# Patient Record
Sex: Female | Born: 1965 | Race: White | Hispanic: No | Marital: Married | Smoking: Never smoker
Health system: Southern US, Community
[De-identification: ages and names within clinical notes are randomized; demographics above are authoritative.]

## PROBLEM LIST (undated history)

## (undated) DIAGNOSIS — IMO0002 Reserved for concepts with insufficient information to code with codable children: Secondary | ICD-10-CM

## (undated) DIAGNOSIS — F99 Mental disorder, not otherwise specified: Secondary | ICD-10-CM

## (undated) DIAGNOSIS — G4730 Sleep apnea, unspecified: Secondary | ICD-10-CM

## (undated) DIAGNOSIS — F419 Anxiety disorder, unspecified: Secondary | ICD-10-CM

## (undated) DIAGNOSIS — R112 Nausea with vomiting, unspecified: Secondary | ICD-10-CM

## (undated) DIAGNOSIS — J189 Pneumonia, unspecified organism: Secondary | ICD-10-CM

## (undated) DIAGNOSIS — N200 Calculus of kidney: Secondary | ICD-10-CM

## (undated) DIAGNOSIS — R51 Headache: Secondary | ICD-10-CM

## (undated) DIAGNOSIS — G43909 Migraine, unspecified, not intractable, without status migrainosus: Secondary | ICD-10-CM

## (undated) DIAGNOSIS — F32A Depression, unspecified: Secondary | ICD-10-CM

## (undated) DIAGNOSIS — Z8719 Personal history of other diseases of the digestive system: Secondary | ICD-10-CM

## (undated) DIAGNOSIS — N289 Disorder of kidney and ureter, unspecified: Secondary | ICD-10-CM

## (undated) DIAGNOSIS — F329 Major depressive disorder, single episode, unspecified: Secondary | ICD-10-CM

## (undated) DIAGNOSIS — I499 Cardiac arrhythmia, unspecified: Secondary | ICD-10-CM

## (undated) DIAGNOSIS — F1910 Other psychoactive substance abuse, uncomplicated: Secondary | ICD-10-CM

## (undated) DIAGNOSIS — Z9889 Other specified postprocedural states: Secondary | ICD-10-CM

## (undated) DIAGNOSIS — M545 Low back pain, unspecified: Secondary | ICD-10-CM

## (undated) HISTORY — DX: Personal history of other diseases of the digestive system: Z87.19

## (undated) HISTORY — PX: BUNIONECTOMY: SHX129

## (undated) HISTORY — PX: ABDOMINAL HYSTERECTOMY: SHX81

## (undated) HISTORY — PX: VAGINAL HYSTERECTOMY: SHX2639

## (undated) HISTORY — PX: COLONOSCOPY: SHX174

## (undated) HISTORY — DX: Other psychoactive substance abuse, uncomplicated: F19.10

## (undated) HISTORY — PX: TOTAL ABDOMINAL HYSTERECTOMY W/ BILATERAL SALPINGOOPHORECTOMY: SHX83

## (undated) HISTORY — PX: FOOT SURGERY: SHX648

## (undated) HISTORY — PX: CHOLECYSTECTOMY: SHX55

## (undated) HISTORY — DX: Low back pain, unspecified: M54.50

---

## 2002-10-23 ENCOUNTER — Emergency Department (HOSPITAL_COMMUNITY): Admission: EM | Admit: 2002-10-23 | Discharge: 2002-10-23 | Payer: Self-pay | Admitting: *Deleted

## 2002-10-23 ENCOUNTER — Encounter: Payer: Self-pay | Admitting: Emergency Medicine

## 2002-10-29 ENCOUNTER — Emergency Department (HOSPITAL_COMMUNITY): Admission: EM | Admit: 2002-10-29 | Discharge: 2002-10-29 | Payer: Self-pay | Admitting: Emergency Medicine

## 2002-11-02 ENCOUNTER — Emergency Department (HOSPITAL_COMMUNITY): Admission: EM | Admit: 2002-11-02 | Discharge: 2002-11-02 | Payer: Self-pay | Admitting: Emergency Medicine

## 2002-11-28 ENCOUNTER — Emergency Department (HOSPITAL_COMMUNITY): Admission: EM | Admit: 2002-11-28 | Discharge: 2002-11-29 | Payer: Self-pay | Admitting: Emergency Medicine

## 2005-07-01 LAB — HM COLONOSCOPY: HM Colonoscopy: NORMAL

## 2009-03-05 ENCOUNTER — Ambulatory Visit: Payer: Self-pay | Admitting: Diagnostic Radiology

## 2009-03-05 ENCOUNTER — Emergency Department (HOSPITAL_BASED_OUTPATIENT_CLINIC_OR_DEPARTMENT_OTHER): Admission: EM | Admit: 2009-03-05 | Discharge: 2009-03-05 | Payer: Self-pay | Admitting: Emergency Medicine

## 2009-05-29 LAB — CONVERTED CEMR LAB: Pap Smear: NORMAL

## 2009-07-16 ENCOUNTER — Inpatient Hospital Stay (HOSPITAL_COMMUNITY): Admission: RE | Admit: 2009-07-16 | Discharge: 2009-07-18 | Payer: Self-pay | Admitting: Internal Medicine

## 2009-07-16 ENCOUNTER — Ambulatory Visit: Payer: Self-pay | Admitting: Diagnostic Radiology

## 2009-07-16 ENCOUNTER — Encounter: Payer: Self-pay | Admitting: Emergency Medicine

## 2009-07-17 ENCOUNTER — Encounter (INDEPENDENT_AMBULATORY_CARE_PROVIDER_SITE_OTHER): Payer: Self-pay | Admitting: Internal Medicine

## 2009-08-02 ENCOUNTER — Emergency Department (HOSPITAL_COMMUNITY): Admission: EM | Admit: 2009-08-02 | Discharge: 2009-08-02 | Payer: Self-pay | Admitting: Emergency Medicine

## 2009-08-02 ENCOUNTER — Emergency Department (HOSPITAL_BASED_OUTPATIENT_CLINIC_OR_DEPARTMENT_OTHER): Admission: EM | Admit: 2009-08-02 | Discharge: 2009-08-02 | Payer: Self-pay | Admitting: Emergency Medicine

## 2009-09-03 ENCOUNTER — Telehealth: Payer: Self-pay | Admitting: Internal Medicine

## 2009-09-03 ENCOUNTER — Ambulatory Visit: Payer: Self-pay | Admitting: Internal Medicine

## 2009-09-03 DIAGNOSIS — G4700 Insomnia, unspecified: Secondary | ICD-10-CM | POA: Insufficient documentation

## 2009-09-03 DIAGNOSIS — R519 Headache, unspecified: Secondary | ICD-10-CM | POA: Insufficient documentation

## 2009-09-03 DIAGNOSIS — R51 Headache: Secondary | ICD-10-CM

## 2009-09-25 ENCOUNTER — Ambulatory Visit: Payer: Self-pay | Admitting: Internal Medicine

## 2009-09-25 DIAGNOSIS — R002 Palpitations: Secondary | ICD-10-CM | POA: Insufficient documentation

## 2009-09-27 ENCOUNTER — Telehealth: Payer: Self-pay | Admitting: Internal Medicine

## 2009-09-28 ENCOUNTER — Encounter: Payer: Self-pay | Admitting: Internal Medicine

## 2009-09-28 LAB — CONVERTED CEMR LAB
Ferritin: 19 ng/mL (ref 10–291)
Saturation Ratios: 11 % — ABNORMAL LOW (ref 20–55)
TIBC: 367 ug/dL (ref 250–470)
UIBC: 325 ug/dL

## 2009-10-05 DIAGNOSIS — E870 Hyperosmolality and hypernatremia: Secondary | ICD-10-CM

## 2009-10-05 DIAGNOSIS — R079 Chest pain, unspecified: Secondary | ICD-10-CM

## 2009-10-05 DIAGNOSIS — Z87898 Personal history of other specified conditions: Secondary | ICD-10-CM

## 2009-10-05 DIAGNOSIS — E876 Hypokalemia: Secondary | ICD-10-CM

## 2009-11-29 ENCOUNTER — Ambulatory Visit (HOSPITAL_BASED_OUTPATIENT_CLINIC_OR_DEPARTMENT_OTHER): Admission: RE | Admit: 2009-11-29 | Discharge: 2009-11-29 | Payer: Self-pay | Admitting: Internal Medicine

## 2009-11-29 ENCOUNTER — Telehealth: Payer: Self-pay | Admitting: Internal Medicine

## 2009-11-29 ENCOUNTER — Ambulatory Visit: Payer: Self-pay | Admitting: Diagnostic Radiology

## 2009-11-29 ENCOUNTER — Ambulatory Visit: Payer: Self-pay | Admitting: Internal Medicine

## 2009-12-12 ENCOUNTER — Emergency Department (HOSPITAL_BASED_OUTPATIENT_CLINIC_OR_DEPARTMENT_OTHER): Admission: EM | Admit: 2009-12-12 | Discharge: 2009-12-12 | Payer: Self-pay | Admitting: Emergency Medicine

## 2009-12-12 ENCOUNTER — Ambulatory Visit: Payer: Self-pay | Admitting: Diagnostic Radiology

## 2009-12-18 ENCOUNTER — Encounter: Payer: Self-pay | Admitting: Cardiology

## 2010-01-03 ENCOUNTER — Encounter (INDEPENDENT_AMBULATORY_CARE_PROVIDER_SITE_OTHER): Payer: Self-pay | Admitting: *Deleted

## 2010-01-29 ENCOUNTER — Emergency Department (HOSPITAL_COMMUNITY)
Admission: EM | Admit: 2010-01-29 | Discharge: 2010-01-29 | Payer: Self-pay | Source: Home / Self Care | Admitting: Emergency Medicine

## 2010-02-28 ENCOUNTER — Emergency Department (HOSPITAL_BASED_OUTPATIENT_CLINIC_OR_DEPARTMENT_OTHER)
Admission: EM | Admit: 2010-02-28 | Discharge: 2010-03-01 | Payer: Self-pay | Source: Home / Self Care | Admitting: Emergency Medicine

## 2010-03-28 ENCOUNTER — Encounter
Admission: RE | Admit: 2010-03-28 | Discharge: 2010-04-16 | Payer: Self-pay | Source: Home / Self Care | Attending: Sports Medicine | Admitting: Sports Medicine

## 2010-04-14 LAB — CONVERTED CEMR LAB
BUN: 12 mg/dL (ref 6–23)
CO2: 25 meq/L (ref 19–32)
CO2: 27 meq/L (ref 19–32)
Calcium: 9.6 mg/dL (ref 8.4–10.5)
Chloride: 104 meq/L (ref 96–112)
Chloride: 105 meq/L (ref 96–112)
Creatinine, Ser: 0.89 mg/dL (ref 0.40–1.20)
Creatinine, Ser: 0.91 mg/dL (ref 0.40–1.20)
Free T4: 1.17 ng/dL (ref 0.80–1.80)
HCT: 34.7 % — ABNORMAL LOW (ref 36.0–46.0)
HCT: 39.2 % (ref 36.0–46.0)
Hemoglobin: 11.6 g/dL — ABNORMAL LOW (ref 12.0–15.0)
MCV: 83.9 fL (ref 78.0–100.0)
Platelets: 170 10*3/uL (ref 150–400)
Potassium: 4 meq/L (ref 3.5–5.3)
RBC: 4 M/uL (ref 3.87–5.11)
RDW: 12.2 % (ref 11.5–15.5)
RDW: 12.9 % (ref 11.5–15.5)
Sodium: 140 meq/L (ref 135–145)
WBC: 3.9 10*3/uL — ABNORMAL LOW (ref 4.0–10.5)
WBC: 4.5 10*3/uL (ref 4.0–10.5)

## 2010-04-16 NOTE — Progress Notes (Signed)
Summary: FAXED REQUEST FOR MEDICAL RECORDS TO DR August Saucer   Phone Note Outgoing Call   Call placed by: [facility] Call placed to: DR DEAN  Summary of Call: FAXED REQUEST FOR MEDICAL RECORDS  Initial call taken by: Roselle Locus,  September 03, 2009 2:53 PM

## 2010-04-16 NOTE — Progress Notes (Signed)
Summary: Lab Results  Phone Note Outgoing Call   Summary of Call: call pt - she has mild anemia.  other labs (electrolytes, kidney function and thyroid function test is normal)  (call lab and see if they can add iron studies - serum iron, tibc, and serum ferritin, b12 )  use 285.9 Initial call taken by: D. Thomos Lemons DO,  September 27, 2009 6:03 PM  Follow-up for Phone Call        call placed to [facility] customer service , spoke with Reola Calkins test have been added. Call placed to patient at [phone], no answer, voice recording stating messages/call can be accepted at this time, call disconnected Follow-up by: Glendell Docker CMA,  September 28, 2009 8:50 AM

## 2010-04-16 NOTE — Assessment & Plan Note (Signed)
Summary: new to est/mhf   Vital Signs:  Patient profile:   45 year old female Height:      65 inches Weight:      199.75 pounds BMI:     33.36 O2 Sat:      97 % on Room air Temp:     98.3 degrees F oral Pulse rate:   75 / minute Pulse rhythm:   regular Resp:     18 per minute BP sitting:   100 / 68  (right arm) Cuff size:   large  Vitals Entered By: Glendell Docker CMA (September 03, 2009 1:33 PM)  O2 Flow:  Room air CC: Rm 2- New patient Is Patient Diabetic? No Pain Assessment Patient in pain? no      Comments discuss sleeping problems, trouble falling and staying asleep, stopped taking bipolar meds after 6 years   Primary Care Provider:  Dondra Spry DO  CC:  Rm 2- New patient.  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  sleep is worse.  lost 20 lbs after stopping seroquel she prev on lithium, lamictal and zyprexa  ob gyn - interested in biodentical  Preventive Screening-Counseling & Management  Alcohol-Tobacco     Alcohol drinks/day: 0     Smoking Status: never  Caffeine-Diet-Exercise     Caffeine use/day: 2 -3 beverages daily     Does Patient Exercise: no  Allergies (verified): No Known Drug Allergies  Past History:  Past Medical History: Depression - Bipolar (prev followed by psych in [facility], [place]  Dr. 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   Head:  normocephalic and atraumatic.   Ears:  R ear normal and L ear normal.   Mouth:  pharynx pink and moist.   Neck:  No deformities, masses, or tenderness noted.no carotid bruits.   Lungs:  normal respiratory effort and normal breath sounds.   Heart:  normal rate, regular rhythm, and no gallop.   Abdomen:  soft, non-tender, normal bowel sounds, and no masses.   Extremities:  No lower extremity edema Neurologic:  cranial nerves II-XII intact and gait normal.   Psych:  normally interactive, good eye contact, not anxious appearing, and not depressed appearing.     Impression & Recommendations:  Problem # 1:  OTHER AND UNSPECIFIED BIPOLAR DISORDERS (ICD-296.89) hx of bipolar.  refer to psych for f/u Orders: Psychiatric Referral (Psych)  Problem # 2:  INSOMNIA, CHRONIC (ICD-307.42) insomnia may be related to menopause. trial of low  dose estrogen patch and gabapentin  Problem # 3:  FAMILY HISTORY BREAST CANCER 1ST DEGREE RELATIVE <50 (ICD-V16.3)  Orders: Mammogram (Screening) (Mammo)  Complete Medication List: 1)  Gabapentin 100 Mg Caps (Gabapentin) .... One by mouth qhs 2)  Estradiol 0.025 Mg/24hr Ptwk (Estradiol) .... Apply new patch once weekly  Patient Instructions: 1)  Please schedule a follow-up appointment in 1 month. Prescriptions: ESTRADIOL 0.025 MG/24HR PTWK (ESTRADIOL) apply new patch once weekly  #4 x 0   Entered and Authorized by:   D. Thomos Lemons DO   Signed by:   D. Thomos Lemons DO on 09/03/2009   Method used:   Electronically to          [id number]* (retail)       [address]       [place], [place]  [address]       Ph: [phone]       Fax: [phone]   RxID:   [id number] GABAPENTIN 100 MG CAPS (GABAPENTIN) one by mouth qhs  #30 x 0   Entered and Authorized by:   D. Thomos Lemons DO   Signed by:   D. Thomos Lemons DO on 09/03/2009   Method used:   Print then Give to Patient   RxID:   [id number]   Current Allergies (reviewed today): No known allergies    Preventive Care Screening  Pap Smear:    Date:  05/29/2009    Results:  normal   Colonoscopy:    Date:  07/01/2005    Results:  normal   Last Tetanus Booster:    Date:  08/30/2002    Results:  Historical

## 2010-04-16 NOTE — Miscellaneous (Signed)
  Clinical Lists Changes  Observations: Added new observation of PAST MED HX: CHEST PAIN EF 50-55%   echo... May, 2011 Mitral regurgitation   mild... echo... May, 2011 PALPITATIONS, OCCASIONAL MIGRAINES, HX OF HYPOKALEMIA  HYPERNATREMIA   HOT FLASHES  INSOMNIA, CHRONIC  OTHER AND UNSPECIFIED BIPOLAR DISORDERS  FAMILY HISTORY DIABETES 1ST DEGREE RELATIVE  FAMILY HISTORY OF COLON CA 1ST DEGREE RELATIVE <60  FAMILY HISTORY BREAST CANCER 1ST DEGREE RELATIVE <50 Depression - Bipolar (prev followed by psych in [facility], [place]  Dr. Chriss Czar) Migraine Headaches   (12/18/2009 16:26) Added new observation of PRIMARY MD: Dondra Spry DO (12/18/2009 16:26)       Past History:  Past Medical History: CHEST PAIN EF 50-55%   echo... May, 2011 Mitral regurgitation   mild... echo... May, 2011 PALPITATIONS, OCCASIONAL MIGRAINES, HX OF HYPOKALEMIA  HYPERNATREMIA   HOT FLASHES  INSOMNIA, CHRONIC  OTHER AND UNSPECIFIED BIPOLAR DISORDERS  FAMILY HISTORY DIABETES 1ST DEGREE RELATIVE  FAMILY HISTORY OF COLON CA 1ST DEGREE RELATIVE <60  FAMILY HISTORY BREAST CANCER 1ST DEGREE RELATIVE <50 Depression - Bipolar (prev followed by psych in [facility], [place]  Dr. Chriss Czar) Migraine Headaches

## 2010-04-16 NOTE — Letter (Signed)
   [place] at [facility] [address] [place], [place]  [address]  [place] Phone: [phone]      September 28, 2009   [place] [address] [place], [place] [address]  RE:  LAB RESULTS  Dear  Ms. Marcelli,  The following is an interpretation of your most recent lab tests.  Please take note of any instructions provided or changes to medications that have resulted from your lab work.  ELECTROLYTES:  Good - no changes needed  KIDNEY FUNCTION TESTS:  Good - no changes needed  THYROID STUDIES:  Thyroid studies normal TSH: 2.214     CBC:  Fair - review at your next visit  Your have mild anemia.  I suggest you take multivitamin that contains iron once daily.     I will further discuss your lab results at your next follow up appointment.     Sincerely Yours,    Dr. Thomos Lemons

## 2010-04-16 NOTE — Letter (Signed)
Summary: Appointment - Missed  [facility], Main Office  [address]   [place], [place] [address]   Phone: [phone]  Fax: [phone]     January 03, 2010 MRN: [id number]   [facility] [address] [place], [place]  [address]   Dear Ms. Auguste,  Our records indicate you missed your appointment on 12/20/09 with Dr. Myrtis Ser .It is very important that we reach you to reschedule this appointment. We look forward to participating in your health care needs. Please contact us at the number listed above at your earliest convenience to reschedule this appointment.     Sincerely,    Glass blower/designer

## 2010-04-16 NOTE — Progress Notes (Signed)
Summary: lab results  Phone Note Outgoing Call   Summary of Call: call pt - blood tests (blood counts, D Dimer (blood clot test),  electrolytes and kidney function is normal Initial call taken by: D. Thomos Lemons DO,  November 29, 2009 3:16 PM  Follow-up for Phone Call        Pt informed. Nicki Guadalajara Fergerson CMA Duncan Dull)  November 29, 2009 5:04 PM

## 2010-04-16 NOTE — Assessment & Plan Note (Signed)
Summary: 1 month follow up/mhf   Pt's husband NP @ 1:30pm  having car ...   Vital Signs:  Patient profile:   45 year old female Weight:      209.50 pounds BMI:     34.99 O2 Sat:      97 % on Room air Temp:     97.7 degrees F oral Pulse rate:   75 / minute Pulse rhythm:   regular Resp:     16 per minute BP sitting:   100 / 60  (right arm) Cuff size:   large  Vitals Entered By: Glendell Docker CMA (September 25, 2009 1:17 PM)  O2 Flow:  Room air CC: Rm 3- 1 Month Follow up , Palpitations Is Patient Diabetic? No Comments exercising 2-3 times a day, she states she loose her breath form time to time   Primary Care Provider:  D. Thomos Lemons DO  CC:  Rm 3- 1 Month Follow up  and Palpitations.  History of Present Illness: 45 year old white female for followup hot flashes - used estrogen patch.  she felt "fluish" feeling and stopped. gabapentin helping with hot flashes but still having issues with insomnia  Palpitations      This is a 45 year old woman who presents with Palpitations.  The patient reports dizziness and shortness of breath, but denies chest pain.  The palpitations are described as a sensation of the heart skipping beats.  The palpitations are intermittent.  not very frequent but symptoms are exertional. gets winded easily.  drinks sweet tea and Dr. Reino Kent  cardiac w/u completed in 07/2009 for pre syncope in hospital. 2 D Echo completed - diastolic dysfunction no stress test   Preventive Screening-Counseling & Management  Alcohol-Tobacco     Smoking Status: never  EKG  Procedure date:  09/25/2009  Findings:      Normal sinus rhythm with rate of:  63 bpm  Allergies (verified): No Known Drug Allergies  Past History:  Past Medical History: Depression - Bipolar (prev followed by psych in [facility], [place]  Dr. 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   Head:  normocephalic and atraumatic.   Neck:  No deformities, masses, or tenderness noted. Lungs:  normal respiratory effort and normal breath sounds.   Heart:  normal rate, regular rhythm, no murmur, and no gallop.   Extremities:  No lower extremity edema Neurologic:  cranial nerves II-XII intact and gait normal.   Psych:  normally interactive, good eye contact, not anxious appearing, and not depressed appearing.     Impression & Recommendations:  Problem # 1:  PALPITATIONS, OCCASIONAL (ICD-785.1) 45 y/o with intermittent palpitations.  symptms are exertiona.  check thyroid studies.  refer to cardiology for holter vs event recorder  Orders: T-Basic Metabolic Panel [phone]) T-CBC No Diff ([id number]) T-BNP  (B Natriuretic Peptide) [phone]) T-TSH [phone]) T-T4, Free [phone]) EKG w/  Interpretation (93000) Cardiology Referral (Cardiology)  Problem # 2:  INSOMNIA, CHRONIC (ICD-307.42) trazadone may be contributing to palpitations.  taper dose.   use higher dose of gabapentin  Complete Medication List: 1)  Gabapentin 100 Mg Caps (Gabapentin) .... One to three caps by mouth at bedtime 2)  Trazodone Hcl 100 Mg Tabs (Trazodone hcl) .... 2 tablet by mouth at bedtime  Patient Instructions: 1)  Please schedule a follow-up appointment in 2 months. Prescriptions: TRAZODONE HCL 100 MG TABS (TRAZODONE HCL) 2 tablet by mouth at bedtime  #30 x 0   Entered and  Authorized by:   D. Thomos Lemons DO   Signed by:   D. Thomos Lemons DO on 09/25/2009   Method used:   Electronically to        [id number]* (retail)       [address]       [place], [place]  [address]       Ph: [phone]       Fax: [phone]   RxID:   [id number] GABAPENTIN 100 MG CAPS (GABAPENTIN) one to three caps by mouth at bedtime  #90 x 2   Entered and Authorized by:   D. Thomos Lemons DO   Signed by:   D. Thomos Lemons DO on 09/25/2009   Method used:   Electronically to        [facility] #[id number]* (retail)       [address]       [place], [place]  [address]       Ph: [phone]       Fax: [phone]   RxID:   [id number]   Current Allergies (reviewed today): No known allergies

## 2010-04-16 NOTE — Assessment & Plan Note (Signed)
Summary: doesn't feel well/dt--Rm 3   Vital Signs:  Patient profile:   45 year old female Height:      65 inches Weight:      193.50 pounds BMI:     32.32 O2 Sat:      100 % on Room air Temp:     97.9 degrees F oral Pulse rate:   84 / minute Pulse rhythm:   regular Resp:     16 per minute BP sitting:   110 / 70  (right arm) Cuff size:   large  Vitals Entered By: Mervin Kung CMA Duncan Dull) (November 29, 2009 8:43 AM)  O2 Flow:  Room air CC: Rm 3   Also having chest pressure and dizziness x 2 weeks. Is Patient Diabetic? No Comments Pt states we took her off Trazadone. Only takes Gabapentin. Nicki Guadalajara Fergerson CMA Duncan Dull)  November 29, 2009 8:47 AM    Primary Care Provider:  Dondra Spry DO  CC:  Rm 3   Also having chest pressure and dizziness x 2 weeks.Marland Kitchen  History of Present Illness: 45 y/o white female c/o chest pressure " I have this pressure sensation that comes and goes".  some shortness of breath increased over the last 2 weeks  her symptoms are exertional. she reports symptoms while moving pallets / lifting no cough or wheezing to suggest infection no recent URI  she was prev referred to Dr. Jens Som but cancelled 3 appts    Preventive Screening-Counseling & Management  Alcohol-Tobacco     Alcohol drinks/day: 0     Smoking Status: never  Allergies (verified): No Known Drug Allergies  Past History:  Past Medical History: CHEST PAIN PALPITATIONS, OCCASIONAL MIGRAINES, HX OF HYPOKALEMIA  HYPERNATREMIA   HOT FLASHES  INSOMNIA, CHRONIC  OTHER AND UNSPECIFIED BIPOLAR DISORDERS  FAMILY HISTORY DIABETES 1ST DEGREE RELATIVE  FAMILY HISTORY OF COLON CA 1ST DEGREE RELATIVE <60  FAMILY HISTORY BREAST CANCER 1ST DEGREE RELATIVE <50 Depression - Bipolar (prev followed by psych in [facility], [place]  Dr. 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   Head:  normocephalic and atraumatic.   Eyes:  pupils equal, pupils round, and pupils reactive to light.   Mouth:  pharynx pink and moist.   Neck:  No deformities, masses, or tenderness noted.no carotid bruits.   Lungs:  normal respiratory effort, normal breath sounds, no crackles, and no wheezes.   Heart:  normal rate, regular rhythm, no murmur, and no gallop.   Abdomen:  soft, non-tender, normal bowel sounds, and no masses.   Extremities:  trace left pedal edema and trace right pedal edema.  no calf tenderness Neurologic:  cranial nerves II-XII intact.   Skin:  warm, dry Psych:  normally interactive, good eye contact, not anxious appearing, and not depressed appearing.  Impression & Recommendations:  Problem # 1:  CHEST PAIN (ICD-786.50) Pt canceled / rescheduled 3 prev cardiology referrals. pt had similar symptoms in May, 2011.  CT angio of chest was negative. rule out cardiac ischemia.  question anxiety EKG - NSR at 70 bpm Pt strongly urged to complete cardiac workup we discussed risks of CAD - including death  Orders: T-2 View CXR, Same Day (71020.5TC) T-Basic Metabolic Panel ([id number]) T-CBC No Diff ([id number]) T- * Misc. Laboratory test [id number]) Cardiology Referral (Cardiology)  Complete Medication List: 1)  Gabapentin 100 Mg Caps (Gabapentin) .... One to three caps by mouth at bedtime  Patient  Instructions: 1)  Our office will contact you re: cardiology referral 2)  If your symptoms get worse, please go to the emergency room.  Current Allergies (reviewed today): No known allergies

## 2010-05-28 LAB — CBC
HCT: 39.5 % (ref 36.0–46.0)
Hemoglobin: 13.8 g/dL (ref 12.0–15.0)
MCV: 85.3 fL (ref 78.0–100.0)
WBC: 6.6 10*3/uL (ref 4.0–10.5)

## 2010-05-28 LAB — URINALYSIS, ROUTINE W REFLEX MICROSCOPIC
Bilirubin Urine: NEGATIVE
Glucose, UA: NEGATIVE mg/dL
Hgb urine dipstick: NEGATIVE
Nitrite: NEGATIVE
Specific Gravity, Urine: 1.017 (ref 1.005–1.030)
pH: 8 (ref 5.0–8.0)

## 2010-05-28 LAB — BASIC METABOLIC PANEL
Chloride: 106 mEq/L (ref 96–112)
GFR calc non Af Amer: >60 mL/min (ref >60)
Potassium: 3.9 mEq/L (ref 3.5–5.1)
Sodium: 141 mEq/L (ref 135–145)

## 2010-05-28 LAB — D-DIMER, QUANTITATIVE: D-Dimer, Quant: <0.22 ug/mL-FEU (ref 0.00–0.48)

## 2010-05-28 LAB — URINE MICROSCOPIC-ADD ON

## 2010-05-28 LAB — POCT CARDIAC MARKERS
CKMB, poc: <1 ng/mL — ABNORMAL LOW (ref 1.0–8.0)
Troponin i, poc: <0.05 ng/mL (ref 0.00–0.09)

## 2010-05-28 LAB — DIFFERENTIAL
Basophils Absolute: 0 10*3/uL (ref 0.0–0.1)
Eosinophils Relative: 0 % (ref 0–5)
Lymphocytes Relative: 20 % (ref 12–46)
Lymphs Abs: 1.3 10*3/uL (ref 0.7–4.0)
Monocytes Absolute: 0.3 10*3/uL (ref 0.1–1.0)
Monocytes Relative: 5 % (ref 3–12)
Neutro Abs: 5 10*3/uL (ref 1.7–7.7)

## 2010-05-28 LAB — URINE CULTURE

## 2010-05-30 LAB — BASIC METABOLIC PANEL
BUN: 15 mg/dL (ref 6–23)
Calcium: 9.2 mg/dL (ref 8.4–10.5)
GFR calc non Af Amer: >60 mL/min (ref >60)
Glucose, Bld: 109 mg/dL — ABNORMAL HIGH (ref 70–99)
Sodium: 142 mEq/L (ref 135–145)

## 2010-05-30 LAB — POCT CARDIAC MARKERS: Myoglobin, poc: 26.3 ng/mL (ref 12–200)

## 2010-05-30 LAB — DIFFERENTIAL
Basophils Absolute: 0 10*3/uL (ref 0.0–0.1)
Basophils Relative: 1 % (ref 0–1)
Neutro Abs: 3.3 10*3/uL (ref 1.7–7.7)
Neutrophils Relative %: 62 % (ref 43–77)

## 2010-05-30 LAB — CBC
Hemoglobin: 12.6 g/dL (ref 12.0–15.0)
MCHC: 33.6 g/dL (ref 30.0–36.0)
RDW: 11.9 % (ref 11.5–15.5)

## 2010-06-04 LAB — COMPREHENSIVE METABOLIC PANEL
ALT: 42 U/L — ABNORMAL HIGH (ref 0–35)
AST: 26 U/L (ref 0–37)
Albumin: 3.7 g/dL (ref 3.5–5.2)
Alkaline Phosphatase: 63 U/L (ref 39–117)
BUN: 15 mg/dL (ref 6–23)
Chloride: 108 mEq/L (ref 96–112)
GFR calc Af Amer: >60 mL/min (ref >60)
Potassium: 3.4 mEq/L — ABNORMAL LOW (ref 3.5–5.1)
Sodium: 142 mEq/L (ref 135–145)
Total Protein: 5.9 g/dL — ABNORMAL LOW (ref 6.0–8.3)

## 2010-06-04 LAB — BASIC METABOLIC PANEL
BUN: 16 mg/dL (ref 6–23)
CO2: 29 mEq/L (ref 19–32)
Calcium: 9.3 mg/dL (ref 8.4–10.5)
Creatinine, Ser: 0.8 mg/dL (ref 0.4–1.2)
Creatinine, Ser: 0.83 mg/dL (ref 0.4–1.2)
GFR calc non Af Amer: >60 mL/min (ref >60)
Glucose, Bld: 128 mg/dL — ABNORMAL HIGH (ref 70–99)
Glucose, Bld: 97 mg/dL (ref 70–99)
Potassium: 3.8 mEq/L (ref 3.5–5.1)

## 2010-06-04 LAB — HEPATIC FUNCTION PANEL
AST: 32 U/L (ref 0–37)
Albumin: 3.8 g/dL (ref 3.5–5.2)
Total Bilirubin: 0.2 mg/dL — ABNORMAL LOW (ref 0.3–1.2)
Total Protein: 6.1 g/dL (ref 6.0–8.3)

## 2010-06-04 LAB — CARDIAC PANEL(CRET KIN+CKTOT+MB+TROPI)
CK, MB: 1.6 ng/mL (ref 0.3–4.0)
CK, MB: 3.3 ng/mL (ref 0.3–4.0)
Total CK: 108 U/L (ref 7–177)
Total CK: 143 U/L (ref 7–177)
Total CK: 194 U/L — ABNORMAL HIGH (ref 7–177)
Troponin I: 0.01 ng/mL (ref 0.00–0.06)

## 2010-06-04 LAB — CBC
HCT: 32.4 % — ABNORMAL LOW (ref 36.0–46.0)
MCHC: 34.1 g/dL (ref 30.0–36.0)
MCV: 87.5 fL (ref 78.0–100.0)
Platelets: 139 10*3/uL — ABNORMAL LOW (ref 150–400)
Platelets: 158 10*3/uL (ref 150–400)
RDW: 12.5 % (ref 11.5–15.5)
RDW: 13.3 % (ref 11.5–15.5)
RDW: 13.3 % (ref 11.5–15.5)
WBC: 4.8 10*3/uL (ref 4.0–10.5)

## 2010-06-04 LAB — LIPID PANEL
HDL: 44 mg/dL (ref >39)
LDL Cholesterol: 107 mg/dL — ABNORMAL HIGH (ref 0–99)
Total CHOL/HDL Ratio: 3.8 RATIO
Triglycerides: 84 mg/dL (ref <150)
VLDL: 17 mg/dL (ref 0–40)

## 2010-06-04 LAB — GLUCOSE, CAPILLARY: Glucose-Capillary: 113 mg/dL — ABNORMAL HIGH (ref 70–99)

## 2010-06-04 LAB — POCT CARDIAC MARKERS: Troponin i, poc: <0.05 ng/mL (ref 0.00–0.09)

## 2010-06-04 LAB — LIPASE, BLOOD: Lipase: 31 U/L (ref 11–59)

## 2010-06-04 LAB — D-DIMER, QUANTITATIVE: D-Dimer, Quant: <0.22 ug/mL-FEU (ref 0.00–0.48)

## 2010-06-17 LAB — URINE MICROSCOPIC-ADD ON

## 2010-06-17 LAB — CBC
Hemoglobin: 12.7 g/dL (ref 12.0–15.0)
RBC: 4.24 MIL/uL (ref 3.87–5.11)
RDW: 12 % (ref 11.5–15.5)

## 2010-06-17 LAB — URINALYSIS, ROUTINE W REFLEX MICROSCOPIC
Bilirubin Urine: NEGATIVE
Glucose, UA: NEGATIVE mg/dL
Ketones, ur: NEGATIVE mg/dL
pH: 5.5 (ref 5.0–8.0)

## 2010-06-17 LAB — BASIC METABOLIC PANEL
Calcium: 9.2 mg/dL (ref 8.4–10.5)
GFR calc Af Amer: >60 mL/min (ref >60)
GFR calc non Af Amer: >60 mL/min (ref >60)
Sodium: 142 mEq/L (ref 135–145)

## 2010-06-17 LAB — DIFFERENTIAL
Basophils Absolute: 0 10*3/uL (ref 0.0–0.1)
Lymphocytes Relative: 18 % (ref 12–46)
Monocytes Absolute: 0.2 10*3/uL (ref 0.1–1.0)
Monocytes Relative: 4 % (ref 3–12)
Neutro Abs: 3.3 10*3/uL (ref 1.7–7.7)

## 2010-07-24 ENCOUNTER — Emergency Department (INDEPENDENT_AMBULATORY_CARE_PROVIDER_SITE_OTHER): Payer: Medicare Other

## 2010-07-24 ENCOUNTER — Emergency Department (HOSPITAL_BASED_OUTPATIENT_CLINIC_OR_DEPARTMENT_OTHER)
Admission: EM | Admit: 2010-07-24 | Discharge: 2010-07-25 | Disposition: A | Discharge status: Home / Self Care | Payer: Medicare Other | Source: Home / Self Care | Attending: Emergency Medicine | Admitting: Emergency Medicine

## 2010-07-24 DIAGNOSIS — Z79899 Other long term (current) drug therapy: Secondary | ICD-10-CM | POA: Insufficient documentation

## 2010-07-24 DIAGNOSIS — R0602 Shortness of breath: Secondary | ICD-10-CM | POA: Insufficient documentation

## 2010-07-24 DIAGNOSIS — F319 Bipolar disorder, unspecified: Secondary | ICD-10-CM | POA: Insufficient documentation

## 2010-07-24 DIAGNOSIS — M25579 Pain in unspecified ankle and joints of unspecified foot: Secondary | ICD-10-CM | POA: Insufficient documentation

## 2010-07-24 LAB — CBC
HCT: 35.9 % — ABNORMAL LOW (ref 36.0–46.0)
MCV: 83.3 fL (ref 78.0–100.0)
RDW: 13 % (ref 11.5–15.5)
WBC: 9.5 10*3/uL (ref 4.0–10.5)

## 2010-07-24 LAB — BASIC METABOLIC PANEL
BUN: 18 mg/dL (ref 6–23)
CO2: 25 mEq/L (ref 19–32)
Chloride: 101 mEq/L (ref 96–112)
Glucose, Bld: 116 mg/dL — ABNORMAL HIGH (ref 70–99)
Potassium: 4.1 mEq/L (ref 3.5–5.1)

## 2010-07-24 LAB — DIFFERENTIAL
Eosinophils Relative: 0 % (ref 0–5)
Lymphocytes Relative: 9 % — ABNORMAL LOW (ref 12–46)
Lymphs Abs: 0.9 10*3/uL (ref 0.7–4.0)

## 2010-07-24 LAB — TROPONIN I: Troponin I: <0.3 ng/mL (ref <0.30)

## 2010-07-24 MED ORDER — IOHEXOL 350 MG/ML SOLN
80.0000 mL | Freq: Once | INTRAVENOUS | Status: AC | PRN
  Administered 2010-07-24: 80 mL via INTRAVENOUS

## 2010-07-25 ENCOUNTER — Emergency Department (HOSPITAL_BASED_OUTPATIENT_CLINIC_OR_DEPARTMENT_OTHER)
Admission: EM | Admit: 2010-07-25 | Discharge: 2010-07-25 | Disposition: A | Discharge status: Home / Self Care | Payer: Medicare Other | Source: Home / Self Care | Attending: Emergency Medicine | Admitting: Emergency Medicine

## 2010-07-25 DIAGNOSIS — R0602 Shortness of breath: Secondary | ICD-10-CM | POA: Insufficient documentation

## 2010-07-25 DIAGNOSIS — F319 Bipolar disorder, unspecified: Secondary | ICD-10-CM | POA: Insufficient documentation

## 2010-07-25 DIAGNOSIS — M25579 Pain in unspecified ankle and joints of unspecified foot: Secondary | ICD-10-CM | POA: Insufficient documentation

## 2010-07-25 DIAGNOSIS — R110 Nausea: Secondary | ICD-10-CM | POA: Insufficient documentation

## 2010-07-25 DIAGNOSIS — Z79899 Other long term (current) drug therapy: Secondary | ICD-10-CM | POA: Insufficient documentation

## 2010-07-26 ENCOUNTER — Emergency Department (HOSPITAL_COMMUNITY)
Admission: EM | Admit: 2010-07-26 | Discharge: 2010-07-26 | Disposition: A | Discharge status: Home / Self Care | Payer: Medicare Other | Attending: Emergency Medicine | Admitting: Emergency Medicine

## 2010-07-26 ENCOUNTER — Inpatient Hospital Stay (HOSPITAL_COMMUNITY)
Admission: AD | Admit: 2010-07-26 | Discharge: 2010-08-01 | DRG: 392 | Disposition: A | Discharge status: Home / Self Care | Payer: Medicare Other | Source: Ambulatory Visit | Attending: Orthopedic Surgery | Admitting: Orthopedic Surgery

## 2010-07-26 DIAGNOSIS — R0609 Other forms of dyspnea: Secondary | ICD-10-CM | POA: Insufficient documentation

## 2010-07-26 DIAGNOSIS — T40605A Adverse effect of unspecified narcotics, initial encounter: Secondary | ICD-10-CM | POA: Diagnosis present

## 2010-07-26 DIAGNOSIS — F411 Generalized anxiety disorder: Secondary | ICD-10-CM | POA: Insufficient documentation

## 2010-07-26 DIAGNOSIS — E876 Hypokalemia: Secondary | ICD-10-CM | POA: Diagnosis present

## 2010-07-26 DIAGNOSIS — M79609 Pain in unspecified limb: Secondary | ICD-10-CM | POA: Insufficient documentation

## 2010-07-26 DIAGNOSIS — F319 Bipolar disorder, unspecified: Secondary | ICD-10-CM | POA: Diagnosis present

## 2010-07-26 DIAGNOSIS — G8918 Other acute postprocedural pain: Secondary | ICD-10-CM | POA: Insufficient documentation

## 2010-07-26 DIAGNOSIS — R0989 Other specified symptoms and signs involving the circulatory and respiratory systems: Secondary | ICD-10-CM | POA: Insufficient documentation

## 2010-07-26 DIAGNOSIS — R0602 Shortness of breath: Secondary | ICD-10-CM | POA: Diagnosis present

## 2010-07-26 DIAGNOSIS — Z9889 Other specified postprocedural states: Secondary | ICD-10-CM

## 2010-07-26 DIAGNOSIS — R112 Nausea with vomiting, unspecified: Principal | ICD-10-CM | POA: Diagnosis present

## 2010-07-26 DIAGNOSIS — R079 Chest pain, unspecified: Secondary | ICD-10-CM | POA: Diagnosis present

## 2010-07-27 LAB — COMPREHENSIVE METABOLIC PANEL
ALT: 49 U/L — ABNORMAL HIGH (ref 0–35)
AST: 23 U/L (ref 0–37)
Alkaline Phosphatase: 65 U/L (ref 39–117)
CO2: 30 mEq/L (ref 19–32)
GFR calc non Af Amer: >60 mL/min (ref >60)
Glucose, Bld: 105 mg/dL — ABNORMAL HIGH (ref 70–99)
Potassium: 3.3 mEq/L — ABNORMAL LOW (ref 3.5–5.1)
Sodium: 138 mEq/L (ref 135–145)

## 2010-07-27 LAB — CBC
HCT: 30.2 % — ABNORMAL LOW (ref 36.0–46.0)
Hemoglobin: 10.3 g/dL — ABNORMAL LOW (ref 12.0–15.0)
RBC: 3.64 MIL/uL — ABNORMAL LOW (ref 3.87–5.11)
WBC: 4.6 10*3/uL (ref 4.0–10.5)

## 2010-07-30 ENCOUNTER — Inpatient Hospital Stay (HOSPITAL_COMMUNITY): Payer: Medicare Other

## 2010-07-30 LAB — CARDIAC PANEL(CRET KIN+CKTOT+MB+TROPI): CK, MB: 0.8 ng/mL (ref 0.3–4.0)

## 2010-07-31 LAB — CBC
MCH: 28.8 pg (ref 26.0–34.0)
MCHC: 35.3 g/dL (ref 30.0–36.0)
Platelets: 125 10*3/uL — ABNORMAL LOW (ref 150–400)
RDW: 12.8 % (ref 11.5–15.5)

## 2010-07-31 LAB — DIFFERENTIAL
Basophils Absolute: 0 10*3/uL (ref 0.0–0.1)
Basophils Relative: 0 % (ref 0–1)
Eosinophils Absolute: 0 10*3/uL (ref 0.0–0.7)
Eosinophils Relative: 0 % (ref 0–5)
Monocytes Absolute: 0.2 10*3/uL (ref 0.1–1.0)
Monocytes Relative: 6 % (ref 3–12)
Neutrophils Relative %: 58 % (ref 43–77)

## 2010-07-31 LAB — CARDIAC PANEL(CRET KIN+CKTOT+MB+TROPI)
Relative Index: INVALID (ref 0.0–2.5)
Total CK: 45 U/L (ref 7–177)
Troponin I: <0.3 ng/mL (ref <0.30)

## 2010-07-31 LAB — COMPREHENSIVE METABOLIC PANEL
AST: 31 U/L (ref 0–37)
BUN: 3 mg/dL — ABNORMAL LOW (ref 6–23)
CO2: 31 mEq/L (ref 19–32)
Chloride: 106 mEq/L (ref 96–112)
Creatinine, Ser: 0.56 mg/dL (ref 0.4–1.2)
GFR calc Af Amer: >60 mL/min (ref >60)
GFR calc non Af Amer: >60 mL/min (ref >60)
Glucose, Bld: 105 mg/dL — ABNORMAL HIGH (ref 70–99)
Total Bilirubin: 0.8 mg/dL (ref 0.3–1.2)

## 2010-07-31 LAB — AMYLASE: Amylase: 25 U/L (ref 0–105)

## 2010-07-31 LAB — HEMOGLOBIN A1C: Hgb A1c MFr Bld: 5.1 % (ref <5.7)

## 2010-08-01 LAB — BASIC METABOLIC PANEL
Chloride: 106 mEq/L (ref 96–112)
GFR calc non Af Amer: >60 mL/min (ref >60)
Glucose, Bld: 102 mg/dL — ABNORMAL HIGH (ref 70–99)
Potassium: 3.7 mEq/L (ref 3.5–5.1)
Sodium: 139 mEq/L (ref 135–145)

## 2010-08-19 ENCOUNTER — Emergency Department (HOSPITAL_BASED_OUTPATIENT_CLINIC_OR_DEPARTMENT_OTHER)
Admission: EM | Admit: 2010-08-19 | Discharge: 2010-08-19 | Disposition: A | Discharge status: Home / Self Care | Payer: Medicare Other | Attending: Emergency Medicine | Admitting: Emergency Medicine

## 2010-08-19 DIAGNOSIS — G8918 Other acute postprocedural pain: Secondary | ICD-10-CM | POA: Insufficient documentation

## 2010-08-19 DIAGNOSIS — F319 Bipolar disorder, unspecified: Secondary | ICD-10-CM | POA: Insufficient documentation

## 2010-08-19 DIAGNOSIS — Z79899 Other long term (current) drug therapy: Secondary | ICD-10-CM | POA: Insufficient documentation

## 2010-08-20 ENCOUNTER — Emergency Department (HOSPITAL_COMMUNITY)
Admission: EM | Admit: 2010-08-20 | Discharge: 2010-08-20 | Disposition: A | Discharge status: Home / Self Care | Payer: Medicare Other | Attending: Emergency Medicine | Admitting: Emergency Medicine

## 2010-08-20 DIAGNOSIS — G8929 Other chronic pain: Secondary | ICD-10-CM | POA: Insufficient documentation

## 2010-08-20 DIAGNOSIS — R112 Nausea with vomiting, unspecified: Secondary | ICD-10-CM | POA: Insufficient documentation

## 2010-08-20 DIAGNOSIS — Z79899 Other long term (current) drug therapy: Secondary | ICD-10-CM | POA: Insufficient documentation

## 2010-09-26 ENCOUNTER — Emergency Department (HOSPITAL_BASED_OUTPATIENT_CLINIC_OR_DEPARTMENT_OTHER)
Admission: EM | Admit: 2010-09-26 | Discharge: 2010-09-27 | Disposition: A | Discharge status: Home / Self Care | Payer: PRIVATE HEALTH INSURANCE | Attending: Emergency Medicine | Admitting: Emergency Medicine

## 2010-09-26 ENCOUNTER — Encounter: Payer: Self-pay | Admitting: *Deleted

## 2010-09-26 DIAGNOSIS — R5381 Other malaise: Secondary | ICD-10-CM | POA: Insufficient documentation

## 2010-09-26 DIAGNOSIS — R509 Fever, unspecified: Secondary | ICD-10-CM | POA: Insufficient documentation

## 2010-09-26 DIAGNOSIS — R52 Pain, unspecified: Secondary | ICD-10-CM | POA: Insufficient documentation

## 2010-09-26 DIAGNOSIS — R112 Nausea with vomiting, unspecified: Secondary | ICD-10-CM | POA: Insufficient documentation

## 2010-09-26 DIAGNOSIS — R51 Headache: Secondary | ICD-10-CM | POA: Insufficient documentation

## 2010-09-26 DIAGNOSIS — M5430 Sciatica, unspecified side: Secondary | ICD-10-CM

## 2010-09-26 DIAGNOSIS — N390 Urinary tract infection, site not specified: Secondary | ICD-10-CM | POA: Insufficient documentation

## 2010-09-26 DIAGNOSIS — R110 Nausea: Secondary | ICD-10-CM

## 2010-09-26 HISTORY — DX: Headache: R51

## 2010-09-26 LAB — URINE MICROSCOPIC-ADD ON

## 2010-09-26 LAB — URINALYSIS, ROUTINE W REFLEX MICROSCOPIC
Bilirubin Urine: NEGATIVE
Ketones, ur: NEGATIVE mg/dL
Nitrite: NEGATIVE
Urobilinogen, UA: 0.2 mg/dL (ref 0.0–1.0)
pH: 5.5 (ref 5.0–8.0)

## 2010-09-26 LAB — DIFFERENTIAL
Lymphs Abs: 1.5 10*3/uL (ref 0.7–4.0)
Monocytes Relative: 5 % (ref 3–12)
Neutro Abs: 5 10*3/uL (ref 1.7–7.7)
Neutrophils Relative %: 73 % (ref 43–77)

## 2010-09-26 LAB — CBC
Hemoglobin: 11.3 g/dL — ABNORMAL LOW (ref 12.0–15.0)
MCH: 29.2 pg (ref 26.0–34.0)
RBC: 3.87 MIL/uL (ref 3.87–5.11)

## 2010-09-26 LAB — RAPID STREP SCREEN (MED CTR MEBANE ONLY): Streptococcus, Group A Screen (Direct): NEGATIVE

## 2010-09-26 MED ORDER — KETOROLAC TROMETHAMINE 30 MG/ML IJ SOLN
30.0000 mg | Freq: Once | INTRAMUSCULAR | Status: AC
  Administered 2010-09-27: 30 mg via INTRAVENOUS
  Filled 2010-09-26: qty 1

## 2010-09-26 MED ORDER — HYDROMORPHONE HCL 1 MG/ML IJ SOLN
1.0000 mg | Freq: Once | INTRAMUSCULAR | Status: DC
  Filled 2010-09-26: qty 1

## 2010-09-26 MED ORDER — DIPHENHYDRAMINE HCL 50 MG/ML IJ SOLN
25.0000 mg | Freq: Once | INTRAMUSCULAR | Status: AC
  Administered 2010-09-27: via INTRAVENOUS
  Filled 2010-09-26: qty 1

## 2010-09-26 MED ORDER — PROMETHAZINE HCL 25 MG/ML IJ SOLN
25.0000 mg | Freq: Once | INTRAMUSCULAR | Status: AC
  Administered 2010-09-27: 25 mg via INTRAVENOUS
  Filled 2010-09-26: qty 1

## 2010-09-26 MED ORDER — SODIUM CHLORIDE 0.9 % IV SOLN
Freq: Once | INTRAVENOUS | Status: AC
  Administered 2010-09-27: 1000 mL via INTRAVENOUS

## 2010-09-26 NOTE — ED Provider Notes (Addendum)
History     Chief Complaint  Patient presents with  . Fever  . Generalized Body Aches   Patient is a 45 y.o. female presenting with fever.  Fever Primary symptoms of the febrile illness include fever, fatigue, headaches, nausea, vomiting and myalgias. Primary symptoms do not include visual change, cough, wheezing, shortness of breath, abdominal pain, diarrhea, dysuria, altered mental status or rash. The current episode started today. The problem has not changed since onset. The fever began today. The fever has been resolved since its onset. The maximum temperature recorded prior to her arrival was 100 to 100.9 F. The temperature was taken by an oral thermometer.  The fatigue began today.  The headache began today. The headache developed gradually. Headache is a new problem. The headache is present continuously. The headache is associated with photophobia. The headache is not associated with aura, double vision, eye pain, decreased vision, visual change, stiff neck, neck stiffness or paresthesias.  Nausea began today.  The vomiting began today. Vomiting occurred once. The emesis contains stomach contents.  Myalgias began today. The myalgias have been unchanged since their onset. The myalgias are generalized. The myalgias are aching. The discomfort from the myalgias is moderate. The myalgias are not associated with tenderness or swelling. Primary symptoms comment: "sciatica" - the patient reports onset of lower left sided back pain earlier today while at the grocery store sitting down on a shopping cart. She denies any preceding injury either blunt or due to lifting. She reports that she has a history of sciatica,.   the patient reports that her sciatica is the most troubling of the symptoms she presents with today. She follows this with generalized headache and nausea is her second most troublesome symptom for which she presents. She reports one episode of emesis earlier today that was yellow and  watery without blood. Otherwise she reports a sore throat, mild, since this morning when she woke up. She reports that earlier today when she saw her doctor her temperature was 100.70F. She otherwise denies rash, joint pain, or neck pain/stiffness.  Past Medical History  Diagnosis Date  . Bipolar 1 disorder   . Headache     Past Surgical History  Procedure Date  . Vaginal hysterectomy   . Cholecystectomy     No family history on file.  History  Substance Use Topics  . Smoking status: Never Smoker   . Smokeless tobacco: Not on file  . Alcohol Use: No    OB History    Grav Para Term Preterm Abortions TAB SAB Ect Mult Living                  Review of Systems  Constitutional: Positive for fever and fatigue. Negative for chills.  HENT: Positive for sore throat. Negative for congestion, rhinorrhea, trouble swallowing, neck pain, neck stiffness and voice change.   Eyes: Positive for photophobia. Negative for double vision and pain.  Respiratory: Negative for cough, chest tightness, shortness of breath and wheezing.   Cardiovascular: Negative for chest pain, palpitations and leg swelling.  Gastrointestinal: Positive for nausea and vomiting. Negative for abdominal pain, diarrhea, constipation and abdominal distention.  Genitourinary: Negative for dysuria, frequency and flank pain.  Musculoskeletal: Positive for myalgias and back pain.  Skin: Negative for pallor and rash.  Neurological: Positive for headaches. Negative for numbness and paresthesias.  Hematological: Negative for adenopathy.  Psychiatric/Behavioral: Negative for confusion and altered mental status. The patient is nervous/anxious.     Physical Exam  BP  122/66  Pulse 116  Temp(Src) 99.8 F (37.7 C) (Oral)  Resp 19  SpO2 98%  Physical Exam  Nursing note and vitals reviewed. Constitutional: She is oriented to person, place, and time. She appears well-developed and well-nourished. She appears distressed.  HENT:   Head: Normocephalic and atraumatic.  Right Ear: External ear normal.  Left Ear: External ear normal.  Nose: Nose normal.  Mouth/Throat: Oropharynx is clear and moist. No oropharyngeal exudate.  Eyes: Conjunctivae and EOM are normal. Pupils are equal, round, and reactive to light.  Neck: Normal range of motion and full passive range of motion without pain. Neck supple. No rigidity. No Brudzinski's sign noted.  Cardiovascular: Regular rhythm, S1 normal, S2 normal, normal heart sounds and intact distal pulses.  Tachycardia present.  Exam reveals no gallop and no friction rub.   No murmur heard. Pulmonary/Chest: Effort normal and breath sounds normal. No respiratory distress. She has no wheezes. She has no rales.  Abdominal: Soft. Bowel sounds are normal. She exhibits no distension. There is no tenderness. There is no guarding.  Musculoskeletal: Normal range of motion. She exhibits no edema and no tenderness.       Back:  Lymphadenopathy:    She has no cervical adenopathy.  Neurological: She is alert and oriented to person, place, and time. She has normal reflexes. No cranial nerve deficit. She exhibits normal muscle tone. Coordination normal.  Skin: Skin is warm and dry. No rash noted. She is not diaphoretic. No erythema.    ED Course  Procedures The patient reports that she is feeling much better after medications given in the ED. It appears that she has a urinary tract infection but no streptococcal pharyngitis. I will discharge her home at this time.  MDM Differential diagnosis for the patient includes sciatica, tension headache, gastroenteritis, strep throat, viral syndrome, dehydration, urinary tract infection among other etiologies considered.      Felisa Bonier, MD 09/27/10 0000  Felisa Bonier, MD 09/27/10 [phone]

## 2010-09-26 NOTE — ED Notes (Signed)
Pt. C/o general aching and fever that started today. Also c/o of "sciatica"  That started today while at the grocery store. Denies any heavy lifting. States pain radiating to left lower extremities.  Pt. States she saw her pcp today for sore throat and nausea. States vomited times one pta. C/o nausea at present. Denies any cough. resp even and unlabored. No distress.  Has not taken any meds pta.

## 2010-09-27 LAB — COMPREHENSIVE METABOLIC PANEL
Alkaline Phosphatase: 91 U/L (ref 39–117)
BUN: 10 mg/dL (ref 6–23)
CO2: 28 mEq/L (ref 19–32)
Chloride: 103 mEq/L (ref 96–112)
GFR calc Af Amer: >60 mL/min (ref >60)
Glucose, Bld: 110 mg/dL — ABNORMAL HIGH (ref 70–99)
Potassium: 4.2 mEq/L (ref 3.5–5.1)
Total Bilirubin: 0.4 mg/dL (ref 0.3–1.2)

## 2010-09-27 LAB — LIPASE, BLOOD: Lipase: 18 U/L (ref 11–59)

## 2010-09-27 MED ORDER — OXYCODONE-ACETAMINOPHEN 5-325 MG PO TABS: 2.0000 | ORAL_TABLET | ORAL | Status: AC | PRN

## 2010-09-27 MED ORDER — PROMETHAZINE HCL 25 MG PO TABS: 25.0000 mg | ORAL_TABLET | Freq: Four times a day (QID) | ORAL | Status: DC | PRN

## 2010-09-27 MED ORDER — SULFAMETHOXAZOLE-TRIMETHOPRIM 800-160 MG PO TABS: 1.0000 | ORAL_TABLET | Freq: Two times a day (BID) | ORAL | Status: AC

## 2010-09-30 NOTE — Discharge Summary (Signed)
[name], [name]                ACCOUNT NO.:  [id number]  MEDICAL RECORD NO.:  [id number]  LOCATION:                                 FACILITY:  PHYSICIAN:  Leonides Grills, M.D.     DATE OF BIRTH:  [birth year]  DATE OF ADMISSION: DATE OF DISCHARGE:                        DISCHARGE SUMMARY - REFERRING   ADMITTING DIAGNOSES: 1. Bipolar. 2. Chronic nausea. 3. Asthma. 4. Chest pain, shortness of breath.  DISCHARGE DIAGNOSES: 1. Bipolar disorder. 2. Asthma. 3. Chronic nausea. 4. Negative workup for chest pain and shortness of breath.  HISTORY OF PRESENT ILLNESS:  This is a 45 year old female with a history of bipolar disorder, history of asthma, history of nausea in the past. The patient is status post right  Kidner procedure, right FDL 2, navicular tendon transfer, right gastroc, right ankle arthroscopy with extensive debridement on Jul 17, 2010.  Since her surgery, the patient has been admitted through the ER to rule out PE and coronary artery disease.  She had full investigation of both the PE and the cardiac disease and had a negative CT angiogram for PE and 2-D echo which showed mild LVH, systolic function within normal limits, EF of 50% to 55% and was discharged home.  The patient most recently was admitted to hospital on Jul 27, 2010, for difficulty with pain control, persistent nausea and vomiting.  The patient also stated that she had had shortness of breath and chest pain.  CONSULTS:  Following consults were obtained while the patient was hospitalized:  Hospitalist consult.  HOSPITAL COURSE:  The patient was admitted on Jul 27, 2010, due to uncontrolled right lower extremity pain, nausea and vomiting. Throughout her hospital stay, the patient remained afebrile, vital signs stable.  Chest x-ray was performed and it showed some mild atelectasis. KUB was also performed and it showed no evidence of obstruction.  The patient did develop hypokalemia during the hospital  stay and potassium was replaced.  Hospital day #4, the patient was afebrile, vital signs stable, potassium 3.7, nausea and pain under control.  The patient tolerating diet well.  The patient was discharged to home.  LABORATORY DATA:  Routine labs on admission; white count of 4600, hemoglobin 10.3, hematocrit 30.2, and platelets were 150,000.  On Jul 31, 2010, CBC, white count 3400, hemoglobin 9.8, hematocrit 27.8, platelets 125.  BMET on admission, sodium 138, potassium 3.3, chloride 101, bicarb 30, glucose 105, BUN 7, creatinine 0.6.  Aug 01, 2010, sodium 139, potassium 3.7, chloride 106, bicarb 30, glucose 102, BUN 16, creatinine 0.62.  Cardiac enzymes, Jul 30, 2010, CK was 53, CK-MB 0.8, troponin less than 0.3.  RADIOGRAPHS:  Chest x-ray dated Jul 30, 2010, showed left basilar atelectasis versus infiltrate, no pleural effusion, no pulmonary edema and heart size within normal limits.  Abdominal x-ray dated Jul 30, 2010, showed mild colonic distention. Note that this portion and dilation of small bowel suggests obstruction.  EKG dated Jul 30, 2010, showed normal sinus rhythm, rate was 70, PR interval was 168 milliseconds, PRT axis is 27 negative for 3.  DISCHARGE INSTRUCTIONS:  DISCHARGE MEDICATIONS:  The patient's new meds were: 1. Colace 100 mg b.i.d. 2. Oxycodone 5/325 one  to two p.o. q.4-6 h p.r.n. 3. Protonix 40 mg p.o. daily. 4. She is to remain on aspirin, gabapentin, Phenergan, and Seroquel.  DIET:  Regular diet.  No restrictions.  Weightbearing, she is non-weightbearing on the right leg.  WOUND CARE:  She is to keep the cast clean, dry and intact.  Cast was changed during the hospital stay.  Jones dressing was removed.  Sutures were removed, Steri-Strips applied, a  new short-leg cast was applied to the patient's leg.  FOLLOWUP:  The patient is to follow up Dr. Lestine Box in 1 month.  The patient to call [phone] for appointment.  CONDITION ON DISCHARGE:  The  patient was discharged to home in good and stable condition.     Richardean Canal, P.A.   ______________________________ Leonides Grills, M.D.    GC/MEDQ  D:  08/27/2010  T:  08/27/2010  Job:  [id number]  cc:   Leonides Grills, M.D. Fax: [phone]  Electronically Signed by Richardean Canal P.A. on 09/04/2010 02:20:02 AM Electronically Signed by Leonides Grills M.D. on 09/30/2010 05:53:04 PM

## 2010-10-06 ENCOUNTER — Inpatient Hospital Stay (INDEPENDENT_AMBULATORY_CARE_PROVIDER_SITE_OTHER)
Admission: RE | Admit: 2010-10-06 | Discharge: 2010-10-06 | Disposition: A | Discharge status: Home / Self Care | Payer: PRIVATE HEALTH INSURANCE | Source: Ambulatory Visit | Attending: Family Medicine | Admitting: Family Medicine

## 2010-10-06 DIAGNOSIS — M5430 Sciatica, unspecified side: Secondary | ICD-10-CM

## 2010-10-07 ENCOUNTER — Encounter (HOSPITAL_BASED_OUTPATIENT_CLINIC_OR_DEPARTMENT_OTHER): Payer: Self-pay | Admitting: Student

## 2010-10-07 ENCOUNTER — Encounter (HOSPITAL_BASED_OUTPATIENT_CLINIC_OR_DEPARTMENT_OTHER): Payer: Self-pay | Admitting: *Deleted

## 2010-10-07 ENCOUNTER — Emergency Department (HOSPITAL_BASED_OUTPATIENT_CLINIC_OR_DEPARTMENT_OTHER)
Admission: EM | Admit: 2010-10-07 | Discharge: 2010-10-07 | Disposition: A | Discharge status: Home / Self Care | Payer: PRIVATE HEALTH INSURANCE | Attending: Emergency Medicine | Admitting: Emergency Medicine

## 2010-10-07 ENCOUNTER — Emergency Department (HOSPITAL_BASED_OUTPATIENT_CLINIC_OR_DEPARTMENT_OTHER)
Admission: EM | Admit: 2010-10-07 | Discharge: 2010-10-07 | Disposition: A | Discharge status: Home / Self Care | Payer: No Typology Code available for payment source | Attending: Emergency Medicine | Admitting: Emergency Medicine

## 2010-10-07 DIAGNOSIS — G8929 Other chronic pain: Secondary | ICD-10-CM | POA: Insufficient documentation

## 2010-10-07 DIAGNOSIS — N390 Urinary tract infection, site not specified: Secondary | ICD-10-CM | POA: Insufficient documentation

## 2010-10-07 DIAGNOSIS — F319 Bipolar disorder, unspecified: Secondary | ICD-10-CM | POA: Insufficient documentation

## 2010-10-07 DIAGNOSIS — R112 Nausea with vomiting, unspecified: Secondary | ICD-10-CM | POA: Insufficient documentation

## 2010-10-07 DIAGNOSIS — R110 Nausea: Secondary | ICD-10-CM | POA: Insufficient documentation

## 2010-10-07 LAB — URINALYSIS, ROUTINE W REFLEX MICROSCOPIC
Glucose, UA: NEGATIVE mg/dL
Hgb urine dipstick: NEGATIVE
Ketones, ur: 15 mg/dL — AB
Protein, ur: NEGATIVE mg/dL
Urobilinogen, UA: 0.2 mg/dL (ref 0.0–1.0)

## 2010-10-07 LAB — URINE MICROSCOPIC-ADD ON

## 2010-10-07 MED ORDER — ONDANSETRON HCL 4 MG PO TABS: 8.0000 mg | ORAL_TABLET | Freq: Three times a day (TID) | ORAL | Status: AC | PRN

## 2010-10-07 MED ORDER — PROMETHAZINE HCL 25 MG/ML IJ SOLN
25.0000 mg | Freq: Once | INTRAMUSCULAR | Status: AC
  Administered 2010-10-07: 25 mg via INTRAMUSCULAR
  Filled 2010-10-07: qty 1

## 2010-10-07 MED ORDER — FENTANYL CITRATE 0.05 MG/ML IJ SOLN
100.0000 ug | Freq: Once | INTRAMUSCULAR | Status: AC
  Administered 2010-10-07: 100 ug via INTRAVENOUS
  Filled 2010-10-07: qty 2

## 2010-10-07 MED ORDER — PROMETHAZINE HCL 25 MG/ML IJ SOLN
25.0000 mg | Freq: Once | INTRAMUSCULAR | Status: AC
  Administered 2010-10-07: 25 mg via INTRAVENOUS
  Filled 2010-10-07: qty 1

## 2010-10-07 MED ORDER — ONDANSETRON HCL 4 MG/2ML IJ SOLN
INTRAMUSCULAR | Status: AC
  Filled 2010-10-07: qty 2

## 2010-10-07 MED ORDER — NITROFURANTOIN MONOHYD MACRO 100 MG PO CAPS
100.0000 mg | ORAL_CAPSULE | Freq: Once | ORAL | Status: AC
  Administered 2010-10-07: 100 mg via ORAL
  Filled 2010-10-07: qty 1

## 2010-10-07 MED ORDER — ONDANSETRON HCL 4 MG/2ML IJ SOLN
4.0000 mg | Freq: Once | INTRAMUSCULAR | Status: AC
  Administered 2010-10-07: 4 mg via INTRAVENOUS

## 2010-10-07 MED ORDER — SODIUM CHLORIDE 0.9 % IV SOLN
Freq: Once | INTRAVENOUS | Status: AC
  Administered 2010-10-07: 02:00:00 via INTRAVENOUS

## 2010-10-07 MED ORDER — OXYCODONE-ACETAMINOPHEN 5-325 MG PO TABS
2.0000 | ORAL_TABLET | Freq: Once | ORAL | Status: DC
  Filled 2010-10-07: qty 2

## 2010-10-07 MED ORDER — ONDANSETRON 4 MG PO TBDP
4.0000 mg | ORAL_TABLET | Freq: Once | ORAL | Status: AC
  Administered 2010-10-07: 4 mg via ORAL
  Filled 2010-10-07: qty 1

## 2010-10-07 MED ORDER — NITROFURANTOIN MONOHYD MACRO 100 MG PO CAPS: 100.0000 mg | ORAL_CAPSULE | Freq: Two times a day (BID) | ORAL | Status: AC

## 2010-10-07 NOTE — ED Notes (Signed)
Pt returned to ED with continued s/sx of bilateral leg pain and reports hx of sciatica in both legs, and reports being unable to tolerate po pain meds from discharge r/t extreme nausea. No active s/sx of vomiting at present time. Pt in ankle boot on right leg and able to ambulate well.

## 2010-10-07 NOTE — ED Provider Notes (Signed)
History     Chief Complaint  Patient presents with  . Leg Pain  . Nausea   HPI Comments: Pt was seen in the er this morning and was treated for a uti and she states that the antibiotic is making her nauseated. Pt states that because she is so nauseated she has been unable to take her pain medication, related to the chronic foot pain from a surgery in may on her right foot  Patient is a 45 y.o. female presenting with vomiting. The history is provided by the patient. No language interpreter was used.  Emesis  This is a new problem. The current episode started 6 to 12 hours ago. The problem occurs 5 to 10 times per day. The problem has been gradually worsening. The emesis has an appearance of stomach contents. Pertinent negatives include no abdominal pain, no chills and no fever.    Past Medical History  Diagnosis Date  . Bipolar 1 disorder   . Headache   . Bipolar 1 disorder     Past Surgical History  Procedure Date  . Vaginal hysterectomy   . Cholecystectomy   . Abdominal hysterectomy     No family history on file.  History  Substance Use Topics  . Smoking status: Never Smoker   . Smokeless tobacco: Not on file  . Alcohol Use: No    OB History    Grav Para Term Preterm Abortions TAB SAB Ect Mult Living                  Review of Systems  Constitutional: Negative for fever and chills.  Gastrointestinal: Positive for vomiting. Negative for abdominal pain.  All other systems reviewed and are negative.    Physical Exam  BP 125/94  Pulse 100  Temp(Src) 98 F (36.7 C) (Oral)  Resp 22  Wt 200 lb (90.719 kg)  Physical Exam  Nursing note and vitals reviewed. Constitutional: She is oriented to person, place, and time. She appears well-developed.  HENT:  Head: Normocephalic.  Eyes: Pupils are equal, round, and reactive to light.  Neck: Normal range of motion.  Cardiovascular: Normal rate.   Pulmonary/Chest: Effort normal.  Abdominal: Soft. There is no  tenderness.  Musculoskeletal: Normal range of motion.       No swelling or redness noted to the right foot  Neurological: She is alert and oriented to person, place, and time.  Skin: Skin is warm and dry.  Psychiatric: She has a normal mood and affect.    ED Course  Procedures  MDM Will treat pts nausea and give her percocet here:pt states that the zofran didn't work and that she want phenergan and that if I don't give her narcotic pain medication then she is leaving:discussed with pt that will treat the nausea and she can have her oral pain medication if she wants,but no iv narcotics were going to be given      Teressa Lower, NP 10/07/10 1429

## 2010-10-07 NOTE — ED Provider Notes (Signed)
History     Chief Complaint  Patient presents with  . Emesis   The history is provided by the patient.   the patient is a 45 year old white female with a history of complex right foot surgery in May of this year. She is on chronic pain management including double patch and Percocet by mouth. She became nauseated about 24 hours ago. This is accompanied by general malaise. She started having symptoms of left-sided sciatica as a result of shifting her weight to the left leg as a result of her surgery. She was seen in urgent care yesterday afternoon and prescribed naproxen sodium and Flexeril. After taking these she began vomiting. She has not been able to take any of her by mouth analgesics and as a result is experiencing an exacerbation of her chronic pain, which is now moderate to severe. She denies diarrhea. She denies fever but has felt chilled. She denies urinary symptoms, but was treated for a UTI with a 3 day course of Septra about a week ago.  Past Medical History  Diagnosis Date  . Bipolar 1 disorder   . Headache   . Bipolar 1 disorder     Past Surgical History  Procedure Date  . Vaginal hysterectomy   . Cholecystectomy   . Abdominal hysterectomy     History reviewed. No pertinent family history.  History  Substance Use Topics  . Smoking status: Never Smoker   . Smokeless tobacco: Not on file  . Alcohol Use: No    OB History    Grav Para Term Preterm Abortions TAB SAB Ect Mult Living                  Review of Systems  All other systems reviewed and are negative.    Physical Exam  BP 124/85  Pulse 90  Temp(Src) 97.7 F (36.5 C) (Oral)  Resp 20  Ht 5\' 6"  (1.676 m)  Wt 200 lb (90.719 kg)  BMI 32.28 kg/m2  SpO2 100%  Physical Exam General: Well-developed, well-nourished female in no acute distress; appearance consistent with age of record HENT: normocephalic, atraumatic Eyes: pupils equal round and reactive to light; extraocular muscles intact Neck:  supple Heart: regular rate and rhythm; no murmurs, rubs or gallops Lungs: clear to auscultation bilaterally Abdomen: soft; nontender; nondistended; no masses or hepatosplenomegaly; bowel sounds present Extremities: Postoperative changes right foot with decreased range of motion of the right ankle; decreased range of motion left hip due to pain. No other deformities or limitations noted Neurologic: Awake, alert and oriented;motor function intact in all extremities and symmetric;sensation grossly intact; no facial droop Skin: Warm and dry   ED Course  Procedures  MDM 5:53 AM Nausea improved with Zofran (Phenergan, given initially, did not adequately with the nausea).      Hanley Seamen, MD 10/07/10 [phone]

## 2010-10-07 NOTE — ED Notes (Signed)
Pt states she was seen at Urgent Care on the 22 for sciatica. Given Flexeril and Anaprox which she took when she got home. Shortly afterward, she began vomiting.

## 2010-10-07 NOTE — ED Notes (Signed)
IV removed after prior discharge, just not taken out in computer.

## 2010-10-08 NOTE — ED Provider Notes (Signed)
Medical screening examination/treatment/procedure(s) were performed by non-physician practitioner and as supervising physician I was immediately available for consultation/collaboration.   Riley Lam [facility] 10/08/10 [phone]

## 2010-10-09 LAB — URINE CULTURE
Colony Count: 50000
Culture  Setup Time: 201207232347

## 2010-10-14 NOTE — Consult Note (Signed)
NAME:  [name], [name]                ACCOUNT NO.:  [id number]  MEDICAL RECORD NO.:  [id number]           PATIENT TYPE:  I  LOCATION:  1608                         FACILITY:  [facility]  PHYSICIAN:  Shaine Newmark I Baneza Bartoszek, MD      DATE OF BIRTH:  22-Jul-1965  DATE OF CONSULTATION:  07/30/2010 DATE OF DISCHARGE:                                CONSULTATION   REASON FOR CONSULTATION:  Persistent nausea, shortness of breath and chest pain.  HISTORY OF PRESENT ILLNESS:  This is a 45 year old female who has a history of bipolar disorder, history of asthma, history of nausea in the past.  She is status post recent right lower extremity surgery on Jul 17, 2010.  The patient was discharged on May 3 and admitted the same day with chest pain.  The patient was admitted to rule out PE and coronary artery disease.  The patient is status post full investigation including CT angiogram which was negative.  She had a CT angiogram which was negative for pulmonary embolism and 2-D echo which did show cavity was normal, mild LVH, systolic function within normal and EF 50% to 55%, and accordingly the patient was discharged home.  The patient was again admitted to the hospital on May 12 under the Orthopedic service secondary to difficult pain control, persistent nausea and vomiting.  We were consulted regarding persistent nausea, shortness of breath and chest pain.  I did see the patient and evaluated her.  The patient denies any shortness of breath currently or chest pain, but she is mainly concerned about persistent nausea.  She is on clear liquid diet and unable to maintain her oral nutrition secondary to persistent nausea.  She had 3 episodes of vomiting 2 days ago.  She denies any abdominal pain.  She has regular bowel movements.  The patient admitted she has a history of chronic nausea in the past, and for the nausea she is on Phenergan 25 mg p.o. q.8.  Also, she is complaining of her right leg pain.  She denies  any headache.  Denies any blurring of vision. Denies any numbness or weakness of her extremities.  PAST MEDICAL HISTORY: 1. Asthma. 2. Bipolar disorder. 3. Chronic nausea. 4. Hysterectomy and salpingo-oophorectomy. 5. Gallbladder surgery.  CURRENT MEDICATIONS: 1. Aspirin 325 twice daily. 2. Dilaudid PCA pump. 3. Neurontin 300 mg p.o. daily. 4. Seroquel 800 mg p.o. at bedtime. 5. Benadryl 12.5, 25 IV q.6 h p.r.n. 6. Dilaudid 2 to 4 mg p.o. q.4 h p.r.n. 7. Reglan 10 mg IV q.6 h p.r.n. 8. Zofran 4 mg IV q.6 h p.r.n. 9. Percocet 2 tablets q.4 h p.r.n. 10.Compazine 5 to 10 mg IV q.6 h p.r.n. 11.Phenergan 25 mg IV q.4 h p.r.n. 12.Tramadol/acetaminophen.  ALLERGIES:  No known drug allergies.  SOCIAL HISTORY:  She is married.  She lives with her husband.  She has 6 grown children.  She denies smoking or drinking alcohol or using illicit drugs.  REVIEW OF SYSTEMS:  As per HPI.  PHYSICAL EXAMINATION:  VITAL SIGNS:  Temperature 98.5, blood pressure 107/73, respiratory rate 18, pulse rate 80, oxygen saturation 96%  to 100% on 2 L.  Not using accessory muscles of breathing. GENERAL:  The patient is lying comfortably in bed, not in respiratory distress. HEART:  S1, S2 with no added sounds. LUNGS:  Normal vesicular breathing with equal air entry. ABDOMEN:  Soft, nontender.  Bowel sounds positive.  No rebound, no guarding. EXTREMITIES:  Without lower limb edema.  Peripheral pulses intact.  LABORATORY DATA:  Sodium 138, potassium 3.3, chloride 101, CO2 30, glucose 105, BUN 7, creatinine 0.6.  LFTs within normal.  CBC, white blood cells 4.6, hemoglobin 10.3, hematocrit 30.2, platelets 150.  ASSESSMENT AND PLAN: 1. Nausea, which is persistent.  As we mentioned, the patient has a     history of nausea in the past.  She is on chronic Phenergan dose.     It could be related to her Dilaudid.  Possibility of gastroparesis     which could be aggravated by the current pain medication.  We  will     discontinue PCA pump.  We will start Reglan IV q.6 h to be     scheduled rather than p.r.n.  Will provide the patient Risperdal 30     mg IV x1 and add morphine sulfate 0.5 mg IV q.4 h p.r.n. for pain.     Our goal is to switch the patient to oral pain medications.  We     will get KUB, amylase and lipase.  If the patient's symptoms     persist, may need to have a gastric-emptying study to evaluate for     gastroparesis. 2. Chest pain and shortness of breath.  She had a CT angiogram done on     May 5 which did not show any evidence of pulmonary embolism.  Did     show bibasilar atelectasis and scattered patchy ground glass     opacities.  We will get a chest x-ray two-view, EKG, and cycle     cardiac enzymes.  She already has a 2-D echo done on May 4, we will     not proceed with any 2-D echo.  We will wean off oxygen and place     the patient on nebulizer treatment _.  If there is any     evidence of pneumonia, we will start the patient on Avelox IV. 3. If the above does not resolve, consider Psychiatry consult.  We will follow up with you.  Thank you for the consultation.     [name] Bosie Helper, MD     HIE/MEDQ  D:  07/30/2010  T:  07/30/2010  Job:  [id number]  Electronically Signed by Ebony Cargo MD on 10/14/2010 02:29:40 PM

## 2010-12-05 ENCOUNTER — Encounter (HOSPITAL_BASED_OUTPATIENT_CLINIC_OR_DEPARTMENT_OTHER): Payer: Self-pay | Admitting: *Deleted

## 2010-12-05 ENCOUNTER — Emergency Department (HOSPITAL_BASED_OUTPATIENT_CLINIC_OR_DEPARTMENT_OTHER)
Admission: EM | Admit: 2010-12-05 | Discharge: 2010-12-05 | Disposition: A | Discharge status: Home / Self Care | Payer: PRIVATE HEALTH INSURANCE | Attending: Emergency Medicine | Admitting: Emergency Medicine

## 2010-12-05 ENCOUNTER — Emergency Department (INDEPENDENT_AMBULATORY_CARE_PROVIDER_SITE_OTHER): Payer: PRIVATE HEALTH INSURANCE

## 2010-12-05 DIAGNOSIS — R109 Unspecified abdominal pain: Secondary | ICD-10-CM

## 2010-12-05 DIAGNOSIS — R51 Headache: Secondary | ICD-10-CM | POA: Insufficient documentation

## 2010-12-05 DIAGNOSIS — R1110 Vomiting, unspecified: Secondary | ICD-10-CM

## 2010-12-05 DIAGNOSIS — N390 Urinary tract infection, site not specified: Secondary | ICD-10-CM | POA: Insufficient documentation

## 2010-12-05 DIAGNOSIS — F319 Bipolar disorder, unspecified: Secondary | ICD-10-CM | POA: Insufficient documentation

## 2010-12-05 LAB — URINALYSIS, ROUTINE W REFLEX MICROSCOPIC
Bilirubin Urine: NEGATIVE
Glucose, UA: NEGATIVE mg/dL
Hgb urine dipstick: NEGATIVE
Ketones, ur: 15 mg/dL — AB
Protein, ur: NEGATIVE mg/dL
pH: 7 (ref 5.0–8.0)

## 2010-12-05 LAB — CBC
HCT: 41 % (ref 36.0–46.0)
Hemoglobin: 14.3 g/dL (ref 12.0–15.0)
MCH: 27.9 pg (ref 26.0–34.0)
MCV: 79.9 fL (ref 78.0–100.0)
RBC: 5.13 MIL/uL — ABNORMAL HIGH (ref 3.87–5.11)

## 2010-12-05 LAB — COMPREHENSIVE METABOLIC PANEL
Alkaline Phosphatase: 91 U/L (ref 39–117)
BUN: 11 mg/dL (ref 6–23)
Creatinine, Ser: 0.6 mg/dL (ref 0.50–1.10)
GFR calc Af Amer: >60 mL/min (ref >60)
Glucose, Bld: 126 mg/dL — ABNORMAL HIGH (ref 70–99)
Potassium: 3.9 mEq/L (ref 3.5–5.1)
Total Bilirubin: 0.6 mg/dL (ref 0.3–1.2)
Total Protein: 7.9 g/dL (ref 6.0–8.3)

## 2010-12-05 LAB — DIFFERENTIAL
Eosinophils Absolute: 0 10*3/uL (ref 0.0–0.7)
Lymphs Abs: 0.8 10*3/uL (ref 0.7–4.0)
Monocytes Absolute: 0.2 10*3/uL (ref 0.1–1.0)
Monocytes Relative: 3 % (ref 3–12)
Neutrophils Relative %: 85 % — ABNORMAL HIGH (ref 43–77)

## 2010-12-05 LAB — URINE MICROSCOPIC-ADD ON

## 2010-12-05 LAB — LIPASE, BLOOD: Lipase: 22 U/L (ref 11–59)

## 2010-12-05 MED ORDER — ONDANSETRON 8 MG PO TBDP
8.0000 mg | ORAL_TABLET | Freq: Once | ORAL | Status: AC
  Administered 2010-12-05: 8 mg via ORAL
  Filled 2010-12-05: qty 1

## 2010-12-05 MED ORDER — OXYCODONE-ACETAMINOPHEN 5-325 MG PO TABS
2.0000 | ORAL_TABLET | Freq: Once | ORAL | Status: AC
  Administered 2010-12-05: 2 via ORAL
  Filled 2010-12-05: qty 2

## 2010-12-05 MED ORDER — MORPHINE SULFATE 4 MG/ML IJ SOLN
4.0000 mg | Freq: Once | INTRAMUSCULAR | Status: DC
  Administered 2010-12-05: 4 mg via INTRAVENOUS
  Filled 2010-12-05: qty 1

## 2010-12-05 MED ORDER — ONDANSETRON 8 MG PO TBDP: 8.0000 mg | ORAL_TABLET | Freq: Three times a day (TID) | ORAL | Status: AC | PRN

## 2010-12-05 MED ORDER — CEPHALEXIN 250 MG PO CAPS
500.0000 mg | ORAL_CAPSULE | Freq: Once | ORAL | Status: AC
  Administered 2010-12-05: 500 mg via ORAL
  Filled 2010-12-05: qty 2

## 2010-12-05 MED ORDER — SODIUM CHLORIDE 0.9 % IV BOLUS (SEPSIS)
1000.0000 mL | Freq: Once | INTRAVENOUS | Status: AC
  Administered 2010-12-05: 1000 mL via INTRAVENOUS

## 2010-12-05 MED ORDER — ONDANSETRON HCL 4 MG/2ML IJ SOLN
4.0000 mg | Freq: Once | INTRAMUSCULAR | Status: AC
  Administered 2010-12-05: 4 mg via INTRAVENOUS
  Filled 2010-12-05: qty 2

## 2010-12-05 MED ORDER — CEPHALEXIN 500 MG PO CAPS: 500.0000 mg | ORAL_CAPSULE | Freq: Four times a day (QID) | ORAL | Status: AC

## 2010-12-05 NOTE — ED Notes (Signed)
Reassess by EDP Hunt 

## 2010-12-05 NOTE — ED Notes (Signed)
D/c home with family= rx x 2 given- pt ambulatory without need for assist at time of d/c

## 2010-12-05 NOTE — ED Notes (Signed)
Reports no relief from pain meds- states "i've been vomiting"- pt has emesis bag she is spitting in but no significant amount of emesis noted

## 2010-12-05 NOTE — ED Notes (Signed)
Pt's iv pulled out while pt in BR- EDP Hunt notified- states does not need restart at this time

## 2010-12-05 NOTE — ED Notes (Signed)
MD at bedside. EDP Hunt at bedside

## 2010-12-05 NOTE — ED Notes (Signed)
Pt c/o N/V that began last night. Pt sts she had N/V last week from Tues to Thurs that resolved but has now returned.

## 2010-12-06 ENCOUNTER — Emergency Department (INDEPENDENT_AMBULATORY_CARE_PROVIDER_SITE_OTHER): Payer: PRIVATE HEALTH INSURANCE

## 2010-12-06 ENCOUNTER — Emergency Department (HOSPITAL_BASED_OUTPATIENT_CLINIC_OR_DEPARTMENT_OTHER)
Admission: EM | Admit: 2010-12-06 | Discharge: 2010-12-06 | Disposition: A | Discharge status: Home / Self Care | Payer: PRIVATE HEALTH INSURANCE | Attending: Emergency Medicine | Admitting: Emergency Medicine

## 2010-12-06 ENCOUNTER — Encounter (HOSPITAL_BASED_OUTPATIENT_CLINIC_OR_DEPARTMENT_OTHER): Payer: Self-pay | Admitting: *Deleted

## 2010-12-06 DIAGNOSIS — R109 Unspecified abdominal pain: Secondary | ICD-10-CM

## 2010-12-06 DIAGNOSIS — Z79899 Other long term (current) drug therapy: Secondary | ICD-10-CM | POA: Insufficient documentation

## 2010-12-06 DIAGNOSIS — F319 Bipolar disorder, unspecified: Secondary | ICD-10-CM | POA: Insufficient documentation

## 2010-12-06 DIAGNOSIS — N390 Urinary tract infection, site not specified: Secondary | ICD-10-CM | POA: Insufficient documentation

## 2010-12-06 DIAGNOSIS — M549 Dorsalgia, unspecified: Secondary | ICD-10-CM | POA: Insufficient documentation

## 2010-12-06 DIAGNOSIS — G8929 Other chronic pain: Secondary | ICD-10-CM | POA: Insufficient documentation

## 2010-12-06 DIAGNOSIS — R112 Nausea with vomiting, unspecified: Secondary | ICD-10-CM

## 2010-12-06 LAB — URINE CULTURE: Colony Count: 70000

## 2010-12-06 LAB — CBC
Hemoglobin: 14.3 g/dL (ref 12.0–15.0)
MCH: 28.1 pg (ref 26.0–34.0)
MCHC: 35 g/dL (ref 30.0–36.0)
Platelets: 194 10*3/uL (ref 150–400)

## 2010-12-06 LAB — DIFFERENTIAL
Basophils Absolute: 0 10*3/uL (ref 0.0–0.1)
Basophils Relative: 0 % (ref 0–1)
Eosinophils Absolute: 0 10*3/uL (ref 0.0–0.7)
Monocytes Relative: 5 % (ref 3–12)
Neutro Abs: 5.9 10*3/uL (ref 1.7–7.7)
Neutrophils Relative %: 78 % — ABNORMAL HIGH (ref 43–77)

## 2010-12-06 LAB — COMPREHENSIVE METABOLIC PANEL
ALT: 15 U/L (ref 0–35)
AST: 22 U/L (ref 0–37)
Albumin: 4.7 g/dL (ref 3.5–5.2)
Alkaline Phosphatase: 86 U/L (ref 39–117)
Chloride: 100 mEq/L (ref 96–112)
Potassium: 4.2 mEq/L (ref 3.5–5.1)
Sodium: 139 mEq/L (ref 135–145)
Total Bilirubin: 0.7 mg/dL (ref 0.3–1.2)
Total Protein: 7.9 g/dL (ref 6.0–8.3)

## 2010-12-06 LAB — LIPASE, BLOOD: Lipase: 32 U/L (ref 11–59)

## 2010-12-06 MED ORDER — PROMETHAZINE HCL 25 MG/ML IJ SOLN
INTRAMUSCULAR | Status: AC
  Administered 2010-12-06: 12.5 mg via INTRAVENOUS
  Filled 2010-12-06: qty 1

## 2010-12-06 MED ORDER — HYDROMORPHONE HCL 1 MG/ML IJ SOLN
1.0000 mg | Freq: Once | INTRAMUSCULAR | Status: AC
  Administered 2010-12-06: 1 mg via INTRAVENOUS
  Filled 2010-12-06: qty 1

## 2010-12-06 MED ORDER — PROMETHAZINE HCL 25 MG/ML IJ SOLN
12.5000 mg | Freq: Once | INTRAMUSCULAR | Status: AC
  Administered 2010-12-06: 12.5 mg via INTRAVENOUS
  Filled 2010-12-06: qty 1

## 2010-12-06 MED ORDER — PROMETHAZINE HCL 25 MG/ML IJ SOLN
12.5000 mg | Freq: Once | INTRAMUSCULAR | Status: AC
  Administered 2010-12-06: 12.5 mg via INTRAVENOUS

## 2010-12-06 MED ORDER — PROMETHAZINE HCL 25 MG PO TABS: 25.0000 mg | ORAL_TABLET | Freq: Four times a day (QID) | ORAL | Status: DC | PRN

## 2010-12-06 MED ORDER — SODIUM CHLORIDE 0.9 % IV BOLUS (SEPSIS): 1000.0000 mL | Freq: Once | INTRAVENOUS | Status: DC

## 2010-12-06 MED ORDER — DEXTROSE 5 % IV SOLN
1.0000 g | INTRAVENOUS | Status: DC
  Administered 2010-12-06: 1 g via INTRAVENOUS
  Filled 2010-12-06: qty 1

## 2010-12-06 MED ORDER — HYDROMORPHONE HCL 1 MG/ML IJ SOLN
1.0000 mg | Freq: Once | INTRAMUSCULAR | Status: AC
  Administered 2010-12-06: 1 mg via INTRAVENOUS

## 2010-12-06 MED ORDER — HYDROMORPHONE HCL 1 MG/ML IJ SOLN
INTRAMUSCULAR | Status: AC
  Administered 2010-12-06: 1 mg via INTRAVENOUS
  Filled 2010-12-06: qty 1

## 2010-12-06 NOTE — ED Notes (Signed)
Pt seen here yesterday for same Dx UTI, pt c/o increased n/v and pain

## 2010-12-06 NOTE — ED Provider Notes (Signed)
History     CSN: [id number] Arrival date & time: 12/06/2010  1:36 PM  Chief Complaint  Patient presents with  . Abdominal Pain    HPI  (Consider location/radiation/quality/duration/timing/severity/associated sxs/prior treatment)  Patient is a 45 y.o. female presenting with abdominal pain. The history is provided by the patient. No language interpreter was used.  Abdominal Pain The primary symptoms of the illness include abdominal pain. The current episode started 2 days ago. The onset of the illness was gradual. The problem has been gradually worsening.  The illness is associated with a recent illness. The patient states that she believes she is currently not pregnant. The patient has not had a change in bowel habit. Additional symptoms associated with the illness include frequency and back pain. Significant associated medical issues do not include PUD.  Pt was seen here yesterday for a urinary tract infection.  Pt reports severe pain in right back today and vomitting.  Pt reports unable to take keflex without vomitting.    Past Medical History  Diagnosis Date  . Bipolar 1 disorder   . Headache   . Bipolar 1 disorder     Past Surgical History  Procedure Date  . Vaginal hysterectomy   . Cholecystectomy   . Abdominal hysterectomy   . Foot surgery     History reviewed. No pertinent family history.  History  Substance Use Topics  . Smoking status: Never Smoker   . Smokeless tobacco: Not on file  . Alcohol Use: No    OB History    Grav Para Term Preterm Abortions TAB SAB Ect Mult Living                  Review of Systems  Review of Systems  Gastrointestinal: Positive for abdominal pain.  Genitourinary: Positive for frequency.  Musculoskeletal: Positive for back pain.  All other systems reviewed and are negative.    Allergies  Review of patient's allergies indicates no known allergies.  Home Medications   Current Outpatient Rx  Name Route Sig Dispense Refill    . CEPHALEXIN 500 MG PO CAPS Oral Take 1 capsule (500 mg total) by mouth 4 (four) times daily. 40 capsule 0  . FENTANYL 50 MCG/HR TD PT72 Transdermal Place 1 patch onto the skin every 3 (three) days.      Marland Kitchen GABAPENTIN 300 MG PO CAPS Oral Take 300 mg by mouth daily.      Marland Kitchen HYDROCODONE-ACETAMINOPHEN 5-500 MG PO TABS Oral Take 1 tablet by mouth as needed. For foot pain     . ONDANSETRON 8 MG PO TBDP Oral Take 1 tablet (8 mg total) by mouth every 8 (eight) hours as needed for nausea. 20 tablet 0  . QUETIAPINE FUMARATE 400 MG PO TABS Oral Take 800 mg by mouth at bedtime.      Marland Kitchen ZOLPIDEM TARTRATE 10 MG PO TABS Oral Take 10 mg by mouth at bedtime as needed. For sleep       Physical Exam    BP 141/51  Pulse 81  Temp(Src) 97.4 F (36.3 C) (Oral)  Resp 26  Wt 190 lb (86.183 kg)  SpO2 100%  Physical Exam  Nursing note and vitals reviewed. Constitutional: She is oriented to person, place, and time. She appears well-developed and well-nourished.  HENT:  Head: Normocephalic and atraumatic.  Eyes: Conjunctivae are normal. Pupils are equal, round, and reactive to light.  Neck: Normal range of motion. Neck supple.  Cardiovascular: Normal rate and regular rhythm.   Pulmonary/Chest:  Effort normal and breath sounds normal.  Abdominal: Soft. There is tenderness.  Musculoskeletal: Normal range of motion.  Neurological: She is alert and oriented to person, place, and time. She has normal reflexes.  Skin: Skin is warm.  Psychiatric: She has a normal mood and affect.    ED Course  Procedures (including critical care time)   Labs Reviewed  CBC  DIFFERENTIAL  COMPREHENSIVE METABOLIC PANEL  LIPASE, BLOOD   Dg Abd Acute W/chest  12/05/2010  *RADIOLOGY REPORT*  Clinical Data: 45 year old female with abdominal pain and vomiting.  ACUTE ABDOMEN SERIES (ABDOMEN 2 VIEW & CHEST 1 VIEW)  Comparison: 07/30/2010  Findings: The cardiomediastinal silhouette is unremarkable. The lungs are clear. There is no  evidence of airspace disease, pleural effusion or pneumothorax.  The bowel gas pattern is normal. There is no evidence of bowel obstruction or pneumoperitoneum. Cholecystectomy clips are present. No suspicious calcifications are identified.  IMPRESSION: No acute abnormalities.  Original Report Authenticated By: Rosendo Gros, M.D.     No diagnosis found.   MDM I reviewed x-rays and labs from yesterday patient was diagnosed with UTI and placed on Keflex. 2 the severity of the pain in the right flank area I obtained a CT scan to make sure the patient did not have a kidney stone/obstruction. Patient is noted to have problems with chronic pain. Patient reports this is not her typical pain. Patient had relief of pain with Dilaudid and Zofran. She initially was sweaty and appeared very uncomfortable. Patient is no longer sweaty she appears much more at ease I advised the patient that we will give her Rocephin 1 g to treat the urinary tract infection her CT scan is normal. I have advised her to continue cephalexin at home I will write her a prescription for Phenergan she is to see her physician for recheck on Monday.        Langston Masker, [place] 12/06/10 1901

## 2010-12-16 NOTE — ED Provider Notes (Signed)
History     CSN: [id number] Arrival date & time: 12/06/2010  1:36 PM  Chief Complaint  Patient presents with  . Abdominal Pain    HPI  (Consider location/radiation/quality/duration/timing/severity/associated sxs/prior treatment)  Patient is a 45 y.o. female presenting with abdominal pain. The history is provided by the patient. No language interpreter was used.  Abdominal Pain The primary symptoms of the illness include abdominal pain. The current episode started 2 days ago. The onset of the illness was gradual. The problem has been gradually worsening.  The illness is associated with a recent illness. The patient states that she believes she is currently not pregnant. The patient has not had a change in bowel habit. Additional symptoms associated with the illness include frequency and back pain. Significant associated medical issues do not include PUD.  Pt was seen here yesterday for a urinary tract infection.  Pt reports severe pain in right back today and vomitting.  Pt reports unable to take keflex without vomitting.    Past Medical History  Diagnosis Date  . Bipolar 1 disorder   . Headache   . Bipolar 1 disorder     Past Surgical History  Procedure Date  . Vaginal hysterectomy   . Cholecystectomy   . Abdominal hysterectomy   . Foot surgery     History reviewed. No pertinent family history.  History  Substance Use Topics  . Smoking status: Never Smoker   . Smokeless tobacco: Not on file  . Alcohol Use: No    OB History    Grav Para Term Preterm Abortions TAB SAB Ect Mult Living                  Review of Systems  Review of Systems  Gastrointestinal: Positive for abdominal pain.  Genitourinary: Positive for frequency.  Musculoskeletal: Positive for back pain.  All other systems reviewed and are negative.    Allergies  Review of patient's allergies indicates no known allergies.  Home Medications   Current Outpatient Rx  Name Route Sig Dispense Refill    . CEPHALEXIN 500 MG PO CAPS Oral Take 1 capsule (500 mg total) by mouth 4 (four) times daily. 40 capsule 0  . FENTANYL 50 MCG/HR TD PT72 Transdermal Place 1 patch onto the skin every 3 (three) days.      Marland Kitchen GABAPENTIN 300 MG PO CAPS Oral Take 300 mg by mouth daily.      Marland Kitchen HYDROCODONE-ACETAMINOPHEN 5-500 MG PO TABS Oral Take 1 tablet by mouth as needed. For foot pain     . ONDANSETRON 8 MG PO TBDP Oral Take 1 tablet (8 mg total) by mouth every 8 (eight) hours as needed for nausea. 20 tablet 0  . QUETIAPINE FUMARATE 400 MG PO TABS Oral Take 800 mg by mouth at bedtime.      Marland Kitchen ZOLPIDEM TARTRATE 10 MG PO TABS Oral Take 10 mg by mouth at bedtime as needed. For sleep       Physical Exam    BP 141/51  Pulse 81  Temp(Src) 97.4 F (36.3 C) (Oral)  Resp 26  Wt 190 lb (86.183 kg)  SpO2 100%  Physical Exam  Nursing note and vitals reviewed. Constitutional: She is oriented to person, place, and time. She appears well-developed and well-nourished.  HENT:  Head: Normocephalic and atraumatic.  Eyes: Conjunctivae are normal. Pupils are equal, round, and reactive to light.  Neck: Normal range of motion. Neck supple.  Cardiovascular: Normal rate and regular rhythm.   Pulmonary/Chest:  Effort normal and breath sounds normal.  Abdominal: Soft. There is tenderness.  Musculoskeletal: Normal range of motion.  Neurological: She is alert and oriented to person, place, and time. She has normal reflexes.  Skin: Skin is warm.  Psychiatric: She has a normal mood and affect.    ED Course  Procedures (including critical care time)   Labs Reviewed  CBC  DIFFERENTIAL  COMPREHENSIVE METABOLIC PANEL  LIPASE, BLOOD   Dg Abd Acute W/chest  12/05/2010  *RADIOLOGY REPORT*  Clinical Data: 45 year old female with abdominal pain and vomiting.  ACUTE ABDOMEN SERIES (ABDOMEN 2 VIEW & CHEST 1 VIEW)  Comparison: 07/30/2010  Findings: The cardiomediastinal silhouette is unremarkable. The lungs are clear. There is no  evidence of airspace disease, pleural effusion or pneumothorax.  The bowel gas pattern is normal. There is no evidence of bowel obstruction or pneumoperitoneum. Cholecystectomy clips are present. No suspicious calcifications are identified.  IMPRESSION: No acute abnormalities.  Original Report Authenticated By: Rosendo Gros, M.D.     No diagnosis found.   MDM I reviewed x-rays and labs from yesterday patient was diagnosed with UTI and placed on Keflex. 2 the severity of the pain in the right flank area I obtained a CT scan to make sure the patient did not have a kidney stone/obstruction. Patient is noted to have problems with chronic pain. Patient reports this is not her typical pain. Patient had relief of pain with Dilaudid and Zofran. She initially was sweaty and appeared very uncomfortable. Patient is no longer sweaty she appears much more at ease I advised the patient that we will give her Rocephin 1 g to treat the urinary tract infection her CT scan is normal. I have advised her to continue cephalexin at home I will write her a prescription for Phenergan she is to see her physician for recheck on Monday.        Langston Masker, [place] 12/06/10 1901  Medical screening examination/treatment/procedure(s) were performed by non-physician practitioner and as supervising physician I was immediately available for consultation/collaboration.   Nelia Shi, MD 12/16/10 1136

## 2010-12-26 NOTE — ED Provider Notes (Signed)
History     CSN: [id number] Arrival date & time: 12/05/2010  5:46 PM  Chief Complaint  Patient presents with  . Emesis    (Consider location/radiation/quality/duration/timing/severity/associated sxs/prior treatment) Patient is a 45 y.o. female presenting with vomiting. The history is provided by the patient. No language interpreter was used.  Emesis  This is a new problem. The current episode started yesterday. The problem occurs 5 to 10 times per day. The problem has not changed since onset.The emesis has an appearance of stomach contents. There has been no fever. Associated symptoms include abdominal pain. Pertinent negatives include no chills.    Past Medical History  Diagnosis Date  . Bipolar 1 disorder   . Headache   . Bipolar 1 disorder     Past Surgical History  Procedure Date  . Vaginal hysterectomy   . Cholecystectomy   . Abdominal hysterectomy   . Foot surgery     No family history on file.  History  Substance Use Topics  . Smoking status: Never Smoker   . Smokeless tobacco: Not on file  . Alcohol Use: No    OB History    Grav Para Term Preterm Abortions TAB SAB Ect Mult Living                  Review of Systems  Constitutional: Negative.  Negative for chills.  HENT: Negative.   Eyes: Negative.   Respiratory: Negative.   Cardiovascular: Negative.   Gastrointestinal: Positive for vomiting and abdominal pain.  Genitourinary: Negative.   Musculoskeletal: Negative.   Skin: Negative.   Neurological: Negative.   Hematological: Negative.   Psychiatric/Behavioral: Negative.   All other systems reviewed and are negative.    Allergies  Review of patient's allergies indicates no known allergies.  Home Medications   Current Outpatient Rx  Name Route Sig Dispense Refill  . FENTANYL 50 MCG/HR TD PT72 Transdermal Place 1 patch onto the skin every 3 (three) days.      Marland Kitchen GABAPENTIN 300 MG PO CAPS Oral Take 300 mg by mouth daily.      . QUETIAPINE  FUMARATE 400 MG PO TABS Oral Take 800 mg by mouth at bedtime.      Marland Kitchen ZOLPIDEM TARTRATE 10 MG PO TABS Oral Take 10 mg by mouth at bedtime as needed. For sleep     . HYDROCODONE-ACETAMINOPHEN 5-500 MG PO TABS Oral Take 1 tablet by mouth as needed. For foot pain       BP 121/82  Pulse 73  Temp(Src) 98.7 F (37.1 C) (Oral)  Resp 22  SpO2 97%  Physical Exam  Nursing note and vitals reviewed. Constitutional: She is oriented to person, place, and time. She appears well-developed and well-nourished. No distress.  HENT:  Head: Normocephalic and atraumatic.  Eyes: Conjunctivae and EOM are normal. Pupils are equal, round, and reactive to light.  Neck: Normal range of motion.  Cardiovascular: Normal rate, regular rhythm, normal heart sounds and intact distal pulses.  Exam reveals no gallop and no friction rub.   No murmur heard. Pulmonary/Chest: Effort normal and breath sounds normal. No respiratory distress. She has no wheezes. She has no rales.  Abdominal: Soft. Bowel sounds are normal. She exhibits no distension. There is tenderness. There is no rebound and no guarding.       Diffuse  Musculoskeletal: Normal range of motion.  Neurological: She is alert and oriented to person, place, and time. She has normal reflexes. No cranial nerve deficit. She exhibits normal muscle  tone. Coordination normal.  Skin: Skin is warm and dry. No erythema.  Psychiatric: She has a normal mood and affect.    ED Course  Procedures (including critical care time)  Labs Reviewed  URINALYSIS, ROUTINE W REFLEX MICROSCOPIC - Abnormal; Notable for the following:    Appearance CLOUDY (*)    Ketones, ur 15 (*)    Leukocytes, UA MODERATE (*)    All other components within normal limits  CBC - Abnormal; Notable for the following:    RBC 5.13 (*)    All other components within normal limits  DIFFERENTIAL - Abnormal; Notable for the following:    Neutrophils Relative 85 (*)    All other components within normal  limits  COMPREHENSIVE METABOLIC PANEL - Abnormal; Notable for the following:    Glucose, Bld 126 (*)    All other components within normal limits  URINE MICROSCOPIC-ADD ON - Abnormal; Notable for the following:    Squamous Epithelial / LPF FEW (*)    Bacteria, UA FEW (*)    All other components within normal limits  PREGNANCY, URINE  LIPASE, BLOOD  URINE CULTURE   No results found.   1. UTI (urinary tract infection)   2. Abdominal pain       MDM  Patient was admitted and evaluated by myself. She had benign abdominal exam but did have tenderness to palpation. Patient was evaluated for her symptoms. She did have evidence of urinary tract infection. She was treated for this with Keflex. She was discharged home with this antibiotic. Patient was treated with oral pain medication here in the emergency department for her pain. She is a member pain clinic and therefore no narcotic medications were given her discharge. She was discharged home with prescription for Zofran as well. Patient was improved prior to discharge was able to tolerate oral intake. She was discharged home in good condition. Patient and family were comfortable with plan.        Cyndra Numbers, MD 12/26/10 [phone]

## 2011-02-23 ENCOUNTER — Emergency Department (INDEPENDENT_AMBULATORY_CARE_PROVIDER_SITE_OTHER): Payer: Medicare Other

## 2011-02-23 ENCOUNTER — Emergency Department (HOSPITAL_BASED_OUTPATIENT_CLINIC_OR_DEPARTMENT_OTHER)
Admission: EM | Admit: 2011-02-23 | Discharge: 2011-02-23 | Disposition: A | Discharge status: Home / Self Care | Payer: Medicare Other | Attending: Emergency Medicine | Admitting: Emergency Medicine

## 2011-02-23 ENCOUNTER — Encounter (HOSPITAL_BASED_OUTPATIENT_CLINIC_OR_DEPARTMENT_OTHER): Payer: Self-pay | Admitting: Emergency Medicine

## 2011-02-23 DIAGNOSIS — R109 Unspecified abdominal pain: Secondary | ICD-10-CM | POA: Insufficient documentation

## 2011-02-23 DIAGNOSIS — R1110 Vomiting, unspecified: Secondary | ICD-10-CM

## 2011-02-23 DIAGNOSIS — F319 Bipolar disorder, unspecified: Secondary | ICD-10-CM | POA: Insufficient documentation

## 2011-02-23 DIAGNOSIS — R112 Nausea with vomiting, unspecified: Secondary | ICD-10-CM | POA: Insufficient documentation

## 2011-02-23 HISTORY — DX: Disorder of kidney and ureter, unspecified: N28.9

## 2011-02-23 HISTORY — DX: Calculus of kidney: N20.0

## 2011-02-23 LAB — URINALYSIS, ROUTINE W REFLEX MICROSCOPIC
Glucose, UA: NEGATIVE mg/dL
Hgb urine dipstick: NEGATIVE
Ketones, ur: 40 mg/dL — AB
Protein, ur: 30 mg/dL — AB
pH: 8.5 — ABNORMAL HIGH (ref 5.0–8.0)

## 2011-02-23 LAB — BASIC METABOLIC PANEL
CO2: 26 mEq/L (ref 19–32)
Calcium: 9.6 mg/dL (ref 8.4–10.5)
Chloride: 101 mEq/L (ref 96–112)
Creatinine, Ser: 0.64 mg/dL (ref 0.50–1.10)
Glucose, Bld: 117 mg/dL — ABNORMAL HIGH (ref 70–99)

## 2011-02-23 LAB — URINE MICROSCOPIC-ADD ON

## 2011-02-23 MED ORDER — OXYCODONE-ACETAMINOPHEN 5-325 MG PO TABS
2.0000 | ORAL_TABLET | Freq: Once | ORAL | Status: DC
  Filled 2011-02-23: qty 2

## 2011-02-23 MED ORDER — HYDROMORPHONE HCL PF 1 MG/ML IJ SOLN
1.0000 mg | Freq: Once | INTRAMUSCULAR | Status: DC
  Filled 2011-02-23: qty 1

## 2011-02-23 MED ORDER — HYDROMORPHONE HCL PF 1 MG/ML IJ SOLN
1.0000 mg | Freq: Once | INTRAMUSCULAR | Status: AC
  Administered 2011-02-23: 1 mg via INTRAVENOUS

## 2011-02-23 MED ORDER — HYDROMORPHONE HCL PF 1 MG/ML IJ SOLN
1.0000 mg | Freq: Once | INTRAMUSCULAR | Status: AC
  Administered 2011-02-23: 1 mg via INTRAVENOUS
  Filled 2011-02-23: qty 1

## 2011-02-23 MED ORDER — LORAZEPAM 2 MG/ML IJ SOLN
1.0000 mg | Freq: Once | INTRAMUSCULAR | Status: AC
  Administered 2011-02-23: 1 mg via INTRAVENOUS
  Filled 2011-02-23: qty 1

## 2011-02-23 MED ORDER — ONDANSETRON HCL 4 MG/2ML IJ SOLN
4.0000 mg | Freq: Once | INTRAMUSCULAR | Status: AC
  Administered 2011-02-23: 4 mg via INTRAVENOUS
  Filled 2011-02-23: qty 2

## 2011-02-23 MED ORDER — HYDROMORPHONE HCL PF 1 MG/ML IJ SOLN
INTRAMUSCULAR | Status: AC
  Administered 2011-02-23: 1 mg
  Filled 2011-02-23: qty 1

## 2011-02-23 MED ORDER — HYDROMORPHONE HCL PF 1 MG/ML IJ SOLN: 1.0000 mg | Freq: Once | INTRAMUSCULAR | Status: DC

## 2011-02-23 MED ORDER — DIPHENHYDRAMINE HCL 50 MG/ML IJ SOLN: 25.0000 mg | Freq: Once | INTRAMUSCULAR | Status: DC

## 2011-02-23 MED ORDER — PROMETHAZINE HCL 25 MG RE SUPP: 25.0000 mg | Freq: Four times a day (QID) | RECTAL | Status: DC | PRN

## 2011-02-23 MED ORDER — PROMETHAZINE HCL 25 MG/ML IJ SOLN
25.0000 mg | Freq: Once | INTRAMUSCULAR | Status: AC
  Administered 2011-02-23: 25 mg via INTRAVENOUS
  Filled 2011-02-23: qty 1

## 2011-02-23 NOTE — ED Notes (Signed)
Care plan safe use of meds and follow up reviewed

## 2011-02-23 NOTE — ED Notes (Signed)
Severe flank pain with vomiting x 2 days

## 2011-02-23 NOTE — Discharge Instructions (Signed)

## 2011-02-23 NOTE — ED Provider Notes (Signed)
History     CSN: [id number] Arrival date & time: 02/23/2011  8:42 AM   First MD Initiated Contact with Patient 02/23/11 [phone]      Chief Complaint  Patient presents with  . Flank Pain    flank pain with with Hx of kidney stones    (Consider location/radiation/quality/duration/timing/severity/associated sxs/prior treatment) HPI Comments: Patient presents with flank pain that she states began yesterday.  She's had associated nausea and vomiting but no fevers.  At this point in time she cannot delineate well which side is worse.  She states that feels similar to her past kidney stones.  Bowel movements are normal.  No dysuria or hematuria.  No prior abdominal surgeries.  Patient is a 45 y.o. female presenting with flank pain. The history is provided by the patient. No language interpreter was used.  Flank Pain This is a recurrent problem. The current episode started yesterday. The problem has been gradually worsening. Associated symptoms include abdominal pain. Pertinent negatives include no chest pain, no headaches and no shortness of breath. The symptoms are aggravated by nothing. The symptoms are relieved by nothing.    Past Medical History  Diagnosis Date  . Bipolar 1 disorder   . Headache   . Bipolar 1 disorder   . Renal disorder   . Kidney stones     Past Surgical History  Procedure Date  . Vaginal hysterectomy   . Cholecystectomy   . Abdominal hysterectomy   . Foot surgery     No family history on file.  History  Substance Use Topics  . Smoking status: Never Smoker   . Smokeless tobacco: Not on file  . Alcohol Use: No    OB History    Grav Para Term Preterm Abortions TAB SAB Ect Mult Living                  Review of Systems  Constitutional: Negative.  Negative for fever and chills.  HENT: Negative.   Eyes: Negative.  Negative for discharge and redness.  Respiratory: Negative.  Negative for cough and shortness of breath.   Cardiovascular: Negative.   Negative for chest pain.  Gastrointestinal: Positive for nausea, vomiting and abdominal pain. Negative for diarrhea.  Genitourinary: Positive for flank pain. Negative for dysuria and vaginal discharge.  Musculoskeletal: Negative.  Negative for back pain.  Skin: Negative.  Negative for color change and rash.  Neurological: Negative.  Negative for syncope and headaches.  Hematological: Negative.  Negative for adenopathy.  Psychiatric/Behavioral: Negative.  Negative for confusion.  All other systems reviewed and are negative.    Allergies  Review of patient's allergies indicates no known allergies.  Home Medications   Current Outpatient Rx  Name Route Sig Dispense Refill  . FENTANYL 50 MCG/HR TD PT72 Transdermal Place 1 patch onto the skin every 3 (three) days.      Marland Kitchen GABAPENTIN 300 MG PO CAPS Oral Take 300 mg by mouth daily.      Marland Kitchen HYDROCODONE-ACETAMINOPHEN 5-500 MG PO TABS Oral Take 1 tablet by mouth as needed. For foot pain     . QUETIAPINE FUMARATE 400 MG PO TABS Oral Take 800 mg by mouth at bedtime.      Marland Kitchen ZOLPIDEM TARTRATE 10 MG PO TABS Oral Take 10 mg by mouth at bedtime as needed. For sleep       BP 127/101  Pulse 85  Temp(Src) 97.7 F (36.5 C) (Oral)  Resp 24  SpO2 100%  Physical Exam  Constitutional: She  is oriented to person, place, and time. She appears well-developed and well-nourished.  Non-toxic appearance. She does not have a sickly appearance.       Patient appears uncomfortable  HENT:  Head: Normocephalic and atraumatic.  Eyes: Conjunctivae, EOM and lids are normal. Pupils are equal, round, and reactive to light. No scleral icterus.  Neck: Trachea normal and normal range of motion. Neck supple.  Cardiovascular: Normal rate, regular rhythm and normal heart sounds.   Pulmonary/Chest: Effort normal and breath sounds normal. No respiratory distress. She has no wheezes. She has no rales.  Abdominal: Soft. Normal appearance. There is no tenderness. There is no  rebound, no guarding and no CVA tenderness.  Musculoskeletal: Normal range of motion.  Neurological: She is alert and oriented to person, place, and time. She has normal strength.  Skin: Skin is warm, dry and intact. No rash noted.  Psychiatric: She has a normal mood and affect. Her behavior is normal. Judgment and thought content normal.    ED Course  Procedures (including critical care time)  Results for orders placed during the hospital encounter of 02/23/11  BASIC METABOLIC PANEL      Component Value Range   Sodium 140  135 - 145 (mEq/L)   Potassium 4.0  3.5 - 5.1 (mEq/L)   Chloride 101  96 - 112 (mEq/L)   CO2 26  19 - 32 (mEq/L)   Glucose, Bld 117 (*) 70 - 99 (mg/dL)   BUN 12  6 - 23 (mg/dL)   Creatinine, Ser 4.09  0.50 - 1.10 (mg/dL)   Calcium 9.6  8.4 - 81.1 (mg/dL)   GFR calc non Af Amer >90  >90 (mL/min)   GFR calc Af Amer >90  >90 (mL/min)   Ct Abdomen Pelvis Wo Contrast  02/23/2011  *RADIOLOGY REPORT*  Clinical Data: Flank pain, vomiting, history of renal stones. Status post cholecystectomy and partial hysterectomy.  CT ABDOMEN AND PELVIS WITHOUT CONTRAST  Technique:  Multidetector CT imaging of the abdomen and pelvis was performed following the standard protocol without intravenous contrast.  Comparison: 12/06/2010  Findings: Lung bases are clear.  Liver is notable for focal fat along the falciform ligament.  Unenhanced spleen, pancreas, and adrenal glands within normal limits.  Status post cholecystectomy.  No intrahepatic or extrahepatic ductal dilatation.  Kidneys within normal limits.  No renal calculi or hydronephrosis.  No evidence of bowel obstruction.  Normal appendix.  No colonic wall thickening or inflammatory changes.  No evidence of abdominal aortic aneurysm.  No abdominopelvic ascites.  No suspicious abdominopelvic lymphadenopathy.  Status post hysterectomy.  No adnexal masses.  No ureteral or bladder calculi.  Mild degenerative changes of the visualized  thoracolumbar spine.  IMPRESSION: No renal, ureteral, or bladder calculi.  No hydronephrosis.  No evidence of bowel obstruction. Normal appendix.  No CT findings to account for the patient's abdominal pain.  Original Report Authenticated By: Charline Bills, M.D.      MDM  Patient with improvement in her pain here with multiple doses of pain medication.  Her nausea is also improved.  At this point in time I have no acute etiology for her pain as she does not appear to have a UTI or a kidney stone.  She has no other acute pathology on her CAT scan.  Patient has a soft benign abdomen on exam as well.  She also has normal vital signs and is afebrile here.  As the patient is feeling better I feel that she go home with  precautions at this point in time.  Per medical record reviewed patient has presented with similar symptoms like this before without defined etiology for them.        Nat Christen, MD 02/23/11 [phone]

## 2011-02-27 ENCOUNTER — Ambulatory Visit (INDEPENDENT_AMBULATORY_CARE_PROVIDER_SITE_OTHER): Payer: Medicare Other

## 2011-02-27 DIAGNOSIS — M79609 Pain in unspecified limb: Secondary | ICD-10-CM

## 2011-02-27 DIAGNOSIS — G894 Chronic pain syndrome: Secondary | ICD-10-CM

## 2011-03-04 ENCOUNTER — Emergency Department (HOSPITAL_BASED_OUTPATIENT_CLINIC_OR_DEPARTMENT_OTHER)
Admission: EM | Admit: 2011-03-04 | Discharge: 2011-03-04 | Disposition: A | Discharge status: Home / Self Care | Payer: Medicare Other | Source: Home / Self Care | Attending: Emergency Medicine | Admitting: Emergency Medicine

## 2011-03-04 ENCOUNTER — Encounter (HOSPITAL_BASED_OUTPATIENT_CLINIC_OR_DEPARTMENT_OTHER): Payer: Self-pay

## 2011-03-04 DIAGNOSIS — R1110 Vomiting, unspecified: Secondary | ICD-10-CM

## 2011-03-04 DIAGNOSIS — F319 Bipolar disorder, unspecified: Secondary | ICD-10-CM | POA: Insufficient documentation

## 2011-03-04 DIAGNOSIS — Z79899 Other long term (current) drug therapy: Secondary | ICD-10-CM | POA: Insufficient documentation

## 2011-03-04 DIAGNOSIS — F19939 Other psychoactive substance use, unspecified with withdrawal, unspecified: Secondary | ICD-10-CM | POA: Insufficient documentation

## 2011-03-04 DIAGNOSIS — F1120 Opioid dependence, uncomplicated: Secondary | ICD-10-CM | POA: Insufficient documentation

## 2011-03-04 DIAGNOSIS — F1123 Opioid dependence with withdrawal: Secondary | ICD-10-CM

## 2011-03-04 LAB — BASIC METABOLIC PANEL
Chloride: 99 mEq/L (ref 96–112)
GFR calc Af Amer: >90 mL/min (ref >90)
GFR calc non Af Amer: 88 mL/min — ABNORMAL LOW (ref >90)
Potassium: 3.9 mEq/L (ref 3.5–5.1)
Sodium: 137 mEq/L (ref 135–145)

## 2011-03-04 LAB — CBC
MCH: 28.2 pg (ref 26.0–34.0)
MCHC: 35.5 g/dL (ref 30.0–36.0)
Platelets: 202 10*3/uL (ref 150–400)
RBC: 5.39 MIL/uL — ABNORMAL HIGH (ref 3.87–5.11)

## 2011-03-04 LAB — DIFFERENTIAL
Basophils Relative: 0 % (ref 0–1)
Eosinophils Absolute: 0 10*3/uL (ref 0.0–0.7)
Neutro Abs: 4.4 10*3/uL (ref 1.7–7.7)
Neutrophils Relative %: 71 % (ref 43–77)

## 2011-03-04 MED ORDER — OXYCODONE-ACETAMINOPHEN 5-325 MG PO TABS: 2.0000 | ORAL_TABLET | ORAL | Status: DC | PRN

## 2011-03-04 MED ORDER — SODIUM CHLORIDE 0.9 % IV SOLN
Freq: Once | INTRAVENOUS | Status: AC
  Administered 2011-03-04: 21:00:00 via INTRAVENOUS

## 2011-03-04 MED ORDER — ONDANSETRON HCL 4 MG/2ML IJ SOLN
4.0000 mg | Freq: Once | INTRAMUSCULAR | Status: AC
  Administered 2011-03-04: 4 mg via INTRAVENOUS
  Filled 2011-03-04: qty 2

## 2011-03-04 MED ORDER — HYDROMORPHONE HCL PF 1 MG/ML IJ SOLN
1.0000 mg | Freq: Once | INTRAMUSCULAR | Status: AC
  Administered 2011-03-04: 1 mg via INTRAVENOUS
  Filled 2011-03-04: qty 1

## 2011-03-04 MED ORDER — PROMETHAZINE HCL 25 MG PO TABS: 25.0000 mg | ORAL_TABLET | Freq: Four times a day (QID) | ORAL | Status: DC | PRN

## 2011-03-04 NOTE — ED Notes (Signed)
Vomiting since earlier today

## 2011-03-04 NOTE — ED Provider Notes (Signed)
History     CSN: [id number] Arrival date & time: 03/04/2011  8:11 PM   First MD Initiated Contact with Patient 03/04/11 2021      Chief Complaint  Patient presents with  . Emesis    (Consider location/radiation/quality/duration/timing/severity/associated sxs/prior treatment) HPI Comments: Patient on fentanyl patch for chronic pain from a broken foot and ankle which she had several procedures on.  Ran out of fentanyl yesterday, now very nauseated and uncomfortable.  Took 4 ativan tablets today to try to make her feel better.  Patient is a 45 y.o. female presenting with vomiting. The history is provided by the patient.  Emesis  This is a new problem. The current episode started yesterday. The problem has not changed since onset.The emesis has an appearance of stomach contents. There has been no fever. Pertinent negatives include no chills, no diarrhea and no fever.    Past Medical History  Diagnosis Date  . Bipolar 1 disorder   . Headache   . Bipolar 1 disorder   . Renal disorder   . Kidney stones     Past Surgical History  Procedure Date  . Vaginal hysterectomy   . Cholecystectomy   . Abdominal hysterectomy   . Foot surgery     No family history on file.  History  Substance Use Topics  . Smoking status: Never Smoker   . Smokeless tobacco: Not on file  . Alcohol Use: No    OB History    Grav Para Term Preterm Abortions TAB SAB Ect Mult Living                  Review of Systems  Constitutional: Negative for fever and chills.  Gastrointestinal: Positive for vomiting. Negative for diarrhea.  All other systems reviewed and are negative.    Allergies  Review of patient's allergies indicates no known allergies.  Home Medications   Current Outpatient Rx  Name Route Sig Dispense Refill  . FENTANYL 50 MCG/HR TD PT72 Transdermal Place 1 patch onto the skin every 3 (three) days.      Marland Kitchen GABAPENTIN 300 MG PO CAPS Oral Take 300 mg by mouth daily.      Marland Kitchen LORAZEPAM  2 MG PO TABS Oral Take 8 mg by mouth once.      . QUETIAPINE FUMARATE 400 MG PO TABS Oral Take 800 mg by mouth at bedtime.      Marland Kitchen ZOLPIDEM TARTRATE 10 MG PO TABS Oral Take 10 mg by mouth at bedtime as needed. For sleep       BP 114/84  Pulse 118  Temp(Src) 97.7 F (36.5 C) (Oral)  Resp 18  Ht 5\' 6"  (1.676 m)  Wt 185 lb (83.915 kg)  BMI 29.86 kg/m2  SpO2 95%  Physical Exam  Nursing note and vitals reviewed. Constitutional: She is oriented to person, place, and time. She appears well-developed and well-nourished.       Anxious.  HENT:  Head: Normocephalic and atraumatic.  Mouth/Throat: Oropharynx is clear and moist.  Eyes: EOM are normal. Pupils are equal, round, and reactive to light.  Neck: Normal range of motion. Neck supple.  Cardiovascular: Normal rate and regular rhythm.   No murmur heard. Pulmonary/Chest: Effort normal and breath sounds normal. No respiratory distress.  Abdominal: Soft. Bowel sounds are normal. She exhibits no distension. There is no tenderness.  Musculoskeletal: Normal range of motion.  Neurological: She is alert and oriented to person, place, and time.  Skin: Skin is warm and dry.  She is not diaphoretic.    ED Course  Procedures (including critical care time)   Labs Reviewed  CBC  DIFFERENTIAL  BASIC METABOLIC PANEL   No results found.   No diagnosis found.    MDM   Sounds like withdrawal from fentanyl patch.  Will give pain and nausea meds, check labs, and hydrate.  Labs look okay.  Patient feeling better.  Will discharge with instructions to follow up with pain management for refill of prescriptions.         Geoffery Lyons, MD 03/04/11 2141

## 2011-03-04 NOTE — ED Notes (Signed)
Pt took 4 ativan around lunch time due to pain after running out of her fentanyl a few days ago

## 2011-03-04 NOTE — ED Notes (Signed)
Fentanyl patch removed by this nurse

## 2011-03-06 ENCOUNTER — Inpatient Hospital Stay (HOSPITAL_COMMUNITY): Payer: Medicare Other

## 2011-03-06 ENCOUNTER — Inpatient Hospital Stay (HOSPITAL_BASED_OUTPATIENT_CLINIC_OR_DEPARTMENT_OTHER)
Admission: EM | Admit: 2011-03-06 | Discharge: 2011-03-10 | DRG: 689 | Disposition: A | Discharge status: Home / Self Care | Payer: Medicare Other | Attending: Internal Medicine | Admitting: Internal Medicine

## 2011-03-06 ENCOUNTER — Emergency Department (INDEPENDENT_AMBULATORY_CARE_PROVIDER_SITE_OTHER): Payer: Medicare Other

## 2011-03-06 ENCOUNTER — Encounter (HOSPITAL_BASED_OUTPATIENT_CLINIC_OR_DEPARTMENT_OTHER): Payer: Self-pay

## 2011-03-06 DIAGNOSIS — F329 Major depressive disorder, single episode, unspecified: Secondary | ICD-10-CM

## 2011-03-06 DIAGNOSIS — E860 Dehydration: Secondary | ICD-10-CM

## 2011-03-06 DIAGNOSIS — N200 Calculus of kidney: Secondary | ICD-10-CM | POA: Diagnosis present

## 2011-03-06 DIAGNOSIS — N390 Urinary tract infection, site not specified: Principal | ICD-10-CM | POA: Diagnosis present

## 2011-03-06 DIAGNOSIS — R1110 Vomiting, unspecified: Secondary | ICD-10-CM

## 2011-03-06 DIAGNOSIS — R109 Unspecified abdominal pain: Secondary | ICD-10-CM

## 2011-03-06 DIAGNOSIS — G9349 Other encephalopathy: Secondary | ICD-10-CM | POA: Diagnosis not present

## 2011-03-06 DIAGNOSIS — E876 Hypokalemia: Secondary | ICD-10-CM | POA: Diagnosis not present

## 2011-03-06 DIAGNOSIS — Z79899 Other long term (current) drug therapy: Secondary | ICD-10-CM

## 2011-03-06 DIAGNOSIS — M79673 Pain in unspecified foot: Secondary | ICD-10-CM

## 2011-03-06 DIAGNOSIS — F319 Bipolar disorder, unspecified: Secondary | ICD-10-CM | POA: Diagnosis present

## 2011-03-06 HISTORY — DX: Reserved for concepts with insufficient information to code with codable children: IMO0002

## 2011-03-06 HISTORY — DX: Mental disorder, not otherwise specified: F99

## 2011-03-06 HISTORY — DX: Cardiac arrhythmia, unspecified: I49.9

## 2011-03-06 LAB — DIFFERENTIAL
Basophils Relative: 0 % (ref 0–1)
Eosinophils Absolute: 0 10*3/uL (ref 0.0–0.7)
Lymphs Abs: 1.4 10*3/uL (ref 0.7–4.0)
Monocytes Relative: 5 % (ref 3–12)
Neutro Abs: 6.6 10*3/uL (ref 1.7–7.7)
Neutrophils Relative %: 78 % — ABNORMAL HIGH (ref 43–77)

## 2011-03-06 LAB — URINE MICROSCOPIC-ADD ON

## 2011-03-06 LAB — COMPREHENSIVE METABOLIC PANEL
ALT: 20 U/L (ref 0–35)
AST: 19 U/L (ref 0–37)
Alkaline Phosphatase: 91 U/L (ref 39–117)
CO2: 25 mEq/L (ref 19–32)
Calcium: 10 mg/dL (ref 8.4–10.5)
GFR calc Af Amer: 78 mL/min — ABNORMAL LOW (ref >90)
Glucose, Bld: 126 mg/dL — ABNORMAL HIGH (ref 70–99)
Potassium: 3.8 mEq/L (ref 3.5–5.1)
Sodium: 141 mEq/L (ref 135–145)
Total Protein: 7.9 g/dL (ref 6.0–8.3)

## 2011-03-06 LAB — URINALYSIS, ROUTINE W REFLEX MICROSCOPIC
Hgb urine dipstick: NEGATIVE
Ketones, ur: 15 mg/dL — AB
Specific Gravity, Urine: 1.04 — ABNORMAL HIGH (ref 1.005–1.030)
pH: 5 (ref 5.0–8.0)

## 2011-03-06 LAB — CBC
Hemoglobin: 15.9 g/dL — ABNORMAL HIGH (ref 12.0–15.0)
Platelets: 197 10*3/uL (ref 150–400)
RBC: 5.62 MIL/uL — ABNORMAL HIGH (ref 3.87–5.11)

## 2011-03-06 LAB — PHOSPHORUS: Phosphorus: 3.5 mg/dL (ref 2.3–4.6)

## 2011-03-06 LAB — LIPASE, BLOOD: Lipase: 23 U/L (ref 11–59)

## 2011-03-06 MED ORDER — OXYCODONE-ACETAMINOPHEN 5-325 MG PO TABS
2.0000 | ORAL_TABLET | ORAL | Status: DC | PRN
  Administered 2011-03-07 – 2011-03-10 (×9): 2 via ORAL
  Filled 2011-03-06 (×9): qty 2

## 2011-03-06 MED ORDER — QUETIAPINE FUMARATE 400 MG PO TABS
800.0000 mg | ORAL_TABLET | Freq: Every day | ORAL | Status: DC
  Administered 2011-03-06: 800 mg via ORAL
  Filled 2011-03-06 (×2): qty 2

## 2011-03-06 MED ORDER — MORPHINE SULFATE 2 MG/ML IJ SOLN
1.0000 mg | INTRAMUSCULAR | Status: DC | PRN
  Administered 2011-03-08: 1 mg via INTRAVENOUS
  Administered 2011-03-08: 13:00:00 via INTRAVENOUS
  Filled 2011-03-06 (×2): qty 1

## 2011-03-06 MED ORDER — SODIUM CHLORIDE 0.9 % IV SOLN
INTRAVENOUS | Status: DC
  Administered 2011-03-06 (×2): 1000 mL via INTRAVENOUS

## 2011-03-06 MED ORDER — GABAPENTIN 300 MG PO CAPS
300.0000 mg | ORAL_CAPSULE | Freq: Every day | ORAL | Status: DC
  Filled 2011-03-06: qty 1

## 2011-03-06 MED ORDER — PROMETHAZINE HCL 25 MG PO TABS: 25.0000 mg | ORAL_TABLET | Freq: Four times a day (QID) | ORAL | Status: DC | PRN

## 2011-03-06 MED ORDER — ONDANSETRON HCL 4 MG/2ML IJ SOLN: 4.0000 mg | Freq: Three times a day (TID) | INTRAMUSCULAR | Status: DC | PRN

## 2011-03-06 MED ORDER — ONDANSETRON HCL 4 MG/2ML IJ SOLN: 4.0000 mg | Freq: Once | INTRAMUSCULAR | Status: DC

## 2011-03-06 MED ORDER — PROMETHAZINE HCL 25 MG PO TABS: 25.0000 mg | ORAL_TABLET | ORAL | Status: DC | PRN

## 2011-03-06 MED ORDER — LORAZEPAM 1 MG PO TABS
2.0000 mg | ORAL_TABLET | Freq: Two times a day (BID) | ORAL | Status: DC | PRN
Start: 2011-03-06 — End: 2011-03-10
  Administered 2011-03-08 – 2011-03-09 (×2): 1 mg via ORAL
  Administered 2011-03-09: 2 mg via ORAL
  Filled 2011-03-06: qty 2
  Filled 2011-03-06 (×2): qty 1

## 2011-03-06 MED ORDER — SODIUM CHLORIDE 0.9 % IV SOLN: INTRAVENOUS | Status: DC

## 2011-03-06 MED ORDER — PROMETHAZINE HCL 25 MG/ML IJ SOLN
12.5000 mg | Freq: Once | INTRAMUSCULAR | Status: AC
  Administered 2011-03-06: 12.5 mg via INTRAVENOUS
  Filled 2011-03-06: qty 1

## 2011-03-06 MED ORDER — DEXTROSE 5 % IV SOLN
1.0000 g | INTRAVENOUS | Status: DC
  Administered 2011-03-06 – 2011-03-09 (×4): 1 g via INTRAVENOUS
  Filled 2011-03-06 (×5): qty 10

## 2011-03-06 MED ORDER — PROMETHAZINE HCL 25 MG/ML IJ SOLN
25.0000 mg | INTRAMUSCULAR | Status: DC | PRN
  Administered 2011-03-06: 25 mg via INTRAVENOUS
  Filled 2011-03-06: qty 1

## 2011-03-06 MED ORDER — FENTANYL 50 MCG/HR TD PT72
75.0000 ug | MEDICATED_PATCH | TRANSDERMAL | Status: DC
  Administered 2011-03-06: 75 ug via TRANSDERMAL
  Filled 2011-03-06: qty 3
  Filled 2011-03-06: qty 1

## 2011-03-06 MED ORDER — DEXTROSE 5 % IV SOLN
1.0000 g | Freq: Once | INTRAVENOUS | Status: AC
  Administered 2011-03-06: 1 g via INTRAVENOUS
  Filled 2011-03-06: qty 10

## 2011-03-06 MED ORDER — SODIUM CHLORIDE 0.9 % IV BOLUS (SEPSIS)
500.0000 mL | Freq: Once | INTRAVENOUS | Status: AC
  Administered 2011-03-06: 500 mL via INTRAVENOUS

## 2011-03-06 MED ORDER — ONDANSETRON HCL 4 MG/2ML IJ SOLN
4.0000 mg | Freq: Once | INTRAMUSCULAR | Status: AC
  Administered 2011-03-06: 4 mg via INTRAVENOUS
  Filled 2011-03-06: qty 2

## 2011-03-06 MED ORDER — ENOXAPARIN SODIUM 40 MG/0.4ML ~~LOC~~ SOLN
40.0000 mg | SUBCUTANEOUS | Status: DC
  Administered 2011-03-06 – 2011-03-09 (×4): 40 mg via SUBCUTANEOUS
  Filled 2011-03-06 (×6): qty 0.4

## 2011-03-06 MED ORDER — ZOLPIDEM TARTRATE 10 MG PO TABS: 10.0000 mg | ORAL_TABLET | Freq: Every evening | ORAL | Status: DC | PRN

## 2011-03-06 NOTE — ED Notes (Signed)
N/v ,decreased po since Sunday 12/16-was seen here 2 days ago for same

## 2011-03-06 NOTE — H&P (Signed)
PCP:  Thomos Lemons, DO, DO   DOA:  03/06/2011  2:29 PM  Chief Complaint:  Nausea, vomiting, abdominal pain  HPI:  Patient is a 45 y.o. female who was transferred from [facility] center to [facility] for further evaluation of her nausea, vomiting, poor oral intake and generalized abdominal pain that radiates to her flanks. This has started approximately 3-4 days prior to admission and has been getting progressively worse with no specific aggravating or alleviating factors. Pt did not try any OTC medications. She reports similar episodes of pain in the past and the most recent one 2-3 weeks ago for which she has been seen in ED as well. Pt denies fevers, chills, diarrhea, blood in stool, no dysuria, no hematuria but she does mentions urinary frequency and urgency. She denies other systemic symptom. No recent changes in medications.  Allergies: No Known Allergies  Prior to Admission medications   Medication Sig Start Date End Date Taking? Authorizing Provider  fentaNYL (DURAGESIC - DOSED MCG/HR) 75 MCG/HR Place 1 patch onto the skin every 3 (three) days.     Yes Historical Provider, MD  gabapentin (NEURONTIN) 300 MG capsule Take 300 mg by mouth daily.     Yes Historical Provider, MD  LORazepam (ATIVAN) 2 MG tablet Take 4 mg by mouth daily.    Yes Historical Provider, MD  oxyCODONE-acetaminophen (PERCOCET) 5-325 MG per tablet Take 2 tablets by mouth daily as needed. For pain  03/04/11 03/14/11 Yes Geoffery Lyons, MD  promethazine (PHENERGAN) 25 MG tablet Take 1 tablet (25 mg total) by mouth every 6 (six) hours as needed for nausea. 03/04/11 03/11/11 Yes Geoffery Lyons, MD  QUEtiapine (SEROQUEL) 400 MG tablet Take 800 mg by mouth at bedtime.     Yes Historical Provider, MD  zolpidem (AMBIEN) 10 MG tablet Take 10 mg by mouth at bedtime as needed. For sleep    Yes Historical Provider, MD    Past Medical History  Diagnosis Date  . Bipolar 1 disorder   . Headache   . Bipolar 1 disorder   . Renal disorder   .  Kidney stones     Past Surgical History  Procedure Date  . Vaginal hysterectomy   . Cholecystectomy   . Abdominal hysterectomy   . Foot surgery     Social History:  reports that she has never smoked. She does not have any smokeless tobacco history on file. She reports that she does not drink alcohol or use illicit drugs.  No family history on file.  Review of Systems:  Per HPI  Physical Exam:  Filed Vitals:   03/06/11 1416 03/06/11 1929 03/06/11 2040  BP: 111/91 104/67 103/66  Pulse: 111 86 82  Temp: 98.4 F (36.9 C) 98.4 F (36.9 C) 98.3 F (36.8 C)  TempSrc:  Oral   Resp: 20  20  Height: 5\' 7"  (1.702 m)    Weight: 83.915 kg (185 lb)    SpO2: 95% 97% 94%    Constitutional: Vital signs reviewed.  Patient is a well-developed and well-nourished in no acute distress and cooperative with exam. Alert and oriented x3.  Head: Normocephalic and atraumatic Ear: TM normal bilaterally Mouth: no erythema or exudates, MMM Eyes: PERRL, EOMI, conjunctivae normal, No scleral icterus.  Neck: Supple, Trachea midline normal ROM, No JVD, mass, thyromegaly, or carotid bruit present.  Cardiovascular: RRR, S1 normal, S2 normal, no MRG, pulses symmetric and intact bilaterally Pulmonary/Chest: CTAB, no wheezes, rales, or rhonchi Abdominal: Soft. Epigastric tenderness, non-distended, bowel sounds are  normal, no masses, organomegaly, or guarding present.  GU: flank area tenderness Musculoskeletal: No joint deformities, erythema, or stiffness, ROM full and no nontender Ext: no edema and no cyanosis, pulses palpable bilaterally (DP and PT) Hematology: no cervical, inginal, or axillary adenopathy.  Neurological: A&O x3, Strenght is normal and symmetric bilaterally, cranial nerve II-XII are grossly intact, no focal motor deficit, sensory intact to light touch bilaterally.  Skin: Warm, dry and intact. No rash, cyanosis, or clubbing.  Psychiatric: Normal mood and affect. speech and behavior is  normal. Judgment and thought content normal. Cognition and memory are normal.   Labs on Admission:  Results for orders placed during the hospital encounter of 03/06/11 (from the past 48 hour(s))  CBC     Status: Abnormal   Collection Time   03/06/11  3:45 PM      Component Value Range Comment   WBC 8.4  4.0 - 10.5 (K/uL)    RBC 5.62 (*) 3.87 - 5.11 (MIL/uL)    Hemoglobin 15.9 (*) 12.0 - 15.0 (g/dL)    HCT 78.2  95.6 - 21.3 (%)    MCV 80.4  78.0 - 100.0 (fL)    MCH 28.3  26.0 - 34.0 (pg)    MCHC 35.2  30.0 - 36.0 (g/dL)    RDW 08.6  57.8 - 46.9 (%)    Platelets 197  150 - 400 (K/uL)   DIFFERENTIAL     Status: Abnormal   Collection Time   03/06/11  3:45 PM      Component Value Range Comment   Neutrophils Relative 78 (*) 43 - 77 (%)    Neutro Abs 6.6  1.7 - 7.7 (K/uL)    Lymphocytes Relative 16  12 - 46 (%)    Lymphs Abs 1.4  0.7 - 4.0 (K/uL)    Monocytes Relative 5  3 - 12 (%)    Monocytes Absolute 0.5  0.1 - 1.0 (K/uL)    Eosinophils Relative 0  0 - 5 (%)    Eosinophils Absolute 0.0  0.0 - 0.7 (K/uL)    Basophils Relative 0  0 - 1 (%)    Basophils Absolute 0.0  0.0 - 0.1 (K/uL)   LIPASE, BLOOD     Status: Normal   Collection Time   03/06/11  3:45 PM      Component Value Range Comment   Lipase 23  11 - 59 (U/L)   COMPREHENSIVE METABOLIC PANEL     Status: Abnormal   Collection Time   03/06/11  3:45 PM      Component Value Range Comment   Sodium 141  135 - 145 (mEq/L)    Potassium 3.8  3.5 - 5.1 (mEq/L)    Chloride 102  96 - 112 (mEq/L)    CO2 25  19 - 32 (mEq/L)    Glucose, Bld 126 (*) 70 - 99 (mg/dL)    BUN 25 (*) 6 - 23 (mg/dL)    Creatinine, Ser 6.29  0.50 - 1.10 (mg/dL)    Calcium 52.8  8.4 - 10.5 (mg/dL)    Total Protein 7.9  6.0 - 8.3 (g/dL)    Albumin 5.0  3.5 - 5.2 (g/dL)    AST 19  0 - 37 (U/L)    ALT 20  0 - 35 (U/L)    Alkaline Phosphatase 91  39 - 117 (U/L)    Total Bilirubin 0.6  0.3 - 1.2 (mg/dL)    GFR calc non Af Amer 67 (*) >90 (mL/min)  GFR calc  Af Amer 78 (*) >90 (mL/min)   URINALYSIS, ROUTINE W REFLEX MICROSCOPIC     Status: Abnormal   Collection Time   03/06/11  4:38 PM      Component Value Range Comment   Color, Urine ORANGE (*) YELLOW  BIOCHEMICALS MAY BE AFFECTED BY COLOR   APPearance CLOUDY (*) CLEAR     Specific Gravity, Urine 1.040 (*) 1.005 - 1.030     pH 5.0  5.0 - 8.0     Glucose, UA NEGATIVE  NEGATIVE (mg/dL)    Hgb urine dipstick NEGATIVE  NEGATIVE     Bilirubin Urine LARGE (*) NEGATIVE     Ketones, ur 15 (*) NEGATIVE (mg/dL)    Protein, ur 30 (*) NEGATIVE (mg/dL)    Urobilinogen, UA 1.0  0.0 - 1.0 (mg/dL)    Nitrite NEGATIVE  NEGATIVE     Leukocytes, UA MODERATE (*) NEGATIVE    URINE MICROSCOPIC-ADD ON     Status: Abnormal   Collection Time   03/06/11  4:38 PM      Component Value Range Comment   Squamous Epithelial / LPF RARE  RARE     WBC, UA 11-20  <3 (WBC/hpf)    RBC / HPF 0-2  <3 (RBC/hpf)    Bacteria, UA MANY (*) RARE     Urine-Other MUCOUS PRESENT   HYALINE CASTS   Radiological Exams on Admission:  Dg Abd Acute W/chest 03/06/2011    IMPRESSION:  Negative acute abdomen series.    CT abdomen and pelvis 02/23/2011    IMPRESSION:  No renal, ureteral, or bladder calculi. No hydronephrosis.  No evidence of bowel obstruction. Normal appendix.  No CT findings to account for the patient's abdominal pain.  Assessment/Plan  Principal Problem:  *UTI (lower urinary tract infection) - worrisome for pyelonephritis given her pain in the flank area but she has had similar episode recently and CT abdomen was negative for acute evens, hydronephrosis, stones - will start pt on Ceftriaxone IV and provide IVF - continue supportive care with anti - emetics, analgesia for adequate pain control - obtain renal US  Active Problems:  Kidney stones - pt has history of stones but recent CT abdomen and pelvis did not indicate so (02/23/2011) - will check renal US   Bipolar 1 disorder - appears stable at this  time   Disposition - plan of care and diagnosis, diagnostic studies and test results were discussed with pt and family at bedside - pt and  family verbalized understanding  Time Spent on Admission: Over 30 minutes  MAGICK-Bodey Frizell 03/06/2011, 9:01 PM  [facility] Pager [phone]

## 2011-03-06 NOTE — ED Notes (Signed)
No vomiting since arrival here.  Remains oriented.  Husband at bedside.    Somewhat lethargic/sleepy but arouses easily and answers questions appropriately.    Report called to [facility] ,RN on 3000

## 2011-03-06 NOTE — ED Provider Notes (Signed)
History     CSN: [id number]  Arrival date & time 03/06/11  1403   First MD Initiated Contact with Patient 03/06/11 1505      Chief Complaint  Patient presents with  . Nausea  . Emesis    (Consider location/radiation/quality/duration/timing/severity/associated sxs/prior treatment) Patient is a 45 y.o. female presenting with vomiting. The history is provided by the patient.  Emesis  The problem has not changed since onset.The emesis has an appearance of stomach contents. There has been no fever. Associated symptoms include abdominal pain. Pertinent negatives include no diarrhea, no fever and no headaches.   patient has had nausea and vomiting since Sunday. She was seen here 2 days ago for the same. At that time she been out of her fentanyl patch. It was presumed that it may have been related to that. Lab work is reassuring at that time. She done some in the ER the nursing home. She states she's continued to have nausea and vomiting. No diarrhea. No fevers. Some mild abdominal cramping. No abdominal distention. She now has a femoral patch back on his not improved. She also recently started Ativan. No relief with Phenergan.  Past Medical History  Diagnosis Date  . Bipolar 1 disorder   . Headache   . Bipolar 1 disorder   . Renal disorder   . Kidney stones     Past Surgical History  Procedure Date  . Vaginal hysterectomy   . Cholecystectomy   . Abdominal hysterectomy   . Foot surgery     No family history on file.  History  Substance Use Topics  . Smoking status: Never Smoker   . Smokeless tobacco: Not on file  . Alcohol Use: No    OB History    Grav Para Term Preterm Abortions TAB SAB Ect Mult Living                  Review of Systems  Constitutional: Positive for appetite change and fatigue. Negative for fever and activity change.  HENT: Negative for neck stiffness.   Eyes: Negative for pain.  Respiratory: Negative for chest tightness and shortness of breath.     Cardiovascular: Negative for chest pain and leg swelling.  Gastrointestinal: Positive for nausea, vomiting and abdominal pain. Negative for diarrhea.  Genitourinary: Negative for flank pain.  Musculoskeletal: Negative for back pain.  Skin: Negative for rash.  Neurological: Negative for weakness, numbness and headaches.  Psychiatric/Behavioral: Negative for behavioral problems.    Allergies  Review of patient's allergies indicates no known allergies.  Home Medications   Current Outpatient Rx  Name Route Sig Dispense Refill  . FENTANYL 50 MCG/HR TD PT72 Transdermal Place 1 patch onto the skin every 3 (three) days.      Marland Kitchen GABAPENTIN 300 MG PO CAPS Oral Take 300 mg by mouth daily.      Marland Kitchen LORAZEPAM 2 MG PO TABS Oral Take 8 mg by mouth once.      . OXYCODONE-ACETAMINOPHEN 5-325 MG PO TABS Oral Take 2 tablets by mouth every 4 (four) hours as needed for pain. 10 tablet 0  . PROMETHAZINE HCL 25 MG PO TABS Oral Take 1 tablet (25 mg total) by mouth every 6 (six) hours as needed for nausea. 10 tablet 0  . QUETIAPINE FUMARATE 400 MG PO TABS Oral Take 800 mg by mouth at bedtime.      Marland Kitchen ZOLPIDEM TARTRATE 10 MG PO TABS Oral Take 10 mg by mouth at bedtime as needed. For sleep  BP 111/91  Pulse 111  Temp 98.4 F (36.9 C)  Resp 20  Ht 5\' 7"  (1.702 m)  Wt 185 lb (83.915 kg)  BMI 28.97 kg/m2  SpO2 95%  Physical Exam  Nursing note and vitals reviewed. Constitutional: She is oriented to person, place, and time. She appears well-developed and well-nourished.       And uncomfortable appearing  HENT:  Head: Normocephalic and atraumatic.  Eyes: EOM are normal. Pupils are equal, round, and reactive to light.  Neck: Normal range of motion. Neck supple.  Cardiovascular: Normal rate and normal heart sounds.   No murmur heard.      tachycardia  Pulmonary/Chest: Effort normal and breath sounds normal. No respiratory distress. She has no wheezes. She has no rales.  Abdominal: Soft. Bowel sounds  are normal. She exhibits no distension. There is no tenderness. There is no rebound and no guarding.  Musculoskeletal: Normal range of motion.  Neurological: She is alert and oriented to person, place, and time. No cranial nerve deficit.  Skin: Skin is warm and dry.  Psychiatric: She has a normal mood and affect. Her speech is normal.    ED Course  Procedures (including critical care time)  Labs Reviewed  CBC - Abnormal; Notable for the following:    RBC 5.62 (*)    Hemoglobin 15.9 (*)    All other components within normal limits  DIFFERENTIAL - Abnormal; Notable for the following:    Neutrophils Relative 78 (*)    All other components within normal limits  COMPREHENSIVE METABOLIC PANEL - Abnormal; Notable for the following:    Glucose, Bld 126 (*)    BUN 25 (*)    GFR calc non Af Amer 67 (*)    GFR calc Af Amer 78 (*)    All other components within normal limits  URINALYSIS, ROUTINE W REFLEX MICROSCOPIC - Abnormal; Notable for the following:    Color, Urine ORANGE (*) BIOCHEMICALS MAY BE AFFECTED BY COLOR   APPearance CLOUDY (*)    Specific Gravity, Urine 1.040 (*)    Bilirubin Urine LARGE (*)    Ketones, ur 15 (*)    Protein, ur 30 (*)    Leukocytes, UA MODERATE (*)    All other components within normal limits  URINE MICROSCOPIC-ADD ON - Abnormal; Notable for the following:    Bacteria, UA MANY (*)    All other components within normal limits  LIPASE, BLOOD  URINE CULTURE   Dg Abd Acute W/chest  03/06/2011  *RADIOLOGY REPORT*  Clinical Data: Vomiting.  ACUTE ABDOMEN SERIES (ABDOMEN 2 VIEW & CHEST 1 VIEW)  Comparison: CT abdomen pelvis without contrast 02/23/2011.  Findings: Normal heart size with clear lung fields.  No bony abnormality.  No free air.  Moderate stool burden. No bowel obstruction.  Cholecystectomy.  No abnormal calcifications.  Bones unremarkable.  IMPRESSION: Negative acute abdomen series.  Original Report Authenticated By: Elsie Stain, M.D.     1.  UTI (urinary tract infection)   2. Dehydration   3. Vomiting       MDM  Patient has nausea vomiting. She seen a couple days ago for the same. She was off her opiates that time which considered a cause. She is now back on them is continued to vomit. Her lab work shows some dehydration, with an increased hemoglobin and increased BUN. She also appears to have a urinary tract infection. She continues to vomit. She'll be admitted to the hospital.  Juliet Rude. Rubin Payor, MD 03/06/11 1818

## 2011-03-07 ENCOUNTER — Encounter (HOSPITAL_COMMUNITY): Payer: Self-pay

## 2011-03-07 LAB — CBC
HCT: 39.2 % (ref 36.0–46.0)
MCHC: 33.9 g/dL (ref 30.0–36.0)
MCV: 84.1 fL (ref 78.0–100.0)
RDW: 13.9 % (ref 11.5–15.5)

## 2011-03-07 LAB — BASIC METABOLIC PANEL
BUN: 17 mg/dL (ref 6–23)
CO2: 25 mEq/L (ref 19–32)
Chloride: 107 mEq/L (ref 96–112)
Creatinine, Ser: 0.82 mg/dL (ref 0.50–1.10)
GFR calc Af Amer: >90 mL/min (ref >90)

## 2011-03-07 LAB — RAPID URINE DRUG SCREEN, HOSP PERFORMED
Amphetamines: NOT DETECTED
Cocaine: NOT DETECTED
Opiates: POSITIVE — AB

## 2011-03-07 MED ORDER — QUETIAPINE FUMARATE 400 MG PO TABS
400.0000 mg | ORAL_TABLET | Freq: Every day | ORAL | Status: DC
  Administered 2011-03-07 – 2011-03-08 (×2): 400 mg via ORAL
  Filled 2011-03-07 (×3): qty 1

## 2011-03-07 MED ORDER — POTASSIUM CHLORIDE 10 MEQ/100ML IV SOLN
10.0000 meq | INTRAVENOUS | Status: AC
  Administered 2011-03-07 (×2): 10 meq via INTRAVENOUS
  Filled 2011-03-07 (×3): qty 100

## 2011-03-07 MED ORDER — KCL IN DEXTROSE-NACL 20-5-0.45 MEQ/L-%-% IV SOLN
INTRAVENOUS | Status: DC
  Administered 2011-03-07 – 2011-03-08 (×2): 100 mL/h via INTRAVENOUS
  Administered 2011-03-09 (×2): via INTRAVENOUS
  Filled 2011-03-07 (×10): qty 1000

## 2011-03-07 NOTE — Progress Notes (Signed)
Subjective: Patient still complaining of abdominal pain. Relates urinary frequency. No  More vomiting. She is sleepy but arousable, able to carry a conversation.   Objective: Filed Vitals:   03/06/11 2040 03/06/11 2200 03/07/11 0500 03/07/11 0600  BP: 103/66 107/77  99/66  Pulse: 82 85  96  Temp: 98.3 F (36.8 C) 98.5 F (36.9 C)  97.3 F (36.3 C)  TempSrc:      Resp: 20 16  16   Height:   5\' 7"  (1.702 m)   Weight:   83.915 kg (185 lb)   SpO2: 94% 94%  95%   Weight change:   Intake/Output Summary (Last 24 hours) at 03/07/11 1040 Last data filed at 03/07/11 0630  Gross per 24 hour  Intake 1718.75 ml  Output      0 ml  Net 1718.75 ml    General: Alert, awake, oriented x3, in no acute distress.  HEENT: No bruits, no goiter.  Heart: Regular rate and rhythm, without murmurs, rubs, gallops.  Lungs: Crackles left side, bilateral air movement.  Abdomen: Soft, nontender, nondistended, positive bowel sounds.  Neuro: Grossly intact, nonfocal. Extremity: no edema.   Lab Results:  [facility] 03/07/11 0700 03/06/11 2220 03/06/11 1545  NA 143 -- 141  K 3.3* -- 3.8  CL 107 -- 102  CO2 25 -- 25  GLUCOSE 95 -- 126*  BUN 17 -- 25*  CREATININE 0.82 -- 1.00  CALCIUM 9.0 -- 10.0  MG -- 2.0 --  PHOS -- 3.5 --    [facility] 03/06/11 1545  AST 19  ALT 20  ALKPHOS 91  BILITOT 0.6  PROT 7.9  ALBUMIN 5.0    Basename 03/06/11 1545  LIPASE 23  AMYLASE --    Basename 03/07/11 0700 03/06/11 1545 03/04/11 2039  WBC 4.1 8.4 --  NEUTROABS -- 6.6 4.4  HGB 13.3 15.9* --  HCT 39.2 45.2 --  MCV 84.1 80.4 --  PLT 149* 197 --   Micro Results: No results found for this or any previous visit (from the past 240 hour(s)).  Studies/Results: US Renal  03/06/2011  *RADIOLOGY REPORT*  Clinical Data: Abdominal pain, evaluate for pyelonephritis  RENAL/URINARY TRACT ULTRASOUND COMPLETE  Comparison:  CT abdomen pelvis dated 02/23/2011  Findings:  Right Kidney:  Measures 10.4 cm.  No mass or  hydronephrosis.  Left Kidney:  Measures 11.0 cm.  No mass or hydronephrosis.  Bladder:  Within normal limits.  IMPRESSION: Normal sonographic appearance of the kidneys and bladder.  Original Report Authenticated By: Charline Bills, M.D.   Dg Abd Acute W/chest  03/06/2011  *RADIOLOGY REPORT*  Clinical Data: Vomiting.  ACUTE ABDOMEN SERIES (ABDOMEN 2 VIEW & CHEST 1 VIEW)  Comparison: CT abdomen pelvis without contrast 02/23/2011.  Findings: Normal heart size with clear lung fields.  No bony abnormality.  No free air.  Moderate stool burden. No bowel obstruction.  Cholecystectomy.  No abnormal calcifications.  Bones unremarkable.  IMPRESSION: Negative acute abdomen series.  Original Report Authenticated By: Elsie Stain, M.D.    Medications: I have reviewed the patient's current medications.   Patient Active Hospital Problem List:  UTI (lower urinary tract infection) (03/06/2011) Continue with ceftriaxone day 2. Will follow urine culture. I will check blood culture.  Check GC/Chlamidia.   Bipolar 1 disorder ()  Continue with Seroquel, but will hold dose tonight if patient remain sleepy during the day. Discussed with nurse.  I will decrease Seroquel dose.  Kidney stones () [place] negative. Had CT scan 1 week ago  negative.  AMS:  Patient sleepy, but arausable. I will dc fentanyl patch, gabapentin. Hold Seroquel tonight if patient remain sleepy. Blood sugar this am at 90. I will add D5 to IV fluids.  Hypokalemia: I will add Kcl to IV fluids. I will replete with 10 Meq IV times 2 runs. Check Mg in am.    LOS: 1 day   Jodel Mayhall M.D.  [facility] 03/07/2011, 10:40 AM

## 2011-03-07 NOTE — Progress Notes (Signed)
   CARE MANAGEMENT NOTE 03/07/2011  Patient:  [name], [name]   Account Number:  [id number]  Date Initiated:  03/07/2011  Documentation initiated by:  Letha Cape  Subjective/Objective Assessment:   dx uti  admit- lives with spouse, pta independent.     Action/Plan:   Anticipated DC Date:  03/10/2011   Anticipated DC Plan:  HOME/SELF CARE      DC Planning Services  CM consult      Choice offered to / List presented to:             Status of service:  In process, will continue to follow Medicare Important Message given?   (If response is "NO", the following Medicare IM given date fields will be blank) Date Medicare IM given:   Date Additional Medicare IM given:    Discharge Disposition:    Per UR Regulation:    Comments:  PCP Thomos Lemons  03/07/11 14:26 Letha Cape RN, BSN [phone] Patient lives with spouse, pta independent.  NCM will continue to follow for dc needs.

## 2011-03-08 LAB — URINE CULTURE

## 2011-03-08 MED ORDER — FENTANYL 50 MCG/HR TD PT72
75.0000 ug | MEDICATED_PATCH | TRANSDERMAL | Status: DC
  Administered 2011-03-08: 75 ug via TRANSDERMAL
  Filled 2011-03-08: qty 1

## 2011-03-08 MED ORDER — ONDANSETRON HCL 4 MG/2ML IJ SOLN
4.0000 mg | Freq: Four times a day (QID) | INTRAMUSCULAR | Status: DC | PRN
  Administered 2011-03-08 – 2011-03-09 (×3): 4 mg via INTRAVENOUS
  Filled 2011-03-08 (×3): qty 2

## 2011-03-08 MED ORDER — MAGNESIUM OXIDE 400 MG PO TABS
200.0000 mg | ORAL_TABLET | Freq: Two times a day (BID) | ORAL | Status: DC
  Administered 2011-03-08: 18:00:00 via ORAL
  Administered 2011-03-08 – 2011-03-09 (×2): 200 mg via ORAL
  Filled 2011-03-08 (×4): qty 0.5

## 2011-03-08 MED ORDER — POTASSIUM CHLORIDE CRYS ER 20 MEQ PO TBCR
40.0000 meq | EXTENDED_RELEASE_TABLET | Freq: Once | ORAL | Status: AC
  Administered 2011-03-08: 40 meq via ORAL
  Filled 2011-03-08: qty 2

## 2011-03-08 MED ORDER — HYDROMORPHONE HCL PF 1 MG/ML IJ SOLN
1.0000 mg | INTRAMUSCULAR | Status: DC | PRN
  Administered 2011-03-08 – 2011-03-09 (×5): 1 mg via INTRAVENOUS
  Filled 2011-03-08 (×5): qty 1

## 2011-03-08 NOTE — Progress Notes (Signed)
Subjective: Feeling better today, more awake. Abdominal pain improved no only 4/10 before 7/10. Nausea improved. Tolerating clears diet.  Objective: Filed Vitals:   03/07/11 0600 03/07/11 1400 03/07/11 2200 03/08/11 0638  BP: 99/66 101/66 107/74 97/67  Pulse: 96 99 93 94  Temp: 97.3 F (36.3 C) 97.4 F (36.3 C) 98.6 F (37 C) 98.8 F (37.1 C)  TempSrc:  Oral Oral Oral  Resp: 16 16 20 20   Height:      Weight:      SpO2: 95% 96% 98% 97%   Weight change:  No intake or output data in the 24 hours ending 03/08/11 1320  General: Alert, awake, oriented x3, in no acute distress.  HEENT: No bruits, no goiter.  Heart: Regular rate and rhythm, without murmurs, rubs, gallops.  Lungs: CTA, bilateral air movement.  Abdomen: Soft, nontender, nondistended, positive bowel sounds.  Neuro: Grossly intact, nonfocal. Extremities; no edema.    Lab Results:  Basename 03/08/11 0500 03/07/11 0700 03/06/11 2220 03/06/11 1545  NA -- 143 -- 141  K -- 3.3* -- 3.8  CL -- 107 -- 102  CO2 -- 25 -- 25  GLUCOSE -- 95 -- 126*  BUN -- 17 -- 25*  CREATININE -- 0.82 -- 1.00  CALCIUM -- 9.0 -- 10.0  MG 1.8 -- 2.0 --  PHOS -- -- 3.5 --    [facility] 03/06/11 1545  AST 19  ALT 20  ALKPHOS 91  BILITOT 0.6  PROT 7.9  ALBUMIN 5.0    Basename 03/06/11 1545  LIPASE 23  AMYLASE --    Basename 03/07/11 0700 03/06/11 1545  WBC 4.1 8.4  NEUTROABS -- 6.6  HGB 13.3 15.9*  HCT 39.2 45.2  MCV 84.1 80.4  PLT 149* 197    Basename 03/06/11 2220  HGBA1C 5.5    Micro Results: Recent Results (from the past 240 hour(s))  URINE CULTURE     Status: Normal   Collection Time   03/06/11  9:05 PM      Component Value Range Status Comment   Specimen Description URINE, CLEAN CATCH   Final    Special Requests NONE   Final    Setup Time [id number]   Final    Colony Count >=100,000 COLONIES/ML   Final    Culture     Final    Value: Multiple bacterial morphotypes present, none predominant. Suggest  appropriate recollection if clinically indicated.   Report Status 03/08/2011 FINAL   Final   CULTURE, BLOOD (ROUTINE X 2)     Status: Normal (Preliminary result)   Collection Time   03/07/11 10:58 AM      Component Value Range Status Comment   Specimen Description BLOOD RIGHT ANTECUBITAL   Final    Special Requests BOTTLES DRAWN AEROBIC AND ANAEROBIC 10CC   Final    Setup Time [id number]   Final    Culture     Final    Value:        BLOOD CULTURE RECEIVED NO GROWTH TO DATE CULTURE WILL BE HELD FOR 5 DAYS BEFORE ISSUING A FINAL NEGATIVE REPORT   Report Status PENDING   Incomplete   CULTURE, BLOOD (ROUTINE X 2)     Status: Normal (Preliminary result)   Collection Time   03/07/11 11:04 AM      Component Value Range Status Comment   Specimen Description BLOOD RIGHT HAND   Final    Special Requests BOTTLES DRAWN AEROBIC AND ANAEROBIC 10CC   Final  Setup Time [id number]   Final    Culture     Final    Value:        BLOOD CULTURE RECEIVED NO GROWTH TO DATE CULTURE WILL BE HELD FOR 5 DAYS BEFORE ISSUING A FINAL NEGATIVE REPORT   Report Status PENDING   Incomplete     Studies/Results: US Renal  03/06/2011  *RADIOLOGY REPORT*  Clinical Data: Abdominal pain, evaluate for pyelonephritis  RENAL/URINARY TRACT ULTRASOUND COMPLETE  Comparison:  CT abdomen pelvis dated 02/23/2011  Findings:  Right Kidney:  Measures 10.4 cm.  No mass or hydronephrosis.  Left Kidney:  Measures 11.0 cm.  No mass or hydronephrosis.  Bladder:  Within normal limits.  IMPRESSION: Normal sonographic appearance of the kidneys and bladder.  Original Report Authenticated By: Charline Bills, M.D.   Dg Abd Acute W/chest  03/06/2011  *RADIOLOGY REPORT*  Clinical Data: Vomiting.  ACUTE ABDOMEN SERIES (ABDOMEN 2 VIEW & CHEST 1 VIEW)  Comparison: CT abdomen pelvis without contrast 02/23/2011.  Findings: Normal heart size with clear lung fields.  No bony abnormality.  No free air.  Moderate stool burden. No bowel obstruction.   Cholecystectomy.  No abnormal calcifications.  Bones unremarkable.  IMPRESSION: Negative acute abdomen series.  Original Report Authenticated By: Elsie Stain, M.D.    Medications: I have reviewed the patient's current medications.  Abdominal pain likely secondary to pyelonephritis, UTI.   UTI (lower urinary tract infection) (03/06/2011) Continue with ceftriaxone day 3. urine culture 100,00 colonies multiple bacterial  morphotype. .  Blood culture no growth Check GC/Chlamidia pending.   Bipolar 1 disorder () Continue with Seroquel, but will hold dose tonight if patient remain sleepy during the day.   Seroquel dose decreased.  Kidney stones  [place] negative. Had CT scan 1 week ago negative.  AMS: More awake. Restart fentanyl. Continue with decreased dose Seroquel.  Hypokalemia: will give 40 meq times 1. Will give magnesium oxide.    LOS: 2 days   Anjel Pardo M.D.  [facility] 03/08/2011, 1:20 PM

## 2011-03-09 LAB — CBC
HCT: 34.8 % — ABNORMAL LOW (ref 36.0–46.0)
Hemoglobin: 11.8 g/dL — ABNORMAL LOW (ref 12.0–15.0)
MCHC: 33.9 g/dL (ref 30.0–36.0)
RBC: 4.18 MIL/uL (ref 3.87–5.11)

## 2011-03-09 LAB — BASIC METABOLIC PANEL
BUN: 6 mg/dL (ref 6–23)
CO2: 27 mEq/L (ref 19–32)
Chloride: 108 mEq/L (ref 96–112)
GFR calc non Af Amer: 82 mL/min — ABNORMAL LOW (ref >90)
Glucose, Bld: 104 mg/dL — ABNORMAL HIGH (ref 70–99)
Potassium: 3.6 mEq/L (ref 3.5–5.1)

## 2011-03-09 LAB — URINE CULTURE: Culture  Setup Time: 201212211758

## 2011-03-09 MED ORDER — QUETIAPINE FUMARATE 400 MG PO TABS
800.0000 mg | ORAL_TABLET | Freq: Every day | ORAL | Status: DC
  Administered 2011-03-09: 800 mg via ORAL
  Filled 2011-03-09 (×2): qty 2

## 2011-03-09 NOTE — Progress Notes (Signed)
Subjective: Relates abdominal pain has improved, tolerating soft diet. sHe relates abdominal pain for month on and of.  Objective: Filed Vitals:   03/08/11 0638 03/08/11 1400 03/08/11 2201 03/09/11 0525  BP: 97/67 110/72 111/76 98/61  Pulse: 94 75 79 82  Temp: 98.8 F (37.1 C) 97.9 F (36.6 C) 97.6 F (36.4 C) 97.6 F (36.4 C)  TempSrc: Oral Oral Oral Oral  Resp: 20 24 20 18   Height:      Weight:      SpO2: 97% 95% 94% 95%   Weight change:   Intake/Output Summary (Last 24 hours) at 03/09/11 1149 Last data filed at 03/09/11 0942  Gross per 24 hour  Intake    120 ml  Output      0 ml  Net    120 ml    General: Alert, awake, oriented x3, in no acute distress.  HEENT: No bruits, no goiter.  Heart: Regular rate and rhythm, without murmurs, rubs, gallops.  Lungs: CTA, bilateral air movement.  Abdomen: Soft, nontender, nondistended, positive bowel sounds.  Neuro: Grossly intact, nonfocal.  Extremities; no edema.     Lab Results:  Basename 03/09/11 0500 03/08/11 0500 03/07/11 0700 03/06/11 2220  NA 143 -- 143 --  K 3.6 -- 3.3* --  CL 108 -- 107 --  CO2 27 -- 25 --  GLUCOSE 104* -- 95 --  BUN 6 -- 17 --  CREATININE 0.85 -- 0.82 --  CALCIUM 9.1 -- 9.0 --  MG -- 1.8 -- 2.0  PHOS -- -- -- 3.5    Basename 03/06/11 1545  AST 19  ALT 20  ALKPHOS 91  BILITOT 0.6  PROT 7.9  ALBUMIN 5.0    Basename 03/06/11 1545  LIPASE 23  AMYLASE --    Basename 03/09/11 0500 03/07/11 0700 03/06/11 1545  WBC 3.8* 4.1 --  NEUTROABS -- -- 6.6  HGB 11.8* 13.3 --  HCT 34.8* 39.2 --  MCV 83.3 84.1 --  PLT 129* 149* --    Basename 03/06/11 2220  HGBA1C 5.5    Micro Results: Recent Results (from the past 240 hour(s))  URINE CULTURE     Status: Normal   Collection Time   03/06/11  9:05 PM      Component Value Range Status Comment   Specimen Description URINE, CLEAN CATCH   Final    Special Requests NONE   Final    Setup Time [id number]   Final    Colony Count  >=100,000 COLONIES/ML   Final    Culture     Final    Value: Multiple bacterial morphotypes present, none predominant. Suggest appropriate recollection if clinically indicated.   Report Status 03/08/2011 FINAL   Final   CULTURE, BLOOD (ROUTINE X 2)     Status: Normal (Preliminary result)   Collection Time   03/07/11 10:58 AM      Component Value Range Status Comment   Specimen Description BLOOD RIGHT ANTECUBITAL   Final    Special Requests BOTTLES DRAWN AEROBIC AND ANAEROBIC 10CC   Final    Setup Time [id number]   Final    Culture     Final    Value:        BLOOD CULTURE RECEIVED NO GROWTH TO DATE CULTURE WILL BE HELD FOR 5 DAYS BEFORE ISSUING A FINAL NEGATIVE REPORT   Report Status PENDING   Incomplete   CULTURE, BLOOD (ROUTINE X 2)     Status: Normal (Preliminary result)   Collection Time  03/07/11 11:04 AM      Component Value Range Status Comment   Specimen Description BLOOD RIGHT HAND   Final    Special Requests BOTTLES DRAWN AEROBIC AND ANAEROBIC 10CC   Final    Setup Time [id number]   Final    Culture     Final    Value:        BLOOD CULTURE RECEIVED NO GROWTH TO DATE CULTURE WILL BE HELD FOR 5 DAYS BEFORE ISSUING A FINAL NEGATIVE REPORT   Report Status PENDING   Incomplete   URINE CULTURE     Status: Normal   Collection Time   03/07/11  5:29 PM      Component Value Range Status Comment   Specimen Description URINE, CLEAN CATCH   Final    Special Requests NONE   Final    Setup Time [id number]   Final    Colony Count 75,000 COLONIES/ML   Final    Culture     Final    Value: Multiple bacterial morphotypes present, none predominant. Suggest appropriate recollection if clinically indicated.   Report Status 03/09/2011 FINAL   Final     Studies/Results: No results found.  Medications: I have reviewed the patient's current medications.  Abdominal pain likely secondary to pyelonephritis, UTI. ?? Might need colonoscopy outpatient.   UTI (lower urinary tract infection)  (03/06/2011) Continue with ceftriaxone day 4. urine culture 75,000 colonies multiple bacterial morphotype. .  Blood culture no growth  Check GC/Chlamidia pending.   Bipolar 1 disorder () Continue with Seroquel,. Seroquel dose was decreased.  Kidney stones  [place] negative. Had CT scan 1 week ago negative.  AMS: More awake. Fentanyl restarted.  Seroquel change to home dose.Marland Kitchen  Hypokalemia: resolved.        LOS: 3 days   [name] M.D.  [facility] 03/09/2011, 11:49 AM

## 2011-03-10 LAB — BASIC METABOLIC PANEL
BUN: 9 mg/dL (ref 6–23)
CO2: 26 mEq/L (ref 19–32)
Chloride: 106 mEq/L (ref 96–112)
Creatinine, Ser: 0.92 mg/dL (ref 0.50–1.10)
Glucose, Bld: 103 mg/dL — ABNORMAL HIGH (ref 70–99)
Potassium: 3.9 mEq/L (ref 3.5–5.1)

## 2011-03-10 MED ORDER — OXYCODONE-ACETAMINOPHEN 5-325 MG PO TABS: 2.0000 | ORAL_TABLET | Freq: Three times a day (TID) | ORAL | Status: DC | PRN

## 2011-03-10 MED ORDER — LORAZEPAM 2 MG PO TABS: 2.0000 mg | ORAL_TABLET | Freq: Two times a day (BID) | ORAL | Status: DC | PRN

## 2011-03-10 MED ORDER — CIPROFLOXACIN HCL 500 MG PO TABS: 750.0000 mg | ORAL_TABLET | Freq: Two times a day (BID) | ORAL | Status: AC

## 2011-03-10 NOTE — Progress Notes (Signed)
Physical Therapy Evaluation Patient Details Name: [name] MRN: [id number] DOB: 1965/09/28 Today's Date: 03/10/2011  Problem List:  Patient Active Problem List  Diagnoses  . INSOMNIA, CHRONIC  . DEPRESSION  . HEADACHE  . CHEST PAIN  . MIGRAINES, HX OF  . Bipolar 1 disorder  . Kidney stones  . UTI (lower urinary tract infection)    Past Medical History:  Past Medical History  Diagnosis Date  . Bipolar 1 disorder   . Bipolar 1 disorder   . Dysrhythmia     Left sided heart diease  . Mental disorder   . Bipolar affective disorder, mixed, in partial or remission   . Headache   . Renal disorder   . Kidney stones    Past Surgical History:  Past Surgical History  Procedure Date  . Vaginal hysterectomy   . Cholecystectomy   . Abdominal hysterectomy   . Foot surgery     PT Assessment/Plan/Recommendation PT Assessment Clinical Impression Statement: Pt is 45 yo female who presented with abdominal pain which has improved siginificantly.  She has pre-existing limitations of balance and strength from an ankle/ foot injury, which are largely unchanged.  No new deficits noted with no PT f/u needed acutely or after d/c.  Encouraged pt to continue with exercises she has been given previously and educated her on some new balance activities.  PT Recommendation/Assessment: Patent does not need any further PT services No Skilled PT: Patient will have necessary level of assist by caregiver at discharge;Patient is modified independent with all activity/mobility PT Recommendation Follow Up Recommendations: None Equipment Recommended: None recommended by PT PT Goals  Acute Rehab PT Goals PT Goal Formulation:  (N/A) Time For Goal Achievement: Other (comment) (N/A)  PT Evaluation Precautions/Restrictions  Precautions Precautions: Fall Restrictions Weight Bearing Restrictions: No Prior Functioning  Home Living Lives With: Spouse;Son;Daughter (multiple children age 50 to  71) Receives Help From: Family (66 yo daughter available to help when husband at work) Type of Home: House Home Layout: Two level Alternate Level Stairs-Rails: Right Alternate Level Stairs-Number of Steps: 10 Home Access: Stairs to enter Entrance Stairs-Rails: None Entrance Stairs-Number of Steps: 2 Home Adaptive Equipment: None Prior Function Level of Independence: Independent with basic ADLs;Independent with homemaking with ambulation;Independent with transfers;Independent with gait Able to Take Stairs?: Yes Driving: Yes Vocation: On disability Leisure: Hobbies-yes (Comment) Comments: shopping Cognition Cognition Arousal/Alertness: Awake/alert Overall Cognitive Status: History of cognitive impairments History of Cognitive Impairment: Appears at baseline functioning Orientation Level: Oriented X4 Cognition - Other Comments: flat affect Sensation/Coordination Sensation Light Touch: Appears Intact Stereognosis: Appears Intact Hot/Cold: Not tested Proprioception: Impaired Detail Proprioception Impaired Details: Impaired RLE Additional Comments: diminshed right foot/ ankle from injury 12/11 and subsequent surgery 5/12 Coordination Gross Motor Movements are Fluid and Coordinated: No Fine Motor Movements are Fluid and Coordinated: Yes Coordination and Movement Description: mvmt patterns affected by restrictions at right foot and ankle Extremity Assessment RUE Assessment RUE Assessment: Within Functional Limits LUE Assessment LUE Assessment: Within Functional Limits RLE Assessment RLE Assessment: Exceptions to [facility] RLE AROM (degrees) RLE Overall AROM Comments: decreased df and inversion of right ankle RLE Strength RLE Overall Strength Comments: decreased strength right foot/ ankle LLE Assessment LLE Assessment: Within Functional Limits Mobility (including Balance) Bed Mobility Bed Mobility: Yes Supine to Sit: 7: Independent;HOB flat Sit to Supine - Left: 7:  Independent;HOB flat Transfers Transfers: Yes Sit to Stand: 6: Modified independent (Device/Increase time);From bed;Without upper extremity assist Stand to Sit: 6: Modified independent (Device/Increase time);Without upper  extremity assist;To bed Ambulation/Gait Ambulation/Gait: Yes Ambulation/Gait Assistance: 6: Modified independent (Device/Increase time) Ambulation Distance (Feet): 250 Feet Assistive device: None Gait Pattern: Step-through pattern;Decreased step length - left;Decreased stance time - right;Decreased dorsiflexion - right Gait velocity: decreased due to right ankle/ foot limitations Stairs: Yes Stairs Assistance: 6: Modified independent (Device/Increase time) Stair Management Technique: One rail Right;Alternating pattern;Step to pattern;Forwards (discussed descending with right foot ea time for pain mgmt) Number of Stairs: 10  Height of Stairs: 4  Wheelchair Mobility Wheelchair Mobility: No  Posture/Postural Control Posture/Postural Control: No significant limitations Balance Balance Assessed: Yes Dynamic Standing Balance Dynamic Standing - Level of Assistance: 6: Modified independent (Device/Increase time) Exercise  Other Exercises Other Exercises: gave pt right single limb stance exercises for strengthening and balance and taught her the progression of UE support to no UE support to alternate surface to adding dynamic activity such as bouncing ball.  Also encouraged pt to perform the other ankle exercises that she received with PT  End of Session PT - End of Session Equipment Utilized During Treatment: Gait belt Activity Tolerance: Patient tolerated treatment well Patient left: in bed;with call bell in reach;with family/visitor present Nurse Communication: Mobility status for ambulation General Behavior During Session: [facility] for tasks performed Cognition: [facility] for tasks performed  Obdulio Mash, [place]  [phone] 03/10/2011, 10:44 AM

## 2011-03-10 NOTE — Progress Notes (Signed)
   CARE MANAGEMENT NOTE 03/10/2011  Patient:  [name] [name]   Account Number:  [id number]  Date Initiated:  03/07/2011  Documentation initiated by:  Letha Cape  Subjective/Objective Assessment:   dx uti  admit- lives with spouse, pta independent.     Action/Plan:   PT eval   Anticipated DC Date:  03/10/2011   Anticipated DC Plan:  HOME/SELF CARE      DC Planning Services  CM consult      Choice offered to / List presented to:             Status of service:  Completed, signed off Medicare Important Message given?   (If response is "NO", the following Medicare IM given date fields will be blank) Date Medicare IM given:   Date Additional Medicare IM given:    Discharge Disposition:  HOME/SELF CARE  Per UR Regulation:  Reviewed for med. necessity/level of care/duration of stay  Comments:  PCP Thomos Lemons  03/10/11- 1300- Donn Pierini RN, BSN [phone] Pt for discharge today, no discharge needs, no PT recommendations  03/07/11 14:26 Letha Cape RN, BSN [phone] Patient lives with spouse, pta independent.  NCM will continue to follow for dc needs.

## 2011-03-10 NOTE — Discharge Summary (Signed)
Admit date: 03/06/2011 Discharge date: 03/10/2011  Primary Care Physician:  Thomos Lemons, DO, DO   Discharge Diagnoses:     Active Hospital Problems  Diagnoses Date Noted   . UTI (lower urinary tract infection), pyelonephritis.  03/06/2011   . Bipolar 1 disorder    . Kidney stones      DISCHARGE MEDICATION: Current Discharge Medication List    START taking these medications   Details  ciprofloxacin (CIPRO) 500 MG tablet Take 1.5 tablets (750 mg total) by mouth 2 (two) times daily. Qty: 10 tablet, Refills: 0      CONTINUE these medications which have CHANGED   Details  LORazepam (ATIVAN) 2 MG tablet Take 1 tablet (2 mg total) by mouth 2 (two) times daily as needed for anxiety. Qty: 30 tablet, Refills: 0      CONTINUE these medications which have NOT CHANGED   Details  fentaNYL (DURAGESIC - DOSED MCG/HR) 75 MCG/HR Place 1 patch onto the skin every 3 (three) days.      gabapentin (NEURONTIN) 300 MG capsule Take 300 mg by mouth daily.      oxyCODONE-acetaminophen (PERCOCET) 5-325 MG per tablet Take 2 tablets by mouth daily as needed. For pain     promethazine (PHENERGAN) 25 MG tablet Take 1 tablet (25 mg total) by mouth every 6 (six) hours as needed for nausea. Qty: 10 tablet, Refills: 0    QUEtiapine (SEROQUEL) 400 MG tablet Take 800 mg by mouth at bedtime.      zolpidem (AMBIEN) 10 MG tablet Take 10 mg by mouth at bedtime as needed. For sleep            Consults:     SIGNIFICANT DIAGNOSTIC STUDIES:  Ct Abdomen Pelvis Wo Contrast  02/23/2011  *RADIOLOGY REPORT*  Clinical Data: Flank pain, vomiting, history of renal stones. Status post cholecystectomy and partial hysterectomy.  CT ABDOMEN AND PELVIS WITHOUT CONTRAST  Technique:  Multidetector CT imaging of the abdomen and pelvis was performed following the standard protocol without intravenous contrast.  Comparison: 12/06/2010  Findings: Lung bases are clear.  Liver is notable for focal fat along the falciform  ligament.  Unenhanced spleen, pancreas, and adrenal glands within normal limits.  Status post cholecystectomy.  No intrahepatic or extrahepatic ductal dilatation.  Kidneys within normal limits.  No renal calculi or hydronephrosis.  No evidence of bowel obstruction.  Normal appendix.  No colonic wall thickening or inflammatory changes.  No evidence of abdominal aortic aneurysm.  No abdominopelvic ascites.  No suspicious abdominopelvic lymphadenopathy.  Status post hysterectomy.  No adnexal masses.  No ureteral or bladder calculi.  Mild degenerative changes of the visualized thoracolumbar spine.  IMPRESSION: No renal, ureteral, or bladder calculi.  No hydronephrosis.  No evidence of bowel obstruction. Normal appendix.  No CT findings to account for the patient's abdominal pain.  Original Report Authenticated By: Charline Bills, M.D.   US Renal  03/06/2011  *RADIOLOGY REPORT*  Clinical Data: Abdominal pain, evaluate for pyelonephritis  RENAL/URINARY TRACT ULTRASOUND COMPLETE  Comparison:  CT abdomen pelvis dated 02/23/2011  Findings:  Right Kidney:  Measures 10.4 cm.  No mass or hydronephrosis.  Left Kidney:  Measures 11.0 cm.  No mass or hydronephrosis.  Bladder:  Within normal limits.  IMPRESSION: Normal sonographic appearance of the kidneys and bladder.  Original Report Authenticated By: Charline Bills, M.D.   Dg Abd Acute W/chest  03/06/2011  *RADIOLOGY REPORT*  Clinical Data: Vomiting.  ACUTE ABDOMEN SERIES (ABDOMEN 2 VIEW & CHEST 1 VIEW)  Comparison: CT abdomen pelvis without contrast 02/23/2011.  Findings: Normal heart size with clear lung fields.  No bony abnormality.  No free air.  Moderate stool burden. No bowel obstruction.  Cholecystectomy.  No abnormal calcifications.  Bones unremarkable.  IMPRESSION: Negative acute abdomen series.  Original Report Authenticated By: Elsie Stain, M.D.      Recent Results (from the past 240 hour(s))  URINE CULTURE     Status: Normal   Collection Time    03/06/11  9:05 PM      Component Value Range Status Comment   Specimen Description URINE, CLEAN CATCH   Final    Special Requests NONE   Final    Setup Time [id number]   Final    Colony Count >=100,000 COLONIES/ML   Final    Culture     Final    Value: Multiple bacterial morphotypes present, none predominant. Suggest appropriate recollection if clinically indicated.   Report Status 03/08/2011 FINAL   Final   CULTURE, BLOOD (ROUTINE X 2)     Status: Normal (Preliminary result)   Collection Time   03/07/11 10:58 AM      Component Value Range Status Comment   Specimen Description BLOOD RIGHT ANTECUBITAL   Final    Special Requests BOTTLES DRAWN AEROBIC AND ANAEROBIC 10CC   Final    Setup Time [id number]   Final    Culture     Final    Value:        BLOOD CULTURE RECEIVED NO GROWTH TO DATE CULTURE WILL BE HELD FOR 5 DAYS BEFORE ISSUING A FINAL NEGATIVE REPORT   Report Status PENDING   Incomplete   CULTURE, BLOOD (ROUTINE X 2)     Status: Normal (Preliminary result)   Collection Time   03/07/11 11:04 AM      Component Value Range Status Comment   Specimen Description BLOOD RIGHT HAND   Final    Special Requests BOTTLES DRAWN AEROBIC AND ANAEROBIC 10CC   Final    Setup Time [id number]   Final    Culture     Final    Value:        BLOOD CULTURE RECEIVED NO GROWTH TO DATE CULTURE WILL BE HELD FOR 5 DAYS BEFORE ISSUING A FINAL NEGATIVE REPORT   Report Status PENDING   Incomplete   URINE CULTURE     Status: Normal   Collection Time   03/07/11  5:29 PM      Component Value Range Status Comment   Specimen Description URINE, CLEAN CATCH   Final    Special Requests NONE   Final    Setup Time [id number]   Final    Colony Count 75,000 COLONIES/ML   Final    Culture     Final    Value: Multiple bacterial morphotypes present, none predominant. Suggest appropriate recollection if clinically indicated.   Report Status 03/09/2011 FINAL   Final     BRIEF ADMITTING H & P: Patient is a 45  y.o. female who was transferred from [facility] center to [facility] for further evaluation of her nausea, vomiting, poor oral intake and generalized abdominal pain that radiates to her flanks. This has started approximately 3-4 days prior to admission and has been getting progressively worse with no specific aggravating or alleviating factors. Pt did not try any OTC medications. She reports similar episodes of pain in the past and the most recent one 2-3 weeks ago for which she has been seen in ED as well.  Pt denies fevers, chills, diarrhea, blood in stool, no dysuria, no hematuria but she does mentions urinary frequency and urgency. She denies other systemic symptom. No recent changes in medications.  Hospital Course;  Abdominal pain likely secondary to pyelonephritis, UTI.   UTI (lower urinary tract infection) (03/06/2011) Patient was treated with ceftriaxone for 5 days. She will be discharge on ciprofloxacin for 5 more days.  urine culture 75,000 colonies multiple bacterial morphotype. . Blood culture no growth. Her abdominal pain resolved.     Bipolar 1 disorder () Continue with Seroquel,. Initially Seroquel dose was decrease because of AMS. She was then discharge on her usual dose.  Kidney stones  [place] negative. Had CT scan 1 week ago negative.  Encephalopathy: Patient was more sleepy than usual, component of medications and infection. Her psychiatric medications were initially adjusted. Fentanyl was stopped. . Seroquel was subsequently change to home dose. Fentanyl was restarted.  Hypokalemia: resolved.    Disposition and Follow-up: Discharge Orders    Future Orders Please Complete By Expires   Diet general      Increase activity slowly        Follow-up Information    Follow up with Thomos Lemons, DO .          DISCHARGE EXAM:  General: Alert, awake, oriented x3, in no acute distress.  HEENT: No bruits, no goiter.  Heart: Regular rate and rhythm, without murmurs, rubs, gallops.  Lungs: CTA,  bilateral air movement.  Abdomen: Soft, nontender, nondistended, positive bowel sounds.  Neuro: Grossly intact, nonfocal.  Extremities; no edema.    Blood pressure 104/67, pulse 77, temperature 97.6 F (36.4 C), temperature source Oral, resp. rate 18, height 5\' 7"  (1.702 m), weight 83.915 kg (185 lb), SpO2 95.00%.   Basename 03/10/11 0500 03/09/11 0500 03/08/11 0500  NA 138 143 --  K 3.9 3.6 --  CL 106 108 --  CO2 26 27 --  GLUCOSE 103* 104* --  BUN 9 6 --  CREATININE 0.92 0.85 --  CALCIUM 8.9 9.1 --  MG -- -- 1.8  PHOS -- -- --    Basename 03/09/11 0500  WBC 3.8*  NEUTROABS --  HGB 11.8*  HCT 34.8*  MCV 83.3  PLT 129*    Signed: Spero Gunnels M.D. 03/10/2011, 1:08 PM

## 2011-03-13 LAB — CULTURE, BLOOD (ROUTINE X 2)
Culture  Setup Time: 201212211402
Culture: NO GROWTH

## 2011-03-16 ENCOUNTER — Encounter (HOSPITAL_COMMUNITY): Payer: Self-pay | Admitting: *Deleted

## 2011-03-16 ENCOUNTER — Emergency Department (HOSPITAL_COMMUNITY)
Admission: EM | Admit: 2011-03-16 | Discharge: 2011-03-16 | Disposition: A | Discharge status: Home / Self Care | Payer: Medicare Other | Source: Home / Self Care | Attending: Emergency Medicine | Admitting: Emergency Medicine

## 2011-03-16 DIAGNOSIS — M79673 Pain in unspecified foot: Secondary | ICD-10-CM

## 2011-03-16 DIAGNOSIS — R110 Nausea: Secondary | ICD-10-CM

## 2011-03-16 DIAGNOSIS — R51 Headache: Secondary | ICD-10-CM

## 2011-03-16 MED ORDER — HYDROMORPHONE HCL PF 1 MG/ML IJ SOLN
1.0000 mg | Freq: Once | INTRAMUSCULAR | Status: DC
  Filled 2011-03-16: qty 1

## 2011-03-16 MED ORDER — PROMETHAZINE HCL 25 MG/ML IJ SOLN: 25.0000 mg | Freq: Once | INTRAMUSCULAR | Status: DC

## 2011-03-16 MED ORDER — OXYCODONE-ACETAMINOPHEN 5-325 MG PO TABS: 1.0000 | ORAL_TABLET | Freq: Four times a day (QID) | ORAL | Status: AC | PRN

## 2011-03-16 MED ORDER — HYDROMORPHONE HCL PF 1 MG/ML IJ SOLN
1.0000 mg | Freq: Once | INTRAMUSCULAR | Status: AC
  Administered 2011-03-16: 1 mg via INTRAMUSCULAR
  Filled 2011-03-16: qty 1

## 2011-03-16 MED ORDER — HYDROMORPHONE HCL PF 1 MG/ML IJ SOLN
1.0000 mg | Freq: Once | INTRAMUSCULAR | Status: AC
  Administered 2011-03-16: 1 mg via INTRAMUSCULAR

## 2011-03-16 MED ORDER — PROMETHAZINE HCL 25 MG/ML IJ SOLN
12.5000 mg | Freq: Once | INTRAMUSCULAR | Status: AC
  Administered 2011-03-16: 12.5 mg via INTRAMUSCULAR
  Filled 2011-03-16: qty 1

## 2011-03-16 MED ORDER — PROMETHAZINE HCL 25 MG/ML IJ SOLN
12.5000 mg | Freq: Once | INTRAMUSCULAR | Status: DC
  Filled 2011-03-16: qty 1

## 2011-03-16 MED ORDER — PROMETHAZINE HCL 25 MG/ML IJ SOLN
12.5000 mg | Freq: Once | INTRAMUSCULAR | Status: AC
  Administered 2011-03-16: 12.5 mg via INTRAMUSCULAR

## 2011-03-16 NOTE — ED Provider Notes (Signed)
Medical screening examination/treatment/procedure(s) were performed by non-physician practitioner and as supervising physician I was immediately available for consultation/collaboration.  Doug Sou, MD 03/16/11 2342

## 2011-03-16 NOTE — ED Provider Notes (Signed)
History     CSN: [id number]  Arrival date & time 03/16/11  1442   First MD Initiated Contact with Patient 03/16/11 1628      Chief Complaint  Patient presents with  . Foot Pain  . Migraine  . Nausea    (Consider location/radiation/quality/duration/timing/severity/associated sxs/prior treatment) HPI Comments: Patient has a history of migraine headaches.  She comes in today with a chief complaint of headache.  Headache has been present than last evening and is unchanged.  She reports that this headache is no different than headaches that she has had in the past.  She reports that she is normally given Dilaudid and Phenergan in the ED for the pain, which helps.  She tried taking Percocet for the pain, which did not help.  She has not taken any pain medication today. She is also having pain in her left foot.  She reports that she has had foot surgery in the past and when she was recently hospitalized last week for a UTI she did some extensive PT and has had the pain since then.  The pain is no different today than it was then.  Patient is a 45 y.o. female presenting with migraine. The history is provided by the patient.  Migraine This is a new problem. The current episode started yesterday. Associated symptoms include headaches, nausea and vomiting. Pertinent negatives include no abdominal pain, change in bowel habit, chest pain, chills, diaphoresis, fever, neck pain, numbness, rash, sore throat, urinary symptoms or visual change. The symptoms are aggravated by nothing. Treatments tried: Tried taking Percocet last evening with no relief.    Past Medical History  Diagnosis Date  . Bipolar 1 disorder   . Bipolar 1 disorder   . Dysrhythmia     Left sided heart diease  . Mental disorder   . Bipolar affective disorder, mixed, in partial or remission   . Headache   . Renal disorder   . Kidney stones     Past Surgical History  Procedure Date  . Vaginal hysterectomy   . Cholecystectomy    . Abdominal hysterectomy   . Foot surgery     No family history on file.  History  Substance Use Topics  . Smoking status: Never Smoker   . Smokeless tobacco: Not on file  . Alcohol Use: No    OB History    Grav Para Term Preterm Abortions TAB SAB Ect Mult Living                  Review of Systems  Constitutional: Negative for fever, chills and diaphoresis.  HENT: Negative for sore throat, neck pain and neck stiffness.   Eyes: Positive for photophobia. Negative for pain, discharge, redness and visual disturbance.  Respiratory: Negative for shortness of breath.   Cardiovascular: Negative for chest pain.  Gastrointestinal: Positive for nausea and vomiting. Negative for abdominal pain and change in bowel habit.  Genitourinary: Negative for dysuria.  Skin: Negative for rash.  Neurological: Positive for headaches. Negative for dizziness, syncope, light-headedness and numbness.  Psychiatric/Behavioral: Negative for confusion.    Allergies  Review of patient's allergies indicates no known allergies.  Home Medications   Current Outpatient Rx  Name Route Sig Dispense Refill  . CIPROFLOXACIN HCL 500 MG PO TABS Oral Take 1.5 tablets (750 mg total) by mouth 2 (two) times daily. 10 tablet 0  . FENTANYL 75 MCG/HR TD PT72 Transdermal Place 1 patch onto the skin every 3 (three) days.      Marland Kitchen  GABAPENTIN 300 MG PO CAPS Oral Take 300 mg by mouth daily.      . OXYCODONE-ACETAMINOPHEN 5-325 MG PO TABS Oral Take 2 tablets by mouth every 8 (eight) hours as needed. For pain 30 tablet 0  . QUETIAPINE FUMARATE 400 MG PO TABS Oral Take 800 mg by mouth at bedtime.      Marland Kitchen ZOLPIDEM TARTRATE 10 MG PO TABS Oral Take 10 mg by mouth at bedtime as needed. For sleep       BP 115/84  Pulse 97  Temp(Src) 98.3 F (36.8 C) (Oral)  Resp 16  SpO2 97%  Physical Exam  Nursing note and vitals reviewed. Constitutional: She is oriented to person, place, and time. She appears well-developed and  well-nourished.  HENT:  Head: Normocephalic and atraumatic.  Eyes: EOM are normal. Pupils are equal, round, and reactive to light.  Neck: Normal range of motion. Neck supple.  Cardiovascular: Normal rate, regular rhythm and normal heart sounds.   Pulmonary/Chest: Effort normal and breath sounds normal.  Abdominal: Soft. Bowel sounds are normal.  Musculoskeletal: Normal range of motion.       Patient has a well healed scar on the medial portion of her right foot.  No erythema, edema, tenderness to palpation, or warmth to palpation of right foot.  Patient able to walk and bear weight.  Neurological: She is alert and oriented to person, place, and time. She has normal strength and normal reflexes. No cranial nerve deficit or sensory deficit. Coordination and gait normal.  Skin: Skin is warm and dry. No rash noted.  Psychiatric: She has a normal mood and affect.    ED Course  Procedures (including critical care time)  Labs Reviewed - No data to display No results found.   No diagnosis found.   7:50 PM  Reassessed patient.  She reports that her pain has improved somewhat, but is still present.  She is requesting more pain medication.    8:20 PM Patient signed out to Ivonne Andrew, PA-C who assumes care of patient.  Plan is for patient to be discharged once the pain has improved. MDM  Patient has a history of migraine headaches.  She comes in today with a migraine headache.  She reports that the pain today is no different than the pain that she has had in the past.  No fever, neck pain, neck stiffness, changes in vision, or focal neurological deficits.  Normal neurological exam.  Therefore, do not feel that any imaging is indicated at this time.  Will treat symptoms and discharge home.        Pascal Lux Wingen 03/16/11 2023  Pascal Lux Wingen 03/21/11 1141

## 2011-03-16 NOTE — ED Notes (Signed)
Pt presents with c/o foot pain, chronic. Out of fentanyl patches. Cannot take percocets, reports vomiting these. C/o migraine with n/v, light and sound sensitivity. Vomited x3.

## 2011-03-16 NOTE — ED Provider Notes (Signed)
Avaneesh Pepitone S 8:00PM Pt discussed in signout.  Pt with typical migraine headache with no concerning red flags.  Pt also with foot and ankle complaints with scheduled follow up with orthopedic specialist.    She has received a second dose of pain medications. She appears in no acute distress. She reports slight nausea but this is baseline for her. She has Phenergan suppositories at home. Patient states that she is ready to leave this time.  Angus Seller, PA 03/16/11 2030

## 2011-03-18 ENCOUNTER — Encounter (HOSPITAL_COMMUNITY): Payer: Self-pay | Admitting: *Deleted

## 2011-03-18 ENCOUNTER — Emergency Department (HOSPITAL_COMMUNITY)
Admission: EM | Admit: 2011-03-18 | Discharge: 2011-03-18 | Disposition: A | Discharge status: Other Acute Inpatient Hospital | Payer: Medicare Other | Attending: Emergency Medicine | Admitting: Emergency Medicine

## 2011-03-18 ENCOUNTER — Inpatient Hospital Stay (HOSPITAL_COMMUNITY)
Admission: AD | Admit: 2011-03-18 | Discharge: 2011-03-31 | DRG: 885 | Disposition: A | Discharge status: Home / Self Care | Payer: Medicare Other | Source: Ambulatory Visit | Attending: Psychiatry | Admitting: Psychiatry

## 2011-03-18 DIAGNOSIS — R109 Unspecified abdominal pain: Secondary | ICD-10-CM | POA: Insufficient documentation

## 2011-03-18 DIAGNOSIS — G4700 Insomnia, unspecified: Secondary | ICD-10-CM

## 2011-03-18 DIAGNOSIS — F319 Bipolar disorder, unspecified: Secondary | ICD-10-CM

## 2011-03-18 DIAGNOSIS — F1120 Opioid dependence, uncomplicated: Secondary | ICD-10-CM | POA: Insufficient documentation

## 2011-03-18 DIAGNOSIS — F259 Schizoaffective disorder, unspecified: Principal | ICD-10-CM

## 2011-03-18 DIAGNOSIS — F19939 Other psychoactive substance use, unspecified with withdrawal, unspecified: Secondary | ICD-10-CM

## 2011-03-18 DIAGNOSIS — Z79899 Other long term (current) drug therapy: Secondary | ICD-10-CM | POA: Insufficient documentation

## 2011-03-18 DIAGNOSIS — M79609 Pain in unspecified limb: Secondary | ICD-10-CM | POA: Insufficient documentation

## 2011-03-18 DIAGNOSIS — R110 Nausea: Secondary | ICD-10-CM | POA: Insufficient documentation

## 2011-03-18 DIAGNOSIS — F250 Schizoaffective disorder, bipolar type: Secondary | ICD-10-CM | POA: Diagnosis present

## 2011-03-18 DIAGNOSIS — R45851 Suicidal ideations: Secondary | ICD-10-CM

## 2011-03-18 DIAGNOSIS — F1910 Other psychoactive substance abuse, uncomplicated: Secondary | ICD-10-CM

## 2011-03-18 DIAGNOSIS — F1110 Opioid abuse, uncomplicated: Secondary | ICD-10-CM

## 2011-03-18 DIAGNOSIS — Z87442 Personal history of urinary calculi: Secondary | ICD-10-CM

## 2011-03-18 DIAGNOSIS — R1110 Vomiting, unspecified: Secondary | ICD-10-CM | POA: Insufficient documentation

## 2011-03-18 HISTORY — DX: Depression, unspecified: F32.A

## 2011-03-18 HISTORY — DX: Major depressive disorder, single episode, unspecified: F32.9

## 2011-03-18 HISTORY — DX: Anxiety disorder, unspecified: F41.9

## 2011-03-18 LAB — CBC
HCT: 36.8 % (ref 36.0–46.0)
Hemoglobin: 12.9 g/dL (ref 12.0–15.0)
MCH: 29.1 pg (ref 26.0–34.0)
MCHC: 35.1 g/dL (ref 30.0–36.0)
MCV: 83.1 fL (ref 78.0–100.0)
RBC: 4.43 MIL/uL (ref 3.87–5.11)

## 2011-03-18 LAB — COMPREHENSIVE METABOLIC PANEL
Albumin: 4 g/dL (ref 3.5–5.2)
Alkaline Phosphatase: 106 U/L (ref 39–117)
BUN: 10 mg/dL (ref 6–23)
Chloride: 103 mEq/L (ref 96–112)
Creatinine, Ser: 0.89 mg/dL (ref 0.50–1.10)
GFR calc Af Amer: 89 mL/min — ABNORMAL LOW (ref >90)
Glucose, Bld: 137 mg/dL — ABNORMAL HIGH (ref 70–99)
Potassium: 3.8 mEq/L (ref 3.5–5.1)
Total Bilirubin: 0.2 mg/dL — ABNORMAL LOW (ref 0.3–1.2)
Total Protein: 7.1 g/dL (ref 6.0–8.3)

## 2011-03-18 LAB — DIFFERENTIAL
Basophils Relative: 0 % (ref 0–1)
Eosinophils Absolute: 0 10*3/uL (ref 0.0–0.7)
Lymphs Abs: 0.9 10*3/uL (ref 0.7–4.0)
Monocytes Absolute: 0.2 10*3/uL (ref 0.1–1.0)
Monocytes Relative: 4 % (ref 3–12)

## 2011-03-18 LAB — SALICYLATE LEVEL: Salicylate Lvl: <2 mg/dL — ABNORMAL LOW (ref 2.8–20.0)

## 2011-03-18 LAB — URINALYSIS, ROUTINE W REFLEX MICROSCOPIC
Glucose, UA: NEGATIVE mg/dL
Leukocytes, UA: NEGATIVE
Nitrite: NEGATIVE
Specific Gravity, Urine: 1.046 — ABNORMAL HIGH (ref 1.005–1.030)
pH: 5 (ref 5.0–8.0)

## 2011-03-18 LAB — URINE MICROSCOPIC-ADD ON

## 2011-03-18 LAB — RAPID URINE DRUG SCREEN, HOSP PERFORMED
Amphetamines: NOT DETECTED
Opiates: NOT DETECTED

## 2011-03-18 LAB — ETHANOL: Alcohol, Ethyl (B): <11 mg/dL (ref 0–11)

## 2011-03-18 MED ORDER — NAPROXEN 500 MG PO TABS
500.0000 mg | ORAL_TABLET | Freq: Two times a day (BID) | ORAL | Status: AC | PRN
  Administered 2011-03-19 – 2011-03-23 (×6): 500 mg via ORAL
  Filled 2011-03-18 (×7): qty 1

## 2011-03-18 MED ORDER — DICYCLOMINE HCL 20 MG PO TABS: 20.0000 mg | ORAL_TABLET | ORAL | Status: AC | PRN

## 2011-03-18 MED ORDER — GABAPENTIN 300 MG PO CAPS
300.0000 mg | ORAL_CAPSULE | Freq: Every day | ORAL | Status: DC
  Administered 2011-03-19: 300 mg via ORAL
  Filled 2011-03-18: qty 1

## 2011-03-18 MED ORDER — CIPROFLOXACIN HCL 750 MG PO TABS
750.0000 mg | ORAL_TABLET | Freq: Two times a day (BID) | ORAL | Status: AC
  Administered 2011-03-18 – 2011-03-21 (×6): 750 mg via ORAL
  Filled 2011-03-18: qty 3
  Filled 2011-03-18 (×2): qty 1
  Filled 2011-03-18: qty 3
  Filled 2011-03-18 (×4): qty 1

## 2011-03-18 MED ORDER — CLONIDINE HCL 0.1 MG PO TABS: 0.1000 mg | ORAL_TABLET | Freq: Every day | ORAL | Status: DC

## 2011-03-18 MED ORDER — ACETAMINOPHEN 325 MG PO TABS: 650.0000 mg | ORAL_TABLET | ORAL | Status: DC | PRN

## 2011-03-18 MED ORDER — TRAZODONE HCL 150 MG PO TABS
150.0000 mg | ORAL_TABLET | Freq: Every day | ORAL | Status: DC
  Administered 2011-03-18 – 2011-03-19 (×2): 150 mg via ORAL
  Filled 2011-03-18: qty 3
  Filled 2011-03-18 (×3): qty 1

## 2011-03-18 MED ORDER — METHOCARBAMOL 500 MG PO TABS
500.0000 mg | ORAL_TABLET | Freq: Three times a day (TID) | ORAL | Status: AC | PRN
  Administered 2011-03-18 – 2011-03-23 (×4): 500 mg via ORAL
  Filled 2011-03-18 (×4): qty 1

## 2011-03-18 MED ORDER — ALUM & MAG HYDROXIDE-SIMETH 200-200-20 MG/5ML PO SUSP: 30.0000 mL | ORAL | Status: DC | PRN

## 2011-03-18 MED ORDER — IBUPROFEN 200 MG PO TABS: 600.0000 mg | ORAL_TABLET | Freq: Three times a day (TID) | ORAL | Status: DC | PRN

## 2011-03-18 MED ORDER — HYDROXYZINE HCL 25 MG PO TABS
25.0000 mg | ORAL_TABLET | Freq: Four times a day (QID) | ORAL | Status: AC | PRN
  Administered 2011-03-18 – 2011-03-23 (×11): 25 mg via ORAL
  Filled 2011-03-18 (×3): qty 1

## 2011-03-18 MED ORDER — LOPERAMIDE HCL 2 MG PO CAPS: 2.0000 mg | ORAL_CAPSULE | ORAL | Status: AC | PRN

## 2011-03-18 MED ORDER — ACETAMINOPHEN 325 MG PO TABS
650.0000 mg | ORAL_TABLET | Freq: Four times a day (QID) | ORAL | Status: DC | PRN
  Administered 2011-03-19 – 2011-03-30 (×12): 650 mg via ORAL

## 2011-03-18 MED ORDER — ZOLPIDEM TARTRATE 5 MG PO TABS: 5.0000 mg | ORAL_TABLET | Freq: Every evening | ORAL | Status: DC | PRN

## 2011-03-18 MED ORDER — CLONIDINE HCL 0.1 MG PO TABS: 0.1000 mg | ORAL_TABLET | ORAL | Status: DC

## 2011-03-18 MED ORDER — MAGNESIUM HYDROXIDE 400 MG/5ML PO SUSP: 30.0000 mL | Freq: Every day | ORAL | Status: DC | PRN

## 2011-03-18 MED ORDER — CLONIDINE HCL 0.1 MG PO TABS
0.1000 mg | ORAL_TABLET | Freq: Four times a day (QID) | ORAL | Status: DC
  Administered 2011-03-18 – 2011-03-19 (×5): 0.1 mg via ORAL
  Filled 2011-03-18 (×8): qty 1

## 2011-03-18 MED ORDER — NICOTINE 21 MG/24HR TD PT24: 21.0000 mg | MEDICATED_PATCH | Freq: Every day | TRANSDERMAL | Status: DC

## 2011-03-18 MED ORDER — ONDANSETRON 4 MG PO TBDP
4.0000 mg | ORAL_TABLET | Freq: Four times a day (QID) | ORAL | Status: AC | PRN
  Administered 2011-03-19 – 2011-03-22 (×4): 4 mg via ORAL
  Filled 2011-03-18 (×2): qty 1

## 2011-03-18 MED ORDER — ONDANSETRON HCL 4 MG PO TABS
4.0000 mg | ORAL_TABLET | Freq: Three times a day (TID) | ORAL | Status: DC | PRN
  Filled 2011-03-18: qty 1

## 2011-03-18 MED ORDER — LORAZEPAM 1 MG PO TABS
1.0000 mg | ORAL_TABLET | Freq: Three times a day (TID) | ORAL | Status: DC | PRN
  Administered 2011-03-18: 1 mg via ORAL
  Filled 2011-03-18: qty 1

## 2011-03-18 NOTE — ED Notes (Signed)
Report called to Santina Evans, RN at [facility]; [address]

## 2011-03-18 NOTE — ED Provider Notes (Signed)
History     CSN: [id number]  Arrival date & time 03/18/11  1140   First MD Initiated Contact with Patient 03/18/11 1220     12:50 PM HPI Patient reports a history of foot pain that began a year ago. Reports she was prescribed but no patches and Percocet. 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 States our prior to coming to the ED to 6 Percocets. States last night took approximately 8 Percocets. Also reports noncompliance with fentanyl patches. States she is prescribed 10 patches and also by [place] them from [place]. Reports she will cut up the fentanyl patches and insert the rectally.states she feels she abuses her medication out of the desire to harm herself. Denies homicidal ideation, hallucinations or delusions.  Patient is a 46 y.o. female presenting with drug problem. The history is provided by the patient.  Drug Problem This is a chronic problem. The current episode started more than 1 year ago. The problem has been gradually worsening. Associated symptoms include nausea. Pertinent negatives include no abdominal pain, change in bowel habit, chest pain, chills, congestion, coughing, fatigue, fever, headaches, joint swelling, numbness, rash, vomiting or weakness. Associated symptoms comments: Suicidal ideation, harmful thoughts, foot pain..    Past Medical History  Diagnosis Date  . Bipolar 1 disorder   . Bipolar 1 disorder   . Dysrhythmia     Left sided heart diease  . Mental disorder   . Bipolar affective disorder, mixed, in partial or remission   . Headache   . Renal disorder   . Kidney stones     Past Surgical History  Procedure Date  . Vaginal hysterectomy   . Cholecystectomy   . Abdominal hysterectomy   . Foot surgery     No family history on file.  History  Substance Use Topics  . Smoking status: Never Smoker   . Smokeless tobacco: Not on file  . Alcohol Use: No    OB History    Grav Para Term Preterm Abortions TAB SAB Ect Mult Living                  Review of  Systems  Constitutional: Negative for fever, chills and fatigue.  HENT: Negative for congestion.   Respiratory: Negative for cough.   Cardiovascular: Negative for chest pain.  Gastrointestinal: Positive for nausea. Negative for vomiting, abdominal pain and change in bowel habit.  Musculoskeletal: Negative for joint swelling.       Foot pain  Skin: Negative for rash.  Neurological: Negative for weakness, numbness and headaches.  Psychiatric/Behavioral: Positive for suicidal ideas and behavioral problems. Negative for hallucinations and self-injury.  All other systems reviewed and are negative.    Allergies  Review of patient's allergies indicates no known allergies.  Home Medications   Current Outpatient Rx  Name Route Sig Dispense Refill  . CIPROFLOXACIN HCL 500 MG PO TABS Oral Take 1.5 tablets (750 mg total) by mouth 2 (two) times daily. 10 tablet 0  . FENTANYL 75 MCG/HR TD PT72 Transdermal Place 1 patch onto the skin every 3 (three) days.      Marland Kitchen GABAPENTIN 300 MG PO CAPS Oral Take 300 mg by mouth daily.      . OXYCODONE-ACETAMINOPHEN 5-325 MG PO TABS Oral Take 1-2 tablets by mouth every 6 (six) hours as needed for pain. 20 tablet 0  . QUETIAPINE FUMARATE 400 MG PO TABS Oral Take 800 mg by mouth at bedtime.      Marland Kitchen ZOLPIDEM TARTRATE 10 MG PO  TABS Oral Take 10 mg by mouth at bedtime. For sleep      BP 122/88  Pulse 104  Temp(Src) 98.2 F (36.8 C) (Oral)  Resp 18  SpO2 98%  Physical Exam  Constitutional: She is oriented to person, place, and time. Vital signs are normal. She appears well-developed and well-nourished.  HENT:  Head: Normocephalic and atraumatic.  Eyes: Conjunctivae are normal. Pupils are equal, round, and reactive to light.  Neck: Normal range of motion. Neck supple.  Cardiovascular: Normal rate, regular rhythm and normal heart sounds.  Exam reveals no friction rub.   No murmur heard. Pulmonary/Chest: Effort normal and breath sounds normal. She has no  wheezes. She has no rhonchi. She has no rales. She exhibits no tenderness.  Abdominal: Soft. Bowel sounds are normal. She exhibits no distension and no mass. There is no tenderness. There is no rebound and no guarding.  Musculoskeletal: Normal range of motion.  Neurological: She is alert and oriented to person, place, and time. Coordination normal.  Skin: Skin is warm and dry. No rash noted. No erythema. No pallor.  Psychiatric:       Flat affect    ED Course  Procedures   Results for orders placed during the hospital encounter of 03/18/11  ACETAMINOPHEN LEVEL      Component Value Range   Acetaminophen (Tylenol), Serum 22.9  10 - 30 (ug/mL)  CBC      Component Value Range   WBC 4.9  4.0 - 10.5 (K/uL)   RBC 4.43  3.87 - 5.11 (MIL/uL)   Hemoglobin 12.9  12.0 - 15.0 (g/dL)   HCT 96.2  95.2 - 84.1 (%)   MCV 83.1  78.0 - 100.0 (fL)   MCH 29.1  26.0 - 34.0 (pg)   MCHC 35.1  30.0 - 36.0 (g/dL)   RDW 32.4  40.1 - 02.7 (%)   Platelets 164  150 - 400 (K/uL)  DIFFERENTIAL      Component Value Range   Neutrophils Relative 78 (*) 43 - 77 (%)   Neutro Abs 3.8  1.7 - 7.7 (K/uL)   Lymphocytes Relative 18  12 - 46 (%)   Lymphs Abs 0.9  0.7 - 4.0 (K/uL)   Monocytes Relative 4  3 - 12 (%)   Monocytes Absolute 0.2  0.1 - 1.0 (K/uL)   Eosinophils Relative 0  0 - 5 (%)   Eosinophils Absolute 0.0  0.0 - 0.7 (K/uL)   Basophils Relative 0  0 - 1 (%)   Basophils Absolute 0.0  0.0 - 0.1 (K/uL)  COMPREHENSIVE METABOLIC PANEL      Component Value Range   Sodium 140  135 - 145 (mEq/L)   Potassium 3.8  3.5 - 5.1 (mEq/L)   Chloride 103  96 - 112 (mEq/L)   CO2 27  19 - 32 (mEq/L)   Glucose, Bld 137 (*) 70 - 99 (mg/dL)   BUN 10  6 - 23 (mg/dL)   Creatinine, Ser 2.53  0.50 - 1.10 (mg/dL)   Calcium 9.7  8.4 - 66.4 (mg/dL)   Total Protein 7.1  6.0 - 8.3 (g/dL)   Albumin 4.0  3.5 - 5.2 (g/dL)   AST 48 (*) 0 - 37 (U/L)   ALT 70 (*) 0 - 35 (U/L)   Alkaline Phosphatase 106  39 - 117 (U/L)   Total  Bilirubin 0.2 (*) 0.3 - 1.2 (mg/dL)   GFR calc non Af Amer 77 (*) >90 (mL/min)   GFR calc Af  Amer 89 (*) >90 (mL/min)  ETHANOL      Component Value Range   Alcohol, Ethyl (B) <11  0 - 11 (mg/dL)  URINE RAPID DRUG SCREEN (HOSP PERFORMED)      Component Value Range   Opiates NONE DETECTED  NONE DETECTED    Cocaine NONE DETECTED  NONE DETECTED    Benzodiazepines NONE DETECTED  NONE DETECTED    Amphetamines NONE DETECTED  NONE DETECTED    Tetrahydrocannabinol NONE DETECTED  NONE DETECTED    Barbiturates NONE DETECTED  NONE DETECTED   SALICYLATE LEVEL      Component Value Range   Salicylate Lvl <2.0 (*) 2.8 - 20.0 (mg/dL)  URINALYSIS, ROUTINE W REFLEX MICROSCOPIC      Component Value Range   Color, Urine AMBER (*) YELLOW    APPearance TURBID (*) CLEAR    Specific Gravity, Urine 1.046 (*) 1.005 - 1.030    pH 5.0  5.0 - 8.0    Glucose, UA NEGATIVE  NEGATIVE (mg/dL)   Hgb urine dipstick NEGATIVE  NEGATIVE    Bilirubin Urine SMALL (*) NEGATIVE    Ketones, ur TRACE (*) NEGATIVE (mg/dL)   Protein, ur NEGATIVE  NEGATIVE (mg/dL)   Urobilinogen, UA 0.2  0.0 - 1.0 (mg/dL)   Nitrite NEGATIVE  NEGATIVE    Leukocytes, UA NEGATIVE  NEGATIVE   URINE MICROSCOPIC-ADD ON      Component Value Range   Bacteria, UA RARE  RARE    Crystals CA OXALATE CRYSTALS (*) NEGATIVE    Urine-Other AMORPHOUS URATES/PHOSPHATES     Ct Abdomen Pelvis Wo Contrast  02/23/2011  *RADIOLOGY REPORT*  Clinical Data: Flank pain, vomiting, history of renal stones. Status post cholecystectomy and partial hysterectomy.  CT ABDOMEN AND PELVIS WITHOUT CONTRAST  Technique:  Multidetector CT imaging of the abdomen and pelvis was performed following the standard protocol without intravenous contrast.  Comparison: 12/06/2010  Findings: Lung bases are clear.  Liver is notable for focal fat along the falciform ligament.  Unenhanced spleen, pancreas, and adrenal glands within normal limits.  Status post cholecystectomy.  No intrahepatic or  extrahepatic ductal dilatation.  Kidneys within normal limits.  No renal calculi or hydronephrosis.  No evidence of bowel obstruction.  Normal appendix.  No colonic wall thickening or inflammatory changes.  No evidence of abdominal aortic aneurysm.  No abdominopelvic ascites.  No suspicious abdominopelvic lymphadenopathy.  Status post hysterectomy.  No adnexal masses.  No ureteral or bladder calculi.  Mild degenerative changes of the visualized thoracolumbar spine.  IMPRESSION: No renal, ureteral, or bladder calculi.  No hydronephrosis.  No evidence of bowel obstruction. Normal appendix.  No CT findings to account for the patient's abdominal pain.  Original Report Authenticated By: Charline Bills, M.D.   US Renal  03/06/2011  *RADIOLOGY REPORT*  Clinical Data: Abdominal pain, evaluate for pyelonephritis  RENAL/URINARY TRACT ULTRASOUND COMPLETE  Comparison:  CT abdomen pelvis dated 02/23/2011  Findings:  Right Kidney:  Measures 10.4 cm.  No mass or hydronephrosis.  Left Kidney:  Measures 11.0 cm.  No mass or hydronephrosis.  Bladder:  Within normal limits.  IMPRESSION: Normal sonographic appearance of the kidneys and bladder.  Original Report Authenticated By: Charline Bills, M.D.   Dg Abd Acute W/chest  03/06/2011  *RADIOLOGY REPORT*  Clinical Data: Vomiting.  ACUTE ABDOMEN SERIES (ABDOMEN 2 VIEW & CHEST 1 VIEW)  Comparison: CT abdomen pelvis without contrast 02/23/2011.  Findings: Normal heart size with clear lung fields.  No bony abnormality.  No free air.  Moderate stool burden. No bowel obstruction.  Cholecystectomy.  No abnormal calcifications.  Bones unremarkable.  IMPRESSION: Negative acute abdomen series.  Original Report Authenticated By: Elsie Stain, M.D.       MDM   spoke with [facility]. They will come and evaluate the patient for suicidal ideation and substance abuse.       Thomasene Lot, [place] 03/18/11 1437

## 2011-03-18 NOTE — ED Provider Notes (Signed)
Medical screening examination/treatment/procedure(s) were performed by non-physician practitioner and as supervising physician I was immediately available for consultation/collaboration.  Ethelda Chick, MD 03/18/11 [phone]

## 2011-03-18 NOTE — Progress Notes (Signed)
Patient ID: [name], female   DOB: Dec 10, 1965, 46 y.o.   MRN: [id number] 46 yo voluntary pt. Presenting with depression with thoughts of suicide. Pt has hx. Of bipolar d/o,Headaches,Kidney-stones,vaginal & abdominal hysterectomy, cholecystectomy,& foot surgery.Pt. Has been abusing percocet( approx. (20 percocet in past 24 hours) & cutting up Fentanyl patches172mcg & inserting them in the rectum daily. Pt. Was prescribed opiates for foot pain 1 year ago. Pt. Has been getting her medications from [place].Pt. Denies SI,HI, & AVH & this time.Pt. Has hx of a suicide attempt once before.COWS =4.NKA ;VSS. Pt. On 15 minute checks;oriented to room & unit. Supported & encouraged.

## 2011-03-18 NOTE — ED Notes (Signed)
Patient is awaiting transfer by tech and security to [facility].

## 2011-03-18 NOTE — BH Assessment (Signed)
Assessment Note   [name] is an 46 y.o. female. PT PRESENTS TO [facility] WITH HUSBAND AT BEDSIDE. PT PRESENTS WITH C/O SUBSTANCE ABUSE AND SI. PT REPORTS INCREASED DEPRESSION OVER THE PAST MONTH AND REPORTS A HX OF BEING DIAGNOSED AS BIPOLAR IN 2005. PT IS UNABLE TO CONTRACT FOR SAFETY AND IS REQUESTING INPATIENT PSYCHIATRIC AND SA DETOX ADMISSION. PT PRESENTS TEARFUL AND HOPELESS AND REPORTS THAT SHE IS "BACK DOWN THIS ROAD AGAIN".   PT REPORTS THAT SHE INJURED HER FOOT LAST December AND WAS PRESCRIBED PAIN MEDS TO MANAGE THE PAIN. PT REPORTS THAT SHE HAS BEEN ABUSING HER PERCOSET,VICODIN, AND FENTNAL PATCH DAILY. PT REPORTS THAT SHE LAST USED OVER 20 PERCOSET PILLS IN THE PAST 24 HOURS WITH LAST USE BEING TODAY AT 6AM. PT REPORTS TAKING 2(100 MCG)FENTNAL PATCH RECTALLY 2X DAILY. PT REPORTS THAT SHE IS PRESCRIBED THE PATCH AND STS THAT WHEN SHE RUNS OUT SHE ORDER  THEM FROM [place]. PT REPORTS LAST FENTNAL PATCH USE ON 12- 30-12. PT IS UNSURE OF LAST USE OF VICODIN WHEN ASKED PT STS "LONG TIME AGO". CONSULTED WITH PA BRIGITTE NGUYEN PA ABOUT PTS DISPOSITION. BRIGITTE  IS IN AGREEMENT WITH PT BEING ADMITTED TO [facility] FOR PSYCHIATRIC/DETOX ADMISSION. PT REFERRED TO [facility] FOR PSYCHIATRIC INPATIENT ADMISSION. FOLLOW UP REQUIRED BY ACT TEAM.  Axis I: Bipolar, Depressed,Opiate Abuse Axis II: Deferred Axis III:  Past Medical History  Diagnosis Date  . Bipolar 1 disorder   . Bipolar 1 disorder   . Dysrhythmia     Left sided heart diease  . Mental disorder   . Bipolar affective disorder, mixed, in partial or remission   . Headache   . Renal disorder   . Kidney stones   . Anxiety    Axis IV: other psychosocial or environmental problems, problems related to social environment and problems with primary support group Axis V: 31-40 impairment in reality testing  Past Medical History:  Past Medical History  Diagnosis Date  . Bipolar 1 disorder   . Bipolar 1 disorder   . Dysrhythmia     Left sided heart  diease  . Mental disorder   . Bipolar affective disorder, mixed, in partial or remission   . Headache   . Renal disorder   . Kidney stones   . Anxiety     Past Surgical History  Procedure Date  . Vaginal hysterectomy   . Cholecystectomy   . Abdominal hysterectomy   . Foot surgery     Family History: No family history on file.  Social History:  reports that she has never smoked. She does not have any smokeless tobacco history on file. She reports that she uses illicit drugs (Other-see comments). She reports that she does not drink alcohol.  Additional Social History:    Allergies: No Known Allergies  Home Medications:  Medications Prior to Admission  Medication Dose Route Frequency Provider Last Rate Last Dose  . acetaminophen (TYLENOL) tablet 650 mg  650 mg Oral Q4H PRN Thomasene Lot, PA      . alum & mag hydroxide-simeth (MAALOX/MYLANTA) 200-200-20 MG/5ML suspension 30 mL  30 mL Oral PRN Thomasene Lot, PA      . ibuprofen (ADVIL,MOTRIN) tablet 600 mg  600 mg Oral Q8H PRN Thomasene Lot, PA      . LORazepam (ATIVAN) tablet 1 mg  1 mg Oral Q8H PRN Thomasene Lot, PA      . nicotine (NICODERM CQ - dosed in mg/24 hours) patch 21 mg  21 mg Transdermal  Daily Thomasene Lot, [place]      . ondansetron [facility]) tablet 4 mg  4 mg Oral Q8H PRN Thomasene Lot, PA      . zolpidem (AMBIEN) tablet 5 mg  5 mg Oral QHS PRN Thomasene Lot, PA       Medications Prior to Admission  Medication Sig Dispense Refill  . ciprofloxacin (CIPRO) 500 MG tablet Take 1.5 tablets (750 mg total) by mouth 2 (two) times daily.  10 tablet  0  . fentaNYL (DURAGESIC - DOSED MCG/HR) 75 MCG/HR Place 1 patch onto the skin every 3 (three) days.        Marland Kitchen gabapentin (NEURONTIN) 300 MG capsule Take 300 mg by mouth daily.        Marland Kitchen oxyCODONE-acetaminophen (PERCOCET) 5-325 MG per tablet Take 1-2 tablets by mouth every 6 (six) hours as needed for pain.  20 tablet  0  . QUEtiapine (SEROQUEL) 400 MG tablet Take 800 mg by  mouth at bedtime.        Marland Kitchen zolpidem (AMBIEN) 10 MG tablet Take 10 mg by mouth at bedtime. For sleep        OB/GYN Status:  No LMP recorded. Patient has had a hysterectomy.  General Assessment Data Location of Assessment: [facility] Living Arrangements: Spouse/significant other;Children (5 children live in the home) Can pt return to current living arrangement?: Yes Admission Status: Voluntary Is patient capable of signing voluntary admission?: Yes Transfer from: Acute Hospital Referral Source: Other ([facility] )     Risk to self Suicidal Ideation: Yes-Currently Present Suicidal Intent: Yes-Currently Present Is patient at risk for suicide?: Yes Suicidal Plan?: No Access to Means: Yes (rx meds and meds bought off the street) Specify Access to Suicidal Means: access to meds What has been your use of drugs/alcohol within the last 12 months?: abusing pain meds Previous Attempts/Gestures: Yes How many times?: 1  Other Self Harm Risks: na Triggers for Past Attempts: Family contact (kids were taken away,pt signed custody over to father) Intentional Self Injurious Behavior: None Family Suicide History: No Recent stressful life event(s): Conflict (Comment) (conflict w/21 y/o daughter who ran away in the past) Persecutory voices/beliefs?: No Depression: Yes Depression Symptoms: Insomnia;Tearfulness;Guilt;Loss of interest in usual pleasures;Feeling worthless/self pity Substance abuse history and/or treatment for substance abuse?: Yes Suicide prevention information given to non-admitted patients: Not applicable  Risk to Others Homicidal Ideation: No Thoughts of Harm to Others: No Current Homicidal Intent: No Current Homicidal Plan: No Access to Homicidal Means: No Identified Victim: na History of harm to others?: No Assessment of Violence: None Noted Violent Behavior Description: none reported Does patient have access to weapons?: No Criminal Charges Pending?: No Does patient have a court  date: No  Psychosis Hallucinations: None noted Delusions: None noted  Mental Status Report Appear/Hygiene: Improved Eye Contact: Fair Motor Activity: Unremarkable Speech: Logical/coherent Level of Consciousness: Alert Mood: Depressed;Anxious Affect: Anxious;Appropriate to circumstance;Depressed Anxiety Level: Severe (pt shaking excessively, pt sts "it's time for meds" ) Thought Processes: Coherent Judgement: Impaired Orientation: Person;Place;Time;Situation Obsessive Compulsive Thoughts/Behaviors: None  Cognitive Functioning Concentration: Normal Memory: Recent Intact IQ: Average Insight: Fair Impulse Control: Poor Appetite: Poor Weight Loss: 30  Sleep: Decreased Total Hours of Sleep: 4  Vegetative Symptoms: Staying in bed;Not bathing;Decreased grooming  Prior Inpatient Therapy Prior Inpatient Therapy: Yes Prior Therapy Dates: 2001,2004,2005 Prior Therapy Facilty/Provider(s): [facility]-Detox,[facility]-SA/OD,[facility]-Psychiatric Reason for Treatment: SA,Detox,OD  Prior Outpatient Therapy Prior Outpatient Therapy: Yes Prior Therapy Dates: Current-Med Management Prior Therapy Facilty/Provider(s[place] Reason for Treatment: Bipolar D/O  ADL Screening (condition at time of admission) Patient's cognitive ability adequate to safely complete daily activities?: Yes Patient able to express need for assistance with ADLs?: Yes Independently performs ADLs?: Yes Communication: Independent Weakness of Legs: None Weakness of Arms/Hands: None  Home Assistive Devices/Equipment Home Assistive Devices/Equipment: None    Abuse/Neglect Assessment (Assessment to be complete while patient is alone) Physical Abuse: Denies Verbal Abuse: Denies Sexual Abuse: Denies Exploitation of patient/patient's resources: Denies Self-Neglect: Yes, present (Comment) (decreased bathing and grooming) Values / Beliefs Cultural  Requests During Hospitalization: None Spiritual Requests During Hospitalization: None   Advance Directives (For Healthcare) Advance Directive: Patient would not like information Nutrition Screen Unintentional weight loss greater than 10lbs within the last month: Yes (Comment) (Pt reports losing -30lbs within past 2 months)  Additional Information 1:1 In Past 12 Months?: No CIRT Risk: No Elopement Risk: No Does patient have medical clearance?: Yes     Disposition:  Disposition Disposition of Patient: Inpatient treatment program Type of inpatient treatment program: Adult (Pt referred to [facility] for inpatient Psych and Detox from pain m)  On Site Evaluation by:   Reviewed with Physician:     Bjorn Pippin 03/18/2011 4:35 PM

## 2011-03-18 NOTE — Tx Team (Signed)
Initial Interdisciplinary Treatment Plan  PATIENT STRENGTHS: (choose at least two) Ability for insight Average or above average intelligence Capable of independent living Communication skills Financial means Motivation for treatment/growth Religious Affiliation Supportive family/friends Work skills  PATIENT STRESSORS: Medication change or noncompliance Substance abuse   PROBLEM LIST: Problem List/Patient Goals Date to be addressed Date deferred Reason deferred Estimated date of resolution    Detox from opiates         depression                                                 DISCHARGE CRITERIA:  Ability to meet basic life and health needs Adequate post-discharge living arrangements Improved stabilization in mood, thinking, and/or behavior Reduction of life-threatening or endangering symptoms to within safe limits Verbal commitment to aftercare and medication compliance  PRELIMINARY DISCHARGE PLAN: Attend 12-step recovery group Participate in family therapy Return to previous living arrangement  PATIENT/FAMIILY INVOLVEMENT: This treatment plan has been presented to and reviewed with the patient, [name], and/or family member,   The patient and family have been given the opportunity to ask questions and make suggestions.  [name] 03/18/2011, 9:28 PM

## 2011-03-18 NOTE — ED Notes (Addendum)
Requesting inpatient admission at [facility], substance abuse and depression, an hour ago 6 Percocet and 2 Robaxin, 20 Percocet in last 24 hours, no ETOH, Fentynal  2 patches a day rectal, Pain management prescribes 10 and she buys rest from [place]. Pt felt like she might hurt self earlier,  No plan

## 2011-03-19 MED ORDER — QUETIAPINE FUMARATE 400 MG PO TABS
800.0000 mg | ORAL_TABLET | Freq: Every day | ORAL | Status: DC
  Administered 2011-03-19 – 2011-03-22 (×4): 800 mg via ORAL
  Filled 2011-03-19 (×6): qty 2

## 2011-03-19 MED ORDER — GABAPENTIN 300 MG PO CAPS
300.0000 mg | ORAL_CAPSULE | Freq: Three times a day (TID) | ORAL | Status: DC
  Administered 2011-03-19 – 2011-03-23 (×12): 300 mg via ORAL
  Filled 2011-03-19 (×19): qty 1

## 2011-03-19 NOTE — H&P (Signed)
Psychiatric Admission Assessment Adult  Patient Identification:  [name] Date of Evaluation:  03/19/2011 Chief Complaint:  bipolar disorder oplate abuse  History of Present Illness:: [name] is a 46 year old Caucasian female. Admitted from the [facility]. Patient reports, I came here last night. I am here for my depression and substance abuse issues. I became depressed one year ago after my 82 year old daughter ran away and we could not find her. When we finally found her, she still refused to come home. She went to live with her boyfriend. As I was trying to get her stuff shipped to her, I fell on my porch and broke my Rt. Foot. My doctor put me on pain medications for my foot. As I go to him for follow-up visits, this doctor has no time to listen to me on how I was doing. Rather, he will give me more prescription for more pain pills and said take this for your pain. I was already battling substance abuse problems while this is going on.  At this time, I was abusing more and more pills. I have been through numerous substance abuse treatments at different centers. I lost my 6 children as a result of my substance abuse issues. I became more depressed for losing my kids. I came here because I was having suicidal thoughts. I did not try to hurt myself, but I was afraid I might do it"   Mood Symptoms:  Depression Guilt Helplessness Hopelessness Sadness SI Depression Symptoms:  depressed mood (Hypo) Manic Symptoms:   Elevated Mood:  No Irritable Mood:  Yes Grandiosity:  No Distractibility:  No Labiality of Mood:  No Delusions:  No Hallucinations:  No Impulsivity:  No Sexually Inappropriate Behavior:  No Financial Extravagance:  No Flight of Ideas:  No  Anxiety Symptoms: Excessive Worry:  Yes Panic Symptoms:  No Agoraphobia:  No Obsessive Compulsive: No  Symptoms: None Specific Phobias:  No Social Anxiety:  No  Psychotic Symptoms:  Hallucinations:  None Delusions:   No Paranoia:  No   Ideas of Reference:  No  PTSD Symptoms: Ever had a traumatic exposure:  No Had a traumatic exposure in the last month:  No Re-experiencing:  None Hypervigilance:  No Hyperarousal:  None Avoidance:  None  Traumatic Brain Injury:  None reported  Past Psychiatric History: Diagnosis: Bipolar affective disorder  Hospitalizations: [facility]  Outpatient Care:   Substance Abuse Care: [facility], [place], [facility] [place]  Self-Mutilation: Denies report  Suicidal Attempts: Denies attempts, admit thoughts  Violent Behaviors: Denies report.   Past Medical History:   Past Medical History  Diagnosis Date  . Bipolar 1 disorder   . Bipolar 1 disorder   . Dysrhythmia     Left sided heart diease  . Mental disorder   . Bipolar affective disorder, mixed, in partial or remission   . Headache   . Anxiety   . Renal disorder   . Kidney stones   . Depression    History of Loss of Consciousness:  Yes Seizure History:  No Cardiac History:  No Allergies:  No Known Allergies Current Medications:  Current Facility-Administered Medications  Medication Dose Route Frequency Provider Last Rate Last Dose  . acetaminophen (TYLENOL) tablet 650 mg  650 mg Oral Q6H PRN Franchot Gallo, MD   650 mg at 03/19/11 1610  . alum & mag hydroxide-simeth (MAALOX/MYLANTA) 200-200-20 MG/5ML suspension 30 mL  30 mL Oral Q4H PRN Randy Readling,  MD      . ciprofloxacin (CIPRO) tablet 750 mg  750 mg Oral BID Franchot Gallo, MD   750 mg at 03/19/11 0817  . cloNIDine (CATAPRES) tablet 0.1 mg  0.1 mg Oral QID Franchot Gallo, MD   0.1 mg at 03/19/11 1610   Followed by  . cloNIDine (CATAPRES) tablet 0.1 mg  0.1 mg Oral BH-qamhs Randy Readling, MD       Followed by  . cloNIDine (CATAPRES) tablet 0.1 mg  0.1 mg Oral QAC breakfast Franchot Gallo, MD      . dicyclomine (BENTYL) tablet 20 mg  20 mg Oral Q4H PRN Franchot Gallo, MD      . gabapentin (NEURONTIN) capsule 300  mg  300 mg Oral TID Orson Aloe      . hydrOXYzine (ATARAX/VISTARIL) tablet 25 mg  25 mg Oral Q6H PRN Franchot Gallo, MD   25 mg at 03/18/11 2212  . loperamide (IMODIUM) capsule 2-4 mg  2-4 mg Oral PRN Franchot Gallo, MD      . magnesium hydroxide (MILK OF MAGNESIA) suspension 30 mL  30 mL Oral Daily PRN Franchot Gallo, MD      . methocarbamol (ROBAXIN) tablet 500 mg  500 mg Oral Q8H PRN Franchot Gallo, MD   500 mg at 03/18/11 2212  . naproxen (NAPROSYN) tablet 500 mg  500 mg Oral BID PRN Franchot Gallo, MD   500 mg at 03/19/11 0313  . ondansetron (ZOFRAN-ODT) disintegrating tablet 4 mg  4 mg Oral Q6H PRN Franchot Gallo, MD   4 mg at 03/19/11 9604  . traZODone (DESYREL) tablet 150 mg  150 mg Oral QHS Franchot Gallo, MD   150 mg at 03/18/11 2212  . DISCONTD: gabapentin (NEURONTIN) capsule 300 mg  300 mg Oral QAC breakfast Franchot Gallo, MD   300 mg at 03/19/11 0631   Facility-Administered Medications Ordered in Other Encounters  Medication Dose Route Frequency Provider Last Rate Last Dose  . DISCONTD: acetaminophen (TYLENOL) tablet 650 mg  650 mg Oral Q4H PRN Thomasene Lot, PA      . DISCONTD: alum & mag hydroxide-simeth (MAALOX/MYLANTA) 200-200-20 MG/5ML suspension 30 mL  30 mL Oral PRN Thomasene Lot, PA      . DISCONTD: ibuprofen (ADVIL,MOTRIN) tablet 600 mg  600 mg Oral Q8H PRN Thomasene Lot, PA      . DISCONTD: LORazepam (ATIVAN) tablet 1 mg  1 mg Oral Q8H PRN Thomasene Lot, PA   1 mg at 03/18/11 1720  . DISCONTD: nicotine (NICODERM CQ - dosed in mg/24 hours) patch 21 mg  21 mg Transdermal Daily Thomasene Lot, PA      . DISCONTD: ondansetron (ZOFRAN) tablet 4 mg  4 mg Oral Q8H PRN Thomasene Lot, PA      . DISCONTD: zolpidem (AMBIEN) tablet 5 mg  5 mg Oral QHS PRN Thomasene Lot, PA        Previous Psychotropic Medications:  Medication Dose                        Substance Abuse History in the last 12 months: Substance Age of 1st Use Last Use Amount Specific Type   Nicotine "I don't smoke"     Alcohol      Cannabis "I don't smoke weed"     Opiates "I have been abusing prescription pills since 2000"     Cocaine Denies use     Methamphetamines Denies use     LSD Denies use  Ecstasy Denies use     Benzodiazepines Denies use     Caffeine      Inhalants      Others:                         Medical Consequences of Substance Abuse: Liver damage, Risks for overdose  Legal Consequences of Substance Abuse: Arrests/jail time  Family Consequences of Substance Abuse: Family discord.  Blackouts:  No DT's:  No Withdrawal Symptoms:  None  Social History: Current Place of Residence:   Place of Birth:   Family Members: Marital Status:  Married Children: 6    Relationships: Married  History of Abuse (Emotional/Phsycial/Sexual): None reported Occupational Experiences: Disabled Hotel manager History:  None reported Legal History: "I do not have custody of my 6 children" Hobbies/Interests:  Family History:  History reviewed. No pertinent family history.  Mental Status Examination/Evaluation: Objective:  Appearance: Fairly Groomed  Eye Contact::  Good  Speech:  Clear and Coherent  Volume:  Normal  Mood:  "My mood is really not good, I am very sad"  Affect:  Flat  Thought Process:  Logical  Orientation:  Full  Thought Content:  Hallucinations: None  Suicidal Thoughts:  Yes.  without intent/plan  Homicidal Thoughts:  No  Judgement:  Impaired  Insight:  Fair  Psychomotor Activity:  Increased  Akathisia:  No  Handed:  Right    Assets:  Desire for Improvement           Assessment:    AXIS I Substance Abuse, Bipolar affective disorder  AXIS II Deferred  AXIS III Past Medical History  Diagnosis Date  . Bipolar 1 disorder   . Bipolar 1 disorder   . Dysrhythmia     Left sided heart diease  . Mental disorder   . Bipolar affective disorder, mixed, in partial or remission   . Headache   . Anxiety   . Renal disorder   . Kidney  stones   . Depression      AXIS IV problems related to legal system/crime and substance abuse problems  AXIS V 41-50 serious symptoms   Treatment Plan/Recommendations:  Treatment Plan Summary: Daily contact with patient to assess and evaluate symptoms and progress in treatment Medication management  Observation Level/Precautions:  Q 15 minutes checks for safety  Laboratory:  Obtain vitamin D levels  Psychotherapy:  Group  Medications:  See lists  Routine PRN Medications:  Yes  Consultations:  None indicated  Discharge Concerns:  Safety  Other:      Armandina Stammer I 1/2/201310:45 AM

## 2011-03-19 NOTE — Progress Notes (Signed)
[facility] Group Notes: (Counselor/Nursing/MHT/Case Management/Adjunct)   Type of Therapy:  Group Therapy  Participation Level:  Active  Participation Quality:  Attentive, Appropriate, Sharing  Affect:  Blunted  Cognitive:  Appropriate  Insight:  Limited  Engagement in Group: Good  Engagement in Therapy:  Good  Modes of Intervention:  Support and Exploration  Summary of Progress/Problems: [name] was very engaged in group. She described her concept of depression as sad, overwhelming, and crawling into her little hole away from the rest of the world. Her thought processes around the depression were in regards to not having a reason to be depressed because her life is going well, and thinking badly about herself for not being able to be "okay." [name] talked about her substance abuse, saying that it not only contributes to her depression, but so do her parents who tell her that her children will resent her the way they resent their mothers who are alcoholics.   [name] 03/19/2011  3:13 PM
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
## 2011-03-19 NOTE — Progress Notes (Addendum)
Suicide Risk Assessment  Admission Assessment     Demographic factors:  Assessment Details Time of Assessment: Admission Information Obtained From: Patient Current Mental Status:  Current Mental Status: Suicidal ideation indicated by patient;Self-harm thoughts Loss Factors:    Historical Factors:  Historical Factors: Prior suicide attempts;Family history of mental illness or substance abuse Risk Reduction Factors:  Risk Reduction Factors: Responsible for children under 96 years of age;Sense of responsibility to family;Religious beliefs about death;Living with another person, especially a relative;Positive social support;Positive therapeutic relationship  CLINICAL FACTORS:   Severe Anxiety and/or Agitation Dysthymia Alcohol/Substance Abuse/Dependencies Chronic Pain Previous Psychiatric Diagnoses and Treatments Medical Diagnoses and Treatments/Surgeries  COGNITIVE FEATURES THAT CONTRIBUTE TO RISK:  Thought constriction (tunnel vision)    SUICIDE RISK:   Moderate:  Frequent suicidal ideation with limited intensity, and duration, some specificity in terms of plans, no associated intent, good self-control, limited dysphoria/symptomatology, some risk factors present, and identifiable protective factors, including available and accessible social support.  Reason for admission: Substance abuse and depression  Mental Status Examination and Plan: Patient denies suicidal or homicidal ideation, hallucinations, illusions, or delusions. Patient engages with good eye contact, is able to focus adequately in a one to one setting, and has clear goal directed thoughts. Patient speaks with a natural conversational volume, rate, and tone. Anxiety was reported at 8 on a scale of 1 the least and 10 the most. Depression was reported at 9 on the same scale. Patient is oriented times 4, recent and remote memory intact. Judgement: impaired by the opiates that she has been consuming for several  months Insight: limited Plan:  We will admit the patient for crisis stabilization and treatment. I talked to pt about starting higher doses of Neurontin for her anxiety and pain management.  Also restarted her Seroquel 800mg  q HS for sleep. I explained the risks and benefits of medication in detail.  We will continue on q. 15 checks the unit protocol. At this time there is no clinical indication for one-to-one observation as patient contract for safety and presents little risk to harm themself and others.  We will increase collateral information. I encourage patient to participate in group milieu therapy. Pt will be seen in treatment team meeting tomorrow morning for further treatment and appropriate discharge planning. Please see history and physical note for more detailed information ELOS: 3 to 5 days.   [name] 03/19/2011, 9:39 AM

## 2011-03-19 NOTE — Progress Notes (Signed)
Pt blunted, depressed and anxious.  Pt cooperative with staff and peers and observed interacting with peers in the dayroom.  Pt shared that she has chronic right foot pain.  Pt did share that she is having shakiness, tingling in her hands and feet and shared that she was having nausea when asked.  Pt denied SI/HI and remains safe on the unit.

## 2011-03-19 NOTE — Progress Notes (Signed)
Pt attended discharge planning group and actively participated.  Pt presents with flat affect and depressed mood.  Pt was open with sharing reason for entering the hospital.  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  Pt states that she had foot surgery last December and started abusing her pain patches.  Pt states that recently she was having to buy them off the Internet from [place], spending $1,300 a month on them.  Pt states that she also has a stressful home environment.  Pt states she lives with her husband and 5 kids.  Pt states that her 70 year old daughter ran away last year and this was upsetting because she had no idea if she was alive or well.  Pt states that she is now safe and home with her.  Pt states they recently moved from [place] 2 years ago.  Pt states she lost custody of her kids due to her substance use but now has them back.  Pt states she has a doctor in [facility] but would like a referral to a doctor locally.  Pt states she lives in [place].  Pt has transportation.  Pt states she gets disability for bipolar disorder.  Pt ranks depression at a 4 and anxiety at a 9 today.  Pt denies SI.  SW will assess for appropriate referrals.    Reyes Ivan, LCSWA 03/19/2011  10:46 AM

## 2011-03-19 NOTE — Progress Notes (Signed)
Patient ID: [name], female   DOB: 10/08/1965, 46 y.o.   MRN: [id number] Pt is awake and active on the unit this AM. Pt is attending groups and is cooperative with staff. Pt mood and affect are anxious and pt is experiencing some withdrawal symptoms such as nausea, HA pain and tingling in her arms. Writer administered appropriate medications. Writer determined that pt is also an increased fall risk. Pt refuses the use of any assistive devices. Pt c/o lack of sleep as well and states that she was unable to sleep last night. Writer will continue to monitor.

## 2011-03-20 DIAGNOSIS — F319 Bipolar disorder, unspecified: Secondary | ICD-10-CM

## 2011-03-20 MED ORDER — CLONIDINE HCL 0.1 MG PO TABS
0.0500 mg | ORAL_TABLET | Freq: Three times a day (TID) | ORAL | Status: AC
  Administered 2011-03-20 – 2011-03-21 (×2): 0.05 mg via ORAL
  Administered 2011-03-21 (×2): 0.1 mg via ORAL
  Administered 2011-03-22 (×2): 0.05 mg via ORAL
  Filled 2011-03-20: qty 0.5
  Filled 2011-03-20: qty 1
  Filled 2011-03-20 (×3): qty 0.5

## 2011-03-20 MED ORDER — TRAZODONE HCL 100 MG PO TABS
200.0000 mg | ORAL_TABLET | Freq: Every day | ORAL | Status: DC
  Administered 2011-03-20 – 2011-03-21 (×2): 200 mg via ORAL
  Filled 2011-03-20 (×4): qty 2

## 2011-03-20 MED ORDER — CLONIDINE HCL 0.1 MG/24HR TD PTWK
0.0500 mg | MEDICATED_PATCH | TRANSDERMAL | Status: DC
  Filled 2011-03-20: qty 1

## 2011-03-20 MED ORDER — CELECOXIB 200 MG PO CAPS
200.0000 mg | ORAL_CAPSULE | Freq: Two times a day (BID) | ORAL | Status: DC
  Administered 2011-03-20 – 2011-03-31 (×21): 200 mg via ORAL
  Filled 2011-03-20 (×4): qty 1
  Filled 2011-03-20: qty 2
  Filled 2011-03-20 (×22): qty 1

## 2011-03-20 MED ORDER — CLONIDINE HCL 0.1 MG/24HR TD PTWK: 0.0500 mg | MEDICATED_PATCH | TRANSDERMAL | Status: DC

## 2011-03-20 MED ORDER — CLONIDINE HCL 0.1 MG PO TABS
0.0500 mg | ORAL_TABLET | Freq: Two times a day (BID) | ORAL | Status: AC
  Administered 2011-03-22 – 2011-03-24 (×4): 0.05 mg via ORAL
  Filled 2011-03-20 (×4): qty 0.5

## 2011-03-20 MED ORDER — CLONIDINE HCL 0.1 MG PO TABS
0.0500 mg | ORAL_TABLET | Freq: Every day | ORAL | Status: AC
  Administered 2011-03-25 – 2011-03-26 (×2): 0.05 mg via ORAL
  Filled 2011-03-20 (×2): qty 0.5

## 2011-03-20 NOTE — Progress Notes (Addendum)
Patient ID: [name], female   DOB: [birth year], 46 y.o.   MRN: [id number] [facility] MD Progress Note  03/20/2011 9:57 AM   Suicidal Ideation:   Plan:  No  Intent:  No  Means:  No  Homicidal Ideation:   Plan:  No  Intent:  No  Means:  No  Mental Status: General Appearance /Behavior:  Casual Eye Contact:  Good Motor Behavior:  Normal Speech:  Normal Level of Consciousness:  Alert Mood:  2 on a scale of 1 is the least and 10 is the most Affect:  Constricted Anxiety Level:  8 on a scale of 1 is the least and 10 is the most Thought Process:  Coherent Thought Content:  WNL Perception:  Normal Judgment:  Good Insight:  Present Cognition:  Concentration Yes Sleep:  Number of Hours: 6.25  Pt states she was just laying there and awake for all the checks that they did throughout the night.  This may be the difference i her perception and that of the staff.  Vital Signs:Blood pressure 90/68, pulse 112, temperature 97.2 F (36.2 C), temperature source Oral, resp. rate 18, height 5' 4.96" (1.65 m), weight 82.101 kg (181 lb). Lab Results: Results for orders placed during the hospital encounter of 03/18/11 (from the past 48 hour(s))  URINE RAPID DRUG SCREEN (HOSP PERFORMED)     Status: Normal   Collection Time   03/18/11 12:11 PM      Component Value Range Comment   Opiates NONE DETECTED  NONE DETECTED     Cocaine NONE DETECTED  NONE DETECTED     Benzodiazepines NONE DETECTED  NONE DETECTED     Amphetamines NONE DETECTED  NONE DETECTED     Tetrahydrocannabinol NONE DETECTED  NONE DETECTED     Barbiturates NONE DETECTED  NONE DETECTED    ACETAMINOPHEN LEVEL     Status: Normal   Collection Time   03/18/11 12:20 PM      Component Value Range Comment   Acetaminophen (Tylenol), Serum 22.9  10 - 30 (ug/mL)   CBC     Status: Normal   Collection Time   03/18/11 12:20 PM      Component Value Range Comment   WBC 4.9  4.0 - 10.5 (K/uL)    RBC 4.43  3.87 - 5.11 (MIL/uL)    Hemoglobin 12.9   12.0 - 15.0 (g/dL)    HCT 40.9  81.1 - 91.4 (%)    MCV 83.1  78.0 - 100.0 (fL)    MCH 29.1  26.0 - 34.0 (pg)    MCHC 35.1  30.0 - 36.0 (g/dL)    RDW 78.2  95.6 - 21.3 (%)    Platelets 164  150 - 400 (K/uL)   DIFFERENTIAL     Status: Abnormal   Collection Time   03/18/11 12:20 PM      Component Value Range Comment   Neutrophils Relative 78 (*) 43 - 77 (%)    Neutro Abs 3.8  1.7 - 7.7 (K/uL)    Lymphocytes Relative 18  12 - 46 (%)    Lymphs Abs 0.9  0.7 - 4.0 (K/uL)    Monocytes Relative 4  3 - 12 (%)    Monocytes Absolute 0.2  0.1 - 1.0 (K/uL)    Eosinophils Relative 0  0 - 5 (%)    Eosinophils Absolute 0.0  0.0 - 0.7 (K/uL)    Basophils Relative 0  0 - 1 (%)    Basophils Absolute 0.0  0.0 - 0.1 (K/uL)   COMPREHENSIVE METABOLIC PANEL     Status: Abnormal   Collection Time   03/18/11 12:20 PM      Component Value Range Comment   Sodium 140  135 - 145 (mEq/L)    Potassium 3.8  3.5 - 5.1 (mEq/L)    Chloride 103  96 - 112 (mEq/L)    CO2 27  19 - 32 (mEq/L)    Glucose, Bld 137 (*) 70 - 99 (mg/dL)    BUN 10  6 - 23 (mg/dL)    Creatinine, Ser 0.45  0.50 - 1.10 (mg/dL)    Calcium 9.7  8.4 - 10.5 (mg/dL)    Total Protein 7.1  6.0 - 8.3 (g/dL)    Albumin 4.0  3.5 - 5.2 (g/dL)    AST 48 (*) 0 - 37 (U/L)    ALT 70 (*) 0 - 35 (U/L)    Alkaline Phosphatase 106  39 - 117 (U/L)    Total Bilirubin 0.2 (*) 0.3 - 1.2 (mg/dL)    GFR calc non Af Amer 77 (*) >90 (mL/min)    GFR calc Af Amer 89 (*) >90 (mL/min)   ETHANOL     Status: Normal   Collection Time   03/18/11 12:20 PM      Component Value Range Comment   Alcohol, Ethyl (B) <11  0 - 11 (mg/dL)   SALICYLATE LEVEL     Status: Abnormal   Collection Time   03/18/11 12:20 PM      Component Value Range Comment   Salicylate Lvl <2.0 (*) 2.8 - 20.0 (mg/dL)   URINALYSIS, ROUTINE W REFLEX MICROSCOPIC     Status: Abnormal   Collection Time   03/18/11 12:53 PM      Component Value Range Comment   Color, Urine AMBER (*) YELLOW  BIOCHEMICALS MAY BE  AFFECTED BY COLOR   APPearance TURBID (*) CLEAR     Specific Gravity, Urine 1.046 (*) 1.005 - 1.030     pH 5.0  5.0 - 8.0     Glucose, UA NEGATIVE  NEGATIVE (mg/dL)    Hgb urine dipstick NEGATIVE  NEGATIVE     Bilirubin Urine SMALL (*) NEGATIVE     Ketones, ur TRACE (*) NEGATIVE (mg/dL)    Protein, ur NEGATIVE  NEGATIVE (mg/dL)    Urobilinogen, UA 0.2  0.0 - 1.0 (mg/dL)    Nitrite NEGATIVE  NEGATIVE     Leukocytes, UA NEGATIVE  NEGATIVE    URINE MICROSCOPIC-ADD ON     Status: Abnormal   Collection Time   03/18/11 12:53 PM      Component Value Range Comment   Bacteria, UA RARE  RARE     Crystals CA OXALATE CRYSTALS (*) NEGATIVE     Urine-Other AMORPHOUS URATES/PHOSPHATES       Pt is still having withdrawal symptoms, but her BP is too low for a full dose of Clonidine.  Will shift to the patch so she can get some relief, but [place] a half a patch so her BP won't drop much further.  Will encourage pt to eat salty stuff so her BP will go up some.  She desires to increase the Trazodone.  Will do so and see if her perception of her sleep changes in face of the staff observations.   Pharmacy boss of our local pharmacist disallows the patch to be used without proof of doing so is safe.  Apparently the TTS is a reservoir type patch and cutting  can lead to higher doses than expected.  This has not been the case for the 2 decades that I have prescribed this.  So I will order the lower dose pills that the pharmacist says are available.  Dan Humphreys, Dick Hark

## 2011-03-20 NOTE — Progress Notes (Signed)
Patient ID: [name], female   DOB: 1965/05/31, 46 y.o.   MRN: [id number] Pt. Cooperative, preparing for Neomia Dear, denies SHI. Pt. Reports depression as "4" out of 10.  Staff will continue to monitor q44min for safety.

## 2011-03-20 NOTE — Progress Notes (Signed)
[facility] Group Notes: (Counselor/Nursing/MHT/Case Management/Adjunct) 03/20/2011   @1 :15pm  Type of Therapy:  Group Therapy  Participation Level:  Active  Participation Quality:  Attentive, Appropriate, Sharing  Affect:  Blunted  Cognitive:  Appropriate  Insight:  Limited  Engagement in Group: Good  Engagement in Therapy:  Good  Modes of Intervention:  Support and Exploration  Summary of Progress/Problems: [name] processed her experience as a part of her family, stating that she has chosen a different belief system than her parents and this bothers them. She emphasized that it undermines her choices when they relate everything back to them, asking "What did we do wrong as parents?" when she does not believe that her choices are negative and that they reflect on her parents. [name] went on to say that she believes she has broken free from a negative and dysfunctional family system, but that she still has her parents trying to pull her back in. She seemed to have trouble focusing on her choices that have become troublesome to her, and instead focused on what her parents do not like about her.  [name] 03/20/2011  3:56 PM

## 2011-03-20 NOTE — Progress Notes (Signed)
Pt attended discharge planning group and actively participated.  Pt presents with anxious mood and affect.  Pt ranks depression at a 9 and anxiety at an 8 today.  Pt denies SI.  Pt states she feels worse than she did yesterday.  Pt reports being put on a clonidine detox protocol.  Pt states her family has been supportive while she is here.  Pt still open to referral to services in [place].  SW will make appropriate referral.  No further needs at this time.   [name], LCSWA 03/20/2011  9:10 AM    Per State Regulation 482.30 This Chart was reviewed for medical necessity with respect to the patient's Admission/Duration of stay.   Carmina Miller  03/20/2011  Next Review Date: 03/24/11

## 2011-03-20 NOTE — Progress Notes (Addendum)
[facility] Group Notes: (Counselor/Nursing/MHT/Case Management/Adjunct) 03/20/11 @ 11:00am  Type of Therapy:  Group Therapy  Participation Level:  Active  Participation Quality:  Attentive, Appropriate, and Sharing  Affect:  Appropriate  Cognitive:  Appropriate  Insight:  Limited  Engagement in Group: Good  Engagement in Therapy:  Good  Modes of Intervention:  Support and Exploration  Summary of Progress/Problems: [name] appeared not to feel well and was a bit tremulous in group. She appeared to be attentive and seemed to relate well to the statements others made about belief systems. [name] did state that her experience is a bit different that the others in group: while they see themselves as "less than" others without mental health problems, she does not seem to believe that she is any different than others, and spoke briefly about how her parents view her that way, but she tries not to adopt it for her own belief. [name] reported that rather than seeing herself as the reason bad things happen in her life, she sees herself and not having a reason for being depressed because everything around her is going well. Her belief system seems to be that she should be able to snap out of her depression and addictions because she is a good person. [name] also stated that her parents think if she had more faith in God she would not be experiencing these things, but she rejects that belief as well and thinks she does need medications to be well.   [name] 03/20/2011  3:11 PM

## 2011-03-20 NOTE — Progress Notes (Signed)
Patient resting quietly with eyes closed. Respirations even and unlabored. No distress noted.

## 2011-03-20 NOTE — Tx Team (Signed)
Interdisciplinary Treatment Plan Update (Adult)  Date:  03/20/2011  Time Reviewed:  10:24 AM   Progress in Treatment: Attending groups: Yes Participating in groups:  Yes Taking medication as prescribed: Yes Tolerating medication:  Yes Family/Significant othe contact made:  Contact being attempted with husband Patient understands diagnosis:  Yes Discussing patient identified problems/goals with staff:  Yes Medical problems stabilized or resolved:  Yes Denies suicidal/homicidal ideation: Yes Issues/concerns per patient self-inventory:  None identified Other: N/A  New problem(s) identified: None Identified  Reason for Continuation of Hospitalization: Anxiety Depression Medication stabilization Withdrawal symptoms  Interventions implemented related to continuation of hospitalization: mood stabilization, medication monitoring and adjustment, group therapy and psycho education, safety checks q 15 mins  Additional comments: N/A  Estimated length of stay: 3-5 days  Discharge Plan: SW will assess for appropriate referrals in [facility].    New goal(s): N/A  Review of initial/current patient goals per problem list:    1.  Goal(s): Reduce depressive and anxiety symptoms  Met:  No  Target date: by discharge  As evidenced by: Reducing depression from a 10 to a 3 as reported by pt.   2.  Goal (s): Reduce/Eliminate suicidal ideation  Met:  No  Target date: by discharge  As evidenced by: pt reporting no SI.    3.  Goal(s): Address substance use  Met:  No  Target date: by discharge  As evidenced by: Complete detox protocol and refer to treatment.    Attendees: Patient:     Family:     Physician:  Orson Aloe, MD  03/20/2011  10:24 AM   Nursing:   Izola Price, RN 03/20/2011 10:26 AM   Case Manager:  Reyes Ivan, LCSWA 03/20/2011  10:24 AM   Counselor:  Angus Palms, LCSW 03/20/2011  10:24 AM   Other:  Juline Patch, LCSW 03/20/2011  10:24 AM   Other:     Other:      Other:      Scribe for Treatment Team:   Carmina Miller, 03/20/2011 , 10:24 AM

## 2011-03-20 NOTE — Progress Notes (Signed)
Patient ID: [name], female   DOB: [birth year], 46 y.o.   MRN: [id number] Pt is asleep in bed this AM. Pt is cooperative with staff and is attending groups. Pt denies SI/HI and AVH. Pt is hypotensive this AM and Clonidine held. Pt c/o withdrawal symptoms and writer administered appropriate medications. Pt mood and affect are depressed and pt forwards little but she does participate in the milieu. Writer will continue to monitor.

## 2011-03-21 DIAGNOSIS — F319 Bipolar disorder, unspecified: Secondary | ICD-10-CM

## 2011-03-21 MED ORDER — MIRTAZAPINE 15 MG PO TABS
7.5000 mg | ORAL_TABLET | Freq: Every day | ORAL | Status: DC
  Administered 2011-03-21: 7.5 mg via ORAL
  Filled 2011-03-21: qty 0.5
  Filled 2011-03-21: qty 1
  Filled 2011-03-21: qty 0.5

## 2011-03-21 NOTE — Progress Notes (Signed)
[facility] Group Notes:  (Counselor/Nursing/MHT/Case Management/Adjunct)  03/21/2011 3:51 PM  Type of Therapy:  Psychoeducational Skills  Participation Level:  Active  Participation Quality:  Attentive  Affect:  Appropriate  Cognitive:  Appropriate  Insight:  Good  Engagement in Group:  Good  Engagement in Therapy:  Good  Modes of Intervention:  Education, Problem-solving and Support  Summary of Progress/Problems:Pt participated in psychoeducation on DBT wisemind and also received info. [facility]. Pt shared she is more emotional when it comes to her children and her SI are based on emotions but pt says she knows how to be rational and knows she needs to use that more, ex include pro and cons list. [name], LPCA     Purcell Nails 03/21/2011, 3:51 PM

## 2011-03-21 NOTE — Progress Notes (Signed)
Pt attended discharge planning group and actively participated.  Pt presents with flat affect and depressed mood.  Pt ranks depression at a 6 and anxiety at an 8 today.  Pt denies SI today.  Pt states she feels the clonidine detox protocol is helping her.  Pt states her mom spoke with her about going to a halfway house called [facility] in [place].  Pt states she went there before and did well there and was clean and sober.  Pt states her husband also wants her to go to long term treatment and suggested [facility].  Pt states she is considering these options and would be open to exploring them more.  SW will assess for appropriate referrals.  Reyes Ivan, LCSWA 03/21/2011  10:41 AM

## 2011-03-21 NOTE — Progress Notes (Signed)
Patient ID: [name], female   DOB: Dec 29, 1965, 46 y.o.   MRN: [id number] Nursing: Pt. able to ambulate about the hallway and in and out of the DR tonight, without c/o RLE pain: took HS meds and went to bed. Denies lethality and A/V/H's.   23:00 Pt. Now c/o unremitting RLE pain and was given Robaxin to alleviate and allow her to get to sleep with HS med already given her earlier. 00:00  Pt. Asleep.

## 2011-03-21 NOTE — Progress Notes (Signed)
Patient ID: [name], female   DOB: 1965/06/25, 46 y.o.   MRN: [id number] Pt reports poor sleep and poor apetite.  Pt reports depression at 4 and hopelessness at 5.  Pt's energy level is low and ability to pay attention is poor. Pt denies suicidal ideation.

## 2011-03-21 NOTE — Progress Notes (Signed)
[facility] Adult Inpatient Family/Significant Other Suicide Prevention Education  Suicide Prevention Education:  Education Completed; husband Renae Fickle at [phone] has been identified by the patient as the family member/significant other with whom the patient will be residing, and identified as the person(s) who will aid the patient in the event of a mental health crisis (suicidal ideations/suicide attempt).  With written consent from the patient, the family member/significant other has been provided the following suicide prevention education, prior to the and/or following the discharge of the patient.  The suicide prevention education provided includes the following:  Suicide risk factors  Suicide prevention and interventions  National Suicide Hotline telephone number  [facility] assessment telephone number  [facility] Emergency Assistance 911  [facility] and/or Residential Mobile Crisis Unit telephone number  Request made of family/significant other to:  Remove weapons (e.g., guns, rifles, knives), all items previously/currently identified as safety concern.    Remove drugs/medications (over-the-counter, prescriptions, illicit drugs), all items previously/currently identified as a safety concern.  The family member/significant other verbalizes understanding of the suicide prevention education information provided.  The family member/significant other agrees to remove the items of safety concern listed above. Chene's husband states that they have no firearms in the home. [name], LPCA   Britanee Vanblarcom [name] 03/21/2011, 4:30 PM

## 2011-03-21 NOTE — Progress Notes (Signed)
[facility] MD Progress Note  03/21/2011 4:21 PM  Patient reports during round, "I miss my children.  And I am not in a good mood as a result. I am not sleeping well at night. That contributes to the moodiness. But my husband will be bringing in some of my children here to visit. I am looking forward to seeing them. Dr. Dan Humphreys has informed me that he may add Remeron to help me sleep at night, but I don't think that he has done it"  Diagnosis:  Bipolar 1 disorder                      Major depressive disorder, ADL's:  Intact  Sleep:  Poor  Appetite:  Fair  Suicidal Ideation:   Plan:  No  Intent:  No  Means:  No  Homicidal Ideation:   Plan:  No  Intent:  No  Means:  No  AEB (as evidenced by):  Mental Status: General Appearance [name]:  Neat Eye Contact:  Good Motor Behavior:  Normal Speech:  Normal Level of Consciousness:  Alert Mood:  Depressed Affect:  Blunt Anxiety Level:  Minimal Thought Process:  Intact Thought Content:  WNL Perception:  Normal Judgment:  Good Insight:  Present Cognition:  Orientation time, place and person Sleep:  Number of Hours: 5.75   Vital Signs:Blood pressure 112/78, pulse 86, temperature 97.6 F (36.4 C), temperature source Oral, resp. rate 16, height 5' 4.96" (1.65 m), weight 181 lb (82.101 kg).  Lab Results: No results found for this or any previous visit (from the past 48 hour(s)).    Treatment Plan Summary: Daily contact with patient to assess and evaluate symptoms and progress in treatment Medication management  Plan: Remeron 7.5 mg Q hs.           Continue current treatment plan.  Armandina Stammer I 03/21/2011, 4:21 PM

## 2011-03-21 NOTE — Progress Notes (Signed)
Recreation Therapy Notes  03/21/2011         Time: 1415      Group Topic/Focus: The focus of this group is on discussing various styles of communication and communicating assertively using 'I' (feeling) statements.  Participation Level: Active  Participation Quality: Appropriate  Affect: depressed  Cognitive: Oriented   Additional Comments: patient reports she spoke with her parents today, who informed her that mother's uterine cancer is back and she will be restarting chemotherapy.    [name] 03/21/2011 3:41 PM

## 2011-03-21 NOTE — Progress Notes (Signed)
[facility] Group Notes:  (Counselor/Nursing/MHT/Case Management/Adjunct)  03/21/2011 3:04 PM  Type of Therapy:  group therapy  Participation Level:  Active  Participation Quality:  Appropriate, Attentive and Sharing  Affect:  Appropriate  Cognitive:  Appropriate  Insight:  Good  Engagement in Group:  Good  Engagement in Therapy:  Good  Modes of Intervention:  Problem-solving, Support and exploration  Summary of Progress/Problems: Pt participated in group therapy and was able to explore feelings around relapse and recovery. Pt was able to share that relpase for her does not start with a feeling but a negative thought that nags her. Pt was able to share that her road map to recovery includes feeling wanted by others and that not isolating is key. Pt shared she has some good friends. Pt also identified with others stating that the holidays are a trigger for her depression- feelings were explored.    Purcell Nails 03/21/2011, 3:04 PM

## 2011-03-22 MED ORDER — ZOLPIDEM TARTRATE 10 MG PO TABS
10.0000 mg | ORAL_TABLET | Freq: Every evening | ORAL | Status: DC | PRN
  Administered 2011-03-22 – 2011-03-24 (×3): 10 mg via ORAL
  Filled 2011-03-22 (×4): qty 1

## 2011-03-22 NOTE — Progress Notes (Signed)
[facility] MD Progress Note  03/22/2011 2:38 PM  "I have not slept in 4 days. It is getting to me. The Remeron 7.5 mg did not help. I can't sleep without Ambien. I take Ambien 10 mg at home. I am getting anxious from just not sleeping"  Diagnosis:   Axis I: Bipolar 1 disorder Axis II: Deferred Axis III:  Past Medical History  Diagnosis Date  . Bipolar 1 disorder   . Bipolar 1 disorder   . Dysrhythmia     Left sided heart diease  . Mental disorder   . Bipolar affective disorder, mixed, in partial or remission   . Headache   . Anxiety   . Renal disorder   . Kidney stones   . Depression    Axis IV: No changes Axis V: 51-60 moderate symptoms  ADL's:  Intact  Sleep:  No "I have not slept in 4 days"  Appetite:  Yes,  AEB: "I eat fairly well"  Suicidal Ideation:   Plan:  No  Intent:  No  Means:  No  Homicidal Ideation:   Plan:  No  Intent:  No  Means:  No    Mental Status: General Appearance /Behavior:  Casual Eye Contact:  Good Motor Behavior:  Normal Speech:  Normal Level of Consciousness:  Alert Mood:  Anxious Affect:  Tearful when talking about her insomnia Anxiety Level:  Moderate Thought Process:  Coherent Thought Content:  WNL Perception:  Normal Judgment:  Good Insight:  Present Cognition:  Orientation time, place and person Sleep:  Number of Hours: 5.75   Vital Signs:Blood pressure 116/81, pulse 86, temperature 97.6 F (36.4 C), temperature source Oral, resp. rate 16, height 5' 4.96" (1.65 m), weight 181 lb (82.101 kg).  Lab Results: No results found for this or any previous visit (from the past 48 hour(s)).  Treatment Plan Summary: Daily contact with patient to assess and evaluate symptoms and progress in treatment Medication management  Plan: Discontinue Remeron 7.5 mg.           Start Ambien 10 mg Q hs.           Continue current treatment plan.  Armandina Stammer I 03/22/2011, 2:38 PM

## 2011-03-22 NOTE — Progress Notes (Signed)
[facility] Group Notes:  (Counselor/Nursing/MHT/Case Management/Adjunct)  03/22/2011 3:20 PM  Type of Therapy:  Counseling Group  Participation Level:  Active  Participation Quality:  Appropriate  Affect:  Appropriate  Cognitive:  Alert and Appropriate  Insight:  Good  Engagement in Group:  Good  Engagement in Therapy:  Good  Modes of Intervention:  Clarification and Support  Summary of Progress/Problems:Pt. participated in group discussion on self sabotaging behaviors and enabling. Pt. Identified their self sabotaging behaviors or beliefs and discussed how they would positively enable themselves in order to make the changes they need in their life. Pt. 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 Pt. spoke about having 6 children    Neila Gear 03/22/2011, 3:20 PM

## 2011-03-22 NOTE — ED Provider Notes (Signed)
Medical screening examination/treatment/procedure(s) were performed by non-physician practitioner and as supervising physician I was immediately available for consultation/collaboration.  Doug Sou, MD 03/22/11 [phone]

## 2011-03-22 NOTE — Progress Notes (Signed)
[facility] Group Notes:  (Counselor/Nursing/MHT/Case Management/Adjunct)  03/22/2011 10:48 AM  Type of Therapy:  After care Planning group Pt. attended after care planning group and was given [place] Suicide Prevention Information with crisis and hotline numbers to use. The patients agreed to use them if needed. Pt. Spoke about coming to hospital for depression.  Lamar Blinks [place] 03/22/2011, 10:48 AM

## 2011-03-22 NOTE — Progress Notes (Signed)
Patient ID: [name], female   DOB: Jul 25, 1965, 46 y.o.   MRN: [id number] 03/22/2011  Nursing  D Pt is seen OOB UAL on the unit this AM... Interacting appropriately with peers and the staff. She takes her medications compliantly.She completed her self invnetory sheet and returned and on it

## 2011-03-22 NOTE — Progress Notes (Signed)
Patient ID: [name], female   DOB: May 18, 1965, 46 y.o.   MRN: [id number] Nursing: Pt. denies lethality and A/V/H's: admits to pain in Rt. Foot and ankle and was given Naproxen. Also c/o anxiety and was given Vistaril for the anxiety.  Also was given zolpidem to help her sleep. Pt. Took other HS meds without a problem and went to bed.Marland Kitchen

## 2011-03-23 MED ORDER — LITHIUM CARBONATE ER 300 MG PO TBCR
300.0000 mg | EXTENDED_RELEASE_TABLET | Freq: Two times a day (BID) | ORAL | Status: DC
  Administered 2011-03-23 – 2011-03-31 (×16): 300 mg via ORAL
  Filled 2011-03-23 (×20): qty 1

## 2011-03-23 MED ORDER — QUETIAPINE FUMARATE 400 MG PO TABS
800.0000 mg | ORAL_TABLET | Freq: Every day | ORAL | Status: DC
  Administered 2011-03-23 – 2011-03-30 (×8): 800 mg via ORAL
  Filled 2011-03-23 (×8): qty 2
  Filled 2011-03-23: qty 1
  Filled 2011-03-23 (×2): qty 2

## 2011-03-23 NOTE — Progress Notes (Signed)
[facility] MD Progress Note  03/23/2011 2:41 PM  Patient reports during rounds, "I did not sleep even after taking Ambien last night. This is the 5th night that I have not had any sleep. My mood is very bad. I am afraid because I am not right. I am not suicidal here, but I don't know what I might do if I am home feeling like this"  Diagnosis:   Axis I: Bipolar 1 disorder. Axis II: Deferred Axis III:  Past Medical History  Diagnosis Date  . Bipolar 1 disorder   . Bipolar 1 disorder   . Dysrhythmia     Left sided heart diease  . Mental disorder   . Bipolar affective disorder, mixed, in partial or remission   . Headache   . Anxiety   . Renal disorder   . Kidney stones   . Depression    Axis IV: No changes Axis V: 41-50 serious symptoms  ADL's:  Intact  Sleep:  No "I did not sleep any last night"  Appetite:  Yes,  AEB: "I eat"  Suicidal Ideation: No  Plan:  No  Intent:  No  Means:  No  Homicidal Ideation: No  Plan:  No  Intent:  No  Means:  No    Mental Status: General Appearance /Behavior:  Casual Eye Contact:  Good Motor Behavior:  Restlestness Speech:  Pressured Level of Consciousness:  Alert Mood:  Anxious and Irritable, "I am not right" Affect:  Blunt Anxiety Level:  Moderate Thought Process:  Coherent Thought Content:  Manic like Perception:  Normal Judgment:  Fair Insight:  Present Cognition:  Orientation time, place and person Sleep:  Number of Hours: 6.25   Vital Signs:Blood pressure 126/83, pulse 92, temperature 97.1 F (36.2 C), temperature source Oral, resp. rate 16, height 5' 4.96" (1.65 m), weight 181 lb (82.101 kg).  Lab Results: No results found for this or any previous visit (from the past 48 hour(s)).  Physical Findings: AIMS:  , ,  ,  ,    CIWA:  CIWA-Ar Total: 0  COWS:  COWS Total Score: 4   Treatment Plan Summary: Daily contact with patient to assess and evaluate symptoms and progress in treatment Medication management  Plan:  Discontinue Neurontin,           Initiated Lithium Carbonate 300 mg bid.           Continue current treatment plan.  Armandina Stammer I 03/23/2011, 2:41 PM

## 2011-03-23 NOTE — Progress Notes (Signed)
[facility] Group Notes:  (Counselor/Nursing/MHT/Case Management/Adjunct)  03/23/2011 4:19 PM  Type of Therapy:  Counseling Group Therapy  Participation Level:  Active  Participation Quality:  Appropriate  Affect:  Appropriate  Cognitive:  Appropriate  Insight:  Good  Engagement in Group:  Good  Engagement in Therapy:  Good  Modes of Intervention:  Clarification and Support  Summary of Progress/Problems: Pt.  participated in group session on supports and how to find supports if they have none. The pt shared who their supports wee in their lives and identified unhealthy and healthy supports. Pt.'s were encouraged to have more than one support if their support persons were not able to be there for them.Pt. Spoke about her friend being a support for her and being there when she needs her.  Lamar Blinks [place] 03/23/2011, 4:19 PM

## 2011-03-23 NOTE — Progress Notes (Signed)
Patient ID: [name], female   DOB: 10/31/1965, 46 y.o.   MRN: [id number] 03/23/2011 [address] [name] remains quite sad, depressed and low. She makes poor eye contact. SHe speaks and alks slowly....like she is in slow motion. SHe is compliant with her meds, though and attends her Life SKills group, participating tin the discussion, sharing her personal feelings and life experiences with the group. A She reported she had a toothache this afternoon, requested something for pain and wanted tylenol, naproxen and vistaril ( for her nerves). After she was medicated, she stated she felt " better" and was able to attend her group. R Safety is maintained and POC includes fostering therapeutic relationship already established. PD RN [facility]

## 2011-03-24 MED ORDER — ONDANSETRON 4 MG PO TBDP
4.0000 mg | ORAL_TABLET | Freq: Three times a day (TID) | ORAL | Status: DC | PRN
  Administered 2011-03-24 – 2011-03-25 (×2): 4 mg via ORAL

## 2011-03-24 MED ORDER — HYDROXYZINE HCL 25 MG PO TABS
25.0000 mg | ORAL_TABLET | ORAL | Status: DC | PRN
  Administered 2011-03-24 – 2011-03-31 (×7): 25 mg via ORAL

## 2011-03-24 MED ORDER — ONDANSETRON 4 MG PO TBDP: 4.0000 mg | ORAL_TABLET | Freq: Once | ORAL | Status: DC

## 2011-03-24 MED ORDER — QUETIAPINE FUMARATE ER 50 MG PO TB24
50.0000 mg | ORAL_TABLET | Freq: Two times a day (BID) | ORAL | Status: DC
  Administered 2011-03-24 – 2011-03-26 (×4): 50 mg via ORAL
  Filled 2011-03-24 (×7): qty 1

## 2011-03-24 NOTE — Progress Notes (Signed)
[facility] Group Notes:  (Counselor/Nursing/MHT/Case Management/Adjunct)  03/24/2011 3:19 PM  Type of Therapy:  Psychoeducational Skills  Participation Level:  Minimal  Participation Quality:  Attentive  Affect:  Flat  Cognitive:  Oriented  Insight:  Limited  Engagement in Group:  Limited  Engagement in Therapy:  Limited  Modes of Intervention:  Education and Problem-solving  Summary of Progress/Problems: [name] was present in afternoon educational group on healthy communicating, pt was quiet but attentive. Pt shared her experience of drawing a sand in the line to show boundaries and that was a way for her to communicate the need for respect from others. Pt shared in the future when she leaves here she will communicate to others self-love and worth by taking time for herself. Pt also spoke when spoken to or asked a question- pt often looking off into distant. [name], LPCA    [name] [name] 03/24/2011, 3:19 PM

## 2011-03-24 NOTE — Progress Notes (Signed)
[facility] MD Progress Note  03/24/2011 3:12 PM  "My mood is bad. I did not sleep last night. This is the 6th day that I had not slept. I took the lithium and the Ambien, I still could not sleep. I am anxious. I feel like I am going into the manic mood"  Diagnosis:   Axis I: Bipolar 1 Axis II: Deferred Axis III:  Past Medical History  Diagnosis Date  . Bipolar 1 disorder   . Bipolar 1 disorder   . Dysrhythmia     Left sided heart diease  . Mental disorder   . Bipolar affective disorder, mixed, in partial or remission   . Headache   . Anxiety   . Renal disorder   . Kidney stones   . Depression    Axis IV: No changes Axis V: 41-50 serious symptoms  ADL's:  Intact  Sleep:  6.25 per documentation.  Appetite:  Fair  Suicidal Ideation:   Plan:  No  Intent:  No  Means:  No  Homicidal Ideation:   Plan:  No  Intent:  No  Means:  No    Mental Status: General Appearance /Behavior:  Casual Eye Contact:  Good Motor Behavior:  Restlestness Speech:  Pressured Level of Consciousness:  Alert Mood:  Anxious Affect:  Blunt Anxiety Level:  Moderate Thought Process: rational Thought Content:  WNL Perception:  Normal Judgment:  Fair Insight:  Present Cognition:  Orientation time, place and person Sleep:  Number of Hours: 6.25   Vital Signs:Blood pressure 126/83, pulse 92, temperature 97.1 F (36.2 C), temperature source Oral, resp. rate 16, height 5' 4.96" (1.65 m), weight 82.101 kg (181 lb).  Lab Results: No results found for this or any previous visit (from the past 48 hour(s)).  Physical Findings: AIMS:  , ,  ,  ,    CIWA:  CIWA-Ar Total: 7  COWS:  COWS Total Score: 4   Treatment Plan Summary: Daily contact with patient to assess and evaluate symptoms and progress in treatment Medication management. Initiated Lithium carbonate 300 mg bid 03-23-11.  Plan: Add Seroquel XR 50 mg bid.           Continue Lithium Carbonate/ current treatment plan.           Monitor  for any changes in mood/behavior.    Armandina Stammer I 03/24/2011, 3:12 PM

## 2011-03-24 NOTE — Progress Notes (Signed)
Patient ID: [name], female   DOB: 05/08/1965, 46 y.o.   MRN: [id number] Nursing: Pt. denies lethality and A/V/H's, but remains pained, frustrated with her health and the health care system she has received services from, including some past providers and speaks bitterly of her treatment and helplessness to change her situation.  Pt. also blames herself for ignoring her nearly-fractured ankle and returning to work so soon, but states her physician at that time encouraged her to go back to work.  Pt. has limited insight into her present situation, but has gained more insight into the problems leading to her present condition and is preoccupied and saddened by it. Pt. encouraged to seek further advice from her orthopedic physician once she is discharged from [facility] and agrees with this idea. Pt took her HS meds and went to bed.

## 2011-03-24 NOTE — Tx Team (Signed)
Interdisciplinary Treatment Plan Update (Adult)  Date:  03/24/2011  Time Reviewed:  10:29 AM   Progress in Treatment: Attending groups: Yes Participating in groups:  Yes Taking medication as prescribed: Yes Tolerating medication:  Yes Family/Significant othe contact made: Yes  Patient understands diagnosis:  Yes Discussing patient identified problems/goals with staff:  Yes Medical problems stabilized or resolved:  Yes Denies suicidal/homicidal ideation: Yes Issues/concerns per patient self-inventory:  None identified Other: N/A  New problem(s) identified: None Identified  Reason for Continuation of Hospitalization: Anxiety Depression Medication stabilization  Interventions implemented related to continuation of hospitalization: mood stabilization, medication monitoring and adjustment, group therapy and psycho education, safety checks q 15 mins  Additional comments: N/A  Estimated length of stay: 3-5 days  Discharge Plan: Pt will be referred to long term treatment.  New goal(s): N/A  Review of initial/current patient goals per problem list:    1.  Goal(s): Reduce depressive symptoms  Met:  No  Target date: by discharge  As evidenced by: Reducing depression from a 10 to a 3 as reported by pt. Pt ranks at a 8 today.   2.  Goal (s): Reduce/Eliminate suicidal ideation  Met:  No  Target date: by discharge  As evidenced by: pt reporting no SI.    3.  Goal(s): Reduce anxiety  Met:  No  Target date: by discharge  As evidenced by: Reduce anxiety from a 10 to a 3 as reported by pt. Pt ranks at a 9 today.    Attendees: Patient:     Family:     Physician:  Orson Aloe, MD  03/24/2011  10:29 AM   Nursing:   Neill Loft, RN 03/24/2011 10:31 AM   Case Manager:  Reyes Ivan, LCSWA 03/24/2011  10:29 AM   Counselor:  Angus Palms, LCSW 03/24/2011  10:29 AM   Other:  Juline Patch, LCSW 03/24/2011  10:29 AM   Other:  Armandina Stammer, NP 03/24/2011 10:31 AM   Other:       Other:      Scribe for Treatment Team:   Carmina Miller, 03/24/2011 , 10:29 AM

## 2011-03-24 NOTE — Progress Notes (Signed)
Pt attended discharge planning group and actively participated.  Pt presents with calm mood and affect.  Pt states she is not feeling well and actually feels she is in a "full blown mania". Pt states she hasn't slept in 6 days now.  Pt ranks depression at an 8 and anxiety at a 9 today.  Pt denies SI.  Pt states she is now open to going to long term treatment.  Pt also asked SW for a referral to a saboxone treatment doctor in the area.  Pt went on to name doctors in the area she would not go back to and said she wants an "American female psychiatrist".  SW will refer pt to long term treatment and explore potential treatment options.    [name], LCSWA 03/24/2011  9:41 AM    Per State Regulation 482.30 This Chart was reviewed for medical necessity with respect to the patient's Admission/Duration of stay.   [name]  03/24/2011  Next Review Date:  03/26/11

## 2011-03-24 NOTE — Progress Notes (Signed)
[facility] Group Notes:  (Counselor/Nursing/MHT/Case Management/Adjunct)  03/24/2011 2:22 PM  Type of Therapy:  Group Therapy  Participation Level:  Active  Participation Quality:  Attentive  Affect:  Anxious  Cognitive:  Oriented  Insight:  Good  Engagement in Group:  Good  Engagement in Therapy:  Good  Modes of Intervention:  Problem-solving, Support and exploration  Summary of Progress/Problems: [name] was active in group therapy discussion on overcoming obstacles, pt identified her major obstacle as not getting enough sleep- pt shared she has been proactive by speaking with the doctor to get meds. Pt was able to provide positve feedback when the topic of boundaries with others came up- sharing she had to create boundaries with her mom and now their relationship is much better. [name], LPCA    Shanina Kepple [name] 03/24/2011, 2:22 PM

## 2011-03-25 MED ORDER — PROMETHAZINE HCL 25 MG/ML IJ SOLN
12.5000 mg | INTRAMUSCULAR | Status: AC
  Administered 2011-03-25: 12.5 mg via INTRAMUSCULAR

## 2011-03-25 MED ORDER — TRAZODONE HCL 100 MG PO TABS
100.0000 mg | ORAL_TABLET | Freq: Every evening | ORAL | Status: DC | PRN
  Administered 2011-03-25: 100 mg via ORAL
  Filled 2011-03-25: qty 1

## 2011-03-25 MED ORDER — TRAZODONE HCL 100 MG PO TABS: 100.0000 mg | ORAL_TABLET | Freq: Every evening | ORAL | Status: DC | PRN

## 2011-03-25 MED ORDER — PROMETHAZINE HCL 25 MG/ML IJ SOLN: 12.5000 mg | INTRAMUSCULAR | Status: DC

## 2011-03-25 MED ORDER — ONDANSETRON HCL 4 MG PO TABS
4.0000 mg | ORAL_TABLET | ORAL | Status: DC | PRN
  Administered 2011-03-26: 4 mg via ORAL
  Filled 2011-03-25: qty 1

## 2011-03-25 MED ORDER — LOPERAMIDE HCL 2 MG PO CAPS: 4.0000 mg | ORAL_CAPSULE | ORAL | Status: DC | PRN

## 2011-03-25 MED ORDER — TRAZODONE HCL 100 MG PO TABS
200.0000 mg | ORAL_TABLET | Freq: Every day | ORAL | Status: DC
  Administered 2011-03-25: 200 mg via ORAL
  Filled 2011-03-25 (×2): qty 2

## 2011-03-25 MED ORDER — PROMETHAZINE HCL 25 MG/ML IJ SOLN
INTRAMUSCULAR | Status: AC
  Filled 2011-03-25: qty 1

## 2011-03-25 NOTE — Progress Notes (Signed)
[facility] MD Progress Note  03/25/2011 8:43 AM  ADL's:  Intact  Sleep:  No  Appetite:  No  Suicidal Ideation:   Plan:  No  Intent:  No  Means:  No  Homicidal Ideation:   Plan:  No  Intent:  No  Means:  No  AEB (as evidenced by):  Mental Status: General Appearance [name]:  Casual Eye Contact:  Good Motor Behavior:  Normal Speech:  Normal Level of Consciousness:  Alert Mood:  8/10 (1 best 10 the worst) Affect:  Constricted Anxiety Level:  8/10 (1 best 10 the worst) Thought Process:  Coherent Thought Content:  WNL Perception:  Normal Judgment:  Fair Insight:  Imapired Cognition:  Orientation time, place and person Concentration Yes Sleep:  Number of Hours: 4.5   Vital Signs:Blood pressure 127/85, pulse 101, temperature 98.6 F (37 C), temperature source Oral, resp. rate 18, height 5' 4.96" (1.65 m), weight 82.101 kg (181 lb).  Lab Results: No results found for this or any previous visit (from the past 48 hour(s)).  Physical Findings: AIMS:  , ,  ,  ,    CIWA:  CIWA-Ar Total: 8  COWS:  COWS Total Score: 4   Comments: Pt describes that for the past 3 months she has been working her way up to using 100mg  Fentenal patches cut opened and emptied into her rectum twice a day.  Treatment Plan Summary: Daily contact with patient to assess and evaluate symptoms and progress in treatment Medication management Consult pharmacist for ideas about optimal management of nausea and sedation  Plan: Phenergan shot now and observe any benefit.  Mandrell Vangilder 03/25/2011, 8:43 AM

## 2011-03-25 NOTE — Progress Notes (Signed)
Patient ID: [name], female   DOB: 12-Dec-1965, 46 y.o.   MRN: [id number] Pt c/o nausea this morning and postponed taking am meds until she received a shot of phenergan.  She got up for lunch but rested through some groups.  She is concerned about not sleeping and is awaiting pharmacy recommendation about her persistant nausea and insomnia.

## 2011-03-25 NOTE — Progress Notes (Signed)
Pt did not attend d/c planning group on this date.  Pt reports not feeling well today, being nausea and not sleeping for 7 days now.  Pt states that she is taking medication for nausea so she can hopefully keep her pills down this morning.  Pt still open to long term treatment.  SW discussed [facility] with pt as an option.  Pt states this is close to her home and would be open to this referral.  SW will make referral to [facility] for substance abuse treatment for pt.     Reyes Ivan, LCSWA 03/25/2011  9:28 AM

## 2011-03-25 NOTE — Progress Notes (Signed)
MEDICATION RELATED CONSULT NOTE - FOLLOW UP   Pharmacy Consult for sleep/nausea  No Known Allergies  Patient Measurements: Height: 5' 4.96" (165 cm) Weight: 181 lb (82.101 kg) IBW/kg (Calculated) : 56.91    Vital Signs: Temp: 98.6 F (37 C) (01/08 0549) Temp src: Oral (01/08 0549) BP: 127/85 mmHg (01/08 0551) Pulse Rate: 101  (01/08 0551)  Labs: No results found for this basename: WBC:3,HGB:3,HCT:3,PLT:3,APTT:3;INR:3,CREATININE:3,LABCREA:3,CREATININE:3,CREAT24HRUR:3,MG:3,PHOS:3,ALBUMIN:3,PROT:3,ALBUMIN:3,AST:3,ALT:3,ALKPHOS:3,BILITOT:3,BILIDIR:3,IBILI:3 in the last 72 hours Estimated Creatinine Clearance: 84.4 ml/min (by C-G formula based on Cr of 0.89).    Medications:  Scheduled:    . celecoxib  200 mg Oral BID  . cloNIDine  0.05 mg Oral Daily  . lithium carbonate  300 mg Oral Q12H  . ondansetron  4 mg Oral Once  . promethazine  12.5 mg Intramuscular NOW  . QUEtiapine  50 mg Oral BID  . QUEtiapine  800 mg Oral QHS  . DISCONTD: promethazine  12.5 mg Intravenous NOW    Assessment: Pt complaining of sleep disturbance and nausea and vomiting.  Has been given phenergan and zofran.  Is currently on Ambien 10 mg for sleep with minimal to no benefit as patient has stated she has not slept in 6 days.   Goal of Therapy:  To provide relief of nausea  To provide appropriate sleep.  Recommendation:  Possible intervention for nausea and stomach discomfort.   1.  Stop lithium as it can cause GI distress. Obtain Litium level  2.  Increase Zofran to 4 mg every 4 hours as need for n/v.  Consider addition of Carafate 1 gm achs x 72 hours for continued stomach discomfort.  Another option would be Pepcid 20 mg bid for stomach upset.  If stomach upset is cramping as a result of detox then reglan 10 mg achs may be an appropriate alternative.     Possible Interventions for sleep   1.  Add Trazodone 50 mg hs may repeat x 1 if needed or Rozerem 8 mg hs.  Please note that rozerem may have  a slightly delayed benefit.      [name] 03/25/2011,10:53 AM

## 2011-03-25 NOTE — Progress Notes (Signed)
Patient's mood and affect seemed flat and depressed during this assessment. Although , she reported a good day and very cooperative. She denied SI/HI and denied hallucinations. Q 15 minute check continues to maintain safety.

## 2011-03-25 NOTE — Progress Notes (Signed)
[facility] Group Notes: (Counselor/Nursing/MHT/Case Management/Adjunct) 03/25/2011   @  11:00am   Type of Therapy:  Group Therapy  Participation Level:  Minimal  Participation Quality:  Attentive  Affect:  Appropriate  Cognitive:  Appropriate  Insight:  None  Engagement in Group: None  Engagement in Therapy:  None  Modes of Intervention:  Support and Exploration  Summary of Progress/Problems: [place]  was attentive but not engaged in group process    [name] 03/20/2011  3:56 PM

## 2011-03-25 NOTE — Progress Notes (Signed)
[facility] Group Notes:  (Counselor/Nursing/MHT/Case Management/Adjunct) 1:15pm   Type of Therapy:  Group Therapy  Participation Level:  Did Not Attend  [name] 03/25/2011  4:25 PM

## 2011-03-25 NOTE — Progress Notes (Signed)
Pharmacist note appreciated and some recommendations followed.  She has gotten the metabolic syndrome from Tegretol and Depakote.

## 2011-03-26 ENCOUNTER — Encounter (HOSPITAL_COMMUNITY): Payer: Self-pay | Admitting: Cardiology

## 2011-03-26 ENCOUNTER — Emergency Department (HOSPITAL_COMMUNITY)
Admission: EM | Admit: 2011-03-26 | Discharge: 2011-03-26 | Disposition: A | Discharge status: Home / Self Care | Payer: Medicare Other | Source: Home / Self Care | Attending: Emergency Medicine | Admitting: Emergency Medicine

## 2011-03-26 ENCOUNTER — Other Ambulatory Visit: Payer: Self-pay

## 2011-03-26 ENCOUNTER — Encounter (HOSPITAL_COMMUNITY): Payer: Self-pay | Admitting: Emergency Medicine

## 2011-03-26 ENCOUNTER — Inpatient Hospital Stay (HOSPITAL_COMMUNITY): Payer: Medicare Other

## 2011-03-26 LAB — BASIC METABOLIC PANEL
CO2: 26 mEq/L (ref 19–32)
Chloride: 103 mEq/L (ref 96–112)
Creatinine, Ser: 0.91 mg/dL (ref 0.50–1.10)
GFR calc Af Amer: 87 mL/min — ABNORMAL LOW (ref >90)
Potassium: 3.6 mEq/L (ref 3.5–5.1)
Sodium: 138 mEq/L (ref 135–145)

## 2011-03-26 LAB — CBC
Hemoglobin: 13.4 g/dL (ref 12.0–15.0)
MCH: 29.1 pg (ref 26.0–34.0)
MCV: 83 fL (ref 78.0–100.0)
RBC: 4.6 MIL/uL (ref 3.87–5.11)

## 2011-03-26 LAB — DIFFERENTIAL
Basophils Absolute: 0 10*3/uL (ref 0.0–0.1)
Basophils Relative: 0 % (ref 0–1)
Lymphocytes Relative: 33 % (ref 12–46)

## 2011-03-26 LAB — POCT I-STAT TROPONIN I: Troponin i, poc: 0 ng/mL (ref 0.00–0.08)

## 2011-03-26 LAB — D-DIMER, QUANTITATIVE: D-Dimer, Quant: 0.3 ug/mL-FEU (ref 0.00–0.48)

## 2011-03-26 MED ORDER — ALBUTEROL SULFATE HFA 108 (90 BASE) MCG/ACT IN AERS
2.0000 | INHALATION_SPRAY | Freq: Once | RESPIRATORY_TRACT | Status: AC
  Administered 2011-03-27: 2 via RESPIRATORY_TRACT
  Filled 2011-03-26: qty 6.7

## 2011-03-26 MED ORDER — TRAZODONE HCL 100 MG PO TABS
400.0000 mg | ORAL_TABLET | Freq: Every day | ORAL | Status: DC
  Administered 2011-03-27 – 2011-03-30 (×5): 400 mg via ORAL
  Filled 2011-03-26 (×8): qty 4

## 2011-03-26 MED ORDER — PANTOPRAZOLE SODIUM 20 MG PO TBEC: 20.0000 mg | DELAYED_RELEASE_TABLET | Freq: Every day | ORAL | Status: DC

## 2011-03-26 MED ORDER — SUMATRIPTAN SUCCINATE 25 MG PO TABS
25.0000 mg | ORAL_TABLET | ORAL | Status: DC | PRN
  Administered 2011-03-26 – 2011-03-29 (×2): 25 mg via ORAL
  Filled 2011-03-26 (×3): qty 1

## 2011-03-26 MED ORDER — PANTOPRAZOLE SODIUM 40 MG PO TBEC
40.0000 mg | DELAYED_RELEASE_TABLET | Freq: Every day | ORAL | Status: DC
  Administered 2011-03-27 – 2011-03-31 (×4): 40 mg via ORAL
  Filled 2011-03-26 (×7): qty 1

## 2011-03-26 MED ORDER — QUETIAPINE FUMARATE ER 50 MG PO TB24
50.0000 mg | ORAL_TABLET | Freq: Four times a day (QID) | ORAL | Status: DC
  Administered 2011-03-27 – 2011-03-31 (×18): 50 mg via ORAL
  Filled 2011-03-26 (×22): qty 1

## 2011-03-26 MED ORDER — ACETAMINOPHEN 325 MG PO TABS
650.0000 mg | ORAL_TABLET | Freq: Once | ORAL | Status: DC
  Filled 2011-03-26: qty 2

## 2011-03-26 NOTE — ED Notes (Signed)
Patient at behavioral health last night developed chest pain and today continued from 3/10 to 6/10 pressure.  Ax4.  Patient received one nitro and 324mg  aspirin. Per EMS

## 2011-03-26 NOTE — ED Provider Notes (Signed)
History     CSN: [id number]  Arrival date & time 03/26/11  1850   None     No chief complaint on file.   (Consider location/radiation/quality/duration/timing/severity/associated sxs/prior treatment) Patient is a 46 y.o. female presenting with chest pain. The history is provided by the patient.  Chest Pain The chest pain began yesterday. Chest pain occurs constantly. The chest pain is unchanged. The pain is associated with breathing. At its most intense, the pain is at 7/10. The pain is currently at 5/10. The quality of the pain is described as pressure-like. The pain does not radiate. Chest pain is worsened by deep breathing and exertion. Primary symptoms include shortness of breath, nausea, vomiting and dizziness. Pertinent negatives for primary symptoms include no fever, no fatigue, no cough, no wheezing and no abdominal pain.  Dizziness also occurs with nausea and vomiting.    Pt states she is transferred from [facility] where she is being treated for substance abuse and SI. Pt states yesterday she developed sudden onset of sternal chest pain. States pain has been there since. Pt reports weakness, SOB, generalized malaise, nausea. States pain worsened with exertion and deep breaths. Denies hx of blood clots, no recent travel, surgeries.   Past Medical History  Diagnosis Date  . Bipolar 1 disorder   . Bipolar 1 disorder   . Dysrhythmia     Left sided heart diease  . Mental disorder   . Bipolar affective disorder, mixed, in partial or remission   . Headache   . Anxiety   . Renal disorder   . Kidney stones   . Depression     Past Surgical History  Procedure Date  . Vaginal hysterectomy   . Cholecystectomy   . Abdominal hysterectomy   . Foot surgery     History reviewed. No pertinent family history.  History  Substance Use Topics  . Smoking status: Never Smoker   . Smokeless tobacco: Not on file  . Alcohol Use: No    OB History    Grav Para Term Preterm  Abortions TAB SAB Ect Mult Living                  Review of Systems  Constitutional: Negative for fever and fatigue.  HENT: Negative.   Eyes: Negative.   Respiratory: Positive for chest tightness and shortness of breath. Negative for cough and wheezing.   Cardiovascular: Positive for chest pain. Negative for leg swelling.  Gastrointestinal: Positive for nausea and vomiting. Negative for abdominal pain.  Genitourinary: Negative.   Musculoskeletal: Negative.   Skin: Negative.   Neurological: Positive for dizziness.  Psychiatric/Behavioral: The patient is nervous/anxious.     Allergies  Review of patient's allergies indicates no known allergies.  Home Medications   Current Outpatient Rx  Name Route Sig Dispense Refill  . FENTANYL 75 MCG/HR TD PT72 Transdermal Place 1 patch onto the skin every 3 (three) days.      Marland Kitchen GABAPENTIN 300 MG PO CAPS Oral Take 300 mg by mouth daily.      . OXYCODONE-ACETAMINOPHEN 5-325 MG PO TABS Oral Take 1-2 tablets by mouth every 6 (six) hours as needed for pain. 20 tablet 0  . QUETIAPINE FUMARATE 400 MG PO TABS Oral Take 800 mg by mouth at bedtime.      Marland Kitchen ZOLPIDEM TARTRATE 10 MG PO TABS Oral Take 10 mg by mouth at bedtime. For sleep      BP 112/75  Pulse 106  Temp(Src) 98.1 F (36.7 C) (  Oral)  Resp 16  Ht 5' 4.96" (1.65 m)  Wt 181 lb (82.101 kg)  BMI 30.16 kg/m2  SpO2 97%  Physical Exam  Nursing note and vitals reviewed. Constitutional: She is oriented to person, place, and time. She appears well-developed and well-nourished. No distress.  HENT:  Head: Normocephalic and atraumatic.  Eyes: Conjunctivae are normal.  Neck: Neck supple.  Cardiovascular: Normal rate, regular rhythm and normal heart sounds.   Pulmonary/Chest: Breath sounds normal. No respiratory distress. She has no wheezes.  Abdominal: Soft. Bowel sounds are normal. She exhibits distension.  Musculoskeletal: Normal range of motion. She exhibits no edema and no tenderness.        No calf swelling or tenderness  Neurological: She is alert and oriented to person, place, and time.  Skin: Skin is warm and dry. No erythema.  Psychiatric: She has a normal mood and affect.    ED Course  Procedures (including critical care time)  Pt with constant atypical CP since yesterday. Pleuritic. Pt appears anxious. Will get labs, d dimer, cxr. Will monitor.   Results for orders placed during the hospital encounter of 03/18/11  LITHIUM LEVEL      Component Value Range   Lithium Lvl 0.28 (*) 0.80 - 1.40 (mEq/L)  CBC      Component Value Range   WBC 6.5  4.0 - 10.5 (K/uL)   RBC 4.60  3.87 - 5.11 (MIL/uL)   Hemoglobin 13.4  12.0 - 15.0 (g/dL)   HCT 45.4  09.8 - 11.9 (%)   MCV 83.0  78.0 - 100.0 (fL)   MCH 29.1  26.0 - 34.0 (pg)   MCHC 35.1  30.0 - 36.0 (g/dL)   RDW 14.7  82.9 - 56.2 (%)   Platelets 208  150 - 400 (K/uL)  DIFFERENTIAL      Component Value Range   Neutrophils Relative 60  43 - 77 (%)   Neutro Abs 3.9  1.7 - 7.7 (K/uL)   Lymphocytes Relative 33  12 - 46 (%)   Lymphs Abs 2.2  0.7 - 4.0 (K/uL)   Monocytes Relative 7  3 - 12 (%)   Monocytes Absolute 0.5  0.1 - 1.0 (K/uL)   Eosinophils Relative 0  0 - 5 (%)   Eosinophils Absolute 0.0  0.0 - 0.7 (K/uL)   Basophils Relative 0  0 - 1 (%)   Basophils Absolute 0.0  0.0 - 0.1 (K/uL)  BASIC METABOLIC PANEL      Component Value Range   Sodium 138  135 - 145 (mEq/L)   Potassium 3.6  3.5 - 5.1 (mEq/L)   Chloride 103  96 - 112 (mEq/L)   CO2 26  19 - 32 (mEq/L)   Glucose, Bld 104 (*) 70 - 99 (mg/dL)   BUN 13  6 - 23 (mg/dL)   Creatinine, Ser 1.30  0.50 - 1.10 (mg/dL)   Calcium 9.4  8.4 - 86.5 (mg/dL)   GFR calc non Af Amer 75 (*) >90 (mL/min)   GFR calc Af Amer 87 (*) >90 (mL/min)  D-DIMER, QUANTITATIVE      Component Value Range   D-Dimer, Quant 0.30  0.00 - 0.48 (ug/mL-FEU)  POCT I-STAT TROPONIN I      Component Value Range   Troponin i, poc 0.00  0.00 - 0.08 (ng/mL)   Comment 3            Dg Chest 2  View  03/26/2011  *RADIOLOGY REPORT*  Clinical Data: Shortness of breath  and chest pain.  CHEST - 2 VIEW  Comparison: 07/30/2010  Findings: The lungs are clear without focal consolidation, edema, effusion or pneumothorax.  Cardiopericardial silhouette is within normal limits for size.  Imaged bony structures of the thorax are intact.  IMPRESSION: Stable.  No acute findings.  Original Report Authenticated By: ERIC A. MANSELL, M.D.    Date: 03/26/2011  Rate: 85  Rhythm: normal sinus rhythm  QRS Axis: normal  Intervals: normal  ST/T Wave abnormalities: nonspecific T wave changes  Conduction Disutrbances:none  Narrative Interpretation:   Old EKG Reviewed: unchanged   Negative troponn x1, pain since yesterday, would be elevated if MI. Negative CXR. Negative d dimer. History of same pain in the past, was hospitalized and ruled out in 2011, thought to be due to GERD. Was started on PPI at that time. Pt was also referred to cardiology, but missed 3 appointments according to the chart. At this time, VS are all within normal. Will treat pain with tylenol, albuterol, will start on PPI, will follow up with cardiology after discharge from [facility].      MDM          Lottie Mussel, PA 03/26/11 2227

## 2011-03-26 NOTE — ED Notes (Signed)
Pt watching tv after returning from radiology. Pt in NAD and sitter in room with pt.Awaiting Lithium from pharmacy. Pt stated CP some better but still has midsternal pain.

## 2011-03-26 NOTE — ED Notes (Signed)
Completed EKG at 1723 Problem with computer EPIC system

## 2011-03-26 NOTE — ED Notes (Signed)
EKG per EMS unremarkable. EMS gave 324mg  ASA and 1 nitro.

## 2011-03-26 NOTE — ED Notes (Signed)
Pt to department from [facility] with midsternal chest pain that that radiates to the left breast that started yesterday. Pt denies any n/v or diaphoresis with the pain. Bp- 132/86 Hr-108.

## 2011-03-26 NOTE — Progress Notes (Signed)
[facility] Group Notes:  (Counselor/Nursing/MHT/Case Management/Adjunct) 1:15pm   Type of Therapy:  Group Therapy  Participation Level:  Did Not Attend    [name] [name] 03/26/2011  2:58 PM      

## 2011-03-26 NOTE — ED Notes (Signed)
Lebron Conners from [employer] given report. Administration transferred chart to [facility] Per request.

## 2011-03-26 NOTE — Progress Notes (Signed)
Patient ID: [name], female   DOB: 1966-02-18, 46 y.o.   MRN: [id number] At 2330, patient came up to medication window requesting second dose of PRN trazodone for sleep as patient had unsuccessfully fallen asleep. Patient stated at window that she felt dizzy and was given chair to sit in. Given cup of water. After medications were given, asked patient to get up slowly and offered to walk with patient back to room. Patient then stated that she did not feel dizzy immediately after medications were given.  At 2355, patient was asleep in bed, resting with eyes closed. Appears in no distress. Respirations even, normal and unlabored. Laying on left side in bed.

## 2011-03-26 NOTE — ED Notes (Signed)
Security notified for pt transport to [facility]

## 2011-03-26 NOTE — Progress Notes (Signed)
[facility] Group Notes: (Counselor/Nursing/MHT/Case Management/Adjunct) 03/26/2011   @  11:00am   Type of Therapy:  Group Therapy  Participation Level:  Minimal  Participation Quality:  Attentive  Affect:  Blunted  Cognitive:  Appropriate  Insight:  None  Engagement in Group: Limited  Engagement in Therapy:  None  Modes of Intervention:  Support and Exploration  Summary of Progress/Problems: [name] came to group late and listened to the statements of other group members. Although she seemed attentive, she did not engage personally or verbally in the group.  [name] 03/20/2011  3:56 PM

## 2011-03-26 NOTE — Progress Notes (Addendum)
Pt complains of not being able to sleep. Pt has attended some groups but is very restless. Pt was offered support and encouragement. Pt will be going to [facility] and doing there residential program next Wednesday.Pt is receptive to treatment and safety maintained on unit.

## 2011-03-26 NOTE — BH Assessment (Signed)
Pt c/o pressure in her chest with dyspnea and pain of 6/10 at 1530 hrs. V/S are WNL and pt is not in any distress. Pt states that she began having this feeling last night but did not say anything until now. Pt states that the pain does not radiate, but pt says it is difficult for her to take a deep breath. EKG performed and results transmitted. Writer instructed to call paramedics for evaluation. Pt was transported at 1645 by ambulance to [place] ED.

## 2011-03-26 NOTE — ED Notes (Signed)
Patient states at behavioral health last night and developed chest pain continued today currently 6/10 pressure with no relief with nitro by EMS.  Airway intact bilateral equal chest rise and fall.  Last night when patient had chest pain 2330 patient heard sirens. Denies hearing any sound currently. Patient admitted in behavioral health for Bipolar depression substance abuse to opiates. Denies SI and HI.  Calm cooperative

## 2011-03-26 NOTE — Progress Notes (Signed)
Patient ID: [name], female   DOB: 1966/03/05, 46 y.o.   MRN: [id number]  Patient is transferred to [facility] ED for evaluation of complaint of pressure to chest area and inability to take a deep breath since last night. Patient admits history of pressure to chest areas in the past and up to a week hospitalization as result. She currently denies Chest pains, denies radiation of pain. Complains of numbness of her upper extremities of which she states was norm for her due to previous history of nerve damage. EKG result done here at the [facility] indicates normal sinus rhythm. To rule out and or confirm any acute and or underlying cardiac issues, patient is sent to [facility] ED for further and more advanced tests and evaluations.

## 2011-03-26 NOTE — Progress Notes (Signed)
Patient ID: [name], female   DOB: 1965/09/12, 46 y.o.   MRN: [id number] Pt comes to office reporting that she was sent by the nurses because she can't catch her breath.  She says that she has gone to the ED and hospital for a week at a time twice with this same thing.  It seems that when she is exhausted she gets this feeling that she can't catch her breath.  She feels pressure on her chest, no pain, no radiating, just pressure.  She declines to take any nitroglycerine here now.  She reported that the Seroquel XR 50 mg does help her anxiety a little, so will increase the frequency of that.  Will get vitals and EKG now.  Aggie the nurse practitioner was present during this examination.

## 2011-03-26 NOTE — Progress Notes (Addendum)
[facility] MD Progress Note  03/26/2011 9:36 AM  ADL's:  Intact  Sleep:  No on 200 Thrazodone at 2200 and 100 more at 2330.  She has been used to 400 mg of Trazodone along with Seroquel 800 and Ambien at HS for years.  Will go with 400 mg of Trazodone tonight.  Appetite:  Not real good, she eats mostly dinner.  Her nausea is a little bit better.  Suicidal Ideation:   Plan:  No  Intent:  No  Means:  No  Homicidal Ideation:   Plan:  No  Intent:  No  Means:  No  Mental Status: General Appearance /Behavior:  Casual Eye Contact:  Good Motor Behavior:  Normal Speech:  Normal Level of Consciousness:  Alert Mood:  7/10 (1 best 10 the worst) Affect:  Constricted Anxiety Level:  7/10 (1 best 10 the worst) Thought Process:  Coherent Thought Content:  WNL Perception:  Normal Judgment:  Fair Insight:  Impaired Cognition:  Orientation time, place and person Concentration Yes Sleep:  Number of Hours: 5.5   Vital Signs:Blood pressure 112/81, pulse 130, temperature 97.2 F (36.2 C), temperature source Oral, resp. rate 16, height 5' 4.96" (1.65 m), weight 82.101 kg (181 lb).  Lab Results:  Results for orders placed during the hospital encounter of 03/18/11 (from the past 48 hour(s))  LITHIUM LEVEL     Status: Abnormal   Collection Time   03/25/11  7:49 PM      Component Value Range Comment   Lithium Lvl 0.28 (*) 0.80 - 1.40 (mEq/L)     Physical Findings: AIMS:  , ,  ,  ,    CIWA:  CIWA-Ar Total: 3  COWS:  COWS Total Score: 4   Treatment Plan Summary: Daily contact with patient to assess and evaluate symptoms and progress in treatment Medication management  Plan: Will push Trazodone to 400 mg tonight.  Her dizziness may be related to the Trazodone, but if she could get to sleep and not be standing asking for more meds, then she probably would not have any dizziness.  Pt asks for something for migraine.  Will give Imitrex.  Eh Sesay 03/26/2011, 9:36 AM

## 2011-03-26 NOTE — Progress Notes (Signed)
Pt attended discharge planning group and actively participated.  Pt presents with flat affect and depressed mood.  Pt announced this was day 8 of no sleep.  SW discussed pt's reason for feeling why she isn't sleeping and what she is doing instead.  Pt states she is resting, laying awake and staring out the window instead.  Pt states if she was home she would be on the computer or cell phone to distract herself.  Pt states she was up vomitting all last night and doesn't feel well today.  Pt states she spoke with her husband about [facility] and he now doesn't want her to get inpt treatment.  Pt states that she thinks he's getting tired of caring for the kids and is ready for her to be home.  SW discussed inpt treatment being his idea to begin with.  SW will encourage pt to go to [facility] on Wednesday and continue to hold this bed, in the event pt changes her mind as it would be beneficial for pt to get treatment.  No further needs at this time.  Safety planning and suicide prevention discussed.     [name], LCSWA 03/26/2011  11:04 AM   Per State Regulation 482.30 This Chart was reviewed for medical necessity with respect to the patient's Admission/Duration of stay.   [name]  03/26/2011  Next Review Date:  03/30/12

## 2011-03-27 NOTE — Progress Notes (Signed)
[facility] Group Notes: (Counselor/Nursing/MHT/Case Management/Adjunct) 03/27/2011   @  11:00am   Type of Therapy:  Group Therapy  Participation Level:  Limited  Participation Quality:  Attentive, sharing  Affect:  Blunted  Cognitive:  Appropriate  Insight:  Limited  Engagement in Group: Limited  Engagement in Therapy:  Limited  Modes of Intervention:  Support and Exploration  Summary of Progress/Problems: Thania was quiet throughout much of the group, stating that she does not know what balance means to her. However, at the end of the group she asked for suggestions about finding balance with her 45 year old disabled daughter, who she is payee for and thus wants to maintain some control over, but who is trying to exercise he independence. Saja was open to feedback from other group members.   [name] 03/27/2011 3:47 PM

## 2011-03-27 NOTE — Treatment Plan (Signed)
Interdisciplinary Treatment Plan Update (Adult)  Date:  03/27/2011  Time Reviewed:  10:42 AM   Progress in Treatment: Attending groups: Yes Participating in groups:  Yes Taking medication as prescribed:  Yes Tolerating medication: Yes Family/Significant othe contact made:   Patient understands diagnosis:  Yes Discussing patient identified problems/goals with staff:  Yes Medical problems stabilized or resolved:  Yes Denies suicidal/homicidal ideation: Yes Issues/concerns per patient self-inventory:  Yes Other:  New problem(s) identified: None identified  Reason for Continuation of Hospitalization: Anxiety Depression  Interventions implemented related to continuation of hospitalization: mood stabilization, safety checks q 15 mins  Additional comments: SW will assess for appropriate referrals for pt  Estimated length of stay: 3 -5 days  Discharge Plan: F/U with [facility] for mental health services  New goal(s): N/A  Review of initial/current patient goals per problem list:   1.  Goal(s): Depression  Met:  No  Target date: by discharge  As evidenced by: Reduce depression from a 10 to a 3  2.  Goal (s): Anxiety  Met:  No   Target date: by discharge  As evidenced by: Reduce Anxiety from a 10 to a 3  3.  Goal(s): Suicidal Ideation  Met:  No  Target date: by discharge  As evidenced by: Eliminate SI  4.  Goal(s):  Met:    Target date:  As evidenced by:   Attendees: Patient:  [name]   Family:     Physician:  Orson Aloe, MD 03/27/2011 10:42 AM   Nursing:   Andree Elk, RN 03/27/2011 10:42 AM   Case Manager:  Estevan Ryder, LCSWA 03/27/2011 10:42 AM   Counselor:  Angus Palms, LCSW 03/27/2011 10:42 AM   Other:  Juline Patch, LCSW  03/27/2011 10:42 AM   Other:  Serena Colonel, FNP-BC 03/27/2011 10:42 AM   Other:  Neill Loft, RN   Other:      Scribe for Treatment Team:   Calvert Cantor, 03/27/2011 10:42 AM

## 2011-03-27 NOTE — ED Provider Notes (Signed)
Medical screening examination/treatment/procedure(s) were performed by non-physician practitioner and as supervising physician I was immediately available for consultation/collaboration.  Flint Melter, MD 03/27/11 [phone]

## 2011-03-27 NOTE — Progress Notes (Addendum)
Reportedly patient was returned to Unit from [facility] ED last evening aound 23:00.  Was medicated by nurse Dois Davenport, RN.  Sleeping at long intervals when assessed and observed during night shift.  No complaint of chest discomfort or other physical or behavioral problems. Safety maintained via Q 15 min safety checks.

## 2011-03-27 NOTE — Progress Notes (Signed)
[facility] Group Notes:  (Counselor/Nursing/MHT/Case Management/Adjunct) 1:15pm   Type of Therapy:  Group Therapy  Participation Level:  Did Not Attend       [name] 03/27/2011  4:10 PM   

## 2011-03-27 NOTE — Progress Notes (Addendum)
Pt attended discharge planning group this morning and actively participated.  Pt stated that her chest has been feeling heavy when she stands up and make her feel like she is going to faint.  Pt reported feeling this way for the last couple of weeks.  Pt reported her depression level at a six and her anxiety level at a seven.  In addition, she stated that she has not been sleeping well, yet the doctor is currently adjusting her medication.  Pt reported her depression level at a six and anxiety level is at a seven.  Pt denies SI/HI.  Pt's husband will pick her up upon discharge.  She no longer wants to attend residential treatment at [facility] residential.  CM called pt's previous provider and scheduled pt an appointment with Dr. Johnney Ou at [facility] in [place], [place] on 04/11/11 at 4:00pm.  CM will make pt an appointment with a therapist in the local community.  Upon discharge the pt will return home with her husband and children.  Pt does have access to medication.  Garmon Dehn Claudette Laws, [place] 03/27/2011  2:17 PM   Per State Regulation 482.30 This chart was reviewed for medical necessity with respect to the patients Admission/Duration of stay.  Next review date on 03/30/11  Estevan Ryder, Theresia Majors 03/27/2011  2:57 PM

## 2011-03-27 NOTE — Progress Notes (Signed)
[facility] MD Progress Note  03/27/2011 4:34 PM  "I am O. K. I am getting to go to dinner. My husband and my 2 children are coming to see me. I am excited. I slept some last night, about 3 hours".  Diagnosis:   Axis I: Bipolar 1 disorder. Axis II: Deferred Axis III:  Past Medical History  Diagnosis Date  . Bipolar 1 disorder   . Bipolar 1 disorder   . Dysrhythmia     Left sided heart diease  . Mental disorder   . Bipolar affective disorder, mixed, in partial or remission   . Headache   . Anxiety   . Renal disorder   . Kidney stones   . Depression    Axis IV: No changes Axis V: 51-60 moderate symptoms  ADL's:  Intact  Sleep:  No "I slept about 3 hours"  Appetite:  Yes,  AEB: "I eat fairly"  Suicidal Ideation:   Plan:  No  Intent:  No  Means:  No  Homicidal Ideation:   Plan:  No  Intent:  No  Means:  No  AEB (as evidenced by):  Mental Status: General Appearance [name]:  Casual Eye Contact:  Good Motor Behavior:  Normal Speech:  Normal Level of Consciousness:  Alert Mood:  "Excited, my husband and 2 children are coming to see me during dinner" Affect:  Appropriate Anxiety Level:  Minimal Thought Process:  Coherent Thought Content:  WNL Perception:  Normal Judgment:  Fair Insight:  Present Cognition:  Orientation time, place and person Sleep:  Number of Hours: 3.25   Vital Signs:Blood pressure 100/71, pulse 129, temperature 98.6 F (37 C), temperature source Oral, resp. rate 16, height 5' 4.96" (1.65 m), weight 82.101 kg (181 lb), SpO2 100.00%.  Lab Results:  Results for orders placed during the hospital encounter of 03/18/11 (from the past 48 hour(s))  LITHIUM LEVEL     Status: Abnormal   Collection Time   03/25/11  7:49 PM      Component Value Range Comment   Lithium Lvl 0.28 (*) 0.80 - 1.40 (mEq/L)   CBC     Status: Normal   Collection Time   03/26/11  7:40 PM      Component Value Range Comment   WBC 6.5  4.0 - 10.5 (K/uL)    RBC 4.60  3.87 -  5.11 (MIL/uL)    Hemoglobin 13.4  12.0 - 15.0 (g/dL)    HCT 40.9  81.1 - 91.4 (%)    MCV 83.0  78.0 - 100.0 (fL)    MCH 29.1  26.0 - 34.0 (pg)    MCHC 35.1  30.0 - 36.0 (g/dL)    RDW 78.2  95.6 - 21.3 (%)    Platelets 208  150 - 400 (K/uL)   DIFFERENTIAL     Status: Normal   Collection Time   03/26/11  7:40 PM      Component Value Range Comment   Neutrophils Relative 60  43 - 77 (%)    Neutro Abs 3.9  1.7 - 7.7 (K/uL)    Lymphocytes Relative 33  12 - 46 (%)    Lymphs Abs 2.2  0.7 - 4.0 (K/uL)    Monocytes Relative 7  3 - 12 (%)    Monocytes Absolute 0.5  0.1 - 1.0 (K/uL)    Eosinophils Relative 0  0 - 5 (%)    Eosinophils Absolute 0.0  0.0 - 0.7 (K/uL)    Basophils Relative 0  0 -  1 (%)    Basophils Absolute 0.0  0.0 - 0.1 (K/uL)   BASIC METABOLIC PANEL     Status: Abnormal   Collection Time   03/26/11  7:40 PM      Component Value Range Comment   Sodium 138  135 - 145 (mEq/L)    Potassium 3.6  3.5 - 5.1 (mEq/L)    Chloride 103  96 - 112 (mEq/L)    CO2 26  19 - 32 (mEq/L)    Glucose, Bld 104 (*) 70 - 99 (mg/dL)    BUN 13  6 - 23 (mg/dL)    Creatinine, Ser 1.61  0.50 - 1.10 (mg/dL)    Calcium 9.4  8.4 - 10.5 (mg/dL)    GFR calc non Af Amer 75 (*) >90 (mL/min)    GFR calc Af Amer 87 (*) >90 (mL/min)   D-DIMER, QUANTITATIVE     Status: Normal   Collection Time   03/26/11  7:40 PM      Component Value Range Comment   D-Dimer, Quant 0.30  0.00 - 0.48 (ug/mL-FEU)   POCT I-STAT TROPONIN I     Status: Normal   Collection Time   03/26/11  8:16 PM      Component Value Range Comment   Troponin i, poc 0.00  0.00 - 0.08 (ng/mL)    Comment 3              Physical Findings: AIMS:  , ,  ,  ,    CIWA:  CIWA-Ar Total: 0  COWS:  COWS Total Score: 4   Treatment Plan Summary: Daily contact with patient to assess and evaluate symptoms and progress in treatment Medication management. Reviewed ED lab findings of 03/26/11 cardiac workup. no abnormality indicated or noted.  Plan: Continue  current treatment plan.  Armandina Stammer I 03/27/2011, 4:34 PM

## 2011-03-27 NOTE — Progress Notes (Signed)
Patient ID: [name], female   DOB: 01/23/1966, 46 y.o.   MRN: [id number] Pt continues to c/o nausea.  She reports poor sleep as well.  She reports eating little and low energy.  She reports her ability to pay attention is good and she rates her depression and her hopelessness a 6 on her self inventory. She denies any thoughts of self harm and says she needs to focus on taking time for herself.

## 2011-03-28 NOTE — Progress Notes (Signed)
Pt. Denies SI/HI and denies A/V hallucinations.  Pt. Has a flat affect, depressed mood.  Encouragement and support given.  Pr. Receptive.

## 2011-03-28 NOTE — Progress Notes (Signed)
[facility] Group Notes: (Counselor/Nursing/MHT/Case Management/Adjunct) 03/28/2011   @1 :15pm  Type of Therapy:  Group Therapy  Participation Level:  Active  Participation Quality:  Attentive, Sharing  Affect:  Appropriate  Cognitive:  Appropriate  Insight:  Limited  Engagement in Group: Good  Engagement in Therapy:  Good  Modes of Intervention:  Support and Exploration  Summary of Progress/Problems: [name] participated in progressive muscle relaxation exercise and processed his experience. She stated that the exercise was helpful to her in relaxing.   [name] 03/28/2011 3:32 PM

## 2011-03-28 NOTE — Progress Notes (Addendum)
Patient seen during d/c planning group.  She continues to endorse depression at five and anxiety at seven.  Patient shared that she will need a local therapist upon discharge.  Referral to be made as requested.  Per State Regulation 482.30 This chart was reviewed for medical necessity with respect to the patient's  Admission/Duration of Stay  [facility], LCSW @1 /01/2012    Next Review Date 03/31/11

## 2011-03-28 NOTE — Progress Notes (Signed)
Recreation Therapy Notes  03/28/2011         Time: 96045      Group Topic/Focus: The focus of this group is on enhancing the patient's understanding of leisure, barriers to leisure, and the importance of engaging in positive leisure activities upon discharge for improved total health.  Participation Level: Active  Participation Quality: Appropriate and Attentive  Affect: Appropriate  Cognitive: Oriented   Additional Comments: None.   [name] 03/28/2011 3:55 PM

## 2011-03-28 NOTE — Progress Notes (Signed)
Pt resting quietly with eyes closed. Respirations even and unlabored. No distress noted. 

## 2011-03-28 NOTE — Progress Notes (Signed)
[facility] MD Progress Note  03/28/2011 3:58 PM  "I am a lot better today. My mood is improving. My sleep is getting better as well. I think I am getting at least 4 hours of sleep or so. That is better than the previous nights"  Diagnosis:   Axis I: Bipolar 1 disorder Axis II: Deferred Axis III:  Past Medical History  Diagnosis Date  . Bipolar 1 disorder   . Bipolar 1 disorder   . Dysrhythmia     Left sided heart diease  . Mental disorder   . Bipolar affective disorder, mixed, in partial or remission   . Headache   . Anxiety   . Renal disorder   . Kidney stones   . Depression    Axis IV: No changes Axis V: 51-60 moderate symptoms  ADL's:  Intact  Sleep:  Yes,  AEB: 6.5 hours per documentation.  Appetite:  Yes,  AEB: "I am eating well"  Suicidal Ideation:   Plan:  No  Intent:  No  Means:  No  Homicidal Ideation:   Plan:  No  Intent:  No  Means:  No   Mental Status: General Appearance /Behavior:  Casual Eye Contact:  Good Motor Behavior:  Normal Speech:  Normal Level of Consciousness:  Alert Mood:  Euthymic Affect:  Appropriate Anxiety Level:  Minimal Thought Process:  Coherent and Intact Thought Content:  WNL Perception:  Normal Judgment:  Fair Insight:  Present Cognition:  Orientation time, place and person Sleep:  Number of Hours: 6.5   Vital Signs:Blood pressure 101/66, pulse 88, temperature 99.5 F (37.5 C), temperature source Oral, resp. rate 16, height 5' 4.96" (1.65 m), weight 82.101 kg (181 lb), SpO2 100.00%.  Lab Results:  Results for orders placed during the hospital encounter of 03/18/11 (from the past 48 hour(s))  CBC     Status: Normal   Collection Time   03/26/11  7:40 PM      Component Value Range Comment   WBC 6.5  4.0 - 10.5 (K/uL)    RBC 4.60  3.87 - 5.11 (MIL/uL)    Hemoglobin 13.4  12.0 - 15.0 (g/dL)    HCT 11.9  14.7 - 82.9 (%)    MCV 83.0  78.0 - 100.0 (fL)    MCH 29.1  26.0 - 34.0 (pg)    MCHC 35.1  30.0 - 36.0 (g/dL)    RDW 56.2  13.0 - 86.5 (%)    Platelets 208  150 - 400 (K/uL)   DIFFERENTIAL     Status: Normal   Collection Time   03/26/11  7:40 PM      Component Value Range Comment   Neutrophils Relative 60  43 - 77 (%)    Neutro Abs 3.9  1.7 - 7.7 (K/uL)    Lymphocytes Relative 33  12 - 46 (%)    Lymphs Abs 2.2  0.7 - 4.0 (K/uL)    Monocytes Relative 7  3 - 12 (%)    Monocytes Absolute 0.5  0.1 - 1.0 (K/uL)    Eosinophils Relative 0  0 - 5 (%)    Eosinophils Absolute 0.0  0.0 - 0.7 (K/uL)    Basophils Relative 0  0 - 1 (%)    Basophils Absolute 0.0  0.0 - 0.1 (K/uL)   BASIC METABOLIC PANEL     Status: Abnormal   Collection Time   03/26/11  7:40 PM      Component Value Range Comment   Sodium 138  135 - 145 (mEq/L)    Potassium 3.6  3.5 - 5.1 (mEq/L)    Chloride 103  96 - 112 (mEq/L)    CO2 26  19 - 32 (mEq/L)    Glucose, Bld 104 (*) 70 - 99 (mg/dL)    BUN 13  6 - 23 (mg/dL)    Creatinine, Ser 9.60  0.50 - 1.10 (mg/dL)    Calcium 9.4  8.4 - 10.5 (mg/dL)    GFR calc non Af Amer 75 (*) >90 (mL/min)    GFR calc Af Amer 87 (*) >90 (mL/min)   D-DIMER, QUANTITATIVE     Status: Normal   Collection Time   03/26/11  7:40 PM      Component Value Range Comment   D-Dimer, Quant 0.30  0.00 - 0.48 (ug/mL-FEU)   POCT I-STAT TROPONIN I     Status: Normal   Collection Time   03/26/11  8:16 PM      Component Value Range Comment   Troponin i, poc 0.00  0.00 - 0.08 (ng/mL)    Comment 3              Physical Findings: AIMS:  , ,  ,  ,    CIWA:  CIWA-Ar Total: 0  COWS:  COWS Total Score: 4   Treatment Plan Summary: Daily contact with patient to assess and evaluate symptoms and progress in treatment Medication management  Plan: Continue current treatment plan.  Armandina Stammer I 03/28/2011, 3:58 PM

## 2011-03-28 NOTE — Progress Notes (Signed)
Pt denies SI/HI/AVH and has attended groups except one.  Cooperative with treatment.  Interacts minimally.  Slow bodily movements.  Flat affect.

## 2011-03-28 NOTE — Progress Notes (Signed)
[facility] Group Notes:  (Counselor/Nursing/MHT/Case Management/Adjunct) 11:00am   Type of Therapy:  Group Therapy  Participation Level:  Did Not Attend       [name] 03/28/2011  3:11 PM

## 2011-03-29 NOTE — Progress Notes (Signed)
Patient ID: KHRISTINE VERNO, female   DOB: [birth year], 46 y.o.   MRN: [id number] 03/29/2011 Nursing D 1500  Pt is up...interacting in the milieu appropriately. She makes minimal eye contact. She takes her meds as orderd. She attends her groups and is engaged in her POC . She completed her  Self inventory sheet and on it she wrote she denied SI, she wrote her depression and hopelessness were " 5 / 2 " and her discharge plan is to take time for herself. A Therapeutic realtionship is supported as POC. R Safety is maintaiend and POC cont PD RN [facility]

## 2011-03-29 NOTE — Progress Notes (Signed)
  [name] is a 46 y.o. female [id number] [birth year]  03/18/2011 Active Problems:  DEPRESSION   Mental Status:alert and oriented. Mood is better than when admitted.Not SI/HI/AVH.  Subjective/Objective: Feels she is starting to sleep better. Says she has decreased sleep for a number of days every few months. It's sort of a cycle.     Filed Vitals:   03/29/11 0706  BP: 107/69  Pulse: 132  Temp:   Resp:     Lab Results:   BMET    Component Value Date/Time   NA 138 03/26/2011 1940   K 3.6 03/26/2011 1940   CL 103 03/26/2011 1940   CO2 26 03/26/2011 1940   GLUCOSE 104* 03/26/2011 1940   BUN 13 03/26/2011 1940   CREATININE 0.91 03/26/2011 1940   CALCIUM 9.4 03/26/2011 1940   GFRNONAA 75* 03/26/2011 1940   GFRAA 87* 03/26/2011 1940    Medications:  Scheduled:     . acetaminophen  650 mg Oral Once  . celecoxib  200 mg Oral BID  . lithium carbonate  300 mg Oral Q12H  . pantoprazole  40 mg Oral Q1200  . QUEtiapine  50 mg Oral QID  . QUEtiapine  800 mg Oral QHS  . traZODone  400 mg Oral QHS     PRN Meds acetaminophen, alum & mag hydroxide-simeth, hydrOXYzine, loperamide, magnesium hydroxide, ondansetron, SUMAtriptan  Plan: continue current plan of care but check Lithium level.   Ferry Matthis,MICKIE D. 03/29/2011

## 2011-03-29 NOTE — Progress Notes (Signed)
Patient ID: [name], female   DOB: 1965/04/19, 46 y.o.   MRN: [id number]   Patient was in the dayroom watching tv  with minimal interaction with peers. Writer introduced self to patient and informed her of her 2000 medication which was due. Pt reported that she has had a good day and voiced no complaints, writer informed patient of her hs scheduled medications and pt agreed to taking them and requested them at 2130. Pt currently denies having pain, -si/hi/a/v hall. Safety maintained on unit will continue to monitor.

## 2011-03-29 NOTE — Progress Notes (Signed)
Patient resting quietly with eyes closed. Respirations even an unlabored. No distress noted

## 2011-03-29 NOTE — Progress Notes (Signed)
[facility] Group Notes:  (Counselor/Nursing/MHT/Case Management/Adjunct)  03/29/2011 12:32 PM Type of Therapy:  After Care Planning Group  Pt. Attended after care planning group and received  [facility] information along with  Crisis and hotline numbers. Pt. Agreed  to use them if needed. Pt. Spoke about sleeping better and states she is doing okay but has some issues with anxiety still.  [name] 03/29/2011, 12:32 PM

## 2011-03-29 NOTE — ED Provider Notes (Signed)
Medical screening examination/treatment/procedure(s) were performed by non-physician practitioner and as supervising physician I was immediately available for consultation/collaboration.  Flint Melter, MD 03/29/11 [phone]

## 2011-03-30 DIAGNOSIS — F259 Schizoaffective disorder, unspecified: Principal | ICD-10-CM

## 2011-03-30 DIAGNOSIS — R45851 Suicidal ideations: Secondary | ICD-10-CM

## 2011-03-30 DIAGNOSIS — F1910 Other psychoactive substance abuse, uncomplicated: Secondary | ICD-10-CM

## 2011-03-30 LAB — LITHIUM LEVEL: Lithium Lvl: 0.88 mEq/L (ref 0.80–1.40)

## 2011-03-30 NOTE — Progress Notes (Signed)
[facility] Group Notes:  (Counselor/Nursing/MHT/Case Management/Adjunct)  03/30/2011 6:17 PM  Type of Therapy:  Group Therapy  Participation Level:  Active  Participation Quality:  Appropriate  Affect:  Appropriate  Cognitive:  Appropriate  Insight:  Good  Engagement in Group:  Good  Engagement in Therapy:  Good  Modes of Intervention:  Clarification and Support  Summary of Progress/Problems: Pt. Participated in group discussion on supports that are healthy and that are unhealthy and each pt. Shared what support they had in their lives and how that person supports them. Each pt. Was encouraged by the therapist to find many healthy supports that they could in order to have a successful recovery. Pt. Spoke about her husband and friend being a emotional support to her.   Lamar Blinks [place] 03/30/2011, 6:17 PM

## 2011-03-30 NOTE — Progress Notes (Signed)
Patient ID: [name], female   DOB: 27-Jul-1965, 46 y.o.   MRN: [id number] 03/30/2011 Nursing D 1730 [name] is status quo. She goes through the motions..taking her meds, attending her groups, processing with the counselors in her groups. She remains quite flat, sad and depressed. A She  Completed her self inventory and on it she wrote that she is not suicidal. That her feelings of depression and hopelessness were " 4/2  ", rspectively and that her DC plans include that she is going to " take time out for herslef". R Safety is  Maintained and POC includes fostering therapeutic realtionship alreaady in place with pt. PD RN [facility]

## 2011-03-30 NOTE — Progress Notes (Signed)
Patient reports having had a good day, writer informed patient that there were no changes to her medications. Pt has been in the dayroom interacting appropriately with peers and attended wrap-up group. Patient has voiced no complaints, currently denies having pain, -si/hi/a/v hall. Safety maintained on unit will continue to monitor.

## 2011-03-30 NOTE — Progress Notes (Signed)
Patient ID: [name], female   DOB: Aug 03, 1965, 46 y.o.   MRN: [id number]  Lithium Level .49  [name] is fully alert today and has been resting in her room.  She reports her depression is a level 4/10 on scale of 1-10 if 10 is the worst symptoms.  She denies suicidal thoughts for the past 48 hours and is asking if she can go home in the am. She denies hopelessness and anxiety. 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   Her sleep has improved and she is sleeping about 4 hours per night, and feeling more rested.    O:  Pleasant 82 y o female, looks younger than stated age. Affect appropriate. In full contact with reality and appears in no distress.  Eye contact is solid, speech fluent and non-pressured.  No delusional statements made. Denying any dangerous thoughts. She plans to follow-up with her psychiatrist in [facility].   P; Appears appropriate for discharge Monday.

## 2011-03-31 DIAGNOSIS — F250 Schizoaffective disorder, bipolar type: Secondary | ICD-10-CM | POA: Diagnosis present

## 2011-03-31 DIAGNOSIS — R079 Chest pain, unspecified: Secondary | ICD-10-CM

## 2011-03-31 MED ORDER — LITHIUM CARBONATE ER 300 MG PO TBCR: 300.0000 mg | EXTENDED_RELEASE_TABLET | Freq: Two times a day (BID) | ORAL | Status: DC

## 2011-03-31 MED ORDER — PANTOPRAZOLE SODIUM 20 MG PO TBEC: 20.0000 mg | DELAYED_RELEASE_TABLET | Freq: Every day | ORAL | Status: DC

## 2011-03-31 MED ORDER — QUETIAPINE FUMARATE 400 MG PO TABS: 800.0000 mg | ORAL_TABLET | Freq: Every day | ORAL | Status: DC

## 2011-03-31 MED ORDER — CELECOXIB 200 MG PO CAPS: 200.0000 mg | ORAL_CAPSULE | Freq: Two times a day (BID) | ORAL | Status: AC

## 2011-03-31 MED ORDER — HYDROXYZINE HCL 25 MG PO TABS: 25.0000 mg | ORAL_TABLET | ORAL | Status: AC | PRN

## 2011-03-31 NOTE — Progress Notes (Signed)
D:  Pt attended wrap-up group tonight.  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  Peers commented to deter talk on drug use.  Pt said," The doctor will not take away my opiates from me.  That's my addiction."  A:  Reported pt interactions with RN.  Redirect pt on talk is inappropriate in the hospital setting.  R:  Pt is safe. Aundria Rud, Kamoni Gentles L, MHT/NS

## 2011-03-31 NOTE — Discharge Summary (Signed)
Patient ID: [name] MRN: [id number] DOB/AGE: 11-18-1965 46 y.o.  Admit date: 03/18/2011 Discharge date: 03/31/2011  Admission Diagnoses: Schizoaffective disorder, bipolar type  Hospital Course: [name] is a 46 year old Caucasian female. Admitted from the [facility]. Patient reports, I came here last night. I am here for my depression and substance abuse issues. I became depressed one year ago after my 49 year old daughter ran away and we could not find her. When we finally found her, she still refused to come home. She went to live with her boyfriend. As I was trying to get her stuff shipped to her, I fell on my porch and broke my Rt. Foot. My doctor put me on pain medications for my foot. As I go to him for follow-up visits, this doctor has no time to listen to me on how I was doing. Rather, he will give me more prescription for more pain pills and said take this for your pain. I was already battling substance abuse problems while this is going on. At this time, I was abusing more and more pills. While a patient in this hospital, patient received medication management as well as group counseling and activities. She frequently complained of insomnia and asked for Ambien 10 mg as she states it was the only thing that helps her sleep. She also reports increased anxiety levels that she describes as manic like.  She was then started on lithium 300 mg bid and Neurontin was discontinued. She reports on daily basis some gradual improvement of symptoms. She also complained of some cardiac issues and was sent to the ER for evaluation.  She is being discharged to her home with family. She will follow-up care on an outpatient basis with her cardologist Dr. Mercie Eon, her psychiatric provider Johnney Ou and her [facility] out patient counselor Wende Bushy. Patient left unit with all persoanl belongings.   Discharge Diagnoses:  Principal Problem:  *Schizoaffective disorder, bipolar type   Discharged  Condition: General Appearance /Behavior: The patient is friendly and cooperative today.  Eye Contact: Good.  Speech: Appropriate in rate and volume.  Motor Behavior: Appropriate.  Level of Consciousness: Alert and oriented x 3.  Mental Status: Alert and oriented x 3.  Mood: Mildly Depressed.  Affect: Mildly Constricted.  Anxiety Level: Mildly Anxious.  Thought Process: Appropriate.  Thought Content: The patient denies any auditory or visual hallucinations or delusional thinking today.  Perception: WNL.  Judgment: Fair to Good.  Insight: Fair to Good.  Cognition: Alert and oriented today.      Discharge Medication List as of 03/31/2011  3:56 PM    START taking these medications   Details  celecoxib (CELEBREX) 200 MG capsule Take 1 capsule (200 mg total) by mouth 2 (two) times daily., Starting 03/31/2011, Until Wed 04/30/11, Print    hydrOXYzine (ATARAX/VISTARIL) 25 MG tablet Take 1 tablet (25 mg total) by mouth every 4 (four) hours as needed for anxiety., Starting 03/31/2011, Until Thu 04/10/11, Print    lithium carbonate (LITHOBID) 300 MG CR tablet Take 1 tablet (300 mg total) by mouth every 12 (twelve) hours., Starting 03/31/2011, Until Wed 04/30/11, Print      CONTINUE these medications which have CHANGED   Details  pantoprazole (PROTONIX) 20 MG tablet Take 1 tablet (20 mg total) by mouth daily., Starting 03/31/2011, Until Tue 03/30/12, Print    QUEtiapine (SEROQUEL) 400 MG tablet Take 2 tablets (800 mg total) by mouth at bedtime. 2 weeks supply samples, Starting  03/31/2011, Until Wed 04/30/11, Print      CONTINUE these medications which have NOT CHANGED   Details  zolpidem (AMBIEN) 10 MG tablet Take 10 mg by mouth at bedtime. For sleep, Until Discontinued, Historical Med      STOP taking these medications     ciprofloxacin (CIPRO) 500 MG tablet      oxyCODONE-acetaminophen (PERCOCET) 5-325 MG per tablet      fentaNYL (DURAGESIC - DOSED MCG/HR) 75 MCG/HR      gabapentin  (NEURONTIN) 300 MG capsule        Follow-up Information    Follow up with Thomos Lemons, DO. Call in 2 days. (for appointment/referral with cardiology)       Follow up with [facility]  (Dr. Johnney Ou) on 04/04/2011. (Appointment scheduled at 3:30 pm )    Contact information:   [address] [place], [place] [address] [phone]      Follow up with [facility] on 04/17/2011. (Appointment scheduled at 2:30 pm)    Contact information:   [address] [place], [place] [address] [phone]         Signed: Sanjuana Kava 03/31/2011, 4:06 PM

## 2011-03-31 NOTE — Progress Notes (Signed)
Suicide Risk Assessment  Discharge Assessment     Demographic factors:  Assessment Details Time of Assessment: Admission Information Obtained From: Patient Current Mental Status:  Current Mental Status: Suicidal ideation indicated by patient;Self-harm thoughts Risk Reduction Factors:  Risk Reduction Factors: Responsible for children under 45 years of age;Sense of responsibility to family;Religious beliefs about death;Living with another person, especially a relative;Positive social support;Positive therapeutic relationship  CLINICAL FACTORS:   Depression:   Anhedonia Insomnia Previous Psychiatric Diagnoses and Treatments Schizoaffective Disorder - Bipolar Type.  COGNITIVE FEATURES THAT CONTRIBUTE TO RISK:  None Noted.  Diagnosis:  Axis I:  Schizoaffective Disorder - Bipolar Type.  The patient was seen today and reports the following:   ADL's: Appropriate.  Sleep: The patient reports that she is sleeping well without difficulty.  Appetite: The patient reports a good appetite.   Mild>(1-10) >Severe  Hopelessness (1-10): 0.  Depression (1-10): 2-3  Anxiety (1-10): 2-3  Suicidal Ideation: The patient adamantly denies any suicidal ideations today.  Plan: No  Intent: No  Means: No   Homicidal Ideation: The patient denies any homicidal ideations today.  Plan: No  Intent: No.  Means: No   General Appearance /Behavior: The patient is friendly and cooperative today.  Eye Contact: Good.  Speech: Appropriate in rate and volume.  Motor Behavior: Appropriate.  Level of Consciousness: Alert and oriented x 3.  Mental Status: Alert and oriented x 3.  Mood: Mildly Depressed.  Affect: Mildly Constricted.  Anxiety Level: Mildly Anxious.  Thought Process: Appropriate.  Thought Content: The patient denies any auditory or visual hallucinations or delusional thinking today.  Perception: WNL.  Judgment: Fair to Good.  Insight: Fair to Good.  Cognition: Alert and oriented today.    Treatment Plan Summary:  1. Daily contact with patient to assess and evaluate symptoms and progress in treatment  2. Medication management  3. The patient will deny suicidal ideations or homicidal ideations for 48 hours prior to discharge and have a depression and anxiety rating of 3 or less. The patient will also deny any auditory or visual hallucinations or delusional thinking.   Plan:  1. Will continue current medications.  2. Will continue to monitor. 3. Discharge today to outpatient follow up.  SUICIDE RISK:   Minimal: No identifiable suicidal ideation.  Patients presenting with no risk factors but with morbid ruminations; may be classified as minimal risk based on the severity of the depressive symptoms   [name] 03/31/2011, 11:55 AM

## 2011-03-31 NOTE — Progress Notes (Addendum)
[facility] Case Management Discharge Plan:  Will you be returning to the same living situation after discharge: Yes,  return to own home Would you like a referral for services when you are discharged:Yes,  referral made for therapist Do you have access to transportation at discharge:Yes,  husband to transport pt home Do you have the ability to pay for your medications:Yes,  pt has access to meds  Interagency Information:   Release of information consent forms completed and in the chart; pt's signature needed at discharge.   Patient to Follow up at:  Follow-up Information    Follow up with Thomos Lemons, DO. Call in 2 days. (for appointment/referral with cardiology)       Follow up with [facility]  (Dr. Johnney Ou) on 04/04/2011. (Appointment scheduled at 3:30 pm )    Contact information:   [address] [place], [place] [address] [phone]      Follow up with [facility] on 04/17/2011. (Appointment scheduled at 2:30 pm)    Contact information:   [address] [place], [place] [address] [phone]         Patient denies SI/HI:   Yes,  pt denies SI/HI today.     Safety Planning and Suicide Prevention discussed:  Yes,  discussed with pt  Barrier to discharge identified:No.  Summary and Recommendations:No recommendations from SW.  No further needs voiced by pt.  Pt stable to discharge.    Per State Regulation 482.30 This Chart was reviewed for medical necessity with respect to the patient's Admission/Duration of stay.   [name]  03/31/2011  Next Review Date: 1//17/13  [name] 03/31/2011, 10:52 AM

## 2011-03-31 NOTE — Progress Notes (Signed)
Patient ID: [name], female   DOB: [birth year], 46 y.o.   MRN: [id number] Pt was discharged at 1430 to ride home with her husband.  She denies SI/HI.  She verbalizes understanding of d/c meds and followup.  She was given suicide prevention pamphlet and scripts by md.

## 2011-03-31 NOTE — Progress Notes (Signed)
[facility] Group Notes:  (Counselor/Nursing/MHT/Case Management/Adjunct) 11:00   Type of Therapy:  Group Therapy  Participation Level:  Did Not Attend       [name] 03/31/2011  12:58 PM

## 2011-03-31 NOTE — Tx Team (Signed)
Interdisciplinary Treatment Plan Update (Adult)  Date:  03/31/2011  Time Reviewed:  10:20 AM   Progress in Treatment: Attending groups: Yes Participating in groups:  Yes Taking medication as prescribed: Yes Tolerating medication:  Yes Family/Significant othe contact made:  Yes Patient understands diagnosis:  Yes Discussing patient identified problems/goals with staff:  Yes Medical problems stabilized or resolved:  Yes Denies suicidal/homicidal ideation: Yes Issues/concerns per patient self-inventory:  None identified Other: N/A  New problem(s) identified: None Identified  Reason for Continuation of Hospitalization: Stable to d/c  Interventions implemented related to continuation of hospitalization: Stable to d/c   Additional comments: N/A  Estimated length of stay: D/C today  Discharge Plan: Pt denied referral to [facility] for SA tx, pt will follow up with psychiatrist in [facility] and therapist at [facility]  New goal(s): N/A  Review of initial/current patient goals per problem list:    1.  Goal(s): Reduce depressive and anxiety symptoms  Met:  Yes  Target date: by discharge  As evidenced by: Reducing depression from a 10 to a 3 as reported by pt. Pt ranks depression at a 2 and anxiety at a 4 today  2.  Goal (s): Reduce/Eliminate suicidal ideation  Met:  Yes  Target date: by discharge  As evidenced by: pt reporting no SI.  Pt denies SI.   3.  Goal(s): Address substance use  Met:  Yes  Target date: by discharge  As evidenced by: Pt completed detox protocol and referred to NA meetings.     Attendees: Patient:  [name] 03/31/2011 10:50 AM   Family:     Physician:    03/31/2011  10:20 AM   Nursing:   Neill Loft, RN 03/31/2011 10:23 AM   Case Manager:  Reyes Ivan, LCSWA 03/31/2011  10:20 AM   Counselor:  Angus Palms, LCSW 03/31/2011  10:20 AM   Other:  Juline Patch, LCSW 03/31/2011  10:20 AM   Other:  Armandina Stammer, NP 03/31/2011 10:23 AM   Other:     Other:      Scribe for Treatment Team:   Carmina Miller, 03/31/2011 , 10:20 AM

## 2011-04-02 NOTE — Progress Notes (Signed)
Patient Discharge Instructions:  Admission Note Faxed,  04/01/2011 After Visit Summary Faxed,  04/01/2011 Faxed to the [facility] Care provider:  04/01/2011 D/C Summary faxed 04/01/2011 Facesheet faxed 04/01/2011   Faxed to [facility] - Shonna Chock @ [phone] And to [facility] - Dr. Wilma Flavin @ [phone]  Wandra Scot, 04/02/2011, 12:42 PM

## 2011-04-17 ENCOUNTER — Ambulatory Visit (HOSPITAL_COMMUNITY): Payer: Self-pay | Admitting: Psychology

## 2011-05-15 ENCOUNTER — Encounter: Payer: Self-pay | Admitting: Internal Medicine

## 2011-05-16 ENCOUNTER — Encounter: Payer: Self-pay | Admitting: Internal Medicine

## 2011-05-16 ENCOUNTER — Ambulatory Visit (INDEPENDENT_AMBULATORY_CARE_PROVIDER_SITE_OTHER): Payer: Medicare Other | Admitting: Internal Medicine

## 2011-05-16 DIAGNOSIS — M79673 Pain in unspecified foot: Secondary | ICD-10-CM

## 2011-05-16 DIAGNOSIS — Z1239 Encounter for other screening for malignant neoplasm of breast: Secondary | ICD-10-CM

## 2011-05-16 DIAGNOSIS — Z800 Family history of malignant neoplasm of digestive organs: Secondary | ICD-10-CM

## 2011-05-16 DIAGNOSIS — M79609 Pain in unspecified limb: Secondary | ICD-10-CM

## 2011-05-16 NOTE — Patient Instructions (Signed)
Please schedule your mammogram- the order has been placed.

## 2011-05-18 DIAGNOSIS — Z1239 Encounter for other screening for malignant neoplasm of breast: Secondary | ICD-10-CM | POA: Insufficient documentation

## 2011-05-18 DIAGNOSIS — Z800 Family history of malignant neoplasm of digestive organs: Secondary | ICD-10-CM | POA: Insufficient documentation

## 2011-05-18 DIAGNOSIS — M7741 Metatarsalgia, right foot: Secondary | ICD-10-CM | POA: Insufficient documentation

## 2011-05-18 NOTE — Assessment & Plan Note (Signed)
Schedule screening mammogram

## 2011-05-18 NOTE — Assessment & Plan Note (Signed)
Refer for colonoscopy 

## 2011-05-18 NOTE — Assessment & Plan Note (Signed)
Avoid narcotics. States taking motrin. Recommend minimize nsaids with lithium and pt to discuss with psychiatry

## 2011-05-18 NOTE — Progress Notes (Signed)
  Subjective:    Patient ID: [name], female    DOB: 09-Jan-1966, 46 y.o.   MRN: [id number]  HPI Pt presents to clinic for followup of multiple medical problems. Requests colonoscopy with father's hx of colon cancer. States she was recommended for q year f/u and last colonoscopy was several years ago. Needs screening mammogram. Sees gyn. H/o bipolar maintained on lithium followed by psychiatry who she states is monitoring levels. Has h/o chronic right foot pain s/p fracture previously treated with narcotics. Off narcotics successfully and attempting to avoid. Psychiatry prescribes ambien for insomnia. States chol and other screening labs utd and nl. Requests prn f/u.  Past Medical History  Diagnosis Date  . Bipolar 1 disorder   . Bipolar 1 disorder   . Dysrhythmia     Left sided heart diease  . Mental disorder   . Bipolar affective disorder, mixed, in partial or remission   . Headache   . Anxiety   . Renal disorder   . Kidney stones   . Depression    Past Surgical History  Procedure Date  . Vaginal hysterectomy   . Cholecystectomy   . Abdominal hysterectomy   . Foot surgery     reports that she has never smoked. She has never used smokeless tobacco. She reports that she uses illicit drugs (Other-see comments, Fentanyl, and Hydrocodone). She reports that she does not drink alcohol. family history is not on file. No Known Allergies    Review of Systems see hpi     Objective:   Physical Exam  Physical Exam  Nursing note and vitals reviewed. Constitutional: Appears well-developed and well-nourished. No distress.  HENT:  Head: Normocephalic and atraumatic.  Right Ear: External ear normal.  Left Ear: External ear normal.  Eyes: Conjunctivae are normal. No scleral icterus.  Neck: Neck supple. Carotid bruit is not present.  Cardiovascular: Normal rate, regular rhythm and normal heart sounds.  Exam reveals no gallop and no friction rub.   No murmur heard. Pulmonary/Chest:  Effort normal and breath sounds normal. No respiratory distress. He has no wheezes. no rales.  Lymphadenopathy:    He has no cervical adenopathy.  Neurological:Alert.  Skin: Skin is warm and dry. Not diaphoretic.  Psychiatric: Has a normal mood and affect.        Assessment & Plan:

## 2011-05-19 ENCOUNTER — Encounter: Payer: Self-pay | Admitting: Gastroenterology

## 2011-06-05 ENCOUNTER — Encounter: Payer: Self-pay | Admitting: Internal Medicine

## 2011-06-05 ENCOUNTER — Ambulatory Visit (INDEPENDENT_AMBULATORY_CARE_PROVIDER_SITE_OTHER): Payer: Medicare Other | Admitting: Internal Medicine

## 2011-06-05 VITALS — BP 116/60 | HR 100 | Temp 97.9°F | Resp 18 | Wt 182.0 lb

## 2011-06-05 DIAGNOSIS — R945 Abnormal results of liver function studies: Secondary | ICD-10-CM

## 2011-06-05 DIAGNOSIS — Z79899 Other long term (current) drug therapy: Secondary | ICD-10-CM

## 2011-06-05 DIAGNOSIS — D696 Thrombocytopenia, unspecified: Secondary | ICD-10-CM

## 2011-06-05 DIAGNOSIS — K769 Liver disease, unspecified: Secondary | ICD-10-CM

## 2011-06-05 NOTE — Progress Notes (Signed)
  Subjective:    Patient ID: [name], female    DOB: 1965/09/26, 46 y.o.   MRN: [id number]  HPI Pt presents to clinic for hospital follow up of splenomegaly. Recently admitted to [place] hospital with N/V. Records unavailable. During evaluation was told abd CT showed enlarged lymph nodes and splenomegaly. CBC significant for leukopenia and thrombocytopenia. No recent unusual infections and no gross active bleeding. Seen by H/O during hospitalization and was recommended for outpt f/u with H/O after discharge. Denies f/c, sweats, change in wt or adenopathy. N/V resolved. No other complaint.  Past Medical History  Diagnosis Date  . Bipolar 1 disorder   . Bipolar 1 disorder   . Dysrhythmia     Left sided heart diease  . Mental disorder   . Bipolar affective disorder, mixed, in partial or remission   . Headache   . Anxiety   . Renal disorder   . Kidney stones   . Depression    Past Surgical History  Procedure Date  . Vaginal hysterectomy   . Cholecystectomy   . Abdominal hysterectomy   . Foot surgery     reports that she has never smoked. She has never used smokeless tobacco. She reports that she uses illicit drugs (Other-see comments, Fentanyl, and Hydrocodone). She reports that she does not drink alcohol. family history is not on file. No Known Allergies   Review of Systems see hpi     Objective:   Physical Exam  Nursing note and vitals reviewed. Constitutional: She appears well-developed and well-nourished. No distress.  HENT:  Head: Normocephalic and atraumatic.  Eyes: Conjunctivae are normal. No scleral icterus.  Neck: Neck supple.  Cardiovascular: Normal rate, regular rhythm and normal heart sounds.  Exam reveals no gallop and no friction rub.   No murmur heard. Pulmonary/Chest: Effort normal and breath sounds normal. No respiratory distress. She has no wheezes. She has no rales.  Abdominal: Soft. Bowel sounds are normal. She exhibits no distension and no mass.  There is no hepatosplenomegaly. There is no tenderness. There is no rebound.  Lymphadenopathy:    She has no cervical adenopathy.  Skin: She is not diaphoretic.          Assessment & Plan:

## 2011-06-06 LAB — CBC WITH DIFFERENTIAL/PLATELET
Eosinophils Absolute: 0 10*3/uL (ref 0.0–0.7)
Lymphocytes Relative: 36 % (ref 12–46)
Lymphs Abs: 1.2 10*3/uL (ref 0.7–4.0)
MCH: 29.1 pg (ref 26.0–34.0)
Neutro Abs: 2 10*3/uL (ref 1.7–7.7)
Neutrophils Relative %: 58 % (ref 43–77)
Platelets: 143 10*3/uL — ABNORMAL LOW (ref 150–400)
RBC: 4.13 MIL/uL (ref 3.87–5.11)
WBC: 3.4 10*3/uL — ABNORMAL LOW (ref 4.0–10.5)

## 2011-06-06 LAB — HEPATIC FUNCTION PANEL
AST: 14 U/L (ref 0–37)
Alkaline Phosphatase: 71 U/L (ref 39–117)
Indirect Bilirubin: 0.4 mg/dL (ref 0.0–0.9)
Total Bilirubin: 0.5 mg/dL (ref 0.3–1.2)
Total Protein: 6.2 g/dL (ref 6.0–8.3)

## 2011-06-06 LAB — BASIC METABOLIC PANEL
CO2: 31 mEq/L (ref 19–32)
Calcium: 9.3 mg/dL (ref 8.4–10.5)
Potassium: 3.9 mEq/L (ref 3.5–5.3)
Sodium: 143 mEq/L (ref 135–145)

## 2011-06-08 DIAGNOSIS — D696 Thrombocytopenia, unspecified: Secondary | ICD-10-CM | POA: Insufficient documentation

## 2011-06-08 NOTE — Assessment & Plan Note (Signed)
Incidental finding with associated splenomegaly and leukopenia. Asx. Proceed wit H/O consult

## 2011-06-09 ENCOUNTER — Telehealth: Payer: Self-pay | Admitting: Hematology & Oncology

## 2011-06-09 NOTE — Telephone Encounter (Signed)
Pt aware of 3-27 appointment and that she will have to wait awhile between lab and md appointments

## 2011-06-11 ENCOUNTER — Other Ambulatory Visit (HOSPITAL_BASED_OUTPATIENT_CLINIC_OR_DEPARTMENT_OTHER): Payer: Medicare Other | Admitting: Lab

## 2011-06-11 ENCOUNTER — Ambulatory Visit: Payer: Medicare Other

## 2011-06-11 ENCOUNTER — Ambulatory Visit (HOSPITAL_BASED_OUTPATIENT_CLINIC_OR_DEPARTMENT_OTHER): Payer: Medicare Other | Admitting: Hematology & Oncology

## 2011-06-11 VITALS — BP 114/74 | HR 95 | Temp 97.8°F | Ht 66.0 in | Wt 184.0 lb

## 2011-06-11 DIAGNOSIS — F319 Bipolar disorder, unspecified: Secondary | ICD-10-CM

## 2011-06-11 DIAGNOSIS — D696 Thrombocytopenia, unspecified: Secondary | ICD-10-CM

## 2011-06-11 DIAGNOSIS — R161 Splenomegaly, not elsewhere classified: Secondary | ICD-10-CM

## 2011-06-11 DIAGNOSIS — D72819 Decreased white blood cell count, unspecified: Secondary | ICD-10-CM

## 2011-06-11 LAB — CBC WITH DIFFERENTIAL (CANCER CENTER ONLY)
BASO#: 0 10*3/uL (ref 0.0–0.2)
EOS%: 0 % (ref 0.0–7.0)
HCT: 35.1 % (ref 34.8–46.6)
HGB: 12.3 g/dL (ref 11.6–15.9)
MCH: 30.1 pg (ref 26.0–34.0)
MCHC: 35 g/dL (ref 32.0–36.0)
MONO%: 4.5 % (ref 0.0–13.0)
NEUT%: 70.4 % (ref 39.6–80.0)

## 2011-06-11 NOTE — Progress Notes (Signed)
CC:   [name] Arbour, MD  DIAGNOSES: 1. Transient leukopenia pancytopenia-likely secondary to kidney     infection. 2. Splenomegaly.  HISTORY OF PRESENT ILLNESS:  [name] is a very nice 46 year old white female.  She is followed by Dr. Rodena Medin at [facility] and our office.  She does have issues with bipolar disorder.  She was up in [place], [place], a few weeks ago.  She apparently was admitted to a local hospital up there because of what appeared to be a kidney infection.  She has had problems with kidney and bladder infections in the past.  From what she was told, she had low white cells, low platelets, and her spleen was big.  Unfortunately, I do not have any records from that hospitalization.  She said that they did a CT scan which showed her spleen to be big.  She was seen by Hematology up there.  It did not sound like anything was done.  They recommended that she be seen as an outpatient by Hematology locally.  As such, Dr. Rodena Medin kindly referred her to the [facility].  She feels okay right now.  She is not having any abdominal pain.  She does have some issues with constipation.  She is going for an upper and lower endoscopy in a few weeks.  There is a history of colon cancer with her father.  Also, her father had female breast cancer.  Her mother had female breast cancer.  Lab work that was done back in July basically showed normal blood counts.  However, blood work done on 03/21 show white count of 3.4, hemoglobin 12, and platelet count was 143.  She has had no rashes.  She has had no bleeding or bruising.  She has had some nausea but no vomiting.  She said that when she does get these infections, she does have some nausea and vomiting.  She says she has lost weight.  She has lost 40 pounds over the past 6 months.  She said that she is not trying to lose weight.  Again, we were asked to see her because of this hematologic  abnormality that was encountered while she was hospitalized up in [place].  PAST MEDICAL HISTORY:  Remarkable for: 1. Bipolar disorder. 2. Hysterectomy secondary to prolapse. 3. Cholecystectomy. 4. Nephrolithiasis. 5. Anxiety. 6. Headaches.  ALLERGIES:  None.  MEDICATIONS: 1. Celebrex 200 mg p.o. b.i.d. 2. Vistaril 25 mg p.o. q.h.s. 3. Lithium 300 mg p.o. b.i.d. 4. Seroquel 400 mg p.o. q.h.s. 5. Trazodone 50 mg p.o. q.h.s. 6. Ambien 10 mg p.o. q.h.s.  SOCIAL HISTORY:  Negative for tobacco use.  There is no alcohol use.  FAMILY HISTORY:  Remarkable for her mother who had breast cancer and uterine cancer.  She currently has metastatic uterine cancer.  Her father had colon cancer and female breast cancer.  [name] has had 6 children. Her children are all healthy.  REVIEW OF SYSTEMS:  Is as stated in history present illness.  No additional findings are noted on a 12-system review.  PHYSICAL EXAM:  General: This is a well-developed, well-nourished, white female in no obvious distress.  Vital signs: Show a temperature of 97.8, pulse 95, respiratory 18, blood pressure 114/74, and weight is 184. Head and neck exam shows a normocephalic, atraumatic skull.  She has no ocular or oral lesions.  There is no scleral icterus.  There is no mucositis.  There is no adenopathy in the neck.  Thyroid is nonpalpable.  She has no supraclavicular lymph nodes.  Lungs are clear bilaterally. Cardiac examination:  Regular rate and rhythm with a normal S1 and S2. She has a 1/6 systolic ejection murmur.  Axillary exam: Shows no bilateral axillary adenopathy.  Abdominal exam: Soft with good bowel sounds.  There is no fluid wave.  There is no palpable abdominal mass. There is no guarding or rebound tenderness.  There is no palpable hepatomegaly.  I cannot feel her spleen, even with deep inspiration. Back exam:  No tenderness over the spine, ribs, or hips.  Extremities: Shows no clubbing, cyanosis, or  edema.  She has good range motion of her joints.  There is no joint swelling, erythema, or warmth.  Neurological exam: Shows no focal neurological deficits.  Skin exam: No rashes, ecchymosis, or petechia.  LABORATORY STUDIES:  White cell count is 6.2, hemoglobin 12.3, hematocrit 35.1, and platelet count 185.  MCV is 86.  White cell differential shows 70 segs, 25 lymphs, and 4 monos.  Peripheral smear shows a normochromic, normocytic population of red blood cells.  There are no nucleated red blood cells.  I see no teardrop cells.  There is no Rouleaux formation.  She has no target cells.  There are no schistocytes.  White cells appear normal in morphology and maturation. I do not see any immature myeloid cells.  There are no hypersegmented polys.  There is no blasts.  I see no atypical lymphocytes.  There are no large lymphocytes.  Platelets are adequate in number and size.  IMPRESSION:  [name] is a very nice 46 year old white female with transient leukopenia/thrombocytopenia.  Again, one would have to suspect that this may have been reflective of this kidney infection that she had.  It may have been also secondary to antibiotics that she could have been on.  I do not find any evidence of splenomegaly.  If she does have splenomegaly, it is minimal at best.  Even when I had her on her right side, I could not feel her spleen with inspiration.  Her blood smear is totally benign.  I do not see any abnormalities with her red cells or white cells that would suggest an underlying marrow disorder.  I think the most important thing for [name] is her not having a mammogram ever.  Given her father and mother's history of breast cancer, she really needs to have a mammogram done.  I expressed this to her and she said that she will get this taken care.  Again, I do not see any indication for any hematologic disorder.  Again, I have to suspect that these transient changes in her blood  counts were a reflection of her having this urinary tract infection and possibly even sepsis.  I do, however, want her to follow up in about 4 months with an ultrasound of the spleen.  I want to also get a repeat CBC on her.  I just want to make sure that we do not overlook any potential problem. However, I feel fairly confident that we are not going to have any hematologic abnormality to treat.  [name] came in with her husband. They are both very, very nice. She certainly looks great for 46 years old and having 6 kids.  I spent a good hour with her today.    ______________________________ Josph Macho, M.D. PRE/MEDQ  D:  06/11/2011  T:  06/11/2011  Job:  [id number]

## 2011-06-11 NOTE — Progress Notes (Signed)
This office note has been dictated.

## 2011-06-12 ENCOUNTER — Telehealth: Payer: Self-pay | Admitting: Hematology & Oncology

## 2011-06-12 NOTE — Telephone Encounter (Signed)
Mailed 10-16-11 schedule for [place] and MD appointments. Sent instruction sheet for [place] to be NPO 6 hrs.

## 2011-06-13 ENCOUNTER — Telehealth: Payer: Self-pay | Admitting: *Deleted

## 2011-06-13 LAB — COMPREHENSIVE METABOLIC PANEL
AST: 14 U/L (ref 0–37)
Albumin: 4.5 g/dL (ref 3.5–5.2)
BUN: 12 mg/dL (ref 6–23)
Calcium: 9.1 mg/dL (ref 8.4–10.5)
Chloride: 104 mEq/L (ref 96–112)
Glucose, Bld: 108 mg/dL — ABNORMAL HIGH (ref 70–99)
Potassium: 3.6 mEq/L (ref 3.5–5.3)

## 2011-06-13 LAB — RHEUMATOID FACTOR: Rhuematoid fact SerPl-aCnc: <10 IU/mL (ref <=14)

## 2011-06-13 LAB — PROTEIN ELECTROPHORESIS, SERUM, WITH REFLEX
Alpha-2-Globulin: 9.8 % (ref 7.1–11.8)
Beta 2: 4.5 % (ref 3.2–6.5)
Beta Globulin: 6.2 % (ref 4.7–7.2)
Gamma Globulin: 10.9 % — ABNORMAL LOW (ref 11.1–18.8)

## 2011-06-13 NOTE — Telephone Encounter (Addendum)
Message copied by Mirian Capuchin on Fri Jun 13, 2011 10:08 AM ------      Message from: Arlan Organ R      Created: Thu Jun 12, 2011  9:03 PM       Call:  Labs are ok!! I do not see any obvious blood problem!!!  Pete  Left this message on pt's home answering machine

## 2011-06-23 ENCOUNTER — Ambulatory Visit (AMBULATORY_SURGERY_CENTER): Payer: Medicare Other | Admitting: *Deleted

## 2011-06-23 VITALS — Ht 66.0 in | Wt 188.2 lb

## 2011-06-23 DIAGNOSIS — Z1211 Encounter for screening for malignant neoplasm of colon: Secondary | ICD-10-CM

## 2011-06-23 NOTE — Progress Notes (Signed)
Pt here today for PV for colonoscopy with Dr. Christella Hartigan scheduled 07/01/2011. Pt is new to [facility].  Pt says that she has had 2 colonoscopy procedures; 1st in 2001; 2nd colonoscopy in 2008 in [facility].  She does not remember who the doctors were that performed procedures or where they were done.  She says her father has hx of colon cancer diagnosed in his early 40's.  During PV interview pt says that she is having very painful bowel movements.  No rectal bleeding. She says that she has to use fleet enemas weekly to have BM.  Pt admits to having substance abuse problem; using Fentanyl and Percocet.  Last use was in January of this year.  Colonoscopy scheduled for 4/16 cancelled and office visit scheduled with Dr. Christella Hartigan on 4/30. Ezra Sites

## 2011-07-01 ENCOUNTER — Other Ambulatory Visit: Payer: Self-pay | Admitting: Gastroenterology

## 2011-07-03 ENCOUNTER — Encounter: Payer: Self-pay | Admitting: Internal Medicine

## 2011-07-03 ENCOUNTER — Ambulatory Visit (INDEPENDENT_AMBULATORY_CARE_PROVIDER_SITE_OTHER): Payer: Medicare Other | Admitting: Internal Medicine

## 2011-07-03 ENCOUNTER — Ambulatory Visit (HOSPITAL_BASED_OUTPATIENT_CLINIC_OR_DEPARTMENT_OTHER)
Admission: RE | Admit: 2011-07-03 | Discharge: 2011-07-03 | Disposition: A | Discharge status: Home / Self Care | Payer: Medicare Other | Source: Ambulatory Visit | Attending: Internal Medicine | Admitting: Internal Medicine

## 2011-07-03 VITALS — BP 102/78 | HR 100 | Temp 98.2°F | Resp 16 | Wt 185.1 lb

## 2011-07-03 DIAGNOSIS — M79646 Pain in unspecified finger(s): Secondary | ICD-10-CM

## 2011-07-03 DIAGNOSIS — M549 Dorsalgia, unspecified: Secondary | ICD-10-CM

## 2011-07-03 DIAGNOSIS — M79609 Pain in unspecified limb: Secondary | ICD-10-CM

## 2011-07-03 DIAGNOSIS — W19XXXA Unspecified fall, initial encounter: Secondary | ICD-10-CM

## 2011-07-03 MED ORDER — HYDROCODONE-ACETAMINOPHEN 5-500 MG PO TABS: 1.0000 | ORAL_TABLET | Freq: Three times a day (TID) | ORAL | Status: AC | PRN

## 2011-07-03 MED ORDER — CYCLOBENZAPRINE HCL 10 MG PO TABS: 10.0000 mg | ORAL_TABLET | Freq: Three times a day (TID) | ORAL | Status: AC | PRN

## 2011-07-10 DIAGNOSIS — M549 Dorsalgia, unspecified: Secondary | ICD-10-CM | POA: Insufficient documentation

## 2011-07-10 DIAGNOSIS — M79646 Pain in unspecified finger(s): Secondary | ICD-10-CM | POA: Insufficient documentation

## 2011-07-10 NOTE — Assessment & Plan Note (Signed)
Obtain plain xray of finger. Discussed buddy taping.

## 2011-07-10 NOTE — Assessment & Plan Note (Signed)
Obtain plain xrays of thoracic and lumbar back. Attempt flexeril prn. Provided limited amount of lortab to be used sparingly. Discussed past narcotic use and that this prescription would not be refilled.

## 2011-07-10 NOTE — Progress Notes (Signed)
  Subjective:    Patient ID: [name], female    DOB: [birth year], 45 y.o.   MRN: [id number]  HPI Pt presents to clinic for evaluation of back pain. States accidentally fell off treadmill while exercising. No LOC or head injury. Notes lumbar and thoracic back pain without radiation, leg weakness or paresthesia. +left 4th finger pain and st swelling with FROM intact. Reviewed past difficulty with narcotic use. Pt states occurred only during times when her bipolar was not well controlled. Notes good control currently. No other alleviating or exacerbating factors.  Past Medical History  Diagnosis Date  . Bipolar 1 disorder   . Bipolar 1 disorder   . Dysrhythmia     Left sided heart diease  . Mental disorder   . Bipolar affective disorder, mixed, in partial or remission   . Headache   . Anxiety   . Renal disorder   . Kidney stones   . Depression   . Substance abuse     Fentanyl, Percocet. last use 04/2011   Past Surgical History  Procedure Date  . Vaginal hysterectomy   . Cholecystectomy   . Abdominal hysterectomy   . Foot surgery   . Colonoscopy     2001, 2008.  Father has colon cancer    reports that she has never smoked. She has never used smokeless tobacco. She reports that she uses illicit drugs (Other-see comments, Fentanyl, and Hydrocodone). She reports that she does not drink alcohol. family history includes Colon cancer (age of onset:52) in her father.  There is no history of Stomach cancer, and Rectal cancer, and Esophageal cancer, . No Known Allergies   Review of Systems see hpi     Objective:   Physical Exam  Nursing note and vitals reviewed. Constitutional: She appears well-developed and well-nourished. No distress.  HENT:  Head: Normocephalic and atraumatic.  Eyes: Conjunctivae are normal. No scleral icterus.  Musculoskeletal:       Left 4th finger with ST swellling. FROM. Sensation intact. +tenderness. No bony abn.  Thoracic and ls spine demonstrate no  midline tenderness or bony abn. +right lumbar paraspinal muscle tenderness and spasm. FROM bilateral legs. Gait nl. Le strength 5/5.  Neurological: She is alert.  Skin: Skin is warm and dry. She is not diaphoretic.  Psychiatric: She has a normal mood and affect. Her behavior is normal.          Assessment & Plan:

## 2011-07-15 ENCOUNTER — Ambulatory Visit: Payer: Self-pay | Admitting: Gastroenterology

## 2011-07-18 ENCOUNTER — Telehealth: Payer: Self-pay | Admitting: Gastroenterology

## 2011-07-18 NOTE — Telephone Encounter (Signed)
Message copied by Arna Snipe on Fri Jul 18, 2011  9:46 AM ------      Message from: Donata Duff      Created: Tue Jul 15, 2011  2:29 PM       Do not bill

## 2011-08-08 ENCOUNTER — Encounter (HOSPITAL_BASED_OUTPATIENT_CLINIC_OR_DEPARTMENT_OTHER): Payer: Self-pay | Admitting: *Deleted

## 2011-08-08 ENCOUNTER — Emergency Department (HOSPITAL_BASED_OUTPATIENT_CLINIC_OR_DEPARTMENT_OTHER)
Admission: EM | Admit: 2011-08-08 | Discharge: 2011-08-09 | Disposition: A | Discharge status: Home / Self Care | Payer: Medicare Other | Attending: Emergency Medicine | Admitting: Emergency Medicine

## 2011-08-08 DIAGNOSIS — N390 Urinary tract infection, site not specified: Secondary | ICD-10-CM

## 2011-08-08 DIAGNOSIS — Z9889 Other specified postprocedural states: Secondary | ICD-10-CM | POA: Insufficient documentation

## 2011-08-08 DIAGNOSIS — R109 Unspecified abdominal pain: Secondary | ICD-10-CM | POA: Insufficient documentation

## 2011-08-08 DIAGNOSIS — R112 Nausea with vomiting, unspecified: Secondary | ICD-10-CM | POA: Insufficient documentation

## 2011-08-08 DIAGNOSIS — F319 Bipolar disorder, unspecified: Secondary | ICD-10-CM | POA: Insufficient documentation

## 2011-08-08 DIAGNOSIS — Z79899 Other long term (current) drug therapy: Secondary | ICD-10-CM | POA: Insufficient documentation

## 2011-08-08 DIAGNOSIS — M25579 Pain in unspecified ankle and joints of unspecified foot: Secondary | ICD-10-CM | POA: Insufficient documentation

## 2011-08-08 LAB — URINALYSIS, ROUTINE W REFLEX MICROSCOPIC
Bilirubin Urine: NEGATIVE
Hgb urine dipstick: NEGATIVE
Ketones, ur: NEGATIVE mg/dL
Nitrite: NEGATIVE
Protein, ur: NEGATIVE mg/dL
Urobilinogen, UA: 0.2 mg/dL (ref 0.0–1.0)

## 2011-08-08 LAB — DIFFERENTIAL
Eosinophils Absolute: 0 10*3/uL (ref 0.0–0.7)
Lymphs Abs: 1.7 10*3/uL (ref 0.7–4.0)
Monocytes Absolute: 0.3 10*3/uL (ref 0.1–1.0)
Monocytes Relative: 6 % (ref 3–12)
Neutrophils Relative %: 53 % (ref 43–77)

## 2011-08-08 LAB — CBC
HCT: 36.1 % (ref 36.0–46.0)
Hemoglobin: 12.7 g/dL (ref 12.0–15.0)
MCH: 30.3 pg (ref 26.0–34.0)
MCHC: 35.2 g/dL (ref 30.0–36.0)
MCV: 86.2 fL (ref 78.0–100.0)
RBC: 4.19 MIL/uL (ref 3.87–5.11)

## 2011-08-08 LAB — URINE MICROSCOPIC-ADD ON

## 2011-08-08 MED ORDER — ONDANSETRON HCL 4 MG/2ML IJ SOLN
4.0000 mg | Freq: Once | INTRAMUSCULAR | Status: AC
  Administered 2011-08-08: 4 mg via INTRAVENOUS
  Filled 2011-08-08: qty 2

## 2011-08-08 MED ORDER — FENTANYL CITRATE 0.05 MG/ML IJ SOLN
100.0000 ug | Freq: Once | INTRAMUSCULAR | Status: AC
  Administered 2011-08-08: 100 ug via INTRAVENOUS
  Filled 2011-08-08: qty 2

## 2011-08-08 MED ORDER — SODIUM CHLORIDE 0.9 % IV BOLUS (SEPSIS)
1000.0000 mL | Freq: Once | INTRAVENOUS | Status: AC
  Administered 2011-08-08: 1000 mL via INTRAVENOUS

## 2011-08-08 NOTE — ED Provider Notes (Signed)
History     CSN: [id number]  Arrival date & time 08/08/11  2025  CC: Right flank pain and increased urinary frequency.  HPI  Patient is a 46 year old lady with a past medical history of migraine headache, bipolar disorder and recurrent UTI, who presents with right flank pain and increased urinary frequency.  Patient reports that she has been having recurrent urinary tract infection in the past. She had been admitted to the hospital for the treatment for UTI in the past. Last admission was on March. She was treated with IV antibiotics for 3 days in [facility]. In that admission, the CT-abdomen was performed, which did not show any kidney stone per patient. After that, she has been doing fine until 2 weeks ago, when she started having right flank pain and increased urinary frequency. But she denies any burning sensation or dysuria when she urinates. Her symptoms is associated with nausea. The patient vomited 2 or 3 times of food materials without blood in it.   She denies fever or chills, abdominal pain and diarrhea. Denies cough, chest pain and SOB   Past Medical History  Diagnosis Date  . Bipolar 1 disorder   . Bipolar 1 disorder   . Dysrhythmia     Left sided heart diease  . Mental disorder   . Bipolar affective disorder, mixed, in partial or remission   . Headache   . Anxiety   . Renal disorder   . Kidney stones   . Depression   . Substance abuse     Fentanyl, Percocet. last use 04/2011    Past Surgical History  Procedure Date  . Vaginal hysterectomy   . Cholecystectomy   . Abdominal hysterectomy   . Foot surgery   . Colonoscopy     2001, 2008.  Father has colon cancer    Family History  Problem Relation Age of Onset  . Colon cancer Father 52  . Stomach cancer Neg Hx   . Rectal cancer Neg Hx   . Esophageal cancer Neg Hx     History  Substance Use Topics  . Smoking status: Never Smoker   . Smokeless tobacco: Never Used  . Alcohol Use: No    OB  History    Grav Para Term Preterm Abortions TAB SAB Ect Mult Living                  Review of Systems  Constitutional: Negative for fever, chills, diaphoresis, activity change, appetite change and fatigue.  HENT: Negative for hearing loss, ear pain, nosebleeds, congestion, sore throat, facial swelling, rhinorrhea, sneezing, mouth sores, trouble swallowing, neck pain, neck stiffness, dental problem, voice change, postnasal drip and ear discharge.   Eyes: Negative for photophobia, pain, discharge, redness, itching and visual disturbance.  Respiratory: Negative for apnea, cough, choking, chest tightness, shortness of breath, wheezing and stridor.   Cardiovascular: Negative for chest pain, palpitations and leg swelling.  Gastrointestinal: Positive for nausea and vomiting. Negative for abdominal pain, diarrhea, constipation, blood in stool, abdominal distention and anal bleeding.  Genitourinary: Positive for urgency, frequency and flank pain. Negative for hematuria, decreased urine volume, enuresis, difficulty urinating, genital sores, vaginal pain, pelvic pain and dyspareunia.       Patient has right flank pain.  Musculoskeletal: Negative for back pain, joint swelling and gait problem.       Patient has pain in right ankle. She is wearing cast due to ankle surgery.  Skin: Negative for color change, pallor and rash.  Neurological: Negative for dizziness, tremors, seizures, syncope, facial asymmetry, speech difficulty, weakness, light-headedness, numbness and headaches.  Psychiatric/Behavioral: Positive for decreased concentration. Negative for suicidal ideas, hallucinations, behavioral problems, confusion, self-injury and agitation. The patient is not hyperactive.    Allergies  Review of patient's allergies indicates no known allergies.  Home Medications   Current Outpatient Rx  Name Route Sig Dispense Refill  . HYDROCODONE-ACETAMINOPHEN 5-325 MG PO TABS Oral Take 1 tablet by mouth every 6  (six) hours as needed. Patient is using this medication for her foot pain.    Marland Kitchen HYDROXYZINE PAMOATE 25 MG PO CAPS Oral Take 25 mg by mouth at bedtime.    Marland Kitchen LITHIUM CARBONATE 300 MG PO TABS Oral Take 300 mg by mouth 2 (two) times daily.    Marland Kitchen METHOCARBAMOL 500 MG PO TABS Oral Take 500 mg by mouth 4 (four) times daily. Patient is using this medication for her foot pain.    Marland Kitchen QUETIAPINE FUMARATE 400 MG PO TABS Oral Take 400 mg by mouth at bedtime. 2 at bedtime    . TRAZODONE HCL 300 MG PO TABS Oral Take 300 mg by mouth at bedtime.    Marland Kitchen ZOLPIDEM TARTRATE 10 MG PO TABS Oral Take 10 mg by mouth at bedtime. For sleep    . LITHIUM CARBONATE ER 300 MG PO TBCR Oral Take 1 tablet (300 mg total) by mouth every 12 (twelve) hours. 60 tablet 0    Mood stabilizer  . QUETIAPINE FUMARATE 400 MG PO TABS Oral Take 2 tablets (800 mg total) by mouth at bedtime. 2 weeks supply samples 60 tablet 0    For mood.    BP 102/76  Pulse 93  Temp(Src) 98 F (36.7 C) (Oral)  Resp 20  SpO2 99%  Physical Exam  Constitutional: Vital signs reviewed.  Patient is a well-developed and well-nourished. Not in acute distress and cooperative with exam.  Head: Normocephalic and atraumatic Ear: TM normal bilaterally Mouth: no erythema or exudates, MMM Eyes: PERRL, EOMI, conjunctivae normal, No scleral icterus.  Neck: Supple, Trachea midline normal ROM, No JVD, mass, thyromegaly, or carotid bruit present.  Cardiovascular: RRR, S1 normal, S2 normal, no MRG, pulses symmetric and intact bilaterally Pulmonary/Chest: CTAB, no wheezes, rales, or rhonchi Abdominal: Soft. Non-tender, non-distended, bowel sounds are normal, no masses, organomegaly, or guarding present.  GU:  CVA tenderness on right side Musculoskeletal: No joint deformities, erythema, or stiffness. Patient is wearing a cast on her right ankle due to surgery secondary to ankle injury. Hematology: no cervical, inginal, or axillary adenopathy.  Neurological: A&O x3, Strenght  is normal and symmetric bilaterally, cranial nerve II-XII are grossly intact, no focal motor deficit, sensory intact to light touch bilaterally.  Skin: Warm, dry and intact. No rash, cyanosis, or clubbing.  Psychiatric: Normal mood and affect. speech and behavior is normal. Judgment and thought content normal. Cognition and memory are normal.   ED Course  Procedures (including critical care time)  Patient vital signs are stable in ED. She doesn't have a fever and leukocytosis. Her urinalysis shows moderate leukocytes and 7-10 WBC/HPF, hemoglobin negative, no ketone. Patient is treated with Zofran for nausea and fentanyl for pain in ED.  Patient was also given IV normal saline 1000 cc in ED. After treatment, her nausea and flank pain are minimal.  Other lab tests: BMP, lactic acid, urine culture, CBC with differential.   Labs Reviewed  URINALYSIS, ROUTINE W REFLEX MICROSCOPIC - Abnormal; Notable for the following:    APPearance CLOUDY (*)  Specific Gravity, Urine 1.031 (*)    Leukocytes, UA MODERATE (*)    All other components within normal limits  URINE MICROSCOPIC-ADD ON - Abnormal; Notable for the following:    Squamous Epithelial / LPF FEW (*)    All other components within normal limits  CBC  DIFFERENTIAL  BASIC METABOLIC PANEL  LACTIC ACID, PLASMA  URINE CULTURE   No results found.   No diagnosis found.    MDM  Patient's right flank pain and increased urinary frequency are most likely caused by UTI. Her urinalysis results is consistent with UTI. Currently patient doesn't have fever. CBC did not show leukocytosis. Her lactic acid is normal. Her electrolyte is also normal. Patient will be discharged to home on 7 days of ciprofloxacin for UTI treatment. She is instructed to followup with her PCP in 2 weeks.        Lorretta Harp, MD 08/09/11 [phone]

## 2011-08-08 NOTE — ED Notes (Signed)
Back pain and nausea. Thinks she has a UTI.

## 2011-08-09 LAB — BASIC METABOLIC PANEL WITH GFR
BUN: 19 mg/dL (ref 6–23)
CO2: 26 meq/L (ref 19–32)
Calcium: 9.3 mg/dL (ref 8.4–10.5)
Chloride: 104 meq/L (ref 96–112)
Creatinine, Ser: 1 mg/dL (ref 0.50–1.10)
GFR calc Af Amer: 78 mL/min — ABNORMAL LOW
GFR calc non Af Amer: 67 mL/min — ABNORMAL LOW
Glucose, Bld: 115 mg/dL — ABNORMAL HIGH (ref 70–99)
Potassium: 4.2 meq/L (ref 3.5–5.1)
Sodium: 138 meq/L (ref 135–145)

## 2011-08-09 LAB — LACTIC ACID, PLASMA: Lactic Acid, Venous: 1.2 mmol/L (ref 0.5–2.2)

## 2011-08-09 MED ORDER — PROMETHAZINE HCL 25 MG/ML IJ SOLN
25.0000 mg | Freq: Once | INTRAMUSCULAR | Status: AC
  Administered 2011-08-09: 25 mg via INTRAVENOUS
  Filled 2011-08-09: qty 1

## 2011-08-09 MED ORDER — CIPROFLOXACIN HCL 500 MG PO TABS: 500.0000 mg | ORAL_TABLET | Freq: Two times a day (BID) | ORAL | Status: AC

## 2011-08-09 MED ORDER — HYDROMORPHONE HCL PF 1 MG/ML IJ SOLN
1.0000 mg | Freq: Once | INTRAMUSCULAR | Status: AC
  Administered 2011-08-09: 1 mg via INTRAVENOUS
  Filled 2011-08-09: qty 1

## 2011-08-09 NOTE — Discharge Instructions (Signed)
1. Your symptoms are most likely caused by urinary tract infection. Please take the ciprofloxacin for 7 days. And then follow up with your PCP in 2 weeks. 2. If you have worsening of your symptoms or new symptoms arise, please go to the ER immediately if symptoms are severe.  Urinary Tract Infection Infections of the urinary tract can start in several places. A bladder infection (cystitis), a kidney infection (pyelonephritis), and a prostate infection (prostatitis) are different types of urinary tract infections (UTIs). They usually get better if treated with medicines (antibiotics) that kill germs. Take all the medicine until it is gone. You or your child may feel better in a few days, but TAKE ALL MEDICINE or the infection may not respond and may become more difficult to treat. HOME CARE INSTRUCTIONS   Drink enough water and fluids to keep the urine clear or pale yellow. Cranberry juice is especially recommended, in addition to large amounts of water.   Avoid caffeine, tea, and carbonated beverages. They tend to irritate the bladder.   Alcohol may irritate the prostate.   Only take over-the-counter or prescription medicines for pain, discomfort, or fever as directed by your caregiver.  To prevent further infections:  Empty the bladder often. Avoid holding urine for long periods of time.   After a bowel movement, women should cleanse from front to back. Use each tissue only once.   Empty the bladder before and after sexual intercourse.  FINDING OUT THE RESULTS OF YOUR TEST Not all test results are available during your visit. If your or your child's test results are not back during the visit, make an appointment with your caregiver to find out the results. Do not assume everything is normal if you have not heard from your caregiver or the medical facility. It is important for you to follow up on all test results. SEEK MEDICAL CARE IF:   There is back pain.   Your baby is older than 3  months with a rectal temperature of 100.5 F (38.1 C) or higher for more than 1 day.   Your or your child's problems (symptoms) are no better in 3 days. Return sooner if you or your child is getting worse.  SEEK IMMEDIATE MEDICAL CARE IF:   There is severe back pain or lower abdominal pain.   You or your child develops chills.   You have a fever.   Your baby is older than 3 months with a rectal temperature of 102 F (38.9 C) or higher.   Your baby is 53 months old or younger with a rectal temperature of 100.4 F (38 C) or higher.   There is nausea or vomiting.   There is continued burning or discomfort with urination.  MAKE SURE YOU:   Understand these instructions.   Will watch your condition.   Will get help right away if you are not doing well or get worse.  Document Released: 12/11/2004 Document Revised: 02/20/2011 Document Reviewed: 07/16/2006 [facility] Patient Information 2012 [place], [place].

## 2011-08-10 LAB — URINE CULTURE: Culture  Setup Time: 201305250628

## 2011-08-10 NOTE — ED Provider Notes (Signed)
I saw and evaluated the patient, reviewed the resident's note and I agree with the findings and plan.   .Face to face Exam:  General:  Awake HEENT:  Atraumatic Resp:  Normal effort Abd:  Nondistended Neuro:No focal weakness Lymph: No adenopathy   Nelia Shi, MD 08/10/11 1025

## 2011-08-17 ENCOUNTER — Emergency Department (HOSPITAL_BASED_OUTPATIENT_CLINIC_OR_DEPARTMENT_OTHER): Payer: Medicare Other

## 2011-08-17 ENCOUNTER — Emergency Department (HOSPITAL_BASED_OUTPATIENT_CLINIC_OR_DEPARTMENT_OTHER)
Admission: EM | Admit: 2011-08-17 | Discharge: 2011-08-17 | Disposition: A | Discharge status: Home / Self Care | Payer: Medicare Other | Attending: Emergency Medicine | Admitting: Emergency Medicine

## 2011-08-17 ENCOUNTER — Encounter (HOSPITAL_BASED_OUTPATIENT_CLINIC_OR_DEPARTMENT_OTHER): Payer: Self-pay | Admitting: Emergency Medicine

## 2011-08-17 DIAGNOSIS — Z79899 Other long term (current) drug therapy: Secondary | ICD-10-CM | POA: Insufficient documentation

## 2011-08-17 DIAGNOSIS — K5900 Constipation, unspecified: Secondary | ICD-10-CM

## 2011-08-17 DIAGNOSIS — M549 Dorsalgia, unspecified: Secondary | ICD-10-CM | POA: Insufficient documentation

## 2011-08-17 DIAGNOSIS — F319 Bipolar disorder, unspecified: Secondary | ICD-10-CM | POA: Insufficient documentation

## 2011-08-17 LAB — CBC
HCT: 35 % — ABNORMAL LOW (ref 36.0–46.0)
Hemoglobin: 12.2 g/dL (ref 12.0–15.0)
MCH: 29.8 pg (ref 26.0–34.0)
MCHC: 34.9 g/dL (ref 30.0–36.0)
MCV: 85.4 fL (ref 78.0–100.0)

## 2011-08-17 LAB — COMPREHENSIVE METABOLIC PANEL
Albumin: 4.2 g/dL (ref 3.5–5.2)
BUN: 17 mg/dL (ref 6–23)
Chloride: 102 mEq/L (ref 96–112)
Creatinine, Ser: 1 mg/dL (ref 0.50–1.10)
Total Bilirubin: 0.2 mg/dL — ABNORMAL LOW (ref 0.3–1.2)
Total Protein: 7 g/dL (ref 6.0–8.3)

## 2011-08-17 LAB — DIFFERENTIAL
Basophils Relative: 0 % (ref 0–1)
Eosinophils Absolute: 0 10*3/uL (ref 0.0–0.7)
Eosinophils Relative: 0 % (ref 0–5)
Monocytes Absolute: 0.3 10*3/uL (ref 0.1–1.0)
Monocytes Relative: 6 % (ref 3–12)

## 2011-08-17 LAB — URINALYSIS, ROUTINE W REFLEX MICROSCOPIC
Bilirubin Urine: NEGATIVE
Ketones, ur: NEGATIVE mg/dL
Nitrite: NEGATIVE
Urobilinogen, UA: 0.2 mg/dL (ref 0.0–1.0)

## 2011-08-17 MED ORDER — IOHEXOL 300 MG/ML  SOLN
100.0000 mL | Freq: Once | INTRAMUSCULAR | Status: AC | PRN
  Administered 2011-08-17: 100 mL via INTRAVENOUS

## 2011-08-17 MED ORDER — HYDROMORPHONE HCL PF 1 MG/ML IJ SOLN
1.0000 mg | Freq: Once | INTRAMUSCULAR | Status: AC
  Administered 2011-08-17: 1 mg via INTRAVENOUS
  Filled 2011-08-17: qty 1

## 2011-08-17 MED ORDER — SODIUM CHLORIDE 0.9 % IV SOLN
Freq: Once | INTRAVENOUS | Status: AC
  Administered 2011-08-17: 18:00:00 via INTRAVENOUS

## 2011-08-17 MED ORDER — ONDANSETRON HCL 4 MG/2ML IJ SOLN
4.0000 mg | Freq: Once | INTRAMUSCULAR | Status: AC
  Administered 2011-08-17: 4 mg via INTRAVENOUS
  Filled 2011-08-17: qty 2

## 2011-08-17 MED ORDER — POLYETHYLENE GLYCOL 3350 17 G PO PACK: 17.0000 g | PACK | Freq: Every day | ORAL | Status: AC

## 2011-08-17 MED ORDER — IOHEXOL 300 MG/ML  SOLN
20.0000 mL | INTRAMUSCULAR | Status: AC
  Administered 2011-08-17 (×2): 20 mL via ORAL

## 2011-08-17 NOTE — Discharge Instructions (Signed)

## 2011-08-17 NOTE — ED Notes (Signed)
Pt to ED w/ c/o pelvic & back pain - sts she thinks she has a bladder infection; abx complete from recent visit for same

## 2011-08-17 NOTE — ED Provider Notes (Signed)
History     CSN: [id number]  Arrival date & time 08/17/11  1342   First MD Initiated Contact with Patient 08/17/11 1534      Chief Complaint  Patient presents with  . Back Pain  . Abdominal Pain    (Consider location/radiation/quality/duration/timing/severity/associated sxs/prior treatment) Patient is a 46 y.o. female presenting with abdominal pain. The history is provided by the patient. No language interpreter was used.  Abdominal Pain The primary symptoms of the illness include abdominal pain and nausea. The primary symptoms of the illness do not include fever, vomiting or diarrhea. The current episode started 2 days ago. The onset of the illness was gradual. The problem has been gradually worsening.  The patient states that she believes she is currently not pregnant. Risk factors for an acute abdominal problem include a history of abdominal surgery. Additional symptoms associated with the illness include back pain. Significant associated medical issues do not include PUD or diabetes.  Pt reports she thinks she has a uti.  Pt was seen here 5/25 for uti.  Pt complains of abdominal pain and nausea.  No fever,  No chills.  Pt reports pain radiates to her back  Past Medical History  Diagnosis Date  . Bipolar 1 disorder   . Bipolar 1 disorder   . Dysrhythmia     Left sided heart diease  . Mental disorder   . Bipolar affective disorder, mixed, in partial or remission   . Headache   . Anxiety   . Renal disorder   . Kidney stones   . Depression   . Substance abuse     Fentanyl, Percocet. last use 04/2011    Past Surgical History  Procedure Date  . Vaginal hysterectomy   . Cholecystectomy   . Abdominal hysterectomy   . Foot surgery   . Colonoscopy     2001, 2008.  Father has colon cancer    Family History  Problem Relation Age of Onset  . Colon cancer Father 61  . Stomach cancer Neg Hx   . Rectal cancer Neg Hx   . Esophageal cancer Neg Hx     History  Substance Use  Topics  . Smoking status: Never Smoker   . Smokeless tobacco: Never Used  . Alcohol Use: No    OB History    Grav Para Term Preterm Abortions TAB SAB Ect Mult Living                  Review of Systems  Constitutional: Negative for fever.  Gastrointestinal: Positive for nausea and abdominal pain. Negative for vomiting and diarrhea.  Musculoskeletal: Positive for back pain.  All other systems reviewed and are negative.    Allergies  Review of patient's allergies indicates no known allergies.  Home Medications   Current Outpatient Rx  Name Route Sig Dispense Refill  . HYDROXYZINE PAMOATE 25 MG PO CAPS Oral Take 25 mg by mouth at bedtime.    Marland Kitchen LITHIUM CARBONATE ER 300 MG PO TBCR Oral Take 300 mg by mouth daily.    Marland Kitchen METHOCARBAMOL 500 MG PO TABS Oral Take 500 mg by mouth 4 (four) times daily. for foot pain.    . OXYCODONE-ACETAMINOPHEN 5-325 MG PO TABS Oral Take 1 tablet by mouth every 4 (four) hours as needed. For pain    . QUETIAPINE FUMARATE 400 MG PO TABS Oral Take 800 mg by mouth at bedtime.     . TRAZODONE HCL 100 MG PO TABS Oral Take 400 mg  by mouth at bedtime.    Marland Kitchen ZOLPIDEM TARTRATE 10 MG PO TABS Oral Take 10 mg by mouth at bedtime. For sleep    . CIPROFLOXACIN HCL 500 MG PO TABS Oral Take 1 tablet (500 mg total) by mouth 2 (two) times daily. 14 tablet 0  . HYDROCODONE-ACETAMINOPHEN 5-325 MG PO TABS Oral Take 1 tablet by mouth every 6 (six) hours as needed. Patient is using this medication for her foot pain.      BP 114/79  Pulse 91  Temp(Src) 98.4 F (36.9 C) (Oral)  Resp 18  Ht 5\' 6"  (1.676 m)  Wt 185 lb (83.915 kg)  BMI 29.86 kg/m2  SpO2 100%  Physical Exam  Nursing note and vitals reviewed. Constitutional: She is oriented to person, place, and time. She appears well-developed and well-nourished.  HENT:  Head: Normocephalic and atraumatic.  Eyes: Conjunctivae are normal. Pupils are equal, round, and reactive to light.  Neck: Normal range of motion. Neck  supple.  Cardiovascular: Normal rate, regular rhythm and normal heart sounds.   Pulmonary/Chest: Effort normal and breath sounds normal.  Abdominal: Soft. Bowel sounds are normal.  Musculoskeletal: Normal range of motion.  Neurological: She is alert and oriented to person, place, and time. She has normal reflexes.  Skin: Skin is warm.  Psychiatric: She has a normal mood and affect.    ED Course  Procedures (including critical care time)  Labs Reviewed  URINALYSIS, ROUTINE W REFLEX MICROSCOPIC - Abnormal; Notable for the following:    Leukocytes, UA SMALL (*)    All other components within normal limits  URINE MICROSCOPIC-ADD ON - Abnormal; Notable for the following:    Bacteria, UA FEW (*)    All other components within normal limits   No results found. Results for orders placed during the hospital encounter of 08/17/11  URINALYSIS, ROUTINE W REFLEX MICROSCOPIC      Component Value Range   Color, Urine YELLOW  YELLOW    APPearance CLEAR  CLEAR    Specific Gravity, Urine 1.020  1.005 - 1.030    pH 6.5  5.0 - 8.0    Glucose, UA NEGATIVE  NEGATIVE (mg/dL)   Hgb urine dipstick NEGATIVE  NEGATIVE    Bilirubin Urine NEGATIVE  NEGATIVE    Ketones, ur NEGATIVE  NEGATIVE (mg/dL)   Protein, ur NEGATIVE  NEGATIVE (mg/dL)   Urobilinogen, UA 0.2  0.0 - 1.0 (mg/dL)   Nitrite NEGATIVE  NEGATIVE    Leukocytes, UA SMALL (*) NEGATIVE   URINE MICROSCOPIC-ADD ON      Component Value Range   Squamous Epithelial / LPF RARE  RARE    WBC, UA 3-6  <3 (WBC/hpf)   Bacteria, UA FEW (*) RARE   CBC      Component Value Range   WBC 4.7  4.0 - 10.5 (K/uL)   RBC 4.10  3.87 - 5.11 (MIL/uL)   Hemoglobin 12.2  12.0 - 15.0 (g/dL)   HCT 11.9 (*) 14.7 - 46.0 (%)   MCV 85.4  78.0 - 100.0 (fL)   MCH 29.8  26.0 - 34.0 (pg)   MCHC 34.9  30.0 - 36.0 (g/dL)   RDW 82.9  56.2 - 13.0 (%)   Platelets 143 (*) 150 - 400 (K/uL)  DIFFERENTIAL      Component Value Range   Neutrophils Relative 63  43 - 77 (%)    Neutro Abs 3.0  1.7 - 7.7 (K/uL)   Lymphocytes Relative 31  12 - 46 (%)  Lymphs Abs 1.5  0.7 - 4.0 (K/uL)   Monocytes Relative 6  3 - 12 (%)   Monocytes Absolute 0.3  0.1 - 1.0 (K/uL)   Eosinophils Relative 0  0 - 5 (%)   Eosinophils Absolute 0.0  0.0 - 0.7 (K/uL)   Basophils Relative 0  0 - 1 (%)   Basophils Absolute 0.0  0.0 - 0.1 (K/uL)  COMPREHENSIVE METABOLIC PANEL      Component Value Range   Sodium 139  135 - 145 (mEq/L)   Potassium 4.5  3.5 - 5.1 (mEq/L)   Chloride 102  96 - 112 (mEq/L)   CO2 28  19 - 32 (mEq/L)   Glucose, Bld 102 (*) 70 - 99 (mg/dL)   BUN 17  6 - 23 (mg/dL)   Creatinine, Ser 4.09  0.50 - 1.10 (mg/dL)   Calcium 81.1  8.4 - 10.5 (mg/dL)   Total Protein 7.0  6.0 - 8.3 (g/dL)   Albumin 4.2  3.5 - 5.2 (g/dL)   AST 20  0 - 37 (U/L)   ALT 25  0 - 35 (U/L)   Alkaline Phosphatase 62  39 - 117 (U/L)   Total Bilirubin 0.2 (*) 0.3 - 1.2 (mg/dL)   GFR calc non Af Amer 67 (*) >90 (mL/min)   GFR calc Af Amer 78 (*) >90 (mL/min)   Ct Abdomen Pelvis W Contrast  08/17/2011  *RADIOLOGY REPORT*  Clinical Data: Abdominal and back pain for several days.  History of renal stones.  CT ABDOMEN AND PELVIS WITH CONTRAST  Technique:  Multidetector CT imaging of the abdomen and pelvis was performed following the standard protocol during bolus administration of intravenous contrast.  Contrast: OMNIPAQUE IOHEXOL 300 MG/ML  SOLN  Comparison: CT abdomen and pelvis 02/23/2011.  Findings: There is some dependent atelectatic change in the lung bases.  No pleural or pericardial effusion.  The patient is status post cholecystectomy.  The liver, biliary tree, adrenal glands, spleen, pancreas and kidneys appear normal. No renal stones are seen.  There is no hydronephrosis.  Postoperative change of hysterectomy is identified.  The patient has a moderately large stool burden throughout the colon.  The colon is otherwise unremarkable.  The stomach, small bowel and appendix appear normal.  There is  no focal bony abnormality.  IMPRESSION:  1.  No acute finding. 2.  Moderately large stool burden throughout the colon. 3.  Status post cholecystectomy and hysterectomy.  Original Report Authenticated By: Bernadene Bell. D'ALESSIO, M.D.     1. Constipation       MDM   Urine not impressive for uti,  Due to cont pain and recent visit,  I obtained Ct scan.   Ct shows constipation.  I counseled pt on results.   Pt is taking percocet since foot surgery.   I advised her narcotics can be constipating.   Pt given miralax.   Pt advised to follow up with her Md.        Lonia Skinner [place], [place] 08/17/11 2256

## 2011-08-18 ENCOUNTER — Encounter: Payer: Self-pay | Admitting: Gastroenterology

## 2011-08-20 ENCOUNTER — Encounter: Payer: Self-pay | Admitting: Internal Medicine

## 2011-08-20 ENCOUNTER — Ambulatory Visit (INDEPENDENT_AMBULATORY_CARE_PROVIDER_SITE_OTHER): Payer: Medicare Other | Admitting: Internal Medicine

## 2011-08-20 VITALS — BP 106/80 | HR 103 | Temp 97.9°F | Resp 18 | Ht 66.0 in

## 2011-08-20 DIAGNOSIS — K5900 Constipation, unspecified: Secondary | ICD-10-CM

## 2011-08-20 DIAGNOSIS — N390 Urinary tract infection, site not specified: Secondary | ICD-10-CM

## 2011-08-20 NOTE — Assessment & Plan Note (Signed)
Begin colace bid with benefiber.

## 2011-08-20 NOTE — Assessment & Plan Note (Signed)
Urology consult

## 2011-08-20 NOTE — Progress Notes (Signed)
  Subjective:    Patient ID: [name], female    DOB: 05-07-1965, 46 y.o.   MRN: [id number]  HPI Pt presents to clinic for followup of multiple medical problems. S/p two recent ED visits. First for possible UTI tx'ed with cipro. Second with abdominal pain and ct scan suggested constipation. Used dulcolax and enema's and pain resolved after BM. Is taking percocet after foot fracture. Has recurrent uti's with no formal evaluation. Past urine cultures failed to demonstrate single bacterial type.  Past Medical History  Diagnosis Date  . Bipolar 1 disorder   . Bipolar 1 disorder   . Dysrhythmia     Left sided heart diease  . Mental disorder   . Bipolar affective disorder, mixed, in partial or remission   . Headache   . Anxiety   . Renal disorder   . Kidney stones   . Depression   . Substance abuse     Fentanyl, Percocet. last use 04/2011   Past Surgical History  Procedure Date  . Vaginal hysterectomy   . Cholecystectomy   . Abdominal hysterectomy   . Foot surgery   . Colonoscopy     2001, 2008.  Father has colon cancer    reports that she has never smoked. She has never used smokeless tobacco. She reports that she uses illicit drugs (Other-see comments, Fentanyl, and Hydrocodone). She reports that she does not drink alcohol. family history includes Colon cancer (age of onset:52) in her father.  There is no history of Stomach cancer, and Rectal cancer, and Esophageal cancer, . No Known Allergies    Review of Systems see hpi     Objective:   Physical Exam  Nursing note and vitals reviewed. Constitutional: She appears well-developed and well-nourished.  Neurological: She is alert.  Psychiatric: She has a normal mood and affect.          Assessment & Plan:

## 2011-08-20 NOTE — ED Provider Notes (Signed)
Medical screening examination/treatment/procedure(s) were performed by non-physician practitioner and as supervising physician I was immediately available for consultation/collaboration.   Gwyneth Sprout, MD 08/20/11 1759

## 2011-08-20 NOTE — Patient Instructions (Signed)
Please take stool softener daily as well as a fiber supplement such as benefiber as long as you are taking narcotic pain medication

## 2011-08-27 ENCOUNTER — Telehealth: Payer: Self-pay | Admitting: Internal Medicine

## 2011-08-27 NOTE — Telephone Encounter (Signed)
Patient would like Phenergan called in to [facility] on [address] and [address] for nausea. She states that she was seen last week for this.

## 2011-08-27 NOTE — Telephone Encounter (Signed)
Call returned to patient at [phone], she states she has had nausea on a daily basis since her last office visit. . She denies vomiting, no abdominal pain, she denies dizziness with sitting or standing, denies excessive thirst and no dry mouth. She stated she has been constipated and was advised by Dr Rodena Medin that she may feel like this for a few days. She would like to know if she could get a Rx for nausea

## 2011-08-28 NOTE — Telephone Encounter (Signed)
Phenergan 25mg  po q6 hours prn n/v #15

## 2011-08-29 MED ORDER — PROMETHAZINE HCL 25 MG PO TABS: 25.0000 mg | ORAL_TABLET | Freq: Four times a day (QID) | ORAL | Status: DC | PRN

## 2011-08-29 NOTE — Telephone Encounter (Signed)
Call placed to patient at [phone], she was informed of Rx to pharmacy.

## 2011-09-12 ENCOUNTER — Encounter: Payer: Self-pay | Admitting: Gastroenterology

## 2011-09-12 ENCOUNTER — Ambulatory Visit (INDEPENDENT_AMBULATORY_CARE_PROVIDER_SITE_OTHER): Payer: Medicare Other | Admitting: Gastroenterology

## 2011-09-12 VITALS — BP 110/60 | HR 96 | Ht 66.0 in | Wt 196.4 lb

## 2011-09-12 DIAGNOSIS — K5900 Constipation, unspecified: Secondary | ICD-10-CM

## 2011-09-12 DIAGNOSIS — Z800 Family history of malignant neoplasm of digestive organs: Secondary | ICD-10-CM

## 2011-09-12 MED ORDER — MOVIPREP 100 G PO SOLR: 1.0000 | ORAL | Status: DC

## 2011-09-12 NOTE — Patient Instructions (Addendum)
You will be set up for a colonoscopy in about a month from now for FH of colon cancer, change in bowels. (LEC propofol). Start miralax (OTC) powder, take 4 doses today, 4 doses tomorrow. Then one dose every single day.

## 2011-09-12 NOTE — Progress Notes (Signed)
HPI: This is a   very pleasant 46 year old woman who is here with one of her daughters today.  Father had  Colon cancer, diagnosed in his early 66s.  She has had 2 colonoscopies.  No polyps no cancers (both done elsewhere),  The most recent was in [facility] in 2008.    She has been bothered by constipation.  She requires enema to move her bowels.  Has been for 4-5 months.  Bowels were not different prior to then, used to alternate more with constipation/diarrhea.  She has not had regular BM (every 1-3 days) in many years.  She uses enemas, about 3 per week.  Without enema she would not have bm for  Along time.  Tried dulcolax, not much. Has not tried fiber, has not tried miralax.  NO blood in her stool.  She lost 40 pounds last year, but the weight has come back on somewhat.     Review of systems: Pertinent positive and negative review of systems were noted in the above HPI section. Complete review of systems was performed and was otherwise normal.    Past Medical History  Diagnosis Date  . Dysrhythmia     Left sided heart diease  . Mental disorder   . Bipolar affective disorder, mixed, in partial or remission   . Headache   . Anxiety   . Renal disorder   . Kidney stones   . Depression   . 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 History of gallstones     Past Surgical History  Procedure Date  . Vaginal hysterectomy   . Cholecystectomy   . Total abdominal hysterectomy w/ bilateral salpingoophorectomy   . Foot surgery     right x 2  . Colonoscopy     2001, 2008.  Father has colon cancer    Current Outpatient Prescriptions  Medication Sig Dispense Refill  . HYDROcodone-acetaminophen (NORCO) 5-325 MG per tablet Take 1 tablet by mouth every 6 (six) hours as needed. Patient is using this medication for her foot pain.      . hydrOXYzine (VISTARIL) 25 MG capsule Take 25 mg by mouth at bedtime.      Marland Kitchen lithium carbonate (LITHOBID) 300 MG CR tablet Take 300 mg by mouth  daily.      . methocarbamol (ROBAXIN) 500 MG tablet Take 500 mg by mouth 4 (four) times daily. for foot pain.      . promethazine (PHENERGAN) 25 MG tablet Take 1 tablet (25 mg total) by mouth every 6 (six) hours as needed for nausea.  20 tablet  0  . QUEtiapine (SEROQUEL) 400 MG tablet Take 800 mg by mouth at bedtime.       . traZODone (DESYREL) 100 MG tablet Take 400 mg by mouth at bedtime.      Marland Kitchen zolpidem (AMBIEN) 10 MG tablet Take 10 mg by mouth at bedtime. For sleep      . DISCONTD: promethazine (PHENERGAN) 25 MG suppository Place 1 suppository (25 mg total) rectally every 6 (six) hours as needed for nausea.  12 each  0  . DISCONTD: promethazine (PHENERGAN) 25 MG tablet Take 1 tablet (25 mg total) by mouth every 6 (six) hours as needed for nausea.  15 tablet  0  . DISCONTD: promethazine (PHENERGAN) 25 MG tablet Take 1 tablet (25 mg total) by mouth every 6 (six) hours as needed for nausea.  30 tablet  0  . DISCONTD: promethazine (PHENERGAN) 25 MG tablet  Take 1 tablet (25 mg total) by mouth every 6 (six) hours as needed for nausea.  10 tablet  0    Allergies as of 09/12/2011  . (No Known Allergies)    Family History  Problem Relation Age of Onset  . Colon cancer Father 78  . Stomach cancer Neg Hx   . Rectal cancer Neg Hx   . Esophageal cancer Neg Hx   . Breast cancer Mother   . Breast cancer Father   . Uterine cancer Mother     History   Social History  . Marital Status: Married    Spouse Name: N/A    Number of Children: 6  . Years of Education: N/A   Occupational History  . nursing student    Social History Main Topics  . Smoking status: Never Smoker   . Smokeless tobacco: Never Used  . Alcohol Use: No  . Drug Use: Yes    Special: Other-see comments, Fentanyl, Hydrocodone     see triage note  . Sexually Active: No   Other Topics Concern  . 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  2 sons ( 17, 84 )4 daughters ( 90, 20,  82, 11)Smoking Status:  neverDoes Patient Exercise:  noCaffeine use/day:  2 -3 beverages daily       Physical Exam: BP 110/60  Pulse 96  Ht 5\' 6"  (1.676 m)  Wt 196 lb 6 oz (89.075 kg)  BMI 31.70 kg/m2 Constitutional: generally well-appearing Psychiatric: alert and oriented x3 Eyes: extraocular movements intact Mouth: oral pharynx moist, no lesions Neck: supple no lymphadenopathy Cardiovascular: heart regular rate and rhythm Lungs: clear to auscultation bilaterally Abdomen: soft, nontender, nondistended, no obvious ascites, no peritoneal signs, normal bowel sounds Extremities: no lower extremity edema bilaterally Skin: no lesions on visible extremities    Assessment and plan: 46 y.o. female with  chronic constipation, family history of colon cancer, recent change in bowels  Given her father's history, her bowels have changed somewhat recently we should proceed with colonoscopy at her soonest convenience. Her last one was probably 2008 or 2009. I think her biggest problem is her chronic constipation. Hydrocodone twice daily likely contributes to it. Interestingly none of her other psychotropic, antianxiety medicines really can contribute. They will however make sedation with usual moderate sedation medicines potentially much more difficult and solid like to perform a colonoscopy with anesthesia support, propofol. In the meantime she is going to load on MiraLax and then take 1 dose every day.

## 2011-09-25 ENCOUNTER — Other Ambulatory Visit: Payer: Self-pay | Admitting: Internal Medicine

## 2011-09-25 ENCOUNTER — Ambulatory Visit (INDEPENDENT_AMBULATORY_CARE_PROVIDER_SITE_OTHER): Payer: Medicare Other | Admitting: Internal Medicine

## 2011-09-25 ENCOUNTER — Encounter: Payer: Self-pay | Admitting: Internal Medicine

## 2011-09-25 VITALS — BP 116/60 | HR 90 | Temp 98.0°F | Resp 18 | Ht 66.5 in | Wt 198.0 lb

## 2011-09-25 DIAGNOSIS — Z0289 Encounter for other administrative examinations: Secondary | ICD-10-CM

## 2011-09-25 DIAGNOSIS — R7309 Other abnormal glucose: Secondary | ICD-10-CM

## 2011-09-25 DIAGNOSIS — R739 Hyperglycemia, unspecified: Secondary | ICD-10-CM | POA: Insufficient documentation

## 2011-09-25 DIAGNOSIS — Z020 Encounter for examination for admission to educational institution: Secondary | ICD-10-CM | POA: Insufficient documentation

## 2011-09-25 DIAGNOSIS — Z79899 Other long term (current) drug therapy: Secondary | ICD-10-CM

## 2011-09-25 DIAGNOSIS — Z283 Underimmunization status: Secondary | ICD-10-CM

## 2011-09-25 NOTE — Progress Notes (Signed)
  Subjective:    Patient ID: [name], female    DOB: February 16, 1966, 46 y.o.   MRN: [id number]  HPI Pt presents to clinic for followup of multiple medical problems. Needs nursing school physical form completed as well as vaccination titers and PPD. Has not completed ordered mammogram. Reviewed mild hyperglycemia.   Past Medical History  Diagnosis Date  . Dysrhythmia     Left sided heart diease  . Mental disorder   . Bipolar affective disorder, mixed, in partial or remission   . Headache   . Anxiety   . Renal disorder   . Kidney stones   . Depression   . 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 History of gallstones    Past Surgical History  Procedure Date  . Vaginal hysterectomy   . Cholecystectomy   . Total abdominal hysterectomy w/ bilateral salpingoophorectomy   . Foot surgery     right x 2  . Colonoscopy     2001, 2008.  Father has colon cancer    reports that she has never smoked. She has never used smokeless tobacco. She reports that she uses illicit drugs (Other-see comments, Fentanyl, and Hydrocodone). She reports that she does not drink alcohol. family history includes Breast cancer in her father and mother; Colon cancer (age of onset:52) in her father; and Uterine cancer in her mother.  There is no history of Stomach cancer, and Rectal cancer, and Esophageal cancer, . No Known Allergies   Review of Systems see hpi     Objective:   Physical Exam  Nursing note and vitals reviewed. Constitutional: She appears well-developed and well-nourished. No distress.  HENT:  Head: Normocephalic and atraumatic.  Right Ear: Tympanic membrane, external ear and ear canal normal.  Left Ear: Tympanic membrane, external ear and ear canal normal.  Nose: Nose normal.  Mouth/Throat: Oropharynx is clear and moist. No oropharyngeal exudate.  Eyes: Conjunctivae and EOM are normal. Pupils are equal, round, and reactive to light. Right eye exhibits no discharge. Left  eye exhibits no discharge. No scleral icterus.  Neck: Neck supple. No thyromegaly present.  Cardiovascular: Normal rate, regular rhythm and normal heart sounds.  Exam reveals no gallop and no friction rub.   No murmur heard. Pulmonary/Chest: Effort normal and breath sounds normal. No respiratory distress. She has no wheezes. She has no rales.  Abdominal: Soft. Bowel sounds are normal. She exhibits no distension and no mass. There is no hepatomegaly. There is no tenderness. There is no rebound and no guarding.  Musculoskeletal:       Left foot boot  Lymphadenopathy:    She has no cervical adenopathy.  Neurological: She is alert.  Skin: Skin is warm and dry. She is not diaphoretic.  Psychiatric: She has a normal mood and affect.          Assessment & Plan:

## 2011-09-25 NOTE — Assessment & Plan Note (Signed)
Form completed. Schedule ppd placement and read. Obtain MMR, hep b and varicella titers.

## 2011-09-25 NOTE — Patient Instructions (Signed)
Please schedule fasting lipid- chol screening prior to next visit

## 2011-09-25 NOTE — Assessment & Plan Note (Signed)
Obtain a1c.  

## 2011-09-26 LAB — HEMOGLOBIN A1C: Hgb A1c MFr Bld: 5.1 % (ref <5.7)

## 2011-09-26 LAB — HEPATIC FUNCTION PANEL
ALT: 15 U/L (ref 0–35)
AST: 14 U/L (ref 0–37)
Indirect Bilirubin: 0.2 mg/dL (ref 0.0–0.9)
Total Protein: 7 g/dL (ref 6.0–8.3)

## 2011-09-26 LAB — RUBELLA SCREEN: Rubella: 60.5 IU/mL — ABNORMAL HIGH

## 2011-10-01 ENCOUNTER — Ambulatory Visit: Payer: Self-pay

## 2011-10-01 ENCOUNTER — Telehealth: Payer: Self-pay | Admitting: Internal Medicine

## 2011-10-01 LAB — HEPATITIS B SURFACE ANTIBODY, QUANTITATIVE: Hepatitis B-Post: 0.3 m[IU]/mL

## 2011-10-01 NOTE — Telephone Encounter (Signed)
Patient is calling for last lab results

## 2011-10-01 NOTE — Telephone Encounter (Signed)
LMOM with contact name & number to inform patient that [facility] did not receive the original Hep B titer ordered others on 07.11.13; had to call & placed add-on order today, we will be in touch with her as soon as we get the final lab results & MD signs off/SLS

## 2011-10-02 NOTE — Telephone Encounter (Signed)
Notes Recorded by Regis Bill, CMA on 10/02/2011 at 12:15 PM Patient called needing letter for Td & MMR titer for school, printed & ready for p/u/SLS She does not need the others [including Hep B] until next semester at school. ------

## 2011-10-06 NOTE — Telephone Encounter (Signed)
Patient informed labs show not immune to Hepatitis B, needs series [3] of vaccinations; pt understood & agreed. Pt has nurse visit on Thursday for PPD placement, will give first Hep B in series at that time/SLS

## 2011-10-07 ENCOUNTER — Ambulatory Visit: Payer: Self-pay

## 2011-10-08 ENCOUNTER — Ambulatory Visit (INDEPENDENT_AMBULATORY_CARE_PROVIDER_SITE_OTHER): Payer: Medicare Other | Admitting: Internal Medicine

## 2011-10-08 DIAGNOSIS — Z23 Encounter for immunization: Secondary | ICD-10-CM

## 2011-10-10 ENCOUNTER — Encounter: Payer: Self-pay | Admitting: *Deleted

## 2011-10-10 ENCOUNTER — Encounter: Payer: Self-pay | Admitting: Internal Medicine

## 2011-10-10 ENCOUNTER — Ambulatory Visit (INDEPENDENT_AMBULATORY_CARE_PROVIDER_SITE_OTHER): Payer: Medicare Other | Admitting: Internal Medicine

## 2011-10-10 VITALS — BP 118/72 | HR 94 | Temp 98.2°F | Resp 16 | Wt 199.0 lb

## 2011-10-10 DIAGNOSIS — M79673 Pain in unspecified foot: Secondary | ICD-10-CM

## 2011-10-10 DIAGNOSIS — M79609 Pain in unspecified limb: Secondary | ICD-10-CM

## 2011-10-10 LAB — TB SKIN TEST
Induration: 0 mm
TB Skin Test: NEGATIVE

## 2011-10-10 MED ORDER — TRAMADOL HCL 50 MG PO TABS: 50.0000 mg | ORAL_TABLET | Freq: Three times a day (TID) | ORAL | Status: DC | PRN

## 2011-10-10 NOTE — Assessment & Plan Note (Signed)
Avoid nsaids due to lithium use. Provide with temporary ultram prn prescription and agrees to contact and follow up with orthopedics closely.

## 2011-10-10 NOTE — Progress Notes (Signed)
  Subjective:    Patient ID: [name], female    DOB: 04-27-1965, 46 y.o.   MRN: [id number]  HPI Pt presents to clinic for evaluation of right foot pain. H/o foot fx followed by orthopedics. Currently pursing therapy under direction of ortho. Notes recent flare of pain without injury/trauma. States is taking vicodin bid prescribed by orthopedics. With pain flare has only #6 quantity left. Contacted orthopedics office but no response yet.   Past Medical History  Diagnosis Date  . Dysrhythmia     Left sided heart diease  . Mental disorder   . Bipolar affective disorder, mixed, in partial or remission   . Headache   . Anxiety   . Renal disorder   . Kidney stones   . Depression   . 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 History of gallstones    Past Surgical History  Procedure Date  . Vaginal hysterectomy   . Cholecystectomy   . Total abdominal hysterectomy w/ bilateral salpingoophorectomy   . Foot surgery     right x 2  . Colonoscopy     2001, 2008.  Father has colon cancer    reports that she has never smoked. She has never used smokeless tobacco. She reports that she uses illicit drugs (Other-see comments, Fentanyl, and Hydrocodone). She reports that she does not drink alcohol. family history includes Breast cancer in her father and mother; Colon cancer (age of onset:52) in her father; and Uterine cancer in her mother.  There is no history of Stomach cancer, and Rectal cancer, and Esophageal cancer, . No Known Allergies   Review of Systems see hpi     Objective:   Physical Exam  Nursing note and vitals reviewed. Constitutional: She appears well-developed and well-nourished. No distress.  HENT:  Head: Normocephalic and atraumatic.  Musculoskeletal:       Able to wt bear and ambulate without assistance. Tenderness located at previous surgical site (well healed).   Neurological: She is alert.  Skin: Skin is warm and dry. She is not diaphoretic.    Psychiatric: She has a normal mood and affect.          Assessment & Plan:

## 2011-10-15 ENCOUNTER — Emergency Department (HOSPITAL_BASED_OUTPATIENT_CLINIC_OR_DEPARTMENT_OTHER)
Admission: EM | Admit: 2011-10-15 | Discharge: 2011-10-15 | Disposition: A | Discharge status: Home / Self Care | Payer: Medicare Other | Attending: Emergency Medicine | Admitting: Emergency Medicine

## 2011-10-15 ENCOUNTER — Encounter (HOSPITAL_BASED_OUTPATIENT_CLINIC_OR_DEPARTMENT_OTHER): Payer: Self-pay | Admitting: *Deleted

## 2011-10-15 DIAGNOSIS — F3189 Other bipolar disorder: Secondary | ICD-10-CM | POA: Insufficient documentation

## 2011-10-15 DIAGNOSIS — F411 Generalized anxiety disorder: Secondary | ICD-10-CM | POA: Insufficient documentation

## 2011-10-15 DIAGNOSIS — R51 Headache: Secondary | ICD-10-CM | POA: Insufficient documentation

## 2011-10-15 MED ORDER — PROMETHAZINE HCL 25 MG/ML IJ SOLN
12.5000 mg | Freq: Once | INTRAMUSCULAR | Status: AC
  Administered 2011-10-15: 12.5 mg via INTRAVENOUS
  Filled 2011-10-15: qty 1

## 2011-10-15 MED ORDER — PROMETHAZINE HCL 25 MG/ML IJ SOLN
25.0000 mg | Freq: Once | INTRAMUSCULAR | Status: AC
  Administered 2011-10-15: 25 mg via INTRAVENOUS
  Filled 2011-10-15: qty 1

## 2011-10-15 MED ORDER — HYDROMORPHONE HCL PF 1 MG/ML IJ SOLN
1.0000 mg | Freq: Once | INTRAMUSCULAR | Status: AC
  Administered 2011-10-15: 1 mg via INTRAVENOUS
  Filled 2011-10-15: qty 1

## 2011-10-15 MED ORDER — SODIUM CHLORIDE 0.9 % IV SOLN
Freq: Once | INTRAVENOUS | Status: AC
  Administered 2011-10-15: 13:00:00 via INTRAVENOUS

## 2011-10-15 NOTE — ED Notes (Signed)
Patient states she was woke up this morning at 0630 with a migraine headache behind her right eye. Associated with nausea and vomiting.  States this migraine is like her normal migraines.

## 2011-10-15 NOTE — ED Provider Notes (Signed)
History     CSN: [id number]  Arrival date & time 10/15/11  1127   First MD Initiated Contact with Patient 10/15/11 1159      Chief Complaint  Patient presents with  . Migraine    (Consider location/radiation/quality/duration/timing/severity/associated sxs/prior treatment) Patient is a 46 y.o. female presenting with migraine. The history is provided by the patient. No language interpreter was used.  Migraine This is a new problem. The current episode started today. The problem occurs constantly. The problem has been gradually worsening. Associated symptoms include headaches. Nothing aggravates the symptoms. She has tried nothing for the symptoms.  Pt reports she has a hitory of one or two migranes a year.   Past Medical History  Diagnosis Date  . Dysrhythmia     Left sided heart diease  . Mental disorder   . Bipolar affective disorder, mixed, in partial or remission   . Headache   . Anxiety   . Renal disorder   . Kidney stones   . Depression   . 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 History of gallstones     Past Surgical History  Procedure Date  . Vaginal hysterectomy   . Cholecystectomy   . Total abdominal hysterectomy w/ bilateral salpingoophorectomy   . Foot surgery     right x 2  . Colonoscopy     2001, 2008.  Father has colon cancer    Family History  Problem Relation Age of Onset  . Colon cancer Father 18  . Stomach cancer Neg Hx   . Rectal cancer Neg Hx   . Esophageal cancer Neg Hx   . Breast cancer Mother   . Breast cancer Father   . Uterine cancer Mother     History  Substance Use Topics  . Smoking status: Never Smoker   . Smokeless tobacco: Never Used  . Alcohol Use: No    OB History    Grav Para Term Preterm Abortions TAB SAB Ect Mult Living                  Review of Systems  Neurological: Positive for headaches.  All other systems reviewed and are negative.    Allergies  Review of patient's allergies  indicates no known allergies.  Home Medications   Current Outpatient Rx  Name Route Sig Dispense Refill  . HYDROCODONE-ACETAMINOPHEN 5-325 MG PO TABS Oral Take 1 tablet by mouth every 6 (six) hours as needed. Patient is using this medication for her foot pain.    Marland Kitchen HYDROXYZINE PAMOATE 25 MG PO CAPS Oral Take 25 mg by mouth at bedtime.    Marland Kitchen LITHIUM CARBONATE ER 300 MG PO TBCR Oral Take 300 mg by mouth daily.    Marland Kitchen METHOCARBAMOL 500 MG PO TABS Oral Take 500 mg by mouth 4 (four) times daily. for foot pain.    Marland Kitchen QUETIAPINE FUMARATE 400 MG PO TABS Oral Take 800 mg by mouth at bedtime.     . TRAMADOL HCL 50 MG PO TABS Oral Take 50 mg by mouth every 8 (eight) hours as needed. For anxiety    . TRAZODONE HCL 100 MG PO TABS Oral Take 400 mg by mouth at bedtime.    Marland Kitchen ZOLPIDEM TARTRATE 10 MG PO TABS Oral Take 10 mg by mouth at bedtime. For sleep    . PROMETHAZINE HCL 25 MG PO TABS Oral Take 1 tablet (25 mg total) by mouth every 6 (six) hours as needed for  nausea. 20 tablet 0    BP 104/71  Pulse 85  Temp 98.2 F (36.8 C) (Oral)  Resp 18  SpO2 93%  Physical Exam  Nursing note and vitals reviewed. Constitutional: She appears well-developed.  HENT:  Head: Normocephalic and atraumatic.  Right Ear: External ear normal.  Left Ear: External ear normal.  Nose: Nose normal.  Mouth/Throat: Oropharynx is clear and moist.  Eyes: Conjunctivae and EOM are normal. Pupils are equal, round, and reactive to light.  Neck: Normal range of motion. Neck supple.  Cardiovascular: Normal rate, regular rhythm and normal heart sounds.   Pulmonary/Chest: Effort normal.  Abdominal: Soft.  Musculoskeletal: Normal range of motion.  Neurological: She is alert.  Psychiatric: She has a normal mood and affect.    ED Course  Procedures (including critical care time)  Labs Reviewed - No data to display No results found.   1. Headache       MDM  Pt given dilaudid and phenergan.  Pt reports some relief with 1st  dosage and request a second dosage.  Pt was offered [facility] cocktail but she prefers dilaudid and phenergan        Lonia Skinner St[place], [place] 10/15/11 1625

## 2011-10-15 NOTE — ED Provider Notes (Signed)
Medical screening examination/treatment/procedure(s) were performed by non-physician practitioner and as supervising physician I was immediately available for consultation/collaboration.   Glynn Octave, MD 10/15/11 [phone]

## 2011-10-15 NOTE — ED Notes (Signed)
Attempted IV access x1 in right hand unsuccessful 

## 2011-10-16 ENCOUNTER — Ambulatory Visit (HOSPITAL_BASED_OUTPATIENT_CLINIC_OR_DEPARTMENT_OTHER): Payer: Medicare Other | Admitting: Medical

## 2011-10-16 ENCOUNTER — Other Ambulatory Visit (HOSPITAL_BASED_OUTPATIENT_CLINIC_OR_DEPARTMENT_OTHER): Payer: Medicare Other | Admitting: Lab

## 2011-10-16 ENCOUNTER — Ambulatory Visit (HOSPITAL_BASED_OUTPATIENT_CLINIC_OR_DEPARTMENT_OTHER)
Admission: RE | Admit: 2011-10-16 | Discharge: 2011-10-16 | Disposition: A | Discharge status: Home / Self Care | Payer: Medicare Other | Source: Ambulatory Visit | Attending: Hematology & Oncology | Admitting: Hematology & Oncology

## 2011-10-16 DIAGNOSIS — Z803 Family history of malignant neoplasm of breast: Secondary | ICD-10-CM

## 2011-10-16 DIAGNOSIS — D72819 Decreased white blood cell count, unspecified: Secondary | ICD-10-CM

## 2011-10-16 DIAGNOSIS — D61818 Other pancytopenia: Secondary | ICD-10-CM | POA: Insufficient documentation

## 2011-10-16 DIAGNOSIS — R161 Splenomegaly, not elsewhere classified: Secondary | ICD-10-CM | POA: Insufficient documentation

## 2011-10-16 DIAGNOSIS — D696 Thrombocytopenia, unspecified: Secondary | ICD-10-CM

## 2011-10-16 LAB — CBC WITH DIFFERENTIAL (CANCER CENTER ONLY)
BASO%: 0 % (ref 0.0–2.0)
Eosinophils Absolute: 0 10*3/uL (ref 0.0–0.5)
MCH: 29.9 pg (ref 26.0–34.0)
MONO#: 0.2 10*3/uL (ref 0.1–0.9)
MONO%: 4.9 % (ref 0.0–13.0)
NEUT#: 3.3 10*3/uL (ref 1.5–6.5)
Platelets: 140 10*3/uL — ABNORMAL LOW (ref 145–400)
RBC: 4.11 10*6/uL (ref 3.70–5.32)
RDW: 12.3 % (ref 11.1–15.7)
WBC: 4.9 10*3/uL (ref 3.9–10.0)

## 2011-10-16 LAB — CHCC SATELLITE - SMEAR

## 2011-10-16 NOTE — Progress Notes (Signed)
Patient Name :[name], [name] MR #[id number] DOB: 1965-11-13 Encounter Date: 10/16/2011 Dictated by Eunice Blase, PA-C  Diagnoses:  #1 transient leukopenia, pancytopenia, secondary to kidney infection back in March 2013 #2 possible splenomegaly.  Interim history: [name] presents today for an office followup visit.  We first saw [name] back in March of 2013 secondary to transient leukopenia, pancytopenia, secondary to a kidney infection.  Apparently at that time.  [name] had been hospitalized for [name] kidney infection and was told that all [name] blood counts were low, and [name] spleen was big.  Unfortunately, at that time, we do not have any records from that hospitalization.  We saw [name] in March.  [name] white count was 6.2, hemoglobin 12.3, hematocrit 35.1, and platelet count was 185,000.  MCV was 86.  On physical exam.  There is no obvious splenomegaly.  [name] also shared with [place] that there is a history of breast cancer in [name] family.  [name], as well as [name] had breast cancer.  [name] also had breast cancer.  [name] was set up to have a bilateral mammogram.  However, [name] has not gone yet.  I did stress the importance of getting a mammogram soon, especially, since [name]'s 46, as well as a family history of breast cancer.  We did discuss the BRCA, gene, and possibly setting [name] up with Harriett Sine, our geneticist.  [name] otherwise does not have any complaints.  [name] denies any bleeding or bruising.  [name] does not have any rashes.  [name] denies any nausea, vomiting, any unintentional weight loss.  [name] denies any diarrhea, constipation, cough, chest pain, shortness of breath.  [name] denies any fevers, chills, or night sweats.  [name] denies any lower extremity swelling.  [name] blood counts look great .  Today without white count of 4.9, hemoglobin 12.3, hematocrit 35.5.  Platelets 140,000, MCV 86.  Review of Systems: Pt. Denies any changes in their vision, hearing, adenopathy, fevers, chills, nausea,  vomiting, diarrhea, constipation, chest pain, shortness of breath, passing blood, passing out, blacking out,  any changes in skin, joints, neurologic or psychiatric except as noted.  Physical Exam: This is a pleasant, well-developed, well-nourished, 46 year old, white female, in no obvious distress Vitals: Temperature 97.0 degrees, pulse 92, respirations 18, blood pressure 119/74 weight 199 pounds HEENT reveals a normocephalic, atraumatic skull, no scleral icterus, no oral lesions  Neck is supple without any cervical or supraclavicular adenopathy.  Lungs are clear to auscultation bilaterally. There are no wheezes, rales or rhonci Cardiac is regular rate and rhythm with a normal S1 and S2. There are no murmurs, rubs, or bruits.  Abdomen is soft with good bowel sounds there's a palpable mass. There is no palpable hepatosplenomegaly. There is no palpable fluid wave.  Musculoskeletal no tenderness of the spine, ribs, or hips.  Extremities there are no clubbing, cyanosis, or edema.  Skin no petechia, purpura or ecchymosis Neurologic is nonfocal.  Laboratory Data: White count 4.9, hemoglobin 12.3, hematocrit 35.5.  Platelets 140,000, MCV 86  Imaging US Abdomen Complete IMPRESSION:  1. Essentially normal abdominal ultrasound in a patient status  post cholecystectomy.  2. Specifically, the spleen is normal in size, contour and  echogenicity. The spleen measures up to 12 cm in craniocaudal  dimension (13 cm is the upper limit of normal) and this measurement  is consistent with the prior CT scan of the abdomen and pelvis  dated 08/17/2011.   Current Outpatient Prescriptions on File Prior to Visit  Medication Sig  Dispense Refill  . hydrOXYzine (VISTARIL) 25 MG capsule Take 25 mg by mouth at bedtime.      Marland Kitchen lithium carbonate (LITHOBID) 300 MG CR tablet Take 300 mg by mouth daily.      . QUEtiapine (SEROQUEL) 400 MG tablet Take 800 mg by mouth at bedtime.       . traMADol (ULTRAM) 50 MG tablet  Take 50 mg by mouth every 8 (eight) hours as needed. For anxiety      . traZODone (DESYREL) 100 MG tablet Take 400 mg by mouth at bedtime.      Marland Kitchen zolpidem (AMBIEN) 10 MG tablet Take 10 mg by mouth at bedtime. For sleep      . HYDROcodone-acetaminophen (NORCO) 5-325 MG per tablet Take 1 tablet by mouth every 6 (six) hours as needed. Patient is using this medication for [name] foot pain.      . methocarbamol (ROBAXIN) 500 MG tablet Take 500 mg by mouth 4 (four) times daily. for foot pain.      . promethazine (PHENERGAN) 25 MG tablet Take 1 tablet (25 mg total) by mouth every 6 (six) hours as needed for nausea.  20 tablet  0    Assessment/Plan: This is a pleasant, 46 year old, female, with the following issues: #1 history of transient leukopenia/thrombocytopenia, secondary to a kidney infection.  [name] obviously does not have a hematologic disorder.  [name] counts look excellent today.  [name] complete abdominal ultrasound revealed a normal spleen.  #2.  Family history of breast cancer-I strongly encouraged [name] to get a bilateral mammogram as soon as possible.  We did discuss the BRCA gene, and how to get in touch with Harriett Sine, our geneticist.  [name] voiced [name] understanding with this issue.  #3 followup-we do not need to see [name] back as [name] does not have a real hematologic disorder.  Should [name] have any further issues.  We will be more than happy to follow back up with [name].

## 2011-10-20 ENCOUNTER — Telehealth: Payer: Self-pay | Admitting: *Deleted

## 2011-10-20 NOTE — Telephone Encounter (Signed)
Called patient to let her know that her ultrasound was normal and spleen was not enlarged per dr. Myna Hidalgo.

## 2011-10-20 NOTE — Telephone Encounter (Signed)
Message copied by Anselm Jungling on Mon Oct 20, 2011 10:08 AM ------      Message from: Josph Macho      Created: Fri Oct 17, 2011  6:12 PM       Please call and tell her that her spleen is not enlarged. Everything looks normal with her ultrasound. This is all excellent news. Thank you. Cindee Lame

## 2011-10-21 ENCOUNTER — Ambulatory Visit: Payer: Self-pay | Admitting: Internal Medicine

## 2011-10-22 ENCOUNTER — Encounter (HOSPITAL_COMMUNITY): Payer: Self-pay

## 2011-10-22 ENCOUNTER — Emergency Department (INDEPENDENT_AMBULATORY_CARE_PROVIDER_SITE_OTHER)
Admission: EM | Admit: 2011-10-22 | Discharge: 2011-10-22 | Disposition: A | Discharge status: Home / Self Care | Payer: No Typology Code available for payment source | Source: Home / Self Care | Attending: Emergency Medicine | Admitting: Emergency Medicine

## 2011-10-22 DIAGNOSIS — K047 Periapical abscess without sinus: Secondary | ICD-10-CM

## 2011-10-22 MED ORDER — OXYCODONE-ACETAMINOPHEN 5-325 MG PO TABS: ORAL_TABLET | ORAL | Status: AC

## 2011-10-22 MED ORDER — CLINDAMYCIN HCL 300 MG PO CAPS: 300.0000 mg | ORAL_CAPSULE | Freq: Four times a day (QID) | ORAL | Status: AC

## 2011-10-22 MED ORDER — HYDROCODONE-ACETAMINOPHEN 5-325 MG PO TABS: ORAL_TABLET | ORAL | Status: DC

## 2011-10-22 NOTE — ED Provider Notes (Signed)
Chief Complaint  Patient presents with  . Dental Problem    History of Present Illness:   Charlottie is a 46 year old female who has had a one-week history of pain in several teeth in her left lower jaw. There's been some swelling of the gingiva in the cheek. The pain is localized over the lower left second molar and lower left first premolar. There is no purulent drainage. It hurts to  [facility] on that side or to open her mouth but there is no pain with swallowing no difficulty breathing. She denies any headache, eye pain, ear pain, neck pain or swelling, coughing, wheezing, or shortness of breath.  Review of Systems:  Other than noted above, the patient denies any of the following symptoms: Systemic:  No fever, chills, sweats or weight loss. ENT:  No headache, ear ache, sore throat, nasal congestion, facial pain, or swelling. Lymphatic:  No adenopathy. Lungs:  No coughing, wheezing or shortness of breath.  PMFSH:  Past medical history, family history, social history, meds, and allergies were reviewed.  Physical Exam:   Vital signs:  BP 111/79  Pulse 80  Temp 98.9 F (37.2 C) (Oral)  Resp 12  SpO2 97% General:  Alert, oriented, in no distress. ENT:  TMs and canals normal.  Nasal mucosa normal. Mouth exam:  She has some dental decay at the gumline of the first premolar. The second molar has a temporary filling in place, but no obvious decay. Both teeth were tender to palpation. There was no gingival swelling and no purulent drainage. No swelling of the floor the mouth and the pharynx was clear. Neck:  No swelling or adenopathy. Lungs:  Breath sounds clear and equal bilaterally.  No wheezes, rales or rhonchi. Heart:  Regular rhythm.  No gallops or murmers. Skin:  Clear, warm and dry.  Assessment:  The encounter diagnosis was Dental abscess.  Plan:   1.  The following meds were prescribed:   New Prescriptions   CLINDAMYCIN (CLEOCIN) 300 MG CAPSULE    Take 1 capsule (300 mg total) by mouth 4  (four) times daily.   OXYCODONE-ACETAMINOPHEN (PERCOCET) 5-325 MG PER TABLET    1 to 2 tablets every 6 hours as needed for pain.   2.  The patient was instructed in symptomatic care and handouts were given. 3.  The patient was told to return if becoming worse in any way, if no better in 3 or 4 days, and given some red flag symptoms that would indicate earlier return, especially difficulty breathing. 4.  The patient was told to follow up with a dentist as soon as possible.    Reuben Likes, MD 10/22/11 [phone]

## 2011-10-22 NOTE — ED Notes (Signed)
Discussed importance of antibiotic compliance w upcoming foot surgery, no tylenol w Rx pain medication , cautioned about driving, constipation w pain meds

## 2011-10-22 NOTE — ED Notes (Signed)
C/o bad tooth, reportedly has an appointment next week to see dental health provider

## 2011-10-24 ENCOUNTER — Encounter: Payer: Self-pay | Admitting: Gastroenterology

## 2011-10-24 ENCOUNTER — Ambulatory Visit (AMBULATORY_SURGERY_CENTER): Payer: Medicare Other | Admitting: Gastroenterology

## 2011-10-24 VITALS — BP 127/74 | HR 87 | Temp 98.5°F | Resp 11 | Ht 66.0 in | Wt 196.0 lb

## 2011-10-24 DIAGNOSIS — K5900 Constipation, unspecified: Secondary | ICD-10-CM

## 2011-10-24 DIAGNOSIS — Z800 Family history of malignant neoplasm of digestive organs: Secondary | ICD-10-CM

## 2011-10-24 MED ORDER — SODIUM CHLORIDE 0.9 % IV SOLN: 500.0000 mL | INTRAVENOUS | Status: DC

## 2011-10-24 NOTE — Progress Notes (Signed)
Exam completed prep poor.

## 2011-10-24 NOTE — Op Note (Signed)
[facility] [address] [place], [place]  [address]  COLONOSCOPY PROCEDURE REPORT  PATIENT:  [name], [name]  MR#:  [id number] BIRTHDATE:  Aug 29, 1965, 46 yrs. old  GENDER:  female ENDOSCOPIST:  Rachael Fee, MD REF. BY:  Charlynn Court, M.D. PROCEDURE DATE:  10/24/2011 PROCEDURE:  Colonoscopy 21308 ASA CLASS:  Class III INDICATIONS:  father with colon cancer, chronic constipation MEDICATIONS:   MAC sedation, administered by CRNA, propofol (Diprivan) 250 mcg IV  DESCRIPTION OF PROCEDURE:   After the risks benefits and alternatives of the procedure were thoroughly explained, informed consent was obtained.  Digital rectal exam was performed and revealed no rectal masses.   The LB [id number] endoscope was introduced through the anus and advanced to the cecum, which was identified by both the appendix and ileocecal valve, without limitations.  The quality of the prep was poor..  The instrument was then slowly withdrawn as the colon was fully examined. <<PROCEDUREIMAGES>> FINDINGS:  Poor prep (solid and liquid stool) limited this examination (see image2, image3, image5, and image6).  This was otherwise a normal examination of the colon.   Retroflexed views in the rectum revealed no abnormalities. COMPLICATIONS:  None  ENDOSCOPIC IMPRESSION: 1) Poor prep (exam limited) 2) Otherwise normal examination (no obstructing lesions but small, significant lesions could have been missed)  RECOMMENDATIONS: Dr. Christella Hartigan' office will get in touch to schedule repeat colonoscopy in next few weeks (with MAC at [facility]) with "double prep."  ______________________________ Rachael Fee, MD  n. eSIGNED:   Rachael Fee at 10/24/2011 08:45 AM  Ritta Slot, [phone]

## 2011-10-24 NOTE — Progress Notes (Signed)
Patient did not have preoperative order for IV antibiotic SSI prophylaxis. (G8918)  Patient did not experience any of the following events: a burn prior to discharge; a fall within the facility; wrong site/side/patient/procedure/implant event; or a hospital transfer or hospital admission upon discharge from the facility. (G8907)  

## 2011-10-24 NOTE — Patient Instructions (Signed)
Dr.Jacob's office will call you to set up repeat colonoscopy. Thank you!!   YOU HAD AN ENDOSCOPIC PROCEDURE TODAY AT THE [facility]: Refer to the procedure report that was given to you for any specific questions about what was found during the examination.  If the procedure report does not answer your questions, please call your gastroenterologist to clarify.  If you requested that your care partner not be given the details of your procedure findings, then the procedure report has been included in a sealed envelope for you to review at your convenience later.  YOU SHOULD EXPECT: Some feelings of bloating in the abdomen. Passage of more gas than usual.  Walking can help get rid of the air that was put into your GI tract during the procedure and reduce the bloating. If you had a lower endoscopy (such as a colonoscopy or flexible sigmoidoscopy) you may notice spotting of blood in your stool or on the toilet paper. If you underwent a bowel prep for your procedure, then you may not have a normal bowel movement for a few days.  DIET: Your first meal following the procedure should be a light meal and then it is ok to progress to your normal diet.  A half-sandwich or bowl of soup is an example of a good first meal.  Heavy or fried foods are harder to digest and may make you feel nauseous or bloated.  Likewise meals heavy in dairy and vegetables can cause extra gas to form and this can also increase the bloating.  Drink plenty of fluids but you should avoid alcoholic beverages for 24 hours.  ACTIVITY: Your care partner should take you home directly after the procedure.  You should plan to take it easy, moving slowly for the rest of the day.  You can resume normal activity the day after the procedure however you should NOT DRIVE or use heavy machinery for 24 hours (because of the sedation medicines used during the test).    SYMPTOMS TO REPORT IMMEDIATELY: A gastroenterologist can be reached at any  hour.  During normal business hours, 8:30 AM to 5:00 PM Monday through Friday, call [phone].  After hours and on weekends, please call the GI answering service at [phone] who will take a message and have the physician on call contact you.   Following lower endoscopy (colonoscopy or flexible sigmoidoscopy):  Excessive amounts of blood in the stool  Significant tenderness or worsening of abdominal pains  Swelling of the abdomen that is new, acute  Fever of 100F or higher  FOLLOW UP: If any biopsies were taken you will be contacted by phone or by letter within the next 1-3 weeks.  Call your gastroenterologist if you have not heard about the biopsies in 3 weeks.  Our staff will call the home number listed on your records the next business day following your procedure to check on you and address any questions or concerns that you may have at that time regarding the information given to you following your procedure. This is a courtesy call and so if there is no answer at the home number and we have not heard from you through the emergency physician on call, we will assume that you have returned to your regular daily activities without incident.  SIGNATURES/CONFIDENTIALITY: You and/or your care partner have signed paperwork which will be entered into your electronic medical record.  These signatures attest to the fact that that the information above on your After Visit Summary  has been reviewed and is understood.  Full responsibility of the confidentiality of this discharge information lies with you and/or your care-partner.  

## 2011-10-26 ENCOUNTER — Encounter (HOSPITAL_BASED_OUTPATIENT_CLINIC_OR_DEPARTMENT_OTHER): Payer: Self-pay | Admitting: Emergency Medicine

## 2011-10-26 ENCOUNTER — Emergency Department (HOSPITAL_BASED_OUTPATIENT_CLINIC_OR_DEPARTMENT_OTHER)
Admission: EM | Admit: 2011-10-26 | Discharge: 2011-10-26 | Disposition: A | Discharge status: Home / Self Care | Payer: Medicare Other | Attending: Emergency Medicine | Admitting: Emergency Medicine

## 2011-10-26 DIAGNOSIS — F3189 Other bipolar disorder: Secondary | ICD-10-CM | POA: Insufficient documentation

## 2011-10-26 DIAGNOSIS — F411 Generalized anxiety disorder: Secondary | ICD-10-CM | POA: Insufficient documentation

## 2011-10-26 DIAGNOSIS — R110 Nausea: Secondary | ICD-10-CM | POA: Insufficient documentation

## 2011-10-26 DIAGNOSIS — M5430 Sciatica, unspecified side: Secondary | ICD-10-CM | POA: Insufficient documentation

## 2011-10-26 DIAGNOSIS — F1910 Other psychoactive substance abuse, uncomplicated: Secondary | ICD-10-CM | POA: Insufficient documentation

## 2011-10-26 MED ORDER — METHOCARBAMOL 500 MG PO TABS: 500.0000 mg | ORAL_TABLET | Freq: Two times a day (BID) | ORAL | Status: AC

## 2011-10-26 MED ORDER — ONDANSETRON HCL 4 MG/2ML IJ SOLN
4.0000 mg | Freq: Once | INTRAMUSCULAR | Status: AC
  Administered 2011-10-26: 4 mg via INTRAMUSCULAR
  Filled 2011-10-26: qty 2

## 2011-10-26 MED ORDER — HYDROMORPHONE HCL PF 1 MG/ML IJ SOLN
2.0000 mg | Freq: Once | INTRAMUSCULAR | Status: AC
  Administered 2011-10-26: 2 mg via INTRAMUSCULAR
  Filled 2011-10-26: qty 2

## 2011-10-26 MED ORDER — OXYCODONE-ACETAMINOPHEN 5-325 MG PO TABS: 2.0000 | ORAL_TABLET | ORAL | Status: AC | PRN

## 2011-10-26 NOTE — ED Provider Notes (Signed)
Medical screening examination/treatment/procedure(s) were performed by non-physician practitioner and as supervising physician I was immediately available for consultation/collaboration.   Florita Nitsch B. Bernette Mayers, MD 10/26/11 1438

## 2011-10-26 NOTE — ED Notes (Signed)
Pt c/o "siatic" pain to bilateral legs, stating that left leg is the worse x 1 day. Pt had colonoscopy on Friday, and feels like these pains are related. Pt c/o nausea x 1 day. Last normal BM Friday.

## 2011-10-26 NOTE — ED Notes (Signed)
Pt c/o lower back pain since Friday.  Pt had colonoscopy and was on the table for a while.  Pt having nausea also.

## 2011-10-26 NOTE — ED Provider Notes (Addendum)
History     CSN: [id number]  Arrival date & time 10/26/11  1335   First MD Initiated Contact with Patient 10/26/11 1418      Chief Complaint  Patient presents with  . Back Pain  . Nausea    (Consider location/radiation/quality/duration/timing/severity/associated sxs/prior treatment) Patient is a 46 y.o. female presenting with back pain. The history is provided by the patient. No language interpreter was used.  Back Pain  This is a new problem. The current episode started 2 days ago. The problem occurs constantly. The problem has been gradually worsening. The pain is associated with no known injury (colonscopy). The pain is present in the lumbar spine. The quality of the pain is described as shooting. The pain is severe. Pertinent negatives include no abdominal pain. She has tried nothing for the symptoms. The treatment provided no relief.   Pt complains of lower back pain and sciatica   Pt has had sciatica in the past. Pt reports this is the same.  Pt thinks it may be from the position she was in for colonoscopy.  No abdominal pain. Past Medical History  Diagnosis Date  . Dysrhythmia     Left sided heart diease  . Mental disorder   . Bipolar affective disorder, mixed, in partial or remission   . Headache   . Anxiety   . Renal disorder   . Kidney stones   . Depression   . 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 History of gallstones     Past Surgical History  Procedure Date  . Vaginal hysterectomy   . Cholecystectomy   . Total abdominal hysterectomy w/ bilateral salpingoophorectomy   . Foot surgery     right x 2  . Colonoscopy     2001, 2008.  Father has colon cancer    Family History  Problem Relation Age of Onset  . Colon cancer Father 73  . Stomach cancer Neg Hx   . Rectal cancer Neg Hx   . Esophageal cancer Neg Hx   . Breast cancer Mother   . Breast cancer Father   . Uterine cancer Mother     History  Substance Use Topics  . Smoking  status: Never Smoker   . Smokeless tobacco: Never Used  . Alcohol Use: No    OB History    Grav Para Term Preterm Abortions TAB SAB Ect Mult Living                  Review of Systems  Gastrointestinal: Negative for abdominal pain.  Musculoskeletal: Positive for back pain.  All other systems reviewed and are negative.    Allergies  Review of patient's allergies indicates no known allergies.  Home Medications   Current Outpatient Rx  Name Route Sig Dispense Refill  . CLINDAMYCIN HCL 300 MG PO CAPS Oral Take 1 capsule (300 mg total) by mouth 4 (four) times daily. 40 capsule 0  . HYDROXYZINE PAMOATE 25 MG PO CAPS Oral Take 25 mg by mouth at bedtime.    Marland Kitchen LITHIUM CARBONATE ER 300 MG PO TBCR Oral Take 300 mg by mouth daily.    Marland Kitchen METHOCARBAMOL 500 MG PO TABS Oral Take 500 mg by mouth 4 (four) times daily. for foot pain.    . OXYCODONE-ACETAMINOPHEN 5-325 MG PO TABS  1 to 2 tablets every 6 hours as needed for pain. 20 tablet 0  . PROMETHAZINE HCL 25 MG PO TABS Oral Take 1 tablet (25  mg total) by mouth every 6 (six) hours as needed for nausea. 20 tablet 0  . QUETIAPINE FUMARATE 400 MG PO TABS Oral Take 800 mg by mouth at bedtime.     . TRAZODONE HCL 100 MG PO TABS Oral Take 400 mg by mouth at bedtime.    Marland Kitchen ZOLPIDEM TARTRATE 10 MG PO TABS Oral Take 10 mg by mouth at bedtime. For sleep      BP 115/78  Pulse 99  Temp 98.3 F (36.8 C) (Oral)  Resp 16  Ht 5\' 6"  (1.676 m)  Wt 195 lb (88.451 kg)  BMI 31.47 kg/m2  SpO2 98%  Physical Exam  Nursing note and vitals reviewed. Constitutional: She is oriented to person, place, and time. She appears well-developed and well-nourished.  HENT:  Head: Normocephalic and atraumatic.  Eyes: Conjunctivae are normal. Pupils are equal, round, and reactive to light.  Neck: Normal range of motion.  Cardiovascular: Regular rhythm.   Pulmonary/Chest: Effort normal.  Abdominal: Soft.  Musculoskeletal: She exhibits tenderness.       Calf non tender,  negative homans.  Spine non tender,  Decreased range of motion  Neurological: She is alert and oriented to person, place, and time. She has normal reflexes.  Skin: Skin is warm.  Psychiatric: She has a normal mood and affect.    ED Course  Procedures (including critical care time)  Labs Reviewed - No data to display No results found.   1. Sciatica       MDM  Pt given injection here.   Pt given rx for robaxin and Percocet.   Pt advised she needs to see her Md for recheck in 2-3 days        Elson Areas, [place] 10/26/11 1438  Lonia Skinner Pierce, [place] 10/26/11 1441

## 2011-10-27 ENCOUNTER — Telehealth: Payer: Self-pay | Admitting: *Deleted

## 2011-10-27 ENCOUNTER — Telehealth: Payer: Self-pay

## 2011-10-27 NOTE — Telephone Encounter (Signed)
Dr. Christella Hartigan' office will get in touch to schedule repeat  colonoscopy in next few weeks (with MAC at [facility]) with "double  prep."

## 2011-10-27 NOTE — Telephone Encounter (Signed)
  Follow up Call-  Call back number 10/24/2011  Post procedure Call Back phone  # [phone]  Permission to leave phone message Yes     Patient questions:  Message left to call us if necessary,

## 2011-10-28 NOTE — Telephone Encounter (Signed)
No answer and no voice mail.

## 2011-10-29 NOTE — Telephone Encounter (Signed)
No answer and no voice mail.

## 2011-10-31 NOTE — Telephone Encounter (Signed)
No answer and no voicemail. Letter mailed.

## 2011-11-05 ENCOUNTER — Telehealth: Payer: Self-pay | Admitting: Gastroenterology

## 2011-11-10 NOTE — Telephone Encounter (Signed)
Left message on machine to call back  

## 2011-11-12 NOTE — Telephone Encounter (Signed)
Left message on machine to call back Pt has been notified by letter to contact our office.

## 2011-11-13 ENCOUNTER — Telehealth: Payer: Self-pay | Admitting: Gastroenterology

## 2011-11-18 NOTE — Telephone Encounter (Signed)
Left message on machine to call back  

## 2011-11-19 NOTE — Telephone Encounter (Signed)
Left message on machine to call back will await further communication from the pt  

## 2011-11-24 ENCOUNTER — Emergency Department (INDEPENDENT_AMBULATORY_CARE_PROVIDER_SITE_OTHER)
Admission: EM | Admit: 2011-11-24 | Discharge: 2011-11-24 | Disposition: A | Discharge status: Home / Self Care | Payer: Medicare Other | Source: Home / Self Care | Attending: Emergency Medicine | Admitting: Emergency Medicine

## 2011-11-24 ENCOUNTER — Encounter (HOSPITAL_COMMUNITY): Payer: Self-pay

## 2011-11-24 DIAGNOSIS — G5620 Lesion of ulnar nerve, unspecified upper limb: Secondary | ICD-10-CM

## 2011-11-24 MED ORDER — PREDNISONE (PAK) 10 MG PO TABS: ORAL_TABLET | ORAL | Status: AC

## 2011-11-24 MED ORDER — DICLOFENAC SODIUM 1 % TD GEL: 1.0000 "application " | Freq: Four times a day (QID) | TRANSDERMAL | Status: DC

## 2011-11-24 NOTE — ED Provider Notes (Signed)
History     CSN: [id number]  Arrival date & time 11/24/11  1611   First MD Initiated Contact with Patient 11/24/11 1646      Chief Complaint  Patient presents with  . Sciatica    (Consider location/radiation/quality/duration/timing/severity/associated sxs/prior treatment) HPI Comments: Patient presents with paresthesias in her left forearm, left lateral hand over the past week. States her symptoms started after having bunion surgery. She is currently in a bulky cast, and finds herself waking up lying on her left arm. 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 She does note that she spends a moderate time with her head propped up on her left elbow. No grip weakness. No arm or hand swelling, color changes. No recent history of trauma to her neck, shoulder, arm, elbow. She is not a diabetic or smoker. She is currently taking Robaxin, Percocet 5/325 for her foot, which she states helps also with hand. States that she is unable to take NSAIDs because she is on lithium.  ROS as noted in HPI. All other ROS negative.   Patient is a 46 y.o. female presenting with hand pain. The history is provided by the patient. No language interpreter was used.  Hand Pain This is a new problem. The current episode started more than 1 week ago. The problem occurs daily. The problem has not changed since onset.   Past Medical History  Diagnosis Date  . Dysrhythmia     Left sided heart diease  . Mental disorder   . Bipolar affective disorder, mixed, in partial or remission   . Headache   . Anxiety   . Renal disorder   . Kidney stones   . Depression   . 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 History of gallstones     Past Surgical History  Procedure Date  . Vaginal hysterectomy   . Cholecystectomy   . Total abdominal hysterectomy w/ bilateral salpingoophorectomy   . Foot surgery     right x 2  . Colonoscopy     2001, 2008.   Father has colon cancer  . Bunionectomy     11/14/2011    Family History  Problem Relation Age of Onset  . Colon cancer Father 40  . Stomach cancer Neg Hx   . Rectal cancer Neg Hx   . Esophageal cancer Neg Hx   . Breast cancer Mother   . Breast cancer Father   . Uterine cancer Mother     History  Substance Use Topics  . Smoking status: Never Smoker   . Smokeless tobacco: Never Used  . Alcohol Use: No    OB History    Grav Para Term Preterm Abortions TAB SAB Ect Mult Living                  Review of Systems  Allergies  Review of patient's allergies indicates no known allergies.  Home Medications   Current Outpatient Rx  Name Route Sig Dispense Refill  . LITHIUM CARBONATE ER 300 MG PO TBCR Oral Take 300 mg by mouth daily.    . OXYCODONE-ACETAMINOPHEN 5-325 MG PO TABS Oral Take 1 tablet by mouth every 4 (four) hours as needed.    . TRAZODONE HCL 100 MG PO TABS Oral Take 400 mg by mouth at bedtime.    Marland Kitchen ZOLPIDEM TARTRATE 10 MG PO TABS Oral Take 10 mg by mouth at bedtime. For  sleep    . DICLOFENAC SODIUM 1 % TD GEL Topical Apply 1 application topically 4 (four) times daily. 100 g 0  . HYDROXYZINE PAMOATE 25 MG PO CAPS Oral Take 25 mg by mouth at bedtime.    . METHOCARBAMOL 500 MG PO TABS Oral Take 500 mg by mouth 4 (four) times daily. for foot pain.    Marland Kitchen PREDNISONE (PAK) 10 MG PO TABS  Dispense one 6 day pack. Take as directed with food. 21 tablet 0  . PROMETHAZINE HCL 25 MG PO TABS Oral Take 1 tablet (25 mg total) by mouth every 6 (six) hours as needed for nausea. 20 tablet 0  . QUETIAPINE FUMARATE 400 MG PO TABS Oral Take 800 mg by mouth at bedtime.       BP 99/70  Pulse 98  Temp 98.2 F (36.8 C) (Oral)  Resp 12  SpO2 100%  Physical Exam  Nursing note and vitals reviewed. Constitutional: She is oriented to person, place, and time. She appears well-developed and well-nourished. No distress.  HENT:  Head: Normocephalic and atraumatic.  Eyes: Conjunctivae and  EOM are normal.  Neck: Normal range of motion.  Cardiovascular: Normal rate.   Pulmonary/Chest: Effort normal.  Abdominal: She exhibits no distension.  Musculoskeletal: Normal range of motion.       Left hand: Normal. She exhibits normal range of motion, no tenderness, no bony tenderness and normal two-point discrimination. normal sensation noted. Normal strength noted.       Tinel, phalen neg. Left hand.  +  tenderness at ulnar groove. Grip strength equal bilaterally. No  signs of trauma. RP 2+   Neurological: She is alert and oriented to person, place, and time. She has normal strength. No sensory deficit. Coordination normal.  Reflex Scores:      Brachioradialis reflexes are 2+ on the right side and 2+ on the left side. Skin: Skin is warm and dry.  Psychiatric: She has a normal mood and affect. Her behavior is normal. Judgment and thought content normal.    ED Course  Procedures (including critical care time)  Labs Reviewed - No data to display No results found.   1. Ulnar neuropathy     MDM  Previous records reviewed. [place] narcotic database reviewed. Patient Rx'd Percocet 5/325 #40 on 8/30.  Triage no reviewed. Patient's primary complaint today was the left arm pain. We did not discuss her left hip pain.  Presentation is consistent with an ulnar neuropathy, ulnar compression syndrome. Start her on some steroids, activity modification. Unable to give systemic nsaids as patient is on lithium will try topical diclofenac. Will have her followup with Dr. Mina Marble, hand on call in 10 days, if no improvement with conservative therapy.  Luiz Blare, MD 11/25/11 [phone]

## 2011-11-24 NOTE — ED Notes (Signed)
C/o pain in her left hip, left arm; recent bunion surgery , thinks her syx are related to her changes in activity since surgery

## 2011-11-25 ENCOUNTER — Encounter: Payer: Self-pay | Admitting: Internal Medicine

## 2011-11-25 ENCOUNTER — Ambulatory Visit (INDEPENDENT_AMBULATORY_CARE_PROVIDER_SITE_OTHER): Payer: No Typology Code available for payment source | Admitting: Internal Medicine

## 2011-11-25 VITALS — BP 102/68 | HR 102 | Temp 98.1°F | Resp 16 | Wt 199.2 lb

## 2011-11-25 DIAGNOSIS — G43909 Migraine, unspecified, not intractable, without status migrainosus: Secondary | ICD-10-CM

## 2011-11-25 MED ORDER — PROMETHAZINE HCL 25 MG PO TABS: 25.0000 mg | ORAL_TABLET | Freq: Four times a day (QID) | ORAL | Status: DC | PRN

## 2011-11-25 MED ORDER — SUMATRIPTAN SUCCINATE 50 MG PO TABS: 50.0000 mg | ORAL_TABLET | ORAL | Status: DC | PRN

## 2011-11-29 DIAGNOSIS — G43909 Migraine, unspecified, not intractable, without status migrainosus: Secondary | ICD-10-CM | POA: Insufficient documentation

## 2011-11-29 NOTE — Progress Notes (Signed)
  Subjective:    Patient ID: [name], female    DOB: [birth year], 46 y.o.   MRN: [id number]  HPI patient presents to clinic for  evaluation of migraine headache. Seen in the emergency department yesterday with left arm pain felt to be possible ulnar nerve neuropathy. Given prednisone and Voltaren gel. States development of nausea with migraine headache since yesterday with no neurologic features. Previously tolerated Imitrex but not currently taking. Requested narcotic medication. States is scheduled to see the pain clinic next week. No alleviating or exacerbating factors  Past Medical History  Diagnosis Date  . Dysrhythmia     Left sided heart diease  . Mental disorder   . Bipolar affective disorder, mixed, in partial or remission   . Headache   . Anxiety   . Renal disorder   . Kidney stones   . Depression   . 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 History of gallstones    Past Surgical History  Procedure Date  . Vaginal hysterectomy   . Cholecystectomy   . Total abdominal hysterectomy w/ bilateral salpingoophorectomy   . Foot surgery     right x 2  . Colonoscopy     2001, 2008.  Father has colon cancer  . Bunionectomy     11/14/2011    reports that she has never smoked. She has never used smokeless tobacco. She reports that she does not drink alcohol or use illicit drugs. family history includes Breast cancer in her father and mother; Colon cancer (age of onset:52) in her father; and Uterine cancer in her mother.  There is no history of Stomach cancer, and Rectal cancer, and Esophageal cancer, . No Known Allergies   Review of Systems see history of present illness     Objective:   Physical Exam  Constitutional: She appears well-developed and well-nourished. No distress.  Neurological: She is alert.  Skin: She is not diaphoretic.  Psychiatric: She has a normal mood and affect.          Assessment & Plan:

## 2011-11-29 NOTE — Assessment & Plan Note (Signed)
Informed patient clinic does not keep injectable narcotics. Do not recommend oral narcotics. Avoid anti-inflammatories do to lithium use. Attempt Imitrex when necessary.

## 2011-12-06 ENCOUNTER — Ambulatory Visit (INDEPENDENT_AMBULATORY_CARE_PROVIDER_SITE_OTHER): Payer: Medicare Other | Admitting: Internal Medicine

## 2011-12-06 VITALS — BP 101/79 | HR 84 | Temp 98.1°F | Resp 16

## 2011-12-06 DIAGNOSIS — M545 Low back pain, unspecified: Secondary | ICD-10-CM

## 2011-12-06 DIAGNOSIS — M461 Sacroiliitis, not elsewhere classified: Secondary | ICD-10-CM

## 2011-12-06 MED ORDER — PROMETHAZINE HCL 25 MG PO TABS: 25.0000 mg | ORAL_TABLET | Freq: Four times a day (QID) | ORAL | Status: DC | PRN

## 2011-12-06 MED ORDER — PREDNISONE 20 MG PO TABS: ORAL_TABLET | ORAL | Status: DC

## 2011-12-06 MED ORDER — OXYCODONE-ACETAMINOPHEN 5-325 MG PO TABS: 1.0000 | ORAL_TABLET | Freq: Four times a day (QID) | ORAL | Status: DC | PRN

## 2011-12-06 NOTE — Progress Notes (Signed)
  Subjective:    Patient ID: [name], female    DOB: 1965-09-27, 46 y.o.   MRN: [id number]  HPIFor much of the past 2-3 months she has been bed ridden for surgery with Dr. 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 with her left foot in a cast She has had progressive problems with pain in her left sacroiliac area radiating down the left leg to the foot No sensory or motor losses Past history of sacroiliac disease No GU symptoms/no fever/no weakness   Multiple medical problems and psychiatric problems-psychiatrist in [place]/she feels all these problems are stable at this point Current outpatient prescriptions:diclofenac sodium (VOLTAREN) 1 % GEL, Apply 1 application topically 4 (four) times daily., Disp: 100 g, Rfl: 0;  hydrOXYzine (VISTARIL) 25 MG capsule, Take 25 mg by mouth at bedtime., Disp: , Rfl: ;  lithium carbonate (LITHOBID) 300 MG CR tablet, Take 300 mg by mouth daily., Disp: , Rfl: ;  methocarbamol (ROBAXIN) 500 MG tablet, Take 500 mg by mouth 4 (four) times daily. for foot pain., Disp: , Rfl:  QUEtiapine (SEROQUEL) 400 MG tablet, Take 800 mg by mouth at bedtime. , Disp: , Rfl: ;  SUMAtriptan (IMITREX) 50 MG tablet, Take 1 tablet (50 mg total) by mouth every 2 (two) hours as needed for migraine., Disp: 10 tablet, Rfl: 1;  traZODone (DESYREL) 100 MG tablet, Take 400 mg by mouth at bedtime., Disp: , Rfl: ;  zolpidem (AMBIEN) 10 MG tablet, Take 10 mg by mouth at bedtime. For sleep, Disp: , Rfl:  oxyCODONE-acetaminophen (PERCOCET/ROXICET) 5-325 MG per tablet, Take 1 tablet by mouth every 6 (six) hours as needed., Disp: 30 tablet, Rfl: 0;     Review of Systems Negative GI and GU symptoms Except for the presence of nausea when she takes medications for her back problems    Objective:   Physical Exam Wheelchair-bound Left leg in a plaster cast Lumbosacral area is tender on the left over the SI joint and iliac  crest Somewhat tender in the midline and on the right as well Straight leg raise is negative on the left than the right at 90 with pain in the left lumbar area Left radial leg raise increases radicular pain in the left posterior thigh Deep tendon reflexes are 1+ and symmetrical No sensory losses       Assessment & Plan:  Problem #1 left sacroilitis #2 radicular low back pain  Meds ordered this encounter  Medications  . oxyCODONE-acetaminophen (PERCOCET/ROXICET) 5-325 MG per tablet    Sig: Take 1 tablet by mouth every 6 (six) hours as needed.    Dispense:  30 tablet    Refill:  0  . predniSONE (DELTASONE) 20 MG tablet    Sig: 4/3/3/2/2/1/1 single daily dose for 7 days    Dispense:  16 tablet    Refill:  0  . promethazine (PHENERGAN) 25 MG tablet    Sig: Take 1 tablet (25 mg total) by mouth every 6 (six) hours as needed.    Dispense:  20 tablet    Refill:  1

## 2012-01-01 ENCOUNTER — Ambulatory Visit: Payer: Self-pay | Admitting: Family Medicine

## 2012-01-02 ENCOUNTER — Emergency Department (HOSPITAL_BASED_OUTPATIENT_CLINIC_OR_DEPARTMENT_OTHER)
Admission: EM | Admit: 2012-01-02 | Discharge: 2012-01-02 | Disposition: A | Discharge status: Home / Self Care | Payer: Medicare Other | Attending: Emergency Medicine | Admitting: Emergency Medicine

## 2012-01-02 ENCOUNTER — Encounter (HOSPITAL_BASED_OUTPATIENT_CLINIC_OR_DEPARTMENT_OTHER): Payer: Self-pay | Admitting: *Deleted

## 2012-01-02 DIAGNOSIS — R070 Pain in throat: Secondary | ICD-10-CM | POA: Insufficient documentation

## 2012-01-02 DIAGNOSIS — F319 Bipolar disorder, unspecified: Secondary | ICD-10-CM | POA: Insufficient documentation

## 2012-01-02 DIAGNOSIS — G43909 Migraine, unspecified, not intractable, without status migrainosus: Secondary | ICD-10-CM | POA: Insufficient documentation

## 2012-01-02 DIAGNOSIS — F411 Generalized anxiety disorder: Secondary | ICD-10-CM | POA: Insufficient documentation

## 2012-01-02 DIAGNOSIS — J029 Acute pharyngitis, unspecified: Secondary | ICD-10-CM

## 2012-01-02 LAB — RAPID STREP SCREEN (MED CTR MEBANE ONLY): Streptococcus, Group A Screen (Direct): NEGATIVE

## 2012-01-02 MED ORDER — ONDANSETRON 4 MG PO TBDP
4.0000 mg | ORAL_TABLET | Freq: Once | ORAL | Status: AC
Start: 2012-01-02 — End: 2012-01-02
  Administered 2012-01-02: 4 mg via ORAL
  Filled 2012-01-02: qty 1

## 2012-01-02 MED ORDER — KETOROLAC TROMETHAMINE 60 MG/2ML IM SOLN
60.0000 mg | Freq: Once | INTRAMUSCULAR | Status: DC
  Filled 2012-01-02: qty 2

## 2012-01-02 MED ORDER — KETOROLAC TROMETHAMINE 60 MG/2ML IM SOLN
30.0000 mg | Freq: Once | INTRAMUSCULAR | Status: AC
  Administered 2012-01-02: 30 mg via INTRAMUSCULAR

## 2012-01-02 MED ORDER — PROMETHAZINE HCL 25 MG PO TABS
12.5000 mg | ORAL_TABLET | Freq: Once | ORAL | Status: AC
  Administered 2012-01-02: 12.5 mg via ORAL
  Filled 2012-01-02: qty 1

## 2012-01-02 MED ORDER — SUMATRIPTAN SUCCINATE 6 MG/0.5ML ~~LOC~~ SOLN
6.0000 mg | Freq: Once | SUBCUTANEOUS | Status: AC
  Administered 2012-01-02: 6 mg via SUBCUTANEOUS
  Filled 2012-01-02: qty 0.5

## 2012-01-02 NOTE — ED Provider Notes (Signed)
History     CSN: [id number]  Arrival date & time 01/02/12  1300   First MD Initiated Contact with Patient 01/02/12 1315      Chief Complaint  Patient presents with  . Migraine  . Sore Throat    (Consider location/radiation/quality/duration/timing/severity/associated sxs/prior treatment) HPI Comments: 46 year old female presents the emergency department complaining of a sore throat since yesterday morning. States she took Percocet which she is on for her foot since she had foot surgery, and that relieved her throat pain. Admits to associated sudden onset migraine that woke her up from sleep this morning. States she normally gets 2 migraines per year. She has Imitrex for migraines, however she did not take it today. The Percocet she took for her sore throat did not relieve her pain from her migraine. Migraine is located on the right side of her head rated 7/10. States she has "blotches" in her vision. Admits to associated nausea without vomiting. Denies lightheadedness, dizziness, blurred or double vision, fever or chills, cough, chest pain, shortness of breath, nasal congestion or ear pain. When asked what she normally takes for her migraines, her response was "Phenergan and Dilaudid". I then asked her if she takes these at home, and then she says "oh no, only when I go to the ER".  Patient is a 46 y.o. female presenting with migraines and pharyngitis. The history is provided by the patient.  Migraine Associated symptoms include headaches, nausea and a sore throat. Pertinent negatives include no arthralgias, chest pain, chills, congestion, fever, neck pain, rash, vomiting or weakness.  Sore Throat Associated symptoms include headaches, nausea and a sore throat. Pertinent negatives include no arthralgias, chest pain, chills, congestion, fever, neck pain, rash, vomiting or weakness.    Past Medical History  Diagnosis Date  . Dysrhythmia     Left sided heart diease  . Mental disorder   .  Bipolar affective disorder, mixed, in partial or remission   . Headache   . Anxiety   . Renal disorder   . Kidney stones   . Depression   . 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 History of gallstones     Past Surgical History  Procedure Date  . Vaginal hysterectomy   . Cholecystectomy   . Total abdominal hysterectomy w/ bilateral salpingoophorectomy   . Foot surgery     right x 2  . Colonoscopy     2001, 2008.  Father has colon cancer  . Bunionectomy     11/14/2011    Family History  Problem Relation Age of Onset  . Colon cancer Father 24  . Stomach cancer Neg Hx   . Rectal cancer Neg Hx   . Esophageal cancer Neg Hx   . Breast cancer Mother   . Breast cancer Father   . Uterine cancer Mother     History  Substance Use Topics  . Smoking status: Never Smoker   . Smokeless tobacco: Never Used  . Alcohol Use: No    OB History    Grav Para Term Preterm Abortions TAB SAB Ect Mult Living                  Review of Systems  Constitutional: Negative for fever and chills.  HENT: Positive for sore throat. Negative for ear pain, congestion, rhinorrhea, neck pain, neck stiffness and sinus pressure.   Eyes: Positive for photophobia (mild) and visual disturbance.  Respiratory: Negative for shortness of breath.   Cardiovascular:  Negative for chest pain.  Gastrointestinal: Positive for nausea. Negative for vomiting.  Musculoskeletal: Negative for arthralgias.  Skin: Negative for rash.  Neurological: Positive for headaches. Negative for dizziness, weakness and light-headedness.  Psychiatric/Behavioral: Negative for confusion.    Allergies  Review of patient's allergies indicates no known allergies.  Home Medications   Current Outpatient Rx  Name Route Sig Dispense Refill  . DICLOFENAC SODIUM 1 % TD GEL Topical Apply 1 application topically 4 (four) times daily. 100 g 0  . HYDROXYZINE PAMOATE 25 MG PO CAPS Oral Take 25 mg by mouth at bedtime.    Marland Kitchen  LITHIUM CARBONATE ER 300 MG PO TBCR Oral Take 300 mg by mouth daily.    Marland Kitchen METHOCARBAMOL 500 MG PO TABS Oral Take 500 mg by mouth 4 (four) times daily. for foot pain.    . OXYCODONE-ACETAMINOPHEN 5-325 MG PO TABS Oral Take 1 tablet by mouth every 6 (six) hours as needed. 30 tablet 0  . PREDNISONE 20 MG PO TABS  4/3/3/2/2/1/1 single daily dose for 7 days 16 tablet 0  . PROMETHAZINE HCL 25 MG PO TABS Oral Take 1 tablet (25 mg total) by mouth every 6 (six) hours as needed. 20 tablet 1  . QUETIAPINE FUMARATE 400 MG PO TABS Oral Take 800 mg by mouth at bedtime.     . SUMATRIPTAN SUCCINATE 50 MG PO TABS Oral Take 1 tablet (50 mg total) by mouth every 2 (two) hours as needed for migraine. 10 tablet 1  . TRAZODONE HCL 100 MG PO TABS Oral Take 400 mg by mouth at bedtime.    Marland Kitchen ZOLPIDEM TARTRATE 10 MG PO TABS Oral Take 10 mg by mouth at bedtime. For sleep      BP 103/82  Pulse 88  Temp 97.9 F (36.6 C) (Oral)  Resp 16  Ht 5\' 6"  (1.676 m)  Wt 200 lb (90.719 kg)  BMI 32.28 kg/m2  SpO2 99%  Physical Exam  Constitutional: She is oriented to person, place, and time. She appears well-developed and well-nourished. No distress.  HENT:  Head: Normocephalic and atraumatic.  Right Ear: Tympanic membrane, external ear and ear canal normal.  Left Ear: Tympanic membrane, external ear and ear canal normal.  Nose: Nose normal.  Mouth/Throat: Oropharynx is clear and moist and mucous membranes are normal. No oropharyngeal exudate, posterior oropharyngeal edema or posterior oropharyngeal erythema.  Eyes: Conjunctivae normal and EOM are normal. Pupils are equal, round, and reactive to light.  Neck: Normal range of motion. Neck supple.  Cardiovascular: Normal rate, regular rhythm, normal heart sounds and intact distal pulses.   Pulmonary/Chest: Effort normal and breath sounds normal. She has no wheezes.  Abdominal: Soft. Bowel sounds are normal. There is no tenderness.  Musculoskeletal: Normal range of motion. She  exhibits no edema.  Neurological: She is alert and oriented to person, place, and time. She has normal strength. No cranial nerve deficit or sensory deficit.       Unable to ambulate due to cast on left foot/leg. Point-to-point intact  Skin: Skin is warm and dry. No rash noted.  Psychiatric: Her speech is normal and behavior is normal. Her affect is blunt.    ED Course  Procedures (including critical care time)   Labs Reviewed  RAPID STREP SCREEN   No results found.   1. Migraine   2. Sore throat       MDM  46 year old female presenting to emergency department complaining of a sore throat and migraine. Rapid strep negative.  IM Toradol and Zofran were given, and patient states she did not receive any relief with these. After she requested Dilaudid, I looked through her chart and saw the note from the clinic stating that she does not need IV narcotics for her migraines, and was advised to try Imitrex when possible. Imitrex given, along with one PO phenergan. She will be discharged. No red flags concerning patient's headache. No focal neuro deficits. Case discussed with Dr. Rulon Abide who agrees with plan of care. Saltwater gargles suggested for sore throat.        Trevor Mace, PA-C 01/02/12 1447

## 2012-01-02 NOTE — ED Notes (Signed)
Pt states that she feel like she may have strept throat sore throat started yesterday today she woke up with a migraine and nausea no vomiting

## 2012-01-02 NOTE — ED Provider Notes (Signed)
Medical screening examination/treatment/procedure(s) were performed by non-physician practitioner and as supervising physician I was immediately available for consultation/collaboration.  John-Adam Navy Belay, M.D.     John-Adam Josee Speece, MD 01/02/12 1652 

## 2012-01-04 ENCOUNTER — Ambulatory Visit (INDEPENDENT_AMBULATORY_CARE_PROVIDER_SITE_OTHER): Payer: No Typology Code available for payment source | Admitting: Family Medicine

## 2012-01-04 ENCOUNTER — Encounter: Payer: Self-pay | Admitting: Family Medicine

## 2012-01-04 VITALS — BP 96/65 | HR 104 | Temp 98.6°F | Resp 16

## 2012-01-04 DIAGNOSIS — R110 Nausea: Secondary | ICD-10-CM

## 2012-01-04 DIAGNOSIS — J029 Acute pharyngitis, unspecified: Secondary | ICD-10-CM

## 2012-01-04 MED ORDER — AMOXICILLIN 875 MG PO TABS: 875.0000 mg | ORAL_TABLET | Freq: Two times a day (BID) | ORAL | Status: DC

## 2012-01-04 MED ORDER — PROMETHAZINE HCL 25 MG PO TABS: 25.0000 mg | ORAL_TABLET | Freq: Three times a day (TID) | ORAL | Status: DC | PRN

## 2012-01-04 NOTE — Patient Instructions (Signed)

## 2012-01-04 NOTE — Progress Notes (Signed)
@[facility] @   Patient ID: [name] BRISSETTE MRN: [id number], DOB: 04/19/1965, 46 y.o. Date of Encounter: 01/04/2012, 2:42 PM  Primary Physician: Letitia Libra, Ala Dach, MD  Chief Complaint:  Chief Complaint  Patient presents with  . Sore Throat  . Nausea  . cant take deep breath    HPI: 46 y.o. year old female presents with day history of sore throat. Subjective fever and chills. No cough, congestion, rhinorrhea, sinus pressure, otalgia, or headache. Normal hearing. No GI complaints. Able to swallow saliva, but hurts to do so. Decreased appetite secondary to sore throat.   Past Medical History  Diagnosis Date  . Dysrhythmia     Left sided heart diease  . Mental disorder   . Bipolar affective disorder, mixed, in partial or remission   . Headache   . Anxiety   . Renal disorder   . Kidney stones   . Depression   . 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 History of gallstones      Home Meds: Prior to Admission medications   Medication Sig Start Date End Date Taking? Authorizing Provider  diclofenac sodium (VOLTAREN) 1 % GEL Apply 1 application topically 4 (four) times daily. 11/24/11  Yes Luiz Blare, MD  hydrOXYzine (VISTARIL) 25 MG capsule Take 25 mg by mouth at bedtime.   Yes Historical Provider, MD  lithium carbonate (LITHOBID) 300 MG CR tablet Take 300 mg by mouth daily.   Yes Historical Provider, MD  oxyCODONE-acetaminophen (PERCOCET/ROXICET) 5-325 MG per tablet Take 1 tablet by mouth every 6 (six) hours as needed. 12/06/11  Yes Tonye Pearson, MD  QUEtiapine (SEROQUEL) 400 MG tablet Take 800 mg by mouth at bedtime.    Yes Historical Provider, MD  traZODone (DESYREL) 100 MG tablet Take 400 mg by mouth at bedtime.   Yes Historical Provider, MD  zolpidem (AMBIEN) 10 MG tablet Take 10 mg by mouth at bedtime. For sleep   Yes Historical Provider, MD  methocarbamol (ROBAXIN) 500 MG tablet Take 500 mg by mouth 4 (four) times daily. for foot pain.     Historical Provider, MD  predniSONE (DELTASONE) 20 MG tablet 4/3/3/2/2/1/1 single daily dose for 7 days 12/06/11   Tonye Pearson, MD  promethazine (PHENERGAN) 25 MG tablet Take 1 tablet (25 mg total) by mouth every 6 (six) hours as needed. 12/06/11   Tonye Pearson, MD  SUMAtriptan (IMITREX) 50 MG tablet Take 1 tablet (50 mg total) by mouth every 2 (two) hours as needed for migraine. 11/25/11 11/24/12  Edwyna Perfect, MD    Allergies: No Known Allergies  History   Social History  . Marital Status: Married    Spouse Name: N/A    Number of Children: 6  . Years of Education: N/A   Occupational History  . nursing student    Social History Main Topics  . Smoking status: Never Smoker   . Smokeless tobacco: Never Used  . Alcohol Use: No  . Drug Use: No     see triage note  . Sexually Active: No   Other Topics Concern  . 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  2 sons ( 17, 21 )4 daughters ( 44, 60,  43, 11)Smoking Status:  neverDoes Patient Exercise:  noCaffeine use/day:  4-5 beverages daily     Review of Systems: Constitutional: negative for  chills, fever, night sweats or weight changes HEENT: see above Cardiovascular: negative for chest pain or palpitations Respiratory: negative for hemoptysis, wheezing, or shortness of breath Abdominal: negative for abdominal pain, nausea, vomiting or diarrhea Dermatological: negative for rash Neurologic: negative for headache   Physical Exam: Blood pressure 96/65, pulse 104, temperature 98.6 F (37 C), temperature source Oral, resp. rate 16, SpO2 94.00%., There is no height or weight on file to calculate BMI. General: Well developed, well nourished, in no acute distress. Head: Normocephalic, atraumatic, eyes without discharge, sclera non-icteric, nares are patent. Bilateral auditory canals clear, TM's are without perforation,  pearly grey with reflective cone of light bilaterally. No sinus TTP. Oral cavity moist, dentition normal. Posterior pharynx with post nasal drip and mild erythema. No peritonsillar abscess or tonsillar exudate. Neck: Supple. No thyromegaly. Full ROM. No lymphadenopathy. Lungs: Clear bilaterally to auscultation without wheezes, rales, or rhonchi. Breathing is unlabored. Heart: RRR with S1 S2. No murmurs, rubs, or gallops appreciated. Abdomen: Soft, non-tender, non-distended with normoactive bowel sounds. No hepatomegaly. No rebound/guarding. No obvious abdominal masses. Msk:  Strength and tone normal for age. Extremities: No clubbing or cyanosis. No edema. Neuro: Alert and oriented X 3. Moves all extremities spontaneously. CNII-XII grossly in tact. Psych:  Responds to questions appropriately with a normal affect.   Labs:   ASSESSMENT AND PLAN:  46 y.o. year old female with pharyngitis 1. Pharyngitis  Culture, Group A Strep, amoxicillin (AMOXIL) 875 MG tablet  2. Nausea  promethazine (PHENERGAN) 25 MG tablet    - -Tylenol/Motrin prn -Rest/fluids -RTC precautions -RTC 3-5 days if no improvement  Signed, Elvina Sidle, MD 01/04/2012 2:42 PM

## 2012-01-06 ENCOUNTER — Emergency Department (HOSPITAL_BASED_OUTPATIENT_CLINIC_OR_DEPARTMENT_OTHER): Payer: Medicare Other

## 2012-01-06 ENCOUNTER — Encounter (HOSPITAL_BASED_OUTPATIENT_CLINIC_OR_DEPARTMENT_OTHER): Payer: Self-pay | Admitting: Emergency Medicine

## 2012-01-06 ENCOUNTER — Emergency Department (HOSPITAL_BASED_OUTPATIENT_CLINIC_OR_DEPARTMENT_OTHER)
Admission: EM | Admit: 2012-01-06 | Discharge: 2012-01-06 | Disposition: A | Discharge status: Home / Self Care | Payer: Medicare Other | Attending: Emergency Medicine | Admitting: Emergency Medicine

## 2012-01-06 DIAGNOSIS — R51 Headache: Secondary | ICD-10-CM | POA: Insufficient documentation

## 2012-01-06 DIAGNOSIS — Z9089 Acquired absence of other organs: Secondary | ICD-10-CM | POA: Insufficient documentation

## 2012-01-06 DIAGNOSIS — R110 Nausea: Secondary | ICD-10-CM | POA: Insufficient documentation

## 2012-01-06 DIAGNOSIS — Z8719 Personal history of other diseases of the digestive system: Secondary | ICD-10-CM | POA: Insufficient documentation

## 2012-01-06 DIAGNOSIS — R109 Unspecified abdominal pain: Secondary | ICD-10-CM | POA: Insufficient documentation

## 2012-01-06 DIAGNOSIS — Z792 Long term (current) use of antibiotics: Secondary | ICD-10-CM | POA: Insufficient documentation

## 2012-01-06 DIAGNOSIS — F411 Generalized anxiety disorder: Secondary | ICD-10-CM | POA: Insufficient documentation

## 2012-01-06 DIAGNOSIS — K5900 Constipation, unspecified: Secondary | ICD-10-CM | POA: Insufficient documentation

## 2012-01-06 DIAGNOSIS — M549 Dorsalgia, unspecified: Secondary | ICD-10-CM | POA: Insufficient documentation

## 2012-01-06 DIAGNOSIS — F3189 Other bipolar disorder: Secondary | ICD-10-CM | POA: Insufficient documentation

## 2012-01-06 DIAGNOSIS — F1910 Other psychoactive substance abuse, uncomplicated: Secondary | ICD-10-CM | POA: Insufficient documentation

## 2012-01-06 DIAGNOSIS — Z79899 Other long term (current) drug therapy: Secondary | ICD-10-CM | POA: Insufficient documentation

## 2012-01-06 DIAGNOSIS — Z87442 Personal history of urinary calculi: Secondary | ICD-10-CM | POA: Insufficient documentation

## 2012-01-06 LAB — URINALYSIS, ROUTINE W REFLEX MICROSCOPIC
Bilirubin Urine: NEGATIVE
Glucose, UA: NEGATIVE mg/dL
Ketones, ur: NEGATIVE mg/dL
Protein, ur: NEGATIVE mg/dL

## 2012-01-06 LAB — CBC WITH DIFFERENTIAL/PLATELET
Basophils Absolute: 0 10*3/uL (ref 0.0–0.1)
Eosinophils Absolute: 0 10*3/uL (ref 0.0–0.7)
Eosinophils Relative: 0 % (ref 0–5)
MCH: 29.4 pg (ref 26.0–34.0)
MCV: 83.1 fL (ref 78.0–100.0)
Platelets: 161 10*3/uL (ref 150–400)
RDW: 12.2 % (ref 11.5–15.5)
WBC: 5 10*3/uL (ref 4.0–10.5)

## 2012-01-06 LAB — CULTURE, GROUP A STREP: Organism ID, Bacteria: NORMAL

## 2012-01-06 LAB — BASIC METABOLIC PANEL
Calcium: 9.5 mg/dL (ref 8.4–10.5)
GFR calc Af Amer: 62 mL/min — ABNORMAL LOW (ref >90)
GFR calc non Af Amer: 54 mL/min — ABNORMAL LOW (ref >90)
Sodium: 137 mEq/L (ref 135–145)

## 2012-01-06 LAB — URINE MICROSCOPIC-ADD ON

## 2012-01-06 MED ORDER — SODIUM CHLORIDE 0.9 % IV SOLN: 1000.0000 mL | INTRAVENOUS | Status: DC

## 2012-01-06 MED ORDER — DICYCLOMINE HCL 20 MG PO TABS: 20.0000 mg | ORAL_TABLET | Freq: Two times a day (BID) | ORAL | Status: DC

## 2012-01-06 MED ORDER — SODIUM CHLORIDE 0.9 % IV SOLN
1000.0000 mL | Freq: Once | INTRAVENOUS | Status: AC
  Administered 2012-01-06: 1000 mL via INTRAVENOUS

## 2012-01-06 MED ORDER — ETODOLAC 500 MG PO TABS: 500.0000 mg | ORAL_TABLET | Freq: Two times a day (BID) | ORAL | Status: DC

## 2012-01-06 MED ORDER — HYDROCODONE-ACETAMINOPHEN 5-325 MG PO TABS
1.0000 | ORAL_TABLET | Freq: Once | ORAL | Status: AC
  Administered 2012-01-06: 1 via ORAL
  Filled 2012-01-06: qty 1

## 2012-01-06 MED ORDER — ONDANSETRON HCL 4 MG/2ML IJ SOLN
4.0000 mg | Freq: Once | INTRAMUSCULAR | Status: AC
  Administered 2012-01-06: 4 mg via INTRAVENOUS
  Filled 2012-01-06: qty 2

## 2012-01-06 NOTE — ED Notes (Signed)
Pt was seen at urgent care on 10/20 she reports at that time she was having a sore throat and nause, denies abdominal pain at that time. Pt was prescribed amoxxicilan and phenegran. Pt reports that phenegran is not helping nor his her muscle relaxers or percocet

## 2012-01-06 NOTE — ED Notes (Signed)
Pt states this ( vicodin) is not going to do anything, i take percocet 20 mg a day for pain .

## 2012-01-06 NOTE — ED Notes (Signed)
Patient transported to X-ray 

## 2012-01-06 NOTE — ED Notes (Signed)
Pt reports severe nausea for > 1week, feels dehydrated, denies diarhhea

## 2012-01-06 NOTE — ED Provider Notes (Signed)
History     CSN: [id number]  Arrival date & time 01/06/12  1941   First MD Initiated Contact with Patient 01/06/12 2118      Chief Complaint  Patient presents with  . Abdominal Pain    (Consider location/radiation/quality/duration/timing/severity/associated sxs/prior treatment) Patient is a 46 y.o. female presenting with abdominal pain. The history is provided by the patient.  Abdominal Pain The primary symptoms of the illness include abdominal pain and nausea. The primary symptoms of the illness do not include shortness of breath or dysuria. The current episode started more than 2 days ago. The onset of the illness was gradual. The problem has been gradually worsening.  The illness is associated with recent antibiotic use and awakening from sleep. The patient states that she believes she is currently not pregnant. The patient has had a change in bowel habit (she is unsure of the last bm.). Additional symptoms associated with the illness include constipation, frequency and back pain. Symptoms associated with the illness do not include chills, heartburn or hematuria. Significant associated medical issues do not include PUD, GERD, inflammatory bowel disease or diabetes.    Past Medical History  Diagnosis Date  . Dysrhythmia     Left sided heart diease  . Mental disorder   . Bipolar affective disorder, mixed, in partial or remission   . Headache   . Anxiety   . Renal disorder   . Kidney stones   . Depression   . 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 History of gallstones     Past Surgical History  Procedure Date  . Vaginal hysterectomy   . Cholecystectomy   . Total abdominal hysterectomy w/ bilateral salpingoophorectomy   . Foot surgery     right x 2  . Colonoscopy     2001, 2008.  Father has colon cancer  . Bunionectomy     11/14/2011    Family History  Problem Relation Age of Onset  . Colon cancer Father 57  . Stomach cancer Neg Hx   . Rectal  cancer Neg Hx   . Esophageal cancer Neg Hx   . Breast cancer Mother   . Breast cancer Father   . Uterine cancer Mother     History  Substance Use Topics  . Smoking status: Never Smoker   . Smokeless tobacco: Never Used  . Alcohol Use: No    OB History    Grav Para Term Preterm Abortions TAB SAB Ect Mult Living                  Review of Systems  Constitutional: Negative for chills and activity change.       All ROS Neg except as noted in HPI  HENT: Negative for nosebleeds and neck pain.   Eyes: Negative for photophobia and discharge.  Respiratory: Negative for cough, shortness of breath and wheezing.   Cardiovascular: Negative for chest pain and palpitations.  Gastrointestinal: Positive for nausea, abdominal pain and constipation. Negative for heartburn and blood in stool.  Genitourinary: Positive for frequency. Negative for dysuria and hematuria.  Musculoskeletal: Positive for back pain. Negative for arthralgias.  Skin: Negative.   Neurological: Negative for dizziness, seizures and speech difficulty.  Psychiatric/Behavioral: Negative for hallucinations and confusion.    Allergies  Review of patient's allergies indicates no known allergies.  Home Medications   Current Outpatient Rx  Name Route Sig Dispense Refill  . AMOXICILLIN 875 MG PO TABS Oral Take 1 tablet (  875 mg total) by mouth 2 (two) times daily. 20 tablet 0  . HYDROXYZINE PAMOATE 25 MG PO CAPS Oral Take 25 mg by mouth at bedtime.    Marland Kitchen LITHIUM CARBONATE ER 300 MG PO TBCR Oral Take 300 mg by mouth daily.    Marland Kitchen METHOCARBAMOL 500 MG PO TABS Oral Take 500 mg by mouth 4 (four) times daily. for foot pain.    . OXYCODONE-ACETAMINOPHEN 5-325 MG PO TABS Oral Take 1 tablet by mouth every 6 (six) hours as needed. 30 tablet 0  . PROMETHAZINE HCL 25 MG PO TABS Oral Take 1 tablet (25 mg total) by mouth every 8 (eight) hours as needed for nausea. 20 tablet 0  . QUETIAPINE FUMARATE 400 MG PO TABS Oral Take 800 mg by mouth at  bedtime.     . SUMATRIPTAN SUCCINATE 50 MG PO TABS Oral Take 1 tablet (50 mg total) by mouth every 2 (two) hours as needed for migraine. 10 tablet 1  . TRAZODONE HCL 100 MG PO TABS Oral Take 400 mg by mouth at bedtime.    Marland Kitchen ZOLPIDEM TARTRATE 10 MG PO TABS Oral Take 10 mg by mouth at bedtime. For sleep    . DICLOFENAC SODIUM 1 % TD GEL Topical Apply 1 application topically 4 (four) times daily. 100 g 0  . PREDNISONE 20 MG PO TABS  4/3/3/2/2/1/1 single daily dose for 7 days 16 tablet 0    BP 103/79  Pulse 98  Temp 98.6 F (37 C) (Oral)  Resp 18  Ht 5\' 6"  (1.676 m)  Wt 200 lb (90.719 kg)  BMI 32.28 kg/m2  SpO2 98%  Physical Exam  Nursing note and vitals reviewed. Constitutional: She is oriented to person, place, and time. She appears well-developed and well-nourished.  Non-toxic appearance.  HENT:  Head: Normocephalic.  Right Ear: Tympanic membrane and external ear normal.  Left Ear: Tympanic membrane and external ear normal.  Eyes: EOM and lids are normal. Pupils are equal, round, and reactive to light.  Neck: Normal range of motion. Neck supple. Carotid bruit is not present.  Cardiovascular: Normal rate, regular rhythm, normal heart sounds, intact distal pulses and normal pulses.   Pulmonary/Chest: Breath sounds normal. No respiratory distress.  Abdominal: Soft. Bowel sounds are normal. There is no guarding.       Mild to mod periumbilical and right lower quad pain to palpation. No CVAT.  Musculoskeletal: Normal range of motion.       Cast on the left lower leg  Lymphadenopathy:       Head (right side): No submandibular adenopathy present.       Head (left side): No submandibular adenopathy present.    She has no cervical adenopathy.  Neurological: She is alert and oriented to person, place, and time. She has normal strength. No cranial nerve deficit or sensory deficit.  Skin: Skin is warm and dry.  Psychiatric: Her speech is normal.       Flat affect.    ED Course    Procedures (including critical care time)  Labs Reviewed  URINALYSIS, ROUTINE W REFLEX MICROSCOPIC - Abnormal; Notable for the following:    APPearance CLOUDY (*)     Leukocytes, UA TRACE (*)     All other components within normal limits  URINE MICROSCOPIC-ADD ON - Abnormal; Notable for the following:    Squamous Epithelial / LPF FEW (*)     All other components within normal limits  BASIC METABOLIC PANEL - Abnormal; Notable for the  following:    Glucose, Bld 101 (*)     Creatinine, Ser 1.20 (*)     GFR calc non Af Amer 54 (*)     GFR calc Af Amer 62 (*)     All other components within normal limits  CBC WITH DIFFERENTIAL   Dg Abd Acute W/chest  01/06/2012  *RADIOLOGY REPORT*  Clinical Data: Abdominal pain  ACUTE ABDOMEN SERIES (ABDOMEN 2 VIEW & CHEST 1 VIEW)  Comparison: 08/17/2011 CT  Findings: Mild lower lobe opacities.  Cardiomediastinal contours within normal limits.  Surgical clips right upper quadrant.  Bowel gas pattern nonobstructive.  No free intraperitoneal air.  Mild leftward curvature of the lumbar spine, may be positional.  No acute osseous finding.  Organ outlines normal where seen.  IMPRESSION: Mild lung base opacities; atelectasis versus infiltrate.  Nonobstructive bowel gas pattern.   Original Report Authenticated By: Waneta Martins, M.D.      1. Abdominal pain       MDM   Vital signs stable. Discussed with the patient the plan to administer IV fluids. Administer IV Zofran for nausea. Will check labs and an acute abdomen x-ray. Patient's care will be continued by Dr. Lynelle Doctor.(10:16pm)   ----------- Above was documented by Ivery Quale.    No sign of acute abnormalities on labs and xrays.Doubt obstruction.  No significant dehydration noted.  Pt was given IV fluids.   At this time there does not appear to be any evidence of an acute emergency medical condition and the patient appears stable for discharge with appropriate outpatient follow up.    Pt  with    Celene Kras, MD 01/06/12 [phone]

## 2012-01-13 ENCOUNTER — Emergency Department (HOSPITAL_COMMUNITY): Payer: Medicare Other

## 2012-01-13 ENCOUNTER — Emergency Department (HOSPITAL_COMMUNITY)
Admission: EM | Admit: 2012-01-13 | Discharge: 2012-01-13 | Disposition: A | Discharge status: Home / Self Care | Payer: Medicare Other | Attending: Emergency Medicine | Admitting: Emergency Medicine

## 2012-01-13 ENCOUNTER — Encounter (HOSPITAL_COMMUNITY): Payer: Self-pay | Admitting: *Deleted

## 2012-01-13 DIAGNOSIS — R112 Nausea with vomiting, unspecified: Secondary | ICD-10-CM | POA: Insufficient documentation

## 2012-01-13 DIAGNOSIS — Z87442 Personal history of urinary calculi: Secondary | ICD-10-CM | POA: Insufficient documentation

## 2012-01-13 DIAGNOSIS — Z79899 Other long term (current) drug therapy: Secondary | ICD-10-CM | POA: Insufficient documentation

## 2012-01-13 DIAGNOSIS — I499 Cardiac arrhythmia, unspecified: Secondary | ICD-10-CM | POA: Insufficient documentation

## 2012-01-13 DIAGNOSIS — F319 Bipolar disorder, unspecified: Secondary | ICD-10-CM | POA: Insufficient documentation

## 2012-01-13 DIAGNOSIS — F411 Generalized anxiety disorder: Secondary | ICD-10-CM | POA: Insufficient documentation

## 2012-01-13 DIAGNOSIS — M25579 Pain in unspecified ankle and joints of unspecified foot: Secondary | ICD-10-CM | POA: Insufficient documentation

## 2012-01-13 DIAGNOSIS — M79673 Pain in unspecified foot: Secondary | ICD-10-CM

## 2012-01-13 MED ORDER — SODIUM CHLORIDE 0.9 % IV BOLUS (SEPSIS)
1000.0000 mL | Freq: Once | INTRAVENOUS | Status: AC
  Administered 2012-01-13: 1000 mL via INTRAVENOUS

## 2012-01-13 MED ORDER — HYDROMORPHONE HCL PF 1 MG/ML IJ SOLN
0.5000 mg | Freq: Once | INTRAMUSCULAR | Status: DC
  Filled 2012-01-13: qty 1

## 2012-01-13 MED ORDER — ONDANSETRON HCL 4 MG/2ML IJ SOLN
4.0000 mg | Freq: Once | INTRAMUSCULAR | Status: AC
  Administered 2012-01-13: 4 mg via INTRAVENOUS
  Filled 2012-01-13: qty 2

## 2012-01-13 MED ORDER — HYDROMORPHONE HCL PF 1 MG/ML IJ SOLN
1.0000 mg | Freq: Once | INTRAMUSCULAR | Status: AC
  Administered 2012-01-13: 1 mg via INTRAVENOUS
  Filled 2012-01-13: qty 1

## 2012-01-13 NOTE — ED Notes (Signed)
Patient transported to X-ray 

## 2012-01-13 NOTE — ED Notes (Signed)
Pt states she got her cast off her L foot yesterday, has a hard boot on L foot, states having severe pain in that foot, woke up this morning feeling shaky, then after school she started having nausea and vomiting, pt states she thinks the pain is so bad it's making her nauseous. Pt states she takes oxy for her pain but has not helped.

## 2012-01-13 NOTE — ED Notes (Signed)
Pt states she last had Oxycodone at 1000. Pt states she has a ride home.

## 2012-01-13 NOTE — ED Notes (Signed)
Pt states she felt dizzy and shaky when she woke up this morning and then had sudden n/v this afternoon.

## 2012-01-13 NOTE — ED Notes (Signed)
Unable to obtain IV access x 2 attempts. IV team paged. Call returned by IV team.

## 2012-01-13 NOTE — ED Provider Notes (Signed)
Medical screening examination/treatment/procedure(s) were conducted as a shared visit with non-physician practitioner(s) and myself.  I personally evaluated the patient during the encounter She has a history of left bunionectomy.  She just had her cast taken off.  Her foot.  She complains of severe foot pain.  She's had nausea and vomiting as well.  No fevers.  No trauma.  On, examination.  She is in no distress.  She has mild edema over the dorsum of her foot without any discoloration.  There is mild tenderness to palpation.  We will give her IV analgesics, and perform an x-ray, for evaluation  Cheri Guppy, MD 01/13/12 1940

## 2012-01-13 NOTE — ED Provider Notes (Signed)
History     CSN: [id number]  Arrival date & time 01/13/12  1604   First MD Initiated Contact with Patient 01/13/12 1745      Chief Complaint  Patient presents with  . Nausea  . Emesis  . Foot Pain    left    (Consider location/radiation/quality/duration/timing/severity/associated sxs/prior treatment) HPI Comments: This is a 46 year old female, who presents to the emergency department with the chief complaint of left foot pain. The patient is status post bunionectomy on August 28. The patient states that she got her cast removed 2 days ago, and that now her foot is in severe pain.  She is currently using a post-op boot. She also complains of nausea and vomiting, which she believes is associated with pain.  The nausea and vomiting began today, after she walked on  She has tried taking oxycodone, with no relief.  The history is provided by the patient. No language interpreter was used.    Past Medical History  Diagnosis Date  . Dysrhythmia     Left sided heart diease  . Mental disorder   . Bipolar affective disorder, mixed, in partial or remission   . Headache   . Anxiety   . Renal disorder   . Kidney stones   . Depression   . 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 History of gallstones     Past Surgical History  Procedure Date  . Vaginal hysterectomy   . Cholecystectomy   . Total abdominal hysterectomy w/ bilateral salpingoophorectomy   . Foot surgery     right x 2  . Colonoscopy     2001, 2008.  Father has colon cancer  . Bunionectomy     11/14/2011    Family History  Problem Relation Age of Onset  . Colon cancer Father 95  . Stomach cancer Neg Hx   . Rectal cancer Neg Hx   . Esophageal cancer Neg Hx   . Breast cancer Mother   . Breast cancer Father   . Uterine cancer Mother     History  Substance Use Topics  . Smoking status: Never Smoker   . Smokeless tobacco: Never Used  . Alcohol Use: No    OB History    Grav Para Term  Preterm Abortions TAB SAB Ect Mult Living                  Review of Systems  Gastrointestinal: Positive for nausea and vomiting.  Musculoskeletal:       Right foot pain  All other systems reviewed and are negative.    Allergies  Review of patient's allergies indicates no known allergies.  Home Medications   Current Outpatient Rx  Name Route Sig Dispense Refill  . AMOXICILLIN 875 MG PO TABS Oral Take 875 mg by mouth 2 (two) times daily.    . ETODOLAC 500 MG PO TABS Oral Take 500 mg by mouth 2 (two) times daily.    Marland Kitchen HYDROXYZINE PAMOATE 25 MG PO CAPS Oral Take 25 mg by mouth at bedtime.    Marland Kitchen LITHIUM CARBONATE ER 300 MG PO TBCR Oral Take 300 mg by mouth daily.    Marland Kitchen PROMETHAZINE HCL 25 MG PO TABS Oral Take 25 mg by mouth every 8 (eight) hours as needed. Nausea    . QUETIAPINE FUMARATE 400 MG PO TABS Oral Take 800 mg by mouth at bedtime.     . TRAZODONE HCL 100 MG PO TABS Oral Take  400 mg by mouth at bedtime.    Marland Kitchen ZOLPIDEM TARTRATE 10 MG PO TABS Oral Take 10 mg by mouth at bedtime. For sleep      BP 99/48  Pulse 108  Temp 98.3 F (36.8 C) (Oral)  Resp 18  SpO2 98%  Physical Exam  Nursing note and vitals reviewed. Constitutional: She appears well-developed and well-nourished.  HENT:  Head: Normocephalic and atraumatic.  Eyes: Conjunctivae normal and EOM are normal. Pupils are equal, round, and reactive to light.  Neck: Normal range of motion. Neck supple.  Cardiovascular: Normal rate, regular rhythm and normal heart sounds.   Pulmonary/Chest: Effort normal and breath sounds normal.  Abdominal: Soft. Bowel sounds are normal.  Musculoskeletal: She exhibits tenderness.       Left foot bunionectomy scar, painful to palpation, ROM is 4/5, strength is limited 2/2 pain.  Mild edema on the dorsal aspect of foot, no redness or signs of infection around the surgical site.  Strong distal pulses.  Flaky skin.  Neurological: She is alert.  Skin: Skin is warm and dry.       Flaky on  left foot  Psychiatric: She has a normal mood and affect. Her behavior is normal. Judgment and thought content normal.    ED Course  Procedures (including critical care time)  Results for orders placed during the hospital encounter of 01/06/12  URINALYSIS, ROUTINE W REFLEX MICROSCOPIC      Component Value Range   Color, Urine YELLOW  YELLOW   APPearance CLOUDY (*) CLEAR   Specific Gravity, Urine 1.019  1.005 - 1.030   pH 7.0  5.0 - 8.0   Glucose, UA NEGATIVE  NEGATIVE mg/dL   Hgb urine dipstick NEGATIVE  NEGATIVE   Bilirubin Urine NEGATIVE  NEGATIVE   Ketones, ur NEGATIVE  NEGATIVE mg/dL   Protein, ur NEGATIVE  NEGATIVE mg/dL   Urobilinogen, UA 0.2  0.0 - 1.0 mg/dL   Nitrite NEGATIVE  NEGATIVE   Leukocytes, UA TRACE (*) NEGATIVE  URINE MICROSCOPIC-ADD ON      Component Value Range   Squamous Epithelial / LPF FEW (*) RARE   WBC, UA 0-2  <3 WBC/hpf   Bacteria, UA RARE  RARE  CBC WITH DIFFERENTIAL      Component Value Range   WBC 5.0  4.0 - 10.5 K/uL   RBC 4.62  3.87 - 5.11 MIL/uL   Hemoglobin 13.6  12.0 - 15.0 g/dL   HCT 11.9  14.7 - 82.9 %   MCV 83.1  78.0 - 100.0 fL   MCH 29.4  26.0 - 34.0 pg   MCHC 35.4  30.0 - 36.0 g/dL   RDW 56.2  13.0 - 86.5 %   Platelets 161  150 - 400 K/uL   Neutrophils Relative 62  43 - 77 %   Neutro Abs 3.1  1.7 - 7.7 K/uL   Lymphocytes Relative 31  12 - 46 %   Lymphs Abs 1.5  0.7 - 4.0 K/uL   Monocytes Relative 7  3 - 12 %   Monocytes Absolute 0.4  0.1 - 1.0 K/uL   Eosinophils Relative 0  0 - 5 %   Eosinophils Absolute 0.0  0.0 - 0.7 K/uL   Basophils Relative 0  0 - 1 %   Basophils Absolute 0.0  0.0 - 0.1 K/uL  BASIC METABOLIC PANEL      Component Value Range   Sodium 137  135 - 145 mEq/L   Potassium 4.1  3.5 -  5.1 mEq/L   Chloride 99  96 - 112 mEq/L   CO2 25  19 - 32 mEq/L   Glucose, Bld 101 (*) 70 - 99 mg/dL   BUN 14  6 - 23 mg/dL   Creatinine, Ser 4.09 (*) 0.50 - 1.10 mg/dL   Calcium 9.5  8.4 - 81.1 mg/dL   GFR calc non Af Amer 54  (*) >90 mL/min   GFR calc Af Amer 62 (*) >90 mL/min   Dg Abd Acute W/chest  01/06/2012  *RADIOLOGY REPORT*  Clinical Data: Abdominal pain  ACUTE ABDOMEN SERIES (ABDOMEN 2 VIEW & CHEST 1 VIEW)  Comparison: 08/17/2011 CT  Findings: Mild lower lobe opacities.  Cardiomediastinal contours within normal limits.  Surgical clips right upper quadrant.  Bowel gas pattern nonobstructive.  No free intraperitoneal air.  Mild leftward curvature of the lumbar spine, may be positional.  No acute osseous finding.  Organ outlines normal where seen.  IMPRESSION: Mild lung base opacities; atelectasis versus infiltrate.  Nonobstructive bowel gas pattern.   Original Report Authenticated By: Waneta Martins, M.D.    Dg Foot Complete Left  01/13/2012  *RADIOLOGY REPORT*  Clinical Data: Pain  LEFT FOOT - COMPLETE 3+ VIEW  Comparison: None.  Findings: Changes of instrumented first tarsometatarsal arthrodesis, hardware intact without surrounding lucency.  Normal alignment.  No subcutaneous gas.  Normal mineralization and alignment.  IMPRESSION:  1.  No acute abnormality post first tarsometatarsal arthrodesis.   Original Report Authenticated By: Osa Craver, M.D.         1. Foot pain       MDM  46 year old female with left foot pain and nausea.  The patient is s/p bunionectomy ~1 month ago.   I have given the patient dilaudid and zofran for pain and nausea.  Plain films are detailed above.  I am going to discharge the patient to home and have her follow-up with Dr. Lestine Box.  I have instructed the patient to let her pain level control her activity level.  Instructed the patient to use RICE therapy.  Patient is stable and ready for discharge.  She has oxycodone at home for pain.        Roxy Horseman, PA-C 01/13/12 2001

## 2012-01-14 NOTE — ED Provider Notes (Signed)
Medical screening examination/treatment/procedure(s) were conducted as a shared visit with non-physician practitioner(s) and myself.  I personally evaluated the patient during the encounter  Cheri Guppy, MD 01/14/12 0002

## 2012-01-21 ENCOUNTER — Encounter: Payer: Self-pay | Admitting: Internal Medicine

## 2012-01-21 ENCOUNTER — Ambulatory Visit (INDEPENDENT_AMBULATORY_CARE_PROVIDER_SITE_OTHER): Payer: Medicare Other | Admitting: Internal Medicine

## 2012-01-21 VITALS — BP 102/68 | HR 77 | Temp 97.9°F | Resp 16 | Wt 196.8 lb

## 2012-01-21 DIAGNOSIS — G5620 Lesion of ulnar nerve, unspecified upper limb: Secondary | ICD-10-CM

## 2012-01-21 MED ORDER — METHYLPREDNISOLONE 4 MG PO KIT: PACK | ORAL | Status: DC

## 2012-01-21 NOTE — Progress Notes (Signed)
  Subjective:    Patient ID: MYLI PAE, female    DOB: 12-21-1965, 46 y.o.   MRN: [id number]  HPI patient presents to clinic for evaluation of hand and finger pain. Notes 2-1/2 month history of left fourth and fifth finger numbness and burning. Pain also felt the lateral aspect of the wrist and sometimes the elbow. Denies neck pain or radicular arm pain. No injury or trauma. States has appointment with specialist scheduled in December. Currently taking narcotics under the direction of the pain clinic. Declines influenza vaccine.  Past Medical History  Diagnosis Date  . Dysrhythmia     Left sided heart diease  . Mental disorder   . Bipolar affective disorder, mixed, in partial or remission   . Headache   . Anxiety   . Renal disorder   . Kidney stones   . Depression   . 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 History of gallstones    Past Surgical History  Procedure Date  . Vaginal hysterectomy   . Cholecystectomy   . Total abdominal hysterectomy w/ bilateral salpingoophorectomy   . Foot surgery     right x 2  . Colonoscopy     2001, 2008.  Father has colon cancer  . Bunionectomy     11/14/2011    reports that she has never smoked. She has never used smokeless tobacco. She reports that she does not drink alcohol or use illicit drugs. family history includes Breast cancer in her father and mother; Colon cancer (age of onset:52) in her father; and Uterine cancer in her mother.  There is no history of Stomach cancer, and Rectal cancer, and Esophageal cancer, . No Known Allergies   Review of Systems see history of present illness     Objective:   Physical Exam  Nursing note and vitals reviewed. Constitutional: She appears well-developed and well-nourished.  HENT:  Head: Normocephalic and atraumatic.  Musculoskeletal:       Full range of motion left arm and hand. No hand muscle atrophy. +2 radial artery pulse. Wrist flexor extensors as well as MCP muscle  strength 5/5.  Neurological: She is alert.  Skin: Skin is warm and dry.  Psychiatric: She has a normal mood and affect.          Assessment & Plan:

## 2012-01-21 NOTE — Assessment & Plan Note (Signed)
Given Depo-Medrol 20 mg IM today. Begin Medrol Dosepak. Keep appointment with orthopedic physician. Followup if no improvement or worsening.

## 2012-01-30 MED ORDER — METHYLPREDNISOLONE ACETATE 40 MG/ML IJ SUSP
20.0000 mg | Freq: Once | INTRAMUSCULAR | Status: AC
  Administered 2012-01-27: 20 mg via INTRAMUSCULAR

## 2012-01-30 NOTE — Addendum Note (Signed)
Addended by: Regis Bill on: 01/30/2012 11:19 AM   Modules accepted: Orders

## 2012-02-23 ENCOUNTER — Ambulatory Visit: Payer: Self-pay | Admitting: Internal Medicine

## 2012-03-16 ENCOUNTER — Ambulatory Visit: Payer: Self-pay | Admitting: Internal Medicine

## 2012-05-01 ENCOUNTER — Other Ambulatory Visit: Payer: Self-pay

## 2012-05-03 ENCOUNTER — Encounter (HOSPITAL_COMMUNITY): Payer: Self-pay | Admitting: *Deleted

## 2012-05-03 ENCOUNTER — Inpatient Hospital Stay (HOSPITAL_COMMUNITY): Payer: Medicare Other

## 2012-05-03 ENCOUNTER — Inpatient Hospital Stay (HOSPITAL_COMMUNITY)
Admission: EM | Admit: 2012-05-03 | Discharge: 2012-05-08 | DRG: 690 | Disposition: A | Discharge status: Home / Self Care | Payer: Medicare Other | Attending: Internal Medicine | Admitting: Internal Medicine

## 2012-05-03 DIAGNOSIS — R51 Headache: Secondary | ICD-10-CM

## 2012-05-03 DIAGNOSIS — F319 Bipolar disorder, unspecified: Secondary | ICD-10-CM

## 2012-05-03 DIAGNOSIS — Z800 Family history of malignant neoplasm of digestive organs: Secondary | ICD-10-CM

## 2012-05-03 DIAGNOSIS — R6884 Jaw pain: Secondary | ICD-10-CM | POA: Diagnosis present

## 2012-05-03 DIAGNOSIS — G5620 Lesion of ulnar nerve, unspecified upper limb: Secondary | ICD-10-CM

## 2012-05-03 DIAGNOSIS — R7989 Other specified abnormal findings of blood chemistry: Secondary | ICD-10-CM | POA: Diagnosis present

## 2012-05-03 DIAGNOSIS — R112 Nausea with vomiting, unspecified: Secondary | ICD-10-CM

## 2012-05-03 DIAGNOSIS — G4700 Insomnia, unspecified: Secondary | ICD-10-CM

## 2012-05-03 DIAGNOSIS — G894 Chronic pain syndrome: Secondary | ICD-10-CM

## 2012-05-03 DIAGNOSIS — Z79899 Other long term (current) drug therapy: Secondary | ICD-10-CM

## 2012-05-03 DIAGNOSIS — T56891A Toxic effect of other metals, accidental (unintentional), initial encounter: Secondary | ICD-10-CM

## 2012-05-03 DIAGNOSIS — K5900 Constipation, unspecified: Secondary | ICD-10-CM

## 2012-05-03 DIAGNOSIS — D696 Thrombocytopenia, unspecified: Secondary | ICD-10-CM

## 2012-05-03 DIAGNOSIS — Z87898 Personal history of other specified conditions: Secondary | ICD-10-CM

## 2012-05-03 DIAGNOSIS — M79646 Pain in unspecified finger(s): Secondary | ICD-10-CM

## 2012-05-03 DIAGNOSIS — M79673 Pain in unspecified foot: Secondary | ICD-10-CM

## 2012-05-03 DIAGNOSIS — E876 Hypokalemia: Secondary | ICD-10-CM | POA: Diagnosis present

## 2012-05-03 DIAGNOSIS — M549 Dorsalgia, unspecified: Secondary | ICD-10-CM

## 2012-05-03 DIAGNOSIS — R944 Abnormal results of kidney function studies: Secondary | ICD-10-CM | POA: Diagnosis present

## 2012-05-03 DIAGNOSIS — E860 Dehydration: Secondary | ICD-10-CM

## 2012-05-03 DIAGNOSIS — Z1239 Encounter for other screening for malignant neoplasm of breast: Secondary | ICD-10-CM

## 2012-05-03 DIAGNOSIS — G43909 Migraine, unspecified, not intractable, without status migrainosus: Secondary | ICD-10-CM

## 2012-05-03 DIAGNOSIS — R7889 Finding of other specified substances, not normally found in blood: Secondary | ICD-10-CM

## 2012-05-03 DIAGNOSIS — R079 Chest pain, unspecified: Secondary | ICD-10-CM

## 2012-05-03 DIAGNOSIS — N3000 Acute cystitis without hematuria: Principal | ICD-10-CM | POA: Diagnosis present

## 2012-05-03 DIAGNOSIS — F250 Schizoaffective disorder, bipolar type: Secondary | ICD-10-CM

## 2012-05-03 DIAGNOSIS — N200 Calculus of kidney: Secondary | ICD-10-CM

## 2012-05-03 DIAGNOSIS — N390 Urinary tract infection, site not specified: Secondary | ICD-10-CM

## 2012-05-03 DIAGNOSIS — Z020 Encounter for examination for admission to educational institution: Secondary | ICD-10-CM

## 2012-05-03 DIAGNOSIS — R739 Hyperglycemia, unspecified: Secondary | ICD-10-CM

## 2012-05-03 DIAGNOSIS — N289 Disorder of kidney and ureter, unspecified: Secondary | ICD-10-CM

## 2012-05-03 LAB — COMPREHENSIVE METABOLIC PANEL
AST: 20 U/L (ref 0–37)
Albumin: 4.1 g/dL (ref 3.5–5.2)
BUN: 15 mg/dL (ref 6–23)
Calcium: 9.3 mg/dL (ref 8.4–10.5)
Chloride: 102 mEq/L (ref 96–112)
Creatinine, Ser: 1.38 mg/dL — ABNORMAL HIGH (ref 0.50–1.10)
GFR calc non Af Amer: 45 mL/min — ABNORMAL LOW (ref >90)
Total Bilirubin: 0.4 mg/dL (ref 0.3–1.2)

## 2012-05-03 LAB — CBC WITH DIFFERENTIAL/PLATELET
Basophils Absolute: 0 10*3/uL (ref 0.0–0.1)
Basophils Relative: 0 % (ref 0–1)
Eosinophils Relative: 0 % (ref 0–5)
HCT: 39.2 % (ref 36.0–46.0)
Hemoglobin: 13.5 g/dL (ref 12.0–15.0)
MCH: 29.5 pg (ref 26.0–34.0)
MCHC: 34.4 g/dL (ref 30.0–36.0)
MCV: 85.8 fL (ref 78.0–100.0)
Monocytes Absolute: 0.4 10*3/uL (ref 0.1–1.0)
Monocytes Relative: 5 % (ref 3–12)
Neutro Abs: 5.9 10*3/uL (ref 1.7–7.7)
RBC: 4.57 MIL/uL (ref 3.87–5.11)
RDW: 13 % (ref 11.5–15.5)
WBC: 7.2 10*3/uL (ref 4.0–10.5)

## 2012-05-03 LAB — URINALYSIS, MICROSCOPIC ONLY
Protein, ur: 30 mg/dL — AB
Specific Gravity, Urine: 1.024 (ref 1.005–1.030)
Urobilinogen, UA: 0.2 mg/dL (ref 0.0–1.0)

## 2012-05-03 LAB — LITHIUM LEVEL: Lithium Lvl: 2.03 mEq/L (ref 0.80–1.40)

## 2012-05-03 MED ORDER — ONDANSETRON HCL 4 MG PO TABS: 4.0000 mg | ORAL_TABLET | Freq: Four times a day (QID) | ORAL | Status: DC | PRN

## 2012-05-03 MED ORDER — ONDANSETRON HCL 4 MG/2ML IJ SOLN: 4.0000 mg | Freq: Once | INTRAMUSCULAR | Status: DC

## 2012-05-03 MED ORDER — SODIUM CHLORIDE 0.9 % IV BOLUS (SEPSIS)
1000.0000 mL | Freq: Once | INTRAVENOUS | Status: AC
  Administered 2012-05-03: 1000 mL via INTRAVENOUS

## 2012-05-03 MED ORDER — ALBUTEROL SULFATE (5 MG/ML) 0.5% IN NEBU: 2.5000 mg | INHALATION_SOLUTION | RESPIRATORY_TRACT | Status: DC | PRN

## 2012-05-03 MED ORDER — HYDROMORPHONE HCL PF 1 MG/ML IJ SOLN
1.0000 mg | Freq: Once | INTRAMUSCULAR | Status: AC
  Administered 2012-05-03: 1 mg via INTRAVENOUS
  Filled 2012-05-03: qty 1

## 2012-05-03 MED ORDER — SODIUM CHLORIDE 0.9 % IJ SOLN
3.0000 mL | Freq: Two times a day (BID) | INTRAMUSCULAR | Status: DC
  Administered 2012-05-05 – 2012-05-06 (×3): 3 mL via INTRAVENOUS

## 2012-05-03 MED ORDER — ONDANSETRON HCL 4 MG/2ML IJ SOLN
4.0000 mg | Freq: Four times a day (QID) | INTRAMUSCULAR | Status: DC | PRN
  Administered 2012-05-04 – 2012-05-08 (×18): 4 mg via INTRAVENOUS
  Filled 2012-05-03 (×19): qty 2

## 2012-05-03 MED ORDER — ACETAMINOPHEN 650 MG RE SUPP: 650.0000 mg | Freq: Four times a day (QID) | RECTAL | Status: DC | PRN

## 2012-05-03 MED ORDER — HYDROXYZINE PAMOATE 25 MG PO CAPS
25.0000 mg | ORAL_CAPSULE | Freq: Every day | ORAL | Status: DC
  Filled 2012-05-03 (×2): qty 1

## 2012-05-03 MED ORDER — DEXTROSE 5 % IV SOLN
1.0000 g | INTRAVENOUS | Status: AC
  Administered 2012-05-03 – 2012-05-05 (×3): 1 g via INTRAVENOUS
  Filled 2012-05-03 (×3): qty 10

## 2012-05-03 MED ORDER — QUETIAPINE FUMARATE 400 MG PO TABS
800.0000 mg | ORAL_TABLET | Freq: Every day | ORAL | Status: DC
  Administered 2012-05-07: 800 mg via ORAL
  Filled 2012-05-03 (×6): qty 2

## 2012-05-03 MED ORDER — SODIUM CHLORIDE 0.9 % IV SOLN: INTRAVENOUS | Status: DC

## 2012-05-03 MED ORDER — HEPARIN SODIUM (PORCINE) 5000 UNIT/ML IJ SOLN
5000.0000 [IU] | Freq: Three times a day (TID) | INTRAMUSCULAR | Status: DC
  Administered 2012-05-03 – 2012-05-08 (×15): 5000 [IU] via SUBCUTANEOUS
  Filled 2012-05-03 (×17): qty 1

## 2012-05-03 MED ORDER — PANTOPRAZOLE SODIUM 40 MG IV SOLR
40.0000 mg | Freq: Once | INTRAVENOUS | Status: AC
  Administered 2012-05-03: 40 mg via INTRAVENOUS
  Filled 2012-05-03: qty 40

## 2012-05-03 MED ORDER — PROMETHAZINE HCL 25 MG/ML IJ SOLN
12.5000 mg | Freq: Once | INTRAMUSCULAR | Status: AC
  Administered 2012-05-03: 12.5 mg via INTRAVENOUS
  Filled 2012-05-03: qty 1

## 2012-05-03 MED ORDER — SODIUM CHLORIDE 0.9 % IV BOLUS (SEPSIS)
500.0000 mL | Freq: Once | INTRAVENOUS | Status: DC
Start: 2012-05-03 — End: 2012-05-03

## 2012-05-03 MED ORDER — ONDANSETRON HCL 4 MG/2ML IJ SOLN
4.0000 mg | Freq: Once | INTRAMUSCULAR | Status: AC
  Administered 2012-05-03: 4 mg via INTRAVENOUS
  Filled 2012-05-03: qty 2

## 2012-05-03 MED ORDER — MORPHINE SULFATE 2 MG/ML IJ SOLN
1.0000 mg | INTRAMUSCULAR | Status: DC | PRN
  Administered 2012-05-03 – 2012-05-04 (×3): 1 mg via INTRAVENOUS
  Filled 2012-05-03 (×4): qty 1

## 2012-05-03 MED ORDER — OXYCODONE HCL 5 MG PO TABS
5.0000 mg | ORAL_TABLET | ORAL | Status: DC | PRN
  Administered 2012-05-04 – 2012-05-08 (×3): 5 mg via ORAL
  Filled 2012-05-03 (×3): qty 1

## 2012-05-03 MED ORDER — PROMETHAZINE HCL 25 MG/ML IJ SOLN
12.5000 mg | Freq: Four times a day (QID) | INTRAMUSCULAR | Status: DC | PRN
  Administered 2012-05-03 – 2012-05-08 (×17): 12.5 mg via INTRAVENOUS
  Filled 2012-05-03 (×18): qty 1

## 2012-05-03 MED ORDER — HYDROXYZINE HCL 25 MG PO TABS
25.0000 mg | ORAL_TABLET | Freq: Every day | ORAL | Status: DC
  Administered 2012-05-03 – 2012-05-07 (×2): 25 mg via ORAL
  Filled 2012-05-03 (×6): qty 1

## 2012-05-03 MED ORDER — SODIUM CHLORIDE 0.9 % IV SOLN
INTRAVENOUS | Status: DC
  Administered 2012-05-03 – 2012-05-06 (×8): via INTRAVENOUS
  Administered 2012-05-07: 1000 mL via INTRAVENOUS
  Administered 2012-05-07 – 2012-05-08 (×3): via INTRAVENOUS

## 2012-05-03 MED ORDER — LORAZEPAM 2 MG/ML IJ SOLN
1.0000 mg | Freq: Once | INTRAMUSCULAR | Status: AC
  Administered 2012-05-03: 09:00:00 via INTRAVENOUS
  Filled 2012-05-03: qty 1

## 2012-05-03 MED ORDER — ACETAMINOPHEN 325 MG PO TABS: 650.0000 mg | ORAL_TABLET | Freq: Four times a day (QID) | ORAL | Status: DC | PRN

## 2012-05-03 MED ORDER — PANTOPRAZOLE SODIUM 40 MG IV SOLR
40.0000 mg | Freq: Two times a day (BID) | INTRAVENOUS | Status: DC
  Administered 2012-05-03 – 2012-05-08 (×10): 40 mg via INTRAVENOUS
  Filled 2012-05-03 (×11): qty 40

## 2012-05-03 NOTE — ED Notes (Signed)
Moved iv hard stick will call iv team

## 2012-05-03 NOTE — ED Notes (Signed)
Main lab phlebotomist was unable to obtain blood for labs.RN made aware

## 2012-05-03 NOTE — ED Notes (Signed)
Attempted to call report Rn is unavailable at this time. Rn to phone Vernita when ready to give report.

## 2012-05-03 NOTE — ED Notes (Signed)
Pt c/o n/v x 3 days ago. Pt states 6-7 episodes of vomiting in past 24 hrs. Pt denies diarrhea and fever.

## 2012-05-03 NOTE — H&P (Addendum)
[facility] History and Physical  [name] ZOX:[id number] DOB: 11-24-1965 DOA: 05/03/2012   PCP: Letitia Libra, Ala Dach, MD  Specialists: She is followed by a pain specialist, Dr. Vear Clock. Her psychiatrist is in [place].  Chief Complaint: Nausea, vomiting, for the last 5 days  HPI: [name] is a 47 y.o. female with a past medical history of bipolar disorder, chronic pain, who was in her usual state of health about 5 days ago, when she started having nausea and vomiting. In the last 3 days she's developed pain in the left jaw. Denies any falls or injuries. She also has some neck pain at times. Denies any chest pains. She tells me that when she stands up she feels her heart racing at times. Has had minimal shortness of breath. Emesis consisted of everything that she has taken by mouth. She's had numerous episodes. Denies any blood in the emesis. Denies any fever, but has had chills. Denies any sick contacts. She's had some abdominal pain in the lower abdomen, but denies any diarrhea. She has had nausea and vomiting on and off for the last one year. She had attempted colonoscopy last year, but had to be aborted because of poor prep. She tells me she's had multiple hospitalization for her current symptoms. Her lithium level was found to be elevated, but she denies taking more lithium than she is supposed to. Denies any suicidal ideation. The left jaw pain is a constant pain, and it radiates down the neck. Hasn't noticed any swelling in that area.  Home Medications: Prior to Admission medications   Medication Sig Start Date End Date Taking? Authorizing Provider  lithium carbonate (LITHOBID) 300 MG CR tablet Take 300 mg by mouth daily.   Yes Historical Provider, MD  oxyCODONE (OXY IR/ROXICODONE) 5 MG immediate release tablet Take 5 mg by mouth every 4 (four) hours as needed.   Yes Historical Provider, MD  QUEtiapine (SEROQUEL) 400 MG tablet Take 800 mg by mouth at bedtime.    Yes  Historical Provider, MD  traZODone (DESYREL) 100 MG tablet Take 400 mg by mouth at bedtime.   Yes Historical Provider, MD  zolpidem (AMBIEN) 10 MG tablet Take 10 mg by mouth at bedtime. For sleep   Yes Historical Provider, MD  hydrOXYzine (VISTARIL) 25 MG capsule Take 25 mg by mouth at bedtime.    Historical Provider, MD    Allergies: No Known Allergies  Past Medical History: Past Medical History  Diagnosis Date  . Dysrhythmia     Left sided heart diease  . Mental disorder   . Bipolar affective disorder, mixed, in partial or remission   . Headache   . Anxiety   . Renal disorder   . Kidney stones   . Depression   . 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 History of gallstones     Past Surgical History  Procedure Laterality Date  . Vaginal hysterectomy    . Cholecystectomy    . Total abdominal hysterectomy w/ bilateral salpingoophorectomy    . Foot surgery      right x 2  . Colonoscopy      2001, 2008.  Father has colon cancer  . Bunionectomy      11/14/2011    Social History:  reports that she has never smoked. She has never used smokeless tobacco. She reports that she does not drink alcohol or use illicit drugs.  Living Situation: Lives in [place] with her  husband Activity Level: Usually independent with daily activities   Family History:  Family History  Problem Relation Age of Onset  . Colon cancer Father 44  . Stomach cancer Neg Hx   . Rectal cancer Neg Hx   . Esophageal cancer Neg Hx   . Breast cancer Mother   . Breast cancer Father   . Uterine cancer Mother      Review of Systems - History obtained from the patient General ROS: positive for  - fatigue Psychological ROS: positive for - anxiety Ophthalmic ROS: negative ENT ROS: positive for - jaw pain Allergy and Immunology ROS: negative Hematological and Lymphatic ROS: negative Endocrine ROS: negative Respiratory ROS: no cough, shortness of breath, or wheezing Cardiovascular  ROS: no chest pain or dyspnea on exertion Gastrointestinal ROS: as in hpi Genito-Urinary ROS: no dysuria, trouble voiding, or hematuria Musculoskeletal ROS: negative Neurological ROS: no TIA or stroke symptoms Dermatological ROS: negative  Physical Examination  Filed Vitals:   05/03/12 0700 05/03/12 0908 05/03/12 1020 05/03/12 1731  BP: 115/75 114/68 133/88 118/79  Pulse: 119 100 115 98  Temp: 99 F (37.2 C) 99.3 F (37.4 C) 99 F (37.2 C) 99.5 F (37.5 C)  TempSrc: Oral Oral Oral Oral  Resp: 18 18  18   SpO2: 96% 98% 99% 96%    General appearance: alert, cooperative, appears stated age and no distress Head: Normocephalic, without obvious abnormality, atraumatic Eyes: conjunctivae/corneas clear. PERRL, EOM's intact.  Throat: lips, mucosa, and tongue normal; teeth and gums normal Jaw was examined. No obvious swelling. She's able to open her mouth without any difficulty. No lesions inside the mouth. Angle of the jaw was tender. Neck: no adenopathy, no carotid bruit, no JVD, supple, symmetrical, trachea midline and thyroid not enlarged, symmetric, no tenderness/mass/nodules Resp: clear to auscultation bilaterally Cardio: regular rate and rhythm, S1, S2 normal, no murmur, click, rub or gallop GI: soft, non-tender; bowel sounds normal; no masses,  no organomegaly Extremities: Her left arm is in a splint due to ulner nerve compression Pulses: 2+ and symmetric Skin: Skin color, texture, turgor normal. No rashes or lesions Lymph nodes: Cervical, supraclavicular, and axillary nodes normal. Neurologic: She is alert and oriented x3. No focal neurological deficits are present  Laboratory Data: Results for orders placed during the hospital encounter of 05/03/12 (from the past 48 hour(s))  URINALYSIS, MICROSCOPIC ONLY     Status: Abnormal   Collection Time    05/03/12  7:19 AM      Result Value Range   Color, Urine YELLOW  YELLOW   APPearance CLOUDY (*) CLEAR   Specific Gravity, Urine  1.024  1.005 - 1.030   pH 5.0  5.0 - 8.0   Glucose, UA NEGATIVE  NEGATIVE mg/dL   Hgb urine dipstick NEGATIVE  NEGATIVE   Bilirubin Urine NEGATIVE  NEGATIVE   Ketones, ur NEGATIVE  NEGATIVE mg/dL   Protein, ur 30 (*) NEGATIVE mg/dL   Urobilinogen, UA 0.2  0.0 - 1.0 mg/dL   Nitrite NEGATIVE  NEGATIVE   Leukocytes, UA TRACE (*) NEGATIVE   WBC, UA 7-10  <3 WBC/hpf   Bacteria, UA FEW (*) RARE   Squamous Epithelial / LPF FEW (*) RARE   Casts GRANULAR CAST (*) NEGATIVE  CBC WITH DIFFERENTIAL     Status: Abnormal   Collection Time    05/03/12  7:30 AM      Result Value Range   WBC 7.2  4.0 - 10.5 K/uL   RBC 4.57  3.87 -  5.11 MIL/uL   Hemoglobin 13.5  12.0 - 15.0 g/dL   HCT 28.4  13.2 - 44.0 %   MCV 85.8  78.0 - 100.0 fL   MCH 29.5  26.0 - 34.0 pg   MCHC 34.4  30.0 - 36.0 g/dL   RDW 10.2  72.5 - 36.6 %   Platelets 144 (*) 150 - 400 K/uL   Neutrophils Relative 81 (*) 43 - 77 %   Neutro Abs 5.9  1.7 - 7.7 K/uL   Lymphocytes Relative 13  12 - 46 %   Lymphs Abs 1.0  0.7 - 4.0 K/uL   Monocytes Relative 5  3 - 12 %   Monocytes Absolute 0.4  0.1 - 1.0 K/uL   Eosinophils Relative 0  0 - 5 %   Eosinophils Absolute 0.0  0.0 - 0.7 K/uL   Basophils Relative 0  0 - 1 %   Basophils Absolute 0.0  0.0 - 0.1 K/uL  COMPREHENSIVE METABOLIC PANEL     Status: Abnormal   Collection Time    05/03/12  7:30 AM      Result Value Range   Sodium 138  135 - 145 mEq/L   Potassium 3.5  3.5 - 5.1 mEq/L   Chloride 102  96 - 112 mEq/L   CO2 25  19 - 32 mEq/L   Glucose, Bld 144 (*) 70 - 99 mg/dL   BUN 15  6 - 23 mg/dL   Creatinine, Ser 4.40 (*) 0.50 - 1.10 mg/dL   Calcium 9.3  8.4 - 34.7 mg/dL   Total Protein 7.5  6.0 - 8.3 g/dL   Albumin 4.1  3.5 - 5.2 g/dL   AST 20  0 - 37 U/L   ALT 21  0 - 35 U/L   Alkaline Phosphatase 72  39 - 117 U/L   Total Bilirubin 0.4  0.3 - 1.2 mg/dL   GFR calc non Af Amer 45 (*) >90 mL/min   GFR calc Af Amer 52 (*) >90 mL/min   Comment:            The eGFR has been  calculated     using the CKD EPI equation.     This calculation has not been     validated in all clinical     situations.     eGFR's persistently     <90 mL/min signify     possible Chronic Kidney Disease.  LITHIUM LEVEL     Status: Abnormal   Collection Time    05/03/12  8:45 AM      Result Value Range   Lithium Lvl 2.03 (*) 0.80 - 1.40 mEq/L   Comment: CRITICAL RESULT CALLED TO, READ BACK BY AND VERIFIED WITH:     [id number]    Radiology Reports: Dg Abd 2 Views  05/03/2012  *RADIOLOGY REPORT*  Clinical Data: Abdominal pain,, nausea vomiting  ABDOMEN - 2 VIEW  Comparison: 01/06/2012  Findings: Lung bases clear.  Prior cholecystectomy clips evident. Moderate retained stool throughout the colon.  Negative for obstruction or significant dilatation.  No osseous abnormality. Pelvic calcifications consistent with venous phleboliths.  Postop changes in the pelvis.  IMPRESSION: Negative for obstruction or ileus.  Retained stool throughout the colon.   Original Report Authenticated By: Judie Petit. Miles Costain, M.D.     Electrocardiogram: Sinus rhythm and 96 beats per minute. Normal axis. Intervals are normal. No ST segment changes. There is T inversion noted noted in leads V1 through 4. Compared to previous EKG  the inversion in V3, and V4, appears to be new.  Problem List  Principal Problem:   Nausea and vomiting in adult Active Problems:   Bipolar 1 disorder   Ulnar neuropathy   Dehydration   Abnormal lithium level in blood   UTI (lower urinary tract infection)   Chronic pain syndrome   Assessment: This is a 47 year old, Caucasian female, who presents with nausea, vomiting for 5 days and jaw pain for the last 3 days. Her UA was abnormal. This could be secondary to UTI. Her abdominal film shows a retained stool suggestive of constipation. Etiology for the jaw pain is not clear. She also has elevated lithium level.  Plan: #1 nausea and vomiting: The etiology is unclear. Could be  related to the UTI or constipation. We will give her symptomatic treatment. Keep her on ice chips for tonight. There is no evidence of obstruction on the abdominal film. Give her IV fluids. She had 2 large emesis while I was assessing her.  #2 dehydration with mildly elevated creatinine: Should improve with IV fluids.  #3 left-sided jaw pain: We'll get a troponin level. She however, denies any chest pain. It's unclear what is causing her current symptoms. There is no obvious deformity on examination. No gingivitis is noted. Continue to monitor for now.  #4 elevated lithium level: There is no obvious signs and symptoms of toxicity. We will hydrate her. We will monitor her on telemetry. Repeat her level every 4 hours till there is a trend downwards. The levels are not high enough nor does she have any significant toxicity to warrant aggressive management  #5 abnormal UA/UTI: Cultures will be followed up on. She'll be kept on ceftriaxone for now.  #6 history of bipolar disorder: Continue with her Seroquel. Hold her lithium for above reasons  #7 constipation: This will be addressed once her current symptoms have improved. TSH will be checked  DVT Prophylaxis: Heparin Code Status: Full code Family Communication: Discussed with the patient  Disposition Plan: Will likely return home when improved   Further management decisions will depend on results of further testing and patient's response to treatment.  [facility]  [facility] Pager [phone]  If 7PM-7AM, please contact night-coverage [email or web address] Password [facility]  05/03/2012, 5:46 PM

## 2012-05-03 NOTE — ED Notes (Signed)
Unsuccessfully attempted to obtain blood for labs x2.RN made aware 

## 2012-05-03 NOTE — ED Notes (Addendum)
Lab and staff unable to draw  labs Dr Denton Lank aware

## 2012-05-03 NOTE — ED Provider Notes (Addendum)
History     CSN: [id number]  Arrival date & time 05/03/12  1478   First MD Initiated Contact with Patient 05/03/12 [phone]      Chief Complaint  Patient presents with  . Emesis    (Consider location/radiation/quality/duration/timing/severity/associated sxs/prior treatment) Patient is a 47 y.o. female presenting with vomiting. The history is provided by the patient.  Emesis Associated symptoms: no chills, no diarrhea and no headaches   pt c/o nv x several episodes for the past 2-3 days. Emesis clear or color recently ingested fluids, no bloody or bilious emesis. No abd distension. Intermittent, diffuse, crampy abd pain, no constant or focal abd pain. Having normal bms, denies diarrhea or constipation. States due to nv hasnt kept down her normal meds, including pain meds, requests pain rx. Denies recent known ill contacts or bad food ingestion. No recent travel. Denies recent change in meds. No dysuria or gu c/o. No cough or uri c/o. No headaches. No chest pain or discomfort.    Past Medical History  Diagnosis Date  . Dysrhythmia     Left sided heart diease  . Mental disorder   . Bipolar affective disorder, mixed, in partial or remission   . Headache   . Anxiety   . Renal disorder   . Kidney stones   . Depression   . 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 History of gallstones     Past Surgical History  Procedure Laterality Date  . Vaginal hysterectomy    . Cholecystectomy    . Total abdominal hysterectomy w/ bilateral salpingoophorectomy    . Foot surgery      right x 2  . Colonoscopy      2001, 2008.  Father has colon cancer  . Bunionectomy      11/14/2011    Family History  Problem Relation Age of Onset  . Colon cancer Father 65  . Stomach cancer Neg Hx   . Rectal cancer Neg Hx   . Esophageal cancer Neg Hx   . Breast cancer Mother   . Breast cancer Father   . Uterine cancer Mother     History  Substance Use Topics  . Smoking status:  Never Smoker   . Smokeless tobacco: Never Used  . Alcohol Use: No    OB History   Grav Para Term Preterm Abortions TAB SAB Ect Mult Living                  Review of Systems  Constitutional: Negative for fever and chills.  HENT: Negative for neck pain.        Pt c/o left lower tooth pain in past 1-2 days, denies injury. No local dentist. No floor of mouth, neck or throat pain or swelling.   Eyes: Negative for redness.  Respiratory: Negative for cough and shortness of breath.   Cardiovascular: Negative for chest pain and leg swelling.  Gastrointestinal: Positive for vomiting. Negative for diarrhea and constipation.  Genitourinary: Negative for flank pain.  Musculoskeletal: Negative for back pain.  Skin: Negative for rash.  Neurological: Negative for headaches.  Hematological: Does not bruise/bleed easily.  Psychiatric/Behavioral: Negative for confusion.    Allergies  Review of patient's allergies indicates no known allergies.  Home Medications   Current Outpatient Rx  Name  Route  Sig  Dispense  Refill  . etodolac (LODINE) 500 MG tablet   Oral   Take 500 mg by mouth 2 (two) times daily.         Marland Kitchen  methylPREDNISolone (MEDROL, PAK,) 4 MG tablet      follow package directions   21 tablet   0   . promethazine (PHENERGAN) 25 MG tablet   Oral   Take 25 mg by mouth every 8 (eight) hours as needed. Nausea         . zolpidem (AMBIEN) 10 MG tablet   Oral   Take 10 mg by mouth at bedtime. For sleep         . hydrOXYzine (VISTARIL) 25 MG capsule   Oral   Take 25 mg by mouth at bedtime.         Marland Kitchen lithium carbonate (LITHOBID) 300 MG CR tablet   Oral   Take 300 mg by mouth daily.         Marland Kitchen oxyCODONE (OXY IR/ROXICODONE) 5 MG immediate release tablet   Oral   Take 5 mg by mouth every 4 (four) hours as needed.         Marland Kitchen QUEtiapine (SEROQUEL) 400 MG tablet   Oral   Take 800 mg by mouth at bedtime.          . traZODone (DESYREL) 100 MG tablet   Oral    Take 400 mg by mouth at bedtime.           BP 115/75  Pulse 119  Temp(Src) 99 F (37.2 C) (Oral)  Resp 18  SpO2 96%  Physical Exam  Nursing note and vitals reviewed. Constitutional: She appears well-developed and well-nourished. No distress.  HENT:  Mouth/Throat: Oropharynx is clear and moist.  Left lower dental tenderness, mild gum swelling/tenderness. No trismus. No facial or neck swelling.   Eyes: Conjunctivae are normal. No scleral icterus.  Neck: Neck supple. No tracheal deviation present.  Cardiovascular: Normal rate, regular rhythm, normal heart sounds and intact distal pulses.   Pulmonary/Chest: Effort normal and breath sounds normal. No respiratory distress.  Abdominal: Soft. Normal appearance and bowel sounds are normal. She exhibits no distension and no mass. There is no tenderness. There is no rebound and no guarding.  Genitourinary:  No cva tenderness  Musculoskeletal: She exhibits no edema and no tenderness.  Neurological: She is alert.  Skin: Skin is warm and dry. No rash noted.  Psychiatric: She has a normal mood and affect.    ED Course  Procedures (including critical care time)  Results for orders placed during the hospital encounter of 05/03/12  CBC WITH DIFFERENTIAL      Result Value Range   WBC 7.2  4.0 - 10.5 K/uL   RBC 4.57  3.87 - 5.11 MIL/uL   Hemoglobin 13.5  12.0 - 15.0 g/dL   HCT 08.6  57.8 - 46.9 %   MCV 85.8  78.0 - 100.0 fL   MCH 29.5  26.0 - 34.0 pg   MCHC 34.4  30.0 - 36.0 g/dL   RDW 62.9  52.8 - 41.3 %   Platelets 144 (*) 150 - 400 K/uL   Neutrophils Relative 81 (*) 43 - 77 %   Neutro Abs 5.9  1.7 - 7.7 K/uL   Lymphocytes Relative 13  12 - 46 %   Lymphs Abs 1.0  0.7 - 4.0 K/uL   Monocytes Relative 5  3 - 12 %   Monocytes Absolute 0.4  0.1 - 1.0 K/uL   Eosinophils Relative 0  0 - 5 %   Eosinophils Absolute 0.0  0.0 - 0.7 K/uL   Basophils Relative 0  0 - 1 %   Basophils  Absolute 0.0  0.0 - 0.1 K/uL  COMPREHENSIVE METABOLIC PANEL       Result Value Range   Sodium 138  135 - 145 mEq/L   Potassium 3.5  3.5 - 5.1 mEq/L   Chloride 102  96 - 112 mEq/L   CO2 25  19 - 32 mEq/L   Glucose, Bld 144 (*) 70 - 99 mg/dL   BUN 15  6 - 23 mg/dL   Creatinine, Ser 4.09 (*) 0.50 - 1.10 mg/dL   Calcium 9.3  8.4 - 81.1 mg/dL   Total Protein 7.5  6.0 - 8.3 g/dL   Albumin 4.1  3.5 - 5.2 g/dL   AST 20  0 - 37 U/L   ALT 21  0 - 35 U/L   Alkaline Phosphatase 72  39 - 117 U/L   Total Bilirubin 0.4  0.3 - 1.2 mg/dL   GFR calc non Af Amer 45 (*) >90 mL/min   GFR calc Af Amer 52 (*) >90 mL/min  URINALYSIS, MICROSCOPIC ONLY      Result Value Range   Color, Urine YELLOW  YELLOW   APPearance CLOUDY (*) CLEAR   Specific Gravity, Urine 1.024  1.005 - 1.030   pH 5.0  5.0 - 8.0   Glucose, UA NEGATIVE  NEGATIVE mg/dL   Hgb urine dipstick NEGATIVE  NEGATIVE   Bilirubin Urine NEGATIVE  NEGATIVE   Ketones, ur NEGATIVE  NEGATIVE mg/dL   Protein, ur 30 (*) NEGATIVE mg/dL   Urobilinogen, UA 0.2  0.0 - 1.0 mg/dL   Nitrite NEGATIVE  NEGATIVE   Leukocytes, UA TRACE (*) NEGATIVE   WBC, UA 7-10  <3 WBC/hpf   Bacteria, UA FEW (*) RARE   Squamous Epithelial / LPF FEW (*) RARE   Casts GRANULAR CAST (*) NEGATIVE  LITHIUM LEVEL      Result Value Range   Lithium Lvl 2.03 (*) 0.80 - 1.40 mEq/L      MDM  Iv ns bolus. zofran iv. Dilaudid 1 mg iv, protonix iv.   Reviewed nursing notes and prior charts for additional history.   2nd liter ns bolus.  Pt anxious, pain improved. Ativan iv.   Recheck abd soft nt. No recurrent nv.   Labs pending.   ?possible uti, cx sent.  Will rx keflex pending culture.   Pt notes chronic pain in LUE, states prior surgery for same.  Pt requests dilaudid and phenergan.  Recheck abd soft nt.  Po fluids.   Additional ivf. High lithium level - discussed w pt, denies any overdose or taking any medication in over the prescribed dose.  Given elevated lithium level, dehydration, mild renal insuff -  Will admit, [facility]  called.   Recheck pt improved from prior. Hr 98 rr 16.    [facility] states team 4 tele   Suzi Roots, MD 05/03/12 9147  Suzi Roots, MD 05/03/12 [id number]

## 2012-05-04 LAB — URINE CULTURE: Colony Count: 30000

## 2012-05-04 LAB — CBC
Platelets: 139 10*3/uL — ABNORMAL LOW (ref 150–400)
RDW: 12.8 % (ref 11.5–15.5)
WBC: 6.4 10*3/uL (ref 4.0–10.5)

## 2012-05-04 LAB — COMPREHENSIVE METABOLIC PANEL
Alkaline Phosphatase: 64 U/L (ref 39–117)
BUN: 10 mg/dL (ref 6–23)
CO2: 25 mEq/L (ref 19–32)
Chloride: 105 mEq/L (ref 96–112)
GFR calc Af Amer: >90 mL/min (ref >90)
GFR calc non Af Amer: >90 mL/min (ref >90)
Glucose, Bld: 112 mg/dL — ABNORMAL HIGH (ref 70–99)
Potassium: 4.5 mEq/L (ref 3.5–5.1)
Total Bilirubin: 0.6 mg/dL (ref 0.3–1.2)

## 2012-05-04 LAB — LITHIUM LEVEL: Lithium Lvl: <0.25 mEq/L — ABNORMAL LOW (ref 0.80–1.40)

## 2012-05-04 MED ORDER — PROMETHAZINE HCL 25 MG/ML IJ SOLN
12.5000 mg | Freq: Once | INTRAMUSCULAR | Status: AC
  Administered 2012-05-04: 12.5 mg via INTRAVENOUS

## 2012-05-04 MED ORDER — HYDROMORPHONE HCL PF 1 MG/ML IJ SOLN
1.0000 mg | Freq: Once | INTRAMUSCULAR | Status: DC
  Filled 2012-05-04: qty 1

## 2012-05-04 MED ORDER — LITHIUM CARBONATE ER 300 MG PO TBCR
300.0000 mg | EXTENDED_RELEASE_TABLET | Freq: Every day | ORAL | Status: DC
  Administered 2012-05-05 – 2012-05-07 (×3): 300 mg via ORAL
  Filled 2012-05-04 (×5): qty 1

## 2012-05-04 MED ORDER — HYDROMORPHONE HCL PF 1 MG/ML IJ SOLN
1.0000 mg | INTRAMUSCULAR | Status: DC | PRN
Start: 2012-05-04 — End: 2012-05-08
  Administered 2012-05-04 – 2012-05-08 (×33): 1 mg via INTRAVENOUS
  Filled 2012-05-04 (×33): qty 1

## 2012-05-04 NOTE — Progress Notes (Signed)
Nutrition Brief Note  Patient identified on the Malnutrition Screening Tool (MST) Report with an MST score of 3.  Body mass index is 29.07 kg/(m^2). Patient meets criteria for Overweight based on current BMI.   Current diet order is clear liquids, patient is consuming approximately 0% of meals at this time. Pt admitted due to complaints of nausea and vomiting. Pt states she continues to feel nauseous but, no vomiting. Pt is not drinking anything due to fear of vomiting again. Pt states her usual body weight is between 175 and 185 lb. Pt weighed 180 lb 05/03/12. Pt denies wt loss and reports that she had a good appetite and was eating well before symptoms started. Pt refused supplements but, states she is interested in having popsicles. Brought pt some italian ice. Encouraged some po intake of clear liquids.  Labs and medications reviewed.   No nutrition interventions warranted at this time. If nutrition issues arise, please consult RD.   Ian Malkin RD, LDN Inpatient Clinical Dietitian Pager: [phone] After Hours Pager: [phone]

## 2012-05-04 NOTE — Plan of Care (Signed)
Problem: Phase I Progression Outcomes Goal: Pain controlled with appropriate interventions Outcome: Not Progressing Pt states only dilaudid works for her pain

## 2012-05-04 NOTE — Progress Notes (Signed)
Lab was unable to collect 0100 lithium level. 2 phlebotomists attempted to draw blood. RN got an order for lab to draw blood from foot. RN wants patient to be disturbed as little as possible since she has been falling asleep with her pain/nausea medicine. Lab told RN they would try to come to unit by 0400 when other labs are due.

## 2012-05-04 NOTE — Progress Notes (Signed)
[facility] PROGRESS NOTE  [name] ZOX:[id number] DOB: [birth year] DOA: 05/03/2012  PCP: [name], Ala Dach, MD  Brief HPI: [name] is a 47 y.o. female with a past medical history of bipolar disorder, chronic pain, who was in her usual state of health about 5 days ago, when she started having nausea and vomiting. In the last 3 days she's developed pain in the left jaw. Denied any falls or injuries. She also has some neck pain at times. Denied any chest pains. She tells me that when she stands up she feels her heart racing at times. Has had minimal shortness of breath. Emesis consisted of everything that she has taken by mouth. She's had numerous episodes. Denied any blood in the emesis. Denied any fever, but has had chills. Denied any sick contacts. She's had some abdominal pain in the lower abdomen, but denied any diarrhea. She has had nausea and vomiting on and off for the last one year. She had attempted colonoscopy last year, but had to be aborted because of poor prep. She tells me she's had multiple hospitalization for her current symptoms. Her lithium level was found to be elevated, but she denied taking more lithium than she is supposed to. Denied any suicidal ideation. The left jaw pain is a constant pain, and it radiates down the neck. Hasn't noticed any swelling in that area.  Past medical history:  Past Medical History  Diagnosis Date  . Dysrhythmia     Left sided heart diease  . Mental disorder   . Bipolar affective disorder, mixed, in partial or remission   . Headache   . Anxiety   . Renal disorder   . Kidney stones   . Depression   . 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 History of gallstones     Consultants: None  Procedures: None  Antibiotics: IV Ceftriaxone 2/17-->  Subjective: Patient complains of a headache in the right side behind the eye. Denies any vision disturbances. Jaw pain is better. Had one episode of vomiting  overnight. Denies abdominal pain.  Objective: Vital Signs  Filed Vitals:   05/03/12 1731 05/03/12 1840 05/03/12 2110 05/04/12 0521  BP: 118/79 133/73 124/88 124/79  Pulse: 98 86 93 77  Temp: 99.5 F (37.5 C) 98.4 F (36.9 C) 98.3 F (36.8 C) 98.2 F (36.8 C)  TempSrc: Oral Oral Oral Oral  Resp: 18 18 18 18   Height:  5\' 6"  (1.676 m)    Weight:  81.647 kg (180 lb)    SpO2: 96% 98% 96% 98%    Intake/Output Summary (Last 24 hours) at 05/04/12 0854 Last data filed at 05/04/12 0600  Gross per 24 hour  Intake   1030 ml  Output      0 ml  Net   1030 ml   Filed Weights   05/03/12 1840  Weight: 81.647 kg (180 lb)    General appearance: alert, cooperative, appears stated age, no distress and moderately obese Head: Normocephalic, without obvious abnormality, atraumatic Jaw: As yesterday, no abnormality noted. No swelling. No erythema of skin. Eyes: conjunctivae/corneas clear. PERRL, EOM's intact.  Resp: clear to auscultation bilaterally Cardio: regular rate and rhythm, S1, S2 normal, no murmur, click, rub or gallop GI: soft, non-tender; bowel sounds normal; no masses,  no organomegaly Extremities: extremities normal, atraumatic, no cyanosis or edema Neurologic: Alert and oriented x 3. No focal deficits.  Lab Results:  Basic Metabolic Panel:  Recent Labs Lab  05-10-2012 0730 05/04/12 0440  NA 138 141  K 3.5 4.5  CL 102 105  CO2 25 25  GLUCOSE 144* 112*  BUN 15 10  CREATININE 1.38* 0.71  CALCIUM 9.3 8.9   Liver Function Tests:  Recent Labs Lab 2012/05/10 0730 05/04/12 0440  AST 20 25  ALT 21 18  ALKPHOS 72 64  BILITOT 0.4 0.6  PROT 7.5 7.1  ALBUMIN 4.1 3.7   CBC:  Recent Labs Lab 05/10/12 0730 05/04/12 0440  WBC 7.2 6.4  NEUTROABS 5.9  --   HGB 13.5 12.3  HCT 39.2 36.5  MCV 85.8 86.1  PLT 144* 139*   Cardiac Enzymes:  Recent Labs Lab 2012/05/10 2058  TROPONINI <0.30    Studies/Results: Dg Abd 2 Views  05/10/2012  *RADIOLOGY REPORT*  Clinical  Data: Abdominal pain,, nausea vomiting  ABDOMEN - 2 VIEW  Comparison: 01/06/2012  Findings: Lung bases clear.  Prior cholecystectomy clips evident. Moderate retained stool throughout the colon.  Negative for obstruction or significant dilatation.  No osseous abnormality. Pelvic calcifications consistent with venous phleboliths.  Postop changes in the pelvis.  IMPRESSION: Negative for obstruction or ileus.  Retained stool throughout the colon.   Original Report Authenticated By: [name], M.D.     Medications:  Scheduled: . cefTRIAXone (ROCEPHIN)  IV  1 g Intravenous Q24H  . heparin  5,000 Units Subcutaneous Q8H  .  HYDROmorphone (DILAUDID) injection  1 mg Intravenous Once  . hydrOXYzine  25 mg Oral QHS  . pantoprazole (PROTONIX) IV  40 mg Intravenous Q12H  . QUEtiapine  800 mg Oral QHS  . sodium chloride  3 mL Intravenous Q12H   Continuous: . sodium chloride 100 mL/hr at 05/04/12 0444   NWG:[id number], acetaminophen, albuterol, HYDROmorphone, ondansetron (ZOFRAN) IV, ondansetron, oxyCODONE, promethazine  Assessment/Plan:  Principal Problem:   Nausea and vomiting in adult Active Problems:   Bipolar 1 disorder   Ulnar neuropathy   Dehydration   Abnormal lithium level in blood   UTI (lower urinary tract infection)   Chronic pain syndrome    Headache Likely migrainous. She mentioned it feels like her migraine. No neuro deficits. Try dilaudid.   Nausea and vomiting The etiology is unclear. Could be related to the UTI or constipation. No further episodes since last night. Try clear liquids. There is no evidence of obstruction on the abdominal film.   Dehydration with mildly elevated creatinine Improved with IVF.   Left-sided jaw pain Improved per patient. Troponin is normal. No obvious abnormality on examination. Since it is better we will continue to monitor.   Elevated lithium level Levels have improved. Could have been high due to acute illness and dehydration. Will  resume and recheck level tomorrow. No need to alter dose.   Abnormal UA/UTI Cultures will be followed up on. She'll be kept on ceftriaxone for now.   History of bipolar disorder Continue with her home medications. Resume Lithium.   Constipation This will need to be addressed once her current symptoms have improved. TSH is normal.   DVT Prophylaxis: Heparin  Code Status: Full code  Family Communication: Discussed with the patient  Disposition Plan: Will likely return home when improved    LOS: 1 day   [facility]  [facility] Pager [phone] 05/04/2012, 8:54 AM  If 8PM-8AM, please contact night-coverage at [email or web address], password [facility]

## 2012-05-04 NOTE — Progress Notes (Signed)
   CARE MANAGEMENT NOTE 05/04/2012  Patient:  [name], [name]   Account Number:  [id number]  Date Initiated:  05/04/2012  Documentation initiated by:  Jiles Crocker  Subjective/Objective Assessment:   ADMITTED WITH VOMITING, ABDOMINAL PAIN     Action/Plan:   PCP: Estill Cotta, MD  Specialists: She is followed by a pain specialist, Dr. Vear Clock. Her psychiatrist is in [place].  LIVES WITH HER SPOUSE AND CHILDREN   Anticipated DC Date:  05/07/2012   Anticipated DC Plan:  HOME/SELF CARE      DC Planning Services  CM consult          Status of service:  In process, will continue to follow Medicare Important Message given?  NA - LOS <3 / Initial given by admissions (If response is "NO", the following Medicare IM given date fields will be blank) Per UR Regulation:  Reviewed for med. necessity/level of care/duration of stay Comments:  05/04/2012- B Tamyka Bezio RN,BSN,MHA

## 2012-05-05 LAB — BASIC METABOLIC PANEL
CO2: 23 mEq/L (ref 19–32)
Chloride: 107 mEq/L (ref 96–112)
Creatinine, Ser: 0.71 mg/dL (ref 0.50–1.10)
GFR calc Af Amer: >90 mL/min (ref >90)
Potassium: 3.6 mEq/L (ref 3.5–5.1)
Sodium: 140 mEq/L (ref 135–145)

## 2012-05-05 LAB — LITHIUM LEVEL: Lithium Lvl: 0.17 mEq/L — ABNORMAL LOW (ref 0.80–1.40)

## 2012-05-05 LAB — CBC
Platelets: 106 10*3/uL — ABNORMAL LOW (ref 150–400)
RBC: 3.59 MIL/uL — ABNORMAL LOW (ref 3.87–5.11)
RDW: 12.5 % (ref 11.5–15.5)
WBC: 3.9 10*3/uL — ABNORMAL LOW (ref 4.0–10.5)

## 2012-05-05 MED ORDER — METOCLOPRAMIDE HCL 5 MG/ML IJ SOLN
5.0000 mg | Freq: Three times a day (TID) | INTRAMUSCULAR | Status: DC
  Administered 2012-05-05 – 2012-05-08 (×10): 5 mg via INTRAVENOUS
  Filled 2012-05-05 (×2): qty 1
  Filled 2012-05-05: qty 2
  Filled 2012-05-05 (×6): qty 1
  Filled 2012-05-05: qty 2
  Filled 2012-05-05: qty 1

## 2012-05-05 NOTE — Progress Notes (Addendum)
[facility] PROGRESS NOTE  [name] ZOX:[id number] DOB: October 01, 1965 DOA: 05/03/2012  PCP: [name], Ala Dach, MD  Brief HPI: [name] is a 47 y.o. female with a past medical history of bipolar disorder, chronic pain, who was in her usual state of health about 5 days ago, when she started having nausea and vomiting. In the last 3 days she's developed pain in the left jaw. Denied any falls or injuries. She also has some neck pain at times. Denied any chest pains. She tells me that when she stands up she feels her heart racing at times. Has had minimal shortness of breath. Emesis consisted of everything that she has taken by mouth. She's had numerous episodes. Denied any blood in the emesis. Denied any fever, but has had chills. Denied any sick contacts. She's had some abdominal pain in the lower abdomen, but denied any diarrhea. She has had nausea and vomiting on and off for the last one year. She had attempted colonoscopy last year, but had to be aborted because of poor prep. She tells me she's had multiple hospitalization for her current symptoms. Her lithium level was found to be elevated, but she denied taking more lithium than she is supposed to. Denied any suicidal ideation. The left jaw pain is a constant pain, and it radiates down the neck. Hasn't noticed any swelling in that area.  Assessment/Plan:   Nausea and vomiting The etiology is unclear. Could be related to the UTI or constipation.  -continue clears for today, add reglan -add laxative/enema for constipation -continue rocehin for a total of 3days Dehydration with mildly elevated creatinine improved with IVF.   Left-sided jaw pain Improved per patient. Troponin is normal. No obvious abnormality on examination. Since it is better we will continue to monitor.   Elevated lithium level Levels were high initially but now low. Could have been high due to acute illness and dehydration  Abnormal UA/UTI Cultures only  with multiple bacterial morphotypes - will complete 3days of rocephin and DC  History of bipolar disorder Continue  her home medications - continue Lithium.   Constipation -add laxative, enema as above and follow. TSH is normal.   Headache ? migrainous.  No neuro deficits.  -continue pain management, follow  DVT Prophylaxis: Heparin  Code Status: Full code  Family Communication: Discussed with the patient  Disposition Plan: Will likely return home when improved   Consultants: None  Procedures: None  Antibiotics: IV Ceftriaxone 2/17--2/19  Subjective: States still with nausea, no furthervomiting, Denies abdominal pain.  Objective: Vital Signs  Filed Vitals:   05/04/12 1408 05/04/12 2215 05/05/12 0555 05/05/12 1410  BP: 128/71 120/75 127/83 128/75  Pulse: 84 69 68 68  Temp: 98 F (36.7 C) 98.5 F (36.9 C) 98.6 F (37 C) 98.2 F (36.8 C)  TempSrc: Oral Oral Oral Oral  Resp: 18 20 18 18   Height:      Weight:      SpO2: 100% 96% 96% 96%    Intake/Output Summary (Last 24 hours) at 05/05/12 1750 Last data filed at 05/05/12 1500  Gross per 24 hour  Intake 2668.33 ml  Output      0 ml  Net 2668.33 ml   Filed Weights   05/03/12 1840  Weight: 81.647 kg (180 lb)    General appearance: alert, cooperative, appears stated age, no distress and moderately obese Head: Normocephalic, without obvious abnormality, atraumatic Jaw: no abnormality noted. No swelling. No erythema of skin. Eyes: conjunctivae/corneas clear.  PERRL, EOM's intact.  Resp: clear to auscultation bilaterally Cardio: regular rate and rhythm, S1, S2 normal, no murmur, click, rub or gallop GI: soft, non-tender; bowel sounds normal; no masses,  no organomegaly Extremities: extremities normal, atraumatic, no cyanosis or edema Neurologic: Alert and oriented x 3. No focal deficits.  Lab Results:  Basic Metabolic Panel:  Recent Labs Lab 05/03/12 0730 05/04/12 0440 05/05/12 0440  NA 138 141 140  K  3.5 4.5 3.6  CL 102 105 107  CO2 25 25 23   GLUCOSE 144* 112* 94  BUN 15 10 11   CREATININE 1.38* 0.71 0.71  CALCIUM 9.3 8.9 8.0*   Liver Function Tests:  Recent Labs Lab 05/03/12 0730 05/04/12 0440  AST 20 25  ALT 21 18  ALKPHOS 72 64  BILITOT 0.4 0.6  PROT 7.5 7.1  ALBUMIN 4.1 3.7   CBC:  Recent Labs Lab 05/03/12 0730 05/04/12 0440 05/05/12 0440  WBC 7.2 6.4 3.9*  NEUTROABS 5.9  --   --   HGB 13.5 12.3 10.3*  HCT 39.2 36.5 30.9*  MCV 85.8 86.1 86.1  PLT 144* 139* 106*   Cardiac Enzymes:  Recent Labs Lab 05/03/12 2058  TROPONINI <0.30    Studies/Results: No results found.  Medications:  Scheduled: . cefTRIAXone (ROCEPHIN)  IV  1 g Intravenous Q24H  . heparin  5,000 Units Subcutaneous Q8H  .  HYDROmorphone (DILAUDID) injection  1 mg Intravenous Once  . hydrOXYzine  25 mg Oral QHS  . lithium carbonate  300 mg Oral QHS  . pantoprazole (PROTONIX) IV  40 mg Intravenous Q12H  . QUEtiapine  800 mg Oral QHS  . sodium chloride  3 mL Intravenous Q12H   Continuous: . sodium chloride 100 mL/hr at 05/05/12 1608   ZOX:[id number], acetaminophen, albuterol, HYDROmorphone, ondansetron (ZOFRAN) IV, ondansetron, oxyCODONE, promethazine  Assessment/Plan:  Principal Problem:   Nausea and vomiting in adult Active Problems:   Bipolar 1 disorder   Ulnar neuropathy   Dehydration   Abnormal lithium level in blood   UTI (lower urinary tract infection)   Chronic pain syndrome      LOS: 2 days   [name]  [facility] Pager [phone]  05/05/2012, 5:50 PM  If 8PM-8AM, please contact night-coverage at [email or web address], password [facility]

## 2012-05-06 DIAGNOSIS — K5900 Constipation, unspecified: Secondary | ICD-10-CM

## 2012-05-06 LAB — BASIC METABOLIC PANEL
BUN: 9 mg/dL (ref 6–23)
Chloride: 111 mEq/L (ref 96–112)
GFR calc Af Amer: >90 mL/min (ref >90)
GFR calc non Af Amer: >90 mL/min (ref >90)
Glucose, Bld: 92 mg/dL (ref 70–99)
Potassium: 3.5 mEq/L (ref 3.5–5.1)
Sodium: 141 mEq/L (ref 135–145)

## 2012-05-06 LAB — CBC
HCT: 29.8 % — ABNORMAL LOW (ref 36.0–46.0)
Hemoglobin: 10.4 g/dL — ABNORMAL LOW (ref 12.0–15.0)
MCHC: 34.9 g/dL (ref 30.0–36.0)
WBC: 4.8 10*3/uL (ref 4.0–10.5)

## 2012-05-06 MED ORDER — BISACODYL 10 MG RE SUPP
10.0000 mg | Freq: Every day | RECTAL | Status: DC | PRN
  Filled 2012-05-06: qty 1

## 2012-05-06 NOTE — Progress Notes (Signed)
[facility] PROGRESS NOTE  [name] JYN:[id number] DOB: 1965/08/24 DOA: 05/03/2012  PCP: Letitia Libra, Ala Dach, MD  Brief HPI: [name] is a 47 y.o. female with a past medical history of bipolar disorder, chronic pain, who was in her usual state of health about 5 days ago, when she started having nausea and vomiting. In the last 3 days she's developed pain in the left jaw. Denied any falls or injuries. She also has some neck pain at times. Denied any chest pains. She tells me that when she stands up she feels her heart racing at times. Has had minimal shortness of breath. Emesis consisted of everything that she has taken by mouth. She's had numerous episodes. Denied any blood in the emesis. Denied any fever, but has had chills. Denied any sick contacts. She's had some abdominal pain in the lower abdomen, but denied any diarrhea. She has had nausea and vomiting on and off for the last one year. She had attempted colonoscopy last year, but had to be aborted because of poor prep. She tells me she's had multiple hospitalization for her current symptoms. Her lithium level was found to be elevated, but she denied taking more lithium than she is supposed to. Denied any suicidal ideation. The left jaw pain is a constant pain, and it radiates down the neck. Hasn't noticed any swelling in that area.  Assessment/Plan:   Nausea and vomiting The etiology is unclear. Could be related to the UTI or constipation.  -continue reglan, nausea improving>>advance diet as tol -on laxative/enema for constipation -improving -s/p rocephin for a total of 3days Dehydration with mildly elevated creatinine improved with IVF.   Left-sided jaw pain Improved per patient. Troponin is normal. No obvious abnormality on examination. Since it is better we will continue to monitor.   Elevated lithium level Levels were high initially but now low. Could have been high due to acute illness and  dehydration  Abnormal UA/UTI Cultures only with multiple bacterial morphotypes - s/p 3days of rocephin   History of bipolar disorder Continue  her home medications - continue Lithium.   Constipation -resolving even without laxatives, follow. TSH is normal.   Headache ? migrainous.  No neuro deficits.  -continue pain management, follow  DVT Prophylaxis: Heparin  Code Status: Full code  Family Communication: Discussed with the patient  Disposition Plan: Will likely return home when improved   Consultants: None  Procedures: None  Antibiotics: IV Ceftriaxone 2/17--2/19  Subjective: States nausea a little less today, +BM x2  Objective: Vital Signs  Filed Vitals:   05/05/12 1410 05/05/12 2119 05/06/12 0456 05/06/12 1400  BP: 128/75 126/85 136/83 135/73  Pulse: 68 58 64 65  Temp: 98.2 F (36.8 C) 98.6 F (37 C) 98.8 F (37.1 C) 98.7 F (37.1 C)  TempSrc: Oral Oral Oral Oral  Resp: 18 18 18 18   Height:      Weight:      SpO2: 96% 98% 99% 99%    Intake/Output Summary (Last 24 hours) at 05/06/12 1606 Last data filed at 05/06/12 1500  Gross per 24 hour  Intake    940 ml  Output      0 ml  Net    940 ml   Filed Weights   05/03/12 1840  Weight: 81.647 kg (180 lb)    General appearance: alert, cooperative, appears stated age, no distress and moderately obese Head: Normocephalic, without obvious abnormality, atraumatic Jaw: no abnormality noted. No swelling. No erythema of skin.  Eyes: conjunctivae/corneas clear. PERRL, EOM's intact.  Resp: clear to auscultation bilaterally Cardio: regular rate and rhythm, S1, S2 normal, no murmur, click, rub or gallop GI: soft, non-tender; bowel sounds normal; no masses,  no organomegaly Extremities: extremities normal, atraumatic, no cyanosis or edema Neurologic: Alert and oriented x 3. No focal deficits.  Lab Results:  Basic Metabolic Panel:  Recent Labs Lab 05/03/12 0730 05/04/12 0440 05/05/12 0440 05/06/12 0250   NA 138 141 140 141  K 3.5 4.5 3.6 3.5  CL 102 105 107 111  CO2 25 25 23 22   GLUCOSE 144* 112* 94 92  BUN 15 10 11 9   CREATININE 1.38* 0.71 0.71 0.76  CALCIUM 9.3 8.9 8.0* 8.1*   Liver Function Tests:  Recent Labs Lab 05/03/12 0730 05/04/12 0440  AST 20 25  ALT 21 18  ALKPHOS 72 64  BILITOT 0.4 0.6  PROT 7.5 7.1  ALBUMIN 4.1 3.7   CBC:  Recent Labs Lab 05/03/12 0730 05/04/12 0440 05/05/12 0440 05/06/12 0250  WBC 7.2 6.4 3.9* 4.8  NEUTROABS 5.9  --   --   --   HGB 13.5 12.3 10.3* 10.4*  HCT 39.2 36.5 30.9* 29.8*  MCV 85.8 86.1 86.1 83.2  PLT 144* 139* 106* 108*   Cardiac Enzymes:  Recent Labs Lab 05/03/12 2058  TROPONINI <0.30    Studies/Results: No results found.  Medications:  Scheduled: . heparin  5,000 Units Subcutaneous Q8H  .  HYDROmorphone (DILAUDID) injection  1 mg Intravenous Once  . hydrOXYzine  25 mg Oral QHS  . lithium carbonate  300 mg Oral QHS  . metoCLOPramide (REGLAN) injection  5 mg Intravenous TID AC  . pantoprazole (PROTONIX) IV  40 mg Intravenous Q12H  . QUEtiapine  800 mg Oral QHS  . sodium chloride  3 mL Intravenous Q12H   Continuous: . sodium chloride 100 mL/hr at 05/06/12 1243   RUE:[id number], acetaminophen, albuterol, bisacodyl, HYDROmorphone, ondansetron (ZOFRAN) IV, ondansetron, oxyCODONE, promethazine  Assessment/Plan:  Principal Problem:   Nausea and vomiting in adult Active Problems:   Bipolar 1 disorder   Ulnar neuropathy   Dehydration   Abnormal lithium level in blood   UTI (lower urinary tract infection)   Chronic pain syndrome      LOS: 3 days   [name]  [facility] Pager [phone]  05/06/2012, 4:06 PM  If 8PM-8AM, please contact night-coverage at [email or web address], password [facility]

## 2012-05-06 NOTE — Progress Notes (Signed)
Patient's IV is out of date by midnight tonight. She was put on the list for a routine restart by IV team and they were unable to start a new site due to poor access.  Will report off to oncoming shift.

## 2012-05-07 NOTE — Progress Notes (Signed)
[facility] PROGRESS NOTE  KIJANA ESTOCK ZOX:[id number] DOB: 01-Feb-1966 DOA: 05/03/2012  PCP: Letitia Libra, Ala Dach, MD  Brief HPI: [name] is a 47 y.o. female with a past medical history of bipolar disorder, chronic pain, who was in her usual state of health about 5 days ago, when she started having nausea and vomiting. In the last 3 days she's developed pain in the left jaw. Denied any falls or injuries. She also has some neck pain at times. Denied any chest pains. She tells me that when she stands up she feels her heart racing at times. Has had minimal shortness of breath. Emesis consisted of everything that she has taken by mouth. She's had numerous episodes. Denied any blood in the emesis. Denied any fever, but has had chills. Denied any sick contacts. She's had some abdominal pain in the lower abdomen, but denied any diarrhea. She has had nausea and vomiting on and off for the last one year. She had attempted colonoscopy last year, but had to be aborted because of poor prep. She tells me she's had multiple hospitalization for her current symptoms. Her lithium level was found to be elevated, but she denied taking more lithium than she is supposed to. Denied any suicidal ideation. The left jaw pain is a constant pain, and it radiates down the neck. Hasn't noticed any swelling in that area.  Assessment/Plan:   Nausea and vomiting The etiology is unclear. Could be related to the UTI or constipation.  -continue reglan, nausea improving>>advance diet to full liquids -on laxative/enema for constipation -improving -s/p rocephin for a total of 3days Dehydration with mildly elevated creatinine improved with IVF.   Left-sided jaw pain resolved. Troponin is normal. No obvious abnormality on examination. Since it is better we will continue to monitor.   Elevated lithium level Levels were high initially but now low. Could have been high due to acute illness and dehydration  Abnormal  UA/UTI Cultures only with multiple bacterial morphotypes - s/p 3days of rocephin   History of bipolar disorder Continue  her home medications - continue Lithium.   Constipation -resolving even without laxatives, follow. TSH is normal.   Headache ? migrainous.  No neuro deficits.  -continue pain management, follow  DVT Prophylaxis: Heparin  Code Status: Full code  Family Communication: Discussed with the patient  Disposition Plan: Will likely return home when improved   Consultants: None  Procedures: None  Antibiotics: IV Ceftriaxone 2/17--2/19  Subjective: States feels better today, per nsg did not want diet advanced yesterday, but agrees to try full liquids today.  Objective: Vital Signs  Filed Vitals:   05/06/12 0456 05/06/12 1400 05/06/12 2143 05/07/12 0513  BP: 136/83 135/73 141/90 141/87  Pulse: 64 65 62 58  Temp: 98.8 F (37.1 C) 98.7 F (37.1 C) 98 F (36.7 C) 98.4 F (36.9 C)  TempSrc: Oral Oral Oral Oral  Resp: 18 18 18 18   Height:      Weight:      SpO2: 99% 99% 100% 100%    Intake/Output Summary (Last 24 hours) at 05/07/12 1107 Last data filed at 05/07/12 0500  Gross per 24 hour  Intake   2300 ml  Output      0 ml  Net   2300 ml   Filed Weights   05/03/12 1840  Weight: 81.647 kg (180 lb)    General appearance: alert, cooperative, appears stated age, no distress and moderately obese Head: Normocephalic, without obvious abnormality, atraumatic Jaw: no  abnormality noted. No swelling. No erythema of skin. Eyes: conjunctivae/corneas clear. PERRL, EOM's intact.  Resp: clear to auscultation bilaterally Cardio: regular rate and rhythm, S1, S2 normal, no murmur, click, rub or gallop GI: soft, non-tender; bowel sounds normal; no masses,  no organomegaly Extremities: extremities normal, atraumatic, no cyanosis or edema Neurologic: Alert and oriented x 3. No focal deficits.  Lab Results:  Basic Metabolic Panel:  Recent Labs Lab 05/03/12 0730  05/04/12 0440 05/05/12 0440 05/06/12 0250  NA 138 141 140 141  K 3.5 4.5 3.6 3.5  CL 102 105 107 111  CO2 25 25 23 22   GLUCOSE 144* 112* 94 92  BUN 15 10 11 9   CREATININE 1.38* 0.71 0.71 0.76  CALCIUM 9.3 8.9 8.0* 8.1*   Liver Function Tests:  Recent Labs Lab 05/03/12 0730 05/04/12 0440  AST 20 25  ALT 21 18  ALKPHOS 72 64  BILITOT 0.4 0.6  PROT 7.5 7.1  ALBUMIN 4.1 3.7   CBC:  Recent Labs Lab 05/03/12 0730 05/04/12 0440 05/05/12 0440 05/06/12 0250  WBC 7.2 6.4 3.9* 4.8  NEUTROABS 5.9  --   --   --   HGB 13.5 12.3 10.3* 10.4*  HCT 39.2 36.5 30.9* 29.8*  MCV 85.8 86.1 86.1 83.2  PLT 144* 139* 106* 108*   Cardiac Enzymes:  Recent Labs Lab 05/03/12 2058  TROPONINI <0.30    Studies/Results: No results found.  Medications:  Scheduled: . heparin  5,000 Units Subcutaneous Q8H  .  HYDROmorphone (DILAUDID) injection  1 mg Intravenous Once  . hydrOXYzine  25 mg Oral QHS  . lithium carbonate  300 mg Oral QHS  . metoCLOPramide (REGLAN) injection  5 mg Intravenous TID AC  . pantoprazole (PROTONIX) IV  40 mg Intravenous Q12H  . QUEtiapine  800 mg Oral QHS  . sodium chloride  3 mL Intravenous Q12H   Continuous: . sodium chloride 1,000 mL (05/07/12 0702)   XBJ:[id number], acetaminophen, albuterol, bisacodyl, HYDROmorphone, ondansetron (ZOFRAN) IV, ondansetron, oxyCODONE, promethazine  Assessment/Plan:  Principal Problem:   Nausea and vomiting in adult Active Problems:   Bipolar 1 disorder   Ulnar neuropathy   Dehydration   Abnormal lithium level in blood   UTI (lower urinary tract infection)   Chronic pain syndrome      LOS: 4 days   [name]  [facility] Pager [phone]  05/07/2012, 11:07 AM  If 8PM-8AM, please contact night-coverage at [email or web address], password [facility]

## 2012-05-08 LAB — BASIC METABOLIC PANEL
BUN: 4 mg/dL — ABNORMAL LOW (ref 6–23)
CO2: 23 mEq/L (ref 19–32)
Chloride: 109 mEq/L (ref 96–112)
GFR calc Af Amer: >90 mL/min (ref >90)
Potassium: 2.9 mEq/L — ABNORMAL LOW (ref 3.5–5.1)

## 2012-05-08 MED ORDER — POTASSIUM CHLORIDE CRYS ER 20 MEQ PO TBCR
60.0000 meq | EXTENDED_RELEASE_TABLET | Freq: Once | ORAL | Status: AC
  Administered 2012-05-08: 60 meq via ORAL
  Filled 2012-05-08: qty 3

## 2012-05-08 MED ORDER — PROMETHAZINE HCL 12.5 MG PO TABS
12.5000 mg | ORAL_TABLET | Freq: Four times a day (QID) | ORAL | Status: DC | PRN
Start: 2012-05-08 — End: 2012-07-28

## 2012-05-08 MED ORDER — METOCLOPRAMIDE HCL 5 MG PO TABS: 5.0000 mg | ORAL_TABLET | Freq: Four times a day (QID) | ORAL | Status: DC | PRN

## 2012-05-08 MED ORDER — PANTOPRAZOLE SODIUM 40 MG IV SOLR: 40.0000 mg | Freq: Two times a day (BID) | INTRAVENOUS | Status: DC

## 2012-05-08 MED ORDER — POTASSIUM CHLORIDE ER 10 MEQ PO TBCR: 20.0000 meq | EXTENDED_RELEASE_TABLET | Freq: Two times a day (BID) | ORAL | Status: DC

## 2012-05-08 MED ORDER — BISACODYL 10 MG RE SUPP: 10.0000 mg | Freq: Every day | RECTAL | Status: DC | PRN

## 2012-05-08 MED ORDER — OMEPRAZOLE 40 MG PO CPDR: 40.0000 mg | DELAYED_RELEASE_CAPSULE | Freq: Two times a day (BID) | ORAL | Status: DC

## 2012-05-08 NOTE — Discharge Summary (Addendum)
Physician Discharge Summary  MAISON AGRUSA WRU:[id number] DOB: [birth year] DOA: 05/03/2012  PCP: Estill Cotta, MD  Admit date: 05/03/2012 Discharge date: 05/08/2012  Time spent: >30 minutes  Recommendations for Outpatient Follow-up:   Discharge Diagnoses:  Principal Problem:   Nausea and vomiting in adult Active Problems:   Bipolar 1 disorder   Ulnar neuropathy   Dehydration   Abnormal lithium level in blood   UTI (lower urinary tract infection)   Chronic pain syndrome   Discharge Condition: Improved/stable  Diet recommendation: Regular  Filed Weights   05/03/12 1840  Weight: 81.647 kg (180 lb)    History of present illness:  [name] is a 47 y.o. female with a past medical history of bipolar disorder, chronic pain, who was in her usual state of health about 5 days ago, when she started having nausea and vomiting. In the last 3 days she's developed pain in the left jaw. Denies any falls or injuries. She also has some neck pain at times. Denies any chest pains. She tells me that when she stands up she feels her heart racing at times. Has had minimal shortness of breath. Emesis consisted of everything that she has taken by mouth. She's had numerous episodes. Denies any blood in the emesis. Denies any fever, but has had chills. Denies any sick contacts. She's had some abdominal pain in the lower abdomen, but denies any diarrhea. She has had nausea and vomiting on and off for the last one year. She had attempted colonoscopy last year, but had to be aborted because of poor prep. She tells me she's had multiple hospitalization for her current symptoms. Her lithium level was found to be elevated, but she denies taking more lithium than she is supposed to. Denies any suicidal ideation. The left jaw pain is a constant pain, and it radiates down the neck. Hasn't noticed any swelling in that area.      Hospital Course:  Nausea and vomiting  The etiology is unclear but  likely secondary to constipation and probable acute cystitis  -Upon admission patient was kept n.p.o. and hydrated with IV fluids also managed supportively with antiemetics and Reglan. Laxative/enema is ordered for constipation, and she had bowel movements in the hospital. She was also treated with antibiotics for possible cystitis/UTI. Her nausea vomiting improved and she was started on clears and advanced to solids today which she tolerated well. She be discharged at this time for outpatient followup. Dehydration with mildly elevated creatinine  improved with IVF.  Left-sided jaw pain  resolved. Troponin is normal. No obvious abnormality on examination.  Elevated lithium level  Levels were high initially but now low. Could have been high due to acute illness and dehydration  Abnormal UA/UTI/acute cystitis  Urinalysis on admission was consistent with a UTI.she was treated with empiric Rocephin x3 days. Cultures only with multiple bacterial morphotypes -  History of bipolar disorder  Continue her home medications - continue Lithium.  Constipation  -See #1 above, resolved. Headache  ? migrainous. No neuro deficits.  -continue pain management, follow Hypokalemia -Potassium was replaced in the hospital. she is also been discharged on oral potassium for 2 more days and she is to followup with her PCP.  Procedures:  none  Consultations:  none  Discharge Exam: Filed Vitals:   05/07/12 1400 05/07/12 2157 05/08/12 0447 05/08/12 0849  BP: 142/84 117/72 139/82 139/83  Pulse: 67 81 89 82  Temp: 98.2 F (36.8 C) 98.1 F (36.7 C) 98.1  F (36.7 C) 100.1 F (37.8 C)  TempSrc: Oral Oral Oral Oral  Resp: 18 18 18 16   Height:      Weight:      SpO2: 97% 98% 95% 96%   General appearance: alert, cooperative, appears stated age, no distress and moderately obese  Head: Normocephalic, without obvious abnormality, atraumatic  Jaw: no abnormality noted. No swelling. No erythema of skin.  Eyes:  conjunctivae/corneas clear. PERRL, EOM's intact.  Resp: clear to auscultation bilaterally  Cardio: regular rate and rhythm, S1, S2 normal, no murmur, click, rub or gallop  GI: soft, non-tender; bowel sounds normal; no masses, no organomegaly  Extremities: extremities normal, atraumatic, no cyanosis or edema  Neurologic: Alert and oriented x 3. No focal deficits.    Discharge Instructions  Discharge Orders   Future Orders Complete By Expires     Diet general  As directed     Increase activity slowly  As directed         Medication List    TAKE these medications       AMBIEN 10 MG tablet  Generic drug:  zolpidem  Take 10 mg by mouth at bedtime. For sleep     bisacodyl 10 MG suppository  Commonly known as:  DULCOLAX  Place 1 suppository (10 mg total) rectally daily as needed.     hydrOXYzine 25 MG capsule  Commonly known as:  VISTARIL  Take 25 mg by mouth at bedtime.     lithium carbonate 300 MG CR tablet  Commonly known as:  LITHOBID  Take 300 mg by mouth daily.     metoCLOPramide 5 MG tablet  Commonly known as:  REGLAN  Take 1 tablet (5 mg total) by mouth 4 (four) times daily as needed.     oxyCODONE 5 MG immediate release tablet  Commonly known as:  Oxy IR/ROXICODONE  Take 5 mg by mouth every 4 (four) hours as needed.      omeprazole 40 mg by mouth twice a day   Commonly known as: Prilosec   I take 40 mg by mouth twice a day      potassium chloride 10 MEQ tablet  Commonly known as:  K-DUR  Take 2 tablets (20 mEq total) by mouth 2 (two) times daily.     promethazine 12.5 MG tablet  Commonly known as:  PHENERGAN  Take 1 tablet (12.5 mg total) by mouth every 6 (six) hours as needed for nausea.     QUEtiapine 400 MG tablet  Commonly known as:  SEROQUEL  Take 800 mg by mouth at bedtime.     traZODone 100 MG tablet  Commonly known as:  DESYREL  Take 400 mg by mouth at bedtime.           Follow-up Information   Follow up with Letitia Libra, Ala Dach,  MD. (in 1-2weeks,call for appt upon discharge)    Contact information:   [address] [place] [address] [phone]        The results of significant diagnostics from this hospitalization (including imaging, microbiology, ancillary and laboratory) are listed below for reference.    Significant Diagnostic Studies: Dg Abd 2 Views  05/03/2012  *RADIOLOGY REPORT*  Clinical Data: Abdominal pain,, nausea vomiting  ABDOMEN - 2 VIEW  Comparison: 01/06/2012  Findings: Lung bases clear.  Prior cholecystectomy clips evident. Moderate retained stool throughout the colon.  Negative for obstruction or significant dilatation.  No osseous abnormality. Pelvic calcifications consistent with venous phleboliths.  Postop changes in the pelvis.  IMPRESSION: Negative for obstruction or ileus.  Retained stool throughout the colon.   Original Report Authenticated By: Judie Petit. Miles Costain, M.D.     Microbiology: Recent Results (from the past 240 hour(s))  URINE CULTURE     Status: None   Collection Time    05/03/12  7:19 AM      Result Value Range Status   Specimen Description URINE, CLEAN CATCH   Final   Special Requests NONE   Final   Culture  Setup Time 05/03/2012 12:47   Final   Colony Count 30,000 COLONIES/ML   Final   Culture     Final   Value: Multiple bacterial morphotypes present, none predominant. Suggest appropriate recollection if clinically indicated.   Report Status 05/04/2012 FINAL   Final     Labs: Basic Metabolic Panel:  Recent Labs Lab 05/03/12 0730 05/04/12 0440 05/05/12 0440 05/06/12 0250 05/08/12 0457  NA 138 141 140 141 142  K 3.5 4.5 3.6 3.5 2.9*  CL 102 105 107 111 109  CO2 25 25 23 22 23   GLUCOSE 144* 112* 94 92 90  BUN 15 10 11 9  4*  CREATININE 1.38* 0.71 0.71 0.76 0.71  CALCIUM 9.3 8.9 8.0* 8.1* 8.0*   Liver Function Tests:  Recent Labs Lab 05/03/12 0730 05/04/12 0440  AST 20 25  ALT 21 18  ALKPHOS 72 64  BILITOT 0.4 0.6  PROT 7.5 7.1  ALBUMIN 4.1 3.7    No results found for this basename: LIPASE, AMYLASE,  in the last 168 hours No results found for this basename: AMMONIA,  in the last 168 hours CBC:  Recent Labs Lab 05/03/12 0730 05/04/12 0440 05/05/12 0440 05/06/12 0250  WBC 7.2 6.4 3.9* 4.8  NEUTROABS 5.9  --   --   --   HGB 13.5 12.3 10.3* 10.4*  HCT 39.2 36.5 30.9* 29.8*  MCV 85.8 86.1 86.1 83.2  PLT 144* 139* 106* 108*   Cardiac Enzymes:  Recent Labs Lab 05/03/12 2058  TROPONINI <0.30   BNP: BNP (last 3 results) No results found for this basename: PROBNP,  in the last 8760 hours CBG: No results found for this basename: GLUCAP,  in the last 168 hours     Signed:  [name]  [facility] 05/08/2012, 3:58 PM

## 2012-07-28 ENCOUNTER — Emergency Department (HOSPITAL_COMMUNITY)
Admission: EM | Admit: 2012-07-28 | Discharge: 2012-07-29 | Disposition: A | Discharge status: Home / Self Care | Payer: Medicare Other | Attending: Emergency Medicine | Admitting: Emergency Medicine

## 2012-07-28 ENCOUNTER — Encounter (HOSPITAL_COMMUNITY): Payer: Self-pay | Admitting: *Deleted

## 2012-07-28 ENCOUNTER — Emergency Department (HOSPITAL_COMMUNITY): Payer: Medicare Other

## 2012-07-28 DIAGNOSIS — M549 Dorsalgia, unspecified: Secondary | ICD-10-CM | POA: Insufficient documentation

## 2012-07-28 DIAGNOSIS — Z87442 Personal history of urinary calculi: Secondary | ICD-10-CM | POA: Insufficient documentation

## 2012-07-28 DIAGNOSIS — Z8679 Personal history of other diseases of the circulatory system: Secondary | ICD-10-CM | POA: Insufficient documentation

## 2012-07-28 DIAGNOSIS — F3289 Other specified depressive episodes: Secondary | ICD-10-CM | POA: Insufficient documentation

## 2012-07-28 DIAGNOSIS — F319 Bipolar disorder, unspecified: Secondary | ICD-10-CM | POA: Insufficient documentation

## 2012-07-28 DIAGNOSIS — F411 Generalized anxiety disorder: Secondary | ICD-10-CM | POA: Insufficient documentation

## 2012-07-28 DIAGNOSIS — Z79899 Other long term (current) drug therapy: Secondary | ICD-10-CM | POA: Insufficient documentation

## 2012-07-28 DIAGNOSIS — F329 Major depressive disorder, single episode, unspecified: Secondary | ICD-10-CM | POA: Insufficient documentation

## 2012-07-28 DIAGNOSIS — Z87448 Personal history of other diseases of urinary system: Secondary | ICD-10-CM | POA: Insufficient documentation

## 2012-07-28 DIAGNOSIS — R112 Nausea with vomiting, unspecified: Secondary | ICD-10-CM

## 2012-07-28 LAB — COMPREHENSIVE METABOLIC PANEL
ALT: 30 U/L (ref 0–35)
AST: 28 U/L (ref 0–37)
CO2: 26 mEq/L (ref 19–32)
Chloride: 102 mEq/L (ref 96–112)
Creatinine, Ser: 0.84 mg/dL (ref 0.50–1.10)
GFR calc non Af Amer: 82 mL/min — ABNORMAL LOW (ref >90)
Total Bilirubin: 0.4 mg/dL (ref 0.3–1.2)

## 2012-07-28 LAB — CBC WITH DIFFERENTIAL/PLATELET
Basophils Absolute: 0 10*3/uL (ref 0.0–0.1)
HCT: 35.4 % — ABNORMAL LOW (ref 36.0–46.0)
Hemoglobin: 12.2 g/dL (ref 12.0–15.0)
Lymphocytes Relative: 27 % (ref 12–46)
Monocytes Absolute: 0.3 10*3/uL (ref 0.1–1.0)
Neutro Abs: 3.5 10*3/uL (ref 1.7–7.7)
RBC: 4.26 MIL/uL (ref 3.87–5.11)
RDW: 13.7 % (ref 11.5–15.5)
WBC: 5.2 10*3/uL (ref 4.0–10.5)

## 2012-07-28 LAB — URINALYSIS, ROUTINE W REFLEX MICROSCOPIC
Glucose, UA: NEGATIVE mg/dL
Hgb urine dipstick: NEGATIVE
Ketones, ur: NEGATIVE mg/dL
Protein, ur: NEGATIVE mg/dL

## 2012-07-28 MED ORDER — PROMETHAZINE HCL 25 MG/ML IJ SOLN
25.0000 mg | Freq: Once | INTRAMUSCULAR | Status: AC
  Administered 2012-07-28: 25 mg via INTRAVENOUS
  Filled 2012-07-28: qty 1

## 2012-07-28 MED ORDER — PROMETHAZINE HCL 12.5 MG PO TABS: 12.5000 mg | ORAL_TABLET | Freq: Four times a day (QID) | ORAL | Status: DC | PRN

## 2012-07-28 MED ORDER — HYDROMORPHONE HCL PF 1 MG/ML IJ SOLN
1.0000 mg | Freq: Once | INTRAMUSCULAR | Status: AC
  Administered 2012-07-28: 1 mg via INTRAVENOUS
  Filled 2012-07-28: qty 1

## 2012-07-28 MED ORDER — SODIUM CHLORIDE 0.9 % IV SOLN
Freq: Once | INTRAVENOUS | Status: AC
  Administered 2012-07-28: 22:00:00 via INTRAVENOUS

## 2012-07-28 MED ORDER — ONDANSETRON HCL 4 MG/2ML IJ SOLN
4.0000 mg | Freq: Once | INTRAMUSCULAR | Status: AC
  Administered 2012-07-28: 4 mg via INTRAVENOUS
  Filled 2012-07-28 (×2): qty 2

## 2012-07-28 NOTE — ED Provider Notes (Signed)
History     CSN: [id number]  Arrival date & time 07/28/12  2054   First MD Initiated Contact with Patient 07/28/12 2127      Chief Complaint  Patient presents with  . Nausea  . Emesis  . Back Pain    (Consider location/radiation/quality/duration/timing/severity/associated sxs/prior treatment) The history is provided by the patient and medical records. No language interpreter was used.    [name] is a 47 y.o. female  with a hx of kidney stones, bipolar disorder, anxiety, substance abuse (fentanyl and percocet) presents to the Emergency Department complaining of gradual, persistent, progressively worsening 24hrs of N/V without abdominal pain.  Pt with associated back pain, located on the right low back, rated at a 7/10 and nonradiating.  Pt with chronic foot pain for which she takes oxycodone round the clock rx by Dr Philips at [facility].  Pt's last kidney stone was several years ago.  Pt states she usually has a kidney stone or bladder infection when these symptoms start.  Pt denies fever, chills, chest pain, SOB, abdominal pain, dysuria, hematuria, weakness, dizziness, syncope.  Heating pad makes the pain better and standing makes the pain worse.    Past Medical History  Diagnosis Date  . Dysrhythmia     Left sided heart diease  . Mental disorder   . Bipolar affective disorder, mixed, in partial or remission   . Headache   . Anxiety   . Renal disorder   . Kidney stones   . Depression   . 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 History of gallstones     Past Surgical History  Procedure Laterality Date  . Vaginal hysterectomy    . Cholecystectomy    . Total abdominal hysterectomy w/ bilateral salpingoophorectomy    . Foot surgery      right x 2  . Colonoscopy      2001, 2008.  Father has colon cancer  . Bunionectomy      11/14/2011    Family History  Problem Relation Age of Onset  . Colon cancer Father 30  . Stomach cancer  Neg Hx   . Rectal cancer Neg Hx   . Esophageal cancer Neg Hx   . Breast cancer Mother   . Breast cancer Father   . Uterine cancer Mother     History  Substance Use Topics  . Smoking status: Never Smoker   . Smokeless tobacco: Never Used  . Alcohol Use: No    OB History   Grav Para Term Preterm Abortions TAB SAB Ect Mult Living                  Review of Systems  Constitutional: Negative for fever, diaphoresis, appetite change and fatigue.  Respiratory: Negative for shortness of breath.   Cardiovascular: Negative for chest pain.  Gastrointestinal: Positive for nausea and vomiting. Negative for abdominal pain, diarrhea, constipation and blood in stool.  Genitourinary: Negative for dysuria, urgency, frequency, hematuria, flank pain and difficulty urinating.  Musculoskeletal: Positive for back pain.  Skin: Negative for rash.  Neurological: Negative for headaches.  All other systems reviewed and are negative.    Allergies  Review of patient's allergies indicates no known allergies.  Home Medications   Current Outpatient Rx  Name  Route  Sig  Dispense  Refill  . hydrOXYzine (VISTARIL) 25 MG capsule   Oral   Take 25 mg by mouth at bedtime.         Marland Kitchen  lithium carbonate (LITHOBID) 300 MG CR tablet   Oral   Take 300 mg by mouth daily.         Marland Kitchen oxyCODONE (OXY IR/ROXICODONE) 5 MG immediate release tablet   Oral   Take 5 mg by mouth every 4 (four) hours as needed for pain.          . pregabalin (LYRICA) 75 MG capsule   Oral   Take 75 mg by mouth daily.         . QUEtiapine (SEROQUEL) 400 MG tablet   Oral   Take 800 mg by mouth at bedtime.          . traZODone (DESYREL) 100 MG tablet   Oral   Take 400 mg by mouth at bedtime.         Marland Kitchen zolpidem (AMBIEN) 10 MG tablet   Oral   Take 10 mg by mouth at bedtime. For sleep         . promethazine (PHENERGAN) 12.5 MG tablet   Oral   Take 1 tablet (12.5 mg total) by mouth every 6 (six) hours as needed for  nausea.   10 tablet   0     BP 123/84  Pulse 84  Temp(Src) 97.6 F (36.4 C) (Oral)  Resp 20  Wt 180 lb (81.647 kg)  BMI 29.07 kg/m2  SpO2 100%  Physical Exam  Nursing note and vitals reviewed. Constitutional: She appears well-developed and well-nourished. No distress.  HENT:  Head: Normocephalic and atraumatic.  Mouth/Throat: Oropharynx is clear and moist. No oropharyngeal exudate.  Eyes: Conjunctivae are normal.  Neck: Normal range of motion. Neck supple.  Full ROM without pain  Cardiovascular: Normal rate, regular rhythm, normal heart sounds and intact distal pulses.   Pulmonary/Chest: Effort normal and breath sounds normal. No respiratory distress. She has no wheezes.  Abdominal: Soft. Bowel sounds are normal. She exhibits no distension and no mass. There is no tenderness ( no abdominal tenderness). There is no rebound, no guarding and no CVA tenderness (no CVA tenderness).  Musculoskeletal: Normal range of motion. She exhibits no edema.  Full range of motion of the T-spine and L-spine No tenderness to palpation of the spinous processes of the T-spine or L-spine Mild tenderness to palpation of the paraspinous muscles of the L-spine  Lymphadenopathy:    She has no cervical adenopathy.  Neurological: She is alert. She has normal reflexes.  Speech is clear and goal oriented, follows commands Normal strength in upper and lower extremities bilaterally including dorsiflexion and plantar flexion, strong and equal grip strength Sensation normal to light and sharp touch Moves extremities without ataxia, coordination intact Normal gait Normal balance   Skin: Skin is warm and dry. No rash noted. She is not diaphoretic. No erythema.  Psychiatric: She has a normal mood and affect.    ED Course  Procedures (including critical care time)  Labs Reviewed  CBC WITH DIFFERENTIAL - Abnormal; Notable for the following:    HCT 35.4 (*)    All other components within normal limits   COMPREHENSIVE METABOLIC PANEL - Abnormal; Notable for the following:    Glucose, Bld 115 (*)    GFR calc non Af Amer 82 (*)    All other components within normal limits  URINALYSIS, ROUTINE W REFLEX MICROSCOPIC - Abnormal; Notable for the following:    Specific Gravity, Urine 1.031 (*)    All other components within normal limits  LIPASE, BLOOD   Ct Abdomen Pelvis Wo Contrast  07/28/2012   *RADIOLOGY REPORT*  Clinical Data: Back pain, nausea, history of kidney stones.  CT ABDOMEN AND PELVIS WITHOUT CONTRAST  Technique:  Multidetector CT imaging of the abdomen and pelvis was performed following the standard protocol without intravenous contrast.  Comparison: 05/03/2012 radiograph, 08/17/2011 CT  Findings: Lung bases clear.  Heart size within normal limits.  Organ abnormality/lesion detection is limited in the absence of intravenous contrast. Within this limitation, unremarkable liver, spleen, pancreas, adrenal glands.  Status post cholecystectomy.  Mild biliary ductal prominence is similar to prior.  Symmetric renal size no hydronephrosis or hydroureter.  No urinary tract calculi identified.  No CT evidence for colitis.  Moderate stool burden.  Appendix within normal limits.  Small bowel loops are normal course and caliber.  No free intraperitoneal air or fluid.  No lymphadenopathy.  Normal caliber aorta and branch vessels.  Decompressed bladder.  Absent uterus.  No adnexal mass.  No acute osseous finding. L5 S1 disc bulge with moderate to severe central canal narrowing.  L4-5 broad-based disc bulge and ligamentum flavum hypertrophy results in moderate to severe central canal narrowing.  IMPRESSION: No hydronephrosis or urinary tract calculi identified.  See above.   Original Report Authenticated By: Jearld Lesch, M.D.     1. Nausea and vomiting in adult       MDM  Merdis Delay presents complaining of nausea vomiting and states this is the same as when she has urinary tract infections and  kidney stones. CBC unremarkable, CMP unremarkable, lipase within normal limits, urinalysis without evidence of urinary tract infection or kidney stone as there is no bacteria or hemoglobin.  CT scan without evidence of hydronephrosis or nephrolithiasis. I personally reviewed the imaging tests through PACS system.  I reviewed available ER/hospitalization records through the EMR.    Patient symptoms treated in the emergency department, she is without focal abdominal pain, fluid bolus given. Patient is nontoxic, nonseptic appearing, in no apparent distress.  Patient's pain and other symptoms adequately managed in emergency department.  Labs, imaging and vitals reviewed.  Patient does not meet the SIRS or Sepsis criteria.  On repeat exam patient does not have a surgical abdomin and there are nor peritoneal signs.  No indication of appendicitis, bowel obstruction, bowel perforation, cholecystitis, diverticulitis, PID or ectopic pregnancy.  Patient discharged home with symptomatic treatment without narcotics and given strict instructions for follow-up with their primary care physician.  I have also discussed reasons to return immediately to the ER.  Patient expresses understanding and agrees with plan.         Dahlia Client Kallista Pae, PA-C 07/28/12 2332

## 2012-07-28 NOTE — ED Provider Notes (Signed)
  Medical screening examination/treatment/procedure(s) were performed by non-physician practitioner and as supervising physician I was immediately available for consultation/collaboration.    Gerhard Munch, MD 07/28/12 2352

## 2012-07-28 NOTE — ED Notes (Signed)
Pt states past 2 days has been having nausea and vomiting, states usually has a kidney infection when has these symptoms, complaining of lower back pain and L hand pain which is chronic but unable to keep down pain medications.

## 2012-08-21 ENCOUNTER — Emergency Department (HOSPITAL_COMMUNITY): Payer: Medicare Other

## 2012-08-21 ENCOUNTER — Encounter (HOSPITAL_COMMUNITY): Payer: Self-pay | Admitting: *Deleted

## 2012-08-21 ENCOUNTER — Emergency Department (HOSPITAL_COMMUNITY)
Admission: EM | Admit: 2012-08-21 | Discharge: 2012-08-21 | Disposition: A | Discharge status: Home / Self Care | Payer: Medicare Other | Attending: Emergency Medicine | Admitting: Emergency Medicine

## 2012-08-21 DIAGNOSIS — F489 Nonpsychotic mental disorder, unspecified: Secondary | ICD-10-CM | POA: Insufficient documentation

## 2012-08-21 DIAGNOSIS — T8140XA Infection following a procedure, unspecified, initial encounter: Secondary | ICD-10-CM | POA: Insufficient documentation

## 2012-08-21 DIAGNOSIS — N289 Disorder of kidney and ureter, unspecified: Secondary | ICD-10-CM | POA: Insufficient documentation

## 2012-08-21 DIAGNOSIS — L02619 Cutaneous abscess of unspecified foot: Secondary | ICD-10-CM | POA: Insufficient documentation

## 2012-08-21 DIAGNOSIS — Z79899 Other long term (current) drug therapy: Secondary | ICD-10-CM | POA: Insufficient documentation

## 2012-08-21 DIAGNOSIS — R269 Unspecified abnormalities of gait and mobility: Secondary | ICD-10-CM | POA: Insufficient documentation

## 2012-08-21 DIAGNOSIS — Y838 Other surgical procedures as the cause of abnormal reaction of the patient, or of later complication, without mention of misadventure at the time of the procedure: Secondary | ICD-10-CM | POA: Insufficient documentation

## 2012-08-21 DIAGNOSIS — Z87442 Personal history of urinary calculi: Secondary | ICD-10-CM | POA: Insufficient documentation

## 2012-08-21 DIAGNOSIS — L03039 Cellulitis of unspecified toe: Secondary | ICD-10-CM | POA: Insufficient documentation

## 2012-08-21 DIAGNOSIS — F3177 Bipolar disorder, in partial remission, most recent episode mixed: Secondary | ICD-10-CM | POA: Insufficient documentation

## 2012-08-21 DIAGNOSIS — L0390 Cellulitis, unspecified: Secondary | ICD-10-CM

## 2012-08-21 DIAGNOSIS — Z8679 Personal history of other diseases of the circulatory system: Secondary | ICD-10-CM | POA: Insufficient documentation

## 2012-08-21 DIAGNOSIS — Z8719 Personal history of other diseases of the digestive system: Secondary | ICD-10-CM | POA: Insufficient documentation

## 2012-08-21 DIAGNOSIS — F411 Generalized anxiety disorder: Secondary | ICD-10-CM | POA: Insufficient documentation

## 2012-08-21 MED ORDER — ONDANSETRON 4 MG PO TBDP
4.0000 mg | ORAL_TABLET | Freq: Once | ORAL | Status: AC
  Administered 2012-08-21: 4 mg via ORAL
  Filled 2012-08-21: qty 1

## 2012-08-21 MED ORDER — HYDROMORPHONE HCL PF 2 MG/ML IJ SOLN
2.0000 mg | Freq: Once | INTRAMUSCULAR | Status: AC
  Administered 2012-08-21: 2 mg via INTRAMUSCULAR
  Filled 2012-08-21: qty 1

## 2012-08-21 MED ORDER — AMOXICILLIN-POT CLAVULANATE 875-125 MG PO TABS: 1.0000 | ORAL_TABLET | Freq: Two times a day (BID) | ORAL | Status: DC

## 2012-08-21 NOTE — ED Notes (Signed)
Pt in WC to room T-9. Pt states she had part of her toe nail removed from her L great toe on Thurs. Pt states has worsening pain. States she is allotted 5 oxycodone per day per her pain contract and she took the last one at 1600 today and is still having pain. Pt has redness and swelling to L great toe. Pt states her husband drove her here.

## 2012-08-21 NOTE — ED Notes (Signed)
Pt had in-grown toenail surgically removed on this past Thursday. Pt states worsening pain and drainage starting yesterday after surgery. Pt has been taking oxycodone w/o relief.

## 2012-08-21 NOTE — ED Notes (Signed)
Patient transported to X-ray 

## 2012-08-21 NOTE — ED Provider Notes (Signed)
History    This chart was scribed for Arthor Captain PA-C, a non-physician practitioner working with Gilda Crease, * by Lewanda Rife, ED Scribe. This patient was seen in room WTR9/WTR9 and the patient's care was started at 2156.     CSN: [id number]  Arrival date & time 08/21/12  1857   First MD Initiated Contact with Patient 08/21/12 2116      Chief Complaint  Patient presents with  . Post-op Problem    (Consider location/radiation/quality/duration/timing/severity/associated sxs/prior treatment) The history is provided by the patient.   HPI Comments: [name] is a 47 y.o. female who presents to the Emergency Department complaining of constant moderate, non-radiating right toe pain onset today. Reports having surgery on right great toe 2 days ago with Dr. Charlsie Merles at [facility]. Reports associated increased swelling, and purulent drainage. Denies associated fever, and recent injury. Reports pain is aggravated with touch and alleviated by nothing. Denies taking pain medications PTA to relieve pain.        Past Medical History  Diagnosis Date  . Dysrhythmia     Left sided heart diease  . Mental disorder   . Bipolar affective disorder, mixed, in partial or remission   . Headache(784.0)   . Anxiety   . Renal disorder   . Kidney stones   . Depression   . 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 History of gallstones     Past Surgical History  Procedure Laterality Date  . Vaginal hysterectomy    . Cholecystectomy    . Total abdominal hysterectomy w/ bilateral salpingoophorectomy    . Foot surgery      right x 2  . Colonoscopy      2001, 2008.  Father has colon cancer  . Bunionectomy      11/14/2011    Family History  Problem Relation Age of Onset  . Colon cancer Father 85  . Stomach cancer Neg Hx   . Rectal cancer Neg Hx   . Esophageal cancer Neg Hx   . Breast cancer Mother   . Breast cancer Father   . Uterine cancer  Mother     History  Substance Use Topics  . Smoking status: Never Smoker   . Smokeless tobacco: Never Used  . Alcohol Use: No    OB History   Grav Para Term Preterm Abortions TAB SAB Ect Mult Living                  Review of Systems  Constitutional: Negative for fever.  Musculoskeletal: Positive for gait problem. Negative for joint swelling.  Skin: Positive for wound.  Neurological: Negative for numbness.  All other systems reviewed and are negative.   A complete 10 system review of systems was obtained and all systems are negative except as noted in the HPI and PMH.   Allergies  Review of patient's allergies indicates no known allergies.  Home Medications   Current Outpatient Rx  Name  Route  Sig  Dispense  Refill  . hydrOXYzine (VISTARIL) 25 MG capsule   Oral   Take 25 mg by mouth at bedtime.         Marland Kitchen lithium carbonate (LITHOBID) 300 MG CR tablet   Oral   Take 300 mg by mouth every evening.          Marland Kitchen oxyCODONE (OXY IR/ROXICODONE) 5 MG immediate release tablet   Oral   Take 5-10 mg by mouth  every 6 (six) hours as needed for pain.          . pregabalin (LYRICA) 75 MG capsule   Oral   Take 75 mg by mouth every evening.          Marland Kitchen QUEtiapine (SEROQUEL) 400 MG tablet   Oral   Take 800 mg by mouth at bedtime.          . traZODone (DESYREL) 100 MG tablet   Oral   Take 400 mg by mouth at bedtime.         Marland Kitchen zolpidem (AMBIEN) 10 MG tablet   Oral   Take 10 mg by mouth at bedtime as needed for sleep. For sleep           BP 120/68  Pulse 108  Temp(Src) 98.4 F (36.9 C) (Oral)  Resp 17  Ht 5\' 6"  (1.676 m)  Wt 175 lb (79.379 kg)  BMI 28.26 kg/m2  SpO2 100%  Physical Exam  Nursing note and vitals reviewed. Constitutional: She is oriented to person, place, and time. She appears well-developed and well-nourished. No distress.  HENT:  Head: Normocephalic and atraumatic.  Eyes: EOM are normal.  Neck: Neck supple. No tracheal deviation  present.  Cardiovascular: Normal rate and intact distal pulses.   Pulses:      Dorsalis pedis pulses are 2+ on the right side.  Cap refill < 3 seconds   Pulmonary/Chest: Effort normal. No respiratory distress.  Musculoskeletal: Normal range of motion. She exhibits tenderness. She exhibits no edema.  Purulent discharge surrounding the nail bed, stiff joint greater right toe joint, unable to bend right toe, surrounding erythema and warmth with extension on dorsum right foot, swelling of right great toe   Neurological: She is alert and oriented to person, place, and time.  Skin: Skin is warm and dry.  Tinea pedis bilaterally   Psychiatric: She has a normal mood and affect. Her behavior is normal.    ED Course  Procedures (including critical care time) Medications  HYDROmorphone (DILAUDID) injection 2 mg (2 mg Intramuscular Given 08/21/12 2215)  ondansetron (ZOFRAN-ODT) disintegrating tablet 4 mg (4 mg Oral Given 08/21/12 2214)    Labs Reviewed - No data to display Dg Foot Complete Right  08/21/2012   *RADIOLOGY REPORT*  Clinical Data: Status post recent surgery for ingrown toenail. Right great toe pain.  RIGHT FOOT COMPLETE - 3+ VIEW  Comparison: Right foot radiographs performed 02/28/2010  Findings: There is no evidence of fracture or dislocation.  No definite osseous erosion is seen to suggest osteomyelitis.  The right great toe demonstrates mild soft tissue swelling but is otherwise grossly unremarkable.  The joint spaces are preserved.  There is no evidence of talar subluxation; the subtalar joint is unremarkable in appearance. Postoperative change is noted along the first metatarsal, with screws fusing the medial cuneiform and first metatarsal.  An os peroneum is seen.  IMPRESSION:  1.  No evidence of fracture or dislocation. 2.  No definite osseous erosion seen to suggest osteomyelitis. 3.  Soft tissue swelling noted about the right great toe. 4.  Os peroneum seen.   Original Report  Authenticated By: Tonia Ghent, M.D.     1. Cellulitis       MDM  10:24 AM pATIENT WITH CELLULITIS OF THE TOE. NO EVIDENCE OF OSTEOMYELITIS OF THE TOE. =WILL D/C WITH BACTRIM / PATIEN THAS PAIN MEDS AT HOME.ENCOURAGE WARM SOAKS.  The patient appears reasonably screened and/or stabilized for discharge and I doubt  any other medical condition or other [facility] requiring further screening, evaluation, or treatment in the ED at this time prior to discharge.    I personally performed the services described in this documentation, which was scribed in my presence. The recorded information has been reviewed and is accurate.    Arthor Captain, PA-C 08/25/12 1027

## 2012-08-21 NOTE — ED Notes (Signed)
Wound on L great toe dressed with Telfa gauze, gauze and coban wrap. Pt instructed on further wound care.

## 2012-08-22 ENCOUNTER — Encounter (HOSPITAL_BASED_OUTPATIENT_CLINIC_OR_DEPARTMENT_OTHER): Payer: Self-pay | Admitting: *Deleted

## 2012-08-22 ENCOUNTER — Emergency Department (HOSPITAL_BASED_OUTPATIENT_CLINIC_OR_DEPARTMENT_OTHER)
Admission: EM | Admit: 2012-08-22 | Discharge: 2012-08-22 | Disposition: A | Discharge status: Home / Self Care | Payer: Medicare Other | Source: Home / Self Care | Attending: Emergency Medicine | Admitting: Emergency Medicine

## 2012-08-22 DIAGNOSIS — F3289 Other specified depressive episodes: Secondary | ICD-10-CM | POA: Insufficient documentation

## 2012-08-22 DIAGNOSIS — F3177 Bipolar disorder, in partial remission, most recent episode mixed: Secondary | ICD-10-CM | POA: Insufficient documentation

## 2012-08-22 DIAGNOSIS — Z8719 Personal history of other diseases of the digestive system: Secondary | ICD-10-CM | POA: Insufficient documentation

## 2012-08-22 DIAGNOSIS — F489 Nonpsychotic mental disorder, unspecified: Secondary | ICD-10-CM | POA: Insufficient documentation

## 2012-08-22 DIAGNOSIS — F329 Major depressive disorder, single episode, unspecified: Secondary | ICD-10-CM | POA: Insufficient documentation

## 2012-08-22 DIAGNOSIS — Z87442 Personal history of urinary calculi: Secondary | ICD-10-CM | POA: Insufficient documentation

## 2012-08-22 DIAGNOSIS — Z79899 Other long term (current) drug therapy: Secondary | ICD-10-CM | POA: Insufficient documentation

## 2012-08-22 DIAGNOSIS — G43909 Migraine, unspecified, not intractable, without status migrainosus: Secondary | ICD-10-CM | POA: Insufficient documentation

## 2012-08-22 DIAGNOSIS — F411 Generalized anxiety disorder: Secondary | ICD-10-CM | POA: Insufficient documentation

## 2012-08-22 DIAGNOSIS — Z8679 Personal history of other diseases of the circulatory system: Secondary | ICD-10-CM | POA: Insufficient documentation

## 2012-08-22 DIAGNOSIS — Z87448 Personal history of other diseases of urinary system: Secondary | ICD-10-CM | POA: Insufficient documentation

## 2012-08-22 LAB — URINALYSIS, ROUTINE W REFLEX MICROSCOPIC
Bilirubin Urine: NEGATIVE
Glucose, UA: NEGATIVE mg/dL
Hgb urine dipstick: NEGATIVE
Ketones, ur: NEGATIVE mg/dL
pH: 6.5 (ref 5.0–8.0)

## 2012-08-22 LAB — URINE MICROSCOPIC-ADD ON

## 2012-08-22 LAB — CBC WITH DIFFERENTIAL/PLATELET
Basophils Relative: 0 % (ref 0–1)
Eosinophils Absolute: 0 10*3/uL (ref 0.0–0.7)
HCT: 35.7 % — ABNORMAL LOW (ref 36.0–46.0)
Hemoglobin: 12.3 g/dL (ref 12.0–15.0)
MCH: 29.9 pg (ref 26.0–34.0)
MCHC: 34.5 g/dL (ref 30.0–36.0)
Monocytes Absolute: 0.5 10*3/uL (ref 0.1–1.0)
Monocytes Relative: 7 % (ref 3–12)
Neutro Abs: 4.7 10*3/uL (ref 1.7–7.7)

## 2012-08-22 LAB — BASIC METABOLIC PANEL
BUN: 8 mg/dL (ref 6–23)
Chloride: 103 mEq/L (ref 96–112)
Creatinine, Ser: 1.1 mg/dL (ref 0.50–1.10)
GFR calc Af Amer: 69 mL/min — ABNORMAL LOW (ref >90)
Glucose, Bld: 106 mg/dL — ABNORMAL HIGH (ref 70–99)

## 2012-08-22 MED ORDER — HYDROMORPHONE HCL PF 1 MG/ML IJ SOLN: 1.0000 mg | Freq: Once | INTRAMUSCULAR | Status: AC

## 2012-08-22 MED ORDER — HYDROMORPHONE HCL PF 1 MG/ML IJ SOLN
INTRAMUSCULAR | Status: AC
  Administered 2012-08-22: 1 mg via INTRAVENOUS
  Filled 2012-08-22: qty 1

## 2012-08-22 MED ORDER — SULFAMETHOXAZOLE-TRIMETHOPRIM 800-160 MG PO TABS: 1.0000 | ORAL_TABLET | Freq: Two times a day (BID) | ORAL | Status: DC

## 2012-08-22 MED ORDER — HYDROMORPHONE HCL PF 1 MG/ML IJ SOLN
1.0000 mg | Freq: Once | INTRAMUSCULAR | Status: AC
  Administered 2012-08-22: 1 mg via INTRAVENOUS
  Filled 2012-08-22: qty 1

## 2012-08-22 MED ORDER — CEFTRIAXONE SODIUM 1 G IJ SOLR
1.0000 g | INTRAMUSCULAR | Status: DC
  Administered 2012-08-22: 1 g via INTRAVENOUS
  Filled 2012-08-22: qty 10

## 2012-08-22 MED ORDER — PROMETHAZINE HCL 25 MG/ML IJ SOLN
25.0000 mg | Freq: Once | INTRAMUSCULAR | Status: AC
  Administered 2012-08-22: 25 mg via INTRAVENOUS
  Filled 2012-08-22: qty 1

## 2012-08-22 MED ORDER — SODIUM CHLORIDE 0.9 % IV SOLN
Freq: Once | INTRAVENOUS | Status: AC
  Administered 2012-08-22: 14:00:00 via INTRAVENOUS

## 2012-08-22 MED ORDER — HYDROMORPHONE HCL PF 1 MG/ML IJ SOLN
1.0000 mg | Freq: Once | INTRAMUSCULAR | Status: AC
  Administered 2012-08-22: 1 mg via INTRAVENOUS

## 2012-08-22 MED ORDER — HYDROMORPHONE HCL PF 1 MG/ML IJ SOLN
1.0000 mg | Freq: Once | INTRAMUSCULAR | Status: AC
  Filled 2012-08-22: qty 1

## 2012-08-22 NOTE — ED Provider Notes (Signed)
Medical screening examination/treatment/procedure(s) were performed by non-physician practitioner and as supervising physician I was immediately available for consultation/collaboration.  Christopher J. Pollina, MD 08/22/12 2251 

## 2012-08-22 NOTE — ED Provider Notes (Signed)
History     CSN: [id number]  Arrival date & time 08/22/12  1259   First MD Initiated Contact with Patient 08/22/12 1316      Chief Complaint  Patient presents with  . Migraine    (Consider location/radiation/quality/duration/timing/severity/associated sxs/prior treatment) Patient is a 47 y.o. female presenting with toe pain. The history is provided by the patient. No language interpreter was used.  Toe Pain This is a new problem. The current episode started today. The problem occurs constantly. The problem has been gradually worsening. Associated symptoms include joint swelling.  Pt had surgery done by Dr. Charlsie Merles for an ingrown toenail.   Pt seen at [facility] yesterday for infection.  Pt reports increased redness and swelling up to mid foot today  Past Medical History  Diagnosis Date  . Dysrhythmia     Left sided heart diease  . Mental disorder   . Bipolar affective disorder, mixed, in partial or remission   . Headache(784.0)   . Anxiety   . Renal disorder   . Kidney stones   . Depression   . 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 History of gallstones     Past Surgical History  Procedure Laterality Date  . Vaginal hysterectomy    . Cholecystectomy    . Total abdominal hysterectomy w/ bilateral salpingoophorectomy    . Foot surgery      right x 2  . Colonoscopy      2001, 2008.  Father has colon cancer  . Bunionectomy      11/14/2011    Family History  Problem Relation Age of Onset  . Colon cancer Father 75  . Stomach cancer Neg Hx   . Rectal cancer Neg Hx   . Esophageal cancer Neg Hx   . Breast cancer Mother   . Breast cancer Father   . Uterine cancer Mother     History  Substance Use Topics  . Smoking status: Never Smoker   . Smokeless tobacco: Never Used  . Alcohol Use: No    OB History   Grav Para Term Preterm Abortions TAB SAB Ect Mult Living                  Review of Systems  Musculoskeletal: Positive for joint swelling.   Skin: Positive for wound.  All other systems reviewed and are negative.    Allergies  Review of patient's allergies indicates no known allergies.  Home Medications   Current Outpatient Rx  Name  Route  Sig  Dispense  Refill  . amoxicillin-clavulanate (AUGMENTIN) 875-125 MG per tablet   Oral   Take 1 tablet by mouth every 12 (twelve) hours.   14 tablet   0   . hydrOXYzine (VISTARIL) 25 MG capsule   Oral   Take 25 mg by mouth at bedtime.         Marland Kitchen lithium carbonate (LITHOBID) 300 MG CR tablet   Oral   Take 300 mg by mouth every evening.          Marland Kitchen oxyCODONE (OXY IR/ROXICODONE) 5 MG immediate release tablet   Oral   Take 5-10 mg by mouth every 6 (six) hours as needed for pain.          . pregabalin (LYRICA) 75 MG capsule   Oral   Take 75 mg by mouth every evening.          Marland Kitchen QUEtiapine (SEROQUEL) 400 MG tablet   Oral  Take 800 mg by mouth at bedtime.          . traZODone (DESYREL) 100 MG tablet   Oral   Take 400 mg by mouth at bedtime.         Marland Kitchen zolpidem (AMBIEN) 10 MG tablet   Oral   Take 10 mg by mouth at bedtime as needed for sleep. For sleep           BP 113/72  Pulse 116  Temp(Src) 98.9 F (37.2 C) (Oral)  Resp 20  Ht 5\' 6"  (1.676 m)  Wt 180 lb (81.647 kg)  BMI 29.07 kg/m2  SpO2 95%  Physical Exam  Nursing note and vitals reviewed. Constitutional: She is oriented to person, place, and time. She appears well-developed and well-nourished.  Musculoskeletal: She exhibits tenderness.  Swollen red 1st toe  nv and ns intact  Neurological: She is alert and oriented to person, place, and time. She has normal reflexes.  Skin: There is erythema.  Psychiatric: She has a normal mood and affect.    ED Course  Procedures (including critical care time)  Labs Reviewed  URINALYSIS, ROUTINE W REFLEX MICROSCOPIC - Abnormal; Notable for the following:    APPearance CLOUDY (*)    Leukocytes, UA SMALL (*)    All other components within normal  limits  URINE MICROSCOPIC-ADD ON - Abnormal; Notable for the following:    Squamous Epithelial / LPF FEW (*)    Bacteria, UA MANY (*)    Casts HYALINE CASTS (*)    All other components within normal limits  URINE CULTURE  CBC WITH DIFFERENTIAL  BASIC METABOLIC PANEL   Dg Foot Complete Right  08/21/2012   *RADIOLOGY REPORT*  Clinical Data: Status post recent surgery for ingrown toenail. Right great toe pain.  RIGHT FOOT COMPLETE - 3+ VIEW  Comparison: Right foot radiographs performed 02/28/2010  Findings: There is no evidence of fracture or dislocation.  No definite osseous erosion is seen to suggest osteomyelitis.  The right great toe demonstrates mild soft tissue swelling but is otherwise grossly unremarkable.  The joint spaces are preserved.  There is no evidence of talar subluxation; the subtalar joint is unremarkable in appearance. Postoperative change is noted along the first metatarsal, with screws fusing the medial cuneiform and first metatarsal.  An os peroneum is seen.  IMPRESSION:  1.  No evidence of fracture or dislocation. 2.  No definite osseous erosion seen to suggest osteomyelitis. 3.  Soft tissue swelling noted about the right great toe. 4.  Os peroneum seen.   Original Report Authenticated By: Tonia Ghent, M.D.     1. Headache       MDM  Pt given Rocehin and bactrim.   Pt  Advised to call Dr. Charlsie Merles tomorrow to be seen for recheck.   The redness in pt's foot decreased while here        Elson Areas, PA-C 08/22/12 1907

## 2012-08-22 NOTE — ED Notes (Signed)
Pt sattes she has a hx of migraines and has had one since yest +N/V

## 2012-08-24 ENCOUNTER — Inpatient Hospital Stay (HOSPITAL_COMMUNITY)
Admission: EM | Admit: 2012-08-24 | Discharge: 2012-08-26 | DRG: 603 | Disposition: A | Discharge status: Home / Self Care | Payer: Medicare Other | Attending: Internal Medicine | Admitting: Internal Medicine

## 2012-08-24 ENCOUNTER — Emergency Department (HOSPITAL_COMMUNITY): Payer: Medicare Other

## 2012-08-24 ENCOUNTER — Encounter (HOSPITAL_COMMUNITY): Payer: Self-pay | Admitting: *Deleted

## 2012-08-24 DIAGNOSIS — L03039 Cellulitis of unspecified toe: Principal | ICD-10-CM | POA: Diagnosis present

## 2012-08-24 DIAGNOSIS — L600 Ingrowing nail: Secondary | ICD-10-CM | POA: Diagnosis present

## 2012-08-24 DIAGNOSIS — N179 Acute kidney failure, unspecified: Secondary | ICD-10-CM | POA: Diagnosis present

## 2012-08-24 DIAGNOSIS — F250 Schizoaffective disorder, bipolar type: Secondary | ICD-10-CM

## 2012-08-24 DIAGNOSIS — F319 Bipolar disorder, unspecified: Secondary | ICD-10-CM | POA: Diagnosis present

## 2012-08-24 DIAGNOSIS — F259 Schizoaffective disorder, unspecified: Secondary | ICD-10-CM | POA: Diagnosis present

## 2012-08-24 DIAGNOSIS — Z79899 Other long term (current) drug therapy: Secondary | ICD-10-CM

## 2012-08-24 DIAGNOSIS — L089 Local infection of the skin and subcutaneous tissue, unspecified: Secondary | ICD-10-CM | POA: Diagnosis present

## 2012-08-24 DIAGNOSIS — E860 Dehydration: Secondary | ICD-10-CM | POA: Diagnosis present

## 2012-08-24 DIAGNOSIS — G894 Chronic pain syndrome: Secondary | ICD-10-CM

## 2012-08-24 LAB — CBC WITH DIFFERENTIAL/PLATELET
Basophils Absolute: 0 10*3/uL (ref 0.0–0.1)
Basophils Relative: 0 % (ref 0–1)
Eosinophils Absolute: 0 10*3/uL (ref 0.0–0.7)
Eosinophils Relative: 0 % (ref 0–5)
HCT: 35.4 % — ABNORMAL LOW (ref 36.0–46.0)
MCH: 29.4 pg (ref 26.0–34.0)
MCHC: 34.7 g/dL (ref 30.0–36.0)
MCV: 84.5 fL (ref 78.0–100.0)
Monocytes Absolute: 0.4 10*3/uL (ref 0.1–1.0)
Monocytes Relative: 6 % (ref 3–12)
Neutro Abs: 4.2 10*3/uL (ref 1.7–7.7)
RDW: 12.7 % (ref 11.5–15.5)

## 2012-08-24 LAB — POCT I-STAT, CHEM 8
Calcium, Ion: 1.19 mmol/L (ref 1.12–1.23)
Chloride: 104 mEq/L (ref 96–112)
Glucose, Bld: 89 mg/dL (ref 70–99)
HCT: 36 % (ref 36.0–46.0)
Hemoglobin: 12.2 g/dL (ref 12.0–15.0)

## 2012-08-24 LAB — URINE CULTURE

## 2012-08-24 MED ORDER — ONDANSETRON HCL 4 MG/2ML IJ SOLN
4.0000 mg | Freq: Once | INTRAMUSCULAR | Status: DC
  Filled 2012-08-24: qty 2

## 2012-08-24 MED ORDER — VANCOMYCIN HCL IN DEXTROSE 1-5 GM/200ML-% IV SOLN
1000.0000 mg | Freq: Once | INTRAVENOUS | Status: AC
  Administered 2012-08-25: 1000 mg via INTRAVENOUS
  Filled 2012-08-24: qty 200

## 2012-08-24 MED ORDER — SODIUM CHLORIDE 0.9 % IV SOLN
Freq: Once | INTRAVENOUS | Status: AC
  Administered 2012-08-25: 04:00:00 via INTRAVENOUS

## 2012-08-24 MED ORDER — HYDROMORPHONE HCL PF 1 MG/ML IJ SOLN
0.5000 mg | Freq: Once | INTRAMUSCULAR | Status: DC
  Filled 2012-08-24: qty 1

## 2012-08-24 NOTE — ED Notes (Signed)
Pt had ingrown toenail clipped 6 days ago- toe now red and swollen- open wound noted to end on toe- pt reports toe is numb

## 2012-08-24 NOTE — ED Notes (Signed)
IV team paged for IV start, attempted already by two RNs

## 2012-08-24 NOTE — ED Provider Notes (Addendum)
History     CSN: [id number]  Arrival date & time 08/24/12  2111   First MD Initiated Contact with Patient 08/24/12 2231      Chief Complaint  Patient presents with  . Wound Check    (Consider location/radiation/quality/duration/timing/severity/associated sxs/prior treatment) HPI Comments: This is the third.ED visit in 4, days for [name] complaining of left great, toe pain.  Approximately 6 days ago, she saw her podiatrist, who trimmed her nail planning on a nail removal for an ingrown nail in several weeks.  Since that time,she has had increased pain, erythema, now: has discharge bilaterally, increased redness, and pain into the dorsal aspect of the joint of the left great, she's been on 2 different antibiotics, including Septra, and Augmentin.  She was given an injection of Rocephin, on 6/8.  She has been taking 5 mg oxycodone 5 times a day without relief of her pain.   Patient is a 47 y.o. female presenting with wound check. The history is provided by the patient.  Wound Check This is a recurrent problem. The current episode started in the past 7 days. The problem occurs constantly. The problem has been gradually worsening. Associated symptoms include joint swelling. Pertinent negatives include no chills, fever, headaches, nausea, vomiting or weakness. The symptoms are aggravated by walking. She has tried oral narcotics for the symptoms. The treatment provided no relief.    Past Medical History  Diagnosis Date  . Dysrhythmia     Left sided heart diease  . Mental disorder   . Bipolar affective disorder, mixed, in partial or remission   . Headache(784.0)   . Anxiety   . Renal disorder   . Kidney stones   . Depression   . 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 History of gallstones     Past Surgical History  Procedure Laterality Date  . Vaginal hysterectomy    . Cholecystectomy    . Total abdominal hysterectomy w/ bilateral salpingoophorectomy    .  Foot surgery      right x 2  . Colonoscopy      2001, 2008.  Father has colon cancer  . Bunionectomy      11/14/2011    Family History  Problem Relation Age of Onset  . Colon cancer Father 82  . Stomach cancer Neg Hx   . Rectal cancer Neg Hx   . Esophageal cancer Neg Hx   . Breast cancer Mother   . Breast cancer Father   . Uterine cancer Mother     History  Substance Use Topics  . Smoking status: Never Smoker   . Smokeless tobacco: Never Used  . Alcohol Use: No    OB History   Grav Para Term Preterm Abortions TAB SAB Ect Mult Living                  Review of Systems  Constitutional: Negative for fever and chills.  Cardiovascular: Negative for leg swelling.  Gastrointestinal: Negative for nausea and vomiting.  Musculoskeletal: Positive for joint swelling and gait problem.  Skin: Positive for wound.  Neurological: Negative for dizziness, weakness and headaches.  All other systems reviewed and are negative.    Allergies  Review of patient's allergies indicates no known allergies.  Home Medications   Current Outpatient Rx  Name  Route  Sig  Dispense  Refill  . hydrOXYzine (VISTARIL) 25 MG capsule   Oral   Take 25 mg by mouth at  bedtime.         Marland Kitchen lithium carbonate (LITHOBID) 300 MG CR tablet   Oral   Take 300 mg by mouth every evening.          Marland Kitchen oxyCODONE (OXY IR/ROXICODONE) 5 MG immediate release tablet   Oral   Take 5-10 mg by mouth every 6 (six) hours as needed for pain.          . pregabalin (LYRICA) 75 MG capsule   Oral   Take 75 mg by mouth every evening.          Marland Kitchen QUEtiapine (SEROQUEL) 400 MG tablet   Oral   Take 800 mg by mouth at bedtime.          . traZODone (DESYREL) 100 MG tablet   Oral   Take 400 mg by mouth at bedtime.         Marland Kitchen zolpidem (AMBIEN) 10 MG tablet   Oral   Take 10 mg by mouth at bedtime as needed for sleep. For sleep         . doxycycline (VIBRAMYCIN) 50 MG capsule   Oral   Take 2 capsules (100 mg  total) by mouth 2 (two) times daily.   28 capsule   0   . mupirocin cream (BACTROBAN) 2 %   Topical   Apply topically daily. Please apply to left great toe as prescribed  daily   15 g   0   . promethazine (PHENERGAN) 25 MG tablet   Oral   Take 1 tablet (25 mg total) by mouth every 6 (six) hours as needed for nausea.   60 tablet   0     BP 101/65  Pulse 72  Temp(Src) 97.4 F (36.3 C) (Oral)  Resp 16  Ht 5\' 7"  (1.702 m)  Wt 177 lb 11.1 oz (80.6 kg)  BMI 27.82 kg/m2  SpO2 99%  Physical Exam  Nursing note and vitals reviewed. Constitutional: She is oriented to person, place, and time. She appears well-developed and well-nourished.  HENT:  Head: Normocephalic.  Eyes: Pupils are equal, round, and reactive to light.  Neck: Normal range of motion.  Cardiovascular: Normal rate and regular rhythm.   Pulmonary/Chest: Effort normal and breath sounds normal.  Musculoskeletal: She exhibits tenderness. She exhibits no edema.       Feet:  Erythema to the entire left great, toe nail is intact.  Not loose, but she does have bilateral drainage tube with toes dark in color with decreased sensation  Neurological: She is alert and oriented to person, place, and time.  Skin: Skin is warm. There is erythema.    ED Course  Procedures (including critical care time)  Labs Reviewed  CBC WITH DIFFERENTIAL - Abnormal; Notable for the following:    HCT 35.4 (*)    All other components within normal limits  CBC - Abnormal; Notable for the following:    Hemoglobin 11.2 (*)    HCT 33.2 (*)    Platelets 149 (*)    All other components within normal limits  BASIC METABOLIC PANEL - Abnormal; Notable for the following:    Potassium 3.0 (*)    Glucose, Bld 108 (*)    All other components within normal limits  POCT I-STAT, CHEM 8 - Abnormal; Notable for the following:    Creatinine, Ser 1.30 (*)    All other components within normal limits  WOUND CULTURE   No results found.   1. Ingrown  left greater toenail  2. Infection of left foot   3. Bipolar 1 disorder   4. Chronic pain syndrome   5. Schizoaffective disorder, bipolar type   6. Acute renal failure       MDM   Will repeat labs and xray start IV antibiotics  Most likely will admit for outpatient treatment failure        Arman Filter, NP 08/31/12 2021  Arman Filter, NP 09/01/12 2054

## 2012-08-25 ENCOUNTER — Inpatient Hospital Stay (HOSPITAL_COMMUNITY): Payer: Medicare Other

## 2012-08-25 DIAGNOSIS — M79609 Pain in unspecified limb: Secondary | ICD-10-CM

## 2012-08-25 DIAGNOSIS — L089 Local infection of the skin and subcutaneous tissue, unspecified: Secondary | ICD-10-CM | POA: Diagnosis present

## 2012-08-25 DIAGNOSIS — N179 Acute kidney failure, unspecified: Secondary | ICD-10-CM | POA: Diagnosis present

## 2012-08-25 DIAGNOSIS — L600 Ingrowing nail: Secondary | ICD-10-CM

## 2012-08-25 MED ORDER — QUETIAPINE FUMARATE 400 MG PO TABS
800.0000 mg | ORAL_TABLET | Freq: Every day | ORAL | Status: DC
  Administered 2012-08-25: 800 mg via ORAL
  Filled 2012-08-25 (×2): qty 2

## 2012-08-25 MED ORDER — ZOLPIDEM TARTRATE 10 MG PO TABS
10.0000 mg | ORAL_TABLET | Freq: Every evening | ORAL | Status: DC | PRN
  Administered 2012-08-25: 10 mg via ORAL
  Filled 2012-08-25: qty 1

## 2012-08-25 MED ORDER — LITHIUM CARBONATE ER 300 MG PO TBCR
300.0000 mg | EXTENDED_RELEASE_TABLET | Freq: Every evening | ORAL | Status: DC
  Administered 2012-08-25 – 2012-08-26 (×2): 300 mg via ORAL
  Filled 2012-08-25 (×2): qty 1

## 2012-08-25 MED ORDER — TRAZODONE HCL 150 MG PO TABS
400.0000 mg | ORAL_TABLET | Freq: Every day | ORAL | Status: DC
  Administered 2012-08-25: 400 mg via ORAL
  Filled 2012-08-25 (×2): qty 1

## 2012-08-25 MED ORDER — HYDROXYZINE HCL 25 MG PO TABS
25.0000 mg | ORAL_TABLET | Freq: Every day | ORAL | Status: DC
  Administered 2012-08-25: 25 mg via ORAL
  Filled 2012-08-25 (×2): qty 1

## 2012-08-25 MED ORDER — PREGABALIN 75 MG PO CAPS
75.0000 mg | ORAL_CAPSULE | Freq: Every evening | ORAL | Status: DC
  Administered 2012-08-25 – 2012-08-26 (×2): 75 mg via ORAL
  Filled 2012-08-25 (×2): qty 1

## 2012-08-25 MED ORDER — LIDOCAINE HCL 1 % IJ SOLN
INTRAMUSCULAR | Status: AC
  Filled 2012-08-25: qty 20

## 2012-08-25 MED ORDER — VANCOMYCIN HCL IN DEXTROSE 1-5 GM/200ML-% IV SOLN
1000.0000 mg | Freq: Two times a day (BID) | INTRAVENOUS | Status: DC
  Administered 2012-08-25 – 2012-08-26 (×3): 1000 mg via INTRAVENOUS
  Filled 2012-08-25 (×3): qty 200

## 2012-08-25 MED ORDER — HYDROMORPHONE HCL PF 1 MG/ML IJ SOLN
0.5000 mg | Freq: Once | INTRAMUSCULAR | Status: AC
  Administered 2012-08-25: 0.5 mg via INTRAMUSCULAR

## 2012-08-25 MED ORDER — MUPIROCIN CALCIUM 2 % EX CREA
TOPICAL_CREAM | Freq: Two times a day (BID) | CUTANEOUS | Status: DC
  Administered 2012-08-25 – 2012-08-26 (×2): via TOPICAL
  Filled 2012-08-25 (×2): qty 15

## 2012-08-25 MED ORDER — HYDROXYZINE PAMOATE 25 MG PO CAPS
25.0000 mg | ORAL_CAPSULE | Freq: Every day | ORAL | Status: DC
  Filled 2012-08-25: qty 1

## 2012-08-25 MED ORDER — PROMETHAZINE HCL 25 MG PO TABS
25.0000 mg | ORAL_TABLET | Freq: Four times a day (QID) | ORAL | Status: DC | PRN
  Administered 2012-08-25 – 2012-08-26 (×3): 25 mg via ORAL
  Filled 2012-08-25 (×3): qty 1

## 2012-08-25 MED ORDER — HYDROMORPHONE HCL 2 MG PO TABS
2.0000 mg | ORAL_TABLET | ORAL | Status: DC | PRN
  Administered 2012-08-25 – 2012-08-26 (×4): 2 mg via ORAL
  Filled 2012-08-25 (×4): qty 1

## 2012-08-25 MED ORDER — HYDROMORPHONE HCL 4 MG PO TABS
4.0000 mg | ORAL_TABLET | Freq: Once | ORAL | Status: AC
  Administered 2012-08-25: 4 mg via ORAL
  Filled 2012-08-25: qty 1

## 2012-08-25 MED ORDER — HEPARIN SODIUM (PORCINE) 5000 UNIT/ML IJ SOLN
5000.0000 [IU] | Freq: Three times a day (TID) | INTRAMUSCULAR | Status: DC
  Administered 2012-08-25 – 2012-08-26 (×5): 5000 [IU] via SUBCUTANEOUS
  Filled 2012-08-25 (×7): qty 1

## 2012-08-25 NOTE — ED Provider Notes (Addendum)
47 year old female had an ingrown toenail of the left first toe treated 5 days ago and has been noticing progressive pain, redness, swelling in her toe. She has been seen in the ED and treated with antibiotics but continues to get worse. It is painful on the plantar surface and numb on the dorsal surface. On exam, the toe is intensely erythematous and moderately swollen. There is significant swelling in this subungual area. She has clearly failed outpatient management and will need to come in for IV antibiotics and consideration for surgical drainage. Also, she likely will need MRI to evaluate for possible osteomyelitis.  Medical screening examination/treatment/procedure(s) were conducted as a shared visit with non-physician practitioner(s) and myself.  I personally evaluated the patient during the encounter   Dione Booze, MD 08/25/12 0013  Peripheral IVs were unable to be obtained. Therefore, a inserted a peripheral IV with ultrasound guidance.  Angiocath insertion Performed by: WUJWJ,XBJYN  Consent: Verbal consent obtained. Risks and benefits: risks, benefits and alternatives were discussed Time out: Immediately prior to procedure a "time out" was called to verify the correct patient, procedure, equipment, support staff and site/side marked as required.  Preparation: Patient was prepped and draped in the usual sterile fashion.  Vein Location: Right basilic  Ultrasound Guided, image saved  Gauge: 20  Normal blood return and flush without difficulty Patient tolerance: Patient tolerated the procedure well with no immediate complications.     Dione Booze, MD 08/25/12 8295  Dione Booze, MD 08/25/12 [phone]

## 2012-08-25 NOTE — H&P (Signed)
[facility] History and Physical  JACQUALYN SEDGWICK YNW:[id number] DOB: August 23, 1965 DOA: 08/24/2012  Referring physician: ED PCP: Letitia Libra, Ala Dach, MD   Chief Complaint: Link Snuffer  HPI: KENIA TEAGARDEN is a 47 y.o. female with ingrown tonail of L great toe who has had progressive pain, redness and swelling over the past 5 days.  Been treated in the ED with 2 outpatient courses of antibiotics and has failed these as evidenced by spread of the infection to now involving the skin of the entire toe.  Nothing seems to make her symptoms better.  In the ED, simple evaluation of the patient reveals obvious infection of the great toe, vancomycin is started, and hospitalist asked to admit.  Review of Systems: 12 systems reviewed and otherwise negative.  Past Medical History  Diagnosis Date  . Dysrhythmia     Left sided heart diease  . Mental disorder   . Bipolar affective disorder, mixed, in partial or remission   . Headache(784.0)   . Anxiety   . Renal disorder   . Kidney stones   . Depression   . 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 History of gallstones    Past Surgical History  Procedure Laterality Date  . Vaginal hysterectomy    . Cholecystectomy    . Total abdominal hysterectomy w/ bilateral salpingoophorectomy    . Foot surgery      right x 2  . Colonoscopy      2001, 2008.  Father has colon cancer  . Bunionectomy      11/14/2011   Social History:  reports that she has never smoked. She has never used smokeless tobacco. She reports that she does not drink alcohol or use illicit drugs.   No Known Allergies  Family History  Problem Relation Age of Onset  . Colon cancer Father 45  . Stomach cancer Neg Hx   . Rectal cancer Neg Hx   . Esophageal cancer Neg Hx   . Breast cancer Mother   . Breast cancer Father   . Uterine cancer Mother    Prior to Admission medications   Medication Sig Start Date End Date Taking? Authorizing Provider   hydrOXYzine (VISTARIL) 25 MG capsule Take 25 mg by mouth at bedtime.   Yes Historical Provider, MD  lithium carbonate (LITHOBID) 300 MG CR tablet Take 300 mg by mouth every evening.    Yes Historical Provider, MD  oxyCODONE (OXY IR/ROXICODONE) 5 MG immediate release tablet Take 5-10 mg by mouth every 6 (six) hours as needed for pain.    Yes Historical Provider, MD  pregabalin (LYRICA) 75 MG capsule Take 75 mg by mouth every evening.    Yes Historical Provider, MD  QUEtiapine (SEROQUEL) 400 MG tablet Take 800 mg by mouth at bedtime.    Yes Historical Provider, MD  sulfamethoxazole-trimethoprim (BACTRIM DS) 800-160 MG per tablet Take 1 tablet by mouth 2 (two) times daily.   Yes Historical Provider, MD  traZODone (DESYREL) 100 MG tablet Take 400 mg by mouth at bedtime.   Yes Historical Provider, MD  zolpidem (AMBIEN) 10 MG tablet Take 10 mg by mouth at bedtime as needed for sleep. For sleep   Yes Historical Provider, MD   Physical Exam: Filed Vitals:   08/24/12 2117 08/24/12 2330 08/25/12 0500  BP: 124/82 116/71 110/63  Pulse: 106 89 74  Temp: 97.9 F (36.6 C) 97.9 F (36.6 C) 98.3 F (36.8 C)  TempSrc: Oral Oral  Oral  Resp: 18 18 16   Height: 5\' 7"  (1.702 m)  5\' 7"  (1.702 m)  Weight: 81.647 kg (180 lb)  80.6 kg (177 lb 11.1 oz)  SpO2: 97% 96% 95%    General:  NAD, resting comfortably in bed Eyes: PEERLA EOMI ENT: mucous membranes moist Neck: supple w/o JVD Cardiovascular: RRR w/o MRG Respiratory: CTA B Abdomen: soft, nt, nd, bs+ Skin: there is obvious and clear infection of the great toe of her left foot, there is erythema, edema, and a purulent exudate from the nail bed, the erythema extends up to the base of the toe Musculoskeletal: MAE, full ROM all 4 extremities Psychiatric: normal tone and affect Neurologic: AAOx3, grossly non-focal  Labs on Admission:  Basic Metabolic Panel:  Recent Labs Lab 08/22/12 1348 08/24/12 2327  NA 140 142  K 4.0 3.6  CL 103 104  CO2 29  --    GLUCOSE 106* 89  BUN 8 14  CREATININE 1.10 1.30*  CALCIUM 9.3  --    Liver Function Tests: No results found for this basename: AST, ALT, ALKPHOS, BILITOT, PROT, ALBUMIN,  in the last 168 hours No results found for this basename: LIPASE, AMYLASE,  in the last 168 hours No results found for this basename: AMMONIA,  in the last 168 hours CBC:  Recent Labs Lab 08/22/12 1348 08/24/12 2310 08/24/12 2327  WBC 6.4 6.0  --   NEUTROABS 4.7 4.2  --   HGB 12.3 12.3 12.2  HCT 35.7* 35.4* 36.0  MCV 86.9 84.5  --   PLT 153 176  --    Cardiac Enzymes: No results found for this basename: CKTOTAL, CKMB, CKMBINDEX, TROPONINI,  in the last 168 hours  BNP (last 3 results) No results found for this basename: PROBNP,  in the last 8760 hours CBG: No results found for this basename: GLUCAP,  in the last 168 hours  Radiological Exams on Admission: Dg Toe Great Right  08/24/2012   *RADIOLOGY REPORT*  Clinical Data: Pain, swelling, and wall great toe after podiatrist clips toe nail on 08/19/2012.  RIGHT GREAT TOE  Comparison: Right foot 08/21/2012  Findings: Apparent soft tissue swelling of the right first toe. Underlying bones appear intact.  No evidence of acute fracture or subluxation.  No focal bone destruction or cortical erosion.  No radiopaque soft tissue foreign bodies or gas collections. Postoperative changes in the right first tarsometatarsal region.  IMPRESSION: Soft tissue swelling about the right first toe.  No acute bony abnormalities identified.   Original Report Authenticated By: Burman Nieves, M.D.    EKG: Independently reviewed.  Assessment/Plan Principal Problem:   Infection of left foot Active Problems:   Ingrown left greater toenail   1. Ingrown toenail, left, great toe, infected - Very obvious infection of the toe, failed outpatient antibiotics, admitting and starting patient on vancomycin, needs ortho consult for possible removal of the toenail later today.  Clinically  patient not septic, later may need MRI for eval for possible osteomyelitis as well.  Have communicated with patient that in worst case she may even loose the toe but hopefully not.    Code Status: Full Code (must indicate code status--if unknown or must be presumed, indicate so) Family Communication: No family in room (indicate person spoken with, if applicable, with phone number if by telephone) Disposition Plan: Admit to inpatient (indicate anticipated LOS)  Time spent: 50 min  GARDNER, JARED M. [facility] Pager [phone]  If 7PM-7AM, please contact night-coverage [email or web address] Password [email or web address]  08/25/2012, 5:24 AM

## 2012-08-25 NOTE — ED Notes (Signed)
IV team at bedside 

## 2012-08-25 NOTE — Progress Notes (Signed)
[facility] PROGRESS NOTE  CHARNISE LOVAN WGN:[id number] DOB: [birth year] DOA: 08/24/2012 PCP: Estill Cotta, MD  Brief narrative: 47 y.o. female with past medical history of bipolar disorder who presented to [facility] ED 08/24/2012 with progressively worsening pain, redness and swelling of left toe after having ingrown toenail. Patient has been treated in the ED with 2 outpatient courses of antibiotics but swelling and redness was progressively worse. X ray confirmed soft itssue swelling of left toe but no acute boy destruction. Patient was started on vancomycin for infection of the left foot toe.  Assessment/Plan:  Principal Problem:   Infection of left foot - Continue vancomycin for now - Appreciate orthopedic surgery consult for possible I&D - Will followup on ABI study - Pain management with Dilaudid 2 mg every 4 hours by mouth as needed for moderate pain Active Problems:   Bipolar 1 disorder - Continue lithium 300 mg at bedtime   Schizoaffective disorder - Continue Seroquel 800 mg at bedtime   Acute renal failure - Likely after vancomycin - Continue to monitor renal function  Code Status: full code Family Communication: family not at the bedside Disposition Plan: home when stable  Manson Passey, MD  [facility] Pager [phone]  If 7PM-7AM, please contact night-coverage [email or web address] Password [email or web address] 08/25/2012, 11:42 AM   LOS: 1 day   Consultants:  Orthopedic surgery  Procedures:  ABI to be done today 08/25/2012  Antibiotics:  Vancomycin 08/24/2012 -->  HPI/Subjective: No acute events since admission.  Objective: Filed Vitals:   08/24/12 2117 08/24/12 2330 08/25/12 0500  BP: 124/82 116/71 110/63  Pulse: 106 89 74  Temp: 97.9 F (36.6 C) 97.9 F (36.6 C) 98.3 F (36.8 C)  TempSrc: Oral Oral Oral  Resp: 18 18 16   Height: 5\' 7"  (1.702 m)  5\' 7"  (1.702 m)  Weight: 81.647 kg (180 lb)  80.6 kg (177 lb 11.1 oz)  SpO2: 97% 96% 95%   No intake or output data  in the 24 hours ending 08/25/12 1142  Exam:   General:  Pt is alert, follows commands appropriately, not in acute distress  Cardiovascular: Regular rate and rhythm, S1/S2, no murmurs, no rubs, no gallops  Respiratory: Clear to auscultation bilaterally, no wheezing, no crackles, no rhonchi  Abdomen: Soft, non tender, non distended, bowel sounds present, no guarding  Extremities: No edema, pulses DP and PT palpable bilaterally; left toe redness with warmth and tenderness, some blood oozing  Neuro: Grossly nonfocal  Data Reviewed: Basic Metabolic Panel:  Recent Labs Lab 08/22/12 1348 08/24/12 2327  NA 140 142  K 4.0 3.6  CL 103 104  CO2 29  --   GLUCOSE 106* 89  BUN 8 14  CREATININE 1.10 1.30*  CALCIUM 9.3  --    Liver Function Tests: No results found for this basename: AST, ALT, ALKPHOS, BILITOT, PROT, ALBUMIN,  in the last 168 hours No results found for this basename: LIPASE, AMYLASE,  in the last 168 hours No results found for this basename: AMMONIA,  in the last 168 hours CBC:  Recent Labs Lab 08/22/12 1348 08/24/12 2310 08/24/12 2327  WBC 6.4 6.0  --   NEUTROABS 4.7 4.2  --   HGB 12.3 12.3 12.2  HCT 35.7* 35.4* 36.0  MCV 86.9 84.5  --   PLT 153 176  --    Cardiac Enzymes: No results found for this basename: CKTOTAL, CKMB, CKMBINDEX, TROPONINI,  in the last 168 hours BNP: No components found with this basename: POCBNP,  CBG: No results found for this basename: GLUCAP,  in the last 168 hours  Recent Results (from the past 240 hour(s))  URINE CULTURE     Status: None   Collection Time    08/22/12  1:09 PM      Result Value Range Status   Specimen Description URINE, CLEAN CATCH   Final   Special Requests NONE   Final   Culture  Setup Time 08/23/2012 12:39   Final   Colony Count 50,000 COLONIES/ML   Final   Culture     Final   Value: Multiple bacterial morphotypes present, none predominant. Suggest appropriate recollection if clinically indicated.    Report Status 08/24/2012 FINAL   Final     Studies: Dg Toe Great Right 08/24/2012     IMPRESSION: Soft tissue swelling about the right first toe.  No acute bony abnormalities identified.   Original Report Authenticated By: Burman Nieves, M.D.    Scheduled Meds: . heparin  5,000 Units Subcutaneous Q8H  . hydrOXYzine  25 mg Oral QHS  . lidocaine      . lithium carbonate  300 mg Oral QPM  . pregabalin  75 mg Oral QPM  . QUEtiapine  800 mg Oral QHS  . traZODone  400 mg Oral QHS  . vancomycin  1,000 mg Intravenous Q12H

## 2012-08-25 NOTE — Care Management Note (Signed)
CARE MANAGEMENT NOTE 08/25/2012  Patient:  [name], [name]   Account Number:  [id number]  Date Initiated:  08/25/2012  Documentation initiated by:  Demetrice Combes  Subjective/Objective Assessment:   47 yo female admitted with left great toe infection. PTA pt independent.     Action/Plan:   Home when stable   Anticipated DC Date:     Anticipated DC Plan:  HOME/SELF CARE  In-house referral  NA      DC Planning Services  CM consult      Choice offered to / List presented to:  NA   DME arranged  NA      DME agency  NA     HH arranged  NA      HH agency  NA   Status of service:  In process, will continue to follow Medicare Important Message given?   (If response is "NO", the following Medicare IM given date fields will be blank) Date Medicare IM given:   Date Additional Medicare IM given:    Discharge Disposition:    Per UR Regulation:  Reviewed for med. necessity/level of care/duration of stay  If discussed at Long Length of Stay Meetings, dates discussed:    Comments:  08/25/12 1252 Leonie Green [phone] Chart reviewed for utilization of services. No needs assessed at this time.

## 2012-08-25 NOTE — Procedures (Signed)
Successful placement of single lumen PICC line to (L)brachial vein. Length 46cm Tip at lower SVC/RA No complications Ready for use.  Brayton El PA-C Interventional Radiology 08/25/2012 12:01 PM

## 2012-08-25 NOTE — Progress Notes (Signed)
VASCULAR LAB PRELIMINARY  ARTERIAL  ABI completed:    RIGHT    LEFT    PRESSURE WAVEFORM  PRESSURE WAVEFORM  BRACHIAL 113 Triphasic BRACHIAL Not obtained due to PICC line   DP 114 Triphasic DP 132 Triphasic  PT 130 Triphasic PT 137 Biphasic  GREAT TOE 131  GREAT TOE 92     RIGHT LEFT  ABI 1.15 TBI 1.16 1.21 TBI 0.81   ABIs and Doppler waveforms are within normal limits at rest bilaterally. Bilateral great toe pressure indicate adequate perfusion for vascular healing.  [name], RVS 08/25/2012, 1:06 PM

## 2012-08-25 NOTE — Progress Notes (Signed)
ANTIBIOTIC CONSULT NOTE - INITIAL  Pharmacy Consult for Vancomycin Indication: Toe Infection  No Known Allergies  Patient Measurements: Height: 5\' 7"  (170.2 cm) Weight: 177 lb 11.1 oz (80.6 kg) IBW/kg (Calculated) : 61.6   Vital Signs: Temp: 98.3 F (36.8 C) (06/11 0500) Temp src: Oral (06/11 0500) BP: 110/63 mmHg (06/11 0500) Pulse Rate: 74 (06/11 0500) Intake/Output from previous day:   Intake/Output from this shift:    Labs:  Recent Labs  08/22/12 1348 08/24/12 2310 08/24/12 2327  WBC 6.4 6.0  --   HGB 12.3 12.3 12.2  PLT 153 176  --   CREATININE 1.10  --  1.30*   Estimated Creatinine Clearance: 59.1 ml/min (by C-G formula based on Cr of 1.3). No results found for this basename: VANCOTROUGH, Leodis Binet, VANCORANDOM, GENTTROUGH, GENTPEAK, GENTRANDOM, TOBRATROUGH, TOBRAPEAK, TOBRARND, AMIKACINPEAK, AMIKACINTROU, AMIKACIN,  in the last 72 hours   Microbiology: Recent Results (from the past 720 hour(s))  URINE CULTURE     Status: None   Collection Time    08/22/12  1:09 PM      Result Value Range Status   Specimen Description URINE, CLEAN CATCH   Final   Special Requests NONE   Final   Culture  Setup Time 08/23/2012 12:39   Final   Colony Count 50,000 COLONIES/ML   Final   Culture     Final   Value: Multiple bacterial morphotypes present, none predominant. Suggest appropriate recollection if clinically indicated.   Report Status 08/24/2012 FINAL   Final    Medical History: Past Medical History  Diagnosis Date  . Dysrhythmia     Left sided heart diease  . Mental disorder   . Bipolar affective disorder, mixed, in partial or remission   . Headache(784.0)   . Anxiety   . Renal disorder   . Kidney stones   . Depression   . 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 History of gallstones     Medications:  Scheduled:  . heparin  5,000 Units Subcutaneous Q8H  . hydrOXYzine  25 mg Oral QHS  . lithium carbonate  300 mg Oral QPM  .  pregabalin  75 mg Oral QPM  . QUEtiapine  800 mg Oral QHS  . traZODone  400 mg Oral QHS   Infusions:   Assessment: 47 yo s/p surgery for ingrown toenail of left first toe.  Pt c/o pain, redness, swelling of toe.  Radiology report for 6/7 said no osteomyelitis, but may do MRI to recheck.  Goal of Therapy:  Vancomycin trough level 10-15 mcg/ml If MRI shows osteo- increase trough goal  Plan:   Vancomycin 1Gm IV q12h.  CrCl~61 (N)  F/U SCr/levels/cultures/MRI results   [name] 08/25/2012,5:29 AM

## 2012-08-25 NOTE — Consult Note (Signed)
RI have reviewed the patient's current medications.eason for Consult: Cellulitis right great toe Referring Physician: Dr Eddie North is an 47 y.o. female.  HPI: ` Patient is a 47 year old woman who presents with a infection of her right great toe secondary to paronychia infection. Patient states she's been to the emergency room several times states that there was no improvement in her foot and she is seen today in consultation after admission for cellulitis and paronychia infection right great toe.  Past Medical History  Diagnosis Date  . Dysrhythmia     Left sided heart diease  . Mental disorder   . Bipolar affective disorder, mixed, in partial or remission   . Headache(784.0)   . Anxiety   . Renal disorder   . Kidney stones   . Depression   . 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 History of gallstones     Past Surgical History  Procedure Laterality Date  . Vaginal hysterectomy    . Cholecystectomy    . Total abdominal hysterectomy w/ bilateral salpingoophorectomy    . Foot surgery      right x 2  . Colonoscopy      2001, 2008.  Father has colon cancer  . Bunionectomy      11/14/2011    Family History  Problem Relation Age of Onset  . Colon cancer Father 33  . Stomach cancer Neg Hx   . Rectal cancer Neg Hx   . Esophageal cancer Neg Hx   . Breast cancer Mother   . Breast cancer Father   . Uterine cancer Mother     Social History:  reports that she has never smoked. She has never used smokeless tobacco. She reports that she does not drink alcohol or use illicit drugs.  Allergies: No Known Allergies  Medications: I have reviewed the patient's current medications.  Results for orders placed during the hospital encounter of 08/24/12 (from the past 48 hour(s))  CBC WITH DIFFERENTIAL     Status: Abnormal   Collection Time    08/24/12 11:10 PM      Result Value Range   WBC 6.0  4.0 - 10.5 K/uL   RBC 4.19  3.87 - 5.11 MIL/uL    Hemoglobin 12.3  12.0 - 15.0 g/dL   HCT 16.1 (*) 09.6 - 04.5 %   MCV 84.5  78.0 - 100.0 fL   MCH 29.4  26.0 - 34.0 pg   MCHC 34.7  30.0 - 36.0 g/dL   RDW 40.9  81.1 - 91.4 %   Platelets 176  150 - 400 K/uL   Neutrophils Relative % 71  43 - 77 %   Neutro Abs 4.2  1.7 - 7.7 K/uL   Lymphocytes Relative 23  12 - 46 %   Lymphs Abs 1.4  0.7 - 4.0 K/uL   Monocytes Relative 6  3 - 12 %   Monocytes Absolute 0.4  0.1 - 1.0 K/uL   Eosinophils Relative 0  0 - 5 %   Eosinophils Absolute 0.0  0.0 - 0.7 K/uL   Basophils Relative 0  0 - 1 %   Basophils Absolute 0.0  0.0 - 0.1 K/uL  POCT I-STAT, CHEM 8     Status: Abnormal   Collection Time    08/24/12 11:27 PM      Result Value Range   Sodium 142  135 - 145 mEq/L   Potassium 3.6  3.5 - 5.1 mEq/L  Chloride 104  96 - 112 mEq/L   BUN 14  6 - 23 mg/dL   Creatinine, Ser 1.61 (*) 0.50 - 1.10 mg/dL   Glucose, Bld 89  70 - 99 mg/dL   Calcium, Ion 0.96  0.45 - 1.23 mmol/L   TCO2 30  0 - 100 mmol/L   Hemoglobin 12.2  12.0 - 15.0 g/dL   HCT 40.9  81.1 - 91.4 %    Ir Fluoro Guide Cv Line Left  08/25/2012   *RADIOLOGY REPORT*  Clinical Data: Patient has osteomyelitis of the right great toe in need of long-term IV access for antibiotics.  Request for PICC line placement.  PICC LINE PLACEMENT WITH ULTRASOUND AND FLUOROSCOPIC  GUIDANCE  Fluoroscopy Time: 0 minutes, 42 seconds  The left arm was prepped with chlorhexidine, draped in the usual sterile fashion using maximum barrier technique (cap and mask, sterile gown, sterile gloves, large sterile sheet, hand hygiene and cutaneous antisepsis) and infiltrated locally with 1% Lidocaine.  Ultrasound demonstrated patency of the left brachial vein, and this was documented with an image.  Under real-time ultrasound guidance, this vein was accessed with a 21 gauge micropuncture needle and image documentation was performed.  The needle was exchanged over a guidewire for a peel-away sheath through which a five [place] single  lumen PICC trimmed to 46 cm was advanced, positioned with its tip at the lower SVC/right atrial junction.  Fluoroscopy during the procedure and fluoro spot radiograph confirms appropriate catheter position.  The catheter was flushed, secured to the skin with Prolene sutures, and covered with a sterile dressing.  Complications:  None  IMPRESSION: Successful left arm PICC line placement with ultrasound and fluoroscopic guidance.  The catheter is ready for use.  Read by Brayton El PA-C   Original Report Authenticated By: Richarda Overlie, M.D.   Ir US Guide Vasc Access Left  08/25/2012   *RADIOLOGY REPORT*  Clinical Data: Patient has osteomyelitis of the right great toe in need of long-term IV access for antibiotics.  Request for PICC line placement.  PICC LINE PLACEMENT WITH ULTRASOUND AND FLUOROSCOPIC  GUIDANCE  Fluoroscopy Time: 0 minutes, 42 seconds  The left arm was prepped with chlorhexidine, draped in the usual sterile fashion using maximum barrier technique (cap and mask, sterile gown, sterile gloves, large sterile sheet, hand hygiene and cutaneous antisepsis) and infiltrated locally with 1% Lidocaine.  Ultrasound demonstrated patency of the left brachial vein, and this was documented with an image.  Under real-time ultrasound guidance, this vein was accessed with a 21 gauge micropuncture needle and image documentation was performed.  The needle was exchanged over a guidewire for a peel-away sheath through which a five [place] single lumen PICC trimmed to 46 cm was advanced, positioned with its tip at the lower SVC/right atrial junction.  Fluoroscopy during the procedure and fluoro spot radiograph confirms appropriate catheter position.  The catheter was flushed, secured to the skin with Prolene sutures, and covered with a sterile dressing.  Complications:  None  IMPRESSION: Successful left arm PICC line placement with ultrasound and fluoroscopic guidance.  The catheter is ready for use.  Read by Brayton El  PA-C   Original Report Authenticated By: Richarda Overlie, M.D.   Dg Toe Great Right  08/24/2012   *RADIOLOGY REPORT*  Clinical Data: Pain, swelling, and wall great toe after podiatrist clips toe nail on 08/19/2012.  RIGHT GREAT TOE  Comparison: Right foot 08/21/2012  Findings: Apparent soft tissue swelling of the right first toe. Underlying bones  appear intact.  No evidence of acute fracture or subluxation.  No focal bone destruction or cortical erosion.  No radiopaque soft tissue foreign bodies or gas collections. Postoperative changes in the right first tarsometatarsal region.  IMPRESSION: Soft tissue swelling about the right first toe.  No acute bony abnormalities identified.   Original Report Authenticated By: Burman Nieves, M.D.    Review of Systems  All other systems reviewed and are negative.   Blood pressure 115/81, pulse 87, temperature 98 F (36.7 C), temperature source Oral, resp. rate 18, height 5\' 7"  (1.702 m), weight 80.6 kg (177 lb 11.1 oz), SpO2 96.00%. Physical Exam On examination patient has a strong dorsalis pedis pulse. She is cellulitis involving the entire toe there is no fluctuance or evidence of an abscess on the plantar aspect of her toe there is no open wounds on the plantar aspect the nail was removed and tie was decompressed. There is no signs of any purulence at this time. Her ankle-brachial indices were reviewed which showed adequate circulation. Radiograph shows no evidence of osteomyelitis. Assessment/Plan: Assessment: Paronychia infection right great toe.  Plan: The nail was removed will start daily dressing changes with dial soap cleansing daily with antibiotic ointment dressing changes daily. Anticipate this should resolve her symptoms all followup in the office. If this is not improving patient would require amputation of the right great toe. Patient is a previous patient of Dr. Lestine Box from [facility] orthopedics.  Earlena Werst V 08/25/2012, 6:08 PM

## 2012-08-26 DIAGNOSIS — G894 Chronic pain syndrome: Secondary | ICD-10-CM

## 2012-08-26 DIAGNOSIS — F319 Bipolar disorder, unspecified: Secondary | ICD-10-CM

## 2012-08-26 DIAGNOSIS — N179 Acute kidney failure, unspecified: Secondary | ICD-10-CM

## 2012-08-26 LAB — BASIC METABOLIC PANEL
CO2: 27 mEq/L (ref 19–32)
Calcium: 8.8 mg/dL (ref 8.4–10.5)
Chloride: 104 mEq/L (ref 96–112)
Glucose, Bld: 108 mg/dL — ABNORMAL HIGH (ref 70–99)
Sodium: 137 mEq/L (ref 135–145)

## 2012-08-26 LAB — CBC
Hemoglobin: 11.2 g/dL — ABNORMAL LOW (ref 12.0–15.0)
MCH: 28.1 pg (ref 26.0–34.0)
RBC: 3.99 MIL/uL (ref 3.87–5.11)

## 2012-08-26 MED ORDER — OXYCODONE HCL 5 MG PO TABS
10.0000 mg | ORAL_TABLET | ORAL | Status: DC | PRN
  Administered 2012-08-26 (×2): 10 mg via ORAL
  Filled 2012-08-26 (×2): qty 2

## 2012-08-26 MED ORDER — MUPIROCIN CALCIUM 2 % EX CREA: TOPICAL_CREAM | Freq: Every day | CUTANEOUS | Status: DC

## 2012-08-26 MED ORDER — POTASSIUM CHLORIDE CRYS ER 20 MEQ PO TBCR
40.0000 meq | EXTENDED_RELEASE_TABLET | Freq: Once | ORAL | Status: DC
  Filled 2012-08-26: qty 2

## 2012-08-26 MED ORDER — DOXYCYCLINE HYCLATE 50 MG PO CAPS: 100.0000 mg | ORAL_CAPSULE | Freq: Two times a day (BID) | ORAL | Status: DC

## 2012-08-26 MED ORDER — PROMETHAZINE HCL 25 MG PO TABS: 25.0000 mg | ORAL_TABLET | Freq: Four times a day (QID) | ORAL | Status: DC | PRN

## 2012-08-26 NOTE — Progress Notes (Addendum)
Brief Pharmacy Note: Vancomycin  R toe infection, wound cx collected, no sign of osteomyelitis per Xray. Possible MRI needed to r/o osteo, none scheduled yet. Rare GPC per wound cx.  Patient has completed 3 doses Vancomycin dosed at 1gm q12. PICC line placed 6/11, no planned IV abx at discharge. Remove PICC prior to d/c.  For simple cellulitis: trough goal 10-15 mcg/mi For osteomyelitis: trough goal 15-20 mcg/ml  Thank you,  Otho Bellows PharmD Pager [phone] 08/26/2012, 8:41 AM

## 2012-08-26 NOTE — Discharge Summary (Signed)
Physician Discharge Summary  DELANO SCARDINO NWG:[id number] DOB: 1965/12/30 DOA: 08/24/2012  PCP: Estill Cotta, MD  Admit date: 08/24/2012 Discharge date: 08/26/2012  Recommendations for Outpatient Follow-up:  1. Take doxycycline 100 mg twice daily for 2 weeks upon discharge. Patient already has appointment scheduled with [facility] orthopedics.  Discharge Diagnoses:  Principal Problem:   Infection of left foot Active Problems:   Bipolar 1 disorder   Schizoaffective disorder, bipolar type   Ingrown left greater toenail   Acute renal failure  Discharge Condition: medically stable for discharge home today  Diet recommendation: as tolerated  History of present illness:  47 y.o. female with past medical history of bipolar disorder who presented to [facility] ED 08/24/2012 with progressively worsening pain, redness and swelling of left toe after having ingrown toenail. Patient has been treated in the ED with 2 outpatient courses of antibiotics but swelling and redness was progressively worse. X ray confirmed soft itssue swelling of left toe but no acute boy destruction. Patient was started on vancomycin for infection of the left foot toe.   Assessment/Plan:   Principal Problem:  Infection of left foot  - Patient has received 3 days of IV vancomycin. We will transition to by mouth doxycycline 100 mg twice daily for next 2 weeks on discharge. Patient instructed to followup with [facility] orthopedics per her scheduled appointment. - ABI study negative for occlusion - Pain management per home medications. Patient is under pain management contract. She does not need pain medication prescriptions on discharge.  Active Problems:  Bipolar 1 disorder  - Continue lithium 300 mg at bedtime  Schizoaffective disorder  - Continue Seroquel 800 mg at bedtime  Acute renal failure  - Secondary to dehydration - Creatinine is within normal limits. Resolved with IV fluids.   Code Status: full code   Family Communication: family not at the bedside     Manson Passey, MD  [facility]  Pager [phone]   Consultants:  Orthopedic surgery Procedures:  ABI to be done today 08/25/2012 Antibiotics:  Vancomycin 08/24/2012 -->08/26/2012 Doxycycline 100 mg PO BID for 2 weeks upon discharge    Discharge Exam: Filed Vitals:   08/26/12 0607  BP: 101/65  Pulse: 72  Temp: 97.4 F (36.3 C)  Resp: 16   Filed Vitals:   08/25/12 0500 08/25/12 1343 08/25/12 2151 08/26/12 0607  BP: 110/63 115/81 110/72 101/65  Pulse: 74 87 73 72  Temp: 98.3 F (36.8 C) 98 F (36.7 C) 98 F (36.7 C) 97.4 F (36.3 C)  TempSrc: Oral  Oral Oral  Resp: 16 18 20 16   Height: 5\' 7"  (1.702 m)     Weight: 80.6 kg (177 lb 11.1 oz)     SpO2: 95% 96% 98% 99%    General: Pt is alert, follows commands appropriately, not in acute distress Cardiovascular: Regular rate and rhythm, S1/S2 +, no murmurs, no rubs, no gallops Respiratory: Clear to auscultation bilaterally, no wheezing, no crackles, no rhonchi Abdominal: Soft, non tender, non distended, bowel sounds +, no guarding Extremities: no edema, no cyanosis, pulses palpable bilaterally DP and PT; left great toe infection Neuro: Grossly nonfocal  Discharge Instructions  Discharge Orders   Future Orders Complete By Expires     Call MD for:  difficulty breathing, headache or visual disturbances  As directed     Call MD for:  persistant dizziness or light-headedness  As directed     Call MD for:  persistant nausea and vomiting  As directed  Call MD for:  severe uncontrolled pain  As directed     Diet - low sodium heart healthy  As directed     Discharge instructions  As directed     Comments:      Wound care:The nail was removed and per orthopedics start daily dressing changes with dial soap cleansing daily with antibiotic ointment dressing changes daily. Anticipate this should resolve the symptoms. If this is not improving patient would require amputation of the right  great toe. Patient is a previous patient of Dr. Lestine Box from [facility] orthopedics. Patient reported she has an appointment scheduled with [facility] Orthopedic office.    Increase activity slowly  As directed         Medication List    STOP taking these medications       sulfamethoxazole-trimethoprim 800-160 MG per tablet  Commonly known as:  BACTRIM DS      TAKE these medications       AMBIEN 10 MG tablet  Generic drug:  zolpidem  Take 10 mg by mouth at bedtime as needed for sleep. For sleep     hydrOXYzine 25 MG capsule  Commonly known as:  VISTARIL  Take 25 mg by mouth at bedtime.     lithium carbonate 300 MG CR tablet  Commonly known as:  LITHOBID  Take 300 mg by mouth every evening.     mupirocin cream 2 %  Commonly known as:  BACTROBAN  Apply topically daily. Please apply to left great toe as prescribed  daily     oxyCODONE 5 MG immediate release tablet  Commonly known as:  Oxy IR/ROXICODONE  Take 5-10 mg by mouth every 6 (six) hours as needed for pain.     pregabalin 75 MG capsule  Commonly known as:  LYRICA  Take 75 mg by mouth every evening.     promethazine 25 MG tablet  Commonly known as:  PHENERGAN  Take 1 tablet (25 mg total) by mouth every 6 (six) hours as needed for nausea.     QUEtiapine 400 MG tablet  Commonly known as:  SEROQUEL  Take 800 mg by mouth at bedtime.     traZODone 100 MG tablet  Commonly known as:  DESYREL  Take 400 mg by mouth at bedtime.           Follow-up Information   Follow up with DUDA,MARCUS V, MD In 2 weeks.   Contact information:   [address] [place] [address] [phone]        The results of significant diagnostics from this hospitalization (including imaging, microbiology, ancillary and laboratory) are listed below for reference.    Significant Diagnostic Studies: Ct Abdomen Pelvis Wo Contrast  07/28/2012   *RADIOLOGY REPORT*  Clinical Data: Back pain, nausea, history of kidney stones.  CT  ABDOMEN AND PELVIS WITHOUT CONTRAST  Technique:  Multidetector CT imaging of the abdomen and pelvis was performed following the standard protocol without intravenous contrast.  Comparison: 05/03/2012 radiograph, 08/17/2011 CT  Findings: Lung bases clear.  Heart size within normal limits.  Organ abnormality/lesion detection is limited in the absence of intravenous contrast. Within this limitation, unremarkable liver, spleen, pancreas, adrenal glands.  Status post cholecystectomy.  Mild biliary ductal prominence is similar to prior.  Symmetric renal size no hydronephrosis or hydroureter.  No urinary tract calculi identified.  No CT evidence for colitis.  Moderate stool burden.  Appendix within normal limits.  Small bowel loops are normal course and caliber.  No  free intraperitoneal air or fluid.  No lymphadenopathy.  Normal caliber aorta and branch vessels.  Decompressed bladder.  Absent uterus.  No adnexal mass.  No acute osseous finding. L5 S1 disc bulge with moderate to severe central canal narrowing.  L4-5 broad-based disc bulge and ligamentum flavum hypertrophy results in moderate to severe central canal narrowing.  IMPRESSION: No hydronephrosis or urinary tract calculi identified.  See above.   Original Report Authenticated By: Jearld Lesch, M.D.   Ir Fluoro Guide Cv Line Left  08/25/2012   *RADIOLOGY REPORT*  Clinical Data: Patient has osteomyelitis of the right great toe in need of long-term IV access for antibiotics.  Request for PICC line placement.  PICC LINE PLACEMENT WITH ULTRASOUND AND FLUOROSCOPIC  GUIDANCE  Fluoroscopy Time: 0 minutes, 42 seconds  The left arm was prepped with chlorhexidine, draped in the usual sterile fashion using maximum barrier technique (cap and mask, sterile gown, sterile gloves, large sterile sheet, hand hygiene and cutaneous antisepsis) and infiltrated locally with 1% Lidocaine.  Ultrasound demonstrated patency of the left brachial vein, and this was documented with an  image.  Under real-time ultrasound guidance, this vein was accessed with a 21 gauge micropuncture needle and image documentation was performed.  The needle was exchanged over a guidewire for a peel-away sheath through which a five [place] single lumen PICC trimmed to 46 cm was advanced, positioned with its tip at the lower SVC/right atrial junction.  Fluoroscopy during the procedure and fluoro spot radiograph confirms appropriate catheter position.  The catheter was flushed, secured to the skin with Prolene sutures, and covered with a sterile dressing.  Complications:  None  IMPRESSION: Successful left arm PICC line placement with ultrasound and fluoroscopic guidance.  The catheter is ready for use.  Read by Brayton El PA-C   Original Report Authenticated By: Richarda Overlie, M.D.   Ir US Guide Vasc Access Left  08/25/2012   *RADIOLOGY REPORT*  Clinical Data: Patient has osteomyelitis of the right great toe in need of long-term IV access for antibiotics.  Request for PICC line placement.  PICC LINE PLACEMENT WITH ULTRASOUND AND FLUOROSCOPIC  GUIDANCE  Fluoroscopy Time: 0 minutes, 42 seconds  The left arm was prepped with chlorhexidine, draped in the usual sterile fashion using maximum barrier technique (cap and mask, sterile gown, sterile gloves, large sterile sheet, hand hygiene and cutaneous antisepsis) and infiltrated locally with 1% Lidocaine.  Ultrasound demonstrated patency of the left brachial vein, and this was documented with an image.  Under real-time ultrasound guidance, this vein was accessed with a 21 gauge micropuncture needle and image documentation was performed.  The needle was exchanged over a guidewire for a peel-away sheath through which a five [place] single lumen PICC trimmed to 46 cm was advanced, positioned with its tip at the lower SVC/right atrial junction.  Fluoroscopy during the procedure and fluoro spot radiograph confirms appropriate catheter position.  The catheter was flushed, secured  to the skin with Prolene sutures, and covered with a sterile dressing.  Complications:  None  IMPRESSION: Successful left arm PICC line placement with ultrasound and fluoroscopic guidance.  The catheter is ready for use.  Read by Brayton El PA-C   Original Report Authenticated By: Richarda Overlie, M.D.   Dg Foot Complete Right  08/21/2012   *RADIOLOGY REPORT*  Clinical Data: Status post recent surgery for ingrown toenail. Right great toe pain.  RIGHT FOOT COMPLETE - 3+ VIEW  Comparison: Right foot radiographs performed 02/28/2010  Findings: There is  no evidence of fracture or dislocation.  No definite osseous erosion is seen to suggest osteomyelitis.  The right great toe demonstrates mild soft tissue swelling but is otherwise grossly unremarkable.  The joint spaces are preserved.  There is no evidence of talar subluxation; the subtalar joint is unremarkable in appearance. Postoperative change is noted along the first metatarsal, with screws fusing the medial cuneiform and first metatarsal.  An os peroneum is seen.  IMPRESSION:  1.  No evidence of fracture or dislocation. 2.  No definite osseous erosion seen to suggest osteomyelitis. 3.  Soft tissue swelling noted about the right great toe. 4.  Os peroneum seen.   Original Report Authenticated By: Tonia Ghent, M.D.   Dg Toe Great Right  08/24/2012   *RADIOLOGY REPORT*  Clinical Data: Pain, swelling, and wall great toe after podiatrist clips toe nail on 08/19/2012.  RIGHT GREAT TOE  Comparison: Right foot 08/21/2012  Findings: Apparent soft tissue swelling of the right first toe. Underlying bones appear intact.  No evidence of acute fracture or subluxation.  No focal bone destruction or cortical erosion.  No radiopaque soft tissue foreign bodies or gas collections. Postoperative changes in the right first tarsometatarsal region.  IMPRESSION: Soft tissue swelling about the right first toe.  No acute bony abnormalities identified.   Original Report Authenticated By:  Burman Nieves, M.D.    Microbiology: Recent Results (from the past 240 hour(s))  URINE CULTURE     Status: None   Collection Time    08/22/12  1:09 PM      Result Value Range Status   Specimen Description URINE, CLEAN CATCH   Final   Special Requests NONE   Final   Culture  Setup Time 08/23/2012 12:39   Final   Colony Count 50,000 COLONIES/ML   Final   Culture     Final   Value: Multiple bacterial morphotypes present, none predominant. Suggest appropriate recollection if clinically indicated.   Report Status 08/24/2012 FINAL   Final  WOUND CULTURE     Status: None   Collection Time    08/25/12 12:28 PM      Result Value Range Status   Specimen Description WOUND RT GREAT TOE   Final   Special Requests Normal   Final   Gram Stain     Final   Value: RARE WBC PRESENT, PREDOMINANTLY PMN     RARE SQUAMOUS EPITHELIAL CELLS PRESENT     RARE GRAM POSITIVE COCCI     IN PAIRS   Culture Culture reincubated for better growth   Final   Report Status PENDING   Incomplete     Labs: Basic Metabolic Panel:  Recent Labs Lab 08/22/12 1348 08/24/12 2327 08/26/12 0500  NA 140 142 137  K 4.0 3.6 3.0*  CL 103 104 104  CO2 29  --  27  GLUCOSE 106* 89 108*  BUN 8 14 10   CREATININE 1.10 1.30* 0.79  CALCIUM 9.3  --  8.8   Liver Function Tests: No results found for this basename: AST, ALT, ALKPHOS, BILITOT, PROT, ALBUMIN,  in the last 168 hours No results found for this basename: LIPASE, AMYLASE,  in the last 168 hours No results found for this basename: AMMONIA,  in the last 168 hours CBC:  Recent Labs Lab 08/22/12 1348 08/24/12 2310 08/24/12 2327 08/26/12 0500  WBC 6.4 6.0  --  4.4  NEUTROABS 4.7 4.2  --   --   HGB 12.3 12.3 12.2 11.2*  HCT  35.7* 35.4* 36.0 33.2*  MCV 86.9 84.5  --  83.2  PLT 153 176  --  149*   Cardiac Enzymes: No results found for this basename: CKTOTAL, CKMB, CKMBINDEX, TROPONINI,  in the last 168 hours BNP: BNP (last 3 results) No results found for  this basename: PROBNP,  in the last 8760 hours CBG: No results found for this basename: GLUCAP,  in the last 168 hours  Time coordinating discharge: Over 30 minutes  Signed:  Manson Passey, MD  [facility]  08/26/2012, 11:16 AM  Pager #: [phone]

## 2012-08-26 NOTE — Progress Notes (Signed)
Pt was stable at time of discharge. Did one last dressing change on foot. Reviewed discharge packet and prescriptions with pt. Ortho tech set up pt with post op shoe. Pt did not have further questions. Pt left with d/c packer, prescriptions, and post op shoe.

## 2012-08-27 NOTE — ED Provider Notes (Signed)
Medical screening examination/treatment/procedure(s) were performed by non-physician practitioner and as supervising physician I was immediately available for consultation/collaboration.   Christopher J. Pollina, MD 08/27/12 1050 

## 2012-08-28 LAB — WOUND CULTURE: Special Requests: NORMAL

## 2012-08-31 NOTE — ED Provider Notes (Signed)
History     CSN: [id number]  Arrival date & time 08/24/12  2111   First MD Initiated Contact with Patient 08/24/12 2231      Chief Complaint  Patient presents with  . Wound Check    (Consider location/radiation/quality/duration/timing/severity/associated sxs/prior treatment) HPI  Past Medical History  Diagnosis Date  . Dysrhythmia     Left sided heart diease  . Mental disorder   . Bipolar affective disorder, mixed, in partial or remission   . Headache(784.0)   . Anxiety   . Renal disorder   . Kidney stones   . Depression   . 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 History of gallstones     Past Surgical History  Procedure Laterality Date  . Vaginal hysterectomy    . Cholecystectomy    . Total abdominal hysterectomy w/ bilateral salpingoophorectomy    . Foot surgery      right x 2  . Colonoscopy      2001, 2008.  Father has colon cancer  . Bunionectomy      11/14/2011    Family History  Problem Relation Age of Onset  . Colon cancer Father 59  . Stomach cancer Neg Hx   . Rectal cancer Neg Hx   . Esophageal cancer Neg Hx   . Breast cancer Mother   . Breast cancer Father   . Uterine cancer Mother     History  Substance Use Topics  . Smoking status: Never Smoker   . Smokeless tobacco: Never Used  . Alcohol Use: No    OB History   Grav Para Term Preterm Abortions TAB SAB Ect Mult Living                  Review of Systems  Allergies  Review of patient's allergies indicates no known allergies.  Home Medications   Current Outpatient Rx  Name  Route  Sig  Dispense  Refill  . hydrOXYzine (VISTARIL) 25 MG capsule   Oral   Take 25 mg by mouth at bedtime.         Marland Kitchen lithium carbonate (LITHOBID) 300 MG CR tablet   Oral   Take 300 mg by mouth every evening.          Marland Kitchen oxyCODONE (OXY IR/ROXICODONE) 5 MG immediate release tablet   Oral   Take 5-10 mg by mouth every 6 (six) hours as needed for pain.          . pregabalin  (LYRICA) 75 MG capsule   Oral   Take 75 mg by mouth every evening.          Marland Kitchen QUEtiapine (SEROQUEL) 400 MG tablet   Oral   Take 800 mg by mouth at bedtime.          . traZODone (DESYREL) 100 MG tablet   Oral   Take 400 mg by mouth at bedtime.         Marland Kitchen zolpidem (AMBIEN) 10 MG tablet   Oral   Take 10 mg by mouth at bedtime as needed for sleep. For sleep         . doxycycline (VIBRAMYCIN) 50 MG capsule   Oral   Take 2 capsules (100 mg total) by mouth 2 (two) times daily.   28 capsule   0   . mupirocin cream (BACTROBAN) 2 %   Topical   Apply topically daily. Please apply to left great toe as prescribed  daily  15 g   0   . promethazine (PHENERGAN) 25 MG tablet   Oral   Take 1 tablet (25 mg total) by mouth every 6 (six) hours as needed for nausea.   60 tablet   0     BP 101/65  Pulse 72  Temp(Src) 97.4 F (36.3 C) (Oral)  Resp 16  Ht 5\' 7"  (1.702 m)  Wt 177 lb 11.1 oz (80.6 kg)  BMI 27.82 kg/m2  SpO2 99%  Physical Exam  ED Course  Procedures (including critical care time)  Labs Reviewed  CBC WITH DIFFERENTIAL - Abnormal; Notable for the following:    HCT 35.4 (*)    All other components within normal limits  CBC - Abnormal; Notable for the following:    Hemoglobin 11.2 (*)    HCT 33.2 (*)    Platelets 149 (*)    All other components within normal limits  BASIC METABOLIC PANEL - Abnormal; Notable for the following:    Potassium 3.0 (*)    Glucose, Bld 108 (*)    All other components within normal limits  POCT I-STAT, CHEM 8 - Abnormal; Notable for the following:    Creatinine, Ser 1.30 (*)    All other components within normal limits  WOUND CULTURE   No results found.   1. Ingrown left greater toenail   2. Infection of left foot   3. Bipolar 1 disorder   4. Chronic pain syndrome   5. Schizoaffective disorder, bipolar type   6. Acute renal failure       MDM          Arman Filter, NP 08/31/12 2038

## 2012-09-01 NOTE — ED Provider Notes (Signed)
Medical screening examination/treatment/procedure(s) were performed by non-physician practitioner and as supervising physician I was immediately available for consultation/collaboration.   Dione Booze, MD 09/01/12 [phone]

## 2012-09-03 NOTE — ED Provider Notes (Signed)
Medical screening examination/treatment/procedure(s) were performed by non-physician practitioner and as supervising physician I was immediately available for consultation/collaboration.  Dione Booze, MD 09/03/12 [phone]

## 2012-09-03 NOTE — ED Provider Notes (Signed)
Medical screening examination/treatment/procedure(s) were performed by non-physician practitioner and as supervising physician I was immediately available for consultation/collaboration  Sheronda Parran, MD 09/03/12 1603 

## 2012-09-09 ENCOUNTER — Other Ambulatory Visit (HOSPITAL_COMMUNITY): Payer: Self-pay | Admitting: Orthopedic Surgery

## 2012-09-09 DIAGNOSIS — L0390 Cellulitis, unspecified: Secondary | ICD-10-CM

## 2012-09-10 ENCOUNTER — Ambulatory Visit (HOSPITAL_COMMUNITY)
Admission: RE | Admit: 2012-09-10 | Discharge: 2012-09-10 | Disposition: A | Discharge status: Home / Self Care | Payer: Medicare Other | Source: Ambulatory Visit | Attending: Orthopedic Surgery | Admitting: Orthopedic Surgery

## 2012-09-10 DIAGNOSIS — M7989 Other specified soft tissue disorders: Secondary | ICD-10-CM | POA: Insufficient documentation

## 2012-09-10 DIAGNOSIS — L03039 Cellulitis of unspecified toe: Secondary | ICD-10-CM | POA: Insufficient documentation

## 2012-09-10 DIAGNOSIS — L0390 Cellulitis, unspecified: Secondary | ICD-10-CM

## 2012-09-10 DIAGNOSIS — L02619 Cutaneous abscess of unspecified foot: Secondary | ICD-10-CM | POA: Insufficient documentation

## 2012-09-10 MED ORDER — GADOBENATE DIMEGLUMINE 529 MG/ML IV SOLN
15.0000 mL | Freq: Once | INTRAVENOUS | Status: AC
  Administered 2012-09-10: 15 mL via INTRAVENOUS

## 2012-09-12 ENCOUNTER — Encounter (HOSPITAL_BASED_OUTPATIENT_CLINIC_OR_DEPARTMENT_OTHER): Payer: Self-pay | Admitting: *Deleted

## 2012-09-12 ENCOUNTER — Emergency Department (HOSPITAL_BASED_OUTPATIENT_CLINIC_OR_DEPARTMENT_OTHER)
Admission: EM | Admit: 2012-09-12 | Discharge: 2012-09-12 | Disposition: A | Discharge status: Home / Self Care | Payer: Medicare Other | Attending: Emergency Medicine | Admitting: Emergency Medicine

## 2012-09-12 DIAGNOSIS — Y929 Unspecified place or not applicable: Secondary | ICD-10-CM | POA: Insufficient documentation

## 2012-09-12 DIAGNOSIS — X58XXXA Exposure to other specified factors, initial encounter: Secondary | ICD-10-CM | POA: Insufficient documentation

## 2012-09-12 DIAGNOSIS — Z8701 Personal history of pneumonia (recurrent): Secondary | ICD-10-CM | POA: Insufficient documentation

## 2012-09-12 DIAGNOSIS — M869 Osteomyelitis, unspecified: Secondary | ICD-10-CM

## 2012-09-12 DIAGNOSIS — Z87442 Personal history of urinary calculi: Secondary | ICD-10-CM | POA: Insufficient documentation

## 2012-09-12 DIAGNOSIS — Z8719 Personal history of other diseases of the digestive system: Secondary | ICD-10-CM | POA: Insufficient documentation

## 2012-09-12 DIAGNOSIS — Y939 Activity, unspecified: Secondary | ICD-10-CM | POA: Insufficient documentation

## 2012-09-12 DIAGNOSIS — F1910 Other psychoactive substance abuse, uncomplicated: Secondary | ICD-10-CM | POA: Insufficient documentation

## 2012-09-12 DIAGNOSIS — Z8679 Personal history of other diseases of the circulatory system: Secondary | ICD-10-CM | POA: Insufficient documentation

## 2012-09-12 DIAGNOSIS — Z87448 Personal history of other diseases of urinary system: Secondary | ICD-10-CM | POA: Insufficient documentation

## 2012-09-12 DIAGNOSIS — F411 Generalized anxiety disorder: Secondary | ICD-10-CM | POA: Insufficient documentation

## 2012-09-12 DIAGNOSIS — R5381 Other malaise: Secondary | ICD-10-CM | POA: Insufficient documentation

## 2012-09-12 DIAGNOSIS — Z79899 Other long term (current) drug therapy: Secondary | ICD-10-CM | POA: Insufficient documentation

## 2012-09-12 DIAGNOSIS — F3177 Bipolar disorder, in partial remission, most recent episode mixed: Secondary | ICD-10-CM | POA: Insufficient documentation

## 2012-09-12 LAB — CBC WITH DIFFERENTIAL/PLATELET
Basophils Absolute: 0 10*3/uL (ref 0.0–0.1)
Basophils Relative: 0 % (ref 0–1)
Eosinophils Relative: 0 % (ref 0–5)
HCT: 37 % (ref 36.0–46.0)
Hemoglobin: 13.1 g/dL (ref 12.0–15.0)
Lymphocytes Relative: 36 % (ref 12–46)
MCHC: 35.4 g/dL (ref 30.0–36.0)
MCV: 85.1 fL (ref 78.0–100.0)
Monocytes Absolute: 0.3 10*3/uL (ref 0.1–1.0)
Monocytes Relative: 5 % (ref 3–12)
RDW: 12.1 % (ref 11.5–15.5)

## 2012-09-12 LAB — BASIC METABOLIC PANEL
BUN: 15 mg/dL (ref 6–23)
CO2: 27 mEq/L (ref 19–32)
Calcium: 9.6 mg/dL (ref 8.4–10.5)
Chloride: 102 mEq/L (ref 96–112)
Creatinine, Ser: 1.3 mg/dL — ABNORMAL HIGH (ref 0.50–1.10)

## 2012-09-12 MED ORDER — SODIUM CHLORIDE 0.9 % IV BOLUS (SEPSIS)
1000.0000 mL | Freq: Once | INTRAVENOUS | Status: AC
  Administered 2012-09-12: 1000 mL via INTRAVENOUS

## 2012-09-12 MED ORDER — HYDROMORPHONE HCL PF 1 MG/ML IJ SOLN
1.0000 mg | INTRAMUSCULAR | Status: DC | PRN
  Administered 2012-09-12: 1 mg via INTRAVENOUS
  Filled 2012-09-12: qty 1

## 2012-09-12 MED ORDER — ONDANSETRON HCL 4 MG/2ML IJ SOLN
4.0000 mg | Freq: Four times a day (QID) | INTRAMUSCULAR | Status: DC | PRN
  Administered 2012-09-12: 4 mg via INTRAVENOUS
  Filled 2012-09-12: qty 2

## 2012-09-12 MED ORDER — VANCOMYCIN HCL IN DEXTROSE 1-5 GM/200ML-% IV SOLN
1000.0000 mg | Freq: Once | INTRAVENOUS | Status: AC
  Administered 2012-09-12: 1000 mg via INTRAVENOUS
  Filled 2012-09-12: qty 200

## 2012-09-12 MED ORDER — FENTANYL CITRATE 0.05 MG/ML IJ SOLN
25.0000 ug | Freq: Once | INTRAMUSCULAR | Status: AC
  Administered 2012-09-12: 25 ug via INTRAVENOUS
  Filled 2012-09-12: qty 2

## 2012-09-12 MED ORDER — FENTANYL CITRATE 0.05 MG/ML IJ SOLN
50.0000 ug | Freq: Once | INTRAMUSCULAR | Status: AC
  Administered 2012-09-12: 50 ug via INTRAVENOUS
  Filled 2012-09-12: qty 2

## 2012-09-12 MED ORDER — SODIUM CHLORIDE 0.9 % IV SOLN
3.0000 g | Freq: Once | INTRAVENOUS | Status: AC
  Administered 2012-09-12: 3 g via INTRAVENOUS
  Filled 2012-09-12: qty 3

## 2012-09-12 NOTE — ED Notes (Signed)
Pt had ingrown toenail clipped several weeks ago. Was seen at [facility] after procedure and was admitted for bone infection in right great toe- has appt with Dr Lajoyce Corners tomorrow but pain is worse now

## 2012-09-12 NOTE — ED Provider Notes (Signed)
History    CSN: [id number] Arrival date & time 09/12/12  1439  First MD Initiated Contact with Patient 09/12/12 1527     Chief Complaint  Patient presents with  . Wound Infection   (Consider location/radiation/quality/duration/timing/severity/associated sxs/prior Treatment) HPI [name] is a 47 y.o. female who presents to ED with complaint of right great toe infection. States about a month ago had ingrown toenail excised. States since then it became infected. States it is draining. Had to be hospitalized for iv antibiotics. Discharged home on 08/26/12. States she is due to see Dr. Lajoyce Corners tomorrow but states pain is worsening and now has a new red spot on the ankle. Denies fever. Admits to malaise. States currently taking 2 percocets every 6 hrs. States still on bactrim, states that finished doxycycline.  Past Medical History  Diagnosis Date  . Dysrhythmia     Left sided heart diease  . Mental disorder   . Bipolar affective disorder, mixed, in partial or remission   . Headache(784.0)   . Anxiety   . Renal disorder   . Kidney stones   . Depression   . 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 History of gallstones    Past Surgical History  Procedure Laterality Date  . Vaginal hysterectomy    . Cholecystectomy    . Total abdominal hysterectomy w/ bilateral salpingoophorectomy    . Foot surgery      right x 2  . Colonoscopy      2001, 2008.  Father has colon cancer  . Bunionectomy      11/14/2011   Family History  Problem Relation Age of Onset  . Colon cancer Father 6  . Stomach cancer Neg Hx   . Rectal cancer Neg Hx   . Esophageal cancer Neg Hx   . Breast cancer Mother   . Breast cancer Father   . Uterine cancer Mother    History  Substance Use Topics  . Smoking status: Never Smoker   . Smokeless tobacco: Never Used  . Alcohol Use: No   OB History   Grav Para Term Preterm Abortions TAB SAB Ect Mult Living                 Review of  Systems  Constitutional: Positive for fatigue. Negative for fever and chills.  Respiratory: Negative.   Cardiovascular: Negative.   Musculoskeletal: Positive for arthralgias.  Skin: Positive for color change and wound.  Neurological: Negative for weakness and numbness.    Allergies  Review of patient's allergies indicates no known allergies.  Home Medications   Current Outpatient Rx  Name  Route  Sig  Dispense  Refill  . ciprofloxacin (CIPRO) 750 MG tablet   Oral   Take 750 mg by mouth 2 (two) times daily.         Marland Kitchen doxycycline (VIBRAMYCIN) 50 MG capsule   Oral   Take 2 capsules (100 mg total) by mouth 2 (two) times daily.   28 capsule   0   . hydrOXYzine (VISTARIL) 25 MG capsule   Oral   Take 25 mg by mouth at bedtime.         Marland Kitchen lithium carbonate (LITHOBID) 300 MG CR tablet   Oral   Take 300 mg by mouth every evening.          . mupirocin cream (BACTROBAN) 2 %   Topical   Apply topically daily. Please apply to left great toe  as prescribed  daily   15 g   0   . oxyCODONE (OXY IR/ROXICODONE) 5 MG immediate release tablet   Oral   Take 5-10 mg by mouth every 6 (six) hours as needed for pain.          . pregabalin (LYRICA) 75 MG capsule   Oral   Take 75 mg by mouth every evening.          . promethazine (PHENERGAN) 25 MG tablet   Oral   Take 1 tablet (25 mg total) by mouth every 6 (six) hours as needed for nausea.   60 tablet   0   . QUEtiapine (SEROQUEL) 400 MG tablet   Oral   Take 800 mg by mouth at bedtime.          . traZODone (DESYREL) 100 MG tablet   Oral   Take 400 mg by mouth at bedtime.         Marland Kitchen zolpidem (AMBIEN) 10 MG tablet   Oral   Take 10 mg by mouth at bedtime as needed for sleep. For sleep          BP 110/65  Pulse 81  Temp(Src) 97.3 F (36.3 C) (Oral)  Resp 16  Ht 5\' 6"  (1.676 m)  Wt 180 lb (81.647 kg)  BMI 29.07 kg/m2  SpO2 100% Physical Exam  Nursing note and vitals reviewed. Constitutional: She appears  well-developed and well-nourished. No distress.  Cardiovascular: Normal rate, regular rhythm and normal heart sounds.   Pulmonary/Chest: Effort normal and breath sounds normal. No respiratory distress. She has no wheezes. She has no rales.  Musculoskeletal:  Right great toe swelling, erythema, up to MTP joint. Tender. Purulent drainage noted. There is a erythematous patch to the anterior dorsal proximal foot that is warm to the touch, tender.   Skin: Skin is warm and dry.    ED Course  Procedures (including critical care time)  Results for orders placed during the hospital encounter of 09/12/12  CBC WITH DIFFERENTIAL      Result Value Range   WBC 4.8  4.0 - 10.5 K/uL   RBC 4.35  3.87 - 5.11 MIL/uL   Hemoglobin 13.1  12.0 - 15.0 g/dL   HCT 40.9  81.1 - 91.4 %   MCV 85.1  78.0 - 100.0 fL   MCH 30.1  26.0 - 34.0 pg   MCHC 35.4  30.0 - 36.0 g/dL   RDW 78.2  95.6 - 21.3 %   Platelets 190  150 - 400 K/uL   Neutrophils Relative % 58  43 - 77 %   Neutro Abs 2.8  1.7 - 7.7 K/uL   Lymphocytes Relative 36  12 - 46 %   Lymphs Abs 1.8  0.7 - 4.0 K/uL   Monocytes Relative 5  3 - 12 %   Monocytes Absolute 0.3  0.1 - 1.0 K/uL   Eosinophils Relative 0  0 - 5 %   Eosinophils Absolute 0.0  0.0 - 0.7 K/uL   Basophils Relative 0  0 - 1 %   Basophils Absolute 0.0  0.0 - 0.1 K/uL  BASIC METABOLIC PANEL      Result Value Range   Sodium 139  135 - 145 mEq/L   Potassium 3.6  3.5 - 5.1 mEq/L   Chloride 102  96 - 112 mEq/L   CO2 27  19 - 32 mEq/L   Glucose, Bld 98  70 - 99 mg/dL   BUN 15  6 - 23 mg/dL   Creatinine, Ser 9.60 (*) 0.50 - 1.10 mg/dL   Calcium 9.6  8.4 - 45.4 mg/dL   GFR calc non Af Amer 48 (*) >90 mL/min   GFR calc Af Amer 56 (*) >90 mL/min   Mr Foot Right W Wo Contrast  09/10/2012   *RADIOLOGY REPORT*  Clinical Data: Pain and swelling of the great toe.  MRI OF THE RIGHT FOREFOOT WITHOUT AND WITH CONTRAST  Technique:  Multiplanar, multisequence MR imaging was performed both before and  after administration of intravenous contrast.  Contrast: 15 MULTIHANCE GADOBENATE DIMEGLUMINE 529 MG/ML IV SOLN  Comparison: Radiographs 08/24/2012.  Findings: There is diffuse increased T2 signal intensity in the distal phalanx of the great toe and contrast enhancement consistent with osteomyelitis.  There is significant surrounding cellulitis. No findings to suggest septic arthritis at the interphalangeal joint.  The flexor and extensor tendons are intact.  The remaining bony structures are intact.  There are surgical changes with screws fusing the first tarsal metatarsal joint.  No complicating features.  Moderate fatty atrophy of the foot musculature.  No findings for myofasciitis or pyomyositis.  IMPRESSION:  Findings consistent with osteomyelitis involving the distal phalanx of the great toe with significant surrounding cellulitis but no discrete soft tissue abscess or septic arthritis.   Original Report Authenticated By: Rudie Meyer, M.D.   Medications  Ampicillin-Sulbactam (UNASYN) 3 g in sodium chloride 0.9 % 100 mL IVPB (0 g Intravenous Stopped 09/12/12 1822)  vancomycin (VANCOCIN) IVPB 1000 mg/200 mL premix (0 mg Intravenous Stopped 09/12/12 1722)  sodium chloride 0.9 % bolus 1,000 mL (0 mLs Intravenous Stopped 09/12/12 1935)  fentaNYL (SUBLIMAZE) injection 50 mcg (50 mcg Intravenous Given 09/12/12 1756)  fentaNYL (SUBLIMAZE) injection 25 mcg (25 mcg Intravenous Given 09/12/12 1935)    5:28 PM Spoke with Dr. Magnus Ivan. Advised to keep apt and follow up tom No results found.  1. Osteomyelitis of toe of right foot     MDM  Pt with right toe persistent cellulitis for 1mon. MRI as above, showing osteomyelitis. Given unasyn and vacomycin in ed. Pain controlled with dilaudid and fentanyl. Pt feeling better. i did discuss pt with Dr. Magnus Ivan orthopedics surgery who advised for pt to keep her outpatient apt tomorrow and be seen in the office to discuss further treatment. Pt agreeable to the plan.  She is afebrile. No elevated WBC.   Filed Vitals:   09/12/12 1930 09/12/12 1951 09/12/12 2000 09/12/12 2033  BP: 105/74 94/61 98/65  98/65  Pulse: 59 62 70   Temp:      TempSrc:      Resp:      Height:      Weight:      SpO2: 95% 96% 96%      Lottie Mussel, PA-C 09/13/12 0035

## 2012-09-13 ENCOUNTER — Other Ambulatory Visit (HOSPITAL_COMMUNITY): Payer: Self-pay | Admitting: Orthopedic Surgery

## 2012-09-13 ENCOUNTER — Encounter (HOSPITAL_COMMUNITY): Payer: Self-pay | Admitting: Pharmacy Technician

## 2012-09-14 ENCOUNTER — Other Ambulatory Visit (HOSPITAL_COMMUNITY): Payer: Self-pay | Admitting: Orthopedic Surgery

## 2012-09-14 ENCOUNTER — Encounter (HOSPITAL_COMMUNITY): Payer: Self-pay | Admitting: *Deleted

## 2012-09-14 MED ORDER — CEFAZOLIN SODIUM-DEXTROSE 2-3 GM-% IV SOLR: 2.0000 g | INTRAVENOUS | Status: AC

## 2012-09-14 NOTE — ED Provider Notes (Signed)
Medical screening examination/treatment/procedure(s) were performed by non-physician practitioner and as supervising physician I was immediately available for consultation/collaboration.   Charnette Younkin, MD 09/14/12 1459 

## 2012-09-14 NOTE — Progress Notes (Signed)
Pt denies SOB, chest pain, and being under the care of a cardiologist. Pt made aware to Stop taking Aspirin and herbal medications. Do not take any NSAIDs ie: Ibuprofen, Advil, Naproxen or any medication containing Aspirin.

## 2012-09-15 ENCOUNTER — Encounter (HOSPITAL_COMMUNITY): Payer: Self-pay | Admitting: *Deleted

## 2012-09-15 ENCOUNTER — Encounter (HOSPITAL_COMMUNITY)
Admission: RE | Disposition: A | Discharge status: Home / Self Care | Payer: Self-pay | Source: Ambulatory Visit | Attending: Orthopedic Surgery

## 2012-09-15 ENCOUNTER — Ambulatory Visit (HOSPITAL_COMMUNITY): Payer: Medicare Other | Admitting: Anesthesiology

## 2012-09-15 ENCOUNTER — Observation Stay (HOSPITAL_COMMUNITY)
Admission: RE | Admit: 2012-09-15 | Discharge: 2012-09-16 | Disposition: A | Discharge status: Home / Self Care | Payer: Medicare Other | Source: Ambulatory Visit | Attending: Orthopedic Surgery | Admitting: Orthopedic Surgery

## 2012-09-15 ENCOUNTER — Encounter (HOSPITAL_COMMUNITY): Payer: Self-pay | Admitting: Anesthesiology

## 2012-09-15 DIAGNOSIS — F411 Generalized anxiety disorder: Secondary | ICD-10-CM | POA: Insufficient documentation

## 2012-09-15 DIAGNOSIS — M86171 Other acute osteomyelitis, right ankle and foot: Secondary | ICD-10-CM

## 2012-09-15 DIAGNOSIS — F3289 Other specified depressive episodes: Secondary | ICD-10-CM | POA: Insufficient documentation

## 2012-09-15 DIAGNOSIS — F329 Major depressive disorder, single episode, unspecified: Secondary | ICD-10-CM | POA: Insufficient documentation

## 2012-09-15 DIAGNOSIS — R51 Headache: Secondary | ICD-10-CM | POA: Insufficient documentation

## 2012-09-15 DIAGNOSIS — G4730 Sleep apnea, unspecified: Secondary | ICD-10-CM | POA: Insufficient documentation

## 2012-09-15 DIAGNOSIS — R262 Difficulty in walking, not elsewhere classified: Secondary | ICD-10-CM | POA: Insufficient documentation

## 2012-09-15 DIAGNOSIS — M869 Osteomyelitis, unspecified: Principal | ICD-10-CM | POA: Insufficient documentation

## 2012-09-15 HISTORY — DX: Sleep apnea, unspecified: G47.30

## 2012-09-15 HISTORY — DX: Pneumonia, unspecified organism: J18.9

## 2012-09-15 HISTORY — DX: Other specified postprocedural states: R11.2

## 2012-09-15 HISTORY — PX: AMPUTATION: SHX166

## 2012-09-15 HISTORY — DX: Other specified postprocedural states: Z98.890

## 2012-09-15 LAB — PROTIME-INR
INR: 1.06 (ref 0.00–1.49)
Prothrombin Time: 13.6 seconds (ref 11.6–15.2)

## 2012-09-15 LAB — CBC
HCT: 34.1 % — ABNORMAL LOW (ref 36.0–46.0)
Hemoglobin: 11.8 g/dL — ABNORMAL LOW (ref 12.0–15.0)
MCH: 29.2 pg (ref 26.0–34.0)
MCHC: 34.6 g/dL (ref 30.0–36.0)
RDW: 12.5 % (ref 11.5–15.5)

## 2012-09-15 LAB — COMPREHENSIVE METABOLIC PANEL
Albumin: 3.5 g/dL (ref 3.5–5.2)
BUN: 10 mg/dL (ref 6–23)
Calcium: 8.8 mg/dL (ref 8.4–10.5)
Creatinine, Ser: 1.19 mg/dL — ABNORMAL HIGH (ref 0.50–1.10)
GFR calc Af Amer: 62 mL/min — ABNORMAL LOW (ref >90)
Glucose, Bld: 98 mg/dL (ref 70–99)
Potassium: 3.8 mEq/L (ref 3.5–5.1)
Total Protein: 6.3 g/dL (ref 6.0–8.3)

## 2012-09-15 LAB — APTT: aPTT: 29 seconds (ref 24–37)

## 2012-09-15 SURGERY — AMPUTATION DIGIT
Anesthesia: General | Site: Foot | Laterality: Right | Wound class: Dirty or Infected

## 2012-09-15 MED ORDER — HYDROMORPHONE HCL PF 1 MG/ML IJ SOLN
INTRAMUSCULAR | Status: AC
  Filled 2012-09-15: qty 1

## 2012-09-15 MED ORDER — HYDROMORPHONE HCL PF 1 MG/ML IJ SOLN
0.2500 mg | INTRAMUSCULAR | Status: DC | PRN
  Administered 2012-09-15 (×6): 0.5 mg via INTRAVENOUS

## 2012-09-15 MED ORDER — HYDROMORPHONE HCL PF 1 MG/ML IJ SOLN
0.5000 mg | INTRAMUSCULAR | Status: DC | PRN
  Administered 2012-09-15 – 2012-09-16 (×3): 1 mg via INTRAVENOUS
  Filled 2012-09-15 (×3): qty 1

## 2012-09-15 MED ORDER — ONDANSETRON HCL 4 MG PO TABS
4.0000 mg | ORAL_TABLET | Freq: Four times a day (QID) | ORAL | Status: DC | PRN
  Administered 2012-09-16: 4 mg via ORAL
  Filled 2012-09-15: qty 1

## 2012-09-15 MED ORDER — PREGABALIN 50 MG PO CAPS
75.0000 mg | ORAL_CAPSULE | Freq: Every evening | ORAL | Status: DC
  Administered 2012-09-15: 75 mg via ORAL
  Filled 2012-09-15: qty 1

## 2012-09-15 MED ORDER — FENTANYL CITRATE 0.05 MG/ML IJ SOLN
INTRAMUSCULAR | Status: DC | PRN
  Administered 2012-09-15: 50 ug via INTRAVENOUS

## 2012-09-15 MED ORDER — CEFAZOLIN SODIUM-DEXTROSE 2-3 GM-% IV SOLR
INTRAVENOUS | Status: AC
  Administered 2012-09-15: 2 g via INTRAVENOUS
  Filled 2012-09-15: qty 50

## 2012-09-15 MED ORDER — CEFAZOLIN SODIUM-DEXTROSE 2-3 GM-% IV SOLR
2.0000 g | Freq: Four times a day (QID) | INTRAVENOUS | Status: AC
  Administered 2012-09-15 – 2012-09-16 (×3): 2 g via INTRAVENOUS
  Filled 2012-09-15 (×3): qty 50

## 2012-09-15 MED ORDER — METOCLOPRAMIDE HCL 5 MG/ML IJ SOLN
5.0000 mg | Freq: Three times a day (TID) | INTRAMUSCULAR | Status: DC | PRN
  Administered 2012-09-16: 10 mg via INTRAVENOUS
  Filled 2012-09-15: qty 2

## 2012-09-15 MED ORDER — 0.9 % SODIUM CHLORIDE (POUR BTL) OPTIME
TOPICAL | Status: DC | PRN
  Administered 2012-09-15: 1000 mL

## 2012-09-15 MED ORDER — ASPIRIN EC 325 MG PO TBEC
325.0000 mg | DELAYED_RELEASE_TABLET | Freq: Every day | ORAL | Status: DC
  Administered 2012-09-15 – 2012-09-16 (×2): 325 mg via ORAL
  Filled 2012-09-15 (×2): qty 1

## 2012-09-15 MED ORDER — LACTATED RINGERS IV SOLN: INTRAVENOUS | Status: DC

## 2012-09-15 MED ORDER — ZOLPIDEM TARTRATE 5 MG PO TABS: 5.0000 mg | ORAL_TABLET | Freq: Every evening | ORAL | Status: DC | PRN

## 2012-09-15 MED ORDER — ONDANSETRON HCL 4 MG/2ML IJ SOLN
INTRAMUSCULAR | Status: DC | PRN
  Administered 2012-09-15: 4 mg via INTRAVENOUS

## 2012-09-15 MED ORDER — MIDAZOLAM HCL 5 MG/5ML IJ SOLN
INTRAMUSCULAR | Status: DC | PRN
  Administered 2012-09-15 (×2): 1 mg via INTRAVENOUS

## 2012-09-15 MED ORDER — ZOLPIDEM TARTRATE 5 MG PO TABS: 10.0000 mg | ORAL_TABLET | Freq: Every evening | ORAL | Status: DC | PRN

## 2012-09-15 MED ORDER — ARTIFICIAL TEARS OP OINT
TOPICAL_OINTMENT | OPHTHALMIC | Status: DC | PRN
  Administered 2012-09-15: 1 via OPHTHALMIC

## 2012-09-15 MED ORDER — TRAZODONE HCL 150 MG PO TABS
400.0000 mg | ORAL_TABLET | Freq: Every day | ORAL | Status: DC
  Administered 2012-09-15: 400 mg via ORAL
  Filled 2012-09-15 (×2): qty 1

## 2012-09-15 MED ORDER — HYDROXYZINE PAMOATE 25 MG PO CAPS
25.0000 mg | ORAL_CAPSULE | Freq: Every day | ORAL | Status: DC
  Filled 2012-09-15: qty 1

## 2012-09-15 MED ORDER — METOCLOPRAMIDE HCL 10 MG PO TABS: 5.0000 mg | ORAL_TABLET | Freq: Three times a day (TID) | ORAL | Status: DC | PRN

## 2012-09-15 MED ORDER — LACTATED RINGERS IV SOLN
INTRAVENOUS | Status: DC | PRN
  Administered 2012-09-15: 12:00:00 via INTRAVENOUS

## 2012-09-15 MED ORDER — HYDROXYZINE HCL 25 MG PO TABS
25.0000 mg | ORAL_TABLET | Freq: Every day | ORAL | Status: DC
  Administered 2012-09-15: 25 mg via ORAL
  Filled 2012-09-15: qty 1

## 2012-09-15 MED ORDER — SODIUM CHLORIDE 0.9 % IV SOLN
INTRAVENOUS | Status: DC
  Administered 2012-09-15: 20 mL/h via INTRAVENOUS

## 2012-09-15 MED ORDER — LITHIUM CARBONATE ER 300 MG PO TBCR
300.0000 mg | EXTENDED_RELEASE_TABLET | Freq: Every evening | ORAL | Status: DC
  Administered 2012-09-15: 300 mg via ORAL
  Filled 2012-09-15 (×2): qty 1

## 2012-09-15 MED ORDER — ONDANSETRON HCL 4 MG/2ML IJ SOLN
4.0000 mg | Freq: Four times a day (QID) | INTRAMUSCULAR | Status: DC | PRN
  Administered 2012-09-15 – 2012-09-16 (×2): 4 mg via INTRAVENOUS
  Filled 2012-09-15 (×2): qty 2

## 2012-09-15 MED ORDER — QUETIAPINE FUMARATE 400 MG PO TABS
800.0000 mg | ORAL_TABLET | Freq: Every day | ORAL | Status: DC
  Administered 2012-09-15: 800 mg via ORAL
  Filled 2012-09-15 (×2): qty 2

## 2012-09-15 MED ORDER — OXYCODONE HCL 5 MG PO TABS
5.0000 mg | ORAL_TABLET | ORAL | Status: DC | PRN
  Administered 2012-09-16 (×3): 10 mg via ORAL
  Filled 2012-09-15: qty 2

## 2012-09-15 MED ORDER — LACTATED RINGERS IV SOLN
INTRAVENOUS | Status: DC
  Administered 2012-09-15: 11:00:00 via INTRAVENOUS

## 2012-09-15 MED ORDER — PROMETHAZINE HCL 25 MG PO TABS
25.0000 mg | ORAL_TABLET | Freq: Four times a day (QID) | ORAL | Status: DC | PRN
  Administered 2012-09-15: 25 mg via ORAL
  Filled 2012-09-15: qty 1

## 2012-09-15 MED ORDER — LIDOCAINE HCL (CARDIAC) 20 MG/ML IV SOLN
INTRAVENOUS | Status: DC | PRN
  Administered 2012-09-15: 80 mg via INTRAVENOUS

## 2012-09-15 MED ORDER — PROPOFOL 10 MG/ML IV BOLUS
INTRAVENOUS | Status: DC | PRN
  Administered 2012-09-15: 150 mg via INTRAVENOUS

## 2012-09-15 MED ORDER — OXYCODONE HCL 5 MG PO TABS
5.0000 mg | ORAL_TABLET | Freq: Four times a day (QID) | ORAL | Status: DC | PRN
  Filled 2012-09-15 (×2): qty 2

## 2012-09-15 SURGICAL SUPPLY — 31 items
BANDAGE GAUZE ELAST BULKY 4 IN (GAUZE/BANDAGES/DRESSINGS) ×2 IMPLANT
BNDG COHESIVE 4X5 TAN STRL (GAUZE/BANDAGES/DRESSINGS) ×2 IMPLANT
BNDG ESMARK 4X9 LF (GAUZE/BANDAGES/DRESSINGS) IMPLANT
CLOTH BEACON ORANGE TIMEOUT ST (SAFETY) ×2 IMPLANT
COVER SURGICAL LIGHT HANDLE (MISCELLANEOUS) ×2 IMPLANT
DRAPE U-SHAPE 47X51 STRL (DRAPES) ×2 IMPLANT
DRSG ADAPTIC 3X8 NADH LF (GAUZE/BANDAGES/DRESSINGS) ×2 IMPLANT
DRSG PAD ABDOMINAL 8X10 ST (GAUZE/BANDAGES/DRESSINGS) ×2 IMPLANT
DURAPREP 26ML APPLICATOR (WOUND CARE) ×2 IMPLANT
ELECT REM PT RETURN 9FT ADLT (ELECTROSURGICAL) ×2
ELECTRODE REM PT RTRN 9FT ADLT (ELECTROSURGICAL) ×1 IMPLANT
GLOVE BIOGEL PI IND STRL 9 (GLOVE) ×1 IMPLANT
GLOVE BIOGEL PI INDICATOR 9 (GLOVE) ×1
GLOVE SURG ORTHO 9.0 STRL STRW (GLOVE) ×2 IMPLANT
GOWN PREVENTION PLUS XLARGE (GOWN DISPOSABLE) ×2 IMPLANT
GOWN SRG XL XLNG 56XLVL 4 (GOWN DISPOSABLE) ×1 IMPLANT
GOWN STRL NON-REIN XL XLG LVL4 (GOWN DISPOSABLE) ×1
KIT BASIN OR (CUSTOM PROCEDURE TRAY) ×2 IMPLANT
KIT ROOM TURNOVER OR (KITS) ×2 IMPLANT
MANIFOLD NEPTUNE II (INSTRUMENTS) ×2 IMPLANT
NEEDLE 22X1 1/2 (OR ONLY) (NEEDLE) IMPLANT
NS IRRIG 1000ML POUR BTL (IV SOLUTION) ×2 IMPLANT
PACK ORTHO EXTREMITY (CUSTOM PROCEDURE TRAY) ×2 IMPLANT
PAD ARMBOARD 7.5X6 YLW CONV (MISCELLANEOUS) ×4 IMPLANT
SPONGE GAUZE 4X4 12PLY (GAUZE/BANDAGES/DRESSINGS) ×2 IMPLANT
SUCTION FRAZIER TIP 10 FR DISP (SUCTIONS) IMPLANT
SUT ETHILON 2 0 PSLX (SUTURE) ×2 IMPLANT
SYR CONTROL 10ML LL (SYRINGE) IMPLANT
TOWEL OR 17X24 6PK STRL BLUE (TOWEL DISPOSABLE) ×2 IMPLANT
TOWEL OR 17X26 10 PK STRL BLUE (TOWEL DISPOSABLE) ×2 IMPLANT
TUBE CONNECTING 12X1/4 (SUCTIONS) IMPLANT

## 2012-09-15 NOTE — Preoperative (Signed)
Beta Blockers   Reason not to administer Beta Blockers:Not Applicable 

## 2012-09-15 NOTE — Anesthesia Procedure Notes (Signed)
Procedure Name: LMA Insertion Date/Time: 09/15/2012 12:30 PM Performed by: Gayla Medicus Pre-anesthesia Checklist: Patient identified, Timeout performed, Emergency Drugs available, Suction available and Patient being monitored Patient Re-evaluated:Patient Re-evaluated prior to inductionOxygen Delivery Method: Circle system utilized Preoxygenation: Pre-oxygenation with 100% oxygen Intubation Type: IV induction LMA: LMA inserted LMA Size: 4.0 Number of attempts: 1 Placement Confirmation: positive ETCO2 and breath sounds checked- equal and bilateral Tube secured with: Tape Dental Injury: Teeth and Oropharynx as per pre-operative assessment

## 2012-09-15 NOTE — Transfer of Care (Signed)
Immediate Anesthesia Transfer of Care Note  Patient: [name]  Procedure(s) Performed: Procedure(s) with comments: AMPUTATION DIGIT- right  (Right) - Right Great Toe Amputation at MTP (metatarsophalangeal)  Patient Location: PACU  Anesthesia Type:General  Level of Consciousness: awake, alert , oriented and patient cooperative  Airway & Oxygen Therapy: Patient Spontanous Breathing and Patient connected to nasal cannula oxygen  Post-op Assessment: Report given to PACU RN, Post -op Vital signs reviewed and stable and Patient moving all extremities  Post vital signs: Reviewed and stable  Complications: No apparent anesthesia complications

## 2012-09-15 NOTE — Anesthesia Postprocedure Evaluation (Signed)
  Anesthesia Post-op Note  Patient: [name]  Procedure(s) Performed: Procedure(s) with comments: AMPUTATION DIGIT- right  (Right) - Right Great Toe Amputation at MTP (metatarsophalangeal)  Patient Location: PACU  Anesthesia Type:General  Level of Consciousness: awake  Airway and Oxygen Therapy: Patient Spontanous Breathing  Post-op Pain: mild  Post-op Assessment: Post-op Vital signs reviewed  Post-op Vital Signs: Reviewed  Complications: No apparent anesthesia complications

## 2012-09-15 NOTE — Progress Notes (Signed)
Orthopedic Tech Progress Note Patient Details:  [name] 1965/11/15 [id number]  Ortho Devices Type of Ortho Device: Postop shoe/boot Ortho Device/Splint Interventions: Application   Cammer, Mickie Bail 09/15/2012, 1:42 PM

## 2012-09-15 NOTE — H&P (Signed)
[name] is an 47 y.o. female.   Chief Complaint: Osteomyelitis ulceration right foot great toe HPI: Patient is a 47 year old woman who is status post paronychial infection. After prolonged conservative therapy patient has developed osteomyelitis of the great toe and presents at this time for amputation of the great toe at the MTP joint. Patient failed prolonged conservative therapy including wound care and antibiotic treatment.  Past Medical History  Diagnosis Date  . Dysrhythmia     Left sided heart diease  . Mental disorder   . Bipolar affective disorder, mixed, in partial or remission   . Headache(784.0)   . Anxiety   . Renal disorder   . Kidney stones   . Depression   . 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 History of gallstones   . PONV (postoperative nausea and vomiting)   . Pneumonia     Hx: of as a child  . Sleep apnea     Past Surgical History  Procedure Laterality Date  . Vaginal hysterectomy    . Cholecystectomy    . Total abdominal hysterectomy w/ bilateral salpingoophorectomy    . Foot surgery      right x 2  . Colonoscopy      2001, 2008.  Father has colon cancer  . Bunionectomy      11/14/2011  . Abdominal hysterectomy      Family History  Problem Relation Age of Onset  . Colon cancer Father 13  . Stomach cancer Neg Hx   . Rectal cancer Neg Hx   . Esophageal cancer Neg Hx   . Breast cancer Mother   . Breast cancer Father   . Uterine cancer Mother    Social History:  reports that she has never smoked. She has never used smokeless tobacco. She reports that she does not drink alcohol or use illicit drugs.  Allergies: No Known Allergies  No prescriptions prior to admission    No results found for this or any previous visit (from the past 48 hour(s)). No results found.  Review of Systems  All other systems reviewed and are negative.    Height 5\' 6"  (1.676 m), weight 81.647 kg (180 lb). Physical Exam  On examination  patient has palpable pulses. She is cellulitis swelling ulceration and destructive bony changes of the right great toe. Assessment/Plan Assessment: Ulceration osteomyelitis right great toe.  Plan: Will plan for amputation of the great toe at the MTP joint. Risks and benefits were discussed including persistent infection nonhealing of the wound potential for higher level amputation. Patient states she understands and wished to proceed at this time.  Greg Cratty V 09/15/2012, 6:39 AM

## 2012-09-15 NOTE — Op Note (Signed)
OPERATIVE REPORT  DATE OF SURGERY: 09/15/2012  PATIENT:  [name],  47 y.o. female  PRE-OPERATIVE DIAGNOSIS:  osteomyelitis right great toe  POST-OPERATIVE DIAGNOSIS:  osteomyelitis right great toe  PROCEDURE:  Procedure(s): AMPUTATION DIGIT- right   SURGEON:  Surgeon(s): Nadara Mustard, MD  ANESTHESIA:   general  EBL:  min ML  SPECIMEN:  No Specimen  TOURNIQUET:  * No tourniquets in log *  PROCEDURE DETAILS: Patient is a 47 year old woman who presents with osteomyelitis of her right great toe. She is undergone conservative treatment with IV antibiotics by mouth antibiotics wound care and despite conservative measures patient has destructive osteomyelitis of the great toe and presents at this time for amputation of the great toe. Risks and benefits were discussed patient states she understands and wishes to proceed at this time. Description of procedure patient was brought to the operating room and underwent a general anesthetic. After adequate levels of anesthesia were obtained patient's right lower extremity was prepped using DuraPrep draped into a sterile field. A fishmouth incision was made just distal to the MTP joint. The toe was amputated through the MTP joint. Hemostasis was obtained was irrigated with normal saline the incision was closed using 2-0 nylon. The wound was covered Adaptic orthopedic sponges AB dressing Kerlix and Coban. Patient was extubated taken to the PACU in stable condition.  PLAN OF CARE: Admit for overnight observation  PATIENT DISPOSITION:  PACU - hemodynamically stable.   Nadara Mustard, MD 09/15/2012 12:45 PM

## 2012-09-15 NOTE — Anesthesia Preprocedure Evaluation (Addendum)
Anesthesia Evaluation  Patient identified by MRN, date of birth, ID band Patient awake    Reviewed: Allergy & Precautions, H&P , NPO status , Patient's Chart, lab work & pertinent test results  History of Anesthesia Complications (+) PONV  Airway Mallampati: II      Dental   Pulmonary sleep apnea and Continuous Positive Airway Pressure Ventilation , pneumonia -,  breath sounds clear to auscultation        Cardiovascular Rhythm:Regular Rate:Normal     Neuro/Psych  Headaches, Anxiety Depression    GI/Hepatic negative GI ROS, Neg liver ROS,   Endo/Other  negative endocrine ROS  Renal/GU Renal disease     Musculoskeletal   Abdominal   Peds  Hematology   Anesthesia Other Findings   Reproductive/Obstetrics                          Anesthesia Physical Anesthesia Plan  ASA: III  Anesthesia Plan:    Post-op Pain Management:    Induction: Intravenous  Airway Management Planned: LMA  Additional Equipment:   Intra-op Plan:   Post-operative Plan:   Informed Consent: I have reviewed the patients History and Physical, chart, labs and discussed the procedure including the risks, benefits and alternatives for the proposed anesthesia with the patient or authorized representative who has indicated his/her understanding and acceptance.   Dental advisory given  Plan Discussed with: CRNA, Anesthesiologist and Surgeon  Anesthesia Plan Comments:         Anesthesia Quick Evaluation

## 2012-09-16 ENCOUNTER — Encounter (HOSPITAL_COMMUNITY): Payer: Self-pay | Admitting: Orthopedic Surgery

## 2012-09-16 LAB — GLUCOSE, CAPILLARY

## 2012-09-16 MED ORDER — OXYCODONE-ACETAMINOPHEN 5-325 MG PO TABS: 1.0000 | ORAL_TABLET | ORAL | Status: DC | PRN

## 2012-09-16 NOTE — Evaluation (Signed)
Physical Therapy Evaluation Patient Details Name: [name] MRN: [id number] DOB: Jun 15, 1965 Today's Date: 09/16/2012 Time: 0932-1000 PT Time Calculation (min): 28 min  PT Assessment / Plan / Recommendation History of Present Illness  right foot great toe amputation  Clinical Impression  Pt mobilizing well and educated for weight bearing status, use of DME and safety with all mobility. Pt able to perform basic transfers, gait and stairs without assistance and all education provided. Pt will need RW for home with stair handout provided. No further therapy needs at this time with pt in agreement. Recommend continued mobility with nursing assist.     PT Assessment  Patent does not need any further PT services    Follow Up Recommendations  No PT follow up    Does the patient have the potential to tolerate intense rehabilitation      Barriers to Discharge        Equipment Recommendations  Rolling walker with 5" wheels    Recommendations for Other Services     Frequency      Precautions / Restrictions Precautions Precautions: Fall Restrictions Weight Bearing Restrictions: Yes RLE Weight Bearing: Partial weight bearing RLE Partial Weight Bearing Percentage or Pounds: weight bear on heel only   Pertinent Vitals/Pain 6/10 premedicated and repositioned      Mobility  Bed Mobility Bed Mobility: Supine to Sit Supine to Sit: 6: Modified independent (Device/Increase time);With rails;HOB flat Transfers Transfers: Sit to Stand;Stand to Sit Sit to Stand: 5: Supervision;From bed;From toilet Stand to Sit: 5: Supervision;To toilet;To chair/3-in-1 Details for Transfer Assistance: cueing for hand placement and safety, pt with tendency to not reach for surface for descent Ambulation/Gait Ambulation/Gait Assistance: 6: Modified independent (Device/Increase time) Ambulation Distance (Feet): 250 Feet Assistive device: Rolling walker Ambulation/Gait Assistance Details: initial  instruction for sequence and posture and then pt able to perform on her own Gait Pattern: Step-to pattern Gait velocity: decreased Stairs: Yes Stairs Assistance: 4: Min assist Stairs Assistance Details (indicate cue type and reason): educated for and performed RW sequence. Pt states she will plan to scoot up full flight to bedroom Stair Management Technique: Backwards;With walker Number of Stairs: 4    Exercises     PT Diagnosis:    PT Problem List:   PT Treatment Interventions:       PT Goals(Current goals can be found in the care plan section) Acute Rehab PT Goals PT Goal Formulation: No goals set, d/c therapy  Visit Information  Last PT Received On: 09/16/12 Assistance Needed: +1 History of Present Illness: right foot great toe amputation       Prior Functioning  Home Living Family/patient expects to be discharged to:: Private residence Living Arrangements: Spouse/significant other Available Help at Discharge: Family;Available 24 hours/day Type of Home: House Home Access: Stairs to enter [employer] of Steps: 3 Entrance Stairs-Rails: None Home Layout: Two level;Bed/bath upstairs Alternate Level Stairs-Number of Steps: 12 Alternate Level Stairs-Rails: Right Home Equipment: None Prior Function Level of Independence: Independent 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 Communication Communication: No difficulties    Cognition  Cognition Arousal/Alertness: Awake/alert Behavior During Therapy: WFL for tasks assessed/performed Overall Cognitive Status: Within Functional Limits for tasks assessed    Extremity/Trunk Assessment Upper Extremity Assessment Upper Extremity Assessment: Overall WFL for tasks assessed Lower Extremity Assessment Lower Extremity Assessment: Overall WFL for tasks assessed Cervical / Trunk Assessment Cervical / Trunk Assessment: Normal   Balance    End of Session PT - End of Session Equipment Utilized During Treatment:  Gait belt;Other  (comment) (darco shoe) Activity Tolerance: Patient tolerated treatment well Patient left: in chair;with call bell/phone within reach Nurse Communication: Mobility status  GP Functional Assessment Tool Used: clinical judgement Functional Limitation: Mobility: Walking and moving around Mobility: Walking and Moving Around Current Status (N5621): At least 1 percent but less than 20 percent impaired, limited or restricted Mobility: Walking and Moving Around Goal Status [phone]): At least 1 percent but less than 20 percent impaired, limited or restricted Mobility: Walking and Moving Around Discharge Status [phone]): At least 1 percent but less than 20 percent impaired, limited or restricted   Delorse Lek 09/16/2012, 10:48 AM Delaney Meigs, PT [phone]

## 2012-09-16 NOTE — Progress Notes (Signed)
Pt discharged to home accompanied by family. Discharge instructions and rx given and explained and pt stated understanding. Pts IV was removed. Pt left unit in a stable condition via wheelchair. 

## 2012-09-16 NOTE — Discharge Summary (Signed)
Physician Discharge Summary  Patient ID: [name] MRN: [id number] DOB/AGE: 46-Sep-1967 46 y.o.  Admit date: 09/15/2012 Discharge date: 09/16/2012  Admission Diagnoses: Osteomyelitis abscess right foot great toe  Discharge Diagnoses: Same Active Problems:   * No active hospital problems. *   Discharged Condition: stable  Hospital Course: Patient's hospital course was essentially unremarkable. She underwent great toe amputation right foot. Postoperatively patient was kept overnight for IV antibiotics and pain control.  Consults: None  Significant Diagnostic Studies: labs: Routine labs  Treatments: surgery: See operative note  Discharge Exam: Blood pressure 109/72, pulse 90, temperature 98 F (36.7 C), temperature source Oral, resp. rate 18, height 5\' 6"  (1.676 m), weight 81.647 kg (180 lb), SpO2 100.00%. Incision/Wound: dressing clean dry and intact  Disposition: 01-Home or Self Care  Discharge Orders   Future Orders Complete By Expires     Ambulatory referral to Home Health  As directed     Comments:      Please evaluate Merdis Delay for admission to [facility].  Disciplines requested: Physical Therapy  Services to provide: Strengthening Exercises  Physician to follow patient's care (the person listed here will be responsible for signing ongoing orders): Referring Provider  Requested Start of Care Date: Within 2-3 days  Special Instructions:  Progressive ambulation with therapy. Patient will need crutches and a walker.    Call MD / Call 911  As directed     Comments:      If you experience chest pain or shortness of breath, CALL 911 and be transported to the hospital emergency room.  If you develope a fever above 101 F, pus (white drainage) or increased drainage or redness at the wound, or calf pain, call your surgeon's office.    Constipation Prevention  As directed     Comments:      Drink plenty of fluids.  Prune juice may be helpful.  You may use a stool  softener, such as Colace (over the counter) 100 mg twice a day.  Use MiraLax (over the counter) for constipation as needed.    Diet - low sodium heart healthy  As directed     For home use only DME Crutches  As directed     Increase activity slowly as tolerated  As directed         Medication List         AMBIEN 10 MG tablet  Generic drug:  zolpidem  Take 10 mg by mouth at bedtime as needed for sleep. For sleep     ciprofloxacin 750 MG tablet  Commonly known as:  CIPRO  Take 750 mg by mouth 2 (two) times daily.     hydrOXYzine 25 MG capsule  Commonly known as:  VISTARIL  Take 25 mg by mouth at bedtime.     lithium carbonate 300 MG CR tablet  Commonly known as:  LITHOBID  Take 300 mg by mouth every evening.     mupirocin cream 2 %  Commonly known as:  BACTROBAN  Apply topically daily. Please apply to left great toe as prescribed  daily     oxyCODONE 5 MG immediate release tablet  Commonly known as:  Oxy IR/ROXICODONE  Take 5-10 mg by mouth every 6 (six) hours as needed for pain.     oxyCODONE-acetaminophen 5-325 MG per tablet  Commonly known as:  ROXICET  Take 1 tablet by mouth every 4 (four) hours as needed for pain.     pregabalin 75 MG capsule  Commonly known as:  LYRICA  Take 75 mg by mouth every evening.     promethazine 25 MG tablet  Commonly known as:  PHENERGAN  Take 1 tablet (25 mg total) by mouth every 6 (six) hours as needed for nausea.     QUEtiapine 400 MG tablet  Commonly known as:  SEROQUEL  Take 800 mg by mouth at bedtime.     traZODone 100 MG tablet  Commonly known as:  DESYREL  Take 400 mg by mouth at bedtime.           Follow-up Information   Follow up with DUDA,MARCUS V, MD In 2 weeks.   Contact information:   [address] [place] [address] [phone]       Signed: Nadara Mustard 09/16/2012, 7:00 AM

## 2012-09-16 NOTE — Progress Notes (Signed)
UR COMPLETED  

## 2012-09-29 ENCOUNTER — Telehealth: Payer: Self-pay | Admitting: Internal Medicine

## 2012-09-29 NOTE — Telephone Encounter (Signed)
Yes you can

## 2012-09-29 NOTE — Telephone Encounter (Signed)
Needs a hep b and a tetnus shot.  She is going to nursing school.  Can I schedule her on the inj schedule.

## 2012-10-05 NOTE — Telephone Encounter (Signed)
Patient called back and scheduled inj for nurse visit for tomorrow afternoon.

## 2012-10-06 ENCOUNTER — Ambulatory Visit: Payer: Medicare Other

## 2012-10-11 ENCOUNTER — Ambulatory Visit: Payer: Medicare Other

## 2012-10-12 ENCOUNTER — Ambulatory Visit (INDEPENDENT_AMBULATORY_CARE_PROVIDER_SITE_OTHER): Payer: Medicare Other | Admitting: Physician Assistant

## 2012-10-12 ENCOUNTER — Encounter: Payer: Self-pay | Admitting: Physician Assistant

## 2012-10-12 VITALS — BP 124/82 | HR 91 | Temp 98.6°F | Resp 18 | Wt 188.2 lb

## 2012-10-12 DIAGNOSIS — Z23 Encounter for immunization: Secondary | ICD-10-CM

## 2012-10-12 DIAGNOSIS — R599 Enlarged lymph nodes, unspecified: Secondary | ICD-10-CM

## 2012-10-12 DIAGNOSIS — R590 Localized enlarged lymph nodes: Secondary | ICD-10-CM | POA: Insufficient documentation

## 2012-10-12 DIAGNOSIS — G44209 Tension-type headache, unspecified, not intractable: Secondary | ICD-10-CM

## 2012-10-12 DIAGNOSIS — Z111 Encounter for screening for respiratory tuberculosis: Secondary | ICD-10-CM

## 2012-10-12 LAB — CBC WITH DIFFERENTIAL/PLATELET
Basophils Absolute: 0 10*3/uL (ref 0.0–0.1)
Eosinophils Relative: 0 % (ref 0–5)
HCT: 39.2 % (ref 36.0–46.0)
Hemoglobin: 13.2 g/dL (ref 12.0–15.0)
Lymphocytes Relative: 29 % (ref 12–46)
Lymphs Abs: 1.4 10*3/uL (ref 0.7–4.0)
MCV: 84.1 fL (ref 78.0–100.0)
Monocytes Absolute: 0.2 10*3/uL (ref 0.1–1.0)
Monocytes Relative: 4 % (ref 3–12)
Neutro Abs: 3.3 10*3/uL (ref 1.7–7.7)
RBC: 4.66 MIL/uL (ref 3.87–5.11)
WBC: 5 10*3/uL (ref 4.0–10.5)

## 2012-10-12 LAB — BASIC METABOLIC PANEL
BUN: 13 mg/dL (ref 6–23)
CO2: 27 mEq/L (ref 19–32)
Chloride: 102 mEq/L (ref 96–112)
Potassium: 4.3 mEq/L (ref 3.5–5.3)

## 2012-10-12 MED ORDER — METHOCARBAMOL 500 MG PO TABS: ORAL_TABLET | ORAL | Status: DC

## 2012-10-12 NOTE — Assessment & Plan Note (Signed)
Patient advised to use OTC NSAIDs.  Methocarbamol prescription given for muscle spasm.  Encouraged moist heat to affected area daily with use of topical Aspercreme or Salonpas.  Patient to return to clinic if headaches persist.  If headache becomes severe or there is a change in function, please go to the ED.

## 2012-10-12 NOTE — Progress Notes (Signed)
Patient ID: [name], female   DOB: 1965-04-02, 47 y.o.   MRN: [id number]   Patient presents to clinic today c/o nighttime headaches that have been occuring a few times a week for the past month.  Patient states that the headaches feel like "sores on her head".  Headache is localized to the posterior of head.  Denies nausea, vomiting, photophobia, phonophobia.  Denies neurological impairments.  Headache lasts a couple of hours and is relieved by rest.  Is not affecting sleep and does not wake her from sleep.  Has not tried anything OTC for symptoms.  Has history of migraines for which she takes Imitrex.  Denies history of HTN , HLD, or vascular disease.  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  Patient recently has toe amputated secondary to osteomyelitis.  Is taking oxycodone for pain relief but denies excessive use of pain medication.  Patient requesting TDaP and PPD skin test today as they are required by her nursing program.  Past Medical History  Diagnosis Date  . Dysrhythmia     Left sided heart diease  . Mental disorder   . Bipolar affective disorder, mixed, in partial or remission   . Headache(784.0)   . Anxiety   . Depression   . 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 History of gallstones   . PONV (postoperative nausea and vomiting)   . Sleep apnea   . Renal disorder   . Kidney stones   . Pneumonia     Hx: of as a child   No Known Allergies Current Outpatient Prescriptions on File Prior to Visit  Medication Sig Dispense Refill  . hydrOXYzine (VISTARIL) 25 MG capsule Take 25 mg by mouth at bedtime.      Marland Kitchen lithium carbonate (LITHOBID) 300 MG CR tablet Take 300 mg by mouth every evening.       . mupirocin cream (BACTROBAN) 2 % Apply topically daily. Please apply to left great toe as prescribed  daily  15 g  0  . oxyCODONE (OXY IR/ROXICODONE) 5 MG immediate release tablet Take 5-10 mg by mouth every 6 (six)  hours as needed for pain.       Marland Kitchen oxyCODONE-acetaminophen (ROXICET) 5-325 MG per tablet Take 1 tablet by mouth every 4 (four) hours as needed for pain.  60 tablet  0  . pregabalin (LYRICA) 75 MG capsule Take 75 mg by mouth every evening.       . promethazine (PHENERGAN) 25 MG tablet Take 1 tablet (25 mg total) by mouth every 6 (six) hours as needed for nausea.  60 tablet  0  . QUEtiapine (SEROQUEL) 400 MG tablet Take 800 mg by mouth at bedtime.       . traZODone (DESYREL) 100 MG tablet Take 400 mg by mouth at bedtime.      Marland Kitchen zolpidem (AMBIEN) 10 MG tablet Take 10 mg by mouth at bedtime as needed for sleep. For sleep       No current facility-administered medications on file prior to visit.   Family History  Problem Relation Age of Onset  . Colon cancer Father 109  . Stomach cancer Neg Hx   . Rectal cancer Neg Hx   . Esophageal cancer Neg Hx   . Breast cancer Mother   . Breast cancer Father   . Uterine cancer Mother    History   Social History  . Marital Status:  Married    Spouse Name: N/A    Number of Children: 6  . Years of Education: N/A   Occupational History  . nursing student    Social History Main Topics  . Smoking status: Never Smoker   . Smokeless tobacco: Never Used  . Alcohol Use: No  . Drug Use: No     Comment: see triage note  . Sexually Active: No   Other Topics Concern  . 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  HENT: Negative for hearing loss, ear pain and tinnitus.   Eyes: Negative for blurred vision, double vision, photophobia and pain.  Respiratory: Negative for shortness of breath  and wheezing.   Cardiovascular: Negative for chest pain and palpitations.  Neurological: Positive for headaches. Negative for dizziness, tingling, sensory change, focal weakness, loss of consciousness and weakness.   Physical Exam  Constitutional: She is oriented to person, place, and time and well-developed, well-nourished, and in no distress. No distress.  HENT:  Head: Normocephalic and atraumatic.  Right Ear: External ear normal.  Left Ear: External ear normal.  Mouth/Throat: Oropharynx is clear and moist. No oropharyngeal exudate.  Eyes: Conjunctivae and EOM are normal. Pupils are equal, round, and reactive to light.  Right eye with lateral stabismus.  Neck: Normal range of motion. Neck supple. No thyromegaly present.  Cardiovascular: Normal rate, regular rhythm and normal heart sounds.   Pulmonary/Chest: Effort normal and breath sounds normal. No respiratory distress. She has no wheezes.  Musculoskeletal: Normal range of motion.  Muscle spasm noted in trapezius muscles.  Lymphadenopathy:    She has cervical adenopathy.  Neurological: She is alert and oriented to person, place, and time. She has normal reflexes. No cranial nerve deficit. Coordination normal.  Skin: Skin is warm and dry. No rash noted. She is not diaphoretic.   Assessment/Plan: (1) Tension Headache: Patient advised to use OTC NSAIDs.  Methocarbamol prescription given for muscle spasm.  Encouraged moist heat to affected area daily with use of topical Aspercreme or Salonpas.  Patient to return to clinic if headaches persist.  If headache becomes severe or there is a change in function, please go to the ED. (2) PPD Skin test placed by RN staff. (3) Tdap vaccine. (4) Cervical lymphadenopathy -- most likely 2/2 osteomyelitis infection 3 weeks ago.  Will obtain CBC to rule/out current infection.

## 2012-10-12 NOTE — Assessment & Plan Note (Signed)
most likely 2/2 osteomyelitis infection 3 weeks ago.  Will obtain CBC to rule/out current infection.

## 2012-10-12 NOTE — Patient Instructions (Signed)
Apply moist head to back of neck and head each evening to help relieve symptoms.  Can apply OTC Asprecreme of Salonpas to affected area.  Take Methacarbomal 1 tablet every evening to help with symptoms.  Do not take more often than needed.  RTC If symptoms acutely worsen.   Tension Headache A tension headache is pain, pressure, or aching felt over the front and sides of the head. Tension headaches often come after stress, feeling worried (anxiety), or feeling sad or down for a while (depressed). HOME CARE  Only take medicine as told by your doctor.  Lie down in a dark, quiet room when you have a headache.  Keep a journal to find out if certain things bring on headaches. For example, write down:  What you eat and drink.  How much sleep you get.  Any change to your diet or medicines.  Relax by getting a massage or doing other relaxing activities.  Put ice or heat packs on the head and neck area as told by your doctor.  Lessen stress.  Sit up straight. Do not tighten (tense) your muscles.  Quit smoking if you smoke.  Lessen how much alcohol you drink.  Lessen how much caffeine you drink, or stop drinking caffeine.  Eat and exercise regularly.  Get enough sleep.  Avoid using too much pain medicine. GET HELP RIGHT AWAY IF:   Your headache becomes really bad.  You have a fever.  You have a stiff neck.  You have trouble seeing.  Your muscles are weak, or you lose muscle control.  You lose your balance or have trouble walking.  You feel like you will pass out (faint), or you pass out.  You have really bad symptoms that are different than your first symptoms.  You have problems with the medicines given to you by your doctor.  Your medicines do not work.  Your headache feels different than the other headaches.  You feel sick to your stomach (nauseous) or throw up (vomit). MAKE SURE YOU:   Understand these instructions.  Will watch your condition.  Will get  help right away if you are not doing well or get worse. Document Released: 05/28/2009 Document Revised: 05/26/2011 Document Reviewed: 02/21/2011 [facility] Patient Information 2014 [place], [place].

## 2012-10-14 ENCOUNTER — Emergency Department (HOSPITAL_BASED_OUTPATIENT_CLINIC_OR_DEPARTMENT_OTHER)
Admission: EM | Admit: 2012-10-14 | Discharge: 2012-10-15 | Disposition: A | Discharge status: Home / Self Care | Payer: Medicare Other | Attending: Emergency Medicine | Admitting: Emergency Medicine

## 2012-10-14 ENCOUNTER — Encounter (HOSPITAL_BASED_OUTPATIENT_CLINIC_OR_DEPARTMENT_OTHER): Payer: Self-pay | Admitting: Emergency Medicine

## 2012-10-14 DIAGNOSIS — F411 Generalized anxiety disorder: Secondary | ICD-10-CM | POA: Insufficient documentation

## 2012-10-14 DIAGNOSIS — Z8701 Personal history of pneumonia (recurrent): Secondary | ICD-10-CM | POA: Diagnosis not present

## 2012-10-14 DIAGNOSIS — Z87442 Personal history of urinary calculi: Secondary | ICD-10-CM | POA: Insufficient documentation

## 2012-10-14 DIAGNOSIS — F3177 Bipolar disorder, in partial remission, most recent episode mixed: Secondary | ICD-10-CM | POA: Diagnosis not present

## 2012-10-14 DIAGNOSIS — Z8719 Personal history of other diseases of the digestive system: Secondary | ICD-10-CM | POA: Insufficient documentation

## 2012-10-14 DIAGNOSIS — R51 Headache: Secondary | ICD-10-CM | POA: Diagnosis not present

## 2012-10-14 DIAGNOSIS — Z8679 Personal history of other diseases of the circulatory system: Secondary | ICD-10-CM | POA: Insufficient documentation

## 2012-10-14 DIAGNOSIS — Z8659 Personal history of other mental and behavioral disorders: Secondary | ICD-10-CM | POA: Insufficient documentation

## 2012-10-14 DIAGNOSIS — Z87440 Personal history of urinary (tract) infections: Secondary | ICD-10-CM | POA: Insufficient documentation

## 2012-10-14 DIAGNOSIS — G43909 Migraine, unspecified, not intractable, without status migrainosus: Secondary | ICD-10-CM | POA: Diagnosis present

## 2012-10-14 DIAGNOSIS — Z79899 Other long term (current) drug therapy: Secondary | ICD-10-CM | POA: Insufficient documentation

## 2012-10-14 MED ORDER — PROMETHAZINE HCL 25 MG/ML IJ SOLN
25.0000 mg | Freq: Once | INTRAMUSCULAR | Status: AC
  Administered 2012-10-15: 25 mg via INTRAMUSCULAR
  Filled 2012-10-14: qty 1

## 2012-10-14 MED ORDER — HYDROMORPHONE HCL PF 1 MG/ML IJ SOLN
1.0000 mg | Freq: Once | INTRAMUSCULAR | Status: AC
  Administered 2012-10-15: 1 mg via INTRAVENOUS
  Filled 2012-10-14: qty 1

## 2012-10-14 MED ORDER — SODIUM CHLORIDE 0.9 % IV SOLN
1000.0000 mL | Freq: Once | INTRAVENOUS | Status: AC
  Administered 2012-10-15: 1000 mL via INTRAVENOUS

## 2012-10-14 MED ORDER — SODIUM CHLORIDE 0.9 % IV SOLN: 1000.0000 mL | INTRAVENOUS | Status: DC

## 2012-10-14 NOTE — ED Notes (Signed)
States has a migraine headache  With vomiting onset 1300 .  Hx same

## 2012-10-14 NOTE — ED Provider Notes (Signed)
CSN: [id number]     Arrival date & time 10/14/12  2302 History     First MD Initiated Contact with Patient 10/14/12 2327     Chief Complaint  Patient presents with  . Migraine   (Consider location/radiation/quality/duration/timing/severity/associated sxs/prior Treatment) Patient is a 47 y.o. female presenting with migraines. The history is provided by the patient.  Migraine  She had onset about 10 hours ago of a severe headache. Pain is sharp and located in the right retro-orbital area without radiation. She rates the pain at 8/10. Nothing makes it better nothing makes it worse. She denies any visual change. She has noted nausea and vomiting. She denies any weakness or numbness or tingling. She took a dose of oxycodone with no relief. She has had similar headaches in the past and usually gets hydromorphone and promethazine.  Past Medical History  Diagnosis Date  . Dysrhythmia     Left sided heart diease  . Mental disorder   . Bipolar affective disorder, mixed, in partial or remission   . Headache(784.0)   . Anxiety   . Depression   . 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 History of gallstones   . PONV (postoperative nausea and vomiting)   . Sleep apnea   . Renal disorder   . Kidney stones   . Pneumonia     Hx: of as a child   Past Surgical History  Procedure Laterality Date  . Vaginal hysterectomy    . Cholecystectomy    . Total abdominal hysterectomy w/ bilateral salpingoophorectomy    . Foot surgery      right x 2  . Colonoscopy      2001, 2008.  Father has colon cancer  . Bunionectomy      11/14/2011  . Abdominal hysterectomy    . 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 Colon cancer Father 81  . Stomach cancer Neg Hx   .  Rectal cancer Neg Hx   . Esophageal cancer Neg Hx   . Breast cancer Mother   . Breast cancer Father   . Uterine cancer Mother    History  Substance Use Topics  . Smoking status: Never Smoker   . Smokeless tobacco: Never Used  . Alcohol Use: No   OB History   Grav Para Term Preterm Abortions TAB SAB Ect Mult Living                 Review of Systems  All other systems reviewed and are negative.    Allergies  Review of patient's allergies indicates no known allergies.  Home Medications   Current Outpatient Rx  Name  Route  Sig  Dispense  Refill  . hydrOXYzine (VISTARIL) 25 MG capsule   Oral   Take 25 mg by mouth at bedtime.         Marland Kitchen lithium carbonate (LITHOBID) 300 MG CR tablet   Oral   Take 300 mg by mouth every evening.          . methocarbamol (ROBAXIN) 500 MG tablet      Take 1 tablet as needed for pain and muscle spasm.  No more than three tablets per day.   30 tablet   0   .  mupirocin cream (BACTROBAN) 2 %   Topical   Apply topically daily. Please apply to left great toe as prescribed  daily   15 g   0   . oxyCODONE (OXY IR/ROXICODONE) 5 MG immediate release tablet   Oral   Take 5-10 mg by mouth every 6 (six) hours as needed for pain.          Marland Kitchen oxyCODONE-acetaminophen (ROXICET) 5-325 MG per tablet   Oral   Take 1 tablet by mouth every 4 (four) hours as needed for pain.   60 tablet   0   . pregabalin (LYRICA) 75 MG capsule   Oral   Take 75 mg by mouth every evening.          . promethazine (PHENERGAN) 25 MG tablet   Oral   Take 1 tablet (25 mg total) by mouth every 6 (six) hours as needed for nausea.   60 tablet   0   . QUEtiapine (SEROQUEL) 400 MG tablet   Oral   Take 800 mg by mouth at bedtime.          . traZODone (DESYREL) 100 MG tablet   Oral   Take 400 mg by mouth at bedtime.         Marland Kitchen zolpidem (AMBIEN) 10 MG tablet   Oral   Take 10 mg by mouth at bedtime as needed for sleep. For sleep          BP 122/80  Pulse  92  Temp(Src) 97.8 F (36.6 C) (Oral)  Resp 20  SpO2 100% Physical Exam  Nursing note and vitals reviewed.  47 year old female, resting comfortably and in no acute distress. Vital signs are normal. Oxygen saturation is 100%, which is normal. Head is normocephalic and atraumatic. PERRLA, EOMI. Oropharynx is clear. Fundi show no hemorrhage, exudate, or papilledema. Neck is nontender and supple without adenopathy or JVD. Back is nontender and there is no CVA tenderness. Lungs are clear without rales, wheezes, or rhonchi. Chest is nontender. Heart has regular rate and rhythm without murmur. Abdomen is soft, flat, nontender without masses or hepatosplenomegaly and peristalsis is normoactive. Extremities have no cyanosis or edema, full range of motion is present. Skin is warm and dry without rash. Neurologic: Mental status is normal, cranial nerves are intact, there are no motor or sensory deficits.  ED Course   Procedures (including critical care time)  Labs Reviewed - No data to display No results found. No diagnosis found.  MDM  Headache of uncertain cause. Old records are reviewed and she has several prior ED visits for headaches that have been treated with hydromorphone and promethazine. She's given IV fluids, IV hydromorphone, and intramuscular promethazine.  1:37 AM She got significant relief with above noted treatment but is requesting an additional dose of hydromorphone prior to discharge.  Dione Booze, MD 10/15/12 [phone]

## 2012-10-15 ENCOUNTER — Other Ambulatory Visit: Payer: Self-pay | Admitting: Physician Assistant

## 2012-10-15 DIAGNOSIS — R7989 Other specified abnormal findings of blood chemistry: Secondary | ICD-10-CM

## 2012-10-15 LAB — TB SKIN TEST

## 2012-10-15 MED ORDER — HYDROMORPHONE HCL PF 1 MG/ML IJ SOLN
0.5000 mg | Freq: Once | INTRAMUSCULAR | Status: AC
  Administered 2012-10-15: 0.5 mg via INTRAVENOUS
  Filled 2012-10-15: qty 1

## 2012-10-15 NOTE — ED Notes (Signed)
Placed Pt. On oxy sat sensor due to Pt. Sleepy.  Pt. In no distress with call bell at bedside and rales up bilat.

## 2012-10-15 NOTE — ED Notes (Signed)
Patient is resting comfortably, IV rt AC infiltrated  Removed and a new IV placed in rt thumb with #22  Infusing well at 500cc/hr via pump

## 2012-10-22 ENCOUNTER — Telehealth: Payer: Self-pay | Admitting: Physician Assistant

## 2012-10-22 DIAGNOSIS — R7989 Other specified abnormal findings of blood chemistry: Secondary | ICD-10-CM

## 2012-10-22 NOTE — Telephone Encounter (Signed)
Patient informed, understood & agreed; future lab order placed/SLS  

## 2012-10-22 NOTE — Telephone Encounter (Signed)
Patient is requesting results from last visit °

## 2012-10-25 ENCOUNTER — Ambulatory Visit: Payer: Medicare Other

## 2012-10-26 ENCOUNTER — Ambulatory Visit: Payer: Medicare Other

## 2012-11-03 ENCOUNTER — Emergency Department (HOSPITAL_BASED_OUTPATIENT_CLINIC_OR_DEPARTMENT_OTHER)
Admission: EM | Admit: 2012-11-03 | Discharge: 2012-11-04 | Disposition: A | Discharge status: Home / Self Care | Payer: Medicare PPO | Attending: Emergency Medicine | Admitting: Emergency Medicine

## 2012-11-03 ENCOUNTER — Encounter (HOSPITAL_BASED_OUTPATIENT_CLINIC_OR_DEPARTMENT_OTHER): Payer: Self-pay

## 2012-11-03 DIAGNOSIS — Z8701 Personal history of pneumonia (recurrent): Secondary | ICD-10-CM | POA: Insufficient documentation

## 2012-11-03 DIAGNOSIS — Z8679 Personal history of other diseases of the circulatory system: Secondary | ICD-10-CM | POA: Insufficient documentation

## 2012-11-03 DIAGNOSIS — Z79899 Other long term (current) drug therapy: Secondary | ICD-10-CM | POA: Insufficient documentation

## 2012-11-03 DIAGNOSIS — F411 Generalized anxiety disorder: Secondary | ICD-10-CM | POA: Insufficient documentation

## 2012-11-03 DIAGNOSIS — Z87448 Personal history of other diseases of urinary system: Secondary | ICD-10-CM | POA: Insufficient documentation

## 2012-11-03 DIAGNOSIS — F3177 Bipolar disorder, in partial remission, most recent episode mixed: Secondary | ICD-10-CM | POA: Insufficient documentation

## 2012-11-03 DIAGNOSIS — G43909 Migraine, unspecified, not intractable, without status migrainosus: Secondary | ICD-10-CM | POA: Insufficient documentation

## 2012-11-03 DIAGNOSIS — Z87442 Personal history of urinary calculi: Secondary | ICD-10-CM | POA: Insufficient documentation

## 2012-11-03 DIAGNOSIS — R112 Nausea with vomiting, unspecified: Secondary | ICD-10-CM | POA: Insufficient documentation

## 2012-11-03 DIAGNOSIS — F489 Nonpsychotic mental disorder, unspecified: Secondary | ICD-10-CM | POA: Insufficient documentation

## 2012-11-03 DIAGNOSIS — Z8719 Personal history of other diseases of the digestive system: Secondary | ICD-10-CM | POA: Insufficient documentation

## 2012-11-03 HISTORY — DX: Migraine, unspecified, not intractable, without status migrainosus: G43.909

## 2012-11-03 MED ORDER — HYDROMORPHONE HCL PF 1 MG/ML IJ SOLN
1.0000 mg | Freq: Once | INTRAMUSCULAR | Status: AC
  Administered 2012-11-03: 1 mg via INTRAVENOUS
  Filled 2012-11-03: qty 1

## 2012-11-03 MED ORDER — PROMETHAZINE HCL 25 MG/ML IJ SOLN
25.0000 mg | Freq: Once | INTRAMUSCULAR | Status: AC
  Administered 2012-11-03: 25 mg via INTRAVENOUS
  Filled 2012-11-03: qty 1

## 2012-11-03 MED ORDER — SODIUM CHLORIDE 0.9 % IV BOLUS (SEPSIS)
1000.0000 mL | Freq: Once | INTRAVENOUS | Status: AC
  Administered 2012-11-03: 1000 mL via INTRAVENOUS

## 2012-11-03 NOTE — ED Notes (Signed)
Pt states her husband dropped her off and went back home

## 2012-11-03 NOTE — ED Notes (Signed)
Pt lethargic during iv insertion, able to open eyes spontaneous to voice, but return back to sleeping, questioned patient about being so sleepy to which she stated she had a rough day. Questioned if patient still had migraine and she responded yes, md aware, pt neuro intact, order to continue with iv pain medication and nausea medicine

## 2012-11-03 NOTE — ED Notes (Signed)
C/o migraine HA, vomiting since 5pm

## 2012-11-04 NOTE — ED Provider Notes (Signed)
CSN: [id number]     Arrival date & time 11/03/12  2201 History     First MD Initiated Contact with Patient 11/03/12 2305     Chief Complaint  Patient presents with  . Migraine   (Consider location/radiation/quality/duration/timing/severity/associated sxs/prior Treatment) HPI Pt with hx of migraines presents with c/o migraine headache similar to her prior headaches that has been slowly worsening today.  Pain is located in frontal region.  Associated with nausea and vomiting.  She states she tried to take imitrex but vomited it up.  No changes in vision or speech, no weakness of extremities, no neck pain, no fevers.  Per chart review she usually gets dilaudid and phenergan with relief of headache.  There are no other associated systemic symptoms, there are no other alleviating or modifying factors.   Past Medical History  Diagnosis Date  . Dysrhythmia     Left sided heart diease  . Mental disorder   . Bipolar affective disorder, mixed, in partial or remission   . Headache(784.0)   . Anxiety   . Depression   . 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 History of gallstones   . PONV (postoperative nausea and vomiting)   . Sleep apnea   . Renal disorder   . Kidney stones   . Pneumonia     Hx: of as a child  . Migraine    Past Surgical History  Procedure Laterality Date  . Vaginal hysterectomy    . Cholecystectomy    . Total abdominal hysterectomy w/ bilateral salpingoophorectomy    . Foot surgery      right x 2  . Colonoscopy      2001, 2008.  Father has colon cancer  . Bunionectomy      11/14/2011  . Abdominal hysterectomy    . 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 Colon cancer Father 60  . Stomach cancer Neg Hx   . Rectal cancer  Neg Hx   . Esophageal cancer Neg Hx   . Breast cancer Mother   . Breast cancer Father   . Uterine cancer Mother    History  Substance Use Topics  . Smoking status: Never Smoker   . Smokeless tobacco: Never Used  . Alcohol Use: No   OB History   Grav Para Term Preterm Abortions TAB SAB Ect Mult Living                 Review of Systems ROS reviewed and all otherwise negative except for mentioned in HPI  Allergies  Review of patient's allergies indicates no known allergies.  Home Medications   Current Outpatient Rx  Name  Route  Sig  Dispense  Refill  . hydrOXYzine (VISTARIL) 25 MG capsule   Oral   Take 25 mg by mouth at bedtime.         Marland Kitchen lithium carbonate (LITHOBID) 300 MG CR tablet   Oral   Take 300 mg by mouth every evening.          . methocarbamol (ROBAXIN) 500 MG tablet      Take 1 tablet as needed for pain and muscle spasm.  No more than three tablets per day.   30 tablet   0   .  mupirocin cream (BACTROBAN) 2 %   Topical   Apply topically daily. Please apply to left great toe as prescribed  daily   15 g   0   . oxyCODONE (OXY IR/ROXICODONE) 5 MG immediate release tablet   Oral   Take 5-10 mg by mouth every 6 (six) hours as needed for pain.          Marland Kitchen oxyCODONE-acetaminophen (ROXICET) 5-325 MG per tablet   Oral   Take 1 tablet by mouth every 4 (four) hours as needed for pain.   60 tablet   0   . pregabalin (LYRICA) 75 MG capsule   Oral   Take 75 mg by mouth every evening.          . promethazine (PHENERGAN) 25 MG tablet   Oral   Take 1 tablet (25 mg total) by mouth every 6 (six) hours as needed for nausea.   60 tablet   0   . QUEtiapine (SEROQUEL) 400 MG tablet   Oral   Take 800 mg by mouth at bedtime.          . traZODone (DESYREL) 100 MG tablet   Oral   Take 400 mg by mouth at bedtime.         Marland Kitchen zolpidem (AMBIEN) 10 MG tablet   Oral   Take 10 mg by mouth at bedtime as needed for sleep. For sleep          BP 122/86   Pulse 98  Temp(Src) 97.9 F (36.6 C) (Oral)  Resp 16  Ht 5\' 6"  (1.676 m)  Wt 190 lb (86.183 kg)  BMI 30.68 kg/m2  SpO2 98% Vitals reviewed Physical Exam Physical Examination: General appearance - alert, uncomfortable appearing, and in no distress Mental status - alert, oriented to person, place, and time Eyes - pupils equal and reactive, extraocular eye movements intact Mouth - mucous membranes moist, pharynx normal without lesions Neck - supple, no significant adenopathy Chest - clear to auscultation, no wheezes, rales or rhonchi, symmetric air entry Heart - normal rate, regular rhythm, normal S1, S2, no murmurs, rubs, clicks or gallops Abdomen - soft, nontender, nondistended, no masses or organomegaly Neurological - alert, oriented, normal speech, no focal findings or movement disorder noted Extremities - peripheral pulses normal, no pedal edema, no clubbing or cyanosis Skin - normal coloration and turgor, no rashes  ED Course   Procedures (including critical care time)  Labs Reviewed - No data to display No results found. 1. Migraine headache     MDM  Pt with hx of migraines presenting with headache similar to her prior migraines associated with a nausea and vomiting.  Neurologic exam normal.  Per chart review patient seems to receive dilaudid and phenergan at prior ED visits.  She has received this today and has some relief of headache.  Discharged with strict return precautions.  Pt agreeable with plan.  Ethelda Chick, MD 11/04/12 [phone]

## 2012-11-29 ENCOUNTER — Ambulatory Visit (INDEPENDENT_AMBULATORY_CARE_PROVIDER_SITE_OTHER): Payer: Medicare PPO | Admitting: *Deleted

## 2012-11-29 DIAGNOSIS — Z111 Encounter for screening for respiratory tuberculosis: Secondary | ICD-10-CM

## 2012-11-29 NOTE — Progress Notes (Signed)
  Subjective:    Patient ID: [name], female    DOB: [birth year], 47 y.o.   MRN: [id number]  HPI    Review of Systems     Objective:   Physical Exam        Assessment & Plan:  After obtaining informed consent, the PPD immunization is given by SLS, CMA (AAMA) PPD Placement note [name], 47 y.o. female is here today for placement of PPD test Tuberculin skin test applied to Left ventral forearm. 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  Patient advised to return for reading within 48-72 hours.

## 2012-12-02 ENCOUNTER — Ambulatory Visit: Payer: Medicare PPO

## 2012-12-02 ENCOUNTER — Ambulatory Visit (INDEPENDENT_AMBULATORY_CARE_PROVIDER_SITE_OTHER): Payer: Medicare PPO | Admitting: *Deleted

## 2012-12-02 DIAGNOSIS — Z23 Encounter for immunization: Secondary | ICD-10-CM

## 2012-12-07 ENCOUNTER — Encounter (HOSPITAL_BASED_OUTPATIENT_CLINIC_OR_DEPARTMENT_OTHER): Payer: Self-pay | Admitting: Emergency Medicine

## 2012-12-07 ENCOUNTER — Emergency Department (HOSPITAL_BASED_OUTPATIENT_CLINIC_OR_DEPARTMENT_OTHER)
Admission: EM | Admit: 2012-12-07 | Discharge: 2012-12-07 | Disposition: A | Discharge status: Home / Self Care | Payer: Medicare PPO | Attending: Emergency Medicine | Admitting: Emergency Medicine

## 2012-12-07 DIAGNOSIS — G8929 Other chronic pain: Secondary | ICD-10-CM | POA: Insufficient documentation

## 2012-12-07 DIAGNOSIS — Z8701 Personal history of pneumonia (recurrent): Secondary | ICD-10-CM | POA: Insufficient documentation

## 2012-12-07 DIAGNOSIS — G43909 Migraine, unspecified, not intractable, without status migrainosus: Secondary | ICD-10-CM | POA: Insufficient documentation

## 2012-12-07 DIAGNOSIS — F3177 Bipolar disorder, in partial remission, most recent episode mixed: Secondary | ICD-10-CM | POA: Insufficient documentation

## 2012-12-07 DIAGNOSIS — G4730 Sleep apnea, unspecified: Secondary | ICD-10-CM | POA: Insufficient documentation

## 2012-12-07 DIAGNOSIS — N390 Urinary tract infection, site not specified: Secondary | ICD-10-CM

## 2012-12-07 DIAGNOSIS — Z87442 Personal history of urinary calculi: Secondary | ICD-10-CM | POA: Insufficient documentation

## 2012-12-07 DIAGNOSIS — Z79899 Other long term (current) drug therapy: Secondary | ICD-10-CM | POA: Insufficient documentation

## 2012-12-07 DIAGNOSIS — IMO0002 Reserved for concepts with insufficient information to code with codable children: Secondary | ICD-10-CM | POA: Insufficient documentation

## 2012-12-07 DIAGNOSIS — R1110 Vomiting, unspecified: Secondary | ICD-10-CM | POA: Insufficient documentation

## 2012-12-07 DIAGNOSIS — M5416 Radiculopathy, lumbar region: Secondary | ICD-10-CM

## 2012-12-07 DIAGNOSIS — Z8679 Personal history of other diseases of the circulatory system: Secondary | ICD-10-CM | POA: Insufficient documentation

## 2012-12-07 DIAGNOSIS — Z8719 Personal history of other diseases of the digestive system: Secondary | ICD-10-CM | POA: Insufficient documentation

## 2012-12-07 DIAGNOSIS — F411 Generalized anxiety disorder: Secondary | ICD-10-CM | POA: Insufficient documentation

## 2012-12-07 LAB — URINALYSIS, ROUTINE W REFLEX MICROSCOPIC
Glucose, UA: NEGATIVE mg/dL
Protein, ur: NEGATIVE mg/dL
Specific Gravity, Urine: 1.016 (ref 1.005–1.030)
Urobilinogen, UA: 0.2 mg/dL (ref 0.0–1.0)

## 2012-12-07 LAB — URINE MICROSCOPIC-ADD ON

## 2012-12-07 MED ORDER — DEXAMETHASONE SODIUM PHOSPHATE 10 MG/ML IJ SOLN
10.0000 mg | Freq: Once | INTRAMUSCULAR | Status: AC
  Administered 2012-12-07: 10 mg via INTRAMUSCULAR
  Filled 2012-12-07: qty 1

## 2012-12-07 MED ORDER — KETOROLAC TROMETHAMINE 60 MG/2ML IM SOLN
30.0000 mg | Freq: Once | INTRAMUSCULAR | Status: AC
  Administered 2012-12-07: 30 mg via INTRAMUSCULAR
  Filled 2012-12-07: qty 2

## 2012-12-07 MED ORDER — ONDANSETRON 8 MG PO TBDP
8.0000 mg | ORAL_TABLET | Freq: Once | ORAL | Status: AC
  Administered 2012-12-07: 8 mg via ORAL
  Filled 2012-12-07: qty 1

## 2012-12-07 MED ORDER — CEPHALEXIN 250 MG PO CAPS
500.0000 mg | ORAL_CAPSULE | Freq: Once | ORAL | Status: AC
  Administered 2012-12-07: 500 mg via ORAL
  Filled 2012-12-07: qty 2

## 2012-12-07 MED ORDER — METHOCARBAMOL 500 MG PO TABS: 1000.0000 mg | ORAL_TABLET | Freq: Four times a day (QID) | ORAL | Status: DC | PRN

## 2012-12-07 MED ORDER — CEPHALEXIN 500 MG PO CAPS: 500.0000 mg | ORAL_CAPSULE | Freq: Two times a day (BID) | ORAL | Status: DC

## 2012-12-07 NOTE — ED Provider Notes (Signed)
CSN: [id number]     Arrival date & time 12/07/12  1336 History   First MD Initiated Contact with Patient 12/07/12 1434     Chief Complaint  Patient presents with  . Back Pain  . Emesis   (Consider location/radiation/quality/duration/timing/severity/associated sxs/prior Treatment) HPI  [name] is a 47 y.o. female complaining of exacerbation of chronic low back pain, atraumatic. Patient rates her pain at 10 out of 10, and is located in the low back and radiates down the right leg into the toe. This is been worsening over the course of 4 days. Patient has also had 3 episodes of vomiting over the course of the last 4 days. She states that is when the pain becomes so severe she throws up. She denies any abdominal pain, fever. Emesis is nonbloody and nonbilious. Patient has been taking her Roxicodone and Roxicet at home with little relief. She denies fever, numbness, weakness, difficulty ambulating, change in bowel or bladder habits, history of cancer, history of IV drug use, saddle paresthesia.   Past Medical History  Diagnosis Date  . Dysrhythmia     Left sided heart diease  . Mental disorder   . Bipolar affective disorder, mixed, in partial or remission   . Headache(784.0)   . Anxiety   . Depression   . 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 History of gallstones   . PONV (postoperative nausea and vomiting)   . Sleep apnea   . Renal disorder   . Kidney stones   . Pneumonia     Hx: of as a child  . Migraine    Past Surgical History  Procedure Laterality Date  . Vaginal hysterectomy    . Cholecystectomy    . Total abdominal hysterectomy w/ bilateral salpingoophorectomy    . Foot surgery      right x 2  . Colonoscopy      2001, 2008.  Father has colon cancer  . Bunionectomy      11/14/2011  . Abdominal hysterectomy    . 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 Colon cancer Father 70  . Stomach cancer Neg Hx   . Rectal cancer Neg Hx   . Esophageal cancer Neg Hx   . Breast cancer Mother   . Breast cancer Father   . Uterine cancer Mother    History  Substance Use Topics  . Smoking status: Never Smoker   . Smokeless tobacco: Never Used  . Alcohol Use: No   OB History   Grav Para Term Preterm Abortions TAB SAB Ect Mult Living                 Review of Systems 10 systems reviewed and found to be negative, except as noted in the HPI   Allergies  Review of patient's allergies indicates no known allergies.  Home Medications   Current Outpatient Rx  Name  Route  Sig  Dispense  Refill  . hydrOXYzine (VISTARIL) 25 MG capsule   Oral   Take 25 mg by mouth at bedtime.         Marland Kitchen lithium carbonate (LITHOBID) 300 MG CR tablet   Oral   Take 300 mg by mouth every evening.          Marland Kitchen  methocarbamol (ROBAXIN) 500 MG tablet      Take 1 tablet as needed for pain and muscle spasm.  No more than three tablets per day.   30 tablet   0   . mupirocin cream (BACTROBAN) 2 %   Topical   Apply topically daily. Please apply to left great toe as prescribed  daily   15 g   0   . oxyCODONE (OXY IR/ROXICODONE) 5 MG immediate release tablet   Oral   Take 5-10 mg by mouth every 6 (six) hours as needed for pain.          Marland Kitchen oxyCODONE-acetaminophen (ROXICET) 5-325 MG per tablet   Oral   Take 1 tablet by mouth every 4 (four) hours as needed for pain.   60 tablet   0   . pregabalin (LYRICA) 75 MG capsule   Oral   Take 75 mg by mouth every evening.          . promethazine (PHENERGAN) 25 MG tablet   Oral   Take 1 tablet (25 mg total) by mouth every 6 (six) hours as needed for nausea.   60 tablet   0   . QUEtiapine (SEROQUEL) 400 MG tablet   Oral   Take 800 mg by mouth at bedtime.          .  traZODone (DESYREL) 100 MG tablet   Oral   Take 400 mg by mouth at bedtime.         Marland Kitchen zolpidem (AMBIEN) 10 MG tablet   Oral   Take 10 mg by mouth at bedtime as needed for sleep. For sleep          BP 99/66  Pulse 88  Temp(Src) 98.5 F (36.9 C) (Oral)  Resp 16  Ht 5\' 6"  (1.676 m)  Wt 180 lb (81.647 kg)  BMI 29.07 kg/m2  SpO2 100% Physical Exam  Nursing note and vitals reviewed. Constitutional: She is oriented to person, place, and time. She appears well-developed and well-nourished. No distress.  HENT:  Head: Normocephalic.  Eyes: Conjunctivae and EOM are normal.  Cardiovascular: Normal rate.   Pulmonary/Chest: Effort normal. No stridor.  Musculoskeletal: Normal range of motion.  Strength is 5 out of 5 to bilateral lower extremities at hip and knee, extensor hallucis longus 5 out of 5 on left (Right great toe ampuation). Ankle strength 5 out of 5, no clonus, neurovascularly intact.   Neurological: She is alert and oriented to person, place, and time.  Psychiatric: She has a normal mood and affect.    ED Course  Procedures (including critical care time) Labs Review Labs Reviewed  URINALYSIS, ROUTINE W REFLEX MICROSCOPIC - Abnormal; Notable for the following:    APPearance CLOUDY (*)    Leukocytes, UA LARGE (*)    All other components within normal limits  URINE MICROSCOPIC-ADD ON - Abnormal; Notable for the following:    Squamous Epithelial / LPF FEW (*)    Bacteria, UA MANY (*)    All other components within normal limits  URINE CULTURE   Imaging Review No results found.  MDM   1. Urinary tract infection   2. Lumbar radiculopathy      Filed Vitals:   12/07/12 1349  BP: 99/66  Pulse: 88  Temp: 98.5 F (36.9 C)  TempSrc: Oral  Resp: 16  Height: 5\' 6"  (1.676 m)  Weight: 180 lb (81.647 kg)  SpO2: 100%     [name] is a 48 y.o. female  with acute on chronic low back pain, radicular symptoms. There are no red flags for cauda equina. Patient  specifically requesting Dilaudid. I have advised her that since the Roxicodone and Roxicet are not effective I doubt that more narcotics will be helpful to her. She will be getting Toradol. Patient is found to have urinalysis consistent with infection. Although patient has no symptoms it is reasonable to treat her for UTI at this time.  Patient is upset that she has not received Dilaudid, she has requested multiple times. Pt has h/o fentanyl abuse. I have explained to her that narcotics are not indicated and that she will need to follow with her primary care physician for management of her chronic low back pain.  Medications  ketorolac (TORADOL) injection 30 mg (30 mg Intramuscular Given 12/07/12 1455)  ondansetron (ZOFRAN-ODT) disintegrating tablet 8 mg (8 mg Oral Given 12/07/12 1455)  dexamethasone (DECADRON) injection 10 mg (10 mg Intramuscular Given 12/07/12 1455)  cephALEXin (KEFLEX) capsule 500 mg (500 mg Oral Given 12/07/12 1502)    Pt is hemodynamically stable, appropriate for, and amenable to discharge at this time. Pt verbalized understanding and agrees with care plan. All questions answered. Outpatient follow-up and specific return precautions discussed.    New Prescriptions   CEPHALEXIN (KEFLEX) 500 MG CAPSULE    Take 1 capsule (500 mg total) by mouth 2 (two) times daily.   METHOCARBAMOL (ROBAXIN) 500 MG TABLET    Take 2 tablets (1,000 mg total) by mouth 4 (four) times daily as needed (Pain).    Note: Portions of this report may have been transcribed using voice recognition software. Every effort was made to ensure accuracy; however, inadvertent computerized transcription errors may be present      Wynetta Emery, PA-C 12/07/12 2016

## 2012-12-07 NOTE — ED Notes (Signed)
Back pain, pain down right leg, and vomiting x3 days. Hx of sciatica.

## 2012-12-08 LAB — URINE CULTURE: Colony Count: >=100000

## 2012-12-09 NOTE — ED Provider Notes (Signed)
Medical screening examination/treatment/procedure(s) were performed by non-physician practitioner and as supervising physician I was immediately available for consultation/collaboration.  Makye Radle, MD 12/09/12 1254 

## 2012-12-14 ENCOUNTER — Encounter: Payer: Self-pay | Admitting: *Deleted

## 2012-12-25 ENCOUNTER — Emergency Department (HOSPITAL_COMMUNITY): Payer: Medicare PPO

## 2012-12-25 ENCOUNTER — Encounter (HOSPITAL_COMMUNITY): Payer: Self-pay | Admitting: Emergency Medicine

## 2012-12-25 ENCOUNTER — Emergency Department (HOSPITAL_COMMUNITY)
Admission: EM | Admit: 2012-12-25 | Discharge: 2012-12-25 | Disposition: A | Discharge status: Home / Self Care | Payer: Medicare PPO | Attending: Emergency Medicine | Admitting: Emergency Medicine

## 2012-12-25 DIAGNOSIS — Z8701 Personal history of pneumonia (recurrent): Secondary | ICD-10-CM | POA: Insufficient documentation

## 2012-12-25 DIAGNOSIS — Z79899 Other long term (current) drug therapy: Secondary | ICD-10-CM | POA: Insufficient documentation

## 2012-12-25 DIAGNOSIS — F411 Generalized anxiety disorder: Secondary | ICD-10-CM | POA: Insufficient documentation

## 2012-12-25 DIAGNOSIS — Z87442 Personal history of urinary calculi: Secondary | ICD-10-CM | POA: Insufficient documentation

## 2012-12-25 DIAGNOSIS — K5900 Constipation, unspecified: Secondary | ICD-10-CM | POA: Insufficient documentation

## 2012-12-25 DIAGNOSIS — G43909 Migraine, unspecified, not intractable, without status migrainosus: Secondary | ICD-10-CM | POA: Insufficient documentation

## 2012-12-25 DIAGNOSIS — N390 Urinary tract infection, site not specified: Secondary | ICD-10-CM | POA: Insufficient documentation

## 2012-12-25 DIAGNOSIS — F319 Bipolar disorder, unspecified: Secondary | ICD-10-CM | POA: Insufficient documentation

## 2012-12-25 LAB — URINALYSIS, DIPSTICK ONLY
Nitrite: NEGATIVE
Specific Gravity, Urine: 1.02 (ref 1.005–1.030)
pH: 6 (ref 5.0–8.0)

## 2012-12-25 MED ORDER — NAPROXEN 500 MG PO TABS: 500.0000 mg | ORAL_TABLET | Freq: Two times a day (BID) | ORAL | Status: DC

## 2012-12-25 MED ORDER — ONDANSETRON 8 MG PO TBDP
8.0000 mg | ORAL_TABLET | Freq: Once | ORAL | Status: AC
  Administered 2012-12-25: 8 mg via ORAL
  Filled 2012-12-25: qty 1

## 2012-12-25 MED ORDER — NAPROXEN 375 MG PO TABS
375.0000 mg | ORAL_TABLET | Freq: Once | ORAL | Status: AC
  Administered 2012-12-25: 375 mg via ORAL
  Filled 2012-12-25: qty 1

## 2012-12-25 MED ORDER — SULFAMETHOXAZOLE-TRIMETHOPRIM 800-160 MG PO TABS: 1.0000 | ORAL_TABLET | Freq: Two times a day (BID) | ORAL | Status: AC

## 2012-12-25 NOTE — ED Notes (Signed)
Pt states she has sciatica- back pain and abd pain started yesterday. RLQ and LLQ abd pain sharp, back is dull/throbbing, shoots down right leg. Reports nausea and vomiting. Takes oxycodone for chronic pain, tried to take one this morning but vomited it up

## 2012-12-25 NOTE — ED Notes (Signed)
Patient is alert and oriented x3.  She was given DC instructions and follow up visit instructions.  Patient gave verbal understanding. She was DC ambulatory under her own power to home.  V/S stable.  He was not showing any signs of distress on DC 

## 2012-12-25 NOTE — ED Provider Notes (Signed)
47 year old female with a history of chronic abdominal pain as well as chronic back pain.  She presents with complaint of abdominal pain mostly on the left side of the lower quadrant, also her chronic back pain with radiation into her legs. She takes daily oxycodone and OxyContin, states that she has not had it yet today and wants some pain medications. She is concerned because she has not had a bowel movement in over one week. She is passing gas and has had one episode of vomiting today but has also been able to eat and drink as well. On my exam the patient has a slightly obese abdomen, it is soft, she has a fullness in the left lower quadrant but no significant tenderness. She appears otherwise well but clear heart and lung sounds, no peripheral edema. X-rays pending to evaluate for stool furtive, she does not have a surgical or acute abdomen at this time given her lack of bowel movements over the last week and a half I suggest that and suspect that this is constipation and unlikely to be related to a more significant or surgical condition. Check urinalysis for urinary infection, the patient has had a hysterectomy, and a pregnancy test is not warranted. I told the patient she would not be getting narcotic medications as this would only worsen her condition if related to stool burden.  Results for orders placed during the hospital encounter of 12/25/12  URINALYSIS, DIPSTICK ONLY      Result Value Range   Specific Gravity, Urine 1.020  1.005 - 1.030   pH 6.0  5.0 - 8.0   Glucose, UA NEGATIVE  NEGATIVE mg/dL   Hgb urine dipstick NEGATIVE  NEGATIVE   Bilirubin Urine NEGATIVE  NEGATIVE   Ketones, ur NEGATIVE  NEGATIVE mg/dL   Protein, ur NEGATIVE  NEGATIVE mg/dL   Urobilinogen, UA 0.2  0.0 - 1.0 mg/dL   Nitrite NEGATIVE  NEGATIVE   Leukocytes, UA LARGE (*) NEGATIVE   Dg Abd 2 Views  12/25/2012   CLINICAL DATA:  Back pain. Right lower quadrant and left lower quadrant abdominal pain.  EXAM: ABDOMEN - 2  VIEW  COMPARISON:  Abdominal radiograph 05/03/2012.  FINDINGS: Gas and stool are seen scattered throughout the colon extending to the level of the distal rectum. No pathologic distension of small bowel is noted. No gross evidence of pneumoperitoneum. Surgical clips project over right upper quadrant of the abdomen, compatible with prior cholecystectomy.  IMPRESSION: 1.  Nonobstructive bowel gas pattern. 2. No pneumoperitoneum. 3. Status post cholecystectomy.   Electronically Signed   By: Trudie Reed M.D.   On: 12/25/2012 18:41    Pt informed of results.  meds rx by PA Jimmey Ralph, pt appears stable for d/c.  Medical screening examination/treatment/procedure(s) were conducted as a shared visit with non-physician practitioner(s) and myself.  I personally evaluated the patient during the encounter.        Vida Roller, MD 12/26/12 [phone]

## 2012-12-25 NOTE — ED Provider Notes (Signed)
CSN: [id number]     Arrival date & time 12/25/12  1647 History   First MD Initiated Contact with Patient 12/25/12 1753     Chief Complaint  Patient presents with  . Sciatica  . Abdominal Pain   (Consider location/radiation/quality/duration/timing/severity/associated sxs/prior Treatment) HPI Comments: 47 year old female presents with a week history of diffuse abdominal discomfort worsening yesterday.   She reports last BM 7 days ago and states she tried two enemas unsuccessfully at home. She is on Oxycodone due to chronic pain. She states one episode of vomiting today with nausea.  Reports hysterectomy several years ago. Denies fever or chills.   She also complains of Right lower back pain, similar to the discomfort from sciatic discomfort in the past.  Denies weakness or paresthesia.  The history is provided by the patient.    Past Medical History  Diagnosis Date  . Dysrhythmia     Left sided heart diease  . Mental disorder   . Bipolar affective disorder, mixed, in partial or remission   . Headache(784.0)   . Anxiety   . Depression   . 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 History of gallstones   . PONV (postoperative nausea and vomiting)   . Sleep apnea   . Renal disorder   . Kidney stones   . Pneumonia     Hx: of as a child  . Migraine    Past Surgical History  Procedure Laterality Date  . Vaginal hysterectomy    . Cholecystectomy    . Total abdominal hysterectomy w/ bilateral salpingoophorectomy    . Foot surgery      right x 2  . Colonoscopy      2001, 2008.  Father has colon cancer  . Bunionectomy      11/14/2011  . Abdominal hysterectomy    . 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 Colon cancer Father 59  . Stomach  cancer Neg Hx   . Rectal cancer Neg Hx   . Esophageal cancer Neg Hx   . Breast cancer Mother   . Breast cancer Father   . Uterine cancer Mother    History  Substance Use Topics  . Smoking status: Never Smoker   . Smokeless tobacco: Never Used  . Alcohol Use: No   OB History   Grav Para Term Preterm Abortions TAB SAB Ect Mult Living                 Review of Systems  All other systems reviewed and are negative.    Allergies  Review of patient's allergies indicates no known allergies.  Home Medications   Current Outpatient Rx  Name  Route  Sig  Dispense  Refill  . hydrOXYzine (VISTARIL) 25 MG capsule   Oral   Take 25 mg by mouth at bedtime.         Marland Kitchen lithium carbonate (LITHOBID) 300 MG CR tablet   Oral   Take 300 mg by mouth every evening.          Marland Kitchen oxyCODONE (OXY IR/ROXICODONE) 5 MG immediate release tablet   Oral   Take 5-10 mg by mouth every 6 (six) hours as needed for pain.          Marland Kitchen  pregabalin (LYRICA) 75 MG capsule   Oral   Take 75 mg by mouth every evening.          Marland Kitchen QUEtiapine (SEROQUEL) 400 MG tablet   Oral   Take 800 mg by mouth at bedtime.          . traZODone (DESYREL) 100 MG tablet   Oral   Take 400 mg by mouth at bedtime.         Marland Kitchen zolpidem (AMBIEN) 10 MG tablet   Oral   Take 10 mg by mouth at bedtime as needed for sleep. For sleep         . naproxen (NAPROSYN) 500 MG tablet   Oral   Take 1 tablet (500 mg total) by mouth 2 (two) times daily with a meal.   30 tablet   0   . sulfamethoxazole-trimethoprim (BACTRIM DS,SEPTRA DS) 800-160 MG per tablet   Oral   Take 1 tablet by mouth 2 (two) times daily.   7 tablet   0    BP 107/67  Pulse 91  Temp(Src) 98.2 F (36.8 C) (Oral)  Resp 17  SpO2 98% Physical Exam  Nursing note and vitals reviewed. Constitutional: She appears well-developed and well-nourished. No distress.  HENT:  Head: Normocephalic and atraumatic.  Eyes: No scleral icterus.  Neck: Neck supple.   Cardiovascular: Normal rate, regular rhythm and normal heart sounds.   Pulmonary/Chest: Effort normal and breath sounds normal. No respiratory distress. She has no wheezes.  Abdominal: Soft. Normal appearance and bowel sounds are normal. There is tenderness in the suprapubic area. There is no rigidity, no guarding, no CVA tenderness and negative Murphy's sign.  Diffuse tenderness.   Musculoskeletal: She exhibits no edema.       Thoracic back: She exhibits no bony tenderness and no spasm.       Lumbar back: She exhibits no bony tenderness and no spasm.  Neurological: She is alert.  Reflex Scores:      Patellar reflexes are 2+ on the right side and 2+ on the left side. Bilateral LE strength equal  Skin: Skin is warm, dry and intact. No rash noted.  Psychiatric: She has a normal mood and affect.    ED Course  Procedures (including critical care time) Labs Review Labs Reviewed  URINALYSIS, DIPSTICK ONLY - Abnormal; Notable for the following:    Leukocytes, UA LARGE (*)    All other components within normal limits  URINE CULTURE   Imaging Review Dg Abd 2 Views  12/25/2012   CLINICAL DATA:  Back pain. Right lower quadrant and left lower quadrant abdominal pain.  EXAM: ABDOMEN - 2 VIEW  COMPARISON:  Abdominal radiograph 05/03/2012.  FINDINGS: Gas and stool are seen scattered throughout the colon extending to the level of the distal rectum. No pathologic distension of small bowel is noted. No gross evidence of pneumoperitoneum. Surgical clips project over right upper quadrant of the abdomen, compatible with prior cholecystectomy.  IMPRESSION: 1.  Nonobstructive bowel gas pattern. 2. No pneumoperitoneum. 3. Status post cholecystectomy.   Electronically Signed   By: Trudie Reed M.D.   On: 12/25/2012 18:41    EKG Interpretation   None       MDM   1. Constipation   2. UTI (lower urinary tract infection)    Patient with chronic history of narcotic use and several day history of  constipation.  No signs of an acute abdomen on exam.  Will obtain an XR to evaluate for stool  or possible bowl obstruction.  XR negative for bowl obstruction. Will place patient on a bowl regimen and non-narcotic pain medication.    Discussed results and plan with patient.  Agrees to follow up and discuss a bowl regimen with her pain specialist if continuing with narcotic therapy.  Antibiotics given for UTI.    Clabe Seal, PA-C 12/25/12 [phone]

## 2012-12-26 NOTE — ED Provider Notes (Signed)
Medical screening examination/treatment/procedure(s) were conducted as a shared visit with non-physician practitioner(s) and myself.  I personally evaluated the patient during the encounter  Please see my separate respective documentation pertaining to this patient encounter   Vida Roller, MD 12/26/12 [phone]

## 2012-12-27 LAB — URINE CULTURE: Colony Count: 80000

## 2012-12-29 ENCOUNTER — Encounter (HOSPITAL_BASED_OUTPATIENT_CLINIC_OR_DEPARTMENT_OTHER): Payer: Self-pay | Admitting: Emergency Medicine

## 2012-12-29 ENCOUNTER — Emergency Department (HOSPITAL_BASED_OUTPATIENT_CLINIC_OR_DEPARTMENT_OTHER)
Admission: EM | Admit: 2012-12-29 | Discharge: 2012-12-29 | Disposition: A | Discharge status: Home / Self Care | Payer: Medicare PPO | Attending: Emergency Medicine | Admitting: Emergency Medicine

## 2012-12-29 DIAGNOSIS — Z87442 Personal history of urinary calculi: Secondary | ICD-10-CM | POA: Insufficient documentation

## 2012-12-29 DIAGNOSIS — G8929 Other chronic pain: Secondary | ICD-10-CM | POA: Insufficient documentation

## 2012-12-29 DIAGNOSIS — Z8719 Personal history of other diseases of the digestive system: Secondary | ICD-10-CM | POA: Insufficient documentation

## 2012-12-29 DIAGNOSIS — F411 Generalized anxiety disorder: Secondary | ICD-10-CM | POA: Insufficient documentation

## 2012-12-29 DIAGNOSIS — G43909 Migraine, unspecified, not intractable, without status migrainosus: Secondary | ICD-10-CM

## 2012-12-29 DIAGNOSIS — Z79899 Other long term (current) drug therapy: Secondary | ICD-10-CM | POA: Insufficient documentation

## 2012-12-29 DIAGNOSIS — Z8679 Personal history of other diseases of the circulatory system: Secondary | ICD-10-CM | POA: Insufficient documentation

## 2012-12-29 DIAGNOSIS — F489 Nonpsychotic mental disorder, unspecified: Secondary | ICD-10-CM | POA: Insufficient documentation

## 2012-12-29 DIAGNOSIS — F3177 Bipolar disorder, in partial remission, most recent episode mixed: Secondary | ICD-10-CM | POA: Insufficient documentation

## 2012-12-29 DIAGNOSIS — R110 Nausea: Secondary | ICD-10-CM

## 2012-12-29 DIAGNOSIS — Z8701 Personal history of pneumonia (recurrent): Secondary | ICD-10-CM | POA: Insufficient documentation

## 2012-12-29 MED ORDER — METOCLOPRAMIDE HCL 5 MG/ML IJ SOLN
10.0000 mg | Freq: Once | INTRAMUSCULAR | Status: AC
  Administered 2012-12-29: 10 mg via INTRAVENOUS
  Filled 2012-12-29: qty 2

## 2012-12-29 MED ORDER — PROMETHAZINE HCL 25 MG/ML IJ SOLN
25.0000 mg | Freq: Once | INTRAMUSCULAR | Status: AC
  Administered 2012-12-29: 25 mg via INTRAVENOUS
  Filled 2012-12-29: qty 1

## 2012-12-29 MED ORDER — KETOROLAC TROMETHAMINE 30 MG/ML IJ SOLN
30.0000 mg | Freq: Once | INTRAMUSCULAR | Status: AC
  Administered 2012-12-29: 30 mg via INTRAVENOUS
  Filled 2012-12-29: qty 1

## 2012-12-29 MED ORDER — SODIUM CHLORIDE 0.9 % IV BOLUS (SEPSIS)
1000.0000 mL | Freq: Once | INTRAVENOUS | Status: AC
  Administered 2012-12-29: 1000 mL via INTRAVENOUS

## 2012-12-29 MED ORDER — DIPHENHYDRAMINE HCL 50 MG/ML IJ SOLN
25.0000 mg | Freq: Once | INTRAMUSCULAR | Status: AC
  Administered 2012-12-29: 25 mg via INTRAVENOUS
  Filled 2012-12-29: qty 1

## 2012-12-29 NOTE — ED Provider Notes (Signed)
CSN: [id number]     Arrival date & time 12/29/12  1221 History   First MD Initiated Contact with Patient 12/29/12 1233     Chief Complaint  Patient presents with  . Migraine  . Emesis   (Consider location/radiation/quality/duration/timing/severity/associated sxs/prior Treatment) HPI Comments: Patient is a 47 year old female with a past medical history of bipolar disorder, chronic back pain, depression/anxiety, and migraines who presents to the ED with a headache for 2 days. Patient reports a gradual onset and progressive worsening of the headache. The pain is sharp, constant and is located in generalized head without radiation. Patient has tried oxycodone for symptoms without relief. No alleviating/aggravating factors. Patient reports associated nausea and vomiting. Patient denies fever, diarrhea, numbness/tingling, weakness, visual changes, congestion, chest pain, SOB, abdominal pain.      Past Medical History  Diagnosis Date  . Dysrhythmia     Left sided heart diease  . Mental disorder   . Bipolar affective disorder, mixed, in partial or remission   . Headache(784.0)   . Anxiety   . Depression   . 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 History of gallstones   . PONV (postoperative nausea and vomiting)   . Sleep apnea   . Renal disorder   . Kidney stones   . Pneumonia     Hx: of as a child  . Migraine    Past Surgical History  Procedure Laterality Date  . Vaginal hysterectomy    . Cholecystectomy    . Total abdominal hysterectomy w/ bilateral salpingoophorectomy    . Foot surgery      right x 2  . Colonoscopy      2001, 2008.  Father has colon cancer  . Bunionectomy      11/14/2011  . Abdominal hysterectomy    . 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 Colon cancer Father 12  . Stomach cancer Neg Hx   . Rectal cancer Neg Hx   . Esophageal cancer Neg Hx   . Breast cancer Mother   . Breast cancer Father   . Uterine cancer Mother    History  Substance Use Topics  . Smoking status: Never Smoker   . Smokeless tobacco: Never Used  . Alcohol Use: No   OB History   Grav Para Term Preterm Abortions TAB SAB Ect Mult Living                 Review of Systems  Gastrointestinal: Positive for nausea and vomiting.  Neurological: Positive for headaches.  All other systems reviewed and are negative.    Allergies  Review of patient's allergies indicates no known allergies.  Home Medications   Current Outpatient Rx  Name  Route  Sig  Dispense  Refill  . hydrOXYzine (VISTARIL) 25 MG capsule   Oral   Take 25 mg by mouth at bedtime.         Marland Kitchen lithium carbonate (LITHOBID) 300 MG CR tablet   Oral   Take 300 mg by mouth every evening.          . naproxen (NAPROSYN) 500 MG tablet   Oral   Take 1 tablet (500 mg total) by mouth 2 (two) times daily with a  meal.   30 tablet   0   . oxyCODONE (OXY IR/ROXICODONE) 5 MG immediate release tablet   Oral   Take 5-10 mg by mouth every 6 (six) hours as needed for pain.          . pregabalin (LYRICA) 75 MG capsule   Oral   Take 75 mg by mouth every evening.          Marland Kitchen QUEtiapine (SEROQUEL) 400 MG tablet   Oral   Take 800 mg by mouth at bedtime.          . sulfamethoxazole-trimethoprim (BACTRIM DS,SEPTRA DS) 800-160 MG per tablet   Oral   Take 1 tablet by mouth 2 (two) times daily.   7 tablet   0   . traZODone (DESYREL) 100 MG tablet   Oral   Take 400 mg by mouth at bedtime.         Marland Kitchen zolpidem (AMBIEN) 10 MG tablet   Oral   Take 10 mg by mouth at bedtime as needed for sleep. For sleep          BP 116/82  Pulse 82  Temp(Src) 98.3 F (36.8 C) (Oral)  Resp 16  Ht 5\' 6"  (1.676 m)  Wt 180 lb (81.647 kg)  BMI 29.07 kg/m2  SpO2 99% Physical  Exam  Nursing note and vitals reviewed. Constitutional: She is oriented to person, place, and time. She appears well-developed and well-nourished. No distress.  HENT:  Head: Normocephalic and atraumatic.  Eyes: Conjunctivae and EOM are normal.  Neck: Normal range of motion.  No meningeal signs.   Cardiovascular: Normal rate and regular rhythm.  Exam reveals no gallop and no friction rub.   No murmur heard. Pulmonary/Chest: Effort normal and breath sounds normal. She has no wheezes. She has no rales. She exhibits no tenderness.  Abdominal: Soft. She exhibits no distension. There is no tenderness. There is no rebound and no guarding.  Musculoskeletal: Normal range of motion.  Neurological: She is alert and oriented to person, place, and time. Coordination normal.  Speech is goal-oriented. Moves limbs without ataxia.   Skin: Skin is warm and dry.  Psychiatric: She has a normal mood and affect. Her behavior is normal.    ED Course  Procedures (including critical care time) Labs Review Labs Reviewed - No data to display Imaging Review No results found.  EKG Interpretation   None       MDM   1. Migraine   2. Nausea     12:55 PM Patient will have toradol, reglan, and benadryl for migraine. Vitals stable and patient afebrile. No meningeal signs.   2:45 PM Patient reports minimal relief with migraine cocktail. Patient is specifically requesting IV Dilaudid and Phenergan. I explained to the patient I do not treat migraines with IV dilaudid. Patient is not happy. I will order phenergan for her nausea.   4:33 PM Patient is feeling better. Patient did not have IV Dilaudid while she was here, likely indicating she can do without IV narcotics in the future for migraines. Vitals stable and patient afebrile. Patient instructed to follow up with her PCP. No further evaluation needed at this time.    Emilia Beck, PA-C 12/29/12 1636

## 2012-12-29 NOTE — ED Notes (Signed)
Pt c/o vomiting that started yesterday after lunch and a migraine that started last night. Pain scale 8/10. Pt reports pain is behind right eye. Pt has hx of migraines. Pt denies dizziness, weakness. Pt A&Ox4. NAD noted.  Pt sts. She took oxycodone yesterday afternoon with no relief, reports she vomited medication up.

## 2012-12-31 NOTE — ED Provider Notes (Signed)
  Medical screening examination/treatment/procedure(s) were performed by non-physician practitioner and as supervising physician I was immediately available for consultation/collaboration.    Harvin Konicek, MD 12/31/12 1620 

## 2013-01-13 ENCOUNTER — Encounter (HOSPITAL_BASED_OUTPATIENT_CLINIC_OR_DEPARTMENT_OTHER): Payer: Self-pay | Admitting: Emergency Medicine

## 2013-01-13 ENCOUNTER — Emergency Department (HOSPITAL_BASED_OUTPATIENT_CLINIC_OR_DEPARTMENT_OTHER)
Admission: EM | Admit: 2013-01-13 | Discharge: 2013-01-14 | Disposition: A | Discharge status: Home / Self Care | Payer: Medicare HMO | Attending: Emergency Medicine | Admitting: Emergency Medicine

## 2013-01-13 DIAGNOSIS — M5431 Sciatica, right side: Secondary | ICD-10-CM

## 2013-01-13 DIAGNOSIS — Z87442 Personal history of urinary calculi: Secondary | ICD-10-CM | POA: Insufficient documentation

## 2013-01-13 DIAGNOSIS — Z8679 Personal history of other diseases of the circulatory system: Secondary | ICD-10-CM | POA: Insufficient documentation

## 2013-01-13 DIAGNOSIS — Z8701 Personal history of pneumonia (recurrent): Secondary | ICD-10-CM | POA: Insufficient documentation

## 2013-01-13 DIAGNOSIS — Z791 Long term (current) use of non-steroidal anti-inflammatories (NSAID): Secondary | ICD-10-CM | POA: Insufficient documentation

## 2013-01-13 DIAGNOSIS — M79609 Pain in unspecified limb: Secondary | ICD-10-CM | POA: Insufficient documentation

## 2013-01-13 DIAGNOSIS — Z87448 Personal history of other diseases of urinary system: Secondary | ICD-10-CM | POA: Insufficient documentation

## 2013-01-13 DIAGNOSIS — F3177 Bipolar disorder, in partial remission, most recent episode mixed: Secondary | ICD-10-CM | POA: Insufficient documentation

## 2013-01-13 DIAGNOSIS — Z79899 Other long term (current) drug therapy: Secondary | ICD-10-CM | POA: Insufficient documentation

## 2013-01-13 DIAGNOSIS — G4730 Sleep apnea, unspecified: Secondary | ICD-10-CM | POA: Insufficient documentation

## 2013-01-13 DIAGNOSIS — G8929 Other chronic pain: Secondary | ICD-10-CM | POA: Insufficient documentation

## 2013-01-13 DIAGNOSIS — Z8719 Personal history of other diseases of the digestive system: Secondary | ICD-10-CM | POA: Insufficient documentation

## 2013-01-13 DIAGNOSIS — M5430 Sciatica, unspecified side: Secondary | ICD-10-CM | POA: Insufficient documentation

## 2013-01-13 DIAGNOSIS — F411 Generalized anxiety disorder: Secondary | ICD-10-CM | POA: Insufficient documentation

## 2013-01-13 MED ORDER — DEXAMETHASONE SODIUM PHOSPHATE 10 MG/ML IJ SOLN
10.0000 mg | Freq: Once | INTRAMUSCULAR | Status: AC
  Administered 2013-01-13: 10 mg via INTRAMUSCULAR
  Filled 2013-01-13: qty 1

## 2013-01-13 MED ORDER — METHOCARBAMOL 500 MG PO TABS
1000.0000 mg | ORAL_TABLET | Freq: Once | ORAL | Status: AC
  Administered 2013-01-13: 1000 mg via ORAL
  Filled 2013-01-13: qty 2

## 2013-01-13 MED ORDER — KETOROLAC TROMETHAMINE 30 MG/ML IJ SOLN
INTRAMUSCULAR | Status: AC
  Filled 2013-01-13: qty 2

## 2013-01-13 MED ORDER — KETOROLAC TROMETHAMINE 60 MG/2ML IM SOLN
60.0000 mg | Freq: Once | INTRAMUSCULAR | Status: AC
  Administered 2013-01-13: 60 mg via INTRAMUSCULAR
  Filled 2013-01-13: qty 2

## 2013-01-13 NOTE — ED Provider Notes (Signed)
CSN: [id number]     Arrival date & time 01/13/13  2258 History  This chart was scribed for [name] Guedes Smitty Cords, MD by Ardelia Mems, ED Scribe. This patient was seen in room MH11/MH11 and the patient's care was started at 11:15 PM.   Chief Complaint  Patient presents with  . Back Pain    Patient is a 47 y.o. female presenting with back pain. The history is provided by the patient. No language interpreter was used.  Back Pain Location:  Lumbar spine Quality:  Unable to specify Radiates to: "down the back of right leg to right ankle" Pain severity:  Moderate Onset quality:  Gradual Timing:  Constant Progression:  Worsening Chronicity:  Recurrent Context: not falling and not MCA   Relieved by:  Nothing Worsened by:  Nothing tried Ineffective treatments: Percocet 3 hours ago. Associated symptoms: no abdominal pain, no abdominal swelling, no bladder incontinence, no bowel incontinence, no chest pain, no dysuria, no fever, no headaches, no numbness, no paresthesias, no pelvic pain, no perianal numbness, no tingling, no weakness and no weight loss   Risk factors: no hx of cancer     HPI Comments: [name] is a 47 y.o. female with a history of substance abuse, bipolar affective disorder and sciatica who presents to the Emergency Department complaining of constant, moderate right-sided back pain that radiates down the back of her right leg to her right ankle, onset earlier today. Pt states that she took her prescribed Percocet about 3 hours ago without relief of her pain. She states that she has chronic foot pain. She states that she went on a long drive recently, which she believes may have initiated a flare-up of her sciatica pain which she has had in the past. She denies dysuria, constipation, diarrhea, rash or any other symptoms.  PCP- Dr. Marcelline Mates   Past Medical History  Diagnosis Date  . Dysrhythmia     Left sided heart diease  . Mental disorder   . Bipolar affective  disorder, mixed, in partial or remission   . Headache(784.0)   . Anxiety   . Depression   . 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 History of gallstones   . PONV (postoperative nausea and vomiting)   . Sleep apnea   . Renal disorder   . Kidney stones   . Pneumonia     Hx: of as a child  . Migraine    Past Surgical History  Procedure Laterality Date  . Vaginal hysterectomy    . Cholecystectomy    . Total abdominal hysterectomy w/ bilateral salpingoophorectomy    . Foot surgery      right x 2  . Colonoscopy      2001, 2008.  Father has colon cancer  . Bunionectomy      11/14/2011  . Abdominal hysterectomy    . 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 Colon cancer Father 66  . Stomach cancer Neg Hx   . Rectal cancer Neg Hx   . Esophageal cancer Neg Hx   . Breast cancer Mother   . Breast cancer Father   . Uterine cancer Mother    History  Substance Use Topics  . Smoking status: Never Smoker   .  Smokeless tobacco: Never Used  . Alcohol Use: No   OB History   Grav Para Term Preterm Abortions TAB SAB Ect Mult Living                 Review of Systems  Constitutional: Negative for fever and weight loss.  Cardiovascular: Negative for chest pain.  Gastrointestinal: Negative for abdominal pain, diarrhea, constipation and bowel incontinence.  Genitourinary: Negative for bladder incontinence, dysuria, difficulty urinating and pelvic pain.  Musculoskeletal: Positive for back pain.  Skin: Negative for rash.  Neurological: Negative for tingling, weakness, numbness, headaches and paresthesias.  All other systems reviewed and are negative.    Allergies  Review of patient's allergies indicates no known allergies.  Home Medications    Current Outpatient Rx  Name  Route  Sig  Dispense  Refill  . hydrOXYzine (VISTARIL) 25 MG capsule   Oral   Take 25 mg by mouth at bedtime.         Marland Kitchen lithium carbonate (LITHOBID) 300 MG CR tablet   Oral   Take 300 mg by mouth every evening.          Marland Kitchen oxyCODONE (OXY IR/ROXICODONE) 5 MG immediate release tablet   Oral   Take 5-10 mg by mouth every 6 (six) hours as needed for pain.          . pregabalin (LYRICA) 75 MG capsule   Oral   Take 75 mg by mouth every evening.          Marland Kitchen QUEtiapine (SEROQUEL) 400 MG tablet   Oral   Take 800 mg by mouth at bedtime.          . traZODone (DESYREL) 100 MG tablet   Oral   Take 400 mg by mouth at bedtime.         Marland Kitchen zolpidem (AMBIEN) 10 MG tablet   Oral   Take 10 mg by mouth at bedtime as needed for sleep. For sleep         . naproxen (NAPROSYN) 500 MG tablet   Oral   Take 1 tablet (500 mg total) by mouth 2 (two) times daily with a meal.   30 tablet   0    Triage Vitals: BP 121/74  Pulse 101  Temp(Src) 98.6 F (37 C) (Oral)  Resp 20  Ht 5\' 7"  (1.702 m)  Wt 190 lb (86.183 kg)  BMI 29.75 kg/m2  SpO2 98%  Physical Exam  Nursing note and vitals reviewed. Constitutional: She is oriented to person, place, and time. She appears well-developed and well-nourished. No distress.  HENT:  Head: Normocephalic and atraumatic.  Moist mucous membranes.  Eyes: EOM are normal.  Neck: Neck supple. No tracheal deviation present.  Cardiovascular: Normal rate, regular rhythm and normal heart sounds.   Intact DP pulses bilaterally.  Pulmonary/Chest: Effort normal and breath sounds normal. No stridor. No respiratory distress. She has no wheezes. She has no rales.  Abdominal: Soft. Bowel sounds are normal. There is no tenderness. There is no rebound and no guarding.  Musculoskeletal: Normal range of motion.  No step-offs or crepitance of L-spine. No bony tenderness. Pelvis non-tender. Full ROM.  Neurological: She is alert and  oriented to person, place, and time. She displays normal reflexes.  Intact lower DTRs.  Skin: Skin is warm and dry.  Psychiatric: She has a normal mood and affect. Her behavior is normal.    ED Course  Procedures (including critical care time)  DIAGNOSTIC STUDIES: Oxygen Saturation is  98% on RA, normal by my interpretation.    COORDINATION OF CARE: 11:23 PM- Discussed plan for pt to receive Toradol, Robaxin and Decadron in the ED. Pt advised of plan for treatment and pt agrees.  Medications  ketorolac (TORADOL) injection 60 mg (not administered)  methocarbamol (ROBAXIN) tablet 1,000 mg (not administered)  dexamethasone (DECADRON) injection 10 mg (not administered)   Labs Review Labs Reviewed - No data to display Imaging Review No results found.  EKG Interpretation   None       MDM  No diagnosis found. Sciatica.  Patient is on narcotic pain medication currently will add nsaid and muscle relaxant  I personally performed the services described in this documentation, which was scribed in my presence. The recorded information has been reviewed and is accurate.      [name] Awe, MD 01/14/13 [id number]

## 2013-01-13 NOTE — ED Notes (Signed)
Pt c/o right sided back pain and leg pain, pt has hx of sciatica. No relied from percocet at 8 pm

## 2013-01-14 MED ORDER — NAPROXEN 500 MG PO TABS: 500.0000 mg | ORAL_TABLET | Freq: Two times a day (BID) | ORAL | Status: DC

## 2013-01-14 MED ORDER — METHOCARBAMOL 500 MG PO TABS: 500.0000 mg | ORAL_TABLET | Freq: Two times a day (BID) | ORAL | Status: DC

## 2013-02-16 ENCOUNTER — Ambulatory Visit: Payer: Medicare PPO | Admitting: Physician Assistant

## 2013-02-16 ENCOUNTER — Ambulatory Visit (INDEPENDENT_AMBULATORY_CARE_PROVIDER_SITE_OTHER): Payer: Medicare PPO | Admitting: Physician Assistant

## 2013-02-16 ENCOUNTER — Encounter: Payer: Self-pay | Admitting: Physician Assistant

## 2013-02-16 VITALS — BP 108/76 | HR 96 | Temp 97.7°F | Resp 18 | Ht 66.0 in | Wt 193.0 lb

## 2013-02-16 DIAGNOSIS — R112 Nausea with vomiting, unspecified: Secondary | ICD-10-CM

## 2013-02-16 DIAGNOSIS — J019 Acute sinusitis, unspecified: Secondary | ICD-10-CM | POA: Insufficient documentation

## 2013-02-16 DIAGNOSIS — G43909 Migraine, unspecified, not intractable, without status migrainosus: Secondary | ICD-10-CM

## 2013-02-16 DIAGNOSIS — K644 Residual hemorrhoidal skin tags: Secondary | ICD-10-CM

## 2013-02-16 MED ORDER — AMOXICILLIN-POT CLAVULANATE 875-125 MG PO TABS: 1.0000 | ORAL_TABLET | Freq: Two times a day (BID) | ORAL | Status: DC

## 2013-02-16 MED ORDER — HYDROCORTISONE ACE-PRAMOXINE 1-1 % RE FOAM: 1.0000 | Freq: Two times a day (BID) | RECTAL | Status: DC

## 2013-02-16 MED ORDER — SUMATRIPTAN SUCCINATE 25 MG PO TABS: 25.0000 mg | ORAL_TABLET | Freq: Once | ORAL | Status: DC

## 2013-02-16 NOTE — Assessment & Plan Note (Signed)
Rx Augmentin.  Increase fluid intake.  Saline nasal spray.  Rest.  Daily probiotic.  Zyrtec at bedtime.  Humidifier in bedroom.

## 2013-02-16 NOTE — Assessment & Plan Note (Signed)
Emesis resolved.  Nausea most likely due to anxiety related to mother's passing.  Also likely due to significant postnasal drainage.  Increase fluid intake.  Do not skip meals.  BRAT diet. Continue anxiety medications.

## 2013-02-16 NOTE — Patient Instructions (Signed)
For Sinus infection -- Take Augmentin as prescribed with food until all tablets are gone.  Take a daily probiotic.  Saline nasal spray.  Rest.  Increase fluid intake.  Place a humidifier in the bedroom.  For Hemorrhoids -- Use Proctofoam as directed.  Sit in a bath of warm water at least once daily.  Take a daily probiotic and daily fiber supplement.  If constipated, take a stool softener.  For nausea -- I feel this is anxiety related and may be somewhat related to the sinus infection, as sinus drainage can upset the stomach.  Continue medications as prescribed.  Try to relax and spend time during the day doing an activity you enjoy.   Please return to clinic if symptoms not improving.   Sinusitis Sinusitis is redness, soreness, and swelling (inflammation) of the paranasal sinuses. Paranasal sinuses are air pockets within the bones of your face (beneath the eyes, the middle of the forehead, or above the eyes). In healthy paranasal sinuses, mucus is able to drain out, and air is able to circulate through them by way of your nose. However, when your paranasal sinuses are inflamed, mucus and air can become trapped. This can allow bacteria and other germs to grow and cause infection. Sinusitis can develop quickly and last only a short time (acute) or continue over a long period (chronic). Sinusitis that lasts for more than 12 weeks is considered chronic.  CAUSES  Causes of sinusitis include:  Allergies.  Structural abnormalities, such as displacement of the cartilage that separates your nostrils (deviated septum), which can decrease the air flow through your nose and sinuses and affect sinus drainage.  Functional abnormalities, such as when the small hairs (cilia) that line your sinuses and help remove mucus do not work properly or are not present. SYMPTOMS  Symptoms of acute and chronic sinusitis are the same. The primary symptoms are pain and pressure around the affected sinuses. Other symptoms  include:  Upper toothache.  Earache.  Headache.  Bad breath.  Decreased sense of smell and taste.  A cough, which worsens when you are lying flat.  Fatigue.  Fever.  Thick drainage from your nose, which often is green and may contain pus (purulent).  Swelling and warmth over the affected sinuses. DIAGNOSIS  Your caregiver will perform a physical exam. During the exam, your caregiver may:  Look in your nose for signs of abnormal growths in your nostrils (nasal polyps).  Tap over the affected sinus to check for signs of infection.  View the inside of your sinuses (endoscopy) with a special imaging device with a light attached (endoscope), which is inserted into your sinuses. If your caregiver suspects that you have chronic sinusitis, one or more of the following tests may be recommended:  Allergy tests.  Nasal culture A sample of mucus is taken from your nose and sent to a lab and screened for bacteria.  Nasal cytology A sample of mucus is taken from your nose and examined by your caregiver to determine if your sinusitis is related to an allergy. TREATMENT  Most cases of acute sinusitis are related to a viral infection and will resolve on their own within 10 days. Sometimes medicines are prescribed to help relieve symptoms (pain medicine, decongestants, nasal steroid sprays, or saline sprays).  However, for sinusitis related to a bacterial infection, your caregiver will prescribe antibiotic medicines. These are medicines that will help kill the bacteria causing the infection.  Rarely, sinusitis is caused by a fungal infection. In  theses cases, your caregiver will prescribe antifungal medicine. For some cases of chronic sinusitis, surgery is needed. Generally, these are cases in which sinusitis recurs more than 3 times per year, despite other treatments. HOME CARE INSTRUCTIONS   Drink plenty of water. Water helps thin the mucus so your sinuses can drain more easily.  Use a  humidifier.  Inhale steam 3 to 4 times a day (for example, sit in the bathroom with the shower running).  Apply a warm, moist washcloth to your face 3 to 4 times a day, or as directed by your caregiver.  Use saline nasal sprays to help moisten and clean your sinuses.  Take over-the-counter or prescription medicines for pain, discomfort, or fever only as directed by your caregiver. SEEK IMMEDIATE MEDICAL CARE IF:  You have increasing pain or severe headaches.  You have nausea, vomiting, or drowsiness.  You have swelling around your face.  You have vision problems.  You have a stiff neck.  You have difficulty breathing. MAKE SURE YOU:   Understand these instructions.  Will watch your condition.  Will get help right away if you are not doing well or get worse. Document Released: 03/03/2005 Document Revised: 05/26/2011 Document Reviewed: 03/18/2011 [facility] Patient Information 2014 [place], [place].   Hemorrhoids Hemorrhoids are puffy (swollen) veins around the rectum or anus. Hemorrhoids can cause pain, itching, bleeding, or irritation. HOME CARE  Eat foods with fiber, such as whole grains, beans, nuts, fruits, and vegetables. Ask your doctor about taking products with added fiber in them (fibersupplements).  Drink enough fluid to keep your pee (urine) clear or pale yellow.  Exercise often.  Go to the bathroom when you have the urge to poop. Do not wait.  Avoid straining to poop (bowel movement).  Keep the butt area dry and clean. Use wet toilet paper or moist paper towels.  Medicated creams and medicine inserted into the anus (anal suppository) may be used or applied as told.  Only take medicine as told by your doctor.  Take a warm water bath (sitz bath) for 15 20 minutes to ease pain. Do this 3 4 times a day.  Place ice packs on the area if it is tender or puffy. Use the ice packs between the warm water baths.  Put ice in a plastic bag.  Place a towel  between your skin and the bag.  Leave the ice on for 15-20 minutes, 03-04 times a day.  Do not use a donut-shaped pillow or sit on the toilet for a long time. GET HELP RIGHT AWAY IF:   You have more pain that is not controlled by treatment or medicine.  You have bleeding that will not stop.  You have trouble or are unable to poop (bowel movement).  You have pain or puffiness outside the area of the hemorrhoids. MAKE SURE YOU:   Understand these instructions.  Will watch your condition.  Will get help right away if you are not doing well or get worse. Document Released: 12/11/2007 Document Revised: 02/18/2012 Document Reviewed: 01/13/2012 [facility] Patient Information 2014 [place], [place].

## 2013-02-16 NOTE — Assessment & Plan Note (Signed)
Rx Proctofoam.  Increase fluid intake.  Fiber supplement.  Sitz bath.  Tucks pads.

## 2013-02-16 NOTE — Progress Notes (Signed)
Patient ID: [name], female   DOB: [birth year], 47 y.o.   MRN: [id number]  Patient presents to clinic today with multiple complaints.    Patient c/o sinus pressure, sinus pain, headache, nasal congestion with yellow-green nasal discharge.  Denies fever, chills.  Endorses nausea and an episode of emesis.  Denies sick contact or recent travel.  Patient also complains of pain with bowel movement over the past week.  Has noticed BRBPR on toilet tissue.  Denies melena.  Denies history of anal fissure but endorses history of hemorrhoids requiring surgical removal.  Patient does endorses hx of constipation.  Does not take probiotic or fiber supplement.  Patient also complains of nausea with emetic episode about 4 days ago.  Patient denies abdominal pain or change in bowel habits.  No emesis since.  Her Mother died this past weekend, and patient has had a hard time dealing with it.  Has decreases solid and fluid intake.   Past Medical History  Diagnosis Date  . Dysrhythmia     Left sided heart diease  . Mental disorder   . Bipolar affective disorder, mixed, in partial or remission   . Headache(784.0)   . Anxiety   . Depression   . 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 History of gallstones   . PONV (postoperative nausea and vomiting)   . Sleep apnea   . Renal disorder   . Kidney stones   . Pneumonia     Hx: of as a child  . Migraine     Current Outpatient Prescriptions on File Prior to Visit  Medication Sig Dispense Refill  . hydrOXYzine (VISTARIL) 25 MG capsule Take 25 mg by mouth at bedtime.      Marland Kitchen lithium carbonate (LITHOBID) 300 MG CR tablet Take 300 mg by mouth every evening.       . methocarbamol (ROBAXIN) 500 MG tablet Take 1 tablet (500 mg total) by mouth 2 (two) times daily.  20 tablet  0  . naproxen (NAPROSYN) 500 MG tablet Take 1 tablet (500 mg total) by mouth 2 (two) times daily with a meal.  30 tablet  0  . oxyCODONE (OXY IR/ROXICODONE) 5 MG  immediate release tablet Take 5-10 mg by mouth every 6 (six) hours as needed for pain.       . pregabalin (LYRICA) 75 MG capsule Take 75 mg by mouth every evening.       Marland Kitchen QUEtiapine (SEROQUEL) 400 MG tablet Take 800 mg by mouth at bedtime.       . traZODone (DESYREL) 100 MG tablet Take 400 mg by mouth at bedtime.      Marland Kitchen zolpidem (AMBIEN) 10 MG tablet Take 10 mg by mouth at bedtime as needed for sleep. For sleep       No current facility-administered medications on file prior to visit.    No Known Allergies  Family History  Problem Relation Age of Onset  . Colon cancer Father 38  . Stomach cancer Neg Hx   . Rectal cancer Neg Hx   . Esophageal cancer Neg Hx   . Breast cancer Mother   . Breast cancer Father   . Uterine cancer Mother     History   Social History  . Marital Status: Married    Spouse Name: N/A    Number of Children: 6  . Years of Education: N/A   Occupational History  . nursing student    Social History Main  Topics  . Smoking status: Never Smoker   . Smokeless tobacco: Never Used  . Alcohol Use: No  . Drug Use: No     Comment: see triage note  . Sexual Activity: No   Other Topics Concern  . 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  All other ROS are negative.  Filed Vitals:   02/16/13 1626  BP: 108/76  Pulse: 96  Temp: 97.7 F (36.5 C)  Resp: 18   Physical Exam  Vitals reviewed. Constitutional: She is oriented to person, place, and time.  Anxious caucasian female in no acute distress  HENT:  Head: Normocephalic and atraumatic.  Right Ear: External ear normal.  Left Ear: External ear normal.  Nose: Nose normal.  Mouth/Throat: Oropharynx is clear and  moist. No oropharyngeal exudate.  R TM within normal limits.  L TM occluded by excessive cerumen.  Tenderness to percussion of R maxillary and frontal sinuses.  No tenderness to percussion of L maxillary and frontal sinuses.  Eyes: Conjunctivae are normal.  Neck: Neck supple.  Cardiovascular: Normal rate, regular rhythm, normal heart sounds and intact distal pulses.   Pulmonary/Chest: Effort normal and breath sounds normal. No respiratory distress. She has no wheezes. She has no rales. She exhibits no tenderness.  Lymphadenopathy:    She has no cervical adenopathy.  Neurological: She is alert and oriented to person, place, and time. No cranial nerve deficit.  Skin: Skin is warm and dry. No rash noted.  Psychiatric: Affect normal.   Recent Results (from the past 2160 hour(s))  TB SKIN TEST     Status: Normal   Collection Time    12/02/12  3:50 PM      Result Value Range   TB Skin Test Negative     Induration 0.00    URINALYSIS, ROUTINE W REFLEX MICROSCOPIC     Status: Abnormal   Collection Time    12/07/12  1:53 PM      Result Value Range   Color, Urine YELLOW  YELLOW   APPearance CLOUDY (*) CLEAR   Specific Gravity, Urine 1.016  1.005 - 1.030   pH 7.5  5.0 - 8.0   Glucose, UA NEGATIVE  NEGATIVE mg/dL   Hgb urine dipstick NEGATIVE  NEGATIVE   Bilirubin Urine NEGATIVE  NEGATIVE   Ketones, ur NEGATIVE  NEGATIVE mg/dL   Protein, ur NEGATIVE  NEGATIVE mg/dL   Urobilinogen, UA 0.2  0.0 - 1.0 mg/dL   Nitrite NEGATIVE  NEGATIVE   Leukocytes, UA LARGE (*) NEGATIVE  URINE CULTURE     Status: None   Collection Time    12/07/12  1:53 PM      Result Value Range   Specimen Description URINE, CLEAN CATCH     Special Requests NONE     Culture  Setup Time       Value: 12/07/2012 21:30     Performed at [facility] Count       Value: >=100,000 COLONIES/ML     Performed at [facility]  Value: Multiple bacterial morphotypes present, none  predominant. Suggest appropriate recollection if clinically indicated.     Performed at [facility]   Report Status 12/08/2012 FINAL    URINE MICROSCOPIC-ADD ON     Status: Abnormal   Collection Time    12/07/12  1:53 PM      Result Value Range   Squamous Epithelial / LPF FEW (*) RARE   WBC, UA TOO NUMEROUS TO COUNT  <3 WBC/hpf   RBC / HPF 0-2  <3 RBC/hpf   Bacteria, UA MANY (*) RARE  URINALYSIS, DIPSTICK ONLY     Status: Abnormal   Collection Time    12/25/12  7:06 PM      Result Value Range   Specific Gravity, Urine 1.020  1.005 - 1.030   pH 6.0  5.0 - 8.0   Glucose, UA NEGATIVE  NEGATIVE mg/dL   Hgb urine dipstick NEGATIVE  NEGATIVE   Bilirubin Urine NEGATIVE  NEGATIVE   Ketones, ur NEGATIVE  NEGATIVE mg/dL   Protein, ur NEGATIVE  NEGATIVE mg/dL   Urobilinogen, UA 0.2  0.0 - 1.0 mg/dL   Nitrite NEGATIVE  NEGATIVE   Leukocytes, UA LARGE (*) NEGATIVE  URINE CULTURE     Status: None   Collection Time    12/25/12  7:06 PM      Result Value Range   Specimen Description URINE, CLEAN CATCH     Special Requests none Normal     Culture  Setup Time       Value: 12/26/2012 02:49     Performed at [facility] Count       Value: 80,000 COLONIES/ML     Performed at [facility]   Culture       Value: Multiple bacterial morphotypes present, none predominant. Suggest appropriate recollection if clinically indicated.     Performed at [facility]   Report Status 12/27/2012 FINAL     Assessment/Plan: External hemorrhoids Rx Proctofoam.  Increase fluid intake.  Fiber supplement.  Sitz bath.  Tucks pads.  Acute sinusitis Rx Augmentin.  Increase fluid intake.  Saline nasal spray.  Rest.  Daily probiotic.  Zyrtec at bedtime.  Humidifier in bedroom.  Nausea with vomiting Emesis resolved.  Nausea most likely due to anxiety related to mother's passing.  Also likely due to significant postnasal drainage.  Increase fluid intake.  Do not skip meals.   BRAT diet. Continue anxiety medications.

## 2013-03-08 IMAGING — CR DG ABDOMEN ACUTE W/ 1V CHEST
3 series · 3 of 3 positions shown · non-contrast
Comparison: CT abdomen pelvis without contrast 02/23/2011.

CLINICAL DATA: Vomiting.

ACUTE ABDOMEN SERIES (ABDOMEN 2 VIEW & CHEST 1 VIEW)

[w chest pa]
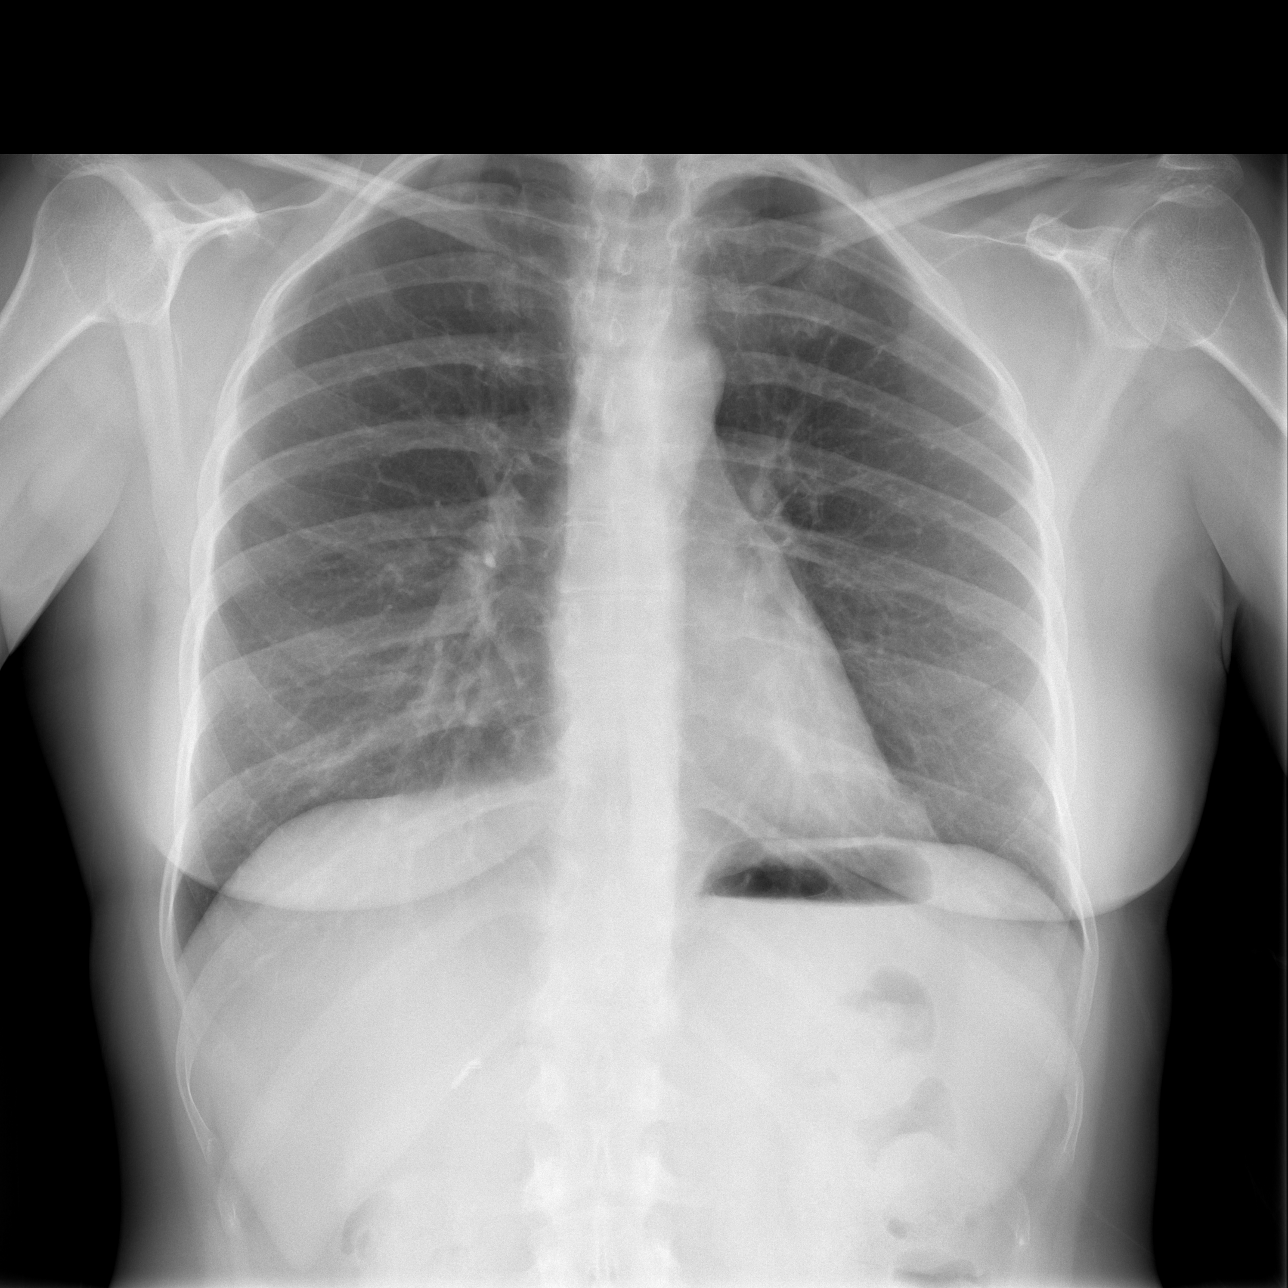

[w abdomen upright]
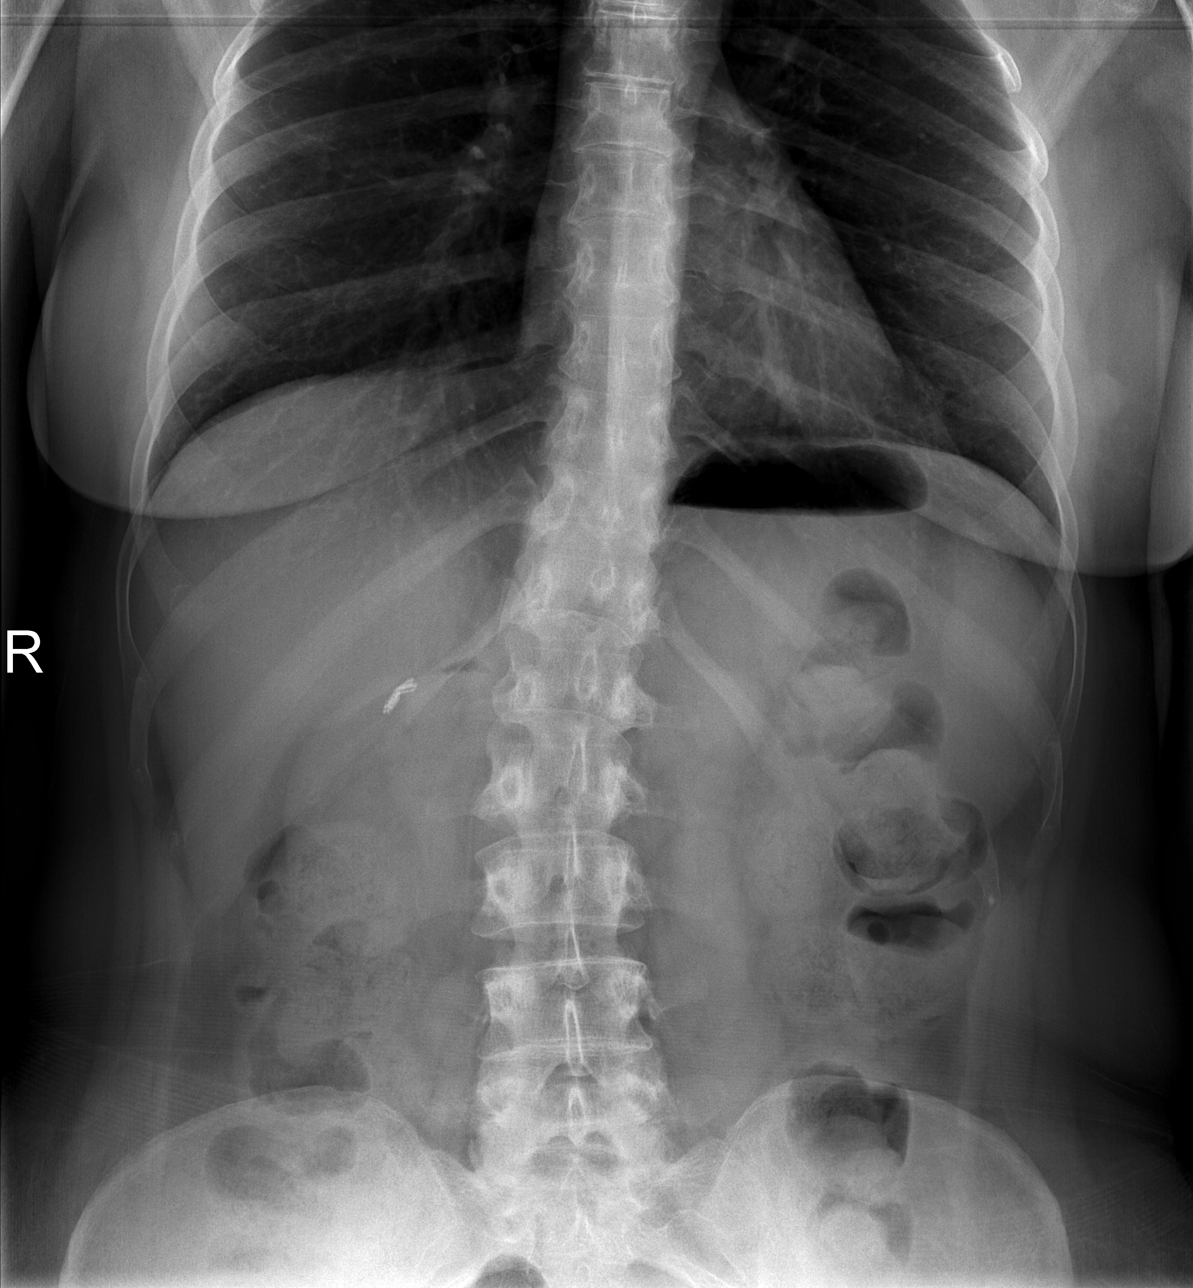

[t abdomen supine]
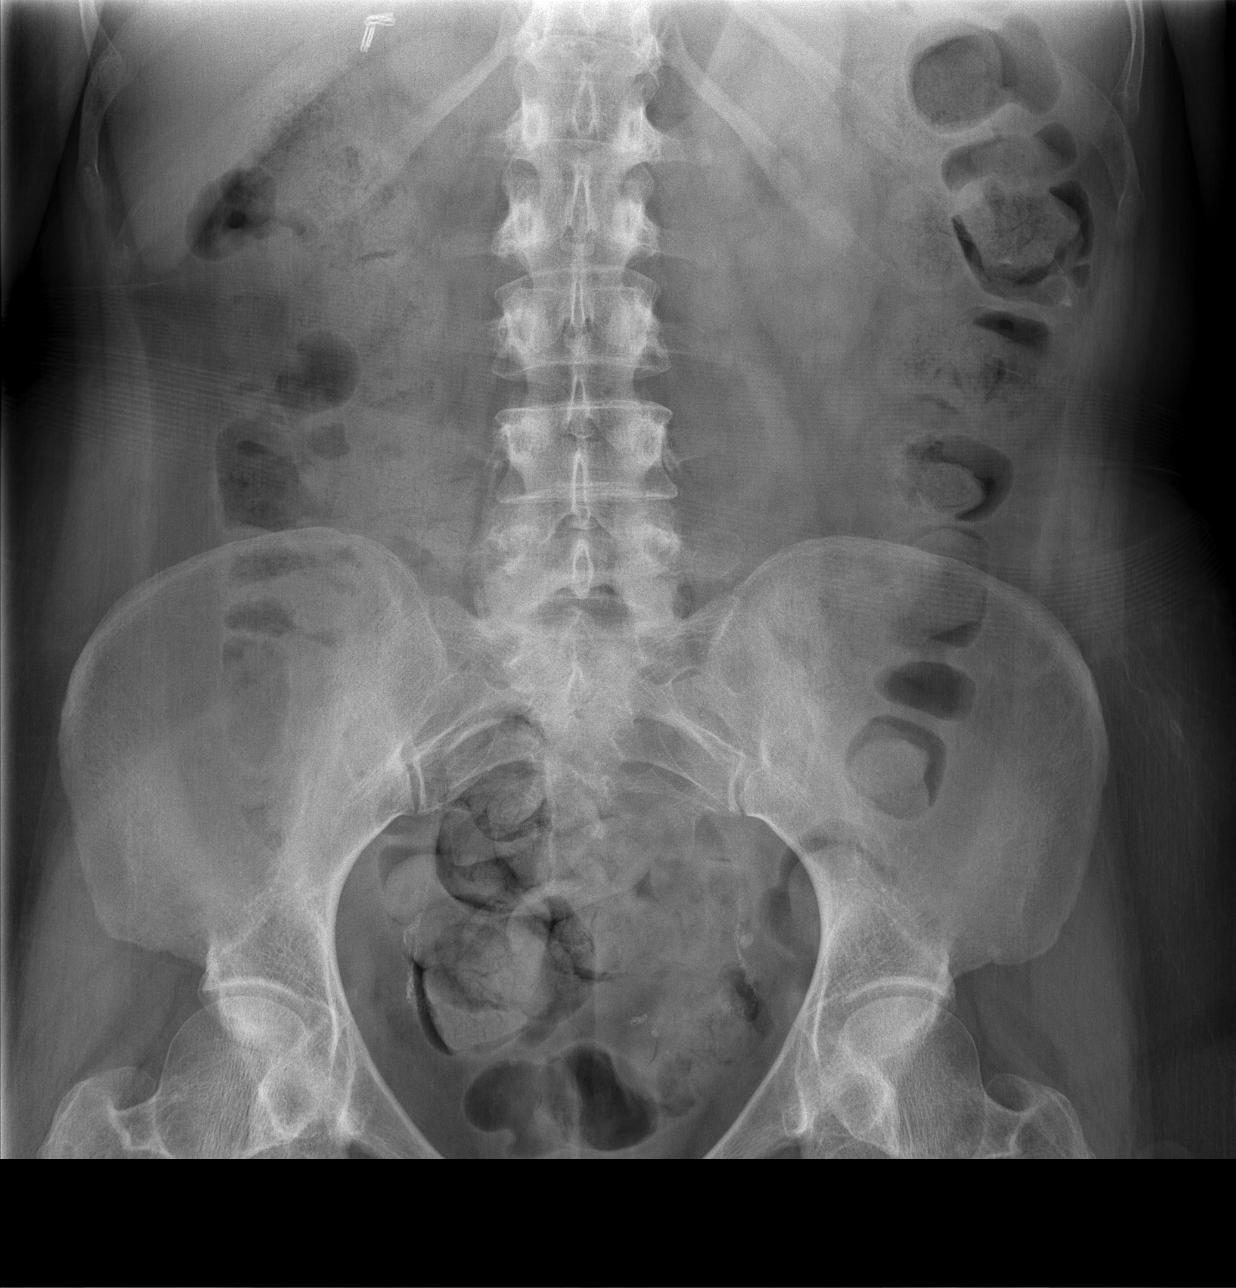

[3 of 3 positions shown; findings below may reference images not displayed]

FINDINGS: Normal heart size with clear lung fields.  No bony
abnormality.  No free air.

Moderate stool burden. No bowel obstruction.  Cholecystectomy.  No
abnormal calcifications.  Bones unremarkable.
IMPRESSION: Negative acute abdomen series.

## 2013-03-08 IMAGING — US US RENAL
1 series · 14 of 25 positions shown · non-contrast
Comparison: CT abdomen pelvis dated 02/23/2011

CLINICAL DATA: Abdominal pain, evaluate for pyelonephritis

RENAL/URINARY TRACT ULTRASOUND COMPLETE

[Series 1: us renal · 0.25mm/px · 14 of 27 slices shown]
[im 1/27]
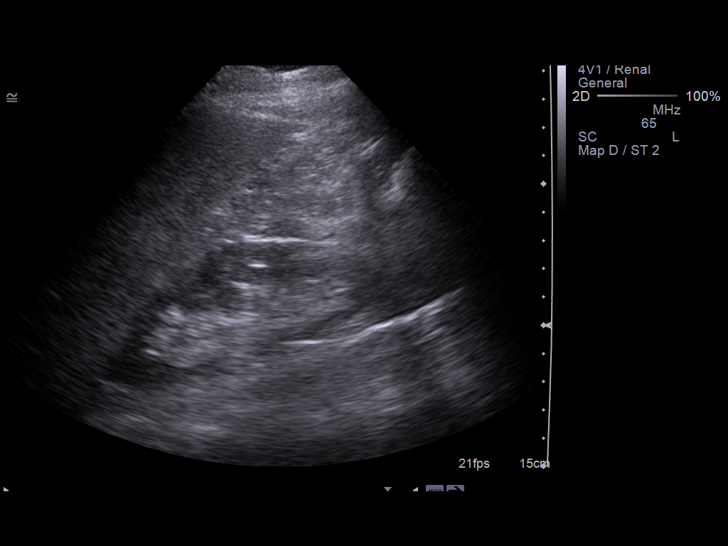
[im 3/27]
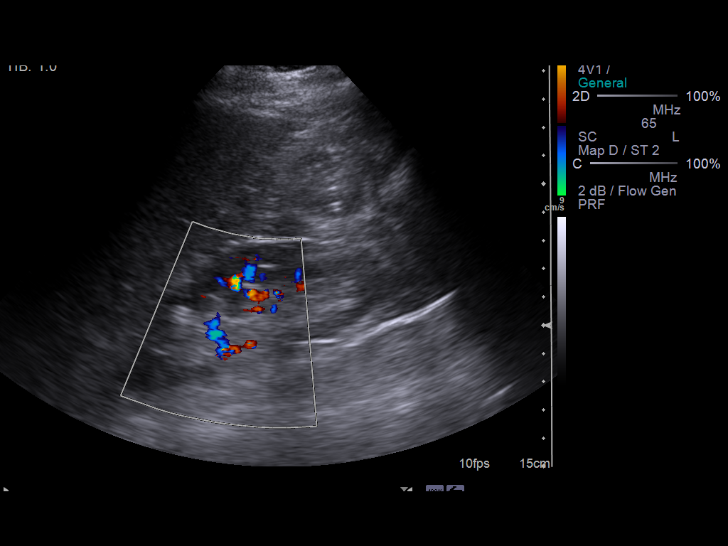
[im 5/27]
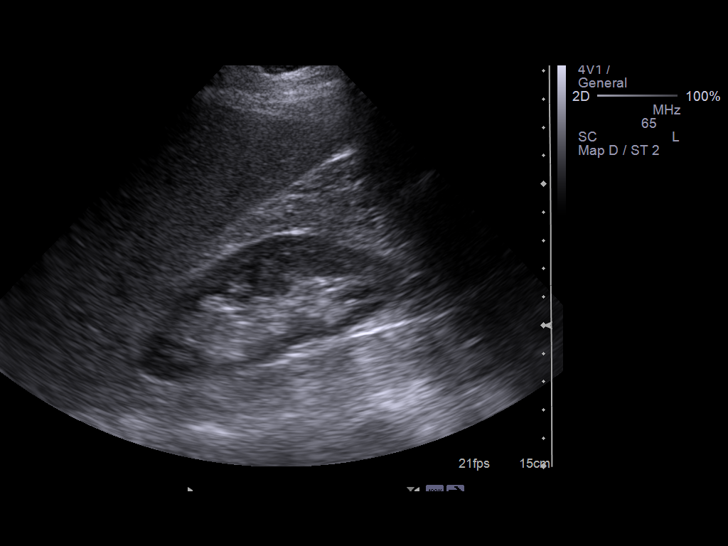
[im 7/27]
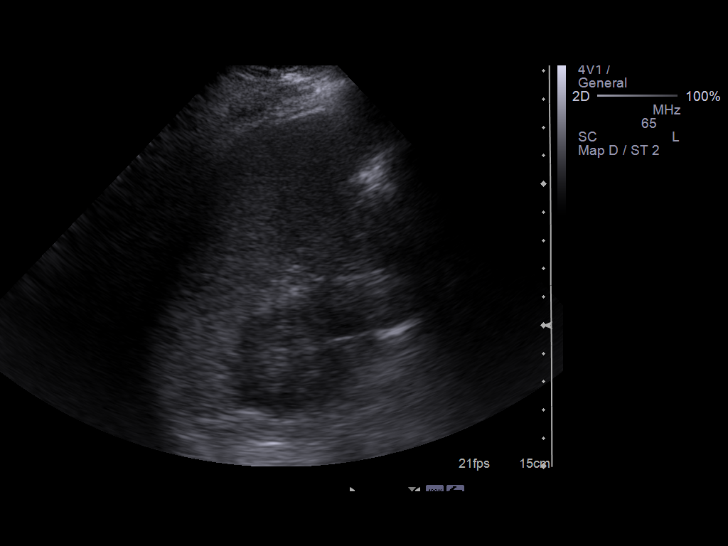
[im 9/27]
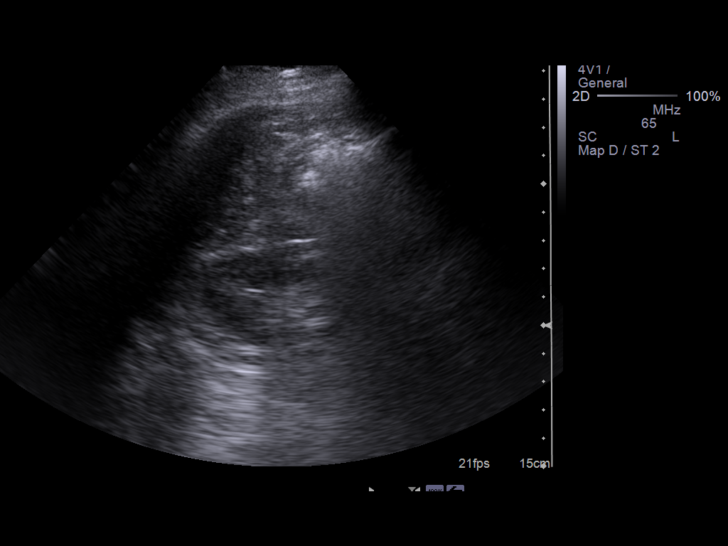
[im 10/27]
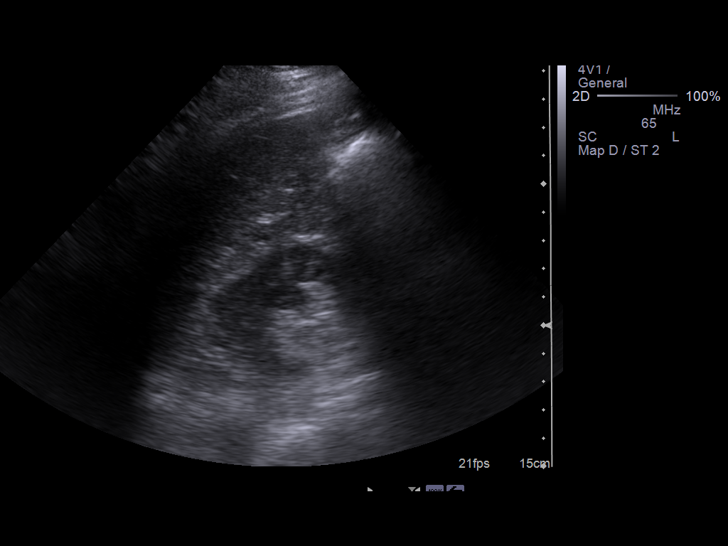
[im 12/27]
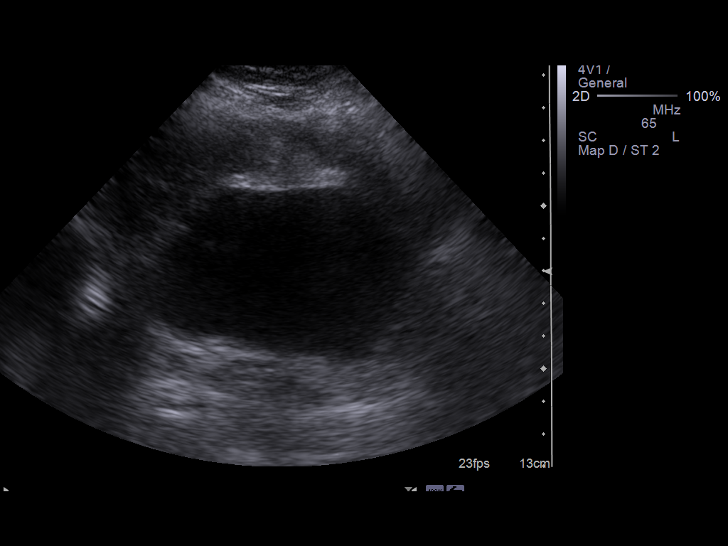
[im 15/27]
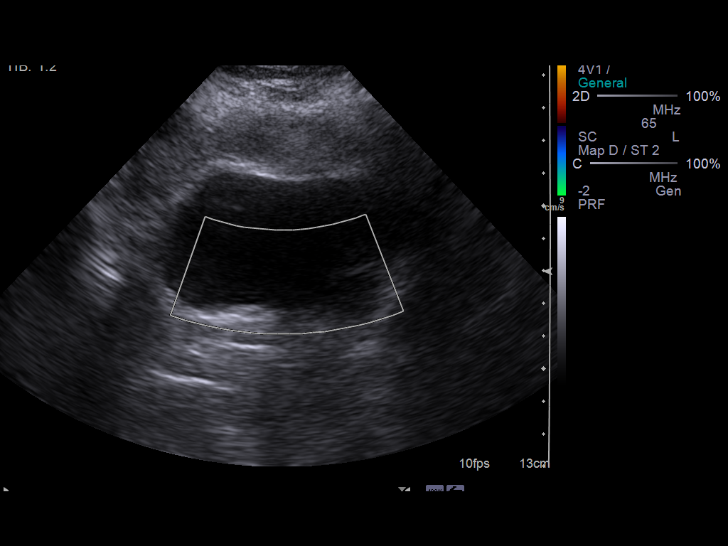
[im 17/27]
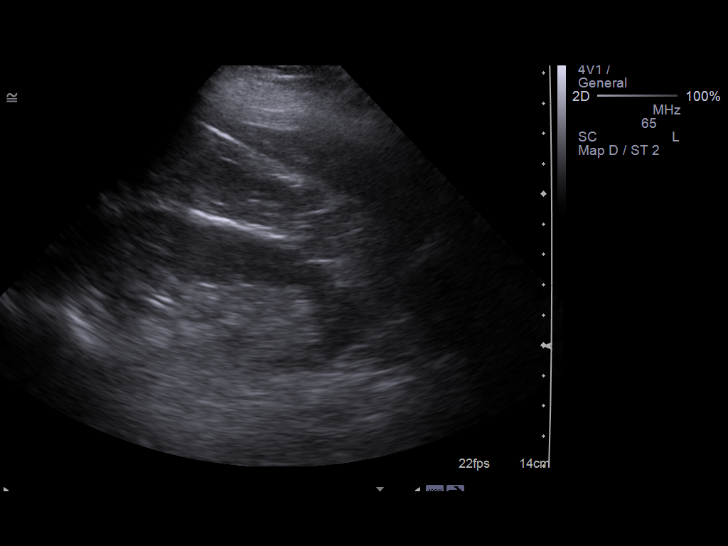
[im 18/27]
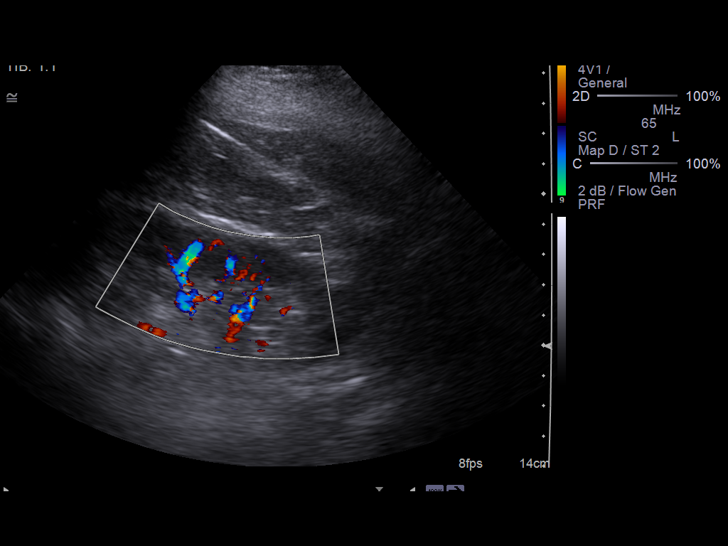
[im 20/27]
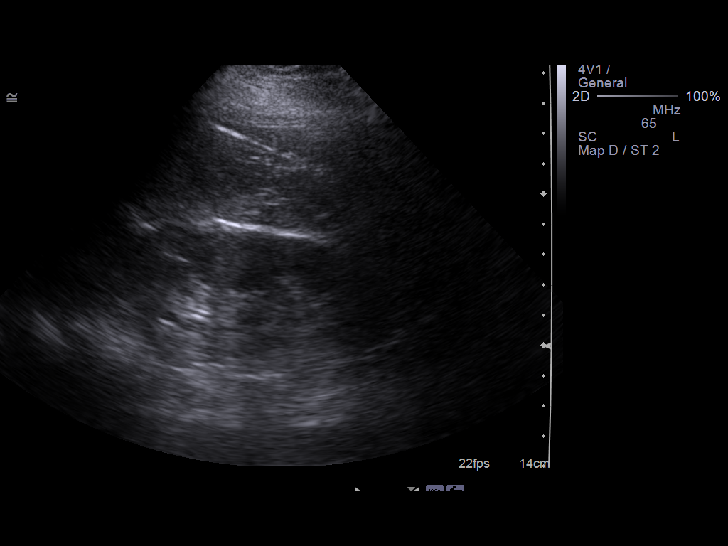
[im 22/27]
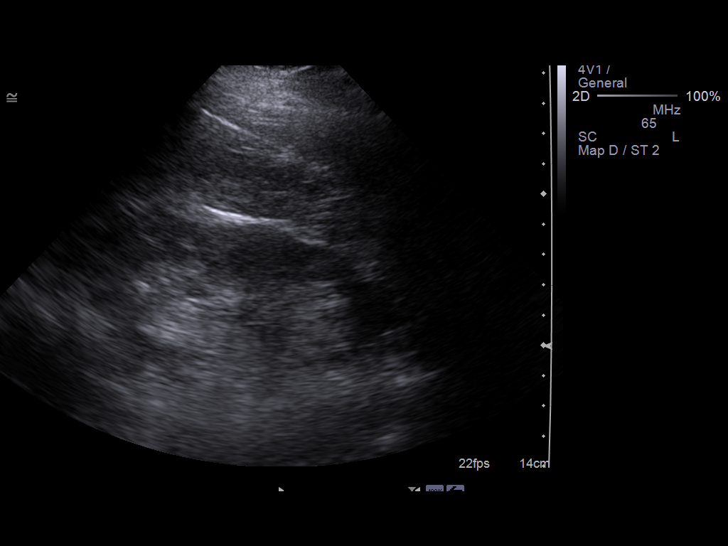
[im 24/27]
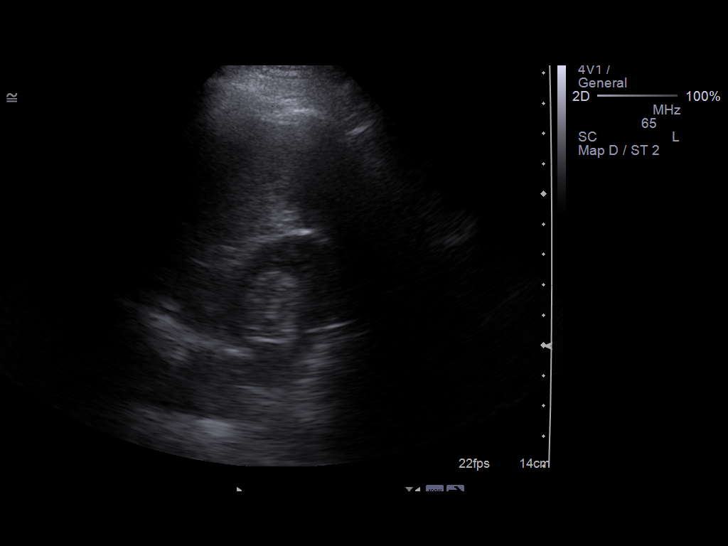
[im 27/27]
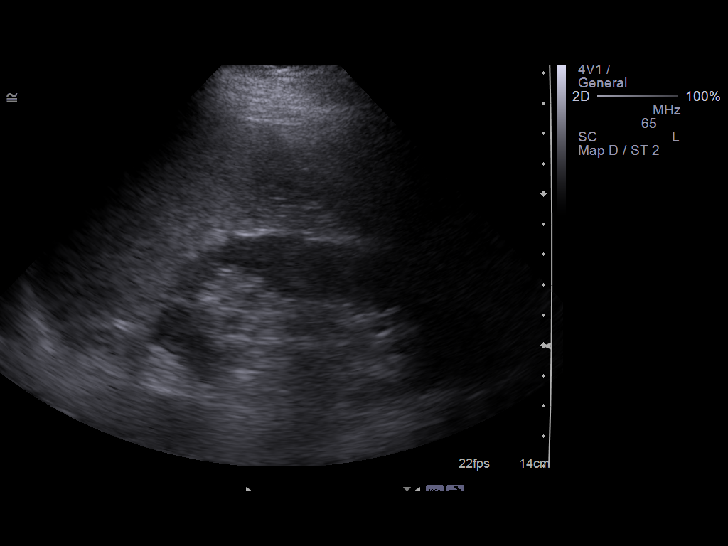

[14 of 25 positions shown; findings below may reference images not displayed]

FINDINGS: Right Kidney:  Measures 10.4 cm.  No mass or hydronephrosis.

Left Kidney:  Measures 11.0 cm.  No mass or hydronephrosis.

Bladder:  Within normal limits.
IMPRESSION: Normal sonographic appearance of the kidneys and bladder.

## 2013-03-09 ENCOUNTER — Encounter (HOSPITAL_COMMUNITY): Payer: Self-pay | Admitting: Emergency Medicine

## 2013-03-09 ENCOUNTER — Emergency Department (HOSPITAL_COMMUNITY): Payer: Medicare PPO

## 2013-03-09 ENCOUNTER — Emergency Department (HOSPITAL_COMMUNITY)
Admission: EM | Admit: 2013-03-09 | Discharge: 2013-03-09 | Disposition: A | Discharge status: Home / Self Care | Payer: Medicare PPO | Attending: Emergency Medicine | Admitting: Emergency Medicine

## 2013-03-09 ENCOUNTER — Ambulatory Visit: Payer: Medicare PPO | Admitting: Physician Assistant

## 2013-03-09 DIAGNOSIS — K5900 Constipation, unspecified: Secondary | ICD-10-CM | POA: Insufficient documentation

## 2013-03-09 DIAGNOSIS — R112 Nausea with vomiting, unspecified: Secondary | ICD-10-CM | POA: Insufficient documentation

## 2013-03-09 DIAGNOSIS — G4730 Sleep apnea, unspecified: Secondary | ICD-10-CM | POA: Insufficient documentation

## 2013-03-09 DIAGNOSIS — Z8701 Personal history of pneumonia (recurrent): Secondary | ICD-10-CM | POA: Insufficient documentation

## 2013-03-09 DIAGNOSIS — Z87442 Personal history of urinary calculi: Secondary | ICD-10-CM | POA: Insufficient documentation

## 2013-03-09 DIAGNOSIS — Z79899 Other long term (current) drug therapy: Secondary | ICD-10-CM | POA: Insufficient documentation

## 2013-03-09 DIAGNOSIS — F3177 Bipolar disorder, in partial remission, most recent episode mixed: Secondary | ICD-10-CM | POA: Insufficient documentation

## 2013-03-09 DIAGNOSIS — Z3202 Encounter for pregnancy test, result negative: Secondary | ICD-10-CM | POA: Insufficient documentation

## 2013-03-09 DIAGNOSIS — G43909 Migraine, unspecified, not intractable, without status migrainosus: Secondary | ICD-10-CM | POA: Insufficient documentation

## 2013-03-09 DIAGNOSIS — F411 Generalized anxiety disorder: Secondary | ICD-10-CM | POA: Insufficient documentation

## 2013-03-09 DIAGNOSIS — R000 Tachycardia, unspecified: Secondary | ICD-10-CM | POA: Insufficient documentation

## 2013-03-09 DIAGNOSIS — Z792 Long term (current) use of antibiotics: Secondary | ICD-10-CM | POA: Insufficient documentation

## 2013-03-09 LAB — TROPONIN I: Troponin I: <0.3 ng/mL (ref <0.30)

## 2013-03-09 LAB — URINALYSIS, ROUTINE W REFLEX MICROSCOPIC
Bilirubin Urine: NEGATIVE
Glucose, UA: NEGATIVE mg/dL
Hgb urine dipstick: NEGATIVE
Ketones, ur: NEGATIVE mg/dL
pH: 6 (ref 5.0–8.0)

## 2013-03-09 LAB — URINE MICROSCOPIC-ADD ON

## 2013-03-09 LAB — PREGNANCY, URINE: Preg Test, Ur: NEGATIVE

## 2013-03-09 LAB — CBC WITH DIFFERENTIAL/PLATELET
Basophils Absolute: 0 10*3/uL (ref 0.0–0.1)
Basophils Relative: 0 % (ref 0–1)
Eosinophils Absolute: 0 10*3/uL (ref 0.0–0.7)
Eosinophils Relative: 0 % (ref 0–5)
HCT: 39.9 % (ref 36.0–46.0)
Lymphocytes Relative: 23 % (ref 12–46)
MCH: 29 pg (ref 26.0–34.0)
MCHC: 34.3 g/dL (ref 30.0–36.0)
Monocytes Absolute: 0.4 10*3/uL (ref 0.1–1.0)
Neutro Abs: 4.2 10*3/uL (ref 1.7–7.7)
Platelets: 159 10*3/uL (ref 150–400)
RDW: 12.6 % (ref 11.5–15.5)
WBC: 5.9 10*3/uL (ref 4.0–10.5)

## 2013-03-09 LAB — COMPREHENSIVE METABOLIC PANEL
ALT: 25 U/L (ref 0–35)
AST: 24 U/L (ref 0–37)
Albumin: 4.4 g/dL (ref 3.5–5.2)
CO2: 27 mEq/L (ref 19–32)
Calcium: 9.8 mg/dL (ref 8.4–10.5)
Chloride: 101 mEq/L (ref 96–112)
Creatinine, Ser: 1.07 mg/dL (ref 0.50–1.10)
Sodium: 139 mEq/L (ref 135–145)
Total Bilirubin: 0.3 mg/dL (ref 0.3–1.2)

## 2013-03-09 LAB — LITHIUM LEVEL: Lithium Lvl: <0.25 mEq/L — ABNORMAL LOW (ref 0.80–1.40)

## 2013-03-09 LAB — LIPASE, BLOOD: Lipase: 36 U/L (ref 11–59)

## 2013-03-09 MED ORDER — SODIUM CHLORIDE 0.9 % IV BOLUS (SEPSIS)
1000.0000 mL | Freq: Once | INTRAVENOUS | Status: AC
  Administered 2013-03-09: 1000 mL via INTRAVENOUS

## 2013-03-09 MED ORDER — PROMETHAZINE HCL 25 MG PO TABS: 25.0000 mg | ORAL_TABLET | Freq: Four times a day (QID) | ORAL | Status: DC | PRN

## 2013-03-09 MED ORDER — POLYETHYLENE GLYCOL 3350 17 G PO PACK: 17.0000 g | PACK | Freq: Every day | ORAL | Status: DC

## 2013-03-09 MED ORDER — PROMETHAZINE HCL 25 MG/ML IJ SOLN
25.0000 mg | Freq: Once | INTRAMUSCULAR | Status: AC
  Administered 2013-03-09: 25 mg via INTRAVENOUS
  Filled 2013-03-09: qty 1

## 2013-03-09 MED ORDER — ONDANSETRON HCL 4 MG/2ML IJ SOLN
4.0000 mg | Freq: Once | INTRAMUSCULAR | Status: AC
  Administered 2013-03-09: 4 mg via INTRAVENOUS
  Filled 2013-03-09: qty 2

## 2013-03-09 NOTE — ED Provider Notes (Signed)
CSN: [id number]     Arrival date & time 03/09/13  1116 History   First MD Initiated Contact with Patient 03/09/13 1145     Chief Complaint  Patient presents with  . Nausea   (Consider location/radiation/quality/duration/timing/severity/associated sxs/prior Treatment) HPI Comments: Patient reports nausea and vomiting that she woke up with this morning. Has had 5 episodes of nonbloody nonbilious emesis since this morning. Denies abdominal pain or diarrhea. No fever. No suspect ingestions. She is on chronic pain medication which she she has been taking as prescribed. Denies any fever, chills, urinary or vaginal symptoms. No chest pain or shortness of breath. She's had a previous cholecystectomy hysterectomy. She's had this happen to her before where she states she needs a "shot of Phenergan" and she feels better.  The history is provided by the patient.    Past Medical History  Diagnosis Date  . Dysrhythmia     Left sided heart diease  . Mental disorder   . Bipolar affective disorder, mixed, in partial or remission   . Headache(784.0)   . Anxiety   . Depression   . 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 History of gallstones   . PONV (postoperative nausea and vomiting)   . Sleep apnea   . Renal disorder   . Kidney stones   . Pneumonia     Hx: of as a child  . Migraine    Past Surgical History  Procedure Laterality Date  . Vaginal hysterectomy    . Cholecystectomy    . Total abdominal hysterectomy w/ bilateral salpingoophorectomy    . Foot surgery      right x 2  . Colonoscopy      2001, 2008.  Father has colon cancer  . Bunionectomy      11/14/2011  . Abdominal hysterectomy    . 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 Colon cancer  Father 52  . Stomach cancer Neg Hx   . Rectal cancer Neg Hx   . Esophageal cancer Neg Hx   . Breast cancer Mother   . Breast cancer Father   . Uterine cancer Mother    History  Substance Use Topics  . Smoking status: Never Smoker   . Smokeless tobacco: Never Used  . Alcohol Use: No   OB History   Grav Para Term Preterm Abortions TAB SAB Ect Mult Living                 Review of Systems  Constitutional: Positive for activity change and appetite change. Negative for fever and fatigue.  HENT: Negative for congestion and rhinorrhea.   Respiratory: Negative for cough, chest tightness and shortness of breath.   Cardiovascular: Negative for chest pain.  Gastrointestinal: Positive for nausea, vomiting and abdominal pain. Negative for diarrhea and constipation.  Genitourinary: Negative for dysuria, hematuria, vaginal bleeding and vaginal discharge.  Musculoskeletal: Negative for back pain.  Skin: Negative for rash.  Neurological: Negative for dizziness, weakness and headaches.  A complete 10 system review of systems was obtained and all systems are negative except as noted in the HPI and PMH.    Allergies  Review of patient's allergies indicates no known allergies.  Home Medications   Current Outpatient Rx  Name  Route  Sig  Dispense  Refill  . hydrOXYzine (VISTARIL) 25 MG capsule   Oral   Take 25 mg by mouth at bedtime.         Marland Kitchen lithium carbonate (LITHOBID) 300 MG CR tablet   Oral   Take 300 mg by mouth every evening.          . methocarbamol (ROBAXIN) 500 MG tablet   Oral   Take 1 tablet (500 mg total) by mouth 2 (two) times daily.   20 tablet   0   . oxyCODONE (OXY IR/ROXICODONE) 5 MG immediate release tablet   Oral   Take 5-10 mg by mouth every 6 (six) hours as needed for pain.          . pregabalin (LYRICA) 75 MG capsule   Oral   Take 75 mg by mouth every evening.          Marland Kitchen QUEtiapine (SEROQUEL) 400 MG tablet   Oral   Take 800 mg by mouth at  bedtime.          . SUMAtriptan (IMITREX) 25 MG tablet   Oral   Take 1 tablet (25 mg total) by mouth once. May repeat in 2 hours if headache persists or recurs.   10 tablet   0   . traZODone (DESYREL) 100 MG tablet   Oral   Take 400 mg by mouth at bedtime.         Marland Kitchen zolpidem (AMBIEN) 10 MG tablet   Oral   Take 10 mg by mouth at bedtime as needed for sleep. For sleep         . amoxicillin-clavulanate (AUGMENTIN) 875-125 MG per tablet   Oral   Take 1 tablet by mouth 2 (two) times daily.   20 tablet   0   . polyethylene glycol (MIRALAX) packet   Oral   Take 17 g by mouth daily.   14 each   0   . promethazine (PHENERGAN) 25 MG tablet   Oral   Take 1 tablet (25 mg total) by mouth every 6 (six) hours as needed for nausea or vomiting.   30 tablet   0    BP 125/68  Pulse 68  Temp(Src) 98.1 F (36.7 C) (Oral)  Resp 20  SpO2 98% Physical Exam  Constitutional: She is oriented to person, place, and time. She appears well-developed and well-nourished. No distress.  HENT:  Head: Normocephalic and atraumatic.  Mouth/Throat: Oropharynx is clear and moist. No oropharyngeal exudate.  Eyes: Conjunctivae and EOM are normal. Pupils are equal, round, and reactive to light.  Neck: Normal range of motion. Neck supple.  Cardiovascular: Normal rate, regular rhythm and normal heart sounds.   Tachycardic  Pulmonary/Chest: Effort normal and breath sounds normal. No respiratory distress.  Abdominal: Soft. There is no tenderness. There is no rebound and no guarding.  No RUQ tenderness  Musculoskeletal: Normal range of motion. She exhibits no edema and no tenderness.  No CVAT  Neurological: She is alert and oriented to person, place, and time. No cranial nerve deficit. She exhibits normal muscle tone. Coordination normal.  Skin: Skin is warm.    ED Course  Procedures (including critical care time) Labs Review Labs Reviewed  COMPREHENSIVE METABOLIC PANEL - Abnormal; Notable for  the following:    Glucose, Bld 119 (*)    GFR calc non Af Amer 61 (*)    GFR calc Af Amer 71 (*)    All other components  within normal limits  URINALYSIS, ROUTINE W REFLEX MICROSCOPIC - Abnormal; Notable for the following:    Leukocytes, UA SMALL (*)    All other components within normal limits  LITHIUM LEVEL - Abnormal; Notable for the following:    Lithium Lvl <0.25 (*)    All other components within normal limits  CBC WITH DIFFERENTIAL  LIPASE, BLOOD  PREGNANCY, URINE  TROPONIN I  URINE MICROSCOPIC-ADD ON   Imaging Review Dg Abd Acute W/chest  03/09/2013   CLINICAL DATA:  Nausea and vomiting  EXAM: ACUTE ABDOMEN SERIES (ABDOMEN 2 VIEW & CHEST 1 VIEW)  COMPARISON:  12/25/2012  FINDINGS: The lungs are clear. Negative for pneumonia or heart failure. No effusion  Negative for bowel obstruction or free air. There is moderate stool in the colon. Surgical clips in the gallbladder fossa. No acute bony abnormality.  IMPRESSION: No active cardiopulmonary disease.  Constipation without bowel obstruction.   Electronically Signed   By: Marlan Palau M.D.   On: 03/09/2013 15:10    EKG Interpretation    Date/Time:  Wednesday March 09 2013 12:09:58 EST Ventricular Rate:  83 PR Interval:  172 QRS Duration: 113 QT Interval:  388 QTC Calculation: 456 R Axis:   9 Text Interpretation:  Sinus rhythm Borderline intraventricular conduction delay Abnormal R-wave progression, early transition No significant change was found Confirmed by Manus Gunning  MD, Meosha Castanon ([id number]) on 03/09/2013 12:50:25 PM            MDM   1. Nausea and vomiting   2. Constipation    Vomiting since this morning without diarrhea or abdominal pain. 5 episodes of nonbilious emesis since this morning. Previous history of same  Tachycardic on arrival. Abdomen soft without peritoneal signs. Previous cholecystectomy.  Labs are unremarkable. Heart rate is improved to the 80s. No longer vomiting in the ED. Abdomen is soft and  nontender. X-ray shows no bowel obstruction. Abdomen is benign.   patient tolerating by mouth liquids. She feels that she is able to go home. Return precautions discussed.  BP 125/68  Pulse 68  Temp(Src) 98.1 F (36.7 C) (Oral)  Resp 20  SpO2 98%    Glynn Octave, MD 03/09/13 1530

## 2013-03-09 NOTE — ED Notes (Signed)
Per pt states nausea the past few days, started vomiting this am-5 x's in 24 hours

## 2013-04-07 ENCOUNTER — Telehealth: Payer: Self-pay | Admitting: Physician Assistant

## 2013-04-07 DIAGNOSIS — IMO0002 Reserved for concepts with insufficient information to code with codable children: Secondary | ICD-10-CM

## 2013-04-07 DIAGNOSIS — M24676 Ankylosis, unspecified foot: Secondary | ICD-10-CM

## 2013-04-07 NOTE — Telephone Encounter (Signed)
Done

## 2013-04-07 NOTE — Telephone Encounter (Signed)
Request referral for [facility] Dr. Sharol Given NPI [id number] Right foot toe removal(great toe) dx code 718.57

## 2013-05-02 ENCOUNTER — Encounter (HOSPITAL_COMMUNITY): Payer: Self-pay | Admitting: Emergency Medicine

## 2013-05-02 ENCOUNTER — Emergency Department (HOSPITAL_COMMUNITY)
Admission: EM | Admit: 2013-05-02 | Discharge: 2013-05-02 | Disposition: A | Discharge status: Home / Self Care | Payer: Medicare PPO | Attending: Emergency Medicine | Admitting: Emergency Medicine

## 2013-05-02 DIAGNOSIS — Z8679 Personal history of other diseases of the circulatory system: Secondary | ICD-10-CM | POA: Insufficient documentation

## 2013-05-02 DIAGNOSIS — F489 Nonpsychotic mental disorder, unspecified: Secondary | ICD-10-CM | POA: Insufficient documentation

## 2013-05-02 DIAGNOSIS — F411 Generalized anxiety disorder: Secondary | ICD-10-CM | POA: Diagnosis not present

## 2013-05-02 DIAGNOSIS — F3177 Bipolar disorder, in partial remission, most recent episode mixed: Secondary | ICD-10-CM | POA: Insufficient documentation

## 2013-05-02 DIAGNOSIS — R519 Headache, unspecified: Secondary | ICD-10-CM

## 2013-05-02 DIAGNOSIS — Z87442 Personal history of urinary calculi: Secondary | ICD-10-CM | POA: Insufficient documentation

## 2013-05-02 DIAGNOSIS — G4730 Sleep apnea, unspecified: Secondary | ICD-10-CM | POA: Insufficient documentation

## 2013-05-02 DIAGNOSIS — G43909 Migraine, unspecified, not intractable, without status migrainosus: Secondary | ICD-10-CM

## 2013-05-02 DIAGNOSIS — Z79899 Other long term (current) drug therapy: Secondary | ICD-10-CM | POA: Insufficient documentation

## 2013-05-02 DIAGNOSIS — Z8719 Personal history of other diseases of the digestive system: Secondary | ICD-10-CM | POA: Diagnosis not present

## 2013-05-02 DIAGNOSIS — Z8701 Personal history of pneumonia (recurrent): Secondary | ICD-10-CM | POA: Insufficient documentation

## 2013-05-02 DIAGNOSIS — R51 Headache: Secondary | ICD-10-CM

## 2013-05-02 MED ORDER — METOCLOPRAMIDE HCL 5 MG/ML IJ SOLN
10.0000 mg | Freq: Once | INTRAMUSCULAR | Status: AC
  Administered 2013-05-02: 10 mg via INTRAMUSCULAR
  Filled 2013-05-02: qty 2

## 2013-05-02 MED ORDER — PROMETHAZINE HCL 25 MG/ML IJ SOLN
25.0000 mg | Freq: Once | INTRAMUSCULAR | Status: AC
  Administered 2013-05-02: 25 mg via INTRAMUSCULAR
  Filled 2013-05-02: qty 1

## 2013-05-02 MED ORDER — ONDANSETRON 8 MG PO TBDP
8.0000 mg | ORAL_TABLET | Freq: Once | ORAL | Status: AC
  Administered 2013-05-02: 8 mg via ORAL
  Filled 2013-05-02: qty 1

## 2013-05-02 MED ORDER — OXYCODONE-ACETAMINOPHEN 5-325 MG PO TABS
2.0000 | ORAL_TABLET | Freq: Once | ORAL | Status: AC
  Administered 2013-05-02: 2 via ORAL
  Filled 2013-05-02: qty 2

## 2013-05-02 MED ORDER — SUMATRIPTAN SUCCINATE 6 MG/0.5ML ~~LOC~~ SOLN
6.0000 mg | Freq: Once | SUBCUTANEOUS | Status: AC
  Administered 2013-05-02: 6 mg via SUBCUTANEOUS
  Filled 2013-05-02: qty 0.5

## 2013-05-02 MED ORDER — HYDROMORPHONE HCL PF 1 MG/ML IJ SOLN
1.0000 mg | Freq: Once | INTRAMUSCULAR | Status: AC
  Administered 2013-05-02: 1 mg via INTRAMUSCULAR
  Filled 2013-05-02: qty 1

## 2013-05-02 NOTE — ED Provider Notes (Signed)
CSN: [id number]     Arrival date & time 05/02/13  1034 History   First MD Initiated Contact with Patient 05/02/13 1116     Chief Complaint  Patient presents with  . Migraine     (Consider location/radiation/quality/duration/timing/severity/associated sxs/prior Treatment) Patient is a 48 y.o. female presenting with migraines. The history is provided by the patient.  Migraine Associated symptoms include headaches. Pertinent negatives include no chest pain, no abdominal pain and no shortness of breath.  pt states hx migraines, c/o onset right frontal headache last pm, consistent prior 'migraine' headaches. Dull. Moderate. Non radiating. Nausea. No vomiting. ?mild photophobia. No phonophobia. No eye pain or change in vision. No numbness/weakness. No neck pain or stiffness. No fainting. Denies sinus drainage/purulent discharge. Denies sore throat, cough or other uri c/o. No recent head trauma. No wt change. States gradual onset, slowly worse, consistent with/same as prior 'migraines'.  No fever or chills.        Past Medical History  Diagnosis Date  . Dysrhythmia     Left sided heart diease  . Mental disorder   . Bipolar affective disorder, mixed, in partial or remission   . Headache(784.0)   . Anxiety   . Depression   . 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 History of gallstones   . PONV (postoperative nausea and vomiting)   . Sleep apnea   . Renal disorder   . Kidney stones   . Pneumonia     Hx: of as a child  . Migraine    Past Surgical History  Procedure Laterality Date  . Vaginal hysterectomy    . Cholecystectomy    . Total abdominal hysterectomy w/ bilateral salpingoophorectomy    . Foot surgery      right x 2  . Colonoscopy      2001, 2008.  Father has colon cancer  . Bunionectomy      11/14/2011  . Abdominal hysterectomy    . 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 Colon cancer Father 26  . Stomach cancer Neg Hx   . Rectal cancer Neg Hx   . Esophageal cancer Neg Hx   . Breast cancer Mother   . Breast cancer Father   . Uterine cancer Mother    History  Substance Use Topics  . Smoking status: Never Smoker   . Smokeless tobacco: Never Used  . Alcohol Use: No   OB History   Grav Para Term Preterm Abortions TAB SAB Ect Mult Living                 Review of Systems  Constitutional: Negative for fever and chills.  HENT: Negative for congestion and sore throat.   Eyes: Negative for pain, redness and visual disturbance.  Respiratory: Negative for shortness of breath.   Cardiovascular: Negative for chest pain.  Gastrointestinal: Negative for vomiting and abdominal pain.  Genitourinary: Negative for flank pain.  Musculoskeletal: Negative for back pain and neck pain.  Skin: Negative for rash.  Neurological: Positive for headaches. Negative for syncope, weakness and numbness.  Hematological: Does not bruise/bleed easily.  Psychiatric/Behavioral: Negative for confusion.      Allergies  Review of patient's allergies indicates no known allergies.  Home Medications  Current Outpatient Rx  Name  Route  Sig  Dispense  Refill  . hydrOXYzine (VISTARIL) 25 MG capsule   Oral   Take 25 mg by mouth at bedtime.         Marland Kitchen lithium carbonate (LITHOBID) 300 MG CR tablet   Oral   Take 300 mg by mouth every evening.          . pregabalin (LYRICA) 75 MG capsule   Oral   Take 75 mg by mouth 2 (two) times daily.          . QUEtiapine (SEROQUEL) 400 MG tablet   Oral   Take 800 mg by mouth at bedtime.          . SUMAtriptan (IMITREX) 25 MG tablet   Oral   Take 1 tablet (25 mg total) by mouth once. May repeat in 2 hours if headache persists or recurs.   10 tablet   0   . traZODone (DESYREL)  100 MG tablet   Oral   Take 400 mg by mouth at bedtime.         Marland Kitchen zolpidem (AMBIEN) 10 MG tablet   Oral   Take 10 mg by mouth at bedtime as needed for sleep. For sleep          BP 110/78  Pulse 97  Temp(Src) 97.7 F (36.5 C) (Oral)  Resp 18  SpO2 98% Physical Exam  Nursing note and vitals reviewed. Constitutional: She is oriented to person, place, and time. She appears well-developed and well-nourished. No distress.  HENT:  Head: Atraumatic.  Nose: Nose normal.  Mouth/Throat: Oropharynx is clear and moist.  No sinus or temporal tenderness.  Eyes: Conjunctivae and EOM are normal. Pupils are equal, round, and reactive to light. No scleral icterus.  Neck: Neck supple. No tracheal deviation present. No thyromegaly present.  No stiffness or rigidity.   Cardiovascular: Normal rate, regular rhythm, normal heart sounds and intact distal pulses.  Exam reveals no gallop and no friction rub.   No murmur heard. Pulmonary/Chest: Effort normal and breath sounds normal. No respiratory distress.  Abdominal: Soft. Normal appearance and bowel sounds are normal. She exhibits no distension. There is no tenderness.  Genitourinary:  No cva tenderness.  Musculoskeletal: Normal range of motion. She exhibits no edema and no tenderness.  Neurological: She is alert and oriented to person, place, and time. No cranial nerve deficit.  Motor intact bilaterally. Steady gait.   Skin: Skin is warm and dry. No rash noted. She is not diaphoretic.  Psychiatric: She has a normal mood and affect.    ED Course  Procedures (including critical care time)   MDM  Pt states has ride, does not have to drive.  reglan im, dilaudid 1 mg im.  Reviewed nursing notes and prior charts for additional history.   Recheck pain improved/resolved.  Pt appears stable for d/c.     Mirna Mires, MD 05/04/13 [phone]

## 2013-05-02 NOTE — ED Notes (Signed)
Per pt, states headache last night, turned into migraine this am-N/V

## 2013-05-02 NOTE — ED Notes (Signed)
Per pt, states she goes to pain clinic-oxycontin for pain issues-took this morning but vomited

## 2013-05-02 NOTE — Discharge Instructions (Signed)
Take your imitrex as need if future headache/migraine. You may also take motrin or aleve as need. Follow up with primary care doctor in the next couple days if symptoms fail to improve/resolve. Return to ER if worse, worsening or severe headache, fevers, persistent vomiting, other concern.  You were given pain medication in the ER - no driving for the next 6 hours.     Migraine Headache A migraine headache is an intense, throbbing pain on one or both sides of your head. A migraine can last for 30 minutes to several hours. CAUSES  The exact cause of a migraine headache is not always known. However, a migraine may be caused when nerves in the brain become irritated and release chemicals that cause inflammation. This causes pain. Certain things may also trigger migraines, such as:  Alcohol.  Smoking.  Stress.  Menstruation.  Aged cheeses.  Foods or drinks that contain nitrates, glutamate, aspartame, or tyramine.  Lack of sleep.  Chocolate.  Caffeine.  Hunger.  Physical exertion.  Fatigue.  Medicines used to treat chest pain (nitroglycerine), birth control pills, estrogen, and some blood pressure medicines. SIGNS AND SYMPTOMS  Pain on one or both sides of your head.  Pulsating or throbbing pain.  Severe pain that prevents daily activities.  Pain that is aggravated by any physical activity.  Nausea, vomiting, or both.  Dizziness.  Pain with exposure to bright lights, loud noises, or activity.  General sensitivity to bright lights, loud noises, or smells. Before you get a migraine, you may get warning signs that a migraine is coming (aura). An aura may include:  Seeing flashing lights.  Seeing bright spots, halos, or zig-zag lines.  Having tunnel vision or blurred vision.  Having feelings of numbness or tingling.  Having trouble talking.  Having muscle weakness. DIAGNOSIS  A migraine headache is often diagnosed based on:  Symptoms.  Physical  exam.  A CT scan or MRI of your head. These imaging tests cannot diagnose migraines, but they can help rule out other causes of headaches. TREATMENT Medicines may be given for pain and nausea. Medicines can also be given to help prevent recurrent migraines.  HOME CARE INSTRUCTIONS  Only take over-the-counter or prescription medicines for pain or discomfort as directed by your health care provider. The use of long-term narcotics is not recommended.  Lie down in a dark, quiet room when you have a migraine.  Keep a journal to find out what may trigger your migraine headaches. For example, write down:  What you eat and drink.  How much sleep you get.  Any change to your diet or medicines.  Limit alcohol consumption.  Quit smoking if you smoke.  Get 7 9 hours of sleep, or as recommended by your health care provider.  Limit stress.  Keep lights dim if bright lights bother you and make your migraines worse. SEEK IMMEDIATE MEDICAL CARE IF:   Your migraine becomes severe.  You have a fever.  You have a stiff neck.  You have vision loss.  You have muscular weakness or loss of muscle control.  You start losing your balance or have trouble walking.  You feel faint or pass out.  You have severe symptoms that are different from your first symptoms. MAKE SURE YOU:   Understand these instructions.  Will watch your condition.  Will get help right away if you are not doing well or get worse. Document Released: 03/03/2005 Document Revised: 12/22/2012 Document Reviewed: 11/08/2012 [facility] Patient Information 2014 [employer].

## 2013-05-03 ENCOUNTER — Emergency Department (HOSPITAL_COMMUNITY)
Admission: EM | Admit: 2013-05-03 | Discharge: 2013-05-03 | Disposition: A | Discharge status: Home / Self Care | Payer: Medicare PPO | Attending: Emergency Medicine | Admitting: Emergency Medicine

## 2013-05-03 ENCOUNTER — Encounter (HOSPITAL_COMMUNITY): Payer: Self-pay | Admitting: Emergency Medicine

## 2013-05-03 DIAGNOSIS — G43909 Migraine, unspecified, not intractable, without status migrainosus: Secondary | ICD-10-CM | POA: Insufficient documentation

## 2013-05-03 DIAGNOSIS — Z8701 Personal history of pneumonia (recurrent): Secondary | ICD-10-CM | POA: Diagnosis not present

## 2013-05-03 DIAGNOSIS — F411 Generalized anxiety disorder: Secondary | ICD-10-CM | POA: Diagnosis not present

## 2013-05-03 DIAGNOSIS — Z87448 Personal history of other diseases of urinary system: Secondary | ICD-10-CM | POA: Diagnosis not present

## 2013-05-03 DIAGNOSIS — Z8719 Personal history of other diseases of the digestive system: Secondary | ICD-10-CM | POA: Insufficient documentation

## 2013-05-03 DIAGNOSIS — Z87442 Personal history of urinary calculi: Secondary | ICD-10-CM | POA: Insufficient documentation

## 2013-05-03 DIAGNOSIS — F489 Nonpsychotic mental disorder, unspecified: Secondary | ICD-10-CM | POA: Insufficient documentation

## 2013-05-03 DIAGNOSIS — F3177 Bipolar disorder, in partial remission, most recent episode mixed: Secondary | ICD-10-CM | POA: Insufficient documentation

## 2013-05-03 DIAGNOSIS — Z79899 Other long term (current) drug therapy: Secondary | ICD-10-CM | POA: Insufficient documentation

## 2013-05-03 MED ORDER — MORPHINE SULFATE 4 MG/ML IJ SOLN
6.0000 mg | Freq: Once | INTRAMUSCULAR | Status: AC
  Administered 2013-05-03: 6 mg via INTRAVENOUS
  Filled 2013-05-03: qty 2

## 2013-05-03 MED ORDER — PROMETHAZINE HCL 25 MG/ML IJ SOLN
25.0000 mg | Freq: Once | INTRAMUSCULAR | Status: AC
  Administered 2013-05-03: 25 mg via INTRAMUSCULAR
  Filled 2013-05-03: qty 1

## 2013-05-03 MED ORDER — ONDANSETRON HCL 4 MG/2ML IJ SOLN
4.0000 mg | Freq: Once | INTRAMUSCULAR | Status: AC
  Administered 2013-05-03: 4 mg via INTRAVENOUS
  Filled 2013-05-03: qty 2

## 2013-05-03 MED ORDER — SODIUM CHLORIDE 0.9 % IV SOLN
1000.0000 mL | Freq: Once | INTRAVENOUS | Status: AC
  Administered 2013-05-03: 1000 mL via INTRAVENOUS

## 2013-05-03 MED ORDER — HYDROMORPHONE HCL PF 1 MG/ML IJ SOLN
1.0000 mg | Freq: Once | INTRAMUSCULAR | Status: AC
  Administered 2013-05-03: 1 mg via INTRAMUSCULAR
  Filled 2013-05-03: qty 1

## 2013-05-03 MED ORDER — SODIUM CHLORIDE 0.9 % IV SOLN: 1000.0000 mL | INTRAVENOUS | Status: DC

## 2013-05-03 MED ORDER — OXYCODONE-ACETAMINOPHEN 5-325 MG PO TABS
1.0000 | ORAL_TABLET | Freq: Once | ORAL | Status: AC
  Administered 2013-05-03: 1 via ORAL
  Filled 2013-05-03: qty 1

## 2013-05-03 MED ORDER — KETOROLAC TROMETHAMINE 60 MG/2ML IM SOLN
60.0000 mg | Freq: Once | INTRAMUSCULAR | Status: AC
  Administered 2013-05-03: 60 mg via INTRAMUSCULAR
  Filled 2013-05-03: qty 2

## 2013-05-03 MED ORDER — PROCHLORPERAZINE EDISYLATE 5 MG/ML IJ SOLN
10.0000 mg | Freq: Once | INTRAMUSCULAR | Status: AC
  Administered 2013-05-03: 10 mg via INTRAVENOUS
  Filled 2013-05-03: qty 2

## 2013-05-03 MED ORDER — KETOROLAC TROMETHAMINE 15 MG/ML IJ SOLN
30.0000 mg | Freq: Once | INTRAMUSCULAR | Status: DC
  Filled 2013-05-03: qty 2

## 2013-05-03 MED ORDER — ONDANSETRON 8 MG PO TBDP: 8.0000 mg | ORAL_TABLET | Freq: Three times a day (TID) | ORAL | Status: DC | PRN

## 2013-05-03 NOTE — ED Notes (Signed)
Per ems pt seen in ED for same yesterday, pt c/o migraine, hx of same. sensitivity to light. Nausea, given 4 mg zofram IM. Reports migraine started yesterday morning around 8 am, d/c from ED yesterday around 1700, vomiting began about 1800, estimates she vomited 20x from last night to this morning. Reports pain at present 9/10

## 2013-05-03 NOTE — ED Notes (Signed)
Pt alert and oriented x4. Respirations even and unlabored, bilateral symmetrical rise and fall of chest. Skin warm and dry. In no acute distress. Denies needs.   

## 2013-05-03 NOTE — ED Notes (Signed)
Pt tolerating PO fluids

## 2013-05-03 NOTE — Discharge Instructions (Signed)
Migraine Headache A migraine headache is an intense, throbbing pain on one or both sides of your head. A migraine can last for 30 minutes to several hours. CAUSES  The exact cause of a migraine headache is not always known. However, a migraine may be caused when nerves in the brain become irritated and release chemicals that cause inflammation. This causes pain. Certain things may also trigger migraines, such as:  Alcohol.  Smoking.  Stress.  Menstruation.  Aged cheeses.  Foods or drinks that contain nitrates, glutamate, aspartame, or tyramine.  Lack of sleep.  Chocolate.  Caffeine.  Hunger.  Physical exertion.  Fatigue.  Medicines used to treat chest pain (nitroglycerine), birth control pills, estrogen, and some blood pressure medicines. SIGNS AND SYMPTOMS  Pain on one or both sides of your head.  Pulsating or throbbing pain.  Severe pain that prevents daily activities.  Pain that is aggravated by any physical activity.  Nausea, vomiting, or both.  Dizziness.  Pain with exposure to bright lights, loud noises, or activity.  General sensitivity to bright lights, loud noises, or smells. Before you get a migraine, you may get warning signs that a migraine is coming (aura). An aura may include:  Seeing flashing lights.  Seeing bright spots, halos, or zig-zag lines.  Having tunnel vision or blurred vision.  Having feelings of numbness or tingling.  Having trouble talking.  Having muscle weakness. DIAGNOSIS  A migraine headache is often diagnosed based on:  Symptoms.  Physical exam.  A CT scan or MRI of your head. These imaging tests cannot diagnose migraines, but they can help rule out other causes of headaches. TREATMENT Medicines may be given for pain and nausea. Medicines can also be given to help prevent recurrent migraines.  HOME CARE INSTRUCTIONS  Only take over-the-counter or prescription medicines for pain or discomfort as directed by your  health care provider. The use of long-term narcotics is not recommended.  Lie down in a dark, quiet room when you have a migraine.  Keep a journal to find out what may trigger your migraine headaches. For example, write down:  What you eat and drink.  How much sleep you get.  Any change to your diet or medicines.  Limit alcohol consumption.  Quit smoking if you smoke.  Get 7 9 hours of sleep, or as recommended by your health care provider.  Limit stress.  Keep lights dim if bright lights bother you and make your migraines worse. SEEK IMMEDIATE MEDICAL CARE IF:   Your migraine becomes severe.  You have a fever.  You have a stiff neck.  You have vision loss.  You have muscular weakness or loss of muscle control.  You start losing your balance or have trouble walking.  You feel faint or pass out.  You have severe symptoms that are different from your first symptoms. MAKE SURE YOU:   Understand these instructions.  Will watch your condition.  Will get help right away if you are not doing well or get worse. Document Released: 03/03/2005 Document Revised: 12/22/2012 Document Reviewed: 11/08/2012 [employer] Patient Information 2014 [employer].  

## 2013-05-03 NOTE — ED Notes (Signed)
rn attempted IV twice, once with ultrasound, charge attempted IV, successful with ultrasound, but then IV infiltrated, md at bedside attempting with ultrasound

## 2013-05-03 NOTE — ED Provider Notes (Signed)
CSN: [id number]     Arrival date & time 05/03/13  5732 History   First MD Initiated Contact with Patient 05/03/13 [id number]     Chief Complaint  Patient presents with  . Migraine      HPI Patient presents emergency Department ongoing headache.  She was seen emergency department yesterday and was feeling better, discharge and she developed recurrence of her headache last night as well as associated nausea vomiting and photophobia.  She states this feels like her prior migraine headaches.  She has oxycodone at home for chronic pain but has been unable to tolerate this secondary to nausea and vomiting.  She denies abdominal pain.  No fevers or chills.  No neck pain.  No sinus complaints.  No changes in her vision.  No hematemesis described.  No diarrhea.  Decreased oral intake   Past Medical History  Diagnosis Date  . Dysrhythmia     Left sided heart diease  . Mental disorder   . Bipolar affective disorder, mixed, in partial or remission   . Headache(784.0)   . Anxiety   . Depression   . 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 History of gallstones   . PONV (postoperative nausea and vomiting)   . Sleep apnea   . Renal disorder   . Kidney stones   . Pneumonia     Hx: of as a child  . Migraine    Past Surgical History  Procedure Laterality Date  . Vaginal hysterectomy    . Cholecystectomy    . Total abdominal hysterectomy w/ bilateral salpingoophorectomy    . Foot surgery      right x 2  . Colonoscopy      2001, 2008.  Father has colon cancer  . Bunionectomy      11/14/2011  . Abdominal hysterectomy    . 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 Colon cancer Father 59  . Stomach cancer Neg Hx   . Rectal cancer Neg Hx   . Esophageal cancer  Neg Hx   . Breast cancer Mother   . Breast cancer Father   . Uterine cancer Mother    History  Substance Use Topics  . Smoking status: Never Smoker   . Smokeless tobacco: Never Used  . Alcohol Use: No   OB History   Grav Para Term Preterm Abortions TAB SAB Ect Mult Living                 Review of Systems  All other systems reviewed and are negative.      Allergies  Review of patient's allergies indicates no known allergies.  Home Medications   Current Outpatient Rx  Name  Route  Sig  Dispense  Refill  . hydrOXYzine (VISTARIL) 25 MG capsule   Oral   Take 25 mg by mouth at bedtime.         Marland Kitchen lithium carbonate (LITHOBID) 300 MG CR tablet   Oral   Take 300 mg by mouth every evening.          Marland Kitchen oxyCODONE (OXY IR/ROXICODONE) 5 MG immediate release tablet   Oral   Take 1 tablet by mouth every 6 (six) hours as needed.         Marland Kitchen  pregabalin (LYRICA) 75 MG capsule   Oral   Take 75 mg by mouth 2 (two) times daily.          . QUEtiapine (SEROQUEL) 400 MG tablet   Oral   Take 800 mg by mouth at bedtime.          . SUMAtriptan (IMITREX) 25 MG tablet   Oral   Take 1 tablet (25 mg total) by mouth once. May repeat in 2 hours if headache persists or recurs.   10 tablet   0   . traZODone (DESYREL) 100 MG tablet   Oral   Take 400 mg by mouth at bedtime.         Marland Kitchen zolpidem (AMBIEN) 10 MG tablet   Oral   Take 10 mg by mouth at bedtime as needed for sleep. For sleep         . ondansetron (ZOFRAN ODT) 8 MG disintegrating tablet   Oral   Take 1 tablet (8 mg total) by mouth every 8 (eight) hours as needed for nausea or vomiting.   10 tablet   0    BP 138/82  Pulse 77  Temp(Src) 98.6 F (37 C) (Oral)  Resp 16  SpO2 96% Physical Exam  Nursing note and vitals reviewed. Constitutional: She is oriented to person, place, and time. She appears well-developed and well-nourished. No distress.  HENT:  Head: Normocephalic and atraumatic.  Eyes: EOM are  normal. Pupils are equal, round, and reactive to light.  Neck: Normal range of motion.  Cardiovascular: Normal rate, regular rhythm and normal heart sounds.   Pulmonary/Chest: Effort normal and breath sounds normal.  Abdominal: Soft. She exhibits no distension. There is no tenderness.  Musculoskeletal: Normal range of motion.  Neurological: She is alert and oriented to person, place, and time.  5/5 strength in major muscle groups of  bilateral upper and lower extremities. Speech normal. No facial asymetry.   Skin: Skin is warm and dry.  Psychiatric: She has a normal mood and affect. Judgment normal.    ED Course  Procedures (including critical care time) Labs Review Labs Reviewed - No data to display Imaging Review No results found.  EKG Interpretation   None       MDM   Final diagnoses:  Migraine headache    Typical migraine headache for the pt. Non focal neuro exam. No recent head trauma. No fever. Doubt meningitis. Doubt intracranial bleed. Doubt normal pressure hydrocephalus. No indication for imaging. Will treat with migraine cocktail and reevaluate   11:34 AM Patient feels much better at this time.  Discharge home in good condition.  PCP followup.    Hoy Morn, MD 05/03/13 [phone]

## 2013-05-03 NOTE — ED Notes (Signed)
Bed: WA04 Expected date:  Expected time:  Means of arrival:  Comments: EMS- Migraine, n/v

## 2013-06-01 ENCOUNTER — Emergency Department (HOSPITAL_COMMUNITY)
Admission: EM | Admit: 2013-06-01 | Discharge: 2013-06-01 | Discharge status: Against Medical Advice | Payer: Medicare PPO

## 2013-06-02 ENCOUNTER — Other Ambulatory Visit: Payer: Self-pay | Admitting: Physician Assistant

## 2013-06-03 NOTE — Telephone Encounter (Signed)
Refill sent per [facility] refill protocol/sls Last BP Readings: 108/76; 124/82 No further refills without office visit.

## 2013-06-30 ENCOUNTER — Emergency Department (HOSPITAL_COMMUNITY): Payer: Medicare HMO

## 2013-06-30 ENCOUNTER — Inpatient Hospital Stay (HOSPITAL_COMMUNITY): Payer: Medicare HMO

## 2013-06-30 ENCOUNTER — Encounter (HOSPITAL_COMMUNITY): Payer: Self-pay | Admitting: Emergency Medicine

## 2013-06-30 ENCOUNTER — Inpatient Hospital Stay (HOSPITAL_COMMUNITY)
Admission: EM | Admit: 2013-06-30 | Discharge: 2013-07-04 | DRG: 871 | Disposition: A | Discharge status: Home / Self Care | Payer: Medicare HMO | Attending: Family Medicine | Admitting: Family Medicine

## 2013-06-30 DIAGNOSIS — J189 Pneumonia, unspecified organism: Secondary | ICD-10-CM | POA: Diagnosis present

## 2013-06-30 DIAGNOSIS — Z8049 Family history of malignant neoplasm of other genital organs: Secondary | ICD-10-CM

## 2013-06-30 DIAGNOSIS — G929 Unspecified toxic encephalopathy: Secondary | ICD-10-CM | POA: Diagnosis present

## 2013-06-30 DIAGNOSIS — Z803 Family history of malignant neoplasm of breast: Secondary | ICD-10-CM

## 2013-06-30 DIAGNOSIS — A419 Sepsis, unspecified organism: Principal | ICD-10-CM | POA: Diagnosis present

## 2013-06-30 DIAGNOSIS — S98119A Complete traumatic amputation of unspecified great toe, initial encounter: Secondary | ICD-10-CM

## 2013-06-30 DIAGNOSIS — Z87442 Personal history of urinary calculi: Secondary | ICD-10-CM

## 2013-06-30 DIAGNOSIS — I498 Other specified cardiac arrhythmias: Secondary | ICD-10-CM | POA: Diagnosis present

## 2013-06-30 DIAGNOSIS — Z800 Family history of malignant neoplasm of digestive organs: Secondary | ICD-10-CM

## 2013-06-30 DIAGNOSIS — G4730 Sleep apnea, unspecified: Secondary | ICD-10-CM | POA: Diagnosis present

## 2013-06-30 DIAGNOSIS — G8929 Other chronic pain: Secondary | ICD-10-CM | POA: Diagnosis present

## 2013-06-30 DIAGNOSIS — R5383 Other fatigue: Secondary | ICD-10-CM

## 2013-06-30 DIAGNOSIS — R6520 Severe sepsis without septic shock: Secondary | ICD-10-CM

## 2013-06-30 DIAGNOSIS — F259 Schizoaffective disorder, unspecified: Secondary | ICD-10-CM | POA: Diagnosis present

## 2013-06-30 DIAGNOSIS — R21 Rash and other nonspecific skin eruption: Secondary | ICD-10-CM | POA: Diagnosis present

## 2013-06-30 DIAGNOSIS — M79609 Pain in unspecified limb: Secondary | ICD-10-CM | POA: Diagnosis present

## 2013-06-30 DIAGNOSIS — F411 Generalized anxiety disorder: Secondary | ICD-10-CM | POA: Diagnosis present

## 2013-06-30 DIAGNOSIS — R4182 Altered mental status, unspecified: Secondary | ICD-10-CM

## 2013-06-30 DIAGNOSIS — Z6832 Body mass index (BMI) 32.0-32.9, adult: Secondary | ICD-10-CM

## 2013-06-30 DIAGNOSIS — N179 Acute kidney failure, unspecified: Secondary | ICD-10-CM | POA: Diagnosis present

## 2013-06-30 DIAGNOSIS — Z9089 Acquired absence of other organs: Secondary | ICD-10-CM

## 2013-06-30 DIAGNOSIS — R091 Pleurisy: Secondary | ICD-10-CM | POA: Diagnosis present

## 2013-06-30 DIAGNOSIS — G92 Toxic encephalopathy: Secondary | ICD-10-CM | POA: Diagnosis present

## 2013-06-30 DIAGNOSIS — R0902 Hypoxemia: Secondary | ICD-10-CM | POA: Diagnosis present

## 2013-06-30 DIAGNOSIS — F319 Bipolar disorder, unspecified: Secondary | ICD-10-CM | POA: Diagnosis present

## 2013-06-30 DIAGNOSIS — Z90710 Acquired absence of both cervix and uterus: Secondary | ICD-10-CM

## 2013-06-30 DIAGNOSIS — Z79899 Other long term (current) drug therapy: Secondary | ICD-10-CM

## 2013-06-30 DIAGNOSIS — G43909 Migraine, unspecified, not intractable, without status migrainosus: Secondary | ICD-10-CM | POA: Diagnosis present

## 2013-06-30 DIAGNOSIS — R5381 Other malaise: Secondary | ICD-10-CM | POA: Diagnosis present

## 2013-06-30 LAB — CBC WITH DIFFERENTIAL/PLATELET
BASOS PCT: 0 % (ref 0–1)
Basophils Absolute: 0 10*3/uL (ref 0.0–0.1)
EOS PCT: 0 % (ref 0–5)
Eosinophils Absolute: 0 10*3/uL (ref 0.0–0.7)
HCT: 41.7 % (ref 36.0–46.0)
HEMOGLOBIN: 14.1 g/dL (ref 12.0–15.0)
LYMPHS PCT: 5 % — AB (ref 12–46)
Lymphs Abs: 0.5 10*3/uL — ABNORMAL LOW (ref 0.7–4.0)
MCH: 28.3 pg (ref 26.0–34.0)
MCHC: 33.8 g/dL (ref 30.0–36.0)
MCV: 83.7 fL (ref 78.0–100.0)
MONOS PCT: 6 % (ref 3–12)
Monocytes Absolute: 0.5 10*3/uL (ref 0.1–1.0)
NEUTROS PCT: 89 % — AB (ref 43–77)
Neutro Abs: 8.1 10*3/uL — ABNORMAL HIGH (ref 1.7–7.7)
PLATELETS: 122 10*3/uL — AB (ref 150–400)
RBC: 4.98 MIL/uL (ref 3.87–5.11)
RDW: 13.3 % (ref 11.5–15.5)
WBC MORPHOLOGY: INCREASED
WBC: 9.1 10*3/uL (ref 4.0–10.5)

## 2013-06-30 LAB — COMPREHENSIVE METABOLIC PANEL
ALBUMIN: 4 g/dL (ref 3.5–5.2)
ALT: 18 U/L (ref 0–35)
AST: 26 U/L (ref 0–37)
Alkaline Phosphatase: 71 U/L (ref 39–117)
BILIRUBIN TOTAL: 0.5 mg/dL (ref 0.3–1.2)
BUN: 14 mg/dL (ref 6–23)
CO2: 26 mEq/L (ref 19–32)
CREATININE: 0.88 mg/dL (ref 0.50–1.10)
Calcium: 9.7 mg/dL (ref 8.4–10.5)
Chloride: 100 mEq/L (ref 96–112)
GFR calc Af Amer: 89 mL/min — ABNORMAL LOW (ref >90)
GFR calc non Af Amer: 77 mL/min — ABNORMAL LOW (ref >90)
Glucose, Bld: 158 mg/dL — ABNORMAL HIGH (ref 70–99)
POTASSIUM: 4.3 meq/L (ref 3.7–5.3)
Sodium: 139 mEq/L (ref 137–147)
Total Protein: 7.8 g/dL (ref 6.0–8.3)

## 2013-06-30 LAB — URINE MICROSCOPIC-ADD ON

## 2013-06-30 LAB — URINALYSIS, ROUTINE W REFLEX MICROSCOPIC
Glucose, UA: NEGATIVE mg/dL
HGB URINE DIPSTICK: NEGATIVE
KETONES UR: NEGATIVE mg/dL
LEUKOCYTES UA: NEGATIVE
Nitrite: NEGATIVE
PH: 5 (ref 5.0–8.0)
PROTEIN: 30 mg/dL — AB
Specific Gravity, Urine: 1.027 (ref 1.005–1.030)
Urobilinogen, UA: 0.2 mg/dL (ref 0.0–1.0)

## 2013-06-30 LAB — I-STAT CG4 LACTIC ACID, ED: Lactic Acid, Venous: 0.79 mmol/L (ref 0.5–2.2)

## 2013-06-30 MED ORDER — DEXTROSE 5 % IV SOLN
1.0000 g | INTRAVENOUS | Status: DC
  Administered 2013-07-01 – 2013-07-02 (×2): 1 g via INTRAVENOUS
  Filled 2013-06-30 (×3): qty 10

## 2013-06-30 MED ORDER — SODIUM CHLORIDE 0.9 % IV SOLN
1000.0000 mL | Freq: Once | INTRAVENOUS | Status: AC
  Administered 2013-06-30: 1000 mL via INTRAVENOUS

## 2013-06-30 MED ORDER — LITHIUM CARBONATE ER 300 MG PO TBCR
300.0000 mg | EXTENDED_RELEASE_TABLET | Freq: Every evening | ORAL | Status: DC
  Administered 2013-07-02 – 2013-07-03 (×2): 300 mg via ORAL
  Filled 2013-06-30 (×6): qty 1

## 2013-06-30 MED ORDER — SODIUM CHLORIDE 0.9 % IV SOLN
INTRAVENOUS | Status: DC
  Administered 2013-06-30: via INTRAVENOUS

## 2013-06-30 MED ORDER — FENTANYL CITRATE 0.05 MG/ML IJ SOLN
100.0000 ug | Freq: Once | INTRAMUSCULAR | Status: AC
  Administered 2013-06-30: 100 ug via INTRAVENOUS
  Filled 2013-06-30: qty 2

## 2013-06-30 MED ORDER — PREGABALIN 75 MG PO CAPS
75.0000 mg | ORAL_CAPSULE | Freq: Two times a day (BID) | ORAL | Status: DC
  Administered 2013-07-01 – 2013-07-03 (×5): 75 mg via ORAL
  Filled 2013-06-30: qty 3
  Filled 2013-06-30: qty 1
  Filled 2013-06-30: qty 3
  Filled 2013-06-30 (×3): qty 1

## 2013-06-30 MED ORDER — DEXTROSE 5 % IV SOLN
500.0000 mg | INTRAVENOUS | Status: DC
  Administered 2013-07-01 – 2013-07-02 (×2): 500 mg via INTRAVENOUS
  Filled 2013-06-30 (×3): qty 500

## 2013-06-30 MED ORDER — HEPARIN SODIUM (PORCINE) 5000 UNIT/ML IJ SOLN
5000.0000 [IU] | Freq: Three times a day (TID) | INTRAMUSCULAR | Status: DC
  Administered 2013-07-01 – 2013-07-02 (×4): 5000 [IU] via SUBCUTANEOUS
  Filled 2013-06-30 (×7): qty 1

## 2013-06-30 MED ORDER — HYDROXYZINE HCL 25 MG PO TABS
25.0000 mg | ORAL_TABLET | Freq: Every day | ORAL | Status: DC
  Filled 2013-06-30 (×2): qty 1

## 2013-06-30 MED ORDER — DEXTROSE 5 % IV SOLN
1.0000 g | Freq: Once | INTRAVENOUS | Status: AC
  Administered 2013-06-30: 1 g via INTRAVENOUS
  Filled 2013-06-30: qty 10

## 2013-06-30 MED ORDER — ZOLPIDEM TARTRATE 10 MG PO TABS: 10.0000 mg | ORAL_TABLET | Freq: Every evening | ORAL | Status: DC | PRN

## 2013-06-30 MED ORDER — DEXTROSE 5 % IV SOLN
500.0000 mg | Freq: Once | INTRAVENOUS | Status: AC
  Administered 2013-06-30: 500 mg via INTRAVENOUS

## 2013-06-30 MED ORDER — TRAZODONE HCL 150 MG PO TABS
400.0000 mg | ORAL_TABLET | Freq: Every day | ORAL | Status: DC
  Administered 2013-07-01 – 2013-07-03 (×3): 400 mg via ORAL
  Filled 2013-06-30 (×4): qty 1
  Filled 2013-06-30: qty 4

## 2013-06-30 MED ORDER — QUETIAPINE FUMARATE 400 MG PO TABS
800.0000 mg | ORAL_TABLET | Freq: Every day | ORAL | Status: DC
  Administered 2013-07-01 – 2013-07-03 (×3): 800 mg via ORAL
  Filled 2013-06-30 (×4): qty 2

## 2013-06-30 MED ORDER — SODIUM CHLORIDE 0.9 % IV SOLN
1000.0000 mL | INTRAVENOUS | Status: DC
  Administered 2013-07-01 (×3): 1000 mL via INTRAVENOUS

## 2013-06-30 MED ORDER — OXYCODONE HCL 5 MG PO TABS
5.0000 mg | ORAL_TABLET | Freq: Four times a day (QID) | ORAL | Status: DC | PRN
  Administered 2013-07-01 (×3): 5 mg via ORAL
  Filled 2013-06-30 (×3): qty 1

## 2013-06-30 NOTE — H&P (Signed)
[facility] History and Physical  [name] DXI:[id number] DOB: [birth year] DOA: 06/30/2013  Referring physician: EDP PCP: Leeanne Rio, PA-C   Chief Complaint: AMS   HPI: [name] is a 48 y.o. female brought from home by husband, for fever, cough, SOB, and AMS.  Cough for last few days, 2 days ago had NBNB vomiting but none since then and no abd pain, no diarrhea.  Today patient developed shaking chills at home with fever and worse cough.  New SOB also developed with mild confusion and generalized weakness.  Has headache but is similar to prior headaches, no meningismus.  Initially on arrival she is lethargic, responsiveness improves dramatically in ED with fluid resuscitation.  Review of Systems: Systems reviewed.  As above, otherwise negative  Past Medical History  Diagnosis Date  . Dysrhythmia     Left sided heart diease  . Mental disorder   . Bipolar affective disorder, mixed, in partial or remission   . Headache(784.0)   . Anxiety   . Depression   . 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 History of gallstones   . PONV (postoperative nausea and vomiting)   . Sleep apnea   . Renal disorder   . Kidney stones   . Pneumonia     Hx: of as a child  . Migraine    Past Surgical History  Procedure Laterality Date  . Vaginal hysterectomy    . Cholecystectomy    . Total abdominal hysterectomy w/ bilateral salpingoophorectomy    . Foot surgery      right x 2  . Colonoscopy      2001, 2008.  Father has colon cancer  . Bunionectomy      11/14/2011  . Abdominal hysterectomy    . 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 She has never used smokeless tobacco. She reports that she does not drink alcohol or use  illicit drugs.  No Known Allergies  Family History  Problem Relation Age of Onset  . Colon cancer Father 21  . Stomach cancer Neg Hx   . Rectal cancer Neg Hx   . Esophageal cancer Neg Hx   . Breast cancer Mother   . Breast cancer Father   . Uterine cancer Mother      Prior to Admission medications   Medication Sig Start Date End Date Taking? Authorizing Provider  hydrOXYzine (VISTARIL) 25 MG capsule Take 25 mg by mouth at bedtime.   Yes Historical Provider, MD  lithium carbonate (LITHOBID) 300 MG CR tablet Take 300 mg by mouth every evening.    Yes Historical Provider, MD  oxyCODONE (OXY IR/ROXICODONE) 5 MG immediate release tablet Take 1 tablet by mouth every 6 (six) hours as needed for moderate pain.  05/01/13  Yes Historical Provider, MD  pregabalin (LYRICA) 75 MG capsule Take 75 mg by mouth 2 (two) times daily.    Yes Historical Provider, MD  QUEtiapine (SEROQUEL) 400 MG tablet Take 800 mg by mouth at bedtime.    Yes Historical Provider, MD  SUMAtriptan (IMITREX) 25 MG tablet TAKE 1 TABLET BY MOUTH ONCE. MAY REPEAT IN 2 HOURS IF HEADACHE PERSISTS OR RECURS.   Yes Leeanne Rio, PA-C  traZODone (DESYREL) 100 MG  tablet Take 400 mg by mouth at bedtime.   Yes Historical Provider, MD  zolpidem (AMBIEN) 10 MG tablet Take 10 mg by mouth at bedtime as needed for sleep. For sleep   Yes Historical Provider, MD   Physical Exam: Filed Vitals:   06/30/13 2339  BP: 129/71  Pulse: 118  Temp: 100.5 F (38.1 C)  Resp: 16    BP 129/71  Pulse 118  Temp(Src) 100.5 F (38.1 C) (Other (Comment))  Resp 16  Wt 90.719 kg (200 lb)  SpO2 95%  General Appearance:    Alert, minimally disoriented, mild respiratory distress, appears stated age  Head:    Normocephalic, atraumatic  Eyes:    PERRL, EOMI, sclera non-icteric        Nose:   Nares without drainage or epistaxis. Mucosa, turbinates normal  Throat:   Moist mucous membranes. Oropharynx without erythema or exudate.  Neck:   Supple.  No carotid bruits.  No thyromegaly.  No lymphadenopathy.   Back:     No CVA tenderness, no spinal tenderness  Lungs:     Bilateral rales, coarse breath sounds  Chest wall:    No tenderness to palpitation  Heart:    Regular rate and rhythm without murmurs, gallops, rubs  Abdomen:     Soft, non-tender, nondistended, normal bowel sounds, no organomegaly  Genitalia:    deferred  Rectal:    deferred  Extremities:   No clubbing, cyanosis or edema.  Pulses:   2+ and symmetric all extremities  Skin:   Small 1 inch square rash on dorsal aspect of foot just proximal to stump of great toe.  Lymph nodes:   Cervical, supraclavicular, and axillary nodes normal  Neurologic:   CNII-XII intact. Normal strength, sensation and reflexes      throughout    Labs on Admission:  Basic Metabolic Panel:  Recent Labs Lab 06/30/13 2055  NA 139  K 4.3  CL 100  CO2 26  GLUCOSE 158*  BUN 14  CREATININE 0.88  CALCIUM 9.7   Liver Function Tests:  Recent Labs Lab 06/30/13 2055  AST 26  ALT 18  ALKPHOS 71  BILITOT 0.5  PROT 7.8  ALBUMIN 4.0   No results found for this basename: LIPASE, AMYLASE,  in the last 168 hours No results found for this basename: AMMONIA,  in the last 168 hours CBC:  Recent Labs Lab 06/30/13 2055  WBC 9.1  NEUTROABS 8.1*  HGB 14.1  HCT 41.7  MCV 83.7  PLT 122*   Cardiac Enzymes: No results found for this basename: CKTOTAL, CKMB, CKMBINDEX, TROPONINI,  in the last 168 hours  BNP (last 3 results) No results found for this basename: PROBNP,  in the last 8760 hours CBG: No results found for this basename: GLUCAP,  in the last 168 hours  Radiological Exams on Admission: Dg Chest Port 1 View  06/30/2013   CLINICAL DATA:  Sepsis  EXAM: PORTABLE CHEST - 1 VIEW  COMPARISON:  DG ABD ACUTE W/CHEST dated 03/09/2013  FINDINGS: There is left lower lobe retrocardiac airspace disease. There is no other focal consolidation, pleural effusion or pneumothorax. The heart and  mediastinal contours are unremarkable.  The osseous structures are unremarkable.  IMPRESSION: Left lower lobe retrocardiac airspace disease which may reflect atelectasis versus pneumonia. Recommend dedicated PA and lateral views of the chest.   Electronically Signed   By: Kathreen Devoid   On: 06/30/2013 22:31    EKG: Independently reviewed.  Assessment/Plan Principal Problem:  Severe sepsis Active Problems:   CAP (community acquired pneumonia)   Hypoxia   Sepsis   Rash   1. Severe sepsis secondary to CAP - patient with PNA on CXR and clinically has PNA with new hypoxia, breath sounds, cough, etc.  Rocephin and azithromycin, pulse ox and tele monitoring, continue IVF.  Currently patients access is limited, she has an IO in place at this time.  Continue IO until alternative access can be established. 2. Rash - patient with small 1 in square rash over dorsum of L foot, this is new as of yesterday according to the patient, concerning this is just proximal to the stump of her L great toe from prior amputation when we admitted her last summer.  Will get X ray of foot to see if there are any signs of osteo here, otherwise plan to just observe this for now, no skin breakdown or ulcer identified, patient already on ABx for #1 above.    Code Status: Full code  Family Communication: Husband at bedside Disposition Plan: Admit to SDU   Time spent: 39 min  [facility] Pager [phone]  If 7AM-7PM, please contact the day team taking care of the patient [email or web address] Password [facility] 06/30/2013, 11:50 PM

## 2013-06-30 NOTE — ED Notes (Signed)
Pt BIB spouse reports that she was well last night. Awoke this morning with a cough, has taken her regular bi-polar and x4, 5mg  Oxycodone medications today. Pt's spouse reports that he came home around 1300 and noticed pt to be lethargic, with slurred speech and confusion, but believed this to be d/t her medications. Pt currently alert and oriented to self and place disoriented to time x1 day and situation. Pt noted to be lethargic at this time and tachycardic.

## 2013-06-30 NOTE — Progress Notes (Addendum)
ANTIBIOTIC CONSULT NOTE - INITIAL  Pharmacy Consult for Rocephin and Zithromax Indication: rule out sepsis, CAP  No Known Allergies  Patient Measurements:    Vital Signs: Temp: 101 F (38.3 C) (04/16 1953) Temp src: Oral (04/16 1953) BP: 134/80 mmHg (04/16 2003) Pulse Rate: 146 (04/16 1953) Intake/Output from previous day:   Intake/Output from this shift:    Labs: No results found for this basename: WBC, HGB, PLT, LABCREA, CREATININE,  in the last 72 hours The CrCl is unknown because both a height and weight (above a minimum accepted value) are required for this calculation. No results found for this basename: VANCOTROUGH, VANCOPEAK, VANCORANDOM, GENTTROUGH, GENTPEAK, GENTRANDOM, TOBRATROUGH, TOBRAPEAK, TOBRARND, AMIKACINPEAK, AMIKACINTROU, AMIKACIN,  in the last 72 hours   Microbiology: No results found for this or any previous visit (from the past 720 hour(s)). 4/16 blood x2: 4/16 urine:  Medical History: Past Medical History  Diagnosis Date  . Dysrhythmia     Left sided heart diease  . Mental disorder   . Bipolar affective disorder, mixed, in partial or remission   . Headache(784.0)   . Anxiety   . Depression   . 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 History of gallstones   . PONV (postoperative nausea and vomiting)   . Sleep apnea   . Renal disorder   . Kidney stones   . Pneumonia     Hx: of as a child  . Migraine     Medications:  Scheduled:   Infusions:  . sodium chloride     Followed by  . sodium chloride     Followed by  . sodium chloride     Followed by  . sodium chloride    . azithromycin    . cefTRIAXone (ROCEPHIN)  IV     Assessment: 48 yo female with bipolar dz presents with lethargy and tachycardia, possibly narcotic induced. Per spouse, pt c/o fever, cough and SOB. Code sepsis called - Pharmacy consulted to dose Rocephin and Zithromax.  Goal of Therapy:  Eradication of infection  Plan:   Continue Rocephin 1g  IV q24h x 7 days  Continue Azithromycin 500mg  IV q24h x 7 days  No dosage adjustments needed or anticipated, so Pharmacy will sign-off. Please re-consult if needed.   Thank you,  Peggyann Juba, PharmD, BCPS Pager: [phone] 06/30/2013,8:17 PM

## 2013-06-30 NOTE — ED Notes (Signed)
Dr. Stevie Kern attempted to place a CVC x2 to R IJ, unsuccessfully. IO to be attempted since visualization of other sites is poor at this time.

## 2013-06-30 NOTE — ED Provider Notes (Signed)
CSN: [id number]     Arrival date & time 06/30/13  1945 History   First MD Initiated Contact with Patient 06/30/13 2000     Chief Complaint  Patient presents with  . Altered Mental Status     (Consider location/radiation/quality/duration/timing/severity/associated sxs/prior Treatment) HPI 48 year old female brought from home by her husband for fever cough shortness of breath and altered mental status, patient has had a cough for the last few days and 2 days ago had a few episodes of nonbloody vomiting but no vomiting since then no abdominal pain no diarrhea, today the patient developed shaking chills at home with fever worse cough new shortness of breath today and mild confusion with generalized weakness today where she cannot sit or stand unassisted and cannot walk due to generalized weakness, she does have a headache today but no stiff neck and headache is like prior headaches that she has had in the past, there is no change in speech or vision or understanding no lateralizing weakness or incoordination just generalized weakness. There is no treatment prior to arrival. She has history of chronic right foot pain and takes chronic oxycodone. Past Medical History  Diagnosis Date  . Dysrhythmia     Left sided heart diease  . Mental disorder   . Bipolar affective disorder, mixed, in partial or remission   . Headache(784.0)   . Anxiety   . Depression   . 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 History of gallstones   . PONV (postoperative nausea and vomiting)   . Sleep apnea   . Renal disorder   . Kidney stones   . Pneumonia     Hx: of as a child  . Migraine    chronic right foot pain Chronic narcotic use Past Surgical History  Procedure Laterality Date  . Vaginal hysterectomy    . Cholecystectomy    . Total abdominal hysterectomy w/ bilateral salpingoophorectomy    . Foot surgery      right x 2  . Colonoscopy      2001, 2008.  Father has colon cancer  .  Bunionectomy      11/14/2011  . Abdominal hysterectomy    . 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 Colon cancer Father 10  . Stomach cancer Neg Hx   . Rectal cancer Neg Hx   . Esophageal cancer Neg Hx   . Breast cancer Mother   . Breast cancer Father   . Uterine cancer Mother    History  Substance Use Topics  . Smoking status: Never Smoker   . Smokeless tobacco: Never Used  . Alcohol Use: No   OB History   Grav Para Term Preterm Abortions TAB SAB Ect Mult Living                 Review of Systems 10 Systems reviewed and are negative for acute change except as noted in the HPI.   Allergies  Review of patient's allergies indicates no known allergies.  Home Medications   Prior to Admission medications   Medication Sig Start Date End Date Taking? Authorizing Provider  hydrOXYzine (VISTARIL) 25 MG capsule Take 25 mg by mouth at bedtime.   Yes Historical Provider, MD  lithium carbonate (LITHOBID)  300 MG CR tablet Take 300 mg by mouth every evening.    Yes Historical Provider, MD  oxyCODONE (OXY IR/ROXICODONE) 5 MG immediate release tablet Take 1 tablet by mouth every 6 (six) hours as needed for moderate pain.  05/01/13  Yes Historical Provider, MD  pregabalin (LYRICA) 75 MG capsule Take 75 mg by mouth 2 (two) times daily.    Yes Historical Provider, MD  QUEtiapine (SEROQUEL) 400 MG tablet Take 800 mg by mouth at bedtime.    Yes Historical Provider, MD  SUMAtriptan (IMITREX) 25 MG tablet TAKE 1 TABLET BY MOUTH ONCE. MAY REPEAT IN 2 HOURS IF HEADACHE PERSISTS OR RECURS.   Yes Piedad Climes, PA-C  traZODone (DESYREL) 100 MG tablet Take 400 mg by mouth at bedtime.   Yes Historical Provider, MD  zolpidem (AMBIEN) 10 MG tablet Take 10 mg by mouth at bedtime as  needed for sleep. For sleep   Yes Historical Provider, MD   BP 124/73  Pulse 106  Temp(Src) 99 F (37.2 C) (Oral)  Resp 14  Ht 5\' 6"  (1.676 m)  Wt 202 lb 6.1 oz (91.8 kg)  BMI 32.68 kg/m2  SpO2 94% Physical Exam  Nursing note and vitals reviewed. Constitutional:  Awake, alert, nontoxic appearance.  HENT:  Head: Atraumatic.  Oral mucosa dry  Eyes: Right eye exhibits no discharge. Left eye exhibits no discharge.  Neck: Neck supple.  Cardiovascular: Regular rhythm.   No murmur heard.  tachycardic  Pulmonary/Chest: No stridor. She is in respiratory distress. She has no wheezes. She has rales. She exhibits no tenderness.  Mild posterior distress, hypoxemia room air 88%, pulse oximetry improves to 93% with nasal oxygen, lung show bibasilar crackles and scattered crackles throughout lung field no wheezes no retractions no accessory muscle usage  Abdominal: Soft. Bowel sounds are normal. She exhibits no distension. There is no tenderness. There is no rebound and no guarding.  Musculoskeletal: She exhibits no edema and no tenderness.  Baseline ROM, no obvious new focal weakness.  Neurological: She is alert.  Awake and alert oriented to person and place but mildly disoriented to time thinks it is Wednesday and today is Thursday; generally weak; no facial asymmetry, normal bilateral finger to nose testing, normal light touch bilaterally in all 4 extremities, 3/5 motor all 4 extremities  Skin: No rash noted.  Psychiatric: She has a normal mood and affect.    ED Course  Procedures (including critical care time) Patient / Family / Caregiver understand and agree with initial ED impression and plan with expectations set for ED visit.  Procedure Attempt: Right IJ Central Line, indication lack of IV access by nursing, emergent procedure attempt, verbal ICG Pt and husband, time-out taken, sterile prep and drape, right IJ visualized with bedside US, local anesthesia with 1% lidocaine 59ml, tiny  right IJ tracks on top of carotid without good side-by-side view, right IJ collapses with each inhalation, initial blood return possibly arterial so local pressure placed, no hematoma formation noted, second attempt blood return definitely arterial, pressure applied locally again, bleeding controled, no hematoma formation, similar [place] appearance of left IJ and both femoral veins and unable to visualize upper arm veins, so decision made to place IO instead; patient tolerated procedure attempt well with no other apparent immediate complications noted; unsuccessful right IJ line placement attempt  Procedure: IO placement right proximal tibia, indication lack of IV access, emergent procedure verbal ICG, time out taken, sterile prep right proximal tibia, EZ-IO one attempt with good blood  aspiration, good flush saline without extravasation, Pt tolerated procedure well with no apparent immediate complications  Pt feels and looks improved after observation and/or treatment in ED awake alert with clear speech, no stridor no drooling no voice change no neck hematoma noted no difficulty swallowing.Patient / Family / Caregiver informed of clinical course, understand medical decision-making process, and agree with plan.  D/w Med for admit.  CRITICAL CARE Performed by: Babette Relic Total critical care time: 62min Critical care time was exclusive of separately billable procedures and treating other patients. Critical care was necessary to treat or prevent imminent or life-threatening deterioration. Critical care was time spent personally by me on the following activities: development of treatment plan with patient and/or surrogate as well as nursing, discussions with consultants, evaluation of patient's response to treatment, examination of patient, obtaining history from patient or surrogate, ordering and performing treatments and interventions, ordering and review of laboratory studies, ordering and review of  radiographic studies, pulse oximetry and re-evaluation of patient's condition.   Labs Review Labs Reviewed  CBC WITH DIFFERENTIAL - Abnormal; Notable for the following:    Platelets 122 (*)    Neutrophils Relative % 89 (*)    Lymphocytes Relative 5 (*)    Neutro Abs 8.1 (*)    Lymphs Abs 0.5 (*)    All other components within normal limits  COMPREHENSIVE METABOLIC PANEL - Abnormal; Notable for the following:    Glucose, Bld 158 (*)    GFR calc non Af Amer 77 (*)    GFR calc Af Amer 89 (*)    All other components within normal limits  URINALYSIS, ROUTINE W REFLEX MICROSCOPIC - Abnormal; Notable for the following:    Color, Urine AMBER (*)    APPearance TURBID (*)    Bilirubin Urine SMALL (*)    Protein, ur 30 (*)    All other components within normal limits  URINE MICROSCOPIC-ADD ON - Abnormal; Notable for the following:    Bacteria, UA MANY (*)    Casts HYALINE CASTS (*)    All other components within normal limits  CBC WITH DIFFERENTIAL - Abnormal; Notable for the following:    RBC 3.55 (*)    Hemoglobin 10.0 (*)    HCT 29.5 (*)    Platelets 74 (*)    All other components within normal limits  COMPREHENSIVE METABOLIC PANEL - Abnormal; Notable for the following:    Potassium 3.5 (*)    Calcium 7.9 (*)    Total Protein 5.4 (*)    Albumin 2.5 (*)    All other components within normal limits  CBC - Abnormal; Notable for the following:    WBC 3.9 (*)    RBC 3.66 (*)    Hemoglobin 10.3 (*)    HCT 29.9 (*)    Platelets 93 (*)    All other components within normal limits  COMPREHENSIVE METABOLIC PANEL - Abnormal; Notable for the following:    Potassium 3.1 (*)    Calcium 8.2 (*)    Total Protein 5.8 (*)    Albumin 2.8 (*)    All other components within normal limits  COMPREHENSIVE METABOLIC PANEL - Abnormal; Notable for the following:    Potassium 3.3 (*)    Albumin 3.0 (*)    GFR calc non Af Amer 77 (*)    GFR calc Af Amer 89 (*)    All other components within  normal limits  CBC - Abnormal; Notable for the following:    Hemoglobin  10.9 (*)    HCT 31.0 (*)    Platelets 126 (*)    All other components within normal limits  CULTURE, BLOOD (ROUTINE X 2)  CULTURE, BLOOD (ROUTINE X 2)  URINE CULTURE  MRSA PCR SCREENING  CULTURE, EXPECTORATED SPUTUM-ASSESSMENT  GRAM STAIN  LEGIONELLA ANTIGEN, URINE  STREP PNEUMONIAE URINARY ANTIGEN  HEPARIN INDUCED THROMBOCYTOPENIA PNL  I-STAT CG4 LACTIC ACID, ED    Imaging Review Dg Chest 2 View  07/03/2013   CLINICAL DATA:  48 year old female with community-acquired pneumonia, hypoxia, sepsis. Initial encounter.  EXAM: CHEST  2 VIEW  COMPARISON:  06/30/2013 and earlier.  FINDINGS: Continued streaky and confluent left base opacity corresponding to the lower lobe on the lateral view. This most resembles bronchopneumonia. No pleural effusion. Lung markings elsewhere within normal limits. Left PICC line now in place. Normal cardiac size and mediastinal contours. Visualized tracheal air column is within normal limits. No acute osseous abnormality identified.  IMPRESSION: Left lower lobe pneumonia. Post treatment radiographs recommended to document resolution.   Electronically Signed   By: Lars Pinks M.D.   On: 07/03/2013 14:36    ECG: Muse not working: sinus tachycardia, ventricular rate 139, left axis deviation, RSR prime in lead V1 and V2, nonspecific diffuse ST and T wave abnormality, no comparison ECG available  MDM   Final diagnoses:  Sepsis  CAP (community acquired pneumonia)  Hypoxia  Altered mental status    The patient appears reasonably stabilized for admission considering the current resources, flow, and capabilities available in the ED at this time, and I doubt any other [facility] requiring further screening and/or treatment in the ED prior to admission.    Babette Relic, MD 07/04/13 2212

## 2013-07-01 ENCOUNTER — Inpatient Hospital Stay (HOSPITAL_COMMUNITY): Payer: Medicare HMO

## 2013-07-01 DIAGNOSIS — A419 Sepsis, unspecified organism: Secondary | ICD-10-CM

## 2013-07-01 LAB — MRSA PCR SCREENING: MRSA by PCR: NEGATIVE

## 2013-07-01 LAB — STREP PNEUMONIAE URINARY ANTIGEN: STREP PNEUMO URINARY ANTIGEN: NEGATIVE

## 2013-07-01 MED ORDER — VANCOMYCIN HCL IN DEXTROSE 1-5 GM/200ML-% IV SOLN
1000.0000 mg | Freq: Three times a day (TID) | INTRAVENOUS | Status: DC
  Administered 2013-07-01 – 2013-07-03 (×5): 1000 mg via INTRAVENOUS
  Filled 2013-07-01 (×7): qty 200

## 2013-07-01 MED ORDER — OXYCODONE HCL 5 MG PO TABS
5.0000 mg | ORAL_TABLET | ORAL | Status: DC | PRN
  Administered 2013-07-01 – 2013-07-04 (×8): 5 mg via ORAL
  Filled 2013-07-01 (×8): qty 1

## 2013-07-01 MED ORDER — MORPHINE SULFATE 2 MG/ML IJ SOLN
1.0000 mg | INTRAMUSCULAR | Status: DC | PRN
  Administered 2013-07-01: 2 mg via INTRAVENOUS
  Filled 2013-07-01: qty 1

## 2013-07-01 MED ORDER — VITAMINS A & D EX OINT
TOPICAL_OINTMENT | CUTANEOUS | Status: AC
  Administered 2013-07-01: 06:00:00
  Filled 2013-07-01: qty 5

## 2013-07-01 MED ORDER — BIOTENE DRY MOUTH MT LIQD
15.0000 mL | Freq: Two times a day (BID) | OROMUCOSAL | Status: DC
  Administered 2013-07-01 – 2013-07-03 (×6): 15 mL via OROMUCOSAL

## 2013-07-01 MED ORDER — CHLORHEXIDINE GLUCONATE 0.12 % MT SOLN
15.0000 mL | Freq: Two times a day (BID) | OROMUCOSAL | Status: DC
  Administered 2013-07-01 – 2013-07-04 (×5): 15 mL via OROMUCOSAL
  Filled 2013-07-01 (×8): qty 15

## 2013-07-01 MED ORDER — ZOLPIDEM TARTRATE 5 MG PO TABS: 5.0000 mg | ORAL_TABLET | Freq: Every evening | ORAL | Status: DC | PRN

## 2013-07-01 MED ORDER — SODIUM CHLORIDE 0.9 % IV SOLN: INTRAVENOUS | Status: AC

## 2013-07-01 MED ORDER — ACETAMINOPHEN 325 MG PO TABS: 650.0000 mg | ORAL_TABLET | Freq: Four times a day (QID) | ORAL | Status: DC | PRN

## 2013-07-01 MED ORDER — SUMATRIPTAN SUCCINATE 25 MG PO TABS
25.0000 mg | ORAL_TABLET | Freq: Once | ORAL | Status: AC
  Administered 2013-07-01: 25 mg via ORAL
  Filled 2013-07-01: qty 1

## 2013-07-01 MED ORDER — TRAMADOL HCL 50 MG PO TABS
50.0000 mg | ORAL_TABLET | Freq: Four times a day (QID) | ORAL | Status: DC | PRN
  Administered 2013-07-01 – 2013-07-03 (×3): 50 mg via ORAL
  Filled 2013-07-01 (×5): qty 1

## 2013-07-01 MED ORDER — PROMETHAZINE HCL 25 MG/ML IJ SOLN
25.0000 mg | Freq: Four times a day (QID) | INTRAMUSCULAR | Status: DC | PRN
  Administered 2013-07-01 – 2013-07-03 (×7): 25 mg via INTRAVENOUS
  Filled 2013-07-01 (×7): qty 1

## 2013-07-01 NOTE — Progress Notes (Signed)
Informed from [facility] that gram positive cocci in chains were growing in the anaerobic blood culture bottle.  Fredirick Maudlin NP notified and pharmacy currently dosing Vancomycin orders.

## 2013-07-01 NOTE — Progress Notes (Signed)
Note: This document was prepared with digital dictation and possible smart phrase technology. Any transcriptional errors that result from this process are unintentional.   [name] GYI:[id number] DOB: 03-08-1966 DOA: 06/30/2013 PCP: Leeanne Rio, PA-C  Brief narrative: 48 y/o known h/o chronic pain [osteomyelitis r foot grt toe-foll by Dr. Duda], R ankle surgery with extesnive debridement 2012on Opiates, Bipolar/schizoaffective disorder on multiple meds, migraines, chronic constipation foll by GI, prior h/o Kidney stones 2012, prior episodes Toxic metabolic encephalopathy thought to be 2/2 to pain meds, asthma admitted overnight 06/30/13 with cough, toxic encephalopathy, fever and found ultimately to have CXr showing PNA and started on Rocephin/Azithromycin  Past medical history-As per Problem list Chart reviewed as below-   Consultants:  none  Procedures:  CXR  Antibiotics:  Rocephin 4/16  Azithromycin 4/16   Subjective  Feels a little better.  Completely oriented.  NO n/v Mild CP No confusion.  Denies overt pain Denies N/v   Objective    Interim History:   Telemetry:    Objective: Filed Vitals:   07/01/13 0330 07/01/13 0400 07/01/13 0500 07/01/13 0600  BP:  135/80 149/80 153/85  Pulse: 111 108 97 120  Temp: 100 F (37.8 C) 100.2 F (37.9 C) 101.3 F (38.5 C) 101.5 F (38.6 C)  TempSrc:  Core (Comment)    Resp: 15 13 13 14   Height:      Weight:      SpO2: 97% 100% 100% 100%    Intake/Output Summary (Last 24 hours) at 07/01/13 0735 Last data filed at 07/01/13 0600  Gross per 24 hour  Intake    690 ml  Output   1450 ml  Net   -760 ml    Exam:  General: EOMI, sleepy Cardiovascular: s1 s2 tachycardic Respiratory:  Clear, no added sound Abdomen: soft NT, ND 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  Data Reviewed: Basic Metabolic Panel:  Recent Labs Lab 06/30/13 2055  NA 139  K 4.3  CL 100  CO2 26  GLUCOSE 158*  BUN 14    CREATININE 0.88  CALCIUM 9.7   Liver Function Tests:  Recent Labs Lab 06/30/13 2055  AST 26  ALT 18  ALKPHOS 71  BILITOT 0.5  PROT 7.8  ALBUMIN 4.0   No results found for this basename: LIPASE, AMYLASE,  in the last 168 hours No results found for this basename: AMMONIA,  in the last 168 hours CBC:  Recent Labs Lab 06/30/13 2055  WBC 9.1  NEUTROABS 8.1*  HGB 14.1  HCT 41.7  MCV 83.7  PLT 122*   Cardiac Enzymes: No results found for this basename: CKTOTAL, CKMB, CKMBINDEX, TROPONINI,  in the last 168 hours BNP: No components found with this basename: POCBNP,  CBG: No results found for this basename: GLUCAP,  in the last 168 hours  Recent Results (from the past 240 hour(s))  MRSA PCR SCREENING     Status: None   Collection Time    07/01/13  1:58 AM      Result Value Ref Range Status   MRSA by PCR NEGATIVE  NEGATIVE Final   Comment:            The GeneXpert MRSA Assay (FDA     approved for NASAL specimens     only), is one component of a     comprehensive MRSA colonization     surveillance program. It is not     intended to diagnose MRSA  infection nor to guide or     monitor treatment for     MRSA infections.     Studies:              All Imaging reviewed and is as per above notation   Scheduled Meds: . sodium chloride   Intravenous STAT  . antiseptic oral rinse  15 mL Mouth Rinse q12n4p  . azithromycin  500 mg Intravenous Q24H  . cefTRIAXone (ROCEPHIN)  IV  1 g Intravenous Q24H  . chlorhexidine  15 mL Mouth Rinse BID  . heparin  5,000 Units Subcutaneous 3 times per day  . hydrOXYzine  25 mg Oral QHS  . lithium carbonate  300 mg Oral QPM  . pregabalin  75 mg Oral BID  . QUEtiapine  800 mg Oral QHS  . traZODone  400 mg Oral QHS   Continuous Infusions: . sodium chloride 1,000 mL (07/01/13 0112)     Assessment/Plan: 1. Severe sepsis 2/2 to CAP-continue Empiric coverage.  CBC + diff am.  Narrow as appropriate in a couple days 2. Sinus  tachycardia-2/2 to severe sepsis.  Monitor on tele.  CP probably from pleurisy and no further w/u planned currently 3. H/o Osteo r foot-Supposed to have surgery this coming Tuesday with Dr. Sharol Given.  Will inform him of her being here. 4. Chronic pain- explained to patient will need to limit her IV opiates as might affect mentation.  Continue PRN oxy only for now 5. Bipolar/shcizoaffective-ContinueLithium 300 qhs, Seroquel 800 qhs, Trazadone 400 qhs, Hydroxyzine will be held 6. H/o migraine.   Code Status: Full Family Communication:  None at bedsdie Disposition Plan: Inpatient   Verneita Griffes, MD  [facility] Pager [phone] 07/01/2013, 7:35 AM    LOS: 1 day

## 2013-07-01 NOTE — Progress Notes (Signed)
CARE MANAGEMENT NOTE 07/01/2013  Patient:  [name], [name]   Account Number:  [id number]  Date Initiated:  07/01/2013  Documentation initiated by:  Fatin Bachicha  Subjective/Objective Assessment:   pt with temp, cough and found to be increasingly lethgaric by husband, fluy versus sepsis     Action/Plan:   home when stable   Anticipated DC Date:  07/04/2013   Anticipated DC Plan:  HOME/SELF CARE  In-house referral  NA      DC Planning Services  NA      [facility] Choice  NA   Choice offered to / List presented to:  NA      DME agency  NA     [place] arranged  NA      [place] agency  NA   Status of service:  In process, will continue to follow Medicare Important Message given?  NO (If response is "NO", the following Medicare IM given date fields will be blank) Date Medicare IM given:   Date Additional Medicare IM given:    Discharge Disposition:    Per UR Regulation:  Reviewed for med. necessity/level of care/duration of stay  If discussed at [place] of Stay Meetings, dates discussed:    Comments:  [id number]/Maleek Craver Eldridge Dace, [place], [place] [phone] Chart Reviewed for discharge and hospital needs. Discharge needs at time of review: None present will follow for needs. Review of patient progress due on [id number].

## 2013-07-01 NOTE — Progress Notes (Signed)
ANTIBIOTIC CONSULT NOTE - INITIAL  Pharmacy Consult for vancomycin Indication: GPC 1/2 blood cultures, fever  No Known Allergies  Patient Measurements: Height: 5\' 6"  (167.6 cm) Weight: 202 lb 6.1 oz (91.8 kg) IBW/kg (Calculated) : 59.3  Vital Signs: Temp: 100.4 F (38 C) (04/17 1830) BP: 144/90 mmHg (04/17 1800) Pulse Rate: 101 (04/17 1830) Intake/Output from previous day: 04/16 0701 - 04/17 0700 In: 825 [I.V.:725] Out: 1580 [Urine:1580] Intake/Output from this shift:    Labs:  Recent Labs  06/30/13 2055  WBC 9.1  HGB 14.1  PLT 122*  CREATININE 0.88   Estimated Creatinine Clearance: 90.2 ml/min (by C-G formula based on Cr of 0.88). No results found for this basename: VANCOTROUGH, Corlis Leak, VANCORANDOM, GENTTROUGH, GENTPEAK, GENTRANDOM, TOBRATROUGH, TOBRAPEAK, TOBRARND, AMIKACINPEAK, AMIKACINTROU, AMIKACIN,  in the last 72 hours   Microbiology: Recent Results (from the past 720 hour(s))  CULTURE, BLOOD (ROUTINE X 2)     Status: None   Collection Time    06/30/13  8:56 PM      Result Value Ref Range Status   Specimen Description BLOOD CENTRAL LINE   Final   Special Requests BOTTLES DRAWN AEROBIC ONLY 4CC   Final   Culture  Setup Time     Final   Value: 07/01/2013 01:20     Performed at [facility]   Culture     Final   Value: GRAM POSITIVE COCCI IN CLUSTERS     17 Note: Gram Stain Report Called to,Read Back By and Verified With: MICHELLE REVES RN 4 22 [id number] EDMOJ     Performed at [facility]   Report Status PENDING   Incomplete  MRSA PCR SCREENING     Status: None   Collection Time    07/01/13  1:58 AM      Result Value Ref Range Status   MRSA by PCR NEGATIVE  NEGATIVE Final   Comment:            The GeneXpert MRSA Assay (FDA     approved for NASAL specimens     only), is one component of a     comprehensive MRSA colonization     surveillance program. It is not     intended to diagnose MRSA     infection nor to guide or     monitor  treatment for     MRSA infections.    Medical History: Past Medical History  Diagnosis Date  . Dysrhythmia     Left sided heart diease  . Mental disorder   . Bipolar affective disorder, mixed, in partial or remission   . Headache(784.0)   . Anxiety   . Depression   . 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 History of gallstones   . PONV (postoperative nausea and vomiting)   . Sleep apnea   . Renal disorder   . Kidney stones   . Pneumonia     Hx: of as a child  . Migraine     Medications:  Scheduled:  . antiseptic oral rinse  15 mL Mouth Rinse q12n4p  . azithromycin  500 mg Intravenous Q24H  . cefTRIAXone (ROCEPHIN)  IV  1 g Intravenous Q24H  . chlorhexidine  15 mL Mouth Rinse BID  . heparin  5,000 Units Subcutaneous 3 times per day  . lithium carbonate  300 mg Oral QPM  . pregabalin  75 mg Oral BID  . QUEtiapine  800 mg Oral QHS  .  SUMAtriptan  25 mg Oral Once  . traZODone  400 mg Oral QHS   Infusions:  . sodium chloride 1,000 mL (07/01/13 8329)   Assessment: 48 yo female presented to ER 4/16 with AMS, fever, cough started on Zithromax and Rocephin for possible sepsis due to CAP. Patient now with fevers and possible bacteremia with 1 blood culture growing GPC in clusters. To start vancomycin per pharmacy dosing. Baseline labs: WBC 9.1, SCr 0.88 with est CrCl 90 ml/min, and Tmax of 101.5 this AM.  Goal of Therapy:  Vancomycin trough level 15-20 mcg/ml  Plan:  1) Vancomycin 1g IV q8 2) Follow up culture results and will check a trough at steady state as needed   Adrian Saran, PharmD, BCPS Pager [phone] 07/01/2013 9:06 PM

## 2013-07-02 LAB — COMPREHENSIVE METABOLIC PANEL
ALBUMIN: 2.5 g/dL — AB (ref 3.5–5.2)
ALT: 15 U/L (ref 0–35)
AST: 14 U/L (ref 0–37)
Alkaline Phosphatase: 51 U/L (ref 39–117)
BUN: 8 mg/dL (ref 6–23)
CALCIUM: 7.9 mg/dL — AB (ref 8.4–10.5)
CO2: 25 mEq/L (ref 19–32)
Chloride: 110 mEq/L (ref 96–112)
Creatinine, Ser: 0.66 mg/dL (ref 0.50–1.10)
GFR calc non Af Amer: >90 mL/min (ref >90)
GLUCOSE: 94 mg/dL (ref 70–99)
Potassium: 3.5 mEq/L — ABNORMAL LOW (ref 3.7–5.3)
SODIUM: 144 meq/L (ref 137–147)
Total Bilirubin: 0.3 mg/dL (ref 0.3–1.2)
Total Protein: 5.4 g/dL — ABNORMAL LOW (ref 6.0–8.3)

## 2013-07-02 LAB — CBC WITH DIFFERENTIAL/PLATELET
BASOS ABS: 0 10*3/uL (ref 0.0–0.1)
Basophils Relative: 0 % (ref 0–1)
EOS ABS: 0 10*3/uL (ref 0.0–0.7)
Eosinophils Relative: 0 % (ref 0–5)
HCT: 29.5 % — ABNORMAL LOW (ref 36.0–46.0)
Hemoglobin: 10 g/dL — ABNORMAL LOW (ref 12.0–15.0)
LYMPHS ABS: 1.1 10*3/uL (ref 0.7–4.0)
Lymphocytes Relative: 21 % (ref 12–46)
MCH: 28.2 pg (ref 26.0–34.0)
MCHC: 33.9 g/dL (ref 30.0–36.0)
MCV: 83.1 fL (ref 78.0–100.0)
Monocytes Absolute: 0.3 10*3/uL (ref 0.1–1.0)
Monocytes Relative: 6 % (ref 3–12)
Neutro Abs: 3.8 10*3/uL (ref 1.7–7.7)
Neutrophils Relative %: 73 % (ref 43–77)
PLATELETS: 74 10*3/uL — AB (ref 150–400)
RBC: 3.55 MIL/uL — ABNORMAL LOW (ref 3.87–5.11)
RDW: 13.2 % (ref 11.5–15.5)
WBC: 5.2 10*3/uL (ref 4.0–10.5)

## 2013-07-02 LAB — LEGIONELLA ANTIGEN, URINE: Legionella Antigen, Urine: NEGATIVE

## 2013-07-02 LAB — URINE CULTURE
CULTURE: NO GROWTH
Colony Count: NO GROWTH

## 2013-07-02 MED ORDER — SUMATRIPTAN SUCCINATE 25 MG PO TABS
25.0000 mg | ORAL_TABLET | ORAL | Status: DC | PRN
  Administered 2013-07-03 – 2013-07-04 (×2): 25 mg via ORAL
  Filled 2013-07-02 (×2): qty 1

## 2013-07-02 MED ORDER — SODIUM CHLORIDE 0.9 % IJ SOLN
10.0000 mL | INTRAMUSCULAR | Status: DC | PRN
  Administered 2013-07-03 – 2013-07-04 (×2): 10 mL

## 2013-07-02 MED ORDER — SODIUM CHLORIDE 0.9 % IV SOLN
1000.0000 mL | INTRAVENOUS | Status: DC
  Administered 2013-07-03: 1000 mL via INTRAVENOUS

## 2013-07-02 NOTE — Progress Notes (Signed)
Note: This document was prepared with digital dictation and possible smart phrase technology. Any transcriptional errors that result from this process are unintentional.   [name] HDQ:[id number] DOB: [birth year] DOA: 06/30/2013 PCP: Leeanne Rio, PA-C  Brief narrative: 48 y/o known h/o chronic pain [osteomyelitis r foot grt toe-foll by Dr. Duda], R ankle surgery with extensive debridement 2012 on Opiates, Bipolar/schizoaffective disorder on multiple meds, migraines, chronic constipation foll by GI, prior h/o Kidney stones 2012, prior episodes Toxic metabolic encephalopathy thought to be 2/2 to pain meds, asthma admitted overnight 06/30/13 with cough, toxic encephalopathy, fever and found ultimately to have CXr showing PNA and started on Rocephin/Azithromycin Gram + cocci growing in 1/2 bottles reported 4/17  Past medical history-As per Problem list Chart reviewed as below-   Consultants:  none  Procedures:  CXR  Antibiotics:  Rocephin 4/16  Azithromycin 4/16  Vancomycin 4/16   Subjective  Feels a little better.  Not tol po as had bad headaches yesterday 2/2 to migraine sand felt nauseous and 1 episode vomit After PICC placement NO further vomit.  No chills no fever no cp no sob. Requesting imitrex be re-added   Objective    Interim History:   Telemetry:    Objective: Filed Vitals:   07/01/13 2200 07/01/13 2300 07/02/13 0000 07/02/13 0400  BP: 123/76 106/62 88/52 88/51   Pulse: 89 94 110 86  Temp: 100 F (37.8 C) 99.7 F (37.6 C) 99 F (37.2 C) 98.2 F (36.8 C)  TempSrc:      Resp: 13 13 11 12   Height:      Weight:      SpO2: 98% 97% 96% 98%    Intake/Output Summary (Last 24 hours) at 07/02/13 0749 Last data filed at 07/02/13 0600  Gross per 24 hour  Intake   3575 ml  Output    390 ml  Net   3185 ml    Exam:  General: EOMI, sleepy Cardiovascular: s1 s2 tachycardic Respiratory:  Clear, no added sound, decreased AE  bilaterally Abdomen: soft NT, ND 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  Data Reviewed: Basic Metabolic Panel:  Recent Labs Lab 06/30/13 2055 07/02/13 0515  NA 139 144  K 4.3 3.5*  CL 100 110  CO2 26 25  GLUCOSE 158* 94  BUN 14 8  CREATININE 0.88 0.66  CALCIUM 9.7 7.9*   Liver Function Tests:  Recent Labs Lab 06/30/13 2055 07/02/13 0515  AST 26 14  ALT 18 15  ALKPHOS 71 51  BILITOT 0.5 0.3  PROT 7.8 5.4*  ALBUMIN 4.0 2.5*   No results found for this basename: LIPASE, AMYLASE,  in the last 168 hours No results found for this basename: AMMONIA,  in the last 168 hours CBC:  Recent Labs Lab 06/30/13 2055 07/02/13 0515  WBC 9.1 5.2  NEUTROABS 8.1* 3.8  HGB 14.1 10.0*  HCT 41.7 29.5*  MCV 83.7 83.1  PLT 122* 74*   Cardiac Enzymes: No results found for this basename: CKTOTAL, CKMB, CKMBINDEX, TROPONINI,  in the last 168 hours BNP: No components found with this basename: POCBNP,  CBG: No results found for this basename: GLUCAP,  in the last 168 hours  Recent Results (from the past 240 hour(s))  CULTURE, BLOOD (ROUTINE X 2)     Status: None   Collection Time    06/30/13  8:56 PM      Result Value Ref Range Status   Specimen Description BLOOD CENTRAL LINE  Final   Special Requests BOTTLES DRAWN AEROBIC ONLY 4CC   Final   Culture  Setup Time     Final   Value: 07/01/2013 01:20     Performed at [facility]   Culture     Final   Value: GRAM POSITIVE COCCI IN CLUSTERS     17 Note: Gram Stain Report Called to,Read Back By and Verified With: MICHELLE REVES RN 4 30 845P EDMOJ     Performed at [facility]   Report Status PENDING   Incomplete  CULTURE, BLOOD (ROUTINE X 2)     Status: None   Collection Time    06/30/13  9:45 PM      Result Value Ref Range Status   Specimen Description BLOOD BLOOD RIGHT WRIST   Final   Special Requests BOTTLES DRAWN AEROBIC AND ANAEROBIC 5CC   Final   Culture  Setup Time     Final   Value: 07/01/2013 01:20      Performed at [facility]   Culture     Final   Value: GRAM POSITIVE COCCI IN CLUSTERS     0335 Note: Gram Stain Report Called to,Read Back By and Verified With: MICHELLE REVES 07/02/13 [facility]     Performed at [facility]   Report Status PENDING   Incomplete  URINE CULTURE     Status: None   Collection Time    06/30/13 10:38 PM      Result Value Ref Range Status   Specimen Description URINE, CATHETERIZED   Final   Special Requests NONE   Final   Culture  Setup Time     Final   Value: 07/01/2013 01:25     Performed at [place]     Final   Value: NO GROWTH     Performed at [facility]   Culture     Final   Value: NO GROWTH     Performed at [facility]   Report Status 07/02/2013 FINAL   Final  MRSA PCR SCREENING     Status: None   Collection Time    07/01/13  1:58 AM      Result Value Ref Range Status   MRSA by PCR NEGATIVE  NEGATIVE Final   Comment:            The GeneXpert MRSA Assay (FDA     approved for NASAL specimens     only), is one component of a     comprehensive MRSA colonization     surveillance program. It is not     intended to diagnose MRSA     infection nor to guide or     monitor treatment for     MRSA infections.     Studies:              All Imaging reviewed and is as per above notation   Scheduled Meds: . antiseptic oral rinse  15 mL Mouth Rinse q12n4p  . azithromycin  500 mg Intravenous Q24H  . cefTRIAXone (ROCEPHIN)  IV  1 g Intravenous Q24H  . chlorhexidine  15 mL Mouth Rinse BID  . lithium carbonate  300 mg Oral QPM  . pregabalin  75 mg Oral BID  . QUEtiapine  800 mg Oral QHS  . traZODone  400 mg Oral QHS  . vancomycin  1,000 mg Intravenous Q8H   Continuous Infusions: . sodium chloride 1,000 mL (07/01/13 2343)  Assessment/Plan: 1. Severe sepsis 2/2 to CAP with 1/2 bottles + G+ cocci-continue Empiric coverage + vancomycin added 4/17.  CBC + diff am.  Narrow as appropriate  in a couple days.  Cut back IVF rate top 50 cc /hr 2. Sinus tachycardia-2/2 to severe sepsis.  Monitor on tele.  CP probably from pleurisy and no further w/u planned currently 3. H/o Osteo r foot-Supposed to have surgery this coming Tuesday with Dr. Sharol Given.  This has been cancelled by patient 4. Chronic pain- explained to patient will need to limit her IV opiates as might affect mentation.  Continue PRN oxycodone 5 mg only for now, Tramadol added for mod pain 5. Bipolar/shcizoaffective-ContinueLithium 300 qhs, Seroquel 800 qhs, Trazadone 400 qhs, Hydroxyzine will be held 6. H/o migraine-restarted Imitrex 25 q2 prn   Code Status: Full Family Communication:  None at bedside-she doesn;t wish me to speak with them Disposition Plan: Inpatient-transfer from SDU to tele   Verneita Griffes, MD  [facility] Pager [phone] 07/02/2013, 7:49 AM    LOS: 2 days

## 2013-07-03 ENCOUNTER — Inpatient Hospital Stay (HOSPITAL_COMMUNITY): Payer: Medicare HMO

## 2013-07-03 LAB — CBC
HCT: 29.9 % — ABNORMAL LOW (ref 36.0–46.0)
Hemoglobin: 10.3 g/dL — ABNORMAL LOW (ref 12.0–15.0)
MCH: 28.1 pg (ref 26.0–34.0)
MCHC: 34.4 g/dL (ref 30.0–36.0)
MCV: 81.7 fL (ref 78.0–100.0)
PLATELETS: 93 10*3/uL — AB (ref 150–400)
RBC: 3.66 MIL/uL — ABNORMAL LOW (ref 3.87–5.11)
RDW: 12.9 % (ref 11.5–15.5)
WBC: 3.9 10*3/uL — AB (ref 4.0–10.5)

## 2013-07-03 LAB — COMPREHENSIVE METABOLIC PANEL
ALT: 12 U/L (ref 0–35)
AST: 12 U/L (ref 0–37)
Albumin: 2.8 g/dL — ABNORMAL LOW (ref 3.5–5.2)
Alkaline Phosphatase: 62 U/L (ref 39–117)
BUN: 8 mg/dL (ref 6–23)
CALCIUM: 8.2 mg/dL — AB (ref 8.4–10.5)
CHLORIDE: 107 meq/L (ref 96–112)
CO2: 26 mEq/L (ref 19–32)
CREATININE: 0.69 mg/dL (ref 0.50–1.10)
GFR calc Af Amer: >90 mL/min (ref >90)
GFR calc non Af Amer: >90 mL/min (ref >90)
Glucose, Bld: 93 mg/dL (ref 70–99)
Potassium: 3.1 mEq/L — ABNORMAL LOW (ref 3.7–5.3)
Sodium: 142 mEq/L (ref 137–147)
Total Bilirubin: 0.3 mg/dL (ref 0.3–1.2)
Total Protein: 5.8 g/dL — ABNORMAL LOW (ref 6.0–8.3)

## 2013-07-03 LAB — CULTURE, BLOOD (ROUTINE X 2)

## 2013-07-03 MED ORDER — PREGABALIN 50 MG PO CAPS
100.0000 mg | ORAL_CAPSULE | Freq: Two times a day (BID) | ORAL | Status: DC
  Administered 2013-07-03 – 2013-07-04 (×2): 100 mg via ORAL
  Filled 2013-07-03 (×2): qty 2

## 2013-07-03 MED ORDER — POTASSIUM CHLORIDE CRYS ER 20 MEQ PO TBCR
40.0000 meq | EXTENDED_RELEASE_TABLET | Freq: Every day | ORAL | Status: DC
Start: 2013-07-03 — End: 2013-07-04
  Administered 2013-07-03 – 2013-07-04 (×2): 40 meq via ORAL
  Filled 2013-07-03 (×2): qty 2

## 2013-07-03 MED ORDER — PROMETHAZINE HCL 25 MG PO TABS
25.0000 mg | ORAL_TABLET | Freq: Four times a day (QID) | ORAL | Status: DC | PRN
  Administered 2013-07-03 – 2013-07-04 (×3): 25 mg via ORAL
  Filled 2013-07-03 (×3): qty 1

## 2013-07-03 MED ORDER — LEVOFLOXACIN 500 MG PO TABS
500.0000 mg | ORAL_TABLET | Freq: Every day | ORAL | Status: DC
  Administered 2013-07-03 – 2013-07-04 (×2): 500 mg via ORAL
  Filled 2013-07-03 (×2): qty 1

## 2013-07-03 NOTE — Progress Notes (Signed)
Note: This document was prepared with digital dictation and possible smart phrase technology. Any transcriptional errors that result from this process are unintentional.   [name] HBZ:[id number] DOB: 12-13-1965 DOA: 06/30/2013 PCP: Leeanne Rio, PA-C  Brief narrative: 48 y/o known h/o chronic pain [osteomyelitis r foot grt toe-foll by Dr. Duda], R ankle surgery with extensive debridement 2012 on Opiates, Bipolar/schizoaffective disorder on multiple meds, migraines, chronic constipation foll by GI, prior h/o Kidney stones 2012, prior episodes Toxic metabolic encephalopathy thought to be 2/2 to pain meds, asthma admitted overnight 06/30/13 with cough, toxic encephalopathy, fever and found ultimately to have CXr showing PNA and started on Rocephin/Azithromycin Gram + cocci growing in 1/2 bottles reported 4/17  Past medical history-As per Problem list Chart reviewed as below-   Consultants:  none  Procedures:  CXR  Antibiotics:  Rocephin 4/16-4/19  Azithromycin 4/16-4/19  Vancomycin 4/16-4/19   Subjective  Headaches much improved c imitrex Tol PO well No cough, fever chills n v  Passing stool Pain mainly in amputated toe R side.   Objective    Interim History:   Telemetry:    Objective: Filed Vitals:   07/02/13 1200 07/02/13 1433 07/02/13 2041 07/03/13 0614  BP: 110/74 132/81 131/76 120/71  Pulse: 83 86 80 89  Temp: 99.1 F (37.3 C) 98.6 F (37 C) 98.3 F (36.8 C) 98.6 F (37 C)  TempSrc:   Oral Oral  Resp: 12 14 14 16   Height:      Weight:      SpO2: 100% 98% 100% 100%    Intake/Output Summary (Last 24 hours) at 07/03/13 1019 Last data filed at 07/03/13 0914  Gross per 24 hour  Intake   2455 ml  Output   2550 ml  Net    -95 ml    Exam:  General: EOMI, sleepy Cardiovascular: s1 s2 tachycardic Respiratory:  Clear, no added sound, decreased AE bilaterally Abdomen: soft NT, ND 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  Data  Reviewed: Basic Metabolic Panel:  Recent Labs Lab 06/30/13 2055 07/02/13 0515 07/03/13 0525  NA 139 144 142  K 4.3 3.5* 3.1*  CL 100 110 107  CO2 26 25 26   GLUCOSE 158* 94 93  BUN 14 8 8   CREATININE 0.88 0.66 0.69  CALCIUM 9.7 7.9* 8.2*   Liver Function Tests:  Recent Labs Lab 06/30/13 2055 07/02/13 0515 07/03/13 0525  AST 26 14 12   ALT 18 15 12   ALKPHOS 71 51 62  BILITOT 0.5 0.3 0.3  PROT 7.8 5.4* 5.8*  ALBUMIN 4.0 2.5* 2.8*   No results found for this basename: LIPASE, AMYLASE,  in the last 168 hours No results found for this basename: AMMONIA,  in the last 168 hours CBC:  Recent Labs Lab 06/30/13 2055 07/02/13 0515 07/03/13 0525  WBC 9.1 5.2 3.9*  NEUTROABS 8.1* 3.8  --   HGB 14.1 10.0* 10.3*  HCT 41.7 29.5* 29.9*  MCV 83.7 83.1 81.7  PLT 122* 74* 93*   Cardiac Enzymes: No results found for this basename: CKTOTAL, CKMB, CKMBINDEX, TROPONINI,  in the last 168 hours BNP: No components found with this basename: POCBNP,  CBG: No results found for this basename: GLUCAP,  in the last 168 hours  Recent Results (from the past 240 hour(s))  CULTURE, BLOOD (ROUTINE X 2)     Status: None   Collection Time    06/30/13  8:56 PM      Result Value Ref  Range Status   Specimen Description BLOOD CENTRAL LINE   Final   Special Requests BOTTLES DRAWN AEROBIC ONLY 4CC   Final   Culture  Setup Time     Final   Value: 07/01/2013 01:20     Performed at [facility]   Culture     Final   Value: STAPHYLOCOCCUS SPECIES (COAGULASE NEGATIVE)     Note: RIFAMPIN AND GENTAMICIN SHOULD NOT BE USED AS SINGLE DRUGS FOR TREATMENT OF STAPH INFECTIONS.     17 Note: Gram Stain Report Called to,Read Back By and Verified With: MICHELLE REVES RN 4 58 845P EDMOJ     Performed at [facility]   Report Status 07/03/2013 FINAL   Final   Organism ID, Bacteria STAPHYLOCOCCUS SPECIES (COAGULASE NEGATIVE)   Final  CULTURE, BLOOD (ROUTINE X 2)     Status: None   Collection  Time    06/30/13  9:45 PM      Result Value Ref Range Status   Specimen Description BLOOD BLOOD RIGHT WRIST   Final   Special Requests BOTTLES DRAWN AEROBIC AND ANAEROBIC 5CC   Final   Culture  Setup Time     Final   Value: 07/01/2013 01:20     Performed at [facility]   Culture     Final   Value: STAPHYLOCOCCUS SPECIES (COAGULASE NEGATIVE)     Note: SUSCEPTIBILITIES PERFORMED ON PREVIOUS CULTURE WITHIN THE LAST 5 DAYS.     0335 Note: Gram Stain Report Called to,Read Back By and Verified With: MICHELLE REVES 07/02/13 [facility]     Performed at [facility]   Report Status 07/03/2013 FINAL   Final  URINE CULTURE     Status: None   Collection Time    06/30/13 10:38 PM      Result Value Ref Range Status   Specimen Description URINE, CATHETERIZED   Final   Special Requests NONE   Final   Culture  Setup Time     Final   Value: 07/01/2013 01:25     Performed at [place]     Final   Value: NO GROWTH     Performed at [facility]   Culture     Final   Value: NO GROWTH     Performed at [facility]   Report Status 07/02/2013 FINAL   Final  MRSA PCR SCREENING     Status: None   Collection Time    07/01/13  1:58 AM      Result Value Ref Range Status   MRSA by PCR NEGATIVE  NEGATIVE Final   Comment:            The GeneXpert MRSA Assay (FDA     approved for NASAL specimens     only), is one component of a     comprehensive MRSA colonization     surveillance program. It is not     intended to diagnose MRSA     infection nor to guide or     monitor treatment for     MRSA infections.     Studies:              All Imaging reviewed and is as per above notation   Scheduled Meds: . antiseptic oral rinse  15 mL Mouth Rinse q12n4p  . chlorhexidine  15 mL Mouth Rinse BID  . lithium carbonate  300 mg Oral QPM  . pregabalin  75  mg Oral BID  . QUEtiapine  800 mg Oral QHS  . traZODone  400 mg Oral QHS   Continuous Infusions: .  sodium chloride 1,000 mL (07/03/13 0904)     Assessment/Plan: 1. Severe sepsis 2/2 to CAP with 1/2 bottles + coag neg staph-Rpt CXR.  Antibiotics to be narrowed depending on CXR 2 view result. 2. AKI- Cut back IVF rate top 50 cc /hr-IV saline locked 4/19 3. Sinus tachycardia-2/2 to severe sepsis.  Monitor on tele.  CP probably from pleurisy and no further w/u planned currently 4. H/o Osteo r foot-Supposed to have surgery this coming Tuesday with Dr. Sharol Given.  This has been cancelled by patient 5. Chronic pain- explained to patient will need to limit her IV opiates as might affect mentation.  Continue PRN oxycodone 5 mg only for now, Tramadol added for mod pain-increased lyrica to 100 bid 4/19 6. Bipolar/schizoaffective-Continue Lithium 300 qhs, Seroquel 800 qhs, Trazadone 400 qhs, Hydroxyzine will be held 7. H/o migraine-restarted Imitrex 25 q2 prn 8. Mild TCP-unclear etiology-held heparinoids and sen tHIT.  SCD's at night.  If persists get periph smear   Code Status: Full Family Communication:  None at bedside-she wishes to update them herself Disposition Plan: Inpatient-transfer from SDU to tele   Verneita Griffes, MD  [facility] Pager [phone] 07/03/2013, 10:19 AM    LOS: 3 days

## 2013-07-04 ENCOUNTER — Telehealth: Payer: Self-pay | Admitting: *Deleted

## 2013-07-04 LAB — CBC
HEMATOCRIT: 31 % — AB (ref 36.0–46.0)
Hemoglobin: 10.9 g/dL — ABNORMAL LOW (ref 12.0–15.0)
MCH: 28.2 pg (ref 26.0–34.0)
MCHC: 35.2 g/dL (ref 30.0–36.0)
MCV: 80.1 fL (ref 78.0–100.0)
Platelets: 126 10*3/uL — ABNORMAL LOW (ref 150–400)
RBC: 3.87 MIL/uL (ref 3.87–5.11)
RDW: 12.8 % (ref 11.5–15.5)
WBC: 4.3 10*3/uL (ref 4.0–10.5)

## 2013-07-04 LAB — COMPREHENSIVE METABOLIC PANEL
ALT: 13 U/L (ref 0–35)
AST: 12 U/L (ref 0–37)
Albumin: 3 g/dL — ABNORMAL LOW (ref 3.5–5.2)
Alkaline Phosphatase: 60 U/L (ref 39–117)
BUN: 12 mg/dL (ref 6–23)
CHLORIDE: 110 meq/L (ref 96–112)
CO2: 24 mEq/L (ref 19–32)
Calcium: 8.5 mg/dL (ref 8.4–10.5)
Creatinine, Ser: 0.88 mg/dL (ref 0.50–1.10)
GFR calc Af Amer: 89 mL/min — ABNORMAL LOW (ref >90)
GFR calc non Af Amer: 77 mL/min — ABNORMAL LOW (ref >90)
Glucose, Bld: 95 mg/dL (ref 70–99)
Potassium: 3.3 mEq/L — ABNORMAL LOW (ref 3.7–5.3)
SODIUM: 145 meq/L (ref 137–147)
TOTAL PROTEIN: 6.2 g/dL (ref 6.0–8.3)
Total Bilirubin: 0.3 mg/dL (ref 0.3–1.2)

## 2013-07-04 MED ORDER — POTASSIUM CHLORIDE CRYS ER 20 MEQ PO TBCR: 40.0000 meq | EXTENDED_RELEASE_TABLET | Freq: Every day | ORAL | Status: DC

## 2013-07-04 MED ORDER — PROMETHAZINE HCL 25 MG PO TABS: 25.0000 mg | ORAL_TABLET | Freq: Four times a day (QID) | ORAL | Status: DC | PRN

## 2013-07-04 MED ORDER — LEVOFLOXACIN 500 MG PO TABS: 500.0000 mg | ORAL_TABLET | Freq: Every day | ORAL | Status: DC

## 2013-07-04 MED ORDER — TRAMADOL HCL 50 MG PO TABS: 50.0000 mg | ORAL_TABLET | Freq: Four times a day (QID) | ORAL | Status: DC | PRN

## 2013-07-04 MED ORDER — PREGABALIN 100 MG PO CAPS: 100.0000 mg | ORAL_CAPSULE | Freq: Two times a day (BID) | ORAL | Status: DC

## 2013-07-04 NOTE — Discharge Summary (Signed)
Physician Discharge Summary  [name] [id number] DOB: 07-04-1965 DOA: 06/30/2013  PCP: Leeanne Rio, PA-C  Admit date: 06/30/2013 Discharge date: 07/04/2013  Time spent: 32 minutes  Recommendations for Outpatient Follow-up:  1. Get CBC and Cmet in about 1 week-concern for HIT so follow HIT panel ordered in hospital-see below 2. May not need Potassium long term.  Could d/c the dose started in hospital 3. Finish Levaquin 4/23 for HCAP 4. Follow with Dr. Sharol Given once all resolved from PNA standpoint  5. Consider close Psychiatry input as OP  Discharge Diagnoses:  Principal Problem:   Severe sepsis Active Problems:   CAP (community acquired pneumonia)   Hypoxia   Sepsis   Rash   Discharge Condition: Fair  Diet recommendation:  Heart healthy  Filed Weights   06/30/13 2048 07/01/13 0100  Weight: 90.719 kg (200 lb) 91.8 kg (202 lb 6.1 oz)    History of present illness:  48 y/o known h/o chronic pain [osteomyelitis r foot grt toe-foll by Dr. Duda], R ankle surgery with extensive debridement 2012 on Opiates, Bipolar/schizoaffective disorder on multiple meds, migraines, chronic constipation foll by GI, prior h/o Kidney stones 2012, prior episodes Toxic metabolic encephalopathy thought to be 2/2 to pain meds, asthma admitted overnight 06/30/13 with cough, toxic encephalopathy, fever and found ultimately to have CXr showing PNA and started on Rocephin/Azithromycin  Gram + cocci growing in 1/2 bottles reported 4/17   Hospital Course:   1. Severe sepsis 2/2 to CAP with 1/2 bottles + coag neg staph-Rpt CXR. Antibiotics to be narrowed depending on CXR 2 view result. 2. AKI- Cut back IVF rate top 50 cc /hr-IV saline locked 4/19 and tolerating good PO 3. Sinus tachycardia-2/2 to severe sepsis. Monitor on tele. CP probably from pleurisy and no further w/u planned currently 4. H/o Osteo r foot-Supposed to have surgery this coming Tuesday with Dr. Sharol Given. This has been cancelled by  patient but patient will need follow-up for this 5. Chronic pain- explained to patient will need to limit her IV opiates as might affect mentation. Continue PRN oxycodone 5 mg only for now, Tramadol added for mod pain-increased lyrica to 100 bid 4/19 6. Bipolar/schizoaffective-Continue Lithium 300 qhs, Seroquel 800 qhs, Trazadone 400 qhs, Hydroxyzine initally held but re-started on d/c as poor sleep.  Patient was encouraged to follow with primary psychiatrist as OP and monitor 7. H/o migraine-restarted Imitrex 25 q2 prn 8. Mild TCP-unclear etiology-held heparinoids and sent HIT. SCD's at night. PLt 74-->93-->126 on d/c so follow as OP.  Would not declare heparin as an allergy but need to be followed   Consultants:  none Procedures:  CXR Antibiotics:  Rocephin 4/16-4/19  Azithromycin 4/16-4/19  Vancomycin 4/16-4/19 Levaquin 4/19-4/23  Discharge Exam: Filed Vitals:   07/04/13 0534  BP: 124/73  Pulse: 106  Temp: 99 F (37.2 C)  Resp: 14    General: alert pleasant oriented.  Poor sleep last night.  No cp/n/v/ Cardiovascular: s1 s2 no m/r/g Respiratory: clear no added sound  Discharge Instructions You were cared for by a hospitalist during your hospital stay. If you have any questions about your discharge medications or the care you received while you were in the hospital after you are discharged, you can call the unit and asked to speak with the hospitalist on call if the hospitalist that took care of you is not available. Once you are discharged, your primary care physician will handle any further medical issues. Please note that NO REFILLS for any discharge  medications will be authorized once you are discharged, as it is imperative that you return to your primary care physician (or establish a relationship with a primary care physician if you do not have one) for your aftercare needs so that they can reassess your need for medications and monitor your lab values.  Discharge Orders    Future Orders Complete By Expires   Diet - low sodium heart healthy  As directed    Discharge instructions  As directed    Increase activity slowly  As directed        Medication List         AMBIEN 10 MG tablet  Generic drug:  zolpidem  Take 10 mg by mouth at bedtime as needed for sleep. For sleep     hydrOXYzine 25 MG capsule  Commonly known as:  VISTARIL  Take 25 mg by mouth at bedtime.     levofloxacin 500 MG tablet  Commonly known as:  LEVAQUIN  Take 1 tablet (500 mg total) by mouth daily.     lithium carbonate 300 MG CR tablet  Commonly known as:  LITHOBID  Take 300 mg by mouth every evening.     oxyCODONE 5 MG immediate release tablet  Commonly known as:  Oxy IR/ROXICODONE  Take 1 tablet by mouth every 6 (six) hours as needed for moderate pain.     potassium chloride SA 20 MEQ tablet  Commonly known as:  K-DUR,KLOR-CON  Take 2 tablets (40 mEq total) by mouth daily.     pregabalin 100 MG capsule  Commonly known as:  LYRICA  Take 1 capsule (100 mg total) by mouth 2 (two) times daily.     promethazine 25 MG tablet  Commonly known as:  PHENERGAN  Take 1 tablet (25 mg total) by mouth every 6 (six) hours as needed for nausea or vomiting.     QUEtiapine 400 MG tablet  Commonly known as:  SEROQUEL  Take 800 mg by mouth at bedtime.     SUMAtriptan 25 MG tablet  Commonly known as:  IMITREX  TAKE 1 TABLET BY MOUTH ONCE. MAY REPEAT IN 2 HOURS IF HEADACHE PERSISTS OR RECURS.     traMADol 50 MG tablet  Commonly known as:  ULTRAM  Take 1 tablet (50 mg total) by mouth every 6 (six) hours as needed for moderate pain.     traZODone 100 MG tablet  Commonly known as:  DESYREL  Take 400 mg by mouth at bedtime.       No Known Allergies    The results of significant diagnostics from this hospitalization (including imaging, microbiology, ancillary and laboratory) are listed below for reference.    Significant Diagnostic Studies: Dg Chest 2 View  07/03/2013    CLINICAL DATA:  47 year old female with community-acquired pneumonia, hypoxia, sepsis. Initial encounter.  EXAM: CHEST  2 VIEW  COMPARISON:  06/30/2013 and earlier.  FINDINGS: Continued streaky and confluent left base opacity corresponding to the lower lobe on the lateral view. This most resembles bronchopneumonia. No pleural effusion. Lung markings elsewhere within normal limits. Left PICC line now in place. Normal cardiac size and mediastinal contours. Visualized tracheal air column is within normal limits. No acute osseous abnormality identified.  IMPRESSION: Left lower lobe pneumonia. Post treatment radiographs recommended to document resolution.   Electronically Signed   By: Lars Pinks M.D.   On: 07/03/2013 14:36   Ir Fluoro Guide Cv Line Left  07/01/2013   CLINICAL DATA:  Pneumonia, poor IV  access  EXAM: PICC PLACEMENT WITH ULTRASOUND AND FLUOROSCOPY  FLUOROSCOPY TIME:  Twelve seconds  TECHNIQUE: After written informed consent was obtained, patient was placed in the supine position on angiographic table. Patency of the left brachial vein was confirmed with ultrasound with image documentation. An appropriate skin site was determined. Skin site was marked. Region was prepped using maximum barrier technique including cap and mask, sterile gown, sterile gloves, large sterile sheet, and Chlorhexidine as cutaneous antisepsis. The region was infiltrated locally with 1% lidocaine. Under real-time ultrasound guidance, the left brachial vein was accessed with a 21 gauge micropuncture needle; the needle tip within the vein was confirmed with ultrasound image documentation. Needle exchanged over a 018 guidewire for a peel-away sheath, through which a 5-French double-lumen power injectable PICC trimmed to 45cm was advanced, positioned with its tip near the cavoatrial junction. Spot chest radiograph confirms appropriate catheter position. Catheter was flushed per protocol and secured externally with 0-Prolene sutures. The  patient tolerated procedure well, with no immediate complication.  IMPRESSION: Technically successful five [place] double lumen  PICC placement   Electronically Signed   By: Arne Cleveland M.D.   On: 07/01/2013 12:40   Ir US Guide Vasc Access Left  07/01/2013   CLINICAL DATA:  Pneumonia, poor IV access  EXAM: PICC PLACEMENT WITH ULTRASOUND AND FLUOROSCOPY  FLUOROSCOPY TIME:  Twelve seconds  TECHNIQUE: After written informed consent was obtained, patient was placed in the supine position on angiographic table. Patency of the left brachial vein was confirmed with ultrasound with image documentation. An appropriate skin site was determined. Skin site was marked. Region was prepped using maximum barrier technique including cap and mask, sterile gown, sterile gloves, large sterile sheet, and Chlorhexidine as cutaneous antisepsis. The region was infiltrated locally with 1% lidocaine. Under real-time ultrasound guidance, the left brachial vein was accessed with a 21 gauge micropuncture needle; the needle tip within the vein was confirmed with ultrasound image documentation. Needle exchanged over a 018 guidewire for a peel-away sheath, through which a 5-French double-lumen power injectable PICC trimmed to 45cm was advanced, positioned with its tip near the cavoatrial junction. Spot chest radiograph confirms appropriate catheter position. Catheter was flushed per protocol and secured externally with 0-Prolene sutures. The patient tolerated procedure well, with no immediate complication.  IMPRESSION: Technically successful five [place] double lumen  PICC placement   Electronically Signed   By: Arne Cleveland M.D.   On: 07/01/2013 12:40   Dg Chest Port 1 View  06/30/2013   CLINICAL DATA:  Sepsis  EXAM: PORTABLE CHEST - 1 VIEW  COMPARISON:  DG ABD ACUTE W/CHEST dated 03/09/2013  FINDINGS: There is left lower lobe retrocardiac airspace disease. There is no other focal consolidation, pleural effusion or pneumothorax. The  heart and mediastinal contours are unremarkable.  The osseous structures are unremarkable.  IMPRESSION: Left lower lobe retrocardiac airspace disease which may reflect atelectasis versus pneumonia. Recommend dedicated PA and lateral views of the chest.   Electronically Signed   By: Kathreen Devoid   On: 06/30/2013 22:31   Dg Foot Complete Right  07/01/2013   CLINICAL DATA:  Rash along the lateral side of the great toe  EXAM: RIGHT FOOT COMPLETE - 3+ VIEW  COMPARISON:  None.  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 There is mild soft tissue swelling of the right forefoot. There is arthrodesis of the first MTP joint. There is no acute fracture or dislocation. There is no bone destruction. There is no foreign body.  IMPRESSION: 1. No acute osseous injury of the right foot. 2. No radiographic evidence of osteomyelitis of the right foot. 3. No subcutaneous emphysema or radiopaque foreign body. Mild soft tissue swelling of the right forefoot.   Electronically Signed   By: Kathreen Devoid   On: 07/01/2013 00:23    Microbiology: Recent Results (from the past 240 hour(s))  CULTURE, BLOOD (ROUTINE X 2)     Status: None   Collection Time    06/30/13  8:56 PM      Result Value Ref Range Status   Specimen Description BLOOD CENTRAL LINE   Final   Special Requests BOTTLES DRAWN AEROBIC ONLY 4CC   Final   Culture  Setup Time     Final   Value: 07/01/2013 01:20     Performed at [facility]   Culture     Final   Value: STAPHYLOCOCCUS SPECIES (COAGULASE NEGATIVE)     Note: RIFAMPIN AND GENTAMICIN SHOULD NOT BE USED AS SINGLE DRUGS FOR TREATMENT OF STAPH INFECTIONS.     17 Note: Gram Stain Report Called to,Read Back By and Verified With: MICHELLE REVES RN [phone] [id number] EDMOJ     Performed at [facility]   Report Status 07/03/2013 FINAL   Final   Organism ID, Bacteria STAPHYLOCOCCUS SPECIES (COAGULASE NEGATIVE)   Final  CULTURE, BLOOD (ROUTINE X 2)     Status: None   Collection Time     06/30/13  9:45 PM      Result Value Ref Range Status   Specimen Description BLOOD BLOOD RIGHT WRIST   Final   Special Requests BOTTLES DRAWN AEROBIC AND ANAEROBIC 5CC   Final   Culture  Setup Time     Final   Value: 07/01/2013 01:20     Performed at [facility]   Culture     Final   Value: STAPHYLOCOCCUS SPECIES (COAGULASE NEGATIVE)     Note: SUSCEPTIBILITIES PERFORMED ON PREVIOUS CULTURE WITHIN THE LAST 5 DAYS.     0335 Note: Gram Stain Report Called to,Read Back By and Verified With: MICHELLE REVES 07/02/13 [facility]     Performed at [facility]   Report Status 07/03/2013 FINAL   Final  URINE CULTURE     Status: None   Collection Time    06/30/13 10:38 PM      Result Value Ref Range Status   Specimen Description URINE, CATHETERIZED   Final   Special Requests NONE   Final   Culture  Setup Time     Final   Value: 07/01/2013 01:25     Performed at [place]     Final   Value: NO GROWTH     Performed at [facility]   Culture     Final   Value: NO GROWTH     Performed at [facility]   Report Status 07/02/2013 FINAL   Final  MRSA PCR SCREENING     Status: None   Collection Time    07/01/13  1:58 AM      Result Value Ref Range Status   MRSA by PCR NEGATIVE  NEGATIVE Final   Comment:            The GeneXpert MRSA Assay (FDA     approved for NASAL specimens     only), is one component of a     comprehensive MRSA colonization     surveillance program. It is not  intended to diagnose MRSA     infection nor to guide or     monitor treatment for     MRSA infections.     Labs: Basic Metabolic Panel:  Recent Labs Lab 06/30/13 2055 07/02/13 0515 07/03/13 0525 07/04/13 0425  NA 139 144 142 145  K 4.3 3.5* 3.1* 3.3*  CL 100 110 107 110  CO2 26 25 26 24   GLUCOSE 158* 94 93 95  BUN 14 8 8 12   CREATININE 0.88 0.66 0.69 0.88  CALCIUM 9.7 7.9* 8.2* 8.5   Liver Function Tests:  Recent Labs Lab 06/30/13 2055  07/02/13 0515 07/03/13 0525 07/04/13 0425  AST 26 14 12 12   ALT 18 15 12 13   ALKPHOS 71 51 62 60  BILITOT 0.5 0.3 0.3 0.3  PROT 7.8 5.4* 5.8* 6.2  ALBUMIN 4.0 2.5* 2.8* 3.0*   No results found for this basename: LIPASE, AMYLASE,  in the last 168 hours No results found for this basename: AMMONIA,  in the last 168 hours CBC:  Recent Labs Lab 06/30/13 2055 07/02/13 0515 07/03/13 0525 07/04/13 0425  WBC 9.1 5.2 3.9* 4.3  NEUTROABS 8.1* 3.8  --   --   HGB 14.1 10.0* 10.3* 10.9*  HCT 41.7 29.5* 29.9* 31.0*  MCV 83.7 83.1 81.7 80.1  PLT 122* 74* 93* 126*   Cardiac Enzymes: No results found for this basename: CKTOTAL, CKMB, CKMBINDEX, TROPONINI,  in the last 168 hours BNP: BNP (last 3 results) No results found for this basename: PROBNP,  in the last 8760 hours CBG: No results found for this basename: GLUCAP,  in the last 168 hours     Signed:  [name]  [facility] 07/04/2013, 9:24 AM

## 2013-07-04 NOTE — Care Management Note (Signed)
    Page 1 of 2   07/04/2013     10:26:48 AM CARE MANAGEMENT NOTE 07/04/2013  Patient:  SONJA, MANSEAU   Account Number:  [id number]  Date Initiated:  07/01/2013  Documentation initiated by:  DAVIS,RHONDA  Subjective/Objective Assessment:   pt with temp, cough and found to be increasingly lethgaric by husband, fluy versus sepsis     Action/Plan:   home when stable   Anticipated DC Date:  07/04/2013   Anticipated DC Plan:  HOME/SELF CARE  In-house referral  NA      DC Planning Services  CM consult      PAC Choice  NA   Choice offered to / List presented to:  NA      DME agency  NA     [place] arranged  NA      [place] agency  NA   Status of service:  Completed, signed off Medicare Important Message given?  NO (If response is "NO", the following Medicare IM given date fields will be blank) Date Medicare IM given:   Date Additional Medicare IM given:    Discharge Disposition:  HOME/SELF CARE  Per UR Regulation:  Reviewed for med. necessity/level of care/duration of stay  If discussed at [place] of Stay Meetings, dates discussed:    Comments:  07/04/13 [facility] RN,BSN NCM [phone] D/C HOME NO NEEDS OR ORDERS.  [id number]/Rhonda Rosana Hoes, RN, [place], [place] [phone] Chart Reviewed for discharge and hospital needs. Discharge needs at time of review: None present will follow for needs. Review of patient progress due on [phone].

## 2013-07-04 NOTE — Telephone Encounter (Addendum)
Message Received: Today     Leeanne Rio, PA-C                Rockwell Germany, CMA    Please call patient to get follow-up scheduled for 1 week from discharge date (07/04/13). Thank you.       LMOM with contact name and number for return call RE: scheduling follow-up appt post Hospital per provider instructions/SLS

## 2013-07-06 LAB — HEPARIN INDUCED THROMBOCYTOPENIA PNL
Heparin Induced Plt Ab: NEGATIVE
Patient O.D.: 0.124
UFH High Dose UFH H: 0 % Release
UFH LOW DOSE 0.1 IU/ML: 0 %
UFH Low Dose 0.5 IU/mL: 0 % Release
UFH SRA RESULT: NEGATIVE

## 2013-07-06 NOTE — Telephone Encounter (Signed)
Patient has OV/appt scheduled for Mon. 04.27.15 at 2:30p/SLS

## 2013-07-11 ENCOUNTER — Ambulatory Visit: Payer: Medicare HMO | Admitting: Physician Assistant

## 2013-07-18 ENCOUNTER — Telehealth: Payer: Self-pay

## 2013-07-18 ENCOUNTER — Ambulatory Visit: Payer: Medicare HMO | Admitting: Physician Assistant

## 2013-07-18 NOTE — Telephone Encounter (Signed)
Message copied by Varney Daily on Mon Jul 18, 2013  2:15 PM ------      Message from: Raiford Noble      Created: Mon Jul 18, 2013  2:06 PM      Regarding: Hosptial Follow-up       Patient was scheduled for hospital follow-up today for sepsis.  Patient apparently canceled her appointment 15-30 minutes prior to her appointment due to "feeling sick".  I need for her to be called and triaged -- she also needs to be rescheduled for follow-up.  ------

## 2013-07-18 NOTE — Telephone Encounter (Signed)
FYI: I called patient and left a vm for her to return my call.

## 2013-07-20 NOTE — Telephone Encounter (Signed)
LMOM with contact name and number for return call RE: importance of [facility] F/U appointment and further provider instructions/SLS

## 2013-07-21 ENCOUNTER — Encounter: Payer: Self-pay | Admitting: *Deleted

## 2013-07-28 ENCOUNTER — Ambulatory Visit (INDEPENDENT_AMBULATORY_CARE_PROVIDER_SITE_OTHER): Payer: Medicare (Managed Care) | Admitting: Physician Assistant

## 2013-07-28 ENCOUNTER — Encounter: Payer: Self-pay | Admitting: Physician Assistant

## 2013-07-28 ENCOUNTER — Ambulatory Visit (HOSPITAL_BASED_OUTPATIENT_CLINIC_OR_DEPARTMENT_OTHER)
Admission: RE | Admit: 2013-07-28 | Discharge: 2013-07-28 | Disposition: A | Discharge status: Home / Self Care | Payer: Medicare PPO | Source: Ambulatory Visit | Attending: Physician Assistant | Admitting: Physician Assistant

## 2013-07-28 VITALS — BP 98/66 | HR 126 | Temp 98.2°F | Resp 16 | Ht 66.0 in | Wt 201.2 lb

## 2013-07-28 DIAGNOSIS — Z09 Encounter for follow-up examination after completed treatment for conditions other than malignant neoplasm: Secondary | ICD-10-CM | POA: Insufficient documentation

## 2013-07-28 DIAGNOSIS — R112 Nausea with vomiting, unspecified: Secondary | ICD-10-CM | POA: Insufficient documentation

## 2013-07-28 DIAGNOSIS — F250 Schizoaffective disorder, bipolar type: Secondary | ICD-10-CM

## 2013-07-28 DIAGNOSIS — A419 Sepsis, unspecified organism: Secondary | ICD-10-CM

## 2013-07-28 DIAGNOSIS — D696 Thrombocytopenia, unspecified: Secondary | ICD-10-CM | POA: Insufficient documentation

## 2013-07-28 DIAGNOSIS — F259 Schizoaffective disorder, unspecified: Secondary | ICD-10-CM

## 2013-07-28 DIAGNOSIS — J189 Pneumonia, unspecified organism: Secondary | ICD-10-CM

## 2013-07-28 NOTE — Patient Instructions (Signed)
Please obtain labs.  I will call you with your results. Please go downstairs to first floor for x-ray. Please continue medications as directed.  Stay well-hydrated.  Take phenergan for nausea.  You will be contacted for an abdominal ultrasound.  IF norma and labs a re unremarkable, we will set you up with a Gastroenterologist.  If you develop acute worsening of symptoms before workup can be completed, please proceed to the ER.

## 2013-07-28 NOTE — Progress Notes (Signed)
Pre visit review using our clinic review tool, if applicable. No additional management support is needed unless otherwise documented below in the visit note/SLS  

## 2013-07-28 NOTE — Assessment & Plan Note (Signed)
Will repeat CBC, CMP.

## 2013-07-28 NOTE — Assessment & Plan Note (Signed)
First noted during hospital admission.  HIT panel ordered and negative.  Platelet count trended upwards during stay.  Will repeat CBC to assess continued resolution of thrombocytopenia.

## 2013-07-28 NOTE — Assessment & Plan Note (Signed)
Chronic.  Will obtain CBC, CMP, lipase.  Will obtain US abdomen.  If unremarkable, will refer to GI.

## 2013-07-28 NOTE — Progress Notes (Signed)
Patient presents to clinic today for hospital follow-up for sepsis and CAP.  Patient was seen in ER on 06/30/13.  Patient was subsequently admitted to the hospital for IV fluids and antibiotics.  Blood culture grew out coagulase-negative staph. Mild thrombocytopenia noted.  HIT panel obtained and negative.  Platelet count tre Patient was followed closely throughout hospital stay.  Discharged when stable with PO Levaquin and potassium supplement.  Patient endorses completing course of antibiotic.  Denies fever, cough, shortness of breath, pleuritic chest pain.  Has follow-up scheduled this Friday with her psychiatrist.  Ladon Applebaum taking medications as directed.  Has follow-up with Dr. Sharol Given scheduled on Monday.  Patient does endorse intermittent nausea and vomiting that has been present for almost a year.  Denies abdominal pain, cramping or distention.  Denies diarrhea or constipation.  Denies tenesmus, melena or hematochezia.  Denies acid reflux or dysphagia. Has had long-standing Rx for phenergan that she takes with nausea 2/2 migraine headaches.  Past Medical History  Diagnosis Date  . Dysrhythmia     Left sided heart diease  . Mental disorder   . Bipolar affective disorder, mixed, in partial or remission   . Headache(784.0)   . Anxiety   . Depression   . 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 History of gallstones   . PONV (postoperative nausea and vomiting)   . Sleep apnea   . Renal disorder   . Kidney stones   . Pneumonia     Hx: of as a child  . Migraine     Current Outpatient Prescriptions on File Prior to Visit  Medication Sig Dispense Refill  . hydrOXYzine (VISTARIL) 25 MG capsule Take 25 mg by mouth at bedtime.      Marland Kitchen lithium carbonate (LITHOBID) 300 MG CR tablet Take 300 mg by mouth every evening.       Marland Kitchen oxyCODONE (OXY IR/ROXICODONE) 5 MG immediate release tablet Take 1 tablet by mouth every 6 (six) hours as needed for moderate pain.       . potassium chloride  SA (K-DUR,KLOR-CON) 20 MEQ tablet Take 2 tablets (40 mEq total) by mouth daily.  20 tablet  0  . pregabalin (LYRICA) 100 MG capsule Take 1 capsule (100 mg total) by mouth 2 (two) times daily.  60 capsule  0  . promethazine (PHENERGAN) 25 MG tablet Take 1 tablet (25 mg total) by mouth every 6 (six) hours as needed for nausea or vomiting.  30 tablet  0  . QUEtiapine (SEROQUEL) 400 MG tablet Take 800 mg by mouth at bedtime.       . SUMAtriptan (IMITREX) 25 MG tablet TAKE 1 TABLET BY MOUTH ONCE. MAY REPEAT IN 2 HOURS IF HEADACHE PERSISTS OR RECURS.  10 tablet  0  . traMADol (ULTRAM) 50 MG tablet Take 1 tablet (50 mg total) by mouth every 6 (six) hours as needed for moderate pain.  30 tablet  0  . traZODone (DESYREL) 100 MG tablet Take 400 mg by mouth at bedtime.      Marland Kitchen zolpidem (AMBIEN) 10 MG tablet Take 10 mg by mouth at bedtime as needed for sleep. For sleep       No current facility-administered medications on file prior to visit.    No Known Allergies  Family History  Problem Relation Age of Onset  . Colon cancer Father 3  . Stomach cancer Neg Hx   . Rectal cancer Neg Hx   . Esophageal cancer Neg  Hx   . Breast cancer Mother   . Breast cancer Father   . Uterine cancer Mother     History   Social History  . Marital Status: Married    Spouse Name: N/A    Number of Children: 6  . Years of Education: N/A   Occupational History  . nursing student    Social History Main Topics  . Smoking status: Never Smoker   . Smokeless tobacco: Never Used  . Alcohol Use: No  . Drug Use: No     Comment: see triage note  . Sexual Activity: No   Other Topics Concern  . 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  All other ROS are negative.  BP 98/66  Pulse 126  Temp(Src) 98.2 F (36.8 C) (Oral)  Resp 16  Ht _0  (1.676 m)  Wt 201 lb 4 oz (91.286 kg)  BMI 32.50 kg/m2  SpO2 97%  Physical Exam  Vitals reviewed. Constitutional: She is oriented to person, place, and time and well-developed, well-nourished, and in no distress.  HENT:  Head: Normocephalic and atraumatic.  Right Ear: External ear normal.  Left Ear: External ear normal.  Nose: Nose normal.  Mouth/Throat: Oropharynx is clear and moist. No oropharyngeal exudate.  Eyes: Conjunctivae are normal. Pupils are equal, round, and reactive to light.  Neck: Neck supple.  Cardiovascular: Regular rhythm, normal heart sounds and intact distal pulses.   Mildly tachycardic.  Pulmonary/Chest: Effort normal and breath sounds normal. No respiratory distress. She has no wheezes. She has no rales. She exhibits no tenderness.  Abdominal: Soft. Bowel sounds are normal. She exhibits no distension and no mass. There is no tenderness. There is no rebound and no guarding.  Neurological: She is alert and oriented to person, place, and time.  Skin: Skin is warm and dry. No rash noted.  Psychiatric: Affect normal.    Recent Results (from the past 2160 hour(s))  CBC WITH DIFFERENTIAL     Status: Abnormal   Collection Time    06/30/13  8:55 PM      Result Value Ref Range   WBC 9.1  4.0 - 10.5 K/uL   Comment: WHITE COUNT CONFIRMED ON SMEAR   RBC 4.98  3.87 - 5.11 MIL/uL   Hemoglobin 14.1  12.0 - 15.0 g/dL   HCT 41.7  36.0 - 46.0 %   MCV 83.7  78.0 - 100.0 fL   MCH 28.3  26.0 - 34.0 pg   MCHC 33.8  30.0 - 36.0 g/dL   RDW 13.3  11.5 - 15.5 %   Platelets 122 (*) 150 - 400 K/uL   Neutrophils Relative % 89 (*) 43 - 77 %   Lymphocytes Relative 5 (*) 12 - 46 %   Monocytes Relative 6  3 - 12 %   Eosinophils Relative 0  0 - 5 %   Basophils Relative 0  0 - 1 %   Neutro Abs 8.1 (*) 1.7 - 7.7 K/uL   Lymphs Abs 0.5 (*) 0.7 - 4.0 K/uL   Monocytes  Absolute 0.5  0.1 - 1.0 K/uL   Eosinophils Absolute 0.0  0.0 - 0.7 K/uL   Basophils Absolute 0.0  0.0 - 0.1 K/uL   WBC Morphology INCREASED BANDS (>20% BANDS)     Smear Review LARGE PLATELETS PRESENT    COMPREHENSIVE METABOLIC PANEL     Status: Abnormal   Collection Time    06/30/13  8:55 PM      Result Value Ref Range   Sodium 139  137 - 147 mEq/L   Potassium 4.3  3.7 - 5.3 mEq/L   Comment: SLIGHT HEMOLYSIS     HEMOLYSIS AT THIS LEVEL MAY AFFECT RESULT   Chloride 100  96 - 112 mEq/L   CO2 26  19 - 32 mEq/L   Glucose, Bld 158 (*) 70 - 99 mg/dL   BUN 14  6 - 23 mg/dL   Creatinine, Ser 0.88  0.50 - 1.10 mg/dL   Calcium 9.7  8.4 - 10.5 mg/dL   Total Protein 7.8  6.0 - 8.3 g/dL   Albumin 4.0  3.5 - 5.2 g/dL   AST 26  0 - 37 U/L   Comment: SLIGHT HEMOLYSIS     HEMOLYSIS AT THIS LEVEL MAY AFFECT RESULT   ALT 18  0 - 35 U/L   Comment: SLIGHT HEMOLYSIS     HEMOLYSIS AT THIS LEVEL MAY AFFECT RESULT   Alkaline Phosphatase 71  39 - 117 U/L   Total Bilirubin 0.5  0.3 - 1.2 mg/dL   GFR calc non Af Amer 77 (*) >90 mL/min   GFR calc Af Amer 89 (*) >90 mL/min   Comment: (NOTE)     The eGFR has been calculated using the CKD EPI equation.     This calculation has not been validated in all clinical situations.     eGFR's persistently <90 mL/min signify possible Chronic Kidney     Disease.  CULTURE, BLOOD (ROUTINE X 2)     Status: None   Collection Time    06/30/13  8:56 PM      Result Value Ref Range   Specimen Description BLOOD CENTRAL LINE     Special Requests BOTTLES DRAWN AEROBIC ONLY 4CC     Culture  Setup Time       Value: 07/01/2013 01:20     Performed at [facility]   Culture       Value: STAPHYLOCOCCUS SPECIES (COAGULASE NEGATIVE)     Note: RIFAMPIN AND GENTAMICIN SHOULD NOT BE USED AS SINGLE DRUGS FOR TREATMENT OF STAPH INFECTIONS.     17 Note: Gram Stain Report Called to,Read Back By and Verified With: MICHELLE REVES RN 4 [id number] EDMOJ     Performed at [facility]   Report Status 07/03/2013 FINAL     Organism ID, Bacteria STAPHYLOCOCCUS SPECIES (COAGULASE NEGATIVE)    I-STAT CG4 LACTIC ACID, ED     Status: None   Collection Time    06/30/13  9:00 PM      Result Value Ref Range   Lactic Acid, Venous 0.79  0.5 - 2.2 mmol/L  CULTURE, BLOOD (ROUTINE X 2)     Status: None   Collection Time    06/30/13  9:45 PM      Result Value Ref Range   Specimen Description BLOOD BLOOD RIGHT WRIST     Special Requests BOTTLES DRAWN AEROBIC AND ANAEROBIC 5CC     Culture  Setup Time       Value: 07/01/2013 01:20  Performed at [facility]   Culture       Value: STAPHYLOCOCCUS SPECIES (COAGULASE NEGATIVE)     Note: SUSCEPTIBILITIES PERFORMED ON PREVIOUS CULTURE WITHIN THE LAST 5 DAYS.     0335 Note: Gram Stain Report Called to,Read Back By and Verified With: MICHELLE REVES 07/02/13 [facility]     Performed at [facility]   Report Status 07/03/2013 FINAL    URINALYSIS, ROUTINE W REFLEX MICROSCOPIC     Status: Abnormal   Collection Time    06/30/13 10:38 PM      Result Value Ref Range   Color, Urine AMBER (*) YELLOW   Comment: BIOCHEMICALS MAY BE AFFECTED BY COLOR   APPearance TURBID (*) CLEAR   Specific Gravity, Urine 1.027  1.005 - 1.030   pH 5.0  5.0 - 8.0   Glucose, UA NEGATIVE  NEGATIVE mg/dL   Hgb urine dipstick NEGATIVE  NEGATIVE   Bilirubin Urine SMALL (*) NEGATIVE   Ketones, ur NEGATIVE  NEGATIVE mg/dL   Protein, ur 30 (*) NEGATIVE mg/dL   Urobilinogen, UA 0.2  0.0 - 1.0 mg/dL   Nitrite NEGATIVE  NEGATIVE   Leukocytes, UA NEGATIVE  NEGATIVE  URINE CULTURE     Status: None   Collection Time    06/30/13 10:38 PM      Result Value Ref Range   Specimen Description URINE, CATHETERIZED     Special Requests NONE     Culture  Setup Time       Value: 07/01/2013 01:25     Performed at [facility]       Value: NO GROWTH     Performed at [facility]   Culture       Value: NO GROWTH      Performed at [facility]   Report Status 07/02/2013 FINAL    URINE MICROSCOPIC-ADD ON     Status: Abnormal   Collection Time    06/30/13 10:38 PM      Result Value Ref Range   Squamous Epithelial / LPF RARE  RARE   WBC, UA 0-2  <3 WBC/hpf   RBC / HPF 0-2  <3 RBC/hpf   Bacteria, UA MANY (*) RARE   Casts HYALINE CASTS (*) NEGATIVE   Urine-Other MUCOUS PRESENT     Comment: AMORPHOUS URATES/PHOSPHATES  LEGIONELLA ANTIGEN, URINE     Status: None   Collection Time    07/01/13 12:45 AM      Result Value Ref Range   Specimen Description URINE, CATHETERIZED     Special Requests NONE     Legionella Antigen, Urine       Value: Negative for Legionella pneumophilia serogroup 1     Performed at [facility]   Report Status 07/02/2013 FINAL    STREP PNEUMONIAE URINARY ANTIGEN     Status: None   Collection Time    07/01/13 12:45 AM      Result Value Ref Range   Strep Pneumo Urinary Antigen NEGATIVE  NEGATIVE   Comment:            Infection due to S. pneumoniae     cannot be absolutely ruled out     since the antigen present     may be below the detection limit     of the test.     (NOTE)     PERFORMED AT [facility]     Performed at [facility]  MRSA PCR SCREENING  Status: None   Collection Time    07/01/13  1:58 AM      Result Value Ref Range   MRSA by PCR NEGATIVE  NEGATIVE   Comment:            The GeneXpert MRSA Assay (FDA     approved for NASAL specimens     only), is one component of a     comprehensive MRSA colonization     surveillance program. It is not     intended to diagnose MRSA     infection nor to guide or     monitor treatment for     MRSA infections.  CBC WITH DIFFERENTIAL     Status: Abnormal   Collection Time    07/02/13  5:15 AM      Result Value Ref Range   WBC 5.2  4.0 - 10.5 K/uL   RBC 3.55 (*) 3.87 - 5.11 MIL/uL   Hemoglobin 10.0 (*) 12.0 - 15.0 g/dL   Comment: DELTA CHECK NOTED     REPEATED TO VERIFY   HCT 29.5 (*) 36.0 - 46.0 %    MCV 83.1  78.0 - 100.0 fL   MCH 28.2  26.0 - 34.0 pg   MCHC 33.9  30.0 - 36.0 g/dL   RDW 13.2  11.5 - 15.5 %   Platelets 74 (*) 150 - 400 K/uL   Comment: DELTA CHECK NOTED     REPEATED TO VERIFY     SPECIMEN CHECKED FOR CLOTS     PLATELET COUNT CONFIRMED BY SMEAR   Neutrophils Relative % 73  43 - 77 %   Neutro Abs 3.8  1.7 - 7.7 K/uL   Lymphocytes Relative 21  12 - 46 %   Lymphs Abs 1.1  0.7 - 4.0 K/uL   Monocytes Relative 6  3 - 12 %   Monocytes Absolute 0.3  0.1 - 1.0 K/uL   Eosinophils Relative 0  0 - 5 %   Eosinophils Absolute 0.0  0.0 - 0.7 K/uL   Basophils Relative 0  0 - 1 %   Basophils Absolute 0.0  0.0 - 0.1 K/uL  COMPREHENSIVE METABOLIC PANEL     Status: Abnormal   Collection Time    07/02/13  5:15 AM      Result Value Ref Range   Sodium 144  137 - 147 mEq/L   Potassium 3.5 (*) 3.7 - 5.3 mEq/L   Comment: RESULT REPEATED AND VERIFIED     DELTA CHECK NOTED   Chloride 110  96 - 112 mEq/L   Comment: RESULT REPEATED AND VERIFIED     DELTA CHECK NOTED   CO2 25  19 - 32 mEq/L   Glucose, Bld 94  70 - 99 mg/dL   BUN 8  6 - 23 mg/dL   Creatinine, Ser 0.66  0.50 - 1.10 mg/dL   Calcium 7.9 (*) 8.4 - 10.5 mg/dL   Total Protein 5.4 (*) 6.0 - 8.3 g/dL   Albumin 2.5 (*) 3.5 - 5.2 g/dL   AST 14  0 - 37 U/L   ALT 15  0 - 35 U/L   Alkaline Phosphatase 51  39 - 117 U/L   Total Bilirubin 0.3  0.3 - 1.2 mg/dL   GFR calc non Af Amer >90  >90 mL/min   GFR calc Af Amer >90  >90 mL/min   Comment: (NOTE)     The eGFR has been calculated using the CKD EPI equation.  This calculation has not been validated in all clinical situations.     eGFR's persistently <90 mL/min signify possible Chronic Kidney     Disease.  HEPARIN INDUCED THROMBOCYTOPENIA PNL     Status: None   Collection Time    07/02/13  8:45 AM      Result Value Ref Range   Heparin Induced Plt Ab Negative  Negative   Comment: (NOTE)     This test is approximately 95% sensitive for     detecting antibodies to the  Platelet Factor 4/Heparin     Complex (PF4/Heparin). Although the result of     this patient's sample is negative, a small     percentage of patients may still be positive     by the Serotonin Release Assay (SRA), possibly     due to other target antigens involved in this     reaction such as Interleukin-8 or Neutrophil-     Activating Peptide-2 (NAP-2). A clinical risk     assessment score may provide additional guidance     in deciding whether additional testing is of     value (J Thromb Haemost 2008;6:1304-12).   Patient O.D. 0.124     Comment: (NOTE)     ______________________________________     !   O.D.units        !    Result       !     !____________________!_________________!     ! <=0.300            !   Negative      !     !  >0.300 to <=0.500 !  Weak Positive  !     !  >0.500            !   Positive      !     !____________________!_________________!     For more information on this test, go to:     http://education.[facility].com/faq/FAQ06V1   UFH SRA Result Negative  Negative   Comment: (NOTE)     A sample is considered negative if there is     <20% release.     The SRA has a sensitivity (88-100%) and specificity     (89-100%) for Heparin Induced Thrombocytopenia (HIT)     Rondel Baton, Hematology Am Soc Hematol Educ     Program. 2009; 225-232).   UFH Low Dose 0.1 IU/mL 0     UFH Low Dose 0.5 IU/mL 0     UFH High Dose UFH H 0     Comment: (NOTE)     This test was developed and its performance     characteristics have been determined by [employer], [place], [place].     It has not been cleared or approved by the U.[place].     Food and Drug Administration. The FDA has determined     that such clearance or approval is not necessary.     Performance characteristics refer to the analytical     performance of the test.     For more information on this test, go to:     http://education.[facility].com/faq/FAQ06V1     Performed at  [facility]  CBC     Status: Abnormal   Collection Time    07/03/13  5:25 AM      Result Value Ref Range   WBC 3.9 (*) 4.0 - 10.5 K/uL   RBC 3.66 (*) 3.87 -  5.11 MIL/uL   Hemoglobin 10.3 (*) 12.0 - 15.0 g/dL   HCT 29.9 (*) 36.0 - 46.0 %   MCV 81.7  78.0 - 100.0 fL   MCH 28.1  26.0 - 34.0 pg   MCHC 34.4  30.0 - 36.0 g/dL   RDW 12.9  11.5 - 15.5 %   Platelets 93 (*) 150 - 400 K/uL   Comment: CONSISTENT WITH PREVIOUS RESULT  COMPREHENSIVE METABOLIC PANEL     Status: Abnormal   Collection Time    07/03/13  5:25 AM      Result Value Ref Range   Sodium 142  137 - 147 mEq/L   Potassium 3.1 (*) 3.7 - 5.3 mEq/L   Chloride 107  96 - 112 mEq/L   CO2 26  19 - 32 mEq/L   Glucose, Bld 93  70 - 99 mg/dL   BUN 8  6 - 23 mg/dL   Creatinine, Ser 0.69  0.50 - 1.10 mg/dL   Calcium 8.2 (*) 8.4 - 10.5 mg/dL   Total Protein 5.8 (*) 6.0 - 8.3 g/dL   Albumin 2.8 (*) 3.5 - 5.2 g/dL   AST 12  0 - 37 U/L   ALT 12  0 - 35 U/L   Alkaline Phosphatase 62  39 - 117 U/L   Total Bilirubin 0.3  0.3 - 1.2 mg/dL   GFR calc non Af Amer >90  >90 mL/min   GFR calc Af Amer >90  >90 mL/min   Comment: (NOTE)     The eGFR has been calculated using the CKD EPI equation.     This calculation has not been validated in all clinical situations.     eGFR's persistently <90 mL/min signify possible Chronic Kidney     Disease.  COMPREHENSIVE METABOLIC PANEL     Status: Abnormal   Collection Time    07/04/13  4:25 AM      Result Value Ref Range   Sodium 145  137 - 147 mEq/L   Potassium 3.3 (*) 3.7 - 5.3 mEq/L   Chloride 110  96 - 112 mEq/L   CO2 24  19 - 32 mEq/L   Glucose, Bld 95  70 - 99 mg/dL   BUN 12  6 - 23 mg/dL   Creatinine, Ser 0.88  0.50 - 1.10 mg/dL   Calcium 8.5  8.4 - 10.5 mg/dL   Total Protein 6.2  6.0 - 8.3 g/dL   Albumin 3.0 (*) 3.5 - 5.2 g/dL   AST 12  0 - 37 U/L   ALT 13  0 - 35 U/L   Alkaline Phosphatase 60  39 - 117 U/L   Total Bilirubin 0.3  0.3 - 1.2 mg/dL   GFR calc non Af Amer 77  (*) >90 mL/min   GFR calc Af Amer 89 (*) >90 mL/min   Comment: (NOTE)     The eGFR has been calculated using the CKD EPI equation.     This calculation has not been validated in all clinical situations.     eGFR's persistently <90 mL/min signify possible Chronic Kidney     Disease.  CBC     Status: Abnormal   Collection Time    07/04/13  4:25 AM      Result Value Ref Range   WBC 4.3  4.0 - 10.5 K/uL   RBC 3.87  3.87 - 5.11 MIL/uL   Hemoglobin 10.9 (*) 12.0 - 15.0 g/dL   HCT 31.0 (*) 36.0 - 46.0 %  MCV 80.1  78.0 - 100.0 fL   MCH 28.2  26.0 - 34.0 pg   MCHC 35.2  30.0 - 36.0 g/dL   RDW 12.8  11.5 - 15.5 %   Platelets 126 (*) 150 - 400 K/uL   Comment: DELTA CHECK NOTED     REPEATED TO VERIFY   Assessment/Plan: CAP (community acquired pneumonia) Physical exam within normal limits.  Mildly tachycardic.  Will obtain labs.  Repeat CXR to ensure resolution of pneumonia.  Schizoaffective disorder, bipolar type Patient with upcoming psychiatric follow-up.  Continue medications as directed by specialist.  Sepsis Will repeat CBC, CMP.    Nausea with vomiting Chronic.  Will obtain CBC, CMP, lipase.  Will obtain US abdomen.  If unremarkable, will refer to GI.  Thrombocytopenia, unspecified First noted during hospital admission.  HIT panel ordered and negative.  Platelet count trended upwards during stay.  Will repeat CBC to assess continued resolution of thrombocytopenia.

## 2013-07-28 NOTE — Assessment & Plan Note (Signed)
Patient with upcoming psychiatric follow-up.  Continue medications as directed by specialist.

## 2013-07-28 NOTE — Assessment & Plan Note (Signed)
Physical exam within normal limits.  Mildly tachycardic.  Will obtain labs.  Repeat CXR to ensure resolution of pneumonia.

## 2013-08-02 ENCOUNTER — Telehealth: Payer: Self-pay

## 2013-08-02 NOTE — Telephone Encounter (Signed)
Patient left a message (right after a message was left from her husband) asking that [name] give her an RX for Tramadol?  I did look and patient does have this on med list but it came from [name], MD?  Please advise?

## 2013-08-02 NOTE — Telephone Encounter (Signed)
According to [place] Controlled Substance Database, patient was given 240 Oxycodone 5 mg tablets by her a Dr. Linton Rump that she just had filled on 07/17/13.  Prescription is written for a 20-day supply if she is taking 2 tablets every four hours each day.  She should not be due until 08/06/13.  If I write her a prescription for Tramadol, she will be unable to pick up medication until Friday. If she is having significant lingering pain from her surgery, I recommend that she speak with her surgeon Dr. Sharol Given or come in for me to evaluate her pain and surgery site.  Also, she did not obtain any of the lab work I sent her for at her hospital follow-up this past week.  This was very important and I recommend that she have it done.

## 2013-08-03 ENCOUNTER — Ambulatory Visit: Payer: Medicare HMO | Admitting: Physician Assistant

## 2013-08-03 ENCOUNTER — Ambulatory Visit (HOSPITAL_BASED_OUTPATIENT_CLINIC_OR_DEPARTMENT_OTHER)
Admission: RE | Admit: 2013-08-03 | Discharge: 2013-08-03 | Disposition: A | Discharge status: Home / Self Care | Payer: Medicare HMO | Source: Ambulatory Visit | Attending: Physician Assistant | Admitting: Physician Assistant

## 2013-08-03 DIAGNOSIS — R112 Nausea with vomiting, unspecified: Secondary | ICD-10-CM

## 2013-08-05 NOTE — Telephone Encounter (Signed)
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 Pt reports she only needed Tramadol for a migraine H/A at the time it was requested; she was reminded of consequences if Controlled Substance Contract is broken with [facility]

## 2013-08-10 ENCOUNTER — Other Ambulatory Visit: Payer: Self-pay | Admitting: Physician Assistant

## 2013-08-10 ENCOUNTER — Encounter: Payer: Self-pay | Admitting: Physician Assistant

## 2013-08-10 ENCOUNTER — Ambulatory Visit (INDEPENDENT_AMBULATORY_CARE_PROVIDER_SITE_OTHER): Payer: Medicare HMO | Admitting: Physician Assistant

## 2013-08-10 VITALS — BP 108/82 | HR 105 | Temp 99.1°F | Resp 16 | Ht 66.0 in | Wt 202.5 lb

## 2013-08-10 DIAGNOSIS — Z0184 Encounter for antibody response examination: Secondary | ICD-10-CM

## 2013-08-10 DIAGNOSIS — F319 Bipolar disorder, unspecified: Secondary | ICD-10-CM

## 2013-08-10 DIAGNOSIS — Z020 Encounter for examination for admission to educational institution: Secondary | ICD-10-CM | POA: Insufficient documentation

## 2013-08-10 DIAGNOSIS — Z0289 Encounter for other administrative examinations: Secondary | ICD-10-CM

## 2013-08-10 LAB — CBC WITH DIFFERENTIAL/PLATELET
Basophils Absolute: 0 10*3/uL (ref 0.0–0.1)
Basophils Relative: 0 % (ref 0–1)
Eosinophils Absolute: 0 10*3/uL (ref 0.0–0.7)
Eosinophils Relative: 0 % (ref 0–5)
HCT: 37.2 % (ref 36.0–46.0)
HEMOGLOBIN: 12.7 g/dL (ref 12.0–15.0)
LYMPHS ABS: 0.9 10*3/uL (ref 0.7–4.0)
LYMPHS PCT: 25 % (ref 12–46)
MCH: 27.9 pg (ref 26.0–34.0)
MCHC: 34.1 g/dL (ref 30.0–36.0)
MCV: 81.6 fL (ref 78.0–100.0)
MONOS PCT: 5 % (ref 3–12)
Monocytes Absolute: 0.2 10*3/uL (ref 0.1–1.0)
NEUTROS PCT: 70 % (ref 43–77)
Neutro Abs: 2.6 10*3/uL (ref 1.7–7.7)
PLATELETS: 190 10*3/uL (ref 150–400)
RBC: 4.56 MIL/uL (ref 3.87–5.11)
RDW: 15.1 % (ref 11.5–15.5)
WBC: 3.7 10*3/uL — AB (ref 4.0–10.5)

## 2013-08-10 LAB — COMPREHENSIVE METABOLIC PANEL
ALT: 16 U/L (ref 0–35)
AST: 16 U/L (ref 0–37)
Albumin: 4.7 g/dL (ref 3.5–5.2)
Alkaline Phosphatase: 61 U/L (ref 39–117)
BUN: 18 mg/dL (ref 6–23)
CALCIUM: 9.8 mg/dL (ref 8.4–10.5)
CHLORIDE: 102 meq/L (ref 96–112)
CO2: 29 meq/L (ref 19–32)
Creat: 1.01 mg/dL (ref 0.50–1.10)
Glucose, Bld: 103 mg/dL — ABNORMAL HIGH (ref 70–99)
Potassium: 4.3 mEq/L (ref 3.5–5.3)
SODIUM: 140 meq/L (ref 135–145)
TOTAL PROTEIN: 7 g/dL (ref 6.0–8.3)
Total Bilirubin: 0.4 mg/dL (ref 0.2–1.2)

## 2013-08-10 LAB — LIPASE: LIPASE: 18 U/L (ref 0–75)

## 2013-08-10 NOTE — Patient Instructions (Signed)
Please obtain labs.  I will finish your paperwork once all labs have resulted.  Please return in 48 hours for TB test reading.  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

## 2013-08-10 NOTE — Assessment & Plan Note (Signed)
Will obtain MMR, Varicella and Hep B titers.

## 2013-08-10 NOTE — Addendum Note (Signed)
Addended by: Raiford Noble on: 08/10/2013 11:58 AM   Modules accepted: Orders

## 2013-08-10 NOTE — Assessment & Plan Note (Signed)
PE within normal limits.  Will obtain Gold's assay for Tb screening.

## 2013-08-10 NOTE — Assessment & Plan Note (Signed)
Referral to psychiatry placed.

## 2013-08-10 NOTE — Progress Notes (Signed)
Patient presents to clinic today for pre-admission school physical and immunity-status testing.  Patient also requesting referral to new psychiatrist for her bipolar disorder as the specialist she currently sees is in [facility], [place] which is too far away.   Past Medical History  Diagnosis Date  . Dysrhythmia     Left sided heart diease  . Mental disorder   . Bipolar affective disorder, mixed, in partial or remission   . Headache(784.0)   . Anxiety   . Depression   . 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 History of gallstones   . PONV (postoperative nausea and vomiting)   . Sleep apnea   . Renal disorder   . Kidney stones   . Pneumonia     Hx: of as a child  . Migraine     Current Outpatient Prescriptions on File Prior to Visit  Medication Sig Dispense Refill  . hydrOXYzine (VISTARIL) 25 MG capsule Take 25 mg by mouth at bedtime.      Marland Kitchen lithium carbonate (LITHOBID) 300 MG CR tablet Take 300 mg by mouth every evening.       Marland Kitchen oxyCODONE (OXY IR/ROXICODONE) 5 MG immediate release tablet Take 1 tablet by mouth every 6 (six) hours as needed for moderate pain.       . potassium chloride SA (K-DUR,KLOR-CON) 20 MEQ tablet Take 2 tablets (40 mEq total) by mouth daily.  20 tablet  0  . pregabalin (LYRICA) 100 MG capsule Take 1 capsule (100 mg total) by mouth 2 (two) times daily.  60 capsule  0  . promethazine (PHENERGAN) 25 MG tablet Take 1 tablet (25 mg total) by mouth every 6 (six) hours as needed for nausea or vomiting.  30 tablet  0  . QUEtiapine (SEROQUEL) 400 MG tablet Take 800 mg by mouth at bedtime.       . SUMAtriptan (IMITREX) 25 MG tablet TAKE 1 TABLET BY MOUTH ONCE. MAY REPEAT IN 2 HOURS IF HEADACHE PERSISTS OR RECURS.  10 tablet  0  . traMADol (ULTRAM) 50 MG tablet Take 1 tablet (50 mg total) by mouth every 6 (six) hours as needed for moderate pain.  30 tablet  0  . traZODone (DESYREL) 100 MG tablet Take 400 mg by mouth at bedtime.      Marland Kitchen zolpidem (AMBIEN)  10 MG tablet Take 10 mg by mouth at bedtime as needed for sleep. For sleep       No current facility-administered medications on file prior to visit.    No Known Allergies  Family History  Problem Relation Age of Onset  . Colon cancer Father 59  . Stomach cancer Neg Hx   . Rectal cancer Neg Hx   . Esophageal cancer Neg Hx   . Breast cancer Mother   . Breast cancer Father   . Uterine cancer Mother     History   Social History  . Marital Status: Married    Spouse Name: N/A    Number of Children: 6  . Years of Education: N/A   Occupational History  . nursing student    Social History Main Topics  . Smoking status: Never Smoker   . Smokeless tobacco: Never Used  . Alcohol Use: No  . Drug Use: No     Comment: see triage note  . Sexual Activity: No   Other Topics Concern  . 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  All other ROS are negative.  BP 108/82  Pulse 105  Temp(Src) 99.1 F (37.3 C) (Oral)  Resp 16  Ht _0  (1.676 m)  Wt 202 lb 8 oz (91.853 kg)  BMI 32.70 kg/m2  SpO2 98%  Physical Exam  Vitals reviewed. Constitutional: She is oriented to person, place, and time and well-developed, well-nourished, and in no distress.  HENT:  Head: Normocephalic and atraumatic.  Right Ear: External ear normal.  Left Ear: External ear normal.  Nose: Nose normal.  Mouth/Throat: Oropharynx is clear and moist. No oropharyngeal exudate.  TM within normal limits bilaterally.   Eyes: Conjunctivae and EOM are normal. Pupils are equal, round, and reactive to light.  Neck: Normal range of motion. Neck supple. No thyromegaly present.  Cardiovascular: Normal rate, regular rhythm, normal  heart sounds and intact distal pulses.   Pulmonary/Chest: Effort normal and breath sounds normal. No respiratory distress. She has no wheezes. She has no rales. She exhibits no tenderness.  Abdominal: Soft. Bowel sounds are normal. She exhibits no distension and no mass. There is no tenderness. There is no rebound and no guarding.  Genitourinary:  Deferred to OB/GYN per patient.   Lymphadenopathy:    She has no cervical adenopathy.  Neurological: She is alert and oriented to person, place, and time.  Skin: Skin is warm and dry. No rash noted.  Psychiatric: Affect normal.    Recent Results (from the past 2160 hour(s))  CBC WITH DIFFERENTIAL     Status: Abnormal   Collection Time    06/30/13  8:55 PM      Result Value Ref Range   WBC 9.1  4.0 - 10.5 K/uL   Comment: WHITE COUNT CONFIRMED ON SMEAR   RBC 4.98  3.87 - 5.11 MIL/uL   Hemoglobin 14.1  12.0 - 15.0 g/dL   HCT 41.7  36.0 - 46.0 %   MCV 83.7  78.0 - 100.0 fL   MCH 28.3  26.0 - 34.0 pg   MCHC 33.8  30.0 - 36.0 g/dL   RDW 13.3  11.5 - 15.5 %   Platelets 122 (*) 150 - 400 K/uL   Neutrophils Relative % 89 (*) 43 - 77 %   Lymphocytes Relative 5 (*) 12 - 46 %   Monocytes Relative 6  3 - 12 %   Eosinophils Relative 0  0 - 5 %   Basophils Relative 0  0 - 1 %   Neutro Abs 8.1 (*) 1.7 - 7.7 K/uL   Lymphs Abs 0.5 (*) 0.7 - 4.0 K/uL   Monocytes Absolute 0.5  0.1 - 1.0 K/uL   Eosinophils Absolute 0.0  0.0 - 0.7 K/uL   Basophils Absolute 0.0  0.0 - 0.1 K/uL   WBC Morphology INCREASED BANDS (>20% BANDS)     Smear Review LARGE PLATELETS PRESENT    COMPREHENSIVE METABOLIC PANEL     Status: Abnormal   Collection Time    06/30/13  8:55 PM      Result Value Ref Range   Sodium 139  137 - 147 mEq/L   Potassium 4.3  3.7 - 5.3 mEq/L  Comment: SLIGHT HEMOLYSIS     HEMOLYSIS AT THIS LEVEL MAY AFFECT RESULT   Chloride 100  96 - 112 mEq/L   CO2 26  19 - 32 mEq/L   Glucose, Bld 158 (*) 70 - 99 mg/dL   BUN 14  6 - 23 mg/dL   Creatinine, Ser  0.88  0.50 - 1.10 mg/dL   Calcium 9.7  8.4 - 10.5 mg/dL   Total Protein 7.8  6.0 - 8.3 g/dL   Albumin 4.0  3.5 - 5.2 g/dL   AST 26  0 - 37 U/L   Comment: SLIGHT HEMOLYSIS     HEMOLYSIS AT THIS LEVEL MAY AFFECT RESULT   ALT 18  0 - 35 U/L   Comment: SLIGHT HEMOLYSIS     HEMOLYSIS AT THIS LEVEL MAY AFFECT RESULT   Alkaline Phosphatase 71  39 - 117 U/L   Total Bilirubin 0.5  0.3 - 1.2 mg/dL   GFR calc non Af Amer 77 (*) >90 mL/min   GFR calc Af Amer 89 (*) >90 mL/min   Comment: (NOTE)     The eGFR has been calculated using the CKD EPI equation.     This calculation has not been validated in all clinical situations.     eGFR's persistently <90 mL/min signify possible Chronic Kidney     Disease.  CULTURE, BLOOD (ROUTINE X 2)     Status: None   Collection Time    06/30/13  8:56 PM      Result Value Ref Range   Specimen Description BLOOD CENTRAL LINE     Special Requests BOTTLES DRAWN AEROBIC ONLY 4CC     Culture  Setup Time       Value: 07/01/2013 01:20     Performed at [facility]   Culture       Value: STAPHYLOCOCCUS SPECIES (COAGULASE NEGATIVE)     Note: RIFAMPIN AND GENTAMICIN SHOULD NOT BE USED AS SINGLE DRUGS FOR TREATMENT OF STAPH INFECTIONS.     17 Note: Gram Stain Report Called to,Read Back By and Verified With: MICHELLE REVES RN [id number] EDMOJ     Performed at [facility]   Report Status 07/03/2013 FINAL     Organism ID, Bacteria STAPHYLOCOCCUS SPECIES (COAGULASE NEGATIVE)    I-STAT CG4 LACTIC ACID, ED     Status: None   Collection Time    06/30/13  9:00 PM      Result Value Ref Range   Lactic Acid, Venous 0.79  0.5 - 2.2 mmol/L  CULTURE, BLOOD (ROUTINE X 2)     Status: None   Collection Time    06/30/13  9:45 PM      Result Value Ref Range   Specimen Description BLOOD BLOOD RIGHT WRIST     Special Requests BOTTLES DRAWN AEROBIC AND ANAEROBIC 5CC     Culture  Setup Time       Value: 07/01/2013 01:20     Performed at [facility]    Culture       Value: STAPHYLOCOCCUS SPECIES (COAGULASE NEGATIVE)     Note: SUSCEPTIBILITIES PERFORMED ON PREVIOUS CULTURE WITHIN THE LAST 5 DAYS.     0335 Note: Gram Stain Report Called to,Read Back By and Verified With: MICHELLE REVES 07/02/13 [facility]     Performed at [facility]   Report Status 07/03/2013 FINAL    URINALYSIS, ROUTINE W REFLEX MICROSCOPIC     Status: Abnormal   Collection Time    06/30/13 10:38  PM      Result Value Ref Range   Color, Urine AMBER (*) YELLOW   Comment: BIOCHEMICALS MAY BE AFFECTED BY COLOR   APPearance TURBID (*) CLEAR   Specific Gravity, Urine 1.027  1.005 - 1.030   pH 5.0  5.0 - 8.0   Glucose, UA NEGATIVE  NEGATIVE mg/dL   Hgb urine dipstick NEGATIVE  NEGATIVE   Bilirubin Urine SMALL (*) NEGATIVE   Ketones, ur NEGATIVE  NEGATIVE mg/dL   Protein, ur 30 (*) NEGATIVE mg/dL   Urobilinogen, UA 0.2  0.0 - 1.0 mg/dL   Nitrite NEGATIVE  NEGATIVE   Leukocytes, UA NEGATIVE  NEGATIVE  URINE CULTURE     Status: None   Collection Time    06/30/13 10:38 PM      Result Value Ref Range   Specimen Description URINE, CATHETERIZED     Special Requests NONE     Culture  Setup Time       Value: 07/01/2013 01:25     Performed at [place]       Value: NO GROWTH     Performed at [facility]   Culture       Value: NO GROWTH     Performed at [facility]   Report Status 07/02/2013 FINAL    URINE MICROSCOPIC-ADD ON     Status: Abnormal   Collection Time    06/30/13 10:38 PM      Result Value Ref Range   Squamous Epithelial / LPF RARE  RARE   WBC, UA 0-2  <3 WBC/hpf   RBC / HPF 0-2  <3 RBC/hpf   Bacteria, UA MANY (*) RARE   Casts HYALINE CASTS (*) NEGATIVE   Urine-Other MUCOUS PRESENT     Comment: AMORPHOUS URATES/PHOSPHATES  LEGIONELLA ANTIGEN, URINE     Status: None   Collection Time    07/01/13 12:45 AM      Result Value Ref Range   Specimen Description URINE, CATHETERIZED     Special Requests NONE      Legionella Antigen, Urine       Value: Negative for Legionella pneumophilia serogroup 1     Performed at [facility]   Report Status 07/02/2013 FINAL    STREP PNEUMONIAE URINARY ANTIGEN     Status: None   Collection Time    07/01/13 12:45 AM      Result Value Ref Range   Strep Pneumo Urinary Antigen NEGATIVE  NEGATIVE   Comment:            Infection due to S. pneumoniae     cannot be absolutely ruled out     since the antigen present     may be below the detection limit     of the test.     (NOTE)     PERFORMED AT [facility]     Performed at [facility]  MRSA PCR SCREENING     Status: None   Collection Time    07/01/13  1:58 AM      Result Value Ref Range   MRSA by PCR NEGATIVE  NEGATIVE   Comment:            The GeneXpert MRSA Assay (FDA     approved for NASAL specimens     only), is one component of a     comprehensive MRSA colonization     surveillance program. It is not     intended  to diagnose MRSA     infection nor to guide or     monitor treatment for     MRSA infections.  CBC WITH DIFFERENTIAL     Status: Abnormal   Collection Time    07/02/13  5:15 AM      Result Value Ref Range   WBC 5.2  4.0 - 10.5 K/uL   RBC 3.55 (*) 3.87 - 5.11 MIL/uL   Hemoglobin 10.0 (*) 12.0 - 15.0 g/dL   Comment: DELTA CHECK NOTED     REPEATED TO VERIFY   HCT 29.5 (*) 36.0 - 46.0 %   MCV 83.1  78.0 - 100.0 fL   MCH 28.2  26.0 - 34.0 pg   MCHC 33.9  30.0 - 36.0 g/dL   RDW 13.2  11.5 - 15.5 %   Platelets 74 (*) 150 - 400 K/uL   Comment: DELTA CHECK NOTED     REPEATED TO VERIFY     SPECIMEN CHECKED FOR CLOTS     PLATELET COUNT CONFIRMED BY SMEAR   Neutrophils Relative % 73  43 - 77 %   Neutro Abs 3.8  1.7 - 7.7 K/uL   Lymphocytes Relative 21  12 - 46 %   Lymphs Abs 1.1  0.7 - 4.0 K/uL   Monocytes Relative 6  3 - 12 %   Monocytes Absolute 0.3  0.1 - 1.0 K/uL   Eosinophils Relative 0  0 - 5 %   Eosinophils Absolute 0.0  0.0 - 0.7 K/uL   Basophils Relative 0  0 - 1 %    Basophils Absolute 0.0  0.0 - 0.1 K/uL  COMPREHENSIVE METABOLIC PANEL     Status: Abnormal   Collection Time    07/02/13  5:15 AM      Result Value Ref Range   Sodium 144  137 - 147 mEq/L   Potassium 3.5 (*) 3.7 - 5.3 mEq/L   Comment: RESULT REPEATED AND VERIFIED     DELTA CHECK NOTED   Chloride 110  96 - 112 mEq/L   Comment: RESULT REPEATED AND VERIFIED     DELTA CHECK NOTED   CO2 25  19 - 32 mEq/L   Glucose, Bld 94  70 - 99 mg/dL   BUN 8  6 - 23 mg/dL   Creatinine, Ser 0.66  0.50 - 1.10 mg/dL   Calcium 7.9 (*) 8.4 - 10.5 mg/dL   Total Protein 5.4 (*) 6.0 - 8.3 g/dL   Albumin 2.5 (*) 3.5 - 5.2 g/dL   AST 14  0 - 37 U/L   ALT 15  0 - 35 U/L   Alkaline Phosphatase 51  39 - 117 U/L   Total Bilirubin 0.3  0.3 - 1.2 mg/dL   GFR calc non Af Amer >90  >90 mL/min   GFR calc Af Amer >90  >90 mL/min   Comment: (NOTE)     The eGFR has been calculated using the CKD EPI equation.     This calculation has not been validated in all clinical situations.     eGFR's persistently <90 mL/min signify possible Chronic Kidney     Disease.  HEPARIN INDUCED THROMBOCYTOPENIA PNL     Status: None   Collection Time    07/02/13  8:45 AM      Result Value Ref Range   Heparin Induced Plt Ab Negative  Negative   Comment: (NOTE)     This test is approximately 95% sensitive for     detecting  antibodies to the Platelet Factor 4/Heparin     Complex (PF4/Heparin). Although the result of     this patient's sample is negative, a small     percentage of patients may still be positive     by the Serotonin Release Assay (SRA), possibly     due to other target antigens involved in this     reaction such as Interleukin-8 or Neutrophil-     Activating Peptide-2 (NAP-2). A clinical risk     assessment score may provide additional guidance     in deciding whether additional testing is of     value (J Thromb Haemost 2008;6:1304-12).   Patient O.D. 0.124     Comment: (NOTE)      ______________________________________     !   O.D.units        !    Result       !     !____________________!_________________!     ! <=0.300            !   Negative      !     !  >0.300 to <=0.500 !  Weak Positive  !     !  >0.500            !   Positive      !     !____________________!_________________!     For more information on this test, go to:     http://education.[facility].com/faq/FAQ06V1   UFH SRA Result Negative  Negative   Comment: (NOTE)     A sample is considered negative if there is     <20% release.     The SRA has a sensitivity (88-100%) and specificity     (89-100%) for Heparin Induced Thrombocytopenia (HIT)     Rondel Baton, Hematology Am Soc Hematol Educ     Program. 2009; 225-232).   UFH Low Dose 0.1 IU/mL 0     UFH Low Dose 0.5 IU/mL 0     UFH High Dose UFH H 0     Comment: (NOTE)     This test was developed and its performance     characteristics have been determined by [employer], [place], [place].     It has not been cleared or approved by the U.S.     Food and Drug Administration. The FDA has determined     that such clearance or approval is not necessary.     Performance characteristics refer to the analytical     performance of the test.     For more information on this test, go to:     http://education.[facility].com/faq/FAQ06V1     Performed at [facility]  CBC     Status: Abnormal   Collection Time    07/03/13  5:25 AM      Result Value Ref Range   WBC 3.9 (*) 4.0 - 10.5 K/uL   RBC 3.66 (*) 3.87 - 5.11 MIL/uL   Hemoglobin 10.3 (*) 12.0 - 15.0 g/dL   HCT 29.9 (*) 36.0 - 46.0 %   MCV 81.7  78.0 - 100.0 fL   MCH 28.1  26.0 - 34.0 pg   MCHC 34.4  30.0 - 36.0 g/dL   RDW 12.9  11.5 - 15.5 %   Platelets 93 (*) 150 - 400 K/uL   Comment: CONSISTENT WITH PREVIOUS RESULT  COMPREHENSIVE METABOLIC PANEL     Status: Abnormal   Collection Time  07/03/13  5:25 AM      Result Value Ref Range   Sodium  142  137 - 147 mEq/L   Potassium 3.1 (*) 3.7 - 5.3 mEq/L   Chloride 107  96 - 112 mEq/L   CO2 26  19 - 32 mEq/L   Glucose, Bld 93  70 - 99 mg/dL   BUN 8  6 - 23 mg/dL   Creatinine, Ser 0.69  0.50 - 1.10 mg/dL   Calcium 8.2 (*) 8.4 - 10.5 mg/dL   Total Protein 5.8 (*) 6.0 - 8.3 g/dL   Albumin 2.8 (*) 3.5 - 5.2 g/dL   AST 12  0 - 37 U/L   ALT 12  0 - 35 U/L   Alkaline Phosphatase 62  39 - 117 U/L   Total Bilirubin 0.3  0.3 - 1.2 mg/dL   GFR calc non Af Amer >90  >90 mL/min   GFR calc Af Amer >90  >90 mL/min   Comment: (NOTE)     The eGFR has been calculated using the CKD EPI equation.     This calculation has not been validated in all clinical situations.     eGFR's persistently <90 mL/min signify possible Chronic Kidney     Disease.  COMPREHENSIVE METABOLIC PANEL     Status: Abnormal   Collection Time    07/04/13  4:25 AM      Result Value Ref Range   Sodium 145  137 - 147 mEq/L   Potassium 3.3 (*) 3.7 - 5.3 mEq/L   Chloride 110  96 - 112 mEq/L   CO2 24  19 - 32 mEq/L   Glucose, Bld 95  70 - 99 mg/dL   BUN 12  6 - 23 mg/dL   Creatinine, Ser 0.88  0.50 - 1.10 mg/dL   Calcium 8.5  8.4 - 10.5 mg/dL   Total Protein 6.2  6.0 - 8.3 g/dL   Albumin 3.0 (*) 3.5 - 5.2 g/dL   AST 12  0 - 37 U/L   ALT 13  0 - 35 U/L   Alkaline Phosphatase 60  39 - 117 U/L   Total Bilirubin 0.3  0.3 - 1.2 mg/dL   GFR calc non Af Amer 77 (*) >90 mL/min   GFR calc Af Amer 89 (*) >90 mL/min   Comment: (NOTE)     The eGFR has been calculated using the CKD EPI equation.     This calculation has not been validated in all clinical situations.     eGFR's persistently <90 mL/min signify possible Chronic Kidney     Disease.  CBC     Status: Abnormal   Collection Time    07/04/13  4:25 AM      Result Value Ref Range   WBC 4.3  4.0 - 10.5 K/uL   RBC 3.87  3.87 - 5.11 MIL/uL   Hemoglobin 10.9 (*) 12.0 - 15.0 g/dL   HCT 31.0 (*) 36.0 - 46.0 %   MCV 80.1  78.0 - 100.0 fL   MCH 28.2  26.0 - 34.0 pg   MCHC  35.2  30.0 - 36.0 g/dL   RDW 12.8  11.5 - 15.5 %   Platelets 126 (*) 150 - 400 K/uL   Comment: DELTA CHECK NOTED     REPEATED TO VERIFY    Assessment/Plan: Bipolar 1 disorder Referral to psychiatry placed.   Immunity status testing Will obtain MMR, Varicella and Hep B titers.   School physical exam PE within  normal limits.  Will obtain Gold's assay for Tb screening.

## 2013-08-10 NOTE — Progress Notes (Signed)
Pre visit review using our clinic review tool, if applicable. No additional management support is needed unless otherwise documented below in the visit note/SLS  

## 2013-08-11 ENCOUNTER — Other Ambulatory Visit: Payer: Self-pay | Admitting: Physician Assistant

## 2013-08-11 LAB — HEPATITIS B SURFACE ANTIBODY,QUALITATIVE: Hep B S Ab: POSITIVE — AB

## 2013-08-11 LAB — MEASLES/MUMPS/RUBELLA IMMUNITY
Mumps IgG: 20.7 AU/mL — ABNORMAL HIGH (ref <9.00)
Rubella: 1.98 Index — ABNORMAL HIGH (ref <0.90)

## 2013-08-11 LAB — VARICELLA ZOSTER ANTIBODY, IGG: VARICELLA IGG: 897.5 {index} — AB (ref <135.00)

## 2013-08-11 LAB — HEPATITIS B SURFACE ANTIGEN: Hepatitis B Surface Ag: NEGATIVE

## 2013-08-12 LAB — QUANTIFERON TB GOLD ASSAY (BLOOD)
Interferon Gamma Release Assay: NEGATIVE
MITOGEN VALUE: 1.78 [IU]/mL
Quantiferon Nil Value: 0.02 IU/mL
Quantiferon Tb Ag Minus Nil Value: 0 IU/mL
TB Ag value: 0.01 IU/mL

## 2013-09-08 ENCOUNTER — Encounter: Payer: Self-pay | Admitting: Physician Assistant

## 2013-09-08 ENCOUNTER — Ambulatory Visit (INDEPENDENT_AMBULATORY_CARE_PROVIDER_SITE_OTHER): Payer: Medicare PPO | Admitting: Physician Assistant

## 2013-09-08 VITALS — BP 98/74 | HR 79 | Temp 97.8°F | Resp 16 | Ht 66.0 in | Wt 205.0 lb

## 2013-09-08 DIAGNOSIS — R399 Unspecified symptoms and signs involving the genitourinary system: Secondary | ICD-10-CM | POA: Insufficient documentation

## 2013-09-08 DIAGNOSIS — R8290 Unspecified abnormal findings in urine: Secondary | ICD-10-CM

## 2013-09-08 DIAGNOSIS — R3989 Other symptoms and signs involving the genitourinary system: Secondary | ICD-10-CM

## 2013-09-08 DIAGNOSIS — R82998 Other abnormal findings in urine: Secondary | ICD-10-CM

## 2013-09-08 LAB — POCT URINALYSIS DIPSTICK
Bilirubin, UA: NEGATIVE
GLUCOSE UA: NEGATIVE
Ketones, UA: NEGATIVE
Nitrite, UA: NEGATIVE
Protein, UA: NEGATIVE
Spec Grav, UA: >=1.03
UROBILINOGEN UA: 0.2
pH, UA: 5

## 2013-09-08 MED ORDER — CIPROFLOXACIN HCL 500 MG PO TABS: 500.0000 mg | ORAL_TABLET | Freq: Two times a day (BID) | ORAL | Status: DC

## 2013-09-08 NOTE — Assessment & Plan Note (Signed)
Rx Cipro.  Increase fluids.  Probiotic and Cranberry supplement.  Will send urine for micro and culture.

## 2013-09-08 NOTE — Progress Notes (Signed)
Patient presents to clinic today c/o low back pain x 3 days associated with urinary frequency.  Denies dysuria or hematuria.  Denies fever, chills, emesis.  Has chronic nausea for which she is supposed to be having worked up further by GI.    Past Medical History  Diagnosis Date  . Dysrhythmia     Left sided heart diease  . Mental disorder   . Bipolar affective disorder, mixed, in partial or remission   . Headache(784.0)   . Anxiety   . Depression   . 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 History of gallstones   . PONV (postoperative nausea and vomiting)   . Sleep apnea   . Renal disorder   . Kidney stones   . Pneumonia     Hx: of as a child  . Migraine     Current Outpatient Prescriptions on File Prior to Visit  Medication Sig Dispense Refill  . hydrOXYzine (VISTARIL) 25 MG capsule Take 25 mg by mouth at bedtime.      Marland Kitchen lithium carbonate (LITHOBID) 300 MG CR tablet Take 300 mg by mouth every evening.       . potassium chloride SA (K-DUR,KLOR-CON) 20 MEQ tablet Take 2 tablets (40 mEq total) by mouth daily.  20 tablet  0  . pregabalin (LYRICA) 100 MG capsule Take 1 capsule (100 mg total) by mouth 2 (two) times daily.  60 capsule  0  . promethazine (PHENERGAN) 25 MG tablet Take 1 tablet (25 mg total) by mouth every 6 (six) hours as needed for nausea or vomiting.  30 tablet  0  . QUEtiapine (SEROQUEL) 400 MG tablet Take 800 mg by mouth at bedtime.       . SUMAtriptan (IMITREX) 25 MG tablet TAKE 1 TABLET BY MOUTH ONCE. MAY REPEAT IN 2 HOURS IF HEADACHE PERSISTS OR RECURS.  10 tablet  0  . traMADol (ULTRAM) 50 MG tablet Take 1 tablet (50 mg total) by mouth every 6 (six) hours as needed for moderate pain.  30 tablet  0  . traZODone (DESYREL) 100 MG tablet Take 400 mg by mouth at bedtime.      Marland Kitchen zolpidem (AMBIEN) 10 MG tablet Take 10 mg by mouth at bedtime as needed for sleep. For sleep       No current facility-administered medications on file prior to visit.     No Known Allergies  Family History  Problem Relation Age of Onset  . Colon cancer Father 43  . Stomach cancer Neg Hx   . Rectal cancer Neg Hx   . Esophageal cancer Neg Hx   . Breast cancer Mother   . Breast cancer Father   . Uterine cancer Mother     History   Social History  . Marital Status: Married    Spouse Name: N/A    Number of Children: 6  . Years of Education: N/A   Occupational History  . nursing student    Social History Main Topics  . Smoking status: Never Smoker   . Smokeless tobacco: Never Used  . Alcohol Use: No  . Drug Use: No     Comment: see triage note  . Sexual Activity: No   Other Topics Concern  . 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  All other ROS are negative.  BP 98/74  Pulse 79  Temp(Src) 97.8 F (36.6 C) (Oral)  Resp 16  Ht _0  (1.676 m)  Wt 205 lb (92.987 kg)  BMI 33.10 kg/m2  SpO2 98%  Physical Exam  Vitals reviewed. Constitutional: She is well-developed, well-nourished, and in no distress.  HENT:  Head: Normocephalic and atraumatic.  Eyes: Conjunctivae are normal.  Cardiovascular: Normal rate, regular rhythm, normal heart sounds and intact distal pulses.   Pulmonary/Chest: Effort normal and breath sounds normal. No respiratory distress. She has no wheezes. She has no rales. She exhibits no tenderness.  Abdominal: Soft. Bowel sounds are normal. She exhibits no distension and no mass. There is no tenderness. There is no rebound and no guarding.  Negative CVA tenderness  Skin: Skin is warm and dry. No rash noted.  Psychiatric: Affect normal.   Recent Results (from the past 2160 hour(s))  CBC WITH DIFFERENTIAL     Status:  Abnormal   Collection Time    06/30/13  8:55 PM      Result Value Ref Range   WBC 9.1  4.0 - 10.5 K/uL   Comment: WHITE COUNT CONFIRMED ON SMEAR   RBC 4.98  3.87 - 5.11 MIL/uL   Hemoglobin 14.1  12.0 - 15.0 g/dL   HCT 41.7  36.0 - 46.0 %   MCV 83.7  78.0 - 100.0 fL   MCH 28.3  26.0 - 34.0 pg   MCHC 33.8  30.0 - 36.0 g/dL   RDW 13.3  11.5 - 15.5 %   Platelets 122 (*) 150 - 400 K/uL   Neutrophils Relative % 89 (*) 43 - 77 %   Lymphocytes Relative 5 (*) 12 - 46 %   Monocytes Relative 6  3 - 12 %   Eosinophils Relative 0  0 - 5 %   Basophils Relative 0  0 - 1 %   Neutro Abs 8.1 (*) 1.7 - 7.7 K/uL   Lymphs Abs 0.5 (*) 0.7 - 4.0 K/uL   Monocytes Absolute 0.5  0.1 - 1.0 K/uL   Eosinophils Absolute 0.0  0.0 - 0.7 K/uL   Basophils Absolute 0.0  0.0 - 0.1 K/uL   WBC Morphology INCREASED BANDS (>20% BANDS)     Smear Review LARGE PLATELETS PRESENT    COMPREHENSIVE METABOLIC PANEL     Status: Abnormal   Collection Time    06/30/13  8:55 PM      Result Value Ref Range   Sodium 139  137 - 147 mEq/L   Potassium 4.3  3.7 - 5.3 mEq/L   Comment: SLIGHT HEMOLYSIS     HEMOLYSIS AT THIS LEVEL MAY AFFECT RESULT   Chloride 100  96 - 112 mEq/L   CO2 26  19 - 32 mEq/L   Glucose, Bld 158 (*) 70 - 99 mg/dL   BUN 14  6 - 23 mg/dL   Creatinine, Ser 0.88  0.50 - 1.10 mg/dL   Calcium 9.7  8.4 - 10.5 mg/dL   Total Protein 7.8  6.0 - 8.3 g/dL   Albumin 4.0  3.5 - 5.2 g/dL   AST 26  0 - 37 U/L   Comment: SLIGHT HEMOLYSIS     HEMOLYSIS AT THIS LEVEL MAY AFFECT  RESULT   ALT 18  0 - 35 U/L   Comment: SLIGHT HEMOLYSIS     HEMOLYSIS AT THIS LEVEL MAY AFFECT RESULT   Alkaline Phosphatase 71  39 - 117 U/L   Total Bilirubin 0.5  0.3 - 1.2 mg/dL   GFR calc non Af Amer 77 (*) >90 mL/min   GFR calc Af Amer 89 (*) >90 mL/min   Comment: (NOTE)     The eGFR has been calculated using the CKD EPI equation.     This calculation has not been validated in all clinical situations.     eGFR's persistently <90 mL/min  signify possible Chronic Kidney     Disease.  CULTURE, BLOOD (ROUTINE X 2)     Status: None   Collection Time    06/30/13  8:56 PM      Result Value Ref Range   Specimen Description BLOOD CENTRAL LINE     Special Requests BOTTLES DRAWN AEROBIC ONLY 4CC     Culture  Setup Time       Value: 07/01/2013 01:20     Performed at [facility]   Culture       Value: STAPHYLOCOCCUS SPECIES (COAGULASE NEGATIVE)     Note: RIFAMPIN AND GENTAMICIN SHOULD NOT BE USED AS SINGLE DRUGS FOR TREATMENT OF STAPH INFECTIONS.     17 Note: Gram Stain Report Called to,Read Back By and Verified With: MICHELLE REVES RN 4 15 [id number] EDMOJ     Performed at [facility]   Report Status 07/03/2013 FINAL     Organism ID, Bacteria STAPHYLOCOCCUS SPECIES (COAGULASE NEGATIVE)    I-STAT CG4 LACTIC ACID, ED     Status: None   Collection Time    06/30/13  9:00 PM      Result Value Ref Range   Lactic Acid, Venous 0.79  0.5 - 2.2 mmol/L  CULTURE, BLOOD (ROUTINE X 2)     Status: None   Collection Time    06/30/13  9:45 PM      Result Value Ref Range   Specimen Description BLOOD BLOOD RIGHT WRIST     Special Requests BOTTLES DRAWN AEROBIC AND ANAEROBIC 5CC     Culture  Setup Time       Value: 07/01/2013 01:20     Performed at [facility]   Culture       Value: STAPHYLOCOCCUS SPECIES (COAGULASE NEGATIVE)     Note: SUSCEPTIBILITIES PERFORMED ON PREVIOUS CULTURE WITHIN THE LAST 5 DAYS.     0335 Note: Gram Stain Report Called to,Read Back By and Verified With: MICHELLE REVES 07/02/13 [facility]     Performed at [facility]   Report Status 07/03/2013 FINAL    URINALYSIS, ROUTINE W REFLEX MICROSCOPIC     Status: Abnormal   Collection Time    06/30/13 10:38 PM      Result Value Ref Range   Color, Urine AMBER (*) YELLOW   Comment: BIOCHEMICALS MAY BE AFFECTED BY COLOR   APPearance TURBID (*) CLEAR   Specific Gravity, Urine 1.027  1.005 - 1.030   pH 5.0  5.0 - 8.0   Glucose, UA NEGATIVE  NEGATIVE  mg/dL   Hgb urine dipstick NEGATIVE  NEGATIVE   Bilirubin Urine SMALL (*) NEGATIVE   Ketones, ur NEGATIVE  NEGATIVE mg/dL   Protein, ur 30 (*) NEGATIVE mg/dL   Urobilinogen, UA 0.2  0.0 - 1.0 mg/dL   Nitrite NEGATIVE  NEGATIVE   Leukocytes, UA NEGATIVE  NEGATIVE  URINE CULTURE     Status: None   Collection Time    06/30/13 10:38 PM      Result Value Ref Range   Specimen Description URINE, CATHETERIZED     Special Requests NONE     Culture  Setup Time       Value: 07/01/2013 01:25     Performed at [facility]       Value: NO GROWTH     Performed at [facility]   Culture       Value: NO GROWTH     Performed at [facility]   Report Status 07/02/2013 FINAL    URINE MICROSCOPIC-ADD ON     Status: Abnormal   Collection Time    06/30/13 10:38 PM      Result Value Ref Range   Squamous Epithelial / LPF RARE  RARE   WBC, UA 0-2  <3 WBC/hpf   RBC / HPF 0-2  <3 RBC/hpf   Bacteria, UA MANY (*) RARE   Casts HYALINE CASTS (*) NEGATIVE   Urine-Other MUCOUS PRESENT     Comment: AMORPHOUS URATES/PHOSPHATES  LEGIONELLA ANTIGEN, URINE     Status: None   Collection Time    07/01/13 12:45 AM      Result Value Ref Range   Specimen Description URINE, CATHETERIZED     Special Requests NONE     Legionella Antigen, Urine       Value: Negative for Legionella pneumophilia serogroup 1     Performed at [facility]   Report Status 07/02/2013 FINAL    STREP PNEUMONIAE URINARY ANTIGEN     Status: None   Collection Time    07/01/13 12:45 AM      Result Value Ref Range   Strep Pneumo Urinary Antigen NEGATIVE  NEGATIVE   Comment:            Infection due to S. pneumoniae     cannot be absolutely ruled out     since the antigen present     may be below the detection limit     of the test.     (NOTE)     PERFORMED AT [facility]     Performed at [facility]  MRSA PCR SCREENING     Status: None   Collection Time    07/01/13  1:58 AM      Result  Value Ref Range   MRSA by PCR NEGATIVE  NEGATIVE   Comment:            The GeneXpert MRSA Assay (FDA     approved for NASAL specimens     only), is one component of a     comprehensive MRSA colonization     surveillance program. It is not     intended to diagnose MRSA     infection nor to guide or     monitor treatment for     MRSA infections.  CBC WITH DIFFERENTIAL     Status: Abnormal   Collection Time    07/02/13  5:15 AM      Result Value Ref Range   WBC 5.2  4.0 - 10.5 K/uL   RBC 3.55 (*) 3.87 - 5.11 MIL/uL   Hemoglobin 10.0 (*) 12.0 - 15.0 g/dL   Comment: DELTA CHECK NOTED     REPEATED TO VERIFY   HCT 29.5 (*) 36.0 - 46.0 %   MCV 83.1  78.0 - 100.0 fL  MCH 28.2  26.0 - 34.0 pg   MCHC 33.9  30.0 - 36.0 g/dL   RDW 13.2  11.5 - 15.5 %   Platelets 74 (*) 150 - 400 K/uL   Comment: DELTA CHECK NOTED     REPEATED TO VERIFY     SPECIMEN CHECKED FOR CLOTS     PLATELET COUNT CONFIRMED BY SMEAR   Neutrophils Relative % 73  43 - 77 %   Neutro Abs 3.8  1.7 - 7.7 K/uL   Lymphocytes Relative 21  12 - 46 %   Lymphs Abs 1.1  0.7 - 4.0 K/uL   Monocytes Relative 6  3 - 12 %   Monocytes Absolute 0.3  0.1 - 1.0 K/uL   Eosinophils Relative 0  0 - 5 %   Eosinophils Absolute 0.0  0.0 - 0.7 K/uL   Basophils Relative 0  0 - 1 %   Basophils Absolute 0.0  0.0 - 0.1 K/uL  COMPREHENSIVE METABOLIC PANEL     Status: Abnormal   Collection Time    07/02/13  5:15 AM      Result Value Ref Range   Sodium 144  137 - 147 mEq/L   Potassium 3.5 (*) 3.7 - 5.3 mEq/L   Comment: RESULT REPEATED AND VERIFIED     DELTA CHECK NOTED   Chloride 110  96 - 112 mEq/L   Comment: RESULT REPEATED AND VERIFIED     DELTA CHECK NOTED   CO2 25  19 - 32 mEq/L   Glucose, Bld 94  70 - 99 mg/dL   BUN 8  6 - 23 mg/dL   Creatinine, Ser 0.66  0.50 - 1.10 mg/dL   Calcium 7.9 (*) 8.4 - 10.5 mg/dL   Total Protein 5.4 (*) 6.0 - 8.3 g/dL   Albumin 2.5 (*) 3.5 - 5.2 g/dL   AST 14  0 - 37 U/L   ALT 15  0 - 35 U/L    Alkaline Phosphatase 51  39 - 117 U/L   Total Bilirubin 0.3  0.3 - 1.2 mg/dL   GFR calc non Af Amer >90  >90 mL/min   GFR calc Af Amer >90  >90 mL/min   Comment: (NOTE)     The eGFR has been calculated using the CKD EPI equation.     This calculation has not been validated in all clinical situations.     eGFR's persistently <90 mL/min signify possible Chronic Kidney     Disease.  HEPARIN INDUCED THROMBOCYTOPENIA PNL     Status: None   Collection Time    07/02/13  8:45 AM      Result Value Ref Range   Heparin Induced Plt Ab Negative  Negative   Comment: (NOTE)     This test is approximately 95% sensitive for     detecting antibodies to the Platelet Factor 4/Heparin     Complex (PF4/Heparin). Although the result of     this patient's sample is negative, a small     percentage of patients may still be positive     by the Serotonin Release Assay (SRA), possibly     due to other target antigens involved in this     reaction such as Interleukin-8 or Neutrophil-     Activating Peptide-2 (NAP-2). A clinical risk     assessment score may provide additional guidance     in deciding whether additional testing is of     value (J Thromb Haemost 2008;6:1304-12).   Patient O.D.  0.124     Comment: (NOTE)     ______________________________________     !   O.D.units        !    Result       !     !____________________!_________________!     ! <=0.300            !   Negative      !     !  >0.300 to <=0.500 !  Weak Positive  !     !  >0.500            !   Positive      !     !____________________!_________________!     For more information on this test, go to:     http://education.[facility].com/faq/FAQ06V1   UFH SRA Result Negative  Negative   Comment: (NOTE)     A sample is considered negative if there is     <20% release.     The SRA has a sensitivity (88-100%) and specificity     (89-100%) for Heparin Induced Thrombocytopenia (HIT)     Rondel Baton, Hematology Am Soc Hematol Educ      Program. 2009; 225-232).   UFH Low Dose 0.1 IU/mL 0     UFH Low Dose 0.5 IU/mL 0     UFH High Dose UFH H 0     Comment: (NOTE)     This test was developed and its performance     characteristics have been determined by [employer], [place], [place].     It has not been cleared or approved by the U.[place].     Food and Drug Administration. The FDA has determined     that such clearance or approval is not necessary.     Performance characteristics refer to the analytical     performance of the test.     For more information on this test, go to:     http://education.[facility].com/faq/FAQ06V1     Performed at [facility]  CBC     Status: Abnormal   Collection Time    07/03/13  5:25 AM      Result Value Ref Range   WBC 3.9 (*) 4.0 - 10.5 K/uL   RBC 3.66 (*) 3.87 - 5.11 MIL/uL   Hemoglobin 10.3 (*) 12.0 - 15.0 g/dL   HCT 29.9 (*) 36.0 - 46.0 %   MCV 81.7  78.0 - 100.0 fL   MCH 28.1  26.0 - 34.0 pg   MCHC 34.4  30.0 - 36.0 g/dL   RDW 12.9  11.5 - 15.5 %   Platelets 93 (*) 150 - 400 K/uL   Comment: CONSISTENT WITH PREVIOUS RESULT  COMPREHENSIVE METABOLIC PANEL     Status: Abnormal   Collection Time    07/03/13  5:25 AM      Result Value Ref Range   Sodium 142  137 - 147 mEq/L   Potassium 3.1 (*) 3.7 - 5.3 mEq/L   Chloride 107  96 - 112 mEq/L   CO2 26  19 - 32 mEq/L   Glucose, Bld 93  70 - 99 mg/dL   BUN 8  6 - 23 mg/dL   Creatinine, Ser 0.69  0.50 - 1.10 mg/dL   Calcium 8.2 (*) 8.4 - 10.5 mg/dL   Total Protein 5.8 (*) 6.0 - 8.3 g/dL   Albumin 2.8 (*) 3.5 - 5.2 g/dL   AST 12  0 - 37  U/L   ALT 12  0 - 35 U/L   Alkaline Phosphatase 62  39 - 117 U/L   Total Bilirubin 0.3  0.3 - 1.2 mg/dL   GFR calc non Af Amer >90  >90 mL/min   GFR calc Af Amer >90  >90 mL/min   Comment: (NOTE)     The eGFR has been calculated using the CKD EPI equation.     This calculation has not been validated in all clinical situations.     eGFR's persistently <90  mL/min signify possible Chronic Kidney     Disease.  COMPREHENSIVE METABOLIC PANEL     Status: Abnormal   Collection Time    07/04/13  4:25 AM      Result Value Ref Range   Sodium 145  137 - 147 mEq/L   Potassium 3.3 (*) 3.7 - 5.3 mEq/L   Chloride 110  96 - 112 mEq/L   CO2 24  19 - 32 mEq/L   Glucose, Bld 95  70 - 99 mg/dL   BUN 12  6 - 23 mg/dL   Creatinine, Ser 0.88  0.50 - 1.10 mg/dL   Calcium 8.5  8.4 - 10.5 mg/dL   Total Protein 6.2  6.0 - 8.3 g/dL   Albumin 3.0 (*) 3.5 - 5.2 g/dL   AST 12  0 - 37 U/L   ALT 13  0 - 35 U/L   Alkaline Phosphatase 60  39 - 117 U/L   Total Bilirubin 0.3  0.3 - 1.2 mg/dL   GFR calc non Af Amer 77 (*) >90 mL/min   GFR calc Af Amer 89 (*) >90 mL/min   Comment: (NOTE)     The eGFR has been calculated using the CKD EPI equation.     This calculation has not been validated in all clinical situations.     eGFR's persistently <90 mL/min signify possible Chronic Kidney     Disease.  CBC     Status: Abnormal   Collection Time    07/04/13  4:25 AM      Result Value Ref Range   WBC 4.3  4.0 - 10.5 K/uL   RBC 3.87  3.87 - 5.11 MIL/uL   Hemoglobin 10.9 (*) 12.0 - 15.0 g/dL   HCT 31.0 (*) 36.0 - 46.0 %   MCV 80.1  78.0 - 100.0 fL   MCH 28.2  26.0 - 34.0 pg   MCHC 35.2  30.0 - 36.0 g/dL   RDW 12.8  11.5 - 15.5 %   Platelets 126 (*) 150 - 400 K/uL   Comment: DELTA CHECK NOTED     REPEATED TO VERIFY  MEASLES/MUMPS/RUBELLA IMMUNITY     Status: Abnormal   Collection Time    08/10/13 10:25 AM      Result Value Ref Range   Rubella 1.98 (*) <0.90 Index   Comment:       Reference Range:        <0.90 Index = Not Immune                         0.90-0.99 Index = Equivocal                            >=1.00 Index = Immune         Mumps IgG 20.70 (*) <9.00 AU/mL   Comment:       Reference Range:         <  9.00 AU/mL = Negative                         9.00-10.99 AU/mL = Equivocal                            >=11.00 AU/mL = Positive         Rubeola IgG  >300.00 (*) <25.00 AU/mL   Comment:       Reference Range:        <25.00 AU/mL = Negative                        25.00-29.99 AU/mL = Equivocal                            >=30.00 AU/mL = Positive        VARICELLA ZOSTER ANTIBODY, IGG     Status: Abnormal   Collection Time    08/10/13 10:25 AM      Result Value Ref Range   Varicella IgG 897.50 (*) <135.00 Index   Comment:       Reference Range:       <135.00 Index = Negative                      135.00-164.99 Index = Equivocal                           >=165.00 Index = Positive        HEPATITIS B SURFACE ANTIBODY     Status: Abnormal   Collection Time    08/10/13 10:25 AM      Result Value Ref Range   Hep B S Ab POS (*) NEGATIVE  CBC WITH DIFFERENTIAL     Status: Abnormal   Collection Time    08/10/13 10:25 AM      Result Value Ref Range   WBC 3.7 (*) 4.0 - 10.5 K/uL   RBC 4.56  3.87 - 5.11 MIL/uL   Hemoglobin 12.7  12.0 - 15.0 g/dL   HCT 37.2  36.0 - 46.0 %   MCV 81.6  78.0 - 100.0 fL   MCH 27.9  26.0 - 34.0 pg   MCHC 34.1  30.0 - 36.0 g/dL   RDW 15.1  11.5 - 15.5 %   Platelets 190  150 - 400 K/uL   Neutrophils Relative % 70  43 - 77 %   Neutro Abs 2.6  1.7 - 7.7 K/uL   Lymphocytes Relative 25  12 - 46 %   Lymphs Abs 0.9  0.7 - 4.0 K/uL   Monocytes Relative 5  3 - 12 %   Monocytes Absolute 0.2  0.1 - 1.0 K/uL   Eosinophils Relative 0  0 - 5 %   Eosinophils Absolute 0.0  0.0 - 0.7 K/uL   Basophils Relative 0  0 - 1 %   Basophils Absolute 0.0  0.0 - 0.1 K/uL   Smear Review Criteria for review not met    COMPREHENSIVE METABOLIC PANEL     Status: Abnormal   Collection Time    08/10/13 10:25 AM      Result Value Ref Range   Sodium 140  135 - 145 mEq/L   Potassium 4.3  3.5 - 5.3 mEq/L   Chloride 102  96 - 112  mEq/L   CO2 29  19 - 32 mEq/L   Glucose, Bld 103 (*) 70 - 99 mg/dL   BUN 18  6 - 23 mg/dL   Creat 1.01  0.50 - 1.10 mg/dL   Total Bilirubin 0.4  0.2 - 1.2 mg/dL   Alkaline Phosphatase 61  39 - 117 U/L   AST 16   0 - 37 U/L   ALT 16  0 - 35 U/L   Total Protein 7.0  6.0 - 8.3 g/dL   Albumin 4.7  3.5 - 5.2 g/dL   Calcium 9.8  8.4 - 10.5 mg/dL  LIPASE     Status: None   Collection Time    08/10/13 10:25 AM      Result Value Ref Range   Lipase 18  0 - 75 U/L  QUANTIFERON TB GOLD ASSAY     Status: None   Collection Time    08/10/13 10:25 AM      Result Value Ref Range   Interferon Gamma Release Assay NEGATIVE  NEGATIVE   Comment:     TB Ag value 0.01     Quantiferon Nil Value 0.02     Mitogen value 1.78     Quantiferon Tb Ag Minus Nil Value 0.00     Comment:       A positive result is indicated by a TB Ag value minus Nil value     greater than or equal to 0.35 IU/mL and the TB Ag value minus Nil     value must be greater than or equal to 25% of the Nil value. There may     be insufficient information in these values to differentiate between     some negative and some indeterminate test values.           The QuantiFERON TB Gold assay is intended for use as an aid in the      diagnosis of TB infection. Negative results suggest that there is no     TB infection. In patients with high suspicion of exposure, a negative     test should be repeated. A positive test can support the diagnosis of     infection with Mycobacterium tuberculosis. Among individuals without     tuberculosis infection, a positive test may be due to exposure to Heard, M. szulgai or M. marinum.           The performance of the FDA approved QuantiFERON(R)-TB Gold test has     not been extensively evaluated with specimens from the following     groups of individuals:           A. Individuals younger than 47 years of age.     B. Pregnant women.     C. Individuals who have impaired or altered immune function such as        those who have HIV infection or AIDS, those who have         transplantation managed with immunosuppressive drugs (e.g.           corticosteroids, methotrexate, azathioprine, cancer chemotherapy),         and those who have other clinical conditions: diabetes, silicosis,        chronic renal failure, hematological disorders (e.g., leukemia and        lymphomas), and other specific malignancies (e.g., carcinoma of         the head and neck or lung).  HEPATITIS B SURFACE ANTIGEN     Status: None   Collection Time    08/10/13 10:25 AM      Result Value Ref Range   Hepatitis B Surface Ag NEGATIVE  NEGATIVE    Assessment/Plan: UTI symptoms Rx Cipro.  Increase fluids.  Probiotic and Cranberry supplement.  Will send urine for micro and culture.

## 2013-09-08 NOTE — Progress Notes (Signed)
Pre visit review using our clinic review tool, if applicable. No additional management support is needed unless otherwise documented below in the visit note/SLS  

## 2013-09-08 NOTE — Patient Instructions (Signed)
Please take antibiotic as directed.  Increase fluids intake.  Continue Phenergan if needed.  Take a daily cranberry supplement and probiotic.  I will call you with your urine culture results.  Please check with Dr. Ardis Hughs' office for an appointment for your chronic nausea.  Let me know if I need to place a new referral.

## 2013-09-09 LAB — URINALYSIS, MICROSCOPIC ONLY
Bacteria, UA: NONE SEEN
Casts: NONE SEEN
Crystals: NONE SEEN

## 2013-09-11 LAB — CULTURE, URINE COMPREHENSIVE

## 2013-09-13 ENCOUNTER — Encounter: Payer: Self-pay | Admitting: Gastroenterology

## 2013-09-14 ENCOUNTER — Encounter: Payer: Self-pay | Admitting: Physician Assistant

## 2013-09-14 ENCOUNTER — Ambulatory Visit (INDEPENDENT_AMBULATORY_CARE_PROVIDER_SITE_OTHER): Payer: Medicare HMO | Admitting: Physician Assistant

## 2013-09-14 VITALS — BP 102/80 | HR 114 | Temp 97.9°F | Resp 16 | Ht 66.0 in | Wt 204.0 lb

## 2013-09-14 DIAGNOSIS — R3989 Other symptoms and signs involving the genitourinary system: Secondary | ICD-10-CM

## 2013-09-14 DIAGNOSIS — M545 Low back pain, unspecified: Secondary | ICD-10-CM

## 2013-09-14 DIAGNOSIS — N3000 Acute cystitis without hematuria: Secondary | ICD-10-CM | POA: Insufficient documentation

## 2013-09-14 DIAGNOSIS — R399 Unspecified symptoms and signs involving the genitourinary system: Secondary | ICD-10-CM

## 2013-09-14 DIAGNOSIS — R110 Nausea: Secondary | ICD-10-CM

## 2013-09-14 LAB — POCT URINALYSIS DIPSTICK
Bilirubin, UA: NEGATIVE
Glucose, UA: NEGATIVE
Ketones, UA: NEGATIVE
Nitrite, UA: NEGATIVE
PH UA: 7
RBC UA: NEGATIVE
SPEC GRAV UA: 1.015
UROBILINOGEN UA: 0.2

## 2013-09-14 MED ORDER — PROMETHAZINE HCL 25 MG PO TABS: 25.0000 mg | ORAL_TABLET | Freq: Four times a day (QID) | ORAL | Status: DC | PRN

## 2013-09-14 MED ORDER — TETRACYCLINE HCL 500 MG PO CAPS: 500.0000 mg | ORAL_CAPSULE | Freq: Two times a day (BID) | ORAL | Status: DC

## 2013-09-14 NOTE — Addendum Note (Signed)
Addended by: Rockwell Germany on: 09/14/2013 05:17 PM   Modules accepted: Orders

## 2013-09-14 NOTE — Progress Notes (Signed)
Pre visit review using our clinic review tool, if applicable. No additional management support is needed unless otherwise documented below in the visit note/SLS  

## 2013-09-14 NOTE — Assessment & Plan Note (Signed)
Will treat with Tetracycline due to sensitivities. Continue good fluid intake, daily probiotic and cranberry supplement.

## 2013-09-14 NOTE — Assessment & Plan Note (Signed)
Possibly exacerbated by UTI.  There is some muscular tenderness noted on exam of right lumbar perispinous musculature.  Continue Rx pain medication.  Topical Icy Hot.  Heating pad. Avoid heavy lifting or overexertion. Call or RTC if no symptom improvement.

## 2013-09-14 NOTE — Progress Notes (Signed)
Patient presents to clinic today c/o right sided low back pain radiating into her groin that has been present for 2 days.  Pain is constant and throbbing.  Does not radiate elsewhere.  Denies injury or trauma.  Denies numbness, tingling or weakness of RLE. Patient was seen last week for UTI.  Was treated with Cipro.  Culture grew two different organisms, one of which was resistant to Cipro.  Patient denies dysuria or urinary urgency.  Does endorse frequency and incomplete bladder emptying.  Denies flank pain, emesis, fever or chills.  Past Medical History  Diagnosis Date  . Dysrhythmia     Left sided heart diease  . Mental disorder   . Bipolar affective disorder, mixed, in partial or remission   . Headache(784.0)   . Anxiety   . Depression   . 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 History of gallstones   . PONV (postoperative nausea and vomiting)   . Sleep apnea   . Renal disorder   . Kidney stones   . Pneumonia     Hx: of as a child  . Migraine     Current Outpatient Prescriptions on File Prior to Visit  Medication Sig Dispense Refill  . hydrOXYzine (VISTARIL) 25 MG capsule Take 25 mg by mouth at bedtime.      Marland Kitchen lithium carbonate (LITHOBID) 300 MG CR tablet Take 300 mg by mouth every evening.       . potassium chloride SA (K-DUR,KLOR-CON) 20 MEQ tablet Take 2 tablets (40 mEq total) by mouth daily.  20 tablet  0  . pregabalin (LYRICA) 100 MG capsule Take 1 capsule (100 mg total) by mouth 2 (two) times daily.  60 capsule  0  . QUEtiapine (SEROQUEL) 400 MG tablet Take 800 mg by mouth at bedtime.       . SUMAtriptan (IMITREX) 25 MG tablet TAKE 1 TABLET BY MOUTH ONCE. MAY REPEAT IN 2 HOURS IF HEADACHE PERSISTS OR RECURS.  10 tablet  0  . Tapentadol HCl (NUCYNTA) 75 MG TABS Take 1 tablet by mouth as directed. Q4-6 HOURS, MAX 5 TABS DAILY      . traMADol (ULTRAM) 50 MG tablet Take 1 tablet (50 mg total) by mouth every 6 (six) hours as needed for moderate pain.  30  tablet  0  . traZODone (DESYREL) 100 MG tablet Take 400 mg by mouth at bedtime.      Marland Kitchen zolpidem (AMBIEN) 10 MG tablet Take 10 mg by mouth at bedtime as needed for sleep. For sleep       No current facility-administered medications on file prior to visit.    No Known Allergies  Family History  Problem Relation Age of Onset  . Colon cancer Father 44  . Stomach cancer Neg Hx   . Rectal cancer Neg Hx   . Esophageal cancer Neg Hx   . Breast cancer Mother   . Breast cancer Father   . Uterine cancer Mother     History   Social History  . Marital Status: Married    Spouse Name: N/A    Number of Children: 6  . Years of Education: N/A   Occupational History  . nursing student    Social History Main Topics  . Smoking status: Never Smoker   . Smokeless tobacco: Never Used  . Alcohol Use: No  . Drug Use: No     Comment: see triage note  . Sexual Activity: No  Other Topics Concern  . 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  All other ROS are negative.  BP 102/80  Pulse 114  Temp(Src) 97.9 F (36.6 C) (Oral)  Resp 16  Ht 5' 6"  (1.676 m)  Wt 204 lb (92.534 kg)  BMI 32.94 kg/m2  SpO2 98%  Physical Exam  Constitutional: She is oriented to person, place, and time and well-developed, well-nourished, and in no distress.  HENT:  Head: Normocephalic and atraumatic.  Eyes: Conjunctivae are normal. Pupils are equal, round, and reactive to light.  Neck: Neck supple.  Cardiovascular: Regular rhythm, normal heart sounds and intact distal pulses.   Mild tachycardia.  Pulmonary/Chest: Effort normal and breath sounds normal. No respiratory distress. She has no wheezes. She has no rales. She  exhibits no tenderness.  Abdominal: Soft. Bowel sounds are normal. She exhibits no distension and no mass. There is no tenderness. There is no rebound and no guarding.  Negative CVA tenderness.  Musculoskeletal:       Right hip: Normal. She exhibits normal range of motion, normal strength and no tenderness.       Lumbar back: She exhibits tenderness. She exhibits no bony tenderness, no swelling and no pain.       Right upper leg: Normal.  Neurological: She is alert and oriented to person, place, and time.  Skin: Skin is warm and dry. No rash noted.  Psychiatric: Affect normal.   Recent Results (from the past 2160 hour(s))  CBC WITH DIFFERENTIAL     Status: Abnormal   Collection Time    06/30/13  8:55 PM      Result Value Ref Range   WBC 9.1  4.0 - 10.5 K/uL   Comment: WHITE COUNT CONFIRMED ON SMEAR   RBC 4.98  3.87 - 5.11 MIL/uL   Hemoglobin 14.1  12.0 - 15.0 g/dL   HCT 41.7  36.0 - 46.0 %   MCV 83.7  78.0 - 100.0 fL   MCH 28.3  26.0 - 34.0 pg   MCHC 33.8  30.0 - 36.0 g/dL   RDW 13.3  11.5 - 15.5 %   Platelets 122 (*) 150 - 400 K/uL   Neutrophils Relative % 89 (*) 43 - 77 %   Lymphocytes Relative 5 (*) 12 - 46 %   Monocytes Relative 6  3 - 12 %   Eosinophils Relative 0  0 - 5 %   Basophils Relative 0  0 - 1 %   Neutro Abs 8.1 (*) 1.7 - 7.7 K/uL   Lymphs Abs 0.5 (*) 0.7 - 4.0 K/uL   Monocytes Absolute 0.5  0.1 - 1.0 K/uL   Eosinophils Absolute 0.0  0.0 - 0.7 K/uL   Basophils Absolute 0.0  0.0 - 0.1 K/uL   WBC Morphology INCREASED BANDS (>20% BANDS)     Smear Review LARGE PLATELETS PRESENT    COMPREHENSIVE METABOLIC PANEL     Status: Abnormal   Collection Time    06/30/13  8:55 PM      Result Value Ref Range   Sodium 139  137 -  147 mEq/L   Potassium 4.3  3.7 - 5.3 mEq/L   Comment: SLIGHT HEMOLYSIS     HEMOLYSIS AT THIS LEVEL MAY AFFECT RESULT   Chloride 100  96 - 112 mEq/L   CO2 26  19 - 32 mEq/L   Glucose, Bld 158 (*) 70 - 99 mg/dL   BUN 14  6 - 23 mg/dL   Creatinine,  Ser 0.88  0.50 - 1.10 mg/dL   Calcium 9.7  8.4 - 10.5 mg/dL   Total Protein 7.8  6.0 - 8.3 g/dL   Albumin 4.0  3.5 - 5.2 g/dL   AST 26  0 - 37 U/L   Comment: SLIGHT HEMOLYSIS     HEMOLYSIS AT THIS LEVEL MAY AFFECT RESULT   ALT 18  0 - 35 U/L   Comment: SLIGHT HEMOLYSIS     HEMOLYSIS AT THIS LEVEL MAY AFFECT RESULT   Alkaline Phosphatase 71  39 - 117 U/L   Total Bilirubin 0.5  0.3 - 1.2 mg/dL   GFR calc non Af Amer 77 (*) >90 mL/min   GFR calc Af Amer 89 (*) >90 mL/min   Comment: (NOTE)     The eGFR has been calculated using the CKD EPI equation.     This calculation has not been validated in all clinical situations.     eGFR's persistently <90 mL/min signify possible Chronic Kidney     Disease.  CULTURE, BLOOD (ROUTINE X 2)     Status: None   Collection Time    06/30/13  8:56 PM      Result Value Ref Range   Specimen Description BLOOD CENTRAL LINE     Special Requests BOTTLES DRAWN AEROBIC ONLY 4CC     Culture  Setup Time       Value: 07/01/2013 01:20     Performed at [facility]   Culture       Value: STAPHYLOCOCCUS SPECIES (COAGULASE NEGATIVE)     Note: RIFAMPIN AND GENTAMICIN SHOULD NOT BE USED AS SINGLE DRUGS FOR TREATMENT OF STAPH INFECTIONS.     17 Note: Gram Stain Report Called to,Read Back By and Verified With: MICHELLE REVES RN [id number] EDMOJ     Performed at [facility]   Report Status 07/03/2013 FINAL     Organism ID, Bacteria STAPHYLOCOCCUS SPECIES (COAGULASE NEGATIVE)    I-STAT CG4 LACTIC ACID, ED     Status: None   Collection Time    06/30/13  9:00 PM      Result Value Ref Range   Lactic Acid, Venous 0.79  0.5 - 2.2 mmol/L  CULTURE, BLOOD (ROUTINE X 2)     Status: None   Collection Time    06/30/13  9:45 PM      Result Value Ref Range   Specimen Description BLOOD BLOOD RIGHT WRIST     Special Requests BOTTLES DRAWN AEROBIC AND ANAEROBIC 5CC     Culture  Setup Time       Value: 07/01/2013 01:20     Performed at [facility]    Culture       Value: STAPHYLOCOCCUS SPECIES (COAGULASE NEGATIVE)     Note: SUSCEPTIBILITIES PERFORMED ON PREVIOUS CULTURE WITHIN THE LAST 5 DAYS.     0335 Note: Gram Stain Report Called to,Read Back By and Verified With: MICHELLE REVES 07/02/13 [facility]     Performed at [facility]   Report Status 07/03/2013 FINAL    URINALYSIS, ROUTINE W REFLEX MICROSCOPIC  Status: Abnormal   Collection Time    06/30/13 10:38 PM      Result Value Ref Range   Color, Urine AMBER (*) YELLOW   Comment: BIOCHEMICALS MAY BE AFFECTED BY COLOR   APPearance TURBID (*) CLEAR   Specific Gravity, Urine 1.027  1.005 - 1.030   pH 5.0  5.0 - 8.0   Glucose, UA NEGATIVE  NEGATIVE mg/dL   Hgb urine dipstick NEGATIVE  NEGATIVE   Bilirubin Urine SMALL (*) NEGATIVE   Ketones, ur NEGATIVE  NEGATIVE mg/dL   Protein, ur 30 (*) NEGATIVE mg/dL   Urobilinogen, UA 0.2  0.0 - 1.0 mg/dL   Nitrite NEGATIVE  NEGATIVE   Leukocytes, UA NEGATIVE  NEGATIVE  URINE CULTURE     Status: None   Collection Time    06/30/13 10:38 PM      Result Value Ref Range   Specimen Description URINE, CATHETERIZED     Special Requests NONE     Culture  Setup Time       Value: 07/01/2013 01:25     Performed at [place]       Value: NO GROWTH     Performed at [facility]   Culture       Value: NO GROWTH     Performed at [facility]   Report Status 07/02/2013 FINAL    URINE MICROSCOPIC-ADD ON     Status: Abnormal   Collection Time    06/30/13 10:38 PM      Result Value Ref Range   Squamous Epithelial / LPF RARE  RARE   WBC, UA 0-2  <3 WBC/hpf   RBC / HPF 0-2  <3 RBC/hpf   Bacteria, UA MANY (*) RARE   Casts HYALINE CASTS (*) NEGATIVE   Urine-Other MUCOUS PRESENT     Comment: AMORPHOUS URATES/PHOSPHATES  LEGIONELLA ANTIGEN, URINE     Status: None   Collection Time    07/01/13 12:45 AM      Result Value Ref Range   Specimen Description URINE, CATHETERIZED     Special Requests NONE      Legionella Antigen, Urine       Value: Negative for Legionella pneumophilia serogroup 1     Performed at [facility]   Report Status 07/02/2013 FINAL    STREP PNEUMONIAE URINARY ANTIGEN     Status: None   Collection Time    07/01/13 12:45 AM      Result Value Ref Range   Strep Pneumo Urinary Antigen NEGATIVE  NEGATIVE   Comment:            Infection due to S. pneumoniae     cannot be absolutely ruled out     since the antigen present     may be below the detection limit     of the test.     (NOTE)     PERFORMED AT [facility]     Performed at [facility]  MRSA PCR SCREENING     Status: None   Collection Time    07/01/13  1:58 AM      Result Value Ref Range   MRSA by PCR NEGATIVE  NEGATIVE   Comment:            The GeneXpert MRSA Assay (FDA     approved for NASAL specimens     only), is one component of a     comprehensive MRSA colonization  surveillance program. It is not     intended to diagnose MRSA     infection nor to guide or     monitor treatment for     MRSA infections.  CBC WITH DIFFERENTIAL     Status: Abnormal   Collection Time    07/02/13  5:15 AM      Result Value Ref Range   WBC 5.2  4.0 - 10.5 K/uL   RBC 3.55 (*) 3.87 - 5.11 MIL/uL   Hemoglobin 10.0 (*) 12.0 - 15.0 g/dL   Comment: DELTA CHECK NOTED     REPEATED TO VERIFY   HCT 29.5 (*) 36.0 - 46.0 %   MCV 83.1  78.0 - 100.0 fL   MCH 28.2  26.0 - 34.0 pg   MCHC 33.9  30.0 - 36.0 g/dL   RDW 13.2  11.5 - 15.5 %   Platelets 74 (*) 150 - 400 K/uL   Comment: DELTA CHECK NOTED     REPEATED TO VERIFY     SPECIMEN CHECKED FOR CLOTS     PLATELET COUNT CONFIRMED BY SMEAR   Neutrophils Relative % 73  43 - 77 %   Neutro Abs 3.8  1.7 - 7.7 K/uL   Lymphocytes Relative 21  12 - 46 %   Lymphs Abs 1.1  0.7 - 4.0 K/uL   Monocytes Relative 6  3 - 12 %   Monocytes Absolute 0.3  0.1 - 1.0 K/uL   Eosinophils Relative 0  0 - 5 %   Eosinophils Absolute 0.0  0.0 - 0.7 K/uL   Basophils Relative 0  0 - 1 %    Basophils Absolute 0.0  0.0 - 0.1 K/uL  COMPREHENSIVE METABOLIC PANEL     Status: Abnormal   Collection Time    07/02/13  5:15 AM      Result Value Ref Range   Sodium 144  137 - 147 mEq/L   Potassium 3.5 (*) 3.7 - 5.3 mEq/L   Comment: RESULT REPEATED AND VERIFIED     DELTA CHECK NOTED   Chloride 110  96 - 112 mEq/L   Comment: RESULT REPEATED AND VERIFIED     DELTA CHECK NOTED   CO2 25  19 - 32 mEq/L   Glucose, Bld 94  70 - 99 mg/dL   BUN 8  6 - 23 mg/dL   Creatinine, Ser 0.66  0.50 - 1.10 mg/dL   Calcium 7.9 (*) 8.4 - 10.5 mg/dL   Total Protein 5.4 (*) 6.0 - 8.3 g/dL   Albumin 2.5 (*) 3.5 - 5.2 g/dL   AST 14  0 - 37 U/L   ALT 15  0 - 35 U/L   Alkaline Phosphatase 51  39 - 117 U/L   Total Bilirubin 0.3  0.3 - 1.2 mg/dL   GFR calc non Af Amer >90  >90 mL/min   GFR calc Af Amer >90  >90 mL/min   Comment: (NOTE)     The eGFR has been calculated using the CKD EPI equation.     This calculation has not been validated in all clinical situations.     eGFR's persistently <90 mL/min signify possible Chronic Kidney     Disease.  HEPARIN INDUCED THROMBOCYTOPENIA PNL     Status: None   Collection Time    07/02/13  8:45 AM      Result Value Ref Range   Heparin Induced Plt Ab Negative  Negative   Comment: (NOTE)     This test  is approximately 95% sensitive for     detecting antibodies to the Platelet Factor 4/Heparin     Complex (PF4/Heparin). Although the result of     this patient's sample is negative, a small     percentage of patients may still be positive     by the Serotonin Release Assay (SRA), possibly     due to other target antigens involved in this     reaction such as Interleukin-8 or Neutrophil-     Activating Peptide-2 (NAP-2). A clinical risk     assessment score may provide additional guidance     in deciding whether additional testing is of     value (J Thromb Haemost 2008;6:1304-12).   Patient O.D. 0.124     Comment: (NOTE)      ______________________________________     !   O.D.units        !    Result       !     !____________________!_________________!     ! <=0.300            !   Negative      !     !  >0.300 to <=0.500 !  Weak Positive  !     !  >0.500            !   Positive      !     !____________________!_________________!     For more information on this test, go to:     http://education.[facility].com/faq/FAQ06V1   UFH SRA Result Negative  Negative   Comment: (NOTE)     A sample is considered negative if there is     <20% release.     The SRA has a sensitivity (88-100%) and specificity     (89-100%) for Heparin Induced Thrombocytopenia (HIT)     Rondel Baton, Hematology Am Soc Hematol Educ     Program. 2009; 225-232).   UFH Low Dose 0.1 IU/mL 0     UFH Low Dose 0.5 IU/mL 0     UFH High Dose UFH H 0     Comment: (NOTE)     This test was developed and its performance     characteristics have been determined by [employer], [place], [place].     It has not been cleared or approved by the U.S.     Food and Drug Administration. The FDA has determined     that such clearance or approval is not necessary.     Performance characteristics refer to the analytical     performance of the test.     For more information on this test, go to:     http://education.[facility].com/faq/FAQ06V1     Performed at [facility]  CBC     Status: Abnormal   Collection Time    07/03/13  5:25 AM      Result Value Ref Range   WBC 3.9 (*) 4.0 - 10.5 K/uL   RBC 3.66 (*) 3.87 - 5.11 MIL/uL   Hemoglobin 10.3 (*) 12.0 - 15.0 g/dL   HCT 29.9 (*) 36.0 - 46.0 %   MCV 81.7  78.0 - 100.0 fL   MCH 28.1  26.0 - 34.0 pg   MCHC 34.4  30.0 - 36.0 g/dL   RDW 12.9  11.5 - 15.5 %   Platelets 93 (*) 150 - 400 K/uL   Comment: CONSISTENT WITH PREVIOUS RESULT  COMPREHENSIVE METABOLIC PANEL  Status: Abnormal   Collection Time    07/03/13  5:25 AM      Result Value Ref Range   Sodium  142  137 - 147 mEq/L   Potassium 3.1 (*) 3.7 - 5.3 mEq/L   Chloride 107  96 - 112 mEq/L   CO2 26  19 - 32 mEq/L   Glucose, Bld 93  70 - 99 mg/dL   BUN 8  6 - 23 mg/dL   Creatinine, Ser 0.69  0.50 - 1.10 mg/dL   Calcium 8.2 (*) 8.4 - 10.5 mg/dL   Total Protein 5.8 (*) 6.0 - 8.3 g/dL   Albumin 2.8 (*) 3.5 - 5.2 g/dL   AST 12  0 - 37 U/L   ALT 12  0 - 35 U/L   Alkaline Phosphatase 62  39 - 117 U/L   Total Bilirubin 0.3  0.3 - 1.2 mg/dL   GFR calc non Af Amer >90  >90 mL/min   GFR calc Af Amer >90  >90 mL/min   Comment: (NOTE)     The eGFR has been calculated using the CKD EPI equation.     This calculation has not been validated in all clinical situations.     eGFR's persistently <90 mL/min signify possible Chronic Kidney     Disease.  COMPREHENSIVE METABOLIC PANEL     Status: Abnormal   Collection Time    07/04/13  4:25 AM      Result Value Ref Range   Sodium 145  137 - 147 mEq/L   Potassium 3.3 (*) 3.7 - 5.3 mEq/L   Chloride 110  96 - 112 mEq/L   CO2 24  19 - 32 mEq/L   Glucose, Bld 95  70 - 99 mg/dL   BUN 12  6 - 23 mg/dL   Creatinine, Ser 0.88  0.50 - 1.10 mg/dL   Calcium 8.5  8.4 - 10.5 mg/dL   Total Protein 6.2  6.0 - 8.3 g/dL   Albumin 3.0 (*) 3.5 - 5.2 g/dL   AST 12  0 - 37 U/L   ALT 13  0 - 35 U/L   Alkaline Phosphatase 60  39 - 117 U/L   Total Bilirubin 0.3  0.3 - 1.2 mg/dL   GFR calc non Af Amer 77 (*) >90 mL/min   GFR calc Af Amer 89 (*) >90 mL/min   Comment: (NOTE)     The eGFR has been calculated using the CKD EPI equation.     This calculation has not been validated in all clinical situations.     eGFR's persistently <90 mL/min signify possible Chronic Kidney     Disease.  CBC     Status: Abnormal   Collection Time    07/04/13  4:25 AM      Result Value Ref Range   WBC 4.3  4.0 - 10.5 K/uL   RBC 3.87  3.87 - 5.11 MIL/uL   Hemoglobin 10.9 (*) 12.0 - 15.0 g/dL   HCT 31.0 (*) 36.0 - 46.0 %   MCV 80.1  78.0 - 100.0 fL   MCH 28.2  26.0 - 34.0 pg   MCHC  35.2  30.0 - 36.0 g/dL   RDW 12.8  11.5 - 15.5 %   Platelets 126 (*) 150 - 400 K/uL   Comment: DELTA CHECK NOTED     REPEATED TO VERIFY  MEASLES/MUMPS/RUBELLA IMMUNITY     Status: Abnormal   Collection Time    08/10/13 10:25 AM  Result Value Ref Range   Rubella 1.98 (*) <0.90 Index   Comment:       Reference Range:        <0.90 Index = Not Immune                         0.90-0.99 Index = Equivocal                            >=1.00 Index = Immune         Mumps IgG 20.70 (*) <9.00 AU/mL   Comment:       Reference Range:         <9.00 AU/mL = Negative                         9.00-10.99 AU/mL = Equivocal                            >=11.00 AU/mL = Positive         Rubeola IgG >300.00 (*) <25.00 AU/mL   Comment:       Reference Range:        <25.00 AU/mL = Negative                        25.00-29.99 AU/mL = Equivocal                            >=30.00 AU/mL = Positive        VARICELLA ZOSTER ANTIBODY, IGG     Status: Abnormal   Collection Time    08/10/13 10:25 AM      Result Value Ref Range   Varicella IgG 897.50 (*) <135.00 Index   Comment:       Reference Range:       <135.00 Index = Negative                      135.00-164.99 Index = Equivocal                           >=165.00 Index = Positive        HEPATITIS B SURFACE ANTIBODY     Status: Abnormal   Collection Time    08/10/13 10:25 AM      Result Value Ref Range   Hep B S Ab POS (*) NEGATIVE  CBC WITH DIFFERENTIAL     Status: Abnormal   Collection Time    08/10/13 10:25 AM      Result Value Ref Range   WBC 3.7 (*) 4.0 - 10.5 K/uL   RBC 4.56  3.87 - 5.11 MIL/uL   Hemoglobin 12.7  12.0 - 15.0 g/dL   HCT 37.2  36.0 - 46.0 %   MCV 81.6  78.0 - 100.0 fL   MCH 27.9  26.0 - 34.0 pg   MCHC 34.1  30.0 - 36.0 g/dL   RDW 15.1  11.5 - 15.5 %   Platelets 190  150 - 400 K/uL   Neutrophils Relative % 70  43 - 77 %   Neutro Abs 2.6  1.7 - 7.7 K/uL   Lymphocytes Relative 25  12 - 46 %   Lymphs Abs 0.9  0.7 - 4.0 K/uL  Monocytes Relative 5  3 - 12 %   Monocytes Absolute 0.2  0.1 - 1.0 K/uL   Eosinophils Relative 0  0 - 5 %   Eosinophils Absolute 0.0  0.0 - 0.7 K/uL   Basophils Relative 0  0 - 1 %   Basophils Absolute 0.0  0.0 - 0.1 K/uL   Smear Review Criteria for review not met    COMPREHENSIVE METABOLIC PANEL     Status: Abnormal   Collection Time    08/10/13 10:25 AM      Result Value Ref Range   Sodium 140  135 - 145 mEq/L   Potassium 4.3  3.5 - 5.3 mEq/L   Chloride 102  96 - 112 mEq/L   CO2 29  19 - 32 mEq/L   Glucose, Bld 103 (*) 70 - 99 mg/dL   BUN 18  6 - 23 mg/dL   Creat 1.01  0.50 - 1.10 mg/dL   Total Bilirubin 0.4  0.2 - 1.2 mg/dL   Alkaline Phosphatase 61  39 - 117 U/L   AST 16  0 - 37 U/L   ALT 16  0 - 35 U/L   Total Protein 7.0  6.0 - 8.3 g/dL   Albumin 4.7  3.5 - 5.2 g/dL   Calcium 9.8  8.4 - 10.5 mg/dL  LIPASE     Status: None   Collection Time    08/10/13 10:25 AM      Result Value Ref Range   Lipase 18  0 - 75 U/L  QUANTIFERON TB GOLD ASSAY     Status: None   Collection Time    08/10/13 10:25 AM      Result Value Ref Range   Interferon Gamma Release Assay NEGATIVE  NEGATIVE   Comment:     TB Ag value 0.01     Quantiferon Nil Value 0.02     Mitogen value 1.78     Quantiferon Tb Ag Minus Nil Value 0.00     Comment:       A positive result is indicated by a TB Ag value minus Nil value     greater than or equal to 0.35 IU/mL and the TB Ag value minus Nil     value must be greater than or equal to 25% of the Nil value. There may     be insufficient information in these values to differentiate between     some negative and some indeterminate test values.           The QuantiFERON TB Gold assay is intended for use as an aid in the      diagnosis of TB infection. Negative results suggest that there is no     TB infection. In patients with high suspicion of exposure, a negative     test should be repeated. A positive test can support the diagnosis of     infection with  Mycobacterium tuberculosis. Among individuals without     tuberculosis infection, a positive test may be due to exposure to [place], M. szulgai or M. marinum.           The performance of the FDA approved QuantiFERON(R)-TB Gold test has     not been extensively evaluated with specimens from the following     groups of individuals:           A. Individuals younger than 47 years of age.     B. Pregnant women.  C. Individuals who have impaired or altered immune function such as        those who have HIV infection or AIDS, those who have         transplantation managed with immunosuppressive drugs (e.g.           corticosteroids, methotrexate, azathioprine, cancer chemotherapy),        and those who have other clinical conditions: diabetes, silicosis,        chronic renal failure, hematological disorders (e.g., leukemia and        lymphomas), and other specific malignancies (e.g., carcinoma of         the head and neck or lung).              HEPATITIS B SURFACE ANTIGEN     Status: None   Collection Time    08/10/13 10:25 AM      Result Value Ref Range   Hepatitis B Surface Ag NEGATIVE  NEGATIVE  CULTURE, URINE COMPREHENSIVE     Status: None   Collection Time    09/08/13  3:10 PM      Result Value Ref Range   Culture       Value: KLEBSIELLA PNEUMONIAE     STAPHYLOCOCCUS SPECIES (COAGULASE NEGATIVE)   Colony Count >=100,000 COLONIES/ML     Organism ID, Bacteria KLEBSIELLA PNEUMONIAE     Organism ID, Bacteria STAPHYLOCOCCUS SPECIES (COAGULASE NEGATIVE)     Comment: Rifampin and Gentamicin should not be used as     single drugs for treatment of Staph infections.  URINALYSIS, MICROSCOPIC ONLY     Status: Abnormal   Collection Time    09/08/13  3:10 PM      Result Value Ref Range   Squamous Epithelial / LPF FEW  RARE   Crystals NONE SEEN  NONE SEEN   Casts NONE SEEN  NONE SEEN   WBC, UA 11-20 (*) <3 WBC/hpf   RBC / HPF 0-2  <3 RBC/hpf   Bacteria, UA NONE SEEN  RARE   POCT URINALYSIS DIPSTICK     Status: Abnormal   Collection Time    09/08/13  3:10 PM      Result Value Ref Range   Color, UA gold     Clarity, UA cloudy     Glucose, UA neg     Bilirubin, UA neg     Ketones, UA neg     Spec Grav, UA >=1.030     Blood, UA Trace     pH, UA 5.0     Protein, UA neg     Urobilinogen, UA 0.2     Nitrite, UA neg     Leukocytes, UA moderate (2+)      Assessment/Plan: Acute cystitis without hematuria Will treat with Tetracycline due to sensitivities. Continue good fluid intake, daily probiotic and cranberry supplement.  Right-sided low back pain without sciatica Possibly exacerbated by UTI.  There is some muscular tenderness noted on exam of right lumbar perispinous musculature.  Continue Rx pain medication.  Topical Icy Hot.  Heating pad. Avoid heavy lifting or overexertion. Call or RTC if no symptom improvement.

## 2013-09-14 NOTE — Patient Instructions (Signed)
Please take antibiotic as directed.  Stay well hydrated.  Continue probiotic and cranberry supplement.  Continue Pain medications.  Avoid heavy lifting or overexertion.  Apply heating pad to area.  Apply topical Aspercreme or Icy Hot.  Call or return to clinic if symptoms are not improving.

## 2013-09-15 LAB — CULTURE, URINE COMPREHENSIVE
Colony Count: NO GROWTH
Organism ID, Bacteria: NO GROWTH

## 2013-09-15 LAB — URINALYSIS, MICROSCOPIC ONLY
BACTERIA UA: NONE SEEN
Casts: NONE SEEN
Crystals: NONE SEEN

## 2013-09-15 LAB — URINALYSIS, ROUTINE W REFLEX MICROSCOPIC
Bilirubin Urine: NEGATIVE
Glucose, UA: NEGATIVE mg/dL
HGB URINE DIPSTICK: NEGATIVE
KETONES UR: NEGATIVE mg/dL
NITRITE: NEGATIVE
Protein, ur: NEGATIVE mg/dL
SPECIFIC GRAVITY, URINE: 1.008 (ref 1.005–1.030)
UROBILINOGEN UA: 0.2 mg/dL (ref 0.0–1.0)
pH: 7 (ref 5.0–8.0)

## 2013-10-14 ENCOUNTER — Ambulatory Visit (HOSPITAL_COMMUNITY): Payer: Medicare (Managed Care) | Admitting: Psychiatry

## 2013-10-16 ENCOUNTER — Emergency Department (HOSPITAL_BASED_OUTPATIENT_CLINIC_OR_DEPARTMENT_OTHER)
Admission: EM | Admit: 2013-10-16 | Discharge: 2013-10-16 | Disposition: A | Discharge status: Home / Self Care | Payer: Medicare HMO | Attending: Emergency Medicine | Admitting: Emergency Medicine

## 2013-10-16 ENCOUNTER — Encounter (HOSPITAL_BASED_OUTPATIENT_CLINIC_OR_DEPARTMENT_OTHER): Payer: Self-pay | Admitting: Emergency Medicine

## 2013-10-16 DIAGNOSIS — Z8679 Personal history of other diseases of the circulatory system: Secondary | ICD-10-CM | POA: Diagnosis not present

## 2013-10-16 DIAGNOSIS — G43909 Migraine, unspecified, not intractable, without status migrainosus: Secondary | ICD-10-CM | POA: Diagnosis not present

## 2013-10-16 DIAGNOSIS — F3177 Bipolar disorder, in partial remission, most recent episode mixed: Secondary | ICD-10-CM | POA: Insufficient documentation

## 2013-10-16 DIAGNOSIS — F489 Nonpsychotic mental disorder, unspecified: Secondary | ICD-10-CM | POA: Insufficient documentation

## 2013-10-16 DIAGNOSIS — Z87448 Personal history of other diseases of urinary system: Secondary | ICD-10-CM | POA: Diagnosis not present

## 2013-10-16 DIAGNOSIS — Z79899 Other long term (current) drug therapy: Secondary | ICD-10-CM | POA: Insufficient documentation

## 2013-10-16 DIAGNOSIS — Z792 Long term (current) use of antibiotics: Secondary | ICD-10-CM | POA: Insufficient documentation

## 2013-10-16 DIAGNOSIS — Z8701 Personal history of pneumonia (recurrent): Secondary | ICD-10-CM | POA: Diagnosis not present

## 2013-10-16 DIAGNOSIS — Z8719 Personal history of other diseases of the digestive system: Secondary | ICD-10-CM | POA: Diagnosis not present

## 2013-10-16 DIAGNOSIS — R51 Headache: Secondary | ICD-10-CM

## 2013-10-16 DIAGNOSIS — F411 Generalized anxiety disorder: Secondary | ICD-10-CM | POA: Diagnosis not present

## 2013-10-16 DIAGNOSIS — Z87442 Personal history of urinary calculi: Secondary | ICD-10-CM | POA: Diagnosis not present

## 2013-10-16 DIAGNOSIS — R519 Headache, unspecified: Secondary | ICD-10-CM

## 2013-10-16 MED ORDER — DEXAMETHASONE SODIUM PHOSPHATE 10 MG/ML IJ SOLN
10.0000 mg | Freq: Once | INTRAMUSCULAR | Status: AC
  Administered 2013-10-16: 10 mg via INTRAMUSCULAR

## 2013-10-16 MED ORDER — DIPHENHYDRAMINE HCL 25 MG PO CAPS
25.0000 mg | ORAL_CAPSULE | Freq: Once | ORAL | Status: AC
  Administered 2013-10-16: 25 mg via ORAL
  Filled 2013-10-16: qty 1

## 2013-10-16 MED ORDER — HYDROMORPHONE HCL PF 1 MG/ML IJ SOLN
1.0000 mg | Freq: Once | INTRAMUSCULAR | Status: AC
  Administered 2013-10-16: 1 mg via INTRAMUSCULAR

## 2013-10-16 MED ORDER — HYDROMORPHONE HCL PF 1 MG/ML IJ SOLN
INTRAMUSCULAR | Status: AC
  Administered 2013-10-16: 1 mg via INTRAMUSCULAR
  Filled 2013-10-16: qty 1

## 2013-10-16 MED ORDER — DEXAMETHASONE SODIUM PHOSPHATE 10 MG/ML IJ SOLN
INTRAMUSCULAR | Status: AC
  Filled 2013-10-16: qty 1

## 2013-10-16 MED ORDER — PROCHLORPERAZINE EDISYLATE 5 MG/ML IJ SOLN
10.0000 mg | Freq: Once | INTRAMUSCULAR | Status: DC
  Filled 2013-10-16: qty 2

## 2013-10-16 MED ORDER — METOCLOPRAMIDE HCL 5 MG/ML IJ SOLN
10.0000 mg | Freq: Once | INTRAMUSCULAR | Status: DC
  Filled 2013-10-16: qty 2

## 2013-10-16 MED ORDER — KETOROLAC TROMETHAMINE 30 MG/ML IJ SOLN
30.0000 mg | Freq: Once | INTRAMUSCULAR | Status: AC
  Administered 2013-10-16: 30 mg via INTRAMUSCULAR
  Filled 2013-10-16: qty 1

## 2013-10-16 MED ORDER — METOCLOPRAMIDE HCL 5 MG/ML IJ SOLN
10.0000 mg | Freq: Once | INTRAMUSCULAR | Status: AC
  Administered 2013-10-16: 10 mg via INTRAMUSCULAR

## 2013-10-16 NOTE — Discharge Instructions (Signed)

## 2013-10-16 NOTE — ED Notes (Signed)
Pt reports ha with dizziness and nausea.  Hx of migraines.

## 2013-10-16 NOTE — ED Provider Notes (Signed)
CSN: [id number]     Arrival date & time 10/16/13  1628 History  This chart was scribed for [name] Manifold, MD by Irene Pap, ED Scribe. This patient was seen in room MH10/MH10 and patient care was started at 5:24 PM.     Chief Complaint  Patient presents with  . Migraine   Patient is a 48 y.o. female presenting with migraines. The history is provided by the patient. No language interpreter was used.  Migraine Associated symptoms include headaches.   HPI Comments: [name] is a 48 y.o. female with a history of migraines who presents to the Emergency Department complaining of a throbbing migraine onset earlier today. She reports associated nausea, vomiting, dizziness, and photophobia. She reports that she takes Zofran for the pain, but states that Phenergan works better for her migraines. She denies numbness, weakness, or fever. She denies any recent injury or trauma to the head. She denies changes in activity.   Past Medical History  Diagnosis Date  . Dysrhythmia     Left sided heart diease  . Mental disorder   . Bipolar affective disorder, mixed, in partial or remission   . Headache(784.0)   . Anxiety   . Depression   . 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 History of gallstones   . PONV (postoperative nausea and vomiting)   . Sleep apnea   . Renal disorder   . Kidney stones   . Pneumonia     Hx: of as a child  . Migraine    Past Surgical History  Procedure Laterality Date  . Vaginal hysterectomy    . Cholecystectomy    . Total abdominal hysterectomy w/ bilateral salpingoophorectomy    . Foot surgery      right x 2  . Colonoscopy      2001, 2008.  Father has colon cancer  . Bunionectomy      11/14/2011  . Abdominal hysterectomy    . 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 Colon cancer Father 76  . Stomach cancer Neg Hx   . Rectal cancer Neg Hx   . Esophageal cancer Neg Hx   . Breast cancer Mother   . Breast cancer Father   . Uterine cancer Mother    History  Substance Use Topics  . Smoking status: Never Smoker   . Smokeless tobacco: Never Used  . Alcohol Use: No   OB History   Grav Para Term Preterm Abortions TAB SAB Ect Mult Living                 Review of Systems  Constitutional: Negative for fever.  Gastrointestinal: Positive for nausea and vomiting.  Neurological: Positive for dizziness and headaches. Negative for weakness and numbness.  All other systems reviewed and are negative.  Allergies  Review of patient's allergies indicates no known allergies.  Home Medications   Prior to Admission medications   Medication Sig Start Date End Date Taking? Authorizing Provider  risperiDONE (RISPERDAL) 1 MG tablet Take 1 mg by mouth at bedtime.   Yes Historical Provider, MD  hydrOXYzine (VISTARIL) 25 MG capsule Take 25 mg by mouth at bedtime.    Historical Provider, MD  lithium carbonate (LITHOBID) 300 MG  CR tablet Take 300 mg by mouth every evening.     Historical Provider, MD  potassium chloride SA (K-DUR,KLOR-CON) 20 MEQ tablet Take 2 tablets (40 mEq total) by mouth daily. 07/04/13   [name], MD  pregabalin (LYRICA) 100 MG capsule Take 1 capsule (100 mg total) by mouth 2 (two) times daily. 07/04/13   [name], MD  promethazine (PHENERGAN) 25 MG tablet Take 1 tablet (25 mg total) by mouth every 6 (six) hours as needed for nausea or vomiting. 09/14/13   Leeanne Rio, PA-C  QUEtiapine (SEROQUEL) 400 MG tablet Take 800 mg by mouth at bedtime.     Historical Provider, MD  SUMAtriptan (IMITREX) 25 MG tablet TAKE 1 TABLET BY MOUTH ONCE. MAY REPEAT IN 2 HOURS IF HEADACHE PERSISTS OR RECURS.    Leeanne Rio, PA-C  Tapentadol HCl (NUCYNTA) 75 MG TABS Take 1 tablet by  mouth as directed. Q4-6 HOURS, MAX 5 TABS DAILY    Historical Provider, MD  tetracycline (ACHROMYCIN,SUMYCIN) 500 MG capsule Take 1 capsule (500 mg total) by mouth 2 (two) times daily. 09/14/13   Leeanne Rio, PA-C  traMADol (ULTRAM) 50 MG tablet Take 1 tablet (50 mg total) by mouth every 6 (six) hours as needed for moderate pain. 07/04/13   [name], MD  traZODone (DESYREL) 100 MG tablet Take 400 mg by mouth at bedtime.    Historical Provider, MD  zolpidem (AMBIEN) 10 MG tablet Take 10 mg by mouth at bedtime as needed for sleep. For sleep    Historical Provider, MD   BP 119/73  Pulse 108  Temp(Src) 98.6 F (37 C) (Oral)  Resp 18  Ht 5\' 6"  (1.676 m)  Wt 200 lb (90.719 kg)  BMI 32.30 kg/m2  SpO2 100% Physical Exam  Nursing note and vitals reviewed. Constitutional: She appears well-developed and well-nourished. No distress.  HENT:  Head: Normocephalic and atraumatic.  Eyes: Conjunctivae are normal. Right eye exhibits no discharge. Left eye exhibits no discharge.  Neck: Neck supple.  Cardiovascular: Normal rate, regular rhythm and normal heart sounds.  Exam reveals no gallop and no friction rub.   No murmur heard. Pulmonary/Chest: Effort normal and breath sounds normal. No respiratory distress.  Abdominal: Soft. She exhibits no distension. There is no tenderness.  Musculoskeletal: She exhibits no edema and no tenderness.  Neurological: She is alert.  No nuchal rigidity. Disconjugate gaze. Cranial nerves otherwise intact. Strength 5/5 upper and lower extremities.   Skin: Skin is warm and dry.  Psychiatric: She has a normal mood and affect. Her behavior is normal. Thought content normal.    ED Course  Procedures (including critical care time) DIAGNOSTIC STUDIES: Oxygen Saturation is 100% on room air, normal by my interpretation.    COORDINATION OF CARE: 5:28 PM-Discussed treatment plan which includes fluids with pt at bedside and pt agreed to plan.   Labs  Review Labs Reviewed - No data to display  Imaging Review No results found.   EKG Interpretation None      MDM   Final diagnoses:  Nonintractable episodic headache, unspecified headache type    48yF with headache. Suspect primary HA. Consider emergent secondary causes such as bleed, infectious or mass but doubt. There is no history of trauma. Pt has a nonfocal neurological exam. Afebrile and neck supple. No use of blood thinning medication. Consider ocular etiology such as acute angle closure glaucoma but doubt. Pt denies acute change in visual acuity and eye exam unremarkable. Doubt temporal arteritis given  age, no temporal tenderness and temporal artery pulsations palpable. Doubt CO poisoning. No contacts with similar symptoms. Doubt venous thrombosis. Doubt carotid or vertebral arteries dissection. Symptoms improved with meds. Feel that can be safely discharged, but strict return precautions discussed. Outpt fu.  I personally preformed the services scribed in my presence. The recorded information has been reviewed is accurate. [name] Manifold, MD.    [name] Manifold, MD 10/16/13 2026

## 2013-11-02 ENCOUNTER — Ambulatory Visit (AMBULATORY_SURGERY_CENTER): Payer: Medicare (Managed Care)

## 2013-11-02 VITALS — Ht 66.0 in | Wt 205.0 lb

## 2013-11-02 DIAGNOSIS — Z800 Family history of malignant neoplasm of digestive organs: Secondary | ICD-10-CM

## 2013-11-02 MED ORDER — MOVIPREP 100 G PO SOLR: 1.0000 | Freq: Once | ORAL | Status: DC

## 2013-11-02 NOTE — Progress Notes (Signed)
No allergies to eggs or soy No home oxygen No past problems with anesthesia No diet/weight loss meds  Has email.  Emmi instructions given for colonoscopy. 

## 2013-11-16 ENCOUNTER — Ambulatory Visit (AMBULATORY_SURGERY_CENTER): Payer: Commercial Managed Care - HMO | Admitting: Gastroenterology

## 2013-11-16 ENCOUNTER — Encounter: Payer: Self-pay | Admitting: Gastroenterology

## 2013-11-16 VITALS — BP 136/78 | HR 82 | Temp 96.1°F | Resp 13 | Ht 66.0 in | Wt 205.0 lb

## 2013-11-16 DIAGNOSIS — D1330 Benign neoplasm of unspecified part of small intestine: Secondary | ICD-10-CM

## 2013-11-16 DIAGNOSIS — R933 Abnormal findings on diagnostic imaging of other parts of digestive tract: Secondary | ICD-10-CM

## 2013-11-16 DIAGNOSIS — Z800 Family history of malignant neoplasm of digestive organs: Secondary | ICD-10-CM

## 2013-11-16 DIAGNOSIS — D126 Benign neoplasm of colon, unspecified: Secondary | ICD-10-CM

## 2013-11-16 MED ORDER — SODIUM CHLORIDE 0.9 % IV SOLN: 500.0000 mL | INTRAVENOUS | Status: DC

## 2013-11-16 NOTE — Progress Notes (Signed)
Called to room to assist during endoscopic procedure.  Patient ID and intended procedure confirmed with present staff. Received instructions for my participation in the procedure from the performing physician.  

## 2013-11-16 NOTE — Patient Instructions (Signed)
YOU HAD AN ENDOSCOPIC PROCEDURE TODAY AT THE [facility]: Refer to the procedure report that was given to you for any specific questions about what was found during the examination.  If the procedure report does not answer your questions, please call your gastroenterologist to clarify.  If you requested that your care partner not be given the details of your procedure findings, then the procedure report has been included in a sealed envelope for you to review at your convenience later.  YOU SHOULD EXPECT: Some feelings of bloating in the abdomen. Passage of more gas than usual.  Walking can help get rid of the air that was put into your GI tract during the procedure and reduce the bloating. If you had a lower endoscopy (such as a colonoscopy or flexible sigmoidoscopy) you may notice spotting of blood in your stool or on the toilet paper. If you underwent a bowel prep for your procedure, then you may not have a normal bowel movement for a few days.  DIET: Your first meal following the procedure should be a light meal and then it is ok to progress to your normal diet.  A half-sandwich or bowl of soup is an example of a good first meal.  Heavy or fried foods are harder to digest and may make you feel nauseous or bloated.  Likewise meals heavy in dairy and vegetables can cause extra gas to form and this can also increase the bloating.  Drink plenty of fluids but you should avoid alcoholic beverages for 24 hours.  ACTIVITY: Your care partner should take you home directly after the procedure.  You should plan to take it easy, moving slowly for the rest of the day.  You can resume normal activity the day after the procedure however you should NOT DRIVE or use heavy machinery for 24 hours (because of the sedation medicines used during the test).    SYMPTOMS TO REPORT IMMEDIATELY: A gastroenterologist can be reached at any hour.  During normal business hours, 8:30 AM to 5:00 PM Monday through Friday,  call [phone].  After hours and on weekends, please call the GI answering service at [phone] who will take a message and have the physician on call contact you.   Following lower endoscopy (colonoscopy or flexible sigmoidoscopy):  Excessive amounts of blood in the stool  Significant tenderness or worsening of abdominal pains  Swelling of the abdomen that is new, acute  Fever of 100F or higher  FOLLOW UP: If any biopsies were taken you will be contacted by phone or by letter within the next 1-3 weeks.  Call your gastroenterologist if you have not heard about the biopsies in 3 weeks.  Our staff will call the home number listed on your records the next business day following your procedure to check on you and address any questions or concerns that you may have at that time regarding the information given to you following your procedure. This is a courtesy call and so if there is no answer at the home number and we have not heard from you through the emergency physician on call, we will assume that you have returned to your regular daily activities without incident.  SIGNATURES/CONFIDENTIALITY: You and/or your care partner have signed paperwork which will be entered into your electronic medical record.  These signatures attest to the fact that that the information above on your After Visit Summary has been reviewed and is understood.  Full responsibility of the confidentiality of this   discharge information lies with you and/or your care-partner.  Resume medications. 

## 2013-11-16 NOTE — Op Note (Signed)
[facility]  [address] [place], [id number]   COLONOSCOPY PROCEDURE REPORT  PATIENT: [name], [name]  MR#: [id number] BIRTHDATE: Jul 30, 1965 , 81  yrs. old GENDER: Female ENDOSCOPIST: Milus Banister, MD PROCEDURE DATE:  11/16/2013 PROCEDURE:   Colonoscopy with biopsy First Screening Colonoscopy - Avg.  risk and is 50 yrs.  old or older - No.  Prior Negative Screening - Now for repeat screening. N/A  History of Adenoma - Now for follow-up colonoscopy & has been > or = to 3 yrs.  N/A  Polyps Removed Today? No.  Recommend repeat exam, <10 yrs? Yes.  High risk (family or personal hx). ASA CLASS:   Class III INDICATIONS:father had colon cancer in his 71's; colonoscopy 2013 Dr.  Ardis Hughs with poor prep, recommended repeat in a few weeks with double prep. MEDICATIONS: MAC sedation, administered by CRNA and Propofol (Diprivan) 280 mg IV  DESCRIPTION OF PROCEDURE:   After the risks benefits and alternatives of the procedure were thoroughly explained, informed consent was obtained.  A digital rectal exam revealed no abnormalities of the rectum.   The LB PCF Q180 [id number]  endoscope was introduced through the anus and advanced to the terminal ileum which was intubated for a short distance. No adverse events experienced.   The quality of the prep was adequate.  The instrument was then slowly withdrawn as the colon was fully examined.  COLON FINDINGS: The terminal ileum appeared normal but given colonic findings I biopsied this and sent to pathology (jar 1).  There was erythema, granularity throughout most of the colon.  Not clearly inflammed.  Biopsies were taken from right colon (jar 2), left colon (jar 3) and rectosigmoid (jar 4).  The cecum and rectosigmoid segments were most erythematous appearing.  The examination was otherwise normal.  Retroflexed views revealed no abnormalities. The time to cecum=8 minutes 06 seconds.  Withdrawal time=9 minutes 24 seconds.  The  scope was withdrawn and the procedure completed. COMPLICATIONS: There were no complications.  ENDOSCOPIC IMPRESSION: See above, unclear clinical signficance of the findings: she has been on/off antibiotics a lot recently, perhaps mild or resolving C. diff colitis. No polyps or cancers.  RECOMMENDATIONS: Given your significant family history of colon cancer, you should have a repeat colonoscopy in 5 years   eSigned:  Milus Banister, MD 11/16/2013 10:10 AM

## 2013-11-16 NOTE — Progress Notes (Signed)
Report to PACU, RN, vss, BBS= Clear.  

## 2013-11-17 ENCOUNTER — Telehealth: Payer: Self-pay | Admitting: *Deleted

## 2013-11-17 NOTE — Telephone Encounter (Signed)
No answer, left message to call if questions or concerns. 

## 2013-11-22 ENCOUNTER — Encounter: Payer: Self-pay | Admitting: Gastroenterology

## 2013-11-27 ENCOUNTER — Encounter (HOSPITAL_BASED_OUTPATIENT_CLINIC_OR_DEPARTMENT_OTHER): Payer: Self-pay | Admitting: Emergency Medicine

## 2013-11-27 ENCOUNTER — Emergency Department (HOSPITAL_BASED_OUTPATIENT_CLINIC_OR_DEPARTMENT_OTHER)
Admission: EM | Admit: 2013-11-27 | Discharge: 2013-11-27 | Disposition: A | Discharge status: Home / Self Care | Payer: Medicare HMO | Attending: Emergency Medicine | Admitting: Emergency Medicine

## 2013-11-27 DIAGNOSIS — K089 Disorder of teeth and supporting structures, unspecified: Secondary | ICD-10-CM | POA: Insufficient documentation

## 2013-11-27 DIAGNOSIS — Z79899 Other long term (current) drug therapy: Secondary | ICD-10-CM | POA: Diagnosis not present

## 2013-11-27 DIAGNOSIS — Z8701 Personal history of pneumonia (recurrent): Secondary | ICD-10-CM | POA: Diagnosis not present

## 2013-11-27 DIAGNOSIS — Z8679 Personal history of other diseases of the circulatory system: Secondary | ICD-10-CM | POA: Diagnosis not present

## 2013-11-27 DIAGNOSIS — Z87442 Personal history of urinary calculi: Secondary | ICD-10-CM | POA: Insufficient documentation

## 2013-11-27 DIAGNOSIS — F3177 Bipolar disorder, in partial remission, most recent episode mixed: Secondary | ICD-10-CM | POA: Diagnosis not present

## 2013-11-27 DIAGNOSIS — K0889 Other specified disorders of teeth and supporting structures: Secondary | ICD-10-CM

## 2013-11-27 DIAGNOSIS — Z87448 Personal history of other diseases of urinary system: Secondary | ICD-10-CM | POA: Insufficient documentation

## 2013-11-27 DIAGNOSIS — G43909 Migraine, unspecified, not intractable, without status migrainosus: Secondary | ICD-10-CM | POA: Insufficient documentation

## 2013-11-27 DIAGNOSIS — F411 Generalized anxiety disorder: Secondary | ICD-10-CM | POA: Insufficient documentation

## 2013-11-27 MED ORDER — OXYCODONE-ACETAMINOPHEN 5-325 MG PO TABS: 2.0000 | ORAL_TABLET | ORAL | Status: DC | PRN

## 2013-11-27 NOTE — ED Notes (Signed)
Pt reports tooth pulled on Wednesday. Sts increased pain. Out of pain medicine.

## 2013-11-27 NOTE — Discharge Instructions (Signed)

## 2013-11-27 NOTE — ED Provider Notes (Signed)
CSN: [id number]     Arrival date & time 11/27/13  1404 History   First MD Initiated Contact with Patient 11/27/13 1528     Chief Complaint  Patient presents with  . Dental Pain     (Consider location/radiation/quality/duration/timing/severity/associated sxs/prior Treatment) Patient is a 48 y.o. female presenting with tooth pain. The history is provided by the patient. No language interpreter was used.  Dental Pain Location:  Lower Quality:  No pain Severity:  Moderate Onset quality:  Gradual Timing:  Constant Progression:  Worsening Chronicity:  New Context: recent dental surgery   Previous work-up:  Dental exam Relieved by:  Nothing Ineffective treatments:  None tried Associated symptoms: no fever   Pt had her tooth pulled on Wednesday.  Pt is out of her percocet.  Pt is suppose to see her Md for recheck  Past Medical History  Diagnosis Date  . Dysrhythmia     Left sided heart diease  . Mental disorder   . Bipolar affective disorder, mixed, in partial or remission   . Headache(784.0)   . Anxiety   . Depression   . 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 History of gallstones   . PONV (postoperative nausea and vomiting)   . Sleep apnea   . Renal disorder   . Kidney stones   . Pneumonia     Hx: of as a child  . Migraine    Past Surgical History  Procedure Laterality Date  . Vaginal hysterectomy    . Cholecystectomy    . Total abdominal hysterectomy w/ bilateral salpingoophorectomy    . Foot surgery      right x 2  . Colonoscopy      2001, 2008.  Father has colon cancer  . Bunionectomy      11/14/2011  . Abdominal hysterectomy    . 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 Colon cancer Father 27  . Stomach cancer Neg Hx    . Rectal cancer Neg Hx   . Esophageal cancer Neg Hx   . Breast cancer Mother   . Breast cancer Father   . Uterine cancer Mother    History  Substance Use Topics  . Smoking status: Never Smoker   . Smokeless tobacco: Never Used  . Alcohol Use: No   OB History   Grav Para Term Preterm Abortions TAB SAB Ect Mult Living                 Review of Systems  Constitutional: Negative for fever.  HENT: Positive for dental problem.   All other systems reviewed and are negative.     Allergies  Review of patient's allergies indicates no known allergies.  Home Medications   Prior to Admission medications   Medication Sig Start Date End Date Taking? Authorizing Provider  hydrOXYzine (VISTARIL) 25 MG capsule Take 25 mg by mouth at bedtime.    Historical Provider, MD  lithium carbonate (LITHOBID) 300 MG CR tablet Take 300 mg by mouth every evening.     Historical Provider, MD  potassium chloride SA (K-DUR,KLOR-CON) 20 MEQ tablet Take 2 tablets (40 mEq total) by mouth daily. 07/04/13   [name], MD  pregabalin (LYRICA) 100 MG capsule Take 1 capsule (  100 mg total) by mouth 2 (two) times daily. 07/04/13   [name], MD  promethazine (PHENERGAN) 25 MG tablet Take 1 tablet (25 mg total) by mouth every 6 (six) hours as needed for nausea or vomiting. 09/14/13   Brunetta Jeans, PA-C  QUEtiapine (SEROQUEL) 400 MG tablet Take 800 mg by mouth at bedtime.     Historical Provider, MD  risperiDONE (RISPERDAL) 1 MG tablet Take 1 mg by mouth at bedtime.    Historical Provider, MD  SUMAtriptan (IMITREX) 25 MG tablet TAKE 1 TABLET BY MOUTH ONCE. MAY REPEAT IN 2 HOURS IF HEADACHE PERSISTS OR RECURS.    Brunetta Jeans, PA-C  Tapentadol HCl (NUCYNTA) 75 MG TABS Take 1 tablet by mouth as directed. Q4-6 HOURS, MAX 5 TABS DAILY    Historical Provider, MD  traMADol (ULTRAM) 50 MG tablet Take 1 tablet (50 mg total) by mouth every 6 (six) hours as needed for moderate pain. 07/04/13   [name], MD  zolpidem (AMBIEN) 10 MG tablet Take 10 mg by mouth at bedtime as needed for sleep. For sleep    Historical Provider, MD   BP 111/83  Pulse 100  Temp(Src) 98.4 F (36.9 C) (Oral)  Resp 18  Ht 5\' 7"  (1.702 m)  Wt 200 lb (90.719 kg)  BMI 31.32 kg/m2  SpO2 98% Physical Exam  Nursing note and vitals reviewed. Constitutional: She is oriented to person, place, and time. She appears well-developed and well-nourished.  HENT:  Head: Normocephalic and atraumatic.  Right Ear: External ear normal.  Left Ear: External ear normal.  Nose: Nose normal.  Slight swelling at extraction site.  Eyes: EOM are normal. Pupils are equal, round, and reactive to light.  Neck: Normal range of motion.  Cardiovascular: Normal rate.   Pulmonary/Chest: Effort normal.  Musculoskeletal: Normal range of motion.  Neurological: She is alert and oriented to person, place, and time.  Skin: Skin is warm.  Psychiatric: She has a normal mood and affect.    ED Course  Procedures (including critical care time) Labs Review Labs Reviewed - No data to display  Imaging Review No results found.   EKG Interpretation None      MDM   Final diagnoses:  Pain, dental    Continue Boody [place], [place] 11/27/13 [phone]

## 2013-12-02 NOTE — ED Provider Notes (Signed)
Medical screening examination/treatment/procedure(s) were performed by non-physician practitioner and as supervising physician I was immediately available for consultation/collaboration.   EKG Interpretation None        Tanna Furry, MD 12/02/13 0005

## 2013-12-12 ENCOUNTER — Encounter: Payer: Self-pay | Admitting: Physician Assistant

## 2013-12-12 ENCOUNTER — Ambulatory Visit (INDEPENDENT_AMBULATORY_CARE_PROVIDER_SITE_OTHER): Payer: Commercial Managed Care - HMO | Admitting: Physician Assistant

## 2013-12-12 ENCOUNTER — Encounter: Payer: Self-pay | Admitting: *Deleted

## 2013-12-12 VITALS — BP 138/91 | HR 96 | Temp 98.2°F | Resp 16 | Ht 67.0 in | Wt 204.4 lb

## 2013-12-12 DIAGNOSIS — M654 Radial styloid tenosynovitis [de Quervain]: Secondary | ICD-10-CM

## 2013-12-12 NOTE — Progress Notes (Signed)
Patient presents to clinic today c/o pain and tingling of thumb and forefinger of her let hand x 1 week.  Denies trauma or injury.  Denies decreased ROM.  Has not taken anything for pain.  Past Medical History  Diagnosis Date  . Dysrhythmia     Left sided heart diease  . Mental disorder   . Bipolar affective disorder, mixed, in partial or remission   . Headache(784.0)   . Anxiety   . Depression   . 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 History of gallstones   . PONV (postoperative nausea and vomiting)   . Sleep apnea   . Renal disorder   . Kidney stones   . Pneumonia     Hx: of as a child  . Migraine     Current Outpatient Prescriptions on File Prior to Visit  Medication Sig Dispense Refill  . hydrOXYzine (VISTARIL) 25 MG capsule Take 25 mg by mouth at bedtime.      Marland Kitchen lithium carbonate (LITHOBID) 300 MG CR tablet Take 300 mg by mouth every evening.       Marland Kitchen oxyCODONE-acetaminophen (PERCOCET/ROXICET) 5-325 MG per tablet Take 2 tablets by mouth every 4 (four) hours as needed for severe pain.  10 tablet  0  . potassium chloride SA (K-DUR,KLOR-CON) 20 MEQ tablet Take 2 tablets (40 mEq total) by mouth daily.  20 tablet  0  . pregabalin (LYRICA) 100 MG capsule Take 1 capsule (100 mg total) by mouth 2 (two) times daily.  60 capsule  0  . promethazine (PHENERGAN) 25 MG tablet Take 1 tablet (25 mg total) by mouth every 6 (six) hours as needed for nausea or vomiting.  30 tablet  0  . QUEtiapine (SEROQUEL) 400 MG tablet Take 800 mg by mouth at bedtime.       . risperiDONE (RISPERDAL) 1 MG tablet Take 1 mg by mouth at bedtime.      . SUMAtriptan (IMITREX) 25 MG tablet TAKE 1 TABLET BY MOUTH ONCE. MAY REPEAT IN 2 HOURS IF HEADACHE PERSISTS OR RECURS.  10 tablet  0  . Tapentadol HCl (NUCYNTA) 75 MG TABS Take 1 tablet by mouth as directed. Q4-6 HOURS, MAX 5 TABS DAILY      . traMADol (ULTRAM) 50 MG tablet Take 1 tablet (50 mg total) by mouth every 6 (six) hours as needed  for moderate pain.  30 tablet  0  . zolpidem (AMBIEN) 10 MG tablet Take 10 mg by mouth at bedtime as needed for sleep. For sleep       No current facility-administered medications on file prior to visit.    No Known Allergies  Family History  Problem Relation Age of Onset  . Colon cancer Father 20  . Stomach cancer Neg Hx   . Rectal cancer Neg Hx   . Esophageal cancer Neg Hx   . Breast cancer Mother   . Breast cancer Father   . Uterine cancer Mother     History   Social History  . Marital Status: Married    Spouse Name: N/A    Number of Children: 6  . Years of Education: N/A   Occupational History  . nursing student    Social History Main Topics  . Smoking status: Never Smoker   . Smokeless tobacco: Never Used  . Alcohol Use: No  . Drug Use: No     Comment: see triage note  . Sexual Activity: No  Other Topics Concern  . 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  All other ROS are negative.  BP 138/91  Pulse 96  Temp(Src) 98.2 F (36.8 C) (Oral)  Resp 16  Ht 5\' 7"  (1.702 m)  Wt 204 lb 6 oz (92.704 kg)  BMI 32.00 kg/m2  SpO2 100%  Physical Exam  Vitals reviewed. Constitutional: She is well-developed, well-nourished, and in no distress.  HENT:  Head: Normocephalic and atraumatic.  Eyes: Conjunctivae are normal.  Cardiovascular: Normal rate, regular rhythm, normal heart sounds and intact distal pulses.   Pulmonary/Chest: Effort normal and breath sounds normal.  Musculoskeletal:       Left hand: She exhibits tenderness. She exhibits normal range of motion, no bony tenderness, normal two-point discrimination, normal capillary refill and no swelling. Normal sensation  noted. She exhibits no finger abduction, no thumb/finger opposition and no wrist extension trouble.  Tinel and Phalen tests are negative.  + Finklestein's test.  Skin: Skin is warm and dry. No rash noted.  Psychiatric: Affect normal.    Recent Results (from the past 2160 hour(s))  CULTURE, URINE COMPREHENSIVE     Status: None   Collection Time    09/14/13  9:24 AM      Result Value Ref Range   Colony Count NO GROWTH     Organism ID, Bacteria NO GROWTH    URINALYSIS, ROUTINE W REFLEX MICROSCOPIC     Status: Abnormal   Collection Time    09/14/13  9:24 AM      Result Value Ref Range   Color, Urine YELLOW  YELLOW   APPearance CLEAR  CLEAR   Specific Gravity, Urine 1.008  1.005 - 1.030   pH 7.0  5.0 - 8.0   Glucose, UA NEG  NEG mg/dL   Bilirubin Urine NEG  NEG   Ketones, ur NEG  NEG mg/dL   Hgb urine dipstick NEG  NEG   Protein, ur NEG  NEG mg/dL   Urobilinogen, UA 0.2  0.0 - 1.0 mg/dL   Nitrite NEG  NEG   Leukocytes, UA SMALL (*) NEG  URINALYSIS, MICROSCOPIC ONLY     Status: None   Collection Time    09/14/13  9:24 AM      Result Value Ref Range   Squamous Epithelial / LPF FEW  RARE   Crystals NONE SEEN  NONE SEEN   Casts NONE SEEN  NONE SEEN   WBC, UA 0-2  <3 WBC/hpf   RBC / HPF 0-2  <3 RBC/hpf   Bacteria, UA NONE SEEN  RARE  POCT URINALYSIS DIPSTICK     Status: None   Collection Time    09/14/13  5:17 PM      Result Value Ref Range   Color, UA Golden     Clarity, UA murky     Glucose, UA neg     Bilirubin, UA neg     Ketones, UA neg     Spec Grav, UA 1.015     Blood, UA neg     pH, UA 7.0     Protein,  UA Trace     Urobilinogen, UA 0.2     Nitrite, UA neg     Leukocytes, UA small (1+)      Assessment/Plan: De Quervain's tenosynovitis, left Thumb spica splint applied.  Instructed patient to wear most of day and nighttime over the next 2 weeks.  Continue pain medications.  Apply topical Aspercreme to affected area.  Avoid heavy lifting.  Follow-up if symptoms  are not improving.  May need Specialist evaluation if symptoms persist.

## 2013-12-12 NOTE — Progress Notes (Signed)
Pre visit review using our clinic review tool, if applicable. No additional management support is needed unless otherwise documented below in the visit note/SLS  

## 2013-12-12 NOTE — Patient Instructions (Signed)
Please wear the thumb/wrist splint throughout most of the day and night.  Make sure to take off a couple of times per day to let the skin breathe.  Continue your pain medications as directed.  Rub topical Aspercreme to the area of concern.  Ice the area a few times per day as well.  Call if symptoms are not improving within a week.  We may need to set you up with a Sports Medicine doctor for further treatment.  De Quervain's Disease Harriet Pho disease is a condition often seen in racquet sports where there is a soreness (inflammation) in the cord like structures (tendons) which attach muscle to bone on the thumb side of the wrist. There may be a tightening of the tissuesaround the tendons. This condition is often helped by giving up or modifying the activity which caused it. When conservative treatment does not help, surgery may be required. Conservative treatment could include changes in the activity which brought about the problem or made it worse. Anti-inflammatory medications and injections may be used to help decrease the inflammation and help with pain control. Your caregiver will help you determine which is best for you. DIAGNOSIS  Often the diagnosis (learning what is wrong) can be made by examination. Sometimes x-rays are required. HOME CARE INSTRUCTIONS   Apply ice to the sore area for 15-20 minutes, 03-04 times per day while awake. Put the ice in a plastic bag and place a towel between the bag of ice and your skin. This is especially helpful if it can be done after all activities involving the sore wrist.  Temporary splinting may help.  Only take over-the-counter or prescription medicines for pain, discomfort or fever as directed by your caregiver. SEEK MEDICAL CARE IF:   Pain relief is not obtained with medications, or if you have increasing pain and seem to be getting worse rather than better. MAKE SURE YOU:   Understand these instructions.  Will watch your condition.  Will get  help right away if you are not doing well or get worse. Document Released: 11/26/2000 Document Revised: 05/26/2011 Document Reviewed: 07/06/2013 [facility] Patient Information 2015 [place], [place]. This information is not intended to replace advice given to you by your health care provider. Make sure you discuss any questions you have with your health care provider.

## 2013-12-12 NOTE — Assessment & Plan Note (Signed)
Thumb spica splint applied.  Instructed patient to wear most of day and nighttime over the next 2 weeks.  Continue pain medications.  Apply topical Aspercreme to affected area.  Avoid heavy lifting.  Follow-up if symptoms are not improving.  May need Specialist evaluation if symptoms persist.

## 2013-12-27 ENCOUNTER — Encounter (HOSPITAL_BASED_OUTPATIENT_CLINIC_OR_DEPARTMENT_OTHER): Payer: Self-pay | Admitting: Emergency Medicine

## 2013-12-27 ENCOUNTER — Emergency Department (HOSPITAL_BASED_OUTPATIENT_CLINIC_OR_DEPARTMENT_OTHER)
Admission: EM | Admit: 2013-12-27 | Discharge: 2013-12-27 | Disposition: A | Discharge status: Home / Self Care | Payer: Medicare PPO | Attending: Emergency Medicine | Admitting: Emergency Medicine

## 2013-12-27 DIAGNOSIS — Z9889 Other specified postprocedural states: Secondary | ICD-10-CM | POA: Insufficient documentation

## 2013-12-27 DIAGNOSIS — Z87442 Personal history of urinary calculi: Secondary | ICD-10-CM | POA: Diagnosis not present

## 2013-12-27 DIAGNOSIS — Z8701 Personal history of pneumonia (recurrent): Secondary | ICD-10-CM | POA: Insufficient documentation

## 2013-12-27 DIAGNOSIS — G43909 Migraine, unspecified, not intractable, without status migrainosus: Secondary | ICD-10-CM | POA: Diagnosis not present

## 2013-12-27 DIAGNOSIS — I499 Cardiac arrhythmia, unspecified: Secondary | ICD-10-CM | POA: Diagnosis not present

## 2013-12-27 DIAGNOSIS — F329 Major depressive disorder, single episode, unspecified: Secondary | ICD-10-CM | POA: Diagnosis not present

## 2013-12-27 DIAGNOSIS — G8929 Other chronic pain: Secondary | ICD-10-CM | POA: Diagnosis not present

## 2013-12-27 DIAGNOSIS — M79642 Pain in left hand: Secondary | ICD-10-CM | POA: Insufficient documentation

## 2013-12-27 DIAGNOSIS — Z8719 Personal history of other diseases of the digestive system: Secondary | ICD-10-CM | POA: Insufficient documentation

## 2013-12-27 DIAGNOSIS — Z79899 Other long term (current) drug therapy: Secondary | ICD-10-CM | POA: Diagnosis not present

## 2013-12-27 MED ORDER — TRAMADOL HCL 50 MG PO TABS
50.0000 mg | ORAL_TABLET | Freq: Once | ORAL | Status: AC
  Administered 2013-12-27: 50 mg via ORAL
  Filled 2013-12-27: qty 1

## 2013-12-27 NOTE — Discharge Instructions (Signed)
Call your primary care physician and pain management doctor regarding your pain medication.  Chronic Pain Chronic pain can be defined as pain that is off and on and lasts for 3-6 months or longer. Many things cause chronic pain, which can make it difficult to make a diagnosis. There are many treatment options available for chronic pain. However, finding a treatment that works well for you may require trying various approaches until the right one is found. Many people benefit from a combination of two or more types of treatment to control their pain. SYMPTOMS  Chronic pain can occur anywhere in the body and can range from mild to very severe. Some types of chronic pain include:  Headache.  Low back pain.  Cancer pain.  Arthritis pain.  Neurogenic pain. This is pain resulting from damage to nerves. People with chronic pain may also have other symptoms such as:  Depression.  Anger.  Insomnia.  Anxiety. DIAGNOSIS  Your health care provider will help diagnose your condition over time. In many cases, the initial focus will be on excluding possible conditions that could be causing the pain. Depending on your symptoms, your health care provider may order tests to diagnose your condition. Some of these tests may include:   Blood tests.   CT scan.   MRI.   X-rays.   Ultrasounds.   Nerve conduction studies.  You may need to see a specialist.  TREATMENT  Finding treatment that works well may take time. You may be referred to a pain specialist. He or she may prescribe medicine or therapies, such as:   Mindful meditation or yoga.  Shots (injections) of numbing or pain-relieving medicines into the spine or area of pain.  Local electrical stimulation.  Acupuncture.   Massage therapy.   Aroma, color, light, or sound therapy.   Biofeedback.   Working with a physical therapist to keep from getting stiff.   Regular, gentle exercise.   Cognitive or behavioral  therapy.   Group support.  Sometimes, surgery may be recommended.  HOME CARE INSTRUCTIONS   Take all medicines as directed by your health care provider.   Lessen stress in your life by relaxing and doing things such as listening to calming music.   Exercise or be active as directed by your health care provider.   Eat a healthy diet and include things such as vegetables, fruits, fish, and lean meats in your diet.   Keep all follow-up appointments with your health care provider.   Attend a support group with others suffering from chronic pain. SEEK MEDICAL CARE IF:   Your pain gets worse.   You develop a new pain that was not there before.   You cannot tolerate medicines given to you by your health care provider.   You have new symptoms since your last visit with your health care provider.  SEEK IMMEDIATE MEDICAL CARE IF:   You feel weak.   You have decreased sensation or numbness.   You lose control of bowel or bladder function.   Your pain suddenly gets much worse.   You develop shaking.  You develop chills.  You develop confusion.  You develop chest pain.  You develop shortness of breath.  MAKE SURE YOU:  Understand these instructions.  Will watch your condition.  Will get help right away if you are not doing well or get worse. Document Released: 11/23/2001 Document Revised: 11/03/2012 Document Reviewed: 08/27/2012 [facility] Patient Information 2015 [place], [place]. This information is not intended to replace  advice given to you by your health care provider. Make sure you discuss any questions you have with your health care provider. De Quervain's Tenosynovitis De Quervain's tenosynovitis involves inflammation of one or two tendon linings (sheaths) or strain of one or two tendons to the thumb: extensor pollicis brevis (EPB), or abductor pollicis longus (APL). This causes pain on the side of the wrist and base of the thumb. Tendon sheaths secrete  a fluid that lubricates the tendon, allowing the tendon to move smoothly. When the sheath becomes inflamed, the tendon cannot move freely in the sheath. Both the EPB and APL tendons are important for proper use of the hand. The EPB tendon is important for straightening the thumb. The APL tendon is important for moving the thumb away from the index finger (abducting). The two tendons pass through a small tube (canal) in the wrist, near the base of the thumb. When the tendons become inflamed, pain is usually felt in this area. SYMPTOMS   Pain, tenderness, swelling, warmth, or redness over the base of the thumb and thumb side of the wrist.  Pain that gets worse when straightening the thumb.  Pain that gets worse when moving the thumb away from the index finger, against resistance.  Pain with pinching or gripping.  Locking or catching of the thumb.  Limited motion of the thumb.  Crackling sound (crepitation) when the tendon or thumb is moved or touched.  Fluid-filled cyst in the area of the base of the thumb. CAUSES   Tenosynovitis is often linked with overuse of the wrist.  Tenosynovitis may be caused by repeated injury to the thumb muscle and tendon units, and with repeated motions of the hand and wrist, due to friction of the tendon within the lining (sheath).  Tenosynovitis may also be due to a sudden increase in activity or change in activity. RISK INCREASES WITH:  Sports that involve repeated hand and wrist motions (golf, bowling, tennis, squash, racquetball).  Heavy labor.  Poor physical wrist strength and flexibility.  Failure to warm up properly before practice or play.  Female gender.  New mothers who hold their baby's head for long periods or lift infants with thumbs in the infant's armpit (axilla). PREVENTION  Warm up and stretch properly before practice or competition.  Allow enough time for rest and recovery between practices and competition.  Maintain  appropriate conditioning:  Cardiovascular fitness.  Forearm, wrist, and hand flexibility.  Muscle strength and endurance.  Use proper exercise technique. PROGNOSIS  This condition is usually curable within 6 weeks, if treated properly with non-surgical treatment and resting of the affected area.  RELATED COMPLICATIONS   Longer healing time if not properly treated or if not given enough time to heal.  Chronic inflammation, causing recurring symptoms of tenosynovitis. Permanent pain or restriction of movement.  Risks of surgery: infection, bleeding, injury to nerves (numbness of the thumb), continued pain, incomplete release of the tendon sheath, recurring symptoms, cutting of the tendons, tendons sliding out of position, weakness of the thumb, thumb stiffness. TREATMENT  First, treatment involves the use of medicine and ice, to reduce pain and inflammation. Patients are encouraged to stop or modify activities that aggravate the injury. Stretching and strengthening exercises may be advised. Exercises may be completed at home or with a therapist. You may be fitted with a brace or splint, to limit motion and allow the injury to heal. Your caregiver may also choose to give you a corticosteroid injection, to reduce the pain and  inflammation. If non-surgical treatment is not successful, surgery may be needed. Most tenosynovitis surgeries are done as outpatient procedures (you go home the same day). Surgery may involve local, regional (whole arm), or general anesthesia.  MEDICATION   If pain medicine is needed, nonsteroidal anti-inflammatory medicines (aspirin and ibuprofen), or other minor pain relievers (acetaminophen), are often advised.  Do not take pain medicine for 7 days before surgery.  Prescription pain relievers are often prescribed only after surgery. Use only as directed and only as much as you need.  Corticosteroid injections may be given if your caregiver thinks they are needed.  There is a limited number of times these injections may be given. COLD THERAPY   Cold treatment (icing) should be applied for 10 to 15 minutes every 2 to 3 hours for inflammation and pain, and immediately after activity that aggravates your symptoms. Use ice packs or an ice massage. SEEK MEDICAL CARE IF:   Symptoms get worse or do not improve in 2 to 4 weeks, despite treatment.  You experience pain, numbness, or coldness in the hand.  Blue, gray, or dark color appears in the fingernails.  Any of the following occur after surgery: increased pain, swelling, redness, drainage of fluids, bleeding in the affected area, or signs of infection.  New, unexplained symptoms develop. (Drugs used in treatment may produce side effects.) Document Released: 03/03/2005 Document Revised: 05/26/2011 Document Reviewed: 06/15/2008 [facility] Patient Information 2015 [place], [place]. This information is not intended to replace advice given to you by your health care provider. Make sure you discuss any questions you have with your health care provider.

## 2013-12-27 NOTE — ED Provider Notes (Signed)
Medical screening examination/treatment/procedure(s) were performed by non-physician practitioner and as supervising physician I was immediately available for consultation/collaboration.   EKG Interpretation None        Fredia Sorrow, MD 12/27/13 1715

## 2013-12-27 NOTE — ED Provider Notes (Signed)
CSN: [id number]     Arrival date & time 12/27/13  1523 History   First MD Initiated Contact with Patient 12/27/13 1626     Chief Complaint  Patient presents with  . Hand Pain     (Consider location/radiation/quality/duration/timing/severity/associated sxs/prior Treatment) HPI Comments: This is a 48 year old female with a past medical history of chronic nerve pain, mental disorder, bipolar disorder, anxiety, depression, substance abuse with fentanyl and Percocet who presents to the emergency department complaining of an exacerbation of her left hand pain x3 days. Patient reports she is on Nucynta for chronic left hand pain and nerve pain prescribed by Dr. Hardin Negus who is her pain management doctor that she has a contract with, and states she ran out of this medication 3 days ago. She tried to call him and he advised her to go to the emergency department as he could not fill the medication. On chart review, she is being treated for tenosynovitis in her left thumb and first finger. Denies any new injury or trauma. States this is her same pain that she always experiences.  Patient is a 48 y.o. female presenting with hand pain. The history is provided by the patient.  Hand Pain    Past Medical History  Diagnosis Date  . Dysrhythmia     Left sided heart diease  . Mental disorder   . Bipolar affective disorder, mixed, in partial or remission   . Headache(784.0)   . Anxiety   . Depression   . 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 History of gallstones   . PONV (postoperative nausea and vomiting)   . Sleep apnea   . Renal disorder   . Kidney stones   . Pneumonia     Hx: of as a child  . Migraine    Past Surgical History  Procedure Laterality Date  . Vaginal hysterectomy    . Cholecystectomy    . Total abdominal hysterectomy w/ bilateral salpingoophorectomy    . Foot surgery      right x 2  . Colonoscopy      2001, 2008.  Father has colon cancer  .  Bunionectomy      11/14/2011  . Abdominal hysterectomy    . 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 Colon cancer Father 52  . Stomach cancer Neg Hx   . Rectal cancer Neg Hx   . Esophageal cancer Neg Hx   . Breast cancer Mother   . Breast cancer Father   . Uterine cancer Mother    History  Substance Use Topics  . Smoking status: Never Smoker   . Smokeless tobacco: Never Used  . Alcohol Use: No   OB History   Grav Para Term Preterm Abortions TAB SAB Ect Mult Living                 Review of Systems  Constitutional: Negative.   HENT: Negative.   Respiratory: Negative.   Cardiovascular: Negative.   Musculoskeletal:       +L hand pain.  Skin: Negative.       Allergies  Review of patient's allergies indicates no known allergies.  Home Medications   Prior to Admission medications   Medication Sig Start Date End  Date Taking? Authorizing Provider  hydrOXYzine (VISTARIL) 25 MG capsule Take 25 mg by mouth at bedtime.    Historical Provider, MD  lithium carbonate (LITHOBID) 300 MG CR tablet Take 300 mg by mouth every evening.     Historical Provider, MD  oxyCODONE-acetaminophen (PERCOCET/ROXICET) 5-325 MG per tablet Take 2 tablets by mouth every 4 (four) hours as needed for severe pain. 11/27/13   Fransico Meadow, PA-C  potassium chloride SA (K-DUR,KLOR-CON) 20 MEQ tablet Take 2 tablets (40 mEq total) by mouth daily. 07/04/13   [name], MD  pregabalin (LYRICA) 100 MG capsule Take 1 capsule (100 mg total) by mouth 2 (two) times daily. 07/04/13   [name], MD  promethazine (PHENERGAN) 25 MG tablet Take 1 tablet (25 mg total) by mouth every 6 (six) hours as needed for nausea or vomiting. 09/14/13   Brunetta Jeans, PA-C  QUEtiapine (SEROQUEL)  400 MG tablet Take 800 mg by mouth at bedtime.     Historical Provider, MD  risperiDONE (RISPERDAL) 1 MG tablet Take 1 mg by mouth at bedtime.    Historical Provider, MD  SUMAtriptan (IMITREX) 25 MG tablet TAKE 1 TABLET BY MOUTH ONCE. MAY REPEAT IN 2 HOURS IF HEADACHE PERSISTS OR RECURS.    Brunetta Jeans, PA-C  Tapentadol HCl (NUCYNTA) 75 MG TABS Take 1 tablet by mouth as directed. Q4-6 HOURS, MAX 5 TABS DAILY    Historical Provider, MD  traMADol (ULTRAM) 50 MG tablet Take 1 tablet (50 mg total) by mouth every 6 (six) hours as needed for moderate pain. 07/04/13   [name], MD  zolpidem (AMBIEN) 10 MG tablet Take 10 mg by mouth at bedtime as needed for sleep. For sleep    Historical Provider, MD   BP 133/79  Pulse 102  Temp(Src) 97.9 F (36.6 C) (Oral)  Resp 20  Ht 5\' 6"  (1.676 m)  Wt 200 lb (90.719 kg)  BMI 32.30 kg/m2  SpO2 97% Physical Exam  Nursing note and vitals reviewed. Constitutional: She is oriented to person, place, and time. She appears well-developed and well-nourished. No distress.  HENT:  Head: Normocephalic and atraumatic.  Mouth/Throat: Oropharynx is clear and moist.  Eyes: Conjunctivae and EOM are normal.  Neck: Normal range of motion. Neck supple.  Cardiovascular: Normal rate, regular rhythm, normal heart sounds and intact distal pulses.   Pulmonary/Chest: Effort normal and breath sounds normal. No respiratory distress.  Musculoskeletal: Normal range of motion. She exhibits no edema.       Hands: Pain exacerbated by left thumb and index finger flexion. Positive Finkelstein's test. No swelling or redness.  Neurological: She is alert and oriented to person, place, and time. No sensory deficit.  Skin: Skin is warm and dry.  Psychiatric: She has a normal mood and affect. Her behavior is normal.    ED Course  Procedures (including critical care time) Labs Review Labs Reviewed - No data to display  Imaging Review No results found.   EKG  Interpretation None      MDM   Final diagnoses:  Chronic hand pain, left   Pt in NAD. AFVSS. No tachycardia on my exam. No new symptoms. On chart review, it is noted pt has a history of fentanyl and percocet abuse. I discussed with pt that I could not give narcotics in the ED to exacerbate her past abuse. Pt agreeable, given tramadol. She has a contract with the pain clinic, and pt understandable that she cannot get a prescription for narcotics at  discharge. F/u with PCP and pain management. Stable for d/c. Return precautions given. Patient states understanding of treatment care plan and is agreeable.    Carman Ching, PA-C 12/27/13 [phone]

## 2013-12-27 NOTE — ED Notes (Signed)
Hand in her left hand. Ran out of pain medication 3 days ago.

## 2014-01-27 ENCOUNTER — Emergency Department: Payer: PRIVATE HEALTH INSURANCE

## 2014-01-27 ENCOUNTER — Emergency Department
Admission: EM | Admit: 2014-01-27 | Discharge: 2014-01-27 | Disposition: A | Discharge status: Other Acute Inpatient Hospital | Payer: PRIVATE HEALTH INSURANCE | Attending: Family Medicine | Admitting: Family Medicine

## 2014-01-27 DIAGNOSIS — R112 Nausea with vomiting, unspecified: Secondary | ICD-10-CM | POA: Insufficient documentation

## 2014-01-27 DIAGNOSIS — W010XXA Fall on same level from slipping, tripping and stumbling without subsequent striking against object, initial encounter: Secondary | ICD-10-CM | POA: Insufficient documentation

## 2014-01-27 DIAGNOSIS — Y92511 Restaurant or cafe as the place of occurrence of the external cause: Secondary | ICD-10-CM | POA: Insufficient documentation

## 2014-01-27 DIAGNOSIS — S0990XA Unspecified injury of head, initial encounter: Secondary | ICD-10-CM | POA: Insufficient documentation

## 2014-01-27 MED ORDER — ONDANSETRON 4 MG PO TBDP
4.0000 mg | ORAL_TABLET | Freq: Once | ORAL | Status: AC
Start: 2014-01-27 — End: 2014-01-27
  Administered 2014-01-27: 4 mg via ORAL
  Filled 2014-01-27: qty 1

## 2014-01-27 NOTE — ED Notes (Signed)
Pt walked in w/ husband c/o pain in upper nose after the fall on a concrete floor, denies LOC, + headache, no breathing problem, no nose bleeding, +n/v, aaox3, ambulatory

## 2014-01-27 NOTE — ED Provider Notes (Signed)
[facility] H&P         CLINICAL SUMMARY          Diagnosis:    .     Final diagnoses:   Head injury, initial encounter   Non-intractable vomiting with nausea, vomiting of unspecified type         MDM Notes:            Disposition:          ED Disposition     Transfer to [facility]                     CLINICAL INFORMATION        HPI:      Chief Complaint: Fall  .    Daisy Burton is a 48 y.o. female who fell while at a restaurant at 1 pm today She tripped fell forward landing on her face on a concrete floor.She denies any loss of consciousness diplopia .She c/o increasing nausea and one episode of vomiting 6 hours after the fall she presents with her husband     History obtained from:patient      ROS:      Review of Systems   Constitutional: Negative for fever, chills, weight loss, malaise/fatigue and diaphoresis.   HENT: Positive for ear pain and tinnitus. Negative for congestion, ear discharge, hearing loss, nosebleeds and sore throat.    Eyes: Negative for blurred vision, double vision, photophobia, pain, discharge and redness.   Respiratory: Negative for cough, hemoptysis, sputum production, shortness of breath, wheezing and stridor.    Cardiovascular: Negative for chest pain, palpitations, orthopnea, claudication, leg swelling and PND.   Gastrointestinal: Positive for nausea and vomiting. Negative for heartburn, diarrhea, constipation and blood in stool.   Genitourinary: Negative.    Musculoskeletal: Negative for back pain, joint pain, falls and neck pain.   Skin: Negative for itching and rash.   Neurological: Positive for dizziness and headaches. Negative for tingling, tremors, sensory change, speech change, focal weakness, seizures, loss of consciousness and weakness.   All other systems reviewed and are negative.         Physical Exam:      Pulse 88  BP 156/89 mmHg  Resp 16  SpO2 98 %  Temp 97 F (36.1 C)    Physical Exam   Constitutional: She  is oriented to person, place, and time. She appears well-developed and well-nourished. No distress.   HENT:   Right Ear: External ear normal.   Left Ear: External ear normal.   Mouth/Throat: Oropharynx is clear and moist.   Tenderness at bridge of nose mild swelling moderate tenderness in midline frontal area    Eyes: Conjunctivae and EOM are normal. Pupils are equal, round, and reactive to light. Right eye exhibits no discharge. Left eye exhibits no discharge. No scleral icterus.   Neck: Normal range of motion. Neck supple. No JVD present. No tracheal deviation present. No thyromegaly present.   Cardiovascular: Normal rate, regular rhythm and intact distal pulses.  Exam reveals no gallop and no friction rub.    No murmur heard.  Pulmonary/Chest: Effort normal and breath sounds normal. Stridor present. No respiratory distress. She has no wheezes. She has no rales. She exhibits no tenderness.   Abdominal: Soft. Bowel sounds are normal. She exhibits no distension and no mass. There is no tenderness. There is no rebound and no guarding.   Musculoskeletal: She exhibits no edema or tenderness.  Lymphadenopathy:     She has no cervical adenopathy.   Neurological: She is alert and oriented to person, place, and time. She has normal reflexes. She displays normal reflexes. No cranial nerve deficit. She exhibits normal muscle tone. Coordination normal.   Skin: Skin is warm and dry. She is not diaphoretic.   Psychiatric: She has a normal mood and affect.   Nursing note and vitals reviewed.                PAST HISTORY        Primary Care Provider: Christa See, MD        PMH/PSH:    .     History reviewed. No pertinent past medical history.    She has past surgical history that includes Hysterectomy and Cholecystectomy.      Social/Family History:      She reports that she has never smoked. She does not have any smokeless tobacco history on file. She reports that she does not drink alcohol. Her drug history is not on  file.    History reviewed. No pertinent family history.      Listed Medications on Arrival:    .     Previous Medications    QUETIAPINE (SEROQUEL) 300 MG TABLET    Take 800 mg by mouth nightly.    RISPERIDONE (RISPERDAL) 4 MG TABLET    Take 4 mg by mouth 2 (two) times daily.    ZOLPIDEM (AMBIEN CR) 12.5 MG CR TABLET    Take 10 mg by mouth nightly as needed for Sleep.      Allergies: She has No Known Allergies.            VISIT INFORMATION        Clinical Course in the ED:    1040pm In view of patient developing nausea and vomiting 6 hours after fall will transfer for head ct scan pt requested [facility] called er discussed with DR Nelson Chimes who accepted pt for transfer        Medications Given in the ED:    .     ED Medication Orders     Start     Status Ordering Provider    01/27/14 2231  ondansetron (ZOFRAN-ODT) disintegrating tablet 4 mg   Once     Route: Oral  Ordered Dose: 4 mg     Last MAR action:  Given Eddie Payette LEE            Procedures:      Procedures      Interpretations:                  RESULTS        Lab Results:      Results     ** No results found for the last 24 hours. **              Radiology Results:      No orders to display            Venia Minks, MD  01/27/14 [phone]

## 2014-02-19 ENCOUNTER — Telehealth: Payer: Self-pay | Admitting: Family Medicine

## 2014-02-19 ENCOUNTER — Ambulatory Visit (HOSPITAL_COMMUNITY): Payer: Commercial Managed Care - HMO | Attending: Emergency Medicine

## 2014-02-19 ENCOUNTER — Emergency Department (HOSPITAL_COMMUNITY)
Admission: EM | Admit: 2014-02-19 | Discharge: 2014-02-19 | Disposition: A | Discharge status: Home / Self Care | Payer: Commercial Managed Care - HMO | Source: Home / Self Care | Attending: Emergency Medicine | Admitting: Emergency Medicine

## 2014-02-19 ENCOUNTER — Encounter (HOSPITAL_COMMUNITY): Payer: Self-pay | Admitting: Emergency Medicine

## 2014-02-19 DIAGNOSIS — R0602 Shortness of breath: Secondary | ICD-10-CM | POA: Diagnosis not present

## 2014-02-19 DIAGNOSIS — R05 Cough: Secondary | ICD-10-CM | POA: Insufficient documentation

## 2014-02-19 DIAGNOSIS — R000 Tachycardia, unspecified: Secondary | ICD-10-CM

## 2014-02-19 DIAGNOSIS — R509 Fever, unspecified: Secondary | ICD-10-CM | POA: Diagnosis not present

## 2014-02-19 DIAGNOSIS — M792 Neuralgia and neuritis, unspecified: Secondary | ICD-10-CM

## 2014-02-19 DIAGNOSIS — I471 Supraventricular tachycardia: Secondary | ICD-10-CM

## 2014-02-19 LAB — POCT URINALYSIS DIP (DEVICE)
Bilirubin Urine: NEGATIVE
GLUCOSE, UA: NEGATIVE mg/dL
Hgb urine dipstick: NEGATIVE
KETONES UR: NEGATIVE mg/dL
Nitrite: NEGATIVE
Protein, ur: NEGATIVE mg/dL
UROBILINOGEN UA: 0.2 mg/dL (ref 0.0–1.0)
pH: 5.5 (ref 5.0–8.0)

## 2014-02-19 LAB — CBC WITH DIFFERENTIAL/PLATELET
BASOS ABS: 0 10*3/uL (ref 0.0–0.1)
Basophils Relative: 0 % (ref 0–1)
EOS ABS: 0 10*3/uL (ref 0.0–0.7)
EOS PCT: 0 % (ref 0–5)
HCT: 37.2 % (ref 36.0–46.0)
Hemoglobin: 12.5 g/dL (ref 12.0–15.0)
LYMPHS PCT: 8 % — AB (ref 12–46)
Lymphs Abs: 0.9 10*3/uL (ref 0.7–4.0)
MCH: 28.3 pg (ref 26.0–34.0)
MCHC: 33.6 g/dL (ref 30.0–36.0)
MCV: 84.4 fL (ref 78.0–100.0)
Monocytes Absolute: 0.6 10*3/uL (ref 0.1–1.0)
Monocytes Relative: 5 % (ref 3–12)
NEUTROS PCT: 87 % — AB (ref 43–77)
Neutro Abs: 9.3 10*3/uL — ABNORMAL HIGH (ref 1.7–7.7)
PLATELETS: 148 10*3/uL — AB (ref 150–400)
RBC: 4.41 MIL/uL (ref 3.87–5.11)
RDW: 13.2 % (ref 11.5–15.5)
WBC: 10.7 10*3/uL — ABNORMAL HIGH (ref 4.0–10.5)

## 2014-02-19 LAB — POCT I-STAT, CHEM 8
BUN: 11 mg/dL (ref 6–23)
Calcium, Ion: 1.21 mmol/L (ref 1.12–1.23)
Chloride: 105 mEq/L (ref 96–112)
Creatinine, Ser: 0.8 mg/dL (ref 0.50–1.10)
Glucose, Bld: 121 mg/dL — ABNORMAL HIGH (ref 70–99)
HEMATOCRIT: 39 % (ref 36.0–46.0)
Hemoglobin: 13.3 g/dL (ref 12.0–15.0)
POTASSIUM: 3.2 meq/L — AB (ref 3.7–5.3)
SODIUM: 142 meq/L (ref 137–147)
TCO2: 22 mmol/L (ref 0–100)

## 2014-02-19 LAB — TSH: TSH: 2.11 u[IU]/mL (ref 0.350–4.500)

## 2014-02-19 MED ORDER — TRAMADOL HCL 50 MG PO TABS: 100.0000 mg | ORAL_TABLET | Freq: Three times a day (TID) | ORAL | Status: DC | PRN

## 2014-02-19 MED ORDER — ACETAMINOPHEN 325 MG PO TABS
650.0000 mg | ORAL_TABLET | Freq: Once | ORAL | Status: AC
  Administered 2014-02-19: 650 mg via ORAL

## 2014-02-19 MED ORDER — ACETAMINOPHEN 325 MG PO TABS
ORAL_TABLET | ORAL | Status: AC
  Filled 2014-02-19: qty 2

## 2014-02-19 MED ORDER — SODIUM CHLORIDE 0.9 % IV SOLN: INTRAVENOUS | Status: DC

## 2014-02-19 NOTE — Discharge Instructions (Signed)

## 2014-02-19 NOTE — Telephone Encounter (Signed)
Thank you for making me aware.  I will forward this to the schedulers so they can contact patient and schedule follow-up.   Colletta [place] -- Please schedule patient for follow-up 02/20/14 please

## 2014-02-19 NOTE — ED Notes (Signed)
Right upper arm, visible bruising

## 2014-02-19 NOTE — ED Notes (Signed)
Patient sent to [facility] for xray

## 2014-02-19 NOTE — ED Provider Notes (Signed)
Chief Complaint   Medication Refill   History of Present Illness   [name] is a 48 year old female who presents today for a prescription for a medication. She has been followed for the past 1-2 years by Dr. Nicholaus Bloom for chronic neuropathic hand pain. She is on Nucynta 75 mg every 4 hours. The pharmacy cannot get this medication, so she needs something for pain.  On presentation here, she had a temperature 100.3 and a pulse of 128. She denies any fever at home or subjective fever. Likewise she denies any sensation of rapid or irregular heartbeat there is no obvious source of the fevers such as nasal congestion, drainage, headache, stiff neck, or sore throat. She's had no coughing or chest pain. No shortness of breath. No GI symptoms. No urinary or GYN complaints. No skin rash or history of a tick bite. Likewise she has no cardiac history, syncope, or presyncope.  Review of Systems     Other than as noted above, the patient denies any of the following symptoms: Systemic:  No fever, chills, fatigue, myalgias, headache, or anorexia. Eye:  No redness, pain or drainage. ENT:  No earache, nasal congestion, rhinorrhea, sinus pressure, or sore throat. Lungs:  No cough, sputum production, wheezing, shortness of breath.  Cardiovascular:  No chest pain, palpitations, or syncope. GI:  No nausea, vomiting, abdominal pain or diarrhea. GU:  No dysuria, frequency, or hematuria. Skin:  No rash or pruritis.   [place]     Past medical history, family history, social history, meds, and allergies were reviewed.  No medication allergies. She is on lithium, Seroquel, Risperdal, Nucynta, Ambien, and Lyrica. She has a history of bipolar disorder.  Physical Examination    Vital signs:  BP 138/77 mmHg  Pulse 128  Temp(Src) 100.3 F (37.9 C) (Oral)  Resp 18  SpO2 95%  Orthostatic VS for the past 24 hrs:  BP- Lying Pulse- Lying BP- Sitting Pulse- Sitting BP- Standing at 0 minutes Pulse- Standing at  0 minutes  02/19/14 1635 130/74 mmHg 116 113/74 mmHg 118 110/74 mmHg 127    General:  Alert, in no distress. Eye:  PERRL, full EOMs.  Lids and conjunctivas were normal. ENT:  TMs and canals were normal, without erythema or inflammation.  Nasal mucosa was clear and uncongested, without drainage.  Mucous membranes were moist.  Pharynx was clear, without exudate or drainage.  There were no oral ulcerations or lesions. Neck:  Supple, no adenopathy, tenderness or mass. Thyroid was normal. Lungs:  No respiratory distress.  Lungs were clear to auscultation, without wheezes, rales or rhonchi.  Breath sounds were clear and equal bilaterally. Heart:  Regular rhythm, without gallops, murmers or rubs. Abdomen:  Soft, flat, and non-tender to palpation.  No hepatosplenomagaly or mass. Extremities: Hands and feet are puffy but there is no pitting edema. No skin lesions. No tenderness to palpation, pulses are full. Skin:  Clear, warm, and dry, without rash or lesions.  EKG Results:  Date: 02/19/2014  Rate: 123  Rhythm: Sinus tachycardia.  QRS Axis: left--  -21   Intervals: normal  ST/T Wave abnormalities: normal  Conduction Disutrbances:none  Narrative Interpretation: Sinus tachycardia, possible left atrial enlargement, incomplete right bundle branch block, otherwise normal.  Old EKG Reviewed: none available      Labs    Results for orders placed or performed during the hospital encounter of 02/19/14  CBC with Differential  Result Value Ref Range   WBC 10.7 (H) 4.0 - 10.5 K/uL  RBC 4.41 3.87 - 5.11 MIL/uL   Hemoglobin 12.5 12.0 - 15.0 g/dL   HCT 37.2 36.0 - 46.0 %   MCV 84.4 78.0 - 100.0 fL   MCH 28.3 26.0 - 34.0 pg   MCHC 33.6 30.0 - 36.0 g/dL   RDW 13.2 11.5 - 15.5 %   Platelets 148 (L) 150 - 400 K/uL   Neutrophils Relative % 87 (H) 43 - 77 %   Neutro Abs 9.3 (H) 1.7 - 7.7 K/uL   Lymphocytes Relative 8 (L) 12 - 46 %   Lymphs Abs 0.9 0.7 - 4.0 K/uL   Monocytes Relative 5 3 - 12 %    Monocytes Absolute 0.6 0.1 - 1.0 K/uL   Eosinophils Relative 0 0 - 5 %   Eosinophils Absolute 0.0 0.0 - 0.7 K/uL   Basophils Relative 0 0 - 1 %   Basophils Absolute 0.0 0.0 - 0.1 K/uL  POCT urinalysis dip (device)  Result Value Ref Range   Glucose, UA NEGATIVE NEGATIVE mg/dL   Bilirubin Urine NEGATIVE NEGATIVE   Ketones, ur NEGATIVE NEGATIVE mg/dL   Specific Gravity, Urine >=1.030 1.005 - 1.030   Hgb urine dipstick NEGATIVE NEGATIVE   pH 5.5 5.0 - 8.0   Protein, ur NEGATIVE NEGATIVE mg/dL   Urobilinogen, UA 0.2 0.0 - 1.0 mg/dL   Nitrite NEGATIVE NEGATIVE   Leukocytes, UA TRACE (A) NEGATIVE  I-STAT, chem 8  Result Value Ref Range   Sodium 142 137 - 147 mEq/L   Potassium 3.2 (L) 3.7 - 5.3 mEq/L   Chloride 105 96 - 112 mEq/L   BUN 11 6 - 23 mg/dL   Creatinine, Ser 0.80 0.50 - 1.10 mg/dL   Glucose, Bld 121 (H) 70 - 99 mg/dL   Calcium, Ion 1.21 1.12 - 1.23 mmol/L   TCO2 22 0 - 100 mmol/L   Hemoglobin 13.3 12.0 - 15.0 g/dL   HCT 39.0 36.0 - 46.0 %     Radiology    Dg Chest 2 View  02/19/2014   CLINICAL DATA:  Fever.  Shortness of breath.  Cough.  EXAM: CHEST  2 VIEW  COMPARISON:  Chest radiograph 07/28/2013  FINDINGS: Stable cardiac and mediastinal contours. No large consolidative pulmonary opacity. No pleural effusion or pneumothorax. Unchanged bilateral lower lung scarring. Mid thoracic spine degenerative change.  IMPRESSION: No acute cardiopulmonary process.   Electronically Signed   By: Lovey Newcomer M.D.   On: 02/19/2014 17:54     Course in Urgent [place]   The following medications were given:  Medications  0.9 %  sodium chloride infusion (not administered)  acetaminophen (TYLENOL) tablet 650 mg (650 mg Oral Given 02/19/14 1629)    One attempt to start an IV was unsuccessful, therefore she was given oral hydration with a liter of Gatorade which she kept down well. Thereafter her pulse was down to 108.  Assessment   The primary encounter diagnosis was Neuropathic  pain. Diagnoses of Fever and Sinus tachycardia were also pertinent to this visit.  Source of fever and tachycardia remain unknown. There is no obvious pneumonia, urinary tract infection, or skin infection. She will need follow-up, and I contacted her primary care physician to let him know about her need for follow-up tomorrow.  Plan     1.  Meds:  The following meds were prescribed:   New Prescriptions   TRAMADOL (ULTRAM) 50 MG TABLET    Take 2 tablets (100 mg total) by mouth every 8 (eight) hours as needed.  2.  Patient Education/Counseling:  The patient was given appropriate handouts, self care instructions, and instructed in symptomatic relief.    3.  Follow up:  The patient was told to follow up here if no better in 3 to 4 days, or sooner if becoming worse in any way, and given some red flag symptoms such as as chest pain, shortness of breath, or dizziness which would prompt immediate return.  Follow up with primary care doctor next week.        Harden Mo, MD 02/19/14 [phone]

## 2014-02-19 NOTE — Telephone Encounter (Signed)
On call note: Received call from Dr. Jake Michaelis at [facility]. He saw pt tonight for pain but was concerned about an unrelated finding of fever. He found no source and all blood tests, urine, and CXR were unremarkable.  He asked that she be followed up in clinic on Monday 02/20/14.

## 2014-02-20 ENCOUNTER — Ambulatory Visit: Payer: Commercial Managed Care - HMO | Admitting: Physician Assistant

## 2014-02-20 ENCOUNTER — Telehealth: Payer: Self-pay | Admitting: *Deleted

## 2014-02-20 LAB — URINE CULTURE

## 2014-02-20 NOTE — Telephone Encounter (Signed)
Appointment scheduled.

## 2014-02-20 NOTE — Telephone Encounter (Signed)
Please call patient to reschedule.  If she is having a fever for no reason, she needs a workup. I even made a slot for her today to be seen for follow-up so if she is still having fever, I highly encourage her to reschedule.

## 2014-02-20 NOTE — Telephone Encounter (Signed)
Pt did not show for appointment 02/20/2014 at 2:00pm for "fever"

## 2014-02-21 NOTE — Telephone Encounter (Signed)
Spoke with pt, she states she is fine.  Said she tried to call to cancel appointment but could not get through.

## 2014-02-28 ENCOUNTER — Institutional Professional Consult (permissible substitution): Payer: Self-pay | Admitting: Neurology

## 2014-02-28 ENCOUNTER — Telehealth: Payer: Self-pay | Admitting: Neurology

## 2014-02-28 NOTE — Telephone Encounter (Signed)
Patient is a no show (02/28/14)for today's appointment at 10:00

## 2014-03-11 ENCOUNTER — Encounter (HOSPITAL_COMMUNITY): Payer: Self-pay

## 2014-03-11 ENCOUNTER — Inpatient Hospital Stay (HOSPITAL_COMMUNITY)
Admission: AD | Admit: 2014-03-11 | Discharge: 2014-03-15 | DRG: 885 | Disposition: A | Discharge status: Home / Self Care | Payer: Commercial Managed Care - HMO | Source: Intra-hospital | Attending: Psychiatry | Admitting: Psychiatry

## 2014-03-11 ENCOUNTER — Emergency Department (HOSPITAL_COMMUNITY)
Admission: EM | Admit: 2014-03-11 | Discharge: 2014-03-11 | Disposition: A | Discharge status: Other Acute Inpatient Hospital | Payer: Commercial Managed Care - HMO | Attending: Emergency Medicine | Admitting: Emergency Medicine

## 2014-03-11 ENCOUNTER — Encounter (HOSPITAL_COMMUNITY): Payer: Self-pay | Admitting: Emergency Medicine

## 2014-03-11 DIAGNOSIS — Z8719 Personal history of other diseases of the digestive system: Secondary | ICD-10-CM | POA: Diagnosis not present

## 2014-03-11 DIAGNOSIS — F314 Bipolar disorder, current episode depressed, severe, without psychotic features: Secondary | ICD-10-CM | POA: Diagnosis present

## 2014-03-11 DIAGNOSIS — R45851 Suicidal ideations: Secondary | ICD-10-CM | POA: Diagnosis present

## 2014-03-11 DIAGNOSIS — Z79899 Other long term (current) drug therapy: Secondary | ICD-10-CM | POA: Diagnosis not present

## 2014-03-11 DIAGNOSIS — T40602A Poisoning by unspecified narcotics, intentional self-harm, initial encounter: Secondary | ICD-10-CM | POA: Diagnosis present

## 2014-03-11 DIAGNOSIS — Y9289 Other specified places as the place of occurrence of the external cause: Secondary | ICD-10-CM | POA: Diagnosis not present

## 2014-03-11 DIAGNOSIS — G4730 Sleep apnea, unspecified: Secondary | ICD-10-CM | POA: Diagnosis present

## 2014-03-11 DIAGNOSIS — Z8701 Personal history of pneumonia (recurrent): Secondary | ICD-10-CM | POA: Insufficient documentation

## 2014-03-11 DIAGNOSIS — Z803 Family history of malignant neoplasm of breast: Secondary | ICD-10-CM | POA: Diagnosis not present

## 2014-03-11 DIAGNOSIS — F1120 Opioid dependence, uncomplicated: Secondary | ICD-10-CM | POA: Diagnosis present

## 2014-03-11 DIAGNOSIS — F3130 Bipolar disorder, current episode depressed, mild or moderate severity, unspecified: Secondary | ICD-10-CM

## 2014-03-11 DIAGNOSIS — Y9389 Activity, other specified: Secondary | ICD-10-CM | POA: Insufficient documentation

## 2014-03-11 DIAGNOSIS — F419 Anxiety disorder, unspecified: Secondary | ICD-10-CM | POA: Diagnosis present

## 2014-03-11 DIAGNOSIS — Z8049 Family history of malignant neoplasm of other genital organs: Secondary | ICD-10-CM | POA: Diagnosis not present

## 2014-03-11 DIAGNOSIS — Z634 Disappearance and death of family member: Secondary | ICD-10-CM | POA: Diagnosis not present

## 2014-03-11 DIAGNOSIS — Z800 Family history of malignant neoplasm of digestive organs: Secondary | ICD-10-CM

## 2014-03-11 DIAGNOSIS — Z87442 Personal history of urinary calculi: Secondary | ICD-10-CM | POA: Insufficient documentation

## 2014-03-11 DIAGNOSIS — Y998 Other external cause status: Secondary | ICD-10-CM | POA: Insufficient documentation

## 2014-03-11 DIAGNOSIS — T402X2A Poisoning by other opioids, intentional self-harm, initial encounter: Secondary | ICD-10-CM | POA: Diagnosis present

## 2014-03-11 DIAGNOSIS — G4700 Insomnia, unspecified: Secondary | ICD-10-CM | POA: Diagnosis present

## 2014-03-11 DIAGNOSIS — G43909 Migraine, unspecified, not intractable, without status migrainosus: Secondary | ICD-10-CM | POA: Diagnosis not present

## 2014-03-11 DIAGNOSIS — Z8679 Personal history of other diseases of the circulatory system: Secondary | ICD-10-CM | POA: Insufficient documentation

## 2014-03-11 DIAGNOSIS — G8929 Other chronic pain: Secondary | ICD-10-CM | POA: Diagnosis present

## 2014-03-11 DIAGNOSIS — Z87448 Personal history of other diseases of urinary system: Secondary | ICD-10-CM | POA: Insufficient documentation

## 2014-03-11 LAB — SALICYLATE LEVEL: Salicylate Lvl: <4 mg/dL (ref 2.8–20.0)

## 2014-03-11 LAB — COMPREHENSIVE METABOLIC PANEL
ALBUMIN: 3.8 g/dL (ref 3.5–5.2)
ALT: 20 U/L (ref 0–35)
AST: 16 U/L (ref 0–37)
Alkaline Phosphatase: 71 U/L (ref 39–117)
Anion gap: 8 (ref 5–15)
BUN: 13 mg/dL (ref 6–23)
CO2: 21 mmol/L (ref 19–32)
CREATININE: 0.72 mg/dL (ref 0.50–1.10)
Calcium: 8.7 mg/dL (ref 8.4–10.5)
Chloride: 107 mEq/L (ref 96–112)
GFR calc Af Amer: >90 mL/min (ref >90)
GFR calc non Af Amer: >90 mL/min (ref >90)
Glucose, Bld: 89 mg/dL (ref 70–99)
Potassium: 3.7 mmol/L (ref 3.5–5.1)
SODIUM: 136 mmol/L (ref 135–145)
Total Bilirubin: 0.6 mg/dL (ref 0.3–1.2)
Total Protein: 6.6 g/dL (ref 6.0–8.3)

## 2014-03-11 LAB — RAPID URINE DRUG SCREEN, HOSP PERFORMED
Amphetamines: NOT DETECTED
Barbiturates: NOT DETECTED
Benzodiazepines: NOT DETECTED
COCAINE: NOT DETECTED
OPIATES: POSITIVE — AB
Tetrahydrocannabinol: NOT DETECTED

## 2014-03-11 LAB — CBC
HCT: 37.8 % (ref 36.0–46.0)
Hemoglobin: 12.8 g/dL (ref 12.0–15.0)
MCH: 29 pg (ref 26.0–34.0)
MCHC: 33.9 g/dL (ref 30.0–36.0)
MCV: 85.5 fL (ref 78.0–100.0)
PLATELETS: 132 10*3/uL — AB (ref 150–400)
RBC: 4.42 MIL/uL (ref 3.87–5.11)
RDW: 13.6 % (ref 11.5–15.5)
WBC: 4 10*3/uL (ref 4.0–10.5)

## 2014-03-11 LAB — ETHANOL

## 2014-03-11 LAB — ACETAMINOPHEN LEVEL: Acetaminophen (Tylenol), Serum: <10 ug/mL — ABNORMAL LOW (ref 10–30)

## 2014-03-11 MED ORDER — ONDANSETRON 4 MG PO TBDP: 4.0000 mg | ORAL_TABLET | Freq: Four times a day (QID) | ORAL | Status: DC | PRN

## 2014-03-11 MED ORDER — METHOCARBAMOL 500 MG PO TABS: 500.0000 mg | ORAL_TABLET | Freq: Three times a day (TID) | ORAL | Status: DC | PRN

## 2014-03-11 MED ORDER — MAGNESIUM HYDROXIDE 400 MG/5ML PO SUSP: 30.0000 mL | Freq: Every day | ORAL | Status: DC | PRN

## 2014-03-11 MED ORDER — LOPERAMIDE HCL 2 MG PO CAPS: 2.0000 mg | ORAL_CAPSULE | ORAL | Status: DC | PRN

## 2014-03-11 MED ORDER — PREGABALIN 50 MG PO CAPS: 100.0000 mg | ORAL_CAPSULE | Freq: Two times a day (BID) | ORAL | Status: DC

## 2014-03-11 MED ORDER — CLONIDINE HCL 0.1 MG PO TABS: 0.1000 mg | ORAL_TABLET | Freq: Four times a day (QID) | ORAL | Status: DC

## 2014-03-11 MED ORDER — HYDROXYZINE HCL 25 MG PO TABS
25.0000 mg | ORAL_TABLET | Freq: Every day | ORAL | Status: DC
  Administered 2014-03-11 – 2014-03-14 (×4): 25 mg via ORAL
  Filled 2014-03-11 (×8): qty 1

## 2014-03-11 MED ORDER — CLONIDINE HCL 0.1 MG PO TABS: 0.1000 mg | ORAL_TABLET | Freq: Every day | ORAL | Status: DC

## 2014-03-11 MED ORDER — CLONIDINE HCL 0.1 MG PO TABS
0.1000 mg | ORAL_TABLET | Freq: Every day | ORAL | Status: DC
  Filled 2014-03-11 (×2): qty 1

## 2014-03-11 MED ORDER — HYDROXYZINE HCL 25 MG PO TABS: 25.0000 mg | ORAL_TABLET | Freq: Every day | ORAL | Status: DC

## 2014-03-11 MED ORDER — ACETAMINOPHEN 325 MG PO TABS: 650.0000 mg | ORAL_TABLET | ORAL | Status: DC | PRN

## 2014-03-11 MED ORDER — PREGABALIN 50 MG PO CAPS
50.0000 mg | ORAL_CAPSULE | Freq: Two times a day (BID) | ORAL | Status: DC
  Administered 2014-03-11 – 2014-03-15 (×8): 50 mg via ORAL
  Filled 2014-03-11 (×8): qty 1

## 2014-03-11 MED ORDER — IBUPROFEN 600 MG PO TABS: 600.0000 mg | ORAL_TABLET | Freq: Three times a day (TID) | ORAL | Status: DC | PRN

## 2014-03-11 MED ORDER — DICYCLOMINE HCL 20 MG PO TABS: 20.0000 mg | ORAL_TABLET | Freq: Four times a day (QID) | ORAL | Status: DC | PRN

## 2014-03-11 MED ORDER — ALUM & MAG HYDROXIDE-SIMETH 200-200-20 MG/5ML PO SUSP: 30.0000 mL | ORAL | Status: DC | PRN

## 2014-03-11 MED ORDER — LORAZEPAM 1 MG PO TABS: 1.0000 mg | ORAL_TABLET | Freq: Three times a day (TID) | ORAL | Status: DC | PRN

## 2014-03-11 MED ORDER — IBUPROFEN 200 MG PO TABS
600.0000 mg | ORAL_TABLET | Freq: Three times a day (TID) | ORAL | Status: DC | PRN
  Administered 2014-03-11: 600 mg via ORAL
  Filled 2014-03-11: qty 3

## 2014-03-11 MED ORDER — HYDROXYZINE HCL 25 MG PO TABS
25.0000 mg | ORAL_TABLET | Freq: Four times a day (QID) | ORAL | Status: DC | PRN
  Administered 2014-03-12 – 2014-03-14 (×4): 25 mg via ORAL
  Filled 2014-03-11 (×3): qty 1

## 2014-03-11 MED ORDER — DICYCLOMINE HCL 20 MG PO TABS
20.0000 mg | ORAL_TABLET | Freq: Four times a day (QID) | ORAL | Status: DC | PRN
Start: 2014-03-11 — End: 2014-03-15

## 2014-03-11 MED ORDER — ACETAMINOPHEN 325 MG PO TABS: 650.0000 mg | ORAL_TABLET | Freq: Four times a day (QID) | ORAL | Status: DC | PRN

## 2014-03-11 MED ORDER — ONDANSETRON 4 MG PO TBDP
4.0000 mg | ORAL_TABLET | Freq: Four times a day (QID) | ORAL | Status: DC | PRN
  Administered 2014-03-11: 4 mg via ORAL
  Filled 2014-03-11: qty 1

## 2014-03-11 MED ORDER — QUETIAPINE FUMARATE 400 MG PO TABS
400.0000 mg | ORAL_TABLET | Freq: Every day | ORAL | Status: DC
  Administered 2014-03-11 – 2014-03-12 (×2): 400 mg via ORAL
  Filled 2014-03-11: qty 1
  Filled 2014-03-11: qty 2
  Filled 2014-03-11 (×2): qty 1

## 2014-03-11 MED ORDER — CLONIDINE HCL 0.1 MG PO TABS
0.1000 mg | ORAL_TABLET | ORAL | Status: AC
  Administered 2014-03-13 – 2014-03-15 (×4): 0.1 mg via ORAL
  Filled 2014-03-11 (×4): qty 1

## 2014-03-11 MED ORDER — HYDROXYZINE HCL 25 MG PO TABS: 25.0000 mg | ORAL_TABLET | Freq: Four times a day (QID) | ORAL | Status: DC | PRN

## 2014-03-11 MED ORDER — QUETIAPINE FUMARATE 100 MG PO TABS: 400.0000 mg | ORAL_TABLET | Freq: Every day | ORAL | Status: DC

## 2014-03-11 MED ORDER — LORAZEPAM 1 MG PO TABS
1.0000 mg | ORAL_TABLET | Freq: Three times a day (TID) | ORAL | Status: DC | PRN
Start: 2014-03-11 — End: 2014-03-15
  Administered 2014-03-11 – 2014-03-13 (×3): 1 mg via ORAL
  Filled 2014-03-11 (×3): qty 1

## 2014-03-11 MED ORDER — NAPROXEN 500 MG PO TABS: 500.0000 mg | ORAL_TABLET | Freq: Two times a day (BID) | ORAL | Status: DC | PRN

## 2014-03-11 MED ORDER — CLONIDINE HCL 0.1 MG PO TABS: 0.1000 mg | ORAL_TABLET | ORAL | Status: DC

## 2014-03-11 MED ORDER — CLONIDINE HCL 0.1 MG PO TABS
0.1000 mg | ORAL_TABLET | Freq: Four times a day (QID) | ORAL | Status: AC
  Administered 2014-03-11 – 2014-03-13 (×8): 0.1 mg via ORAL
  Filled 2014-03-11 (×11): qty 1

## 2014-03-11 MED ORDER — PREGABALIN 50 MG PO CAPS
50.0000 mg | ORAL_CAPSULE | Freq: Two times a day (BID) | ORAL | Status: DC
  Administered 2014-03-11: 50 mg via ORAL
  Filled 2014-03-11: qty 1

## 2014-03-11 NOTE — ED Provider Notes (Signed)
CSN: [id number]     Arrival date & time 03/11/14  0801 History   First MD Initiated Contact with Patient 03/11/14 [phone]     Chief Complaint  Patient presents with  . Drug Overdose     (Consider location/radiation/quality/duration/timing/severity/associated sxs/prior Treatment) Patient is a 48 y.o. female presenting with mental health disorder.  Mental Health Problem Presenting symptoms: depression and suicidal thoughts   Presenting symptoms comment:  Drug overdose Degree of incapacity (severity):  Severe Onset quality:  Gradual Duration:  2 weeks Timing:  Constant Progression:  Worsening Chronicity:  Recurrent Context comment:  Mother passed away about a year ago.  The holidays have been hard.   Treatment compliance:  All of the time Relieved by:  Nothing Worsened by:  Nothing tried Associated symptoms: feelings of worthlessness   Associated symptoms: no abdominal pain and no chest pain     Past Medical History  Diagnosis Date  . Dysrhythmia     Left sided heart diease  . Mental disorder   . Bipolar affective disorder, mixed, in partial or remission   . Headache(784.0)   . Anxiety   . Depression   . 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 History of gallstones   . PONV (postoperative nausea and vomiting)   . Sleep apnea   . Renal disorder   . Kidney stones   . Pneumonia     Hx: of as a child  . Migraine    Past Surgical History  Procedure Laterality Date  . Vaginal hysterectomy    . Cholecystectomy    . Total abdominal hysterectomy w/ bilateral salpingoophorectomy    . Foot surgery      right x 2  . Colonoscopy      2001, 2008.  Father has colon cancer  . Bunionectomy      11/14/2011  . Abdominal hysterectomy    . 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 Colon cancer Father 42  . Stomach cancer Neg Hx   . Rectal cancer Neg Hx   . Esophageal cancer Neg Hx   . Breast cancer Mother   . Breast cancer Father   . Uterine cancer Mother    History  Substance Use Topics  . Smoking status: Never Smoker   . Smokeless tobacco: Never Used  . Alcohol Use: No   OB History    No data available     Review of Systems  Cardiovascular: Negative for chest pain.  Gastrointestinal: Negative for abdominal pain.  Psychiatric/Behavioral: Positive for suicidal ideas.  All other systems reviewed and are negative.     Allergies  Review of patient's allergies indicates no known allergies.  Home Medications   Prior to Admission medications   Medication Sig Start Date End Date Taking? Authorizing Provider  hydrOXYzine (VISTARIL) 25 MG capsule Take 25 mg by mouth at bedtime.   Yes Historical Provider, MD  lithium carbonate (LITHOBID) 300 MG CR tablet Take 300 mg by mouth every evening.    Yes Historical Provider, MD  oxyCODONE-acetaminophen (PERCOCET/ROXICET) 5-325 MG per tablet Take 2 tablets by mouth every 4 (four) hours as needed for severe pain. 11/27/13  Yes Hollace Kinnier Sofia, PA-C  pregabalin (LYRICA) 100 MG capsule Take 1 capsule (  100 mg total) by mouth 2 (two) times daily. 07/04/13  Yes [name], MD  promethazine (PHENERGAN) 25 MG tablet Take 1 tablet (25 mg total) by mouth every 6 (six) hours as needed for nausea or vomiting. 09/14/13  Yes Brunetta Jeans, PA-C  QUEtiapine (SEROQUEL) 400 MG tablet Take 800 mg by mouth at bedtime.    Yes Historical Provider, MD  risperiDONE (RISPERDAL) 1 MG tablet Take 1 mg by mouth at bedtime.   Yes Historical Provider, MD  SUMAtriptan (IMITREX) 25 MG tablet TAKE 1 TABLET BY MOUTH ONCE. MAY REPEAT IN 2 HOURS IF HEADACHE PERSISTS OR RECURS.   Yes Brunetta Jeans, PA-C  Tapentadol HCl (NUCYNTA) 75 MG TABS Take 1 tablet by mouth as directed. Q4-6 HOURS, MAX 5 TABS  DAILY   Yes Historical Provider, MD  traMADol (ULTRAM) 50 MG tablet Take 2 tablets (100 mg total) by mouth every 8 (eight) hours as needed. Patient taking differently: Take 100 mg by mouth every 8 (eight) hours as needed for moderate pain.  02/19/14  Yes Harden Mo, MD  zolpidem (AMBIEN) 10 MG tablet Take 10 mg by mouth at bedtime as needed for sleep. For sleep   Yes Historical Provider, MD  potassium chloride SA (K-DUR,KLOR-CON) 20 MEQ tablet Take 2 tablets (40 mEq total) by mouth daily. Patient not taking: Reported on 03/11/2014 07/04/13   [name], MD   BP 110/72 mmHg  Pulse 113  Temp(Src) 97.7 F (36.5 C) (Oral)  Resp 19  SpO2 96% Physical Exam  Constitutional: She is oriented to person, place, and time. She appears well-developed and well-nourished. No distress.  HENT:  Head: Normocephalic and atraumatic.  Mouth/Throat: Oropharynx is clear and moist.  Eyes: Conjunctivae are normal. Pupils are equal, round, and reactive to light. No scleral icterus.  Neck: Neck supple.  Cardiovascular: Normal rate, regular rhythm, normal heart sounds and intact distal pulses.   No murmur heard. Pulmonary/Chest: Effort normal and breath sounds normal. No stridor. No respiratory distress. She has no rales.  Abdominal: Soft. Bowel sounds are normal. She exhibits no distension. There is no tenderness.  Musculoskeletal: Normal range of motion.  Neurological: She is alert and oriented to person, place, and time.  Skin: Skin is warm and dry. No rash noted.  Psychiatric: She has a normal mood and affect. Her behavior is normal. She expresses suicidal ideation.  Nursing note and vitals reviewed.   ED Course  Procedures (including critical care time) Labs Review Labs Reviewed  ACETAMINOPHEN LEVEL - Abnormal; Notable for the following:    Acetaminophen (Tylenol), Serum <10.0 (*)    All other components within normal limits  CBC - Abnormal; Notable for the following:    Platelets 132 (*)     All other components within normal limits  URINE RAPID DRUG SCREEN (HOSP PERFORMED) - Abnormal; Notable for the following:    Opiates POSITIVE (*)    All other components within normal limits  COMPREHENSIVE METABOLIC PANEL  ETHANOL  SALICYLATE LEVEL    Imaging Review No results found.   EKG Interpretation None      MDM   Final diagnoses:  Bipolar affective disorder, depressed, severe  Uncomplicated opioid dependence  Intentional opiate overdose, initial encounter    48 yo female who presents with depression, SI, and drug overdose.   She reports taking 24 Nucynta 75mg  instant release tabs over an 8 hours period.  She reports taking this "so I could get some sleep."  She also reports taking  approximately 180 of these tablets over 2 weeks.  She does not appear drowsy and her respiratory status is intact.  She will need to be monitored for several hours.  Will also check labs and consult TTS.    After several hours, patient exhibited no signs of opiate overdose. TTS evaluated and admitted.   Artis Delay, MD 03/11/14 [phone]

## 2014-03-11 NOTE — ED Notes (Signed)
Correction to triage note: the ingested drug is Nusynta 75mg  I.R.

## 2014-03-11 NOTE — ED Notes (Signed)
Pt states that she took approx 20 nugenta 75mg  within the last 6 hours.  Pt states that she took them to get some sleep. Pt states that she does have SI but denies HI.

## 2014-03-11 NOTE — Progress Notes (Signed)
D) Pt is admitted to the [facility] unit. Pt took 20 Nugenta to sleep. States her Psychiatric took her off of Trazodone and Pt has not been able to sleep since that medication was stopped. Pt said it has been a long time since she has had a good nights sleep. Pt reports "I didn't try to kill myself, I just wanted to sleep". Pt has poor judgment. States that she has been here before and knows how things work. Pt is cooperative and just wants to get her medications adjusted so that she can sleep. Pt denies SI and HI delusions and hallucinations  A) given support, reassurance and praise. Oriented to the unit. Given options for food and fluids. Provided with a 1:1. Encouraged to let the staff know on nights whether or not she is sleeping . R) Pt resting in her room.

## 2014-03-11 NOTE — BH Assessment (Signed)
Assessment Note  [name] is an 48 y.o. female. Patient was brought into the ED by husband because of intentional overdose of 20 Nusynta 75mg .  Patient continues to endorse SI with plan to overdose and history of 6 attempts. Patient reports current depressive symptoms including decreased sleep(only 2-3 hours night), fatigue, decreased motivation, decreased eating habits, decreased hygiene, hopelessness, and symptoms has increased in the past month.  Patient reports he mother died last year so the holidays are really hard on her emotionally.  She reports participating with [facility] and last seen on 12/23.  Patient denies HI, hallucination, and other self-injurious behaviors.   CSW consulted with Dr. Parke Poisson it is recommended to refer for inpatient treatment for safety and stabilization.    Axis I: Major Depression, Recurrent severe Axis II: Deferred Axis III:  Past Medical History  Diagnosis Date  . Dysrhythmia     Left sided heart diease  . Mental disorder   . Bipolar affective disorder, mixed, in partial or remission   . Headache(784.0)   . Anxiety   . Depression   . 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 History of gallstones   . PONV (postoperative nausea and vomiting)   . Sleep apnea   . Renal disorder   . Kidney stones   . Pneumonia     Hx: of as a child  . Migraine    Axis IV: other psychosocial or environmental problems, problems related to social environment and problems with primary support group Axis V: 41-50 serious symptoms  Past Medical History:  Past Medical History  Diagnosis Date  . Dysrhythmia     Left sided heart diease  . Mental disorder   . Bipolar affective disorder, mixed, in partial or remission   . Headache(784.0)   . Anxiety   . Depression   . 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 History of gallstones   . PONV (postoperative nausea and vomiting)   . Sleep apnea   . Renal disorder   .  Kidney stones   . Pneumonia     Hx: of as a child  . Migraine     Past Surgical History  Procedure Laterality Date  . Vaginal hysterectomy    . Cholecystectomy    . Total abdominal hysterectomy w/ bilateral salpingoophorectomy    . Foot surgery      right x 2  . Colonoscopy      2001, 2008.  Father has colon cancer  . Bunionectomy      11/14/2011  . Abdominal hysterectomy    . 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 Colon cancer Father 32  . Stomach cancer Neg Hx   . Rectal cancer Neg Hx   . Esophageal cancer Neg Hx   . Breast cancer Mother   . Breast cancer Father   . Uterine cancer Mother     Social History:  reports that she has never smoked. She has never used smokeless tobacco. She reports that she does not drink alcohol or use illicit drugs.  Additional Social History:     CIWA: CIWA-Ar BP: 110/72 mmHg Pulse Rate: 113 COWS:    Allergies: No  Known Allergies  Home Medications:  (Not in a hospital admission)  OB/GYN Status:  No LMP recorded. Patient has had a hysterectomy.  General Assessment Data Location of Assessment: [facility] ED ACT Assessment: Yes Is this a Tele or Face-to-Face Assessment?: Face-to-Face Is this an Initial Assessment or a Re-assessment for this encounter?: Initial Assessment Living Arrangements: Spouse/significant other, Children Can pt return to current living arrangement?: Yes Admission Status: Voluntary Is patient capable of signing voluntary admission?: Yes Transfer from: Home Referral Source: Self/Family/Friend     [place] Living Arrangements: Spouse/significant other, Children Name of Psychiatrist: [facility]     Risk to self with the past 6 months Suicidal  Ideation: Yes-Currently Present Suicidal Intent: Yes-Currently Present Is patient at risk for suicide?: Yes Suicidal Plan?: Yes-Currently Present Specify Current Suicidal Plan: overdose Access to Means: Yes Specify Access to Suicidal Means: overdose What has been your use of drugs/alcohol within the last 12 months?: none Previous Attempts/Gestures: Yes How many times?: 6 Triggers for Past Attempts: Family contact, Anniversary Intentional Self Injurious Behavior: None Family Suicide History: No Recent stressful life event(s): Loss (Comment), Conflict (Comment) Persecutory voices/beliefs?: No Depression: Yes Depression Symptoms: Despondent, Fatigue, Loss of interest in usual pleasures, Feeling worthless/self pity, Feeling angry/irritable (hopeless) Substance abuse history and/or treatment for substance abuse?: Yes  Risk to Others within the past 6 months Homicidal Ideation: No-Not Currently/Within Last 6 Months Thoughts of Harm to Others: No-Not Currently Present/Within Last 6 Months Current Homicidal Intent: No-Not Currently/Within Last 6 Months Current Homicidal Plan: No-Not Currently/Within Last 6 Months Access to Homicidal Means: No History of harm to others?: No Assessment of Violence: None Noted Does patient have access to weapons?: No Criminal Charges Pending?: No Does patient have a court date: No  Psychosis Hallucinations: None noted Delusions: None noted  Mental Status Report Appear/Hygiene: Poor hygiene Eye Contact: Fair Motor Activity: Unremarkable Speech: Slurred Level of Consciousness: Drowsy Mood: Depressed Affect: Flat Anxiety Level: Minimal Thought Processes: Coherent Judgement: Impaired Orientation: Person, Place, Time, Situation Obsessive Compulsive Thoughts/Behaviors: None  Cognitive Functioning Concentration: Normal Memory: Recent Intact, Remote Intact IQ: Average Insight: Fair Impulse Control: Fair Appetite: Poor Sleep: Decreased Total  Hours of Sleep: 3 Vegetative Symptoms: None  ADLScreening [facility]) Patient's cognitive ability adequate to safely complete daily activities?: Yes Patient able to express need for assistance with ADLs?: Yes Independently performs ADLs?: Yes (appropriate for developmental age)  Prior Inpatient Therapy Prior Inpatient Therapy: Yes Prior Therapy Dates: 2012, 2015 Prior Therapy Facilty/Provider(s): [place], [place], [facility] Reason for Treatment: SI, Depress  Prior Outpatient Therapy Prior Outpatient Therapy: Yes Prior Therapy Dates: 2015 Prior Therapy Facilty/Provider(s): [facility] Reason for Treatment: Depression  ADL Screening (condition at time of admission) Patient's cognitive ability adequate to safely complete daily activities?: Yes Patient able to express need for assistance with ADLs?: Yes Independently performs ADLs?: Yes (appropriate for developmental age)             Regulatory affairs officer (For Healthcare) Does patient have an advance directive?: No Would patient like information on creating an advanced directive?: No - patient declined information    Additional Information 1:1 In Past 12 Months?: No CIRT Risk: No Elopement Risk: No Does patient have medical clearance?: Yes     Disposition:  Disposition Initial Assessment Completed for this Encounter: Yes Disposition of Patient: Inpatient treatment program Type of inpatient treatment program: Adult  On Site Evaluation by:   Reviewed with Physician:    Chesley Noon A 03/11/2014 9:56 AM

## 2014-03-11 NOTE — ED Notes (Signed)
Pt changed into scrubs security in to search pt. pts husband is taking belonging home with him per pt

## 2014-03-11 NOTE — BH Assessment (Signed)
[facility] ED TTS informed of pending consult for Patient.

## 2014-03-11 NOTE — ED Notes (Signed)
Pt aware of need for urine specimen. 

## 2014-03-11 NOTE — Consult Note (Addendum)
[facility] Consult   Reason for Consult:  Depression, requesting opiate detoxification Referring Physician:  ED Physician [name] is an 48 y.o. female. Total Time spent with patient: 30 minutes  Assessment: AXIS I:  Bipolar Disorder Depressed, Opiate Dependence  AXIS II:  Deferred AXIS III:   Past Medical History  Diagnosis Date  . Dysrhythmia     Left sided heart diease  . Mental disorder   . Bipolar affective disorder, mixed, in partial or remission   . Headache(784.0)   . Anxiety   . Depression   . Substance abuse     Fentanyl, Percocet. last use 2011-06-06  . History of gallstones   . PONV (postoperative nausea and vomiting)   . Sleep apnea   . Renal disorder   . Kidney stones   . Pneumonia     Hx: of as a child  . Migraine    AXIS IV:  decreased level of functioning , lack of daily structure, chronic pain AXIS V:  41-50 serious symptoms  Plan:  Recommend psychiatric Inpatient admission when medically cleared.  Subjective:   [name] is a 48 y.o. female patient admitted with  Depression and request for opiate detoxification.  HPI:   Patient is a 48 year old female. Husband at bedside during session, at her request. She states she took about 20 tablets of her prescribed analgesic ( Nucynta)  Over a period of several hours. She states " I was not trying to kill myself, just to be able to sleep" but admits that " I didn't care if I died". Nucynta is prescribed for chronic pain. States she has chronic pain of her hand and feet.  States she has been diagnosed with Bipolar Disorder in the past. Describes chronic depression , particularly after her mother's death in late 2012/06/05. Her mood has been worsening over the last several days to weeks. Reports neuro-vegetative symptoms of depression, to include sadness, poor sleep, poor appetite, anhedonia, low energy level, passive thoughts of death. Denies psychotic symptoms, denies any current manic or  hypomanic symptoms. She denies any current suicidal ideations. Patient states she is aware she has become addicted to opiates. She states she often takes more than prescribed, has obtained opiates from different sources. She states she realizes opiates are contributing to her depression. Makes statements such as " I just sit there all day, I don't do much", and she feels opiates have contributed to this. States she has been detoxed off opiates in the past and felt better off them. She is requesting detoxification from opiates    HPI Elements:  Chronic Mood Disorder, worsened depression over the last year following death of mother. Opiate Dependence. Opiates prescribed for chronic pain. Recent overdose.  Past Psychiatric History:- patient states she has been diagnosed with Bipolar Disorder and describes brief periods of increased irritability, poor sleep, racing thoughts. Mostly describes depression and states she has been depressed for " a long time". History of prior medication overdoses. Denies history of psychosis. States she is managed with Seroquel and Risperidone and that these medications are well tolerated and helpful. Of note, lithium is included in Prior To Admission List, but patient states she was only taking Seroquel, Risperidone. Follows with Dr. Jacques Earthly at [facility]. Denies alcohol or other drug abuse . Past Medical History  Diagnosis Date  . Dysrhythmia     Left sided heart diease  . Mental disorder   . Bipolar affective disorder, mixed, in partial or  remission   . Headache(784.0)   . Anxiety   . Depression   . 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 History of gallstones   . PONV (postoperative nausea and vomiting)   . Sleep apnea   . Renal disorder   . Kidney stones   . Pneumonia     Hx: of as a child  . Migraine     reports that she has never smoked. She has never used smokeless tobacco. She reports that she does not drink alcohol or  use illicit drugs. Family History  Problem Relation Age of Onset  . Colon cancer Father 20  . Stomach cancer Neg Hx   . Rectal cancer Neg Hx   . Esophageal cancer Neg Hx   . Breast cancer Mother   . Breast cancer Father   . Uterine cancer Mother    Family History Family Supports: Yes, List: Living Arrangements: Spouse/significant other, Children Can pt return to current living arrangement?: Yes   Allergies:  No Known Allergies  Objective: Blood pressure 114/69, pulse 76, temperature 97.6 F (36.4 C), temperature source Oral, resp. rate 16, SpO2 95 %.There is no weight on file to calculate BMI. Results for orders placed or performed during the hospital encounter of 03/11/14 (from the past 72 hour(s))  Acetaminophen level     Status: Abnormal   Collection Time: 03/11/14  9:11 AM  Result Value Ref Range   Acetaminophen (Tylenol), Serum <10.0 (L) 10 - 30 ug/mL    Comment:        THERAPEUTIC CONCENTRATIONS VARY SIGNIFICANTLY. A RANGE OF 10-30 ug/mL MAY BE AN EFFECTIVE CONCENTRATION FOR MANY PATIENTS. HOWEVER, SOME ARE BEST TREATED AT CONCENTRATIONS OUTSIDE THIS RANGE. ACETAMINOPHEN CONCENTRATIONS >150 ug/mL AT 4 HOURS AFTER INGESTION AND >50 ug/mL AT 12 HOURS AFTER INGESTION ARE OFTEN ASSOCIATED WITH TOXIC REACTIONS.   CBC     Status: Abnormal   Collection Time: 03/11/14  9:11 AM  Result Value Ref Range   WBC 4.0 4.0 - 10.5 K/uL   RBC 4.42 3.87 - 5.11 MIL/uL   Hemoglobin 12.8 12.0 - 15.0 g/dL   HCT 37.8 36.0 - 46.0 %   MCV 85.5 78.0 - 100.0 fL   MCH 29.0 26.0 - 34.0 pg   MCHC 33.9 30.0 - 36.0 g/dL   RDW 13.6 11.5 - 15.5 %   Platelets 132 (L) 150 - 400 K/uL  Comprehensive metabolic panel     Status: None   Collection Time: 03/11/14  9:11 AM  Result Value Ref Range   Sodium 136 135 - 145 mmol/L    Comment: Please note change in reference range. DELTA CHECK NOTED QUANTITY NOT SUFFICIENT TO REPEAT TEST    Potassium 3.7 3.5 - 5.1 mmol/L    Comment: Please note  change in reference range.   Chloride 107 96 - 112 mEq/L   CO2 21 19 - 32 mmol/L   Glucose, Bld 89 70 - 99 mg/dL   BUN 13 6 - 23 mg/dL   Creatinine, Ser 0.72 0.50 - 1.10 mg/dL   Calcium 8.7 8.4 - 10.5 mg/dL   Total Protein 6.6 6.0 - 8.3 g/dL   Albumin 3.8 3.5 - 5.2 g/dL   AST 16 0 - 37 U/L   ALT 20 0 - 35 U/L   Alkaline Phosphatase 71 39 - 117 U/L   Total Bilirubin 0.6 0.3 - 1.2 mg/dL   GFR calc non Af Amer >90 >90 mL/min   GFR  calc Af Amer >90 >90 mL/min    Comment: (NOTE) The eGFR has been calculated using the CKD EPI equation. This calculation has not been validated in all clinical situations. eGFR's persistently <90 mL/min signify possible Chronic Kidney Disease.    Anion gap 8 5 - 15  Ethanol (ETOH)     Status: None   Collection Time: 03/11/14  9:11 AM  Result Value Ref Range   Alcohol, Ethyl (B) <5 0 - 9 mg/dL    Comment:        LOWEST DETECTABLE LIMIT FOR SERUM ALCOHOL IS 11 mg/dL FOR MEDICAL PURPOSES ONLY   Salicylate level     Status: None   Collection Time: 03/11/14  9:11 AM  Result Value Ref Range   Salicylate Lvl <8.9 2.8 - 20.0 mg/dL  Urine Drug Screen     Status: Abnormal   Collection Time: 03/11/14 10:21 AM  Result Value Ref Range   Opiates POSITIVE (A) NONE DETECTED   Cocaine NONE DETECTED NONE DETECTED   Benzodiazepines NONE DETECTED NONE DETECTED   Amphetamines NONE DETECTED NONE DETECTED   Tetrahydrocannabinol NONE DETECTED NONE DETECTED   Barbiturates NONE DETECTED NONE DETECTED    Comment:        DRUG SCREEN FOR MEDICAL PURPOSES ONLY.  IF CONFIRMATION IS NEEDED FOR ANY PURPOSE, NOTIFY LAB WITHIN 5 DAYS.        LOWEST DETECTABLE LIMITS FOR URINE DRUG SCREEN Drug Class       Cutoff (ng/mL) Amphetamine      1000 Barbiturate      200 Benzodiazepine   211 Tricyclics       941 Opiates          300 Cocaine          300 THC              50    Labs are reviewed and are pertinent for mild thrombocytopenia, UDS positive for opiates .  Current  Facility-Administered Medications  Medication Dose Route Frequency Provider Last Rate Last Dose  . acetaminophen (TYLENOL) tablet 650 mg  650 mg Oral Q4H PRN Artis Delay, MD      . hydrOXYzine (VISTARIL) capsule 25 mg  25 mg Oral QHS Artis Delay, MD      . ibuprofen (ADVIL,MOTRIN) tablet 600 mg  600 mg Oral Q8H PRN Artis Delay, MD      . LORazepam (ATIVAN) tablet 1 mg  1 mg Oral Q8H PRN Artis Delay, MD      . pregabalin (LYRICA) capsule 100 mg  100 mg Oral BID Artis Delay, MD       Current Outpatient Prescriptions  Medication Sig Dispense Refill  . hydrOXYzine (VISTARIL) 25 MG capsule Take 25 mg by mouth at bedtime.    Marland Kitchen lithium carbonate (LITHOBID) 300 MG CR tablet Take 300 mg by mouth every evening.     Marland Kitchen oxyCODONE-acetaminophen (PERCOCET/ROXICET) 5-325 MG per tablet Take 2 tablets by mouth every 4 (four) hours as needed for severe pain. 10 tablet 0  . pregabalin (LYRICA) 100 MG capsule Take 1 capsule (100 mg total) by mouth 2 (two) times daily. 60 capsule 0  . promethazine (PHENERGAN) 25 MG tablet Take 1 tablet (25 mg total) by mouth every 6 (six) hours as needed for nausea or vomiting. 30 tablet 0  . QUEtiapine (SEROQUEL) 400 MG tablet Take 800 mg by mouth at bedtime.     . risperiDONE (RISPERDAL) 1 MG tablet Take 1 mg by mouth at bedtime.    Marland Kitchen  SUMAtriptan (IMITREX) 25 MG tablet TAKE 1 TABLET BY MOUTH ONCE. MAY REPEAT IN 2 HOURS IF HEADACHE PERSISTS OR RECURS. 10 tablet 0  . Tapentadol HCl (NUCYNTA) 75 MG TABS Take 1 tablet by mouth as directed. Q4-6 HOURS, MAX 5 TABS DAILY    . traMADol (ULTRAM) 50 MG tablet Take 2 tablets (100 mg total) by mouth every 8 (eight) hours as needed. (Patient taking differently: Take 100 mg by mouth every 8 (eight) hours as needed for moderate pain. ) 24 tablet 0  . zolpidem (AMBIEN) 10 MG tablet Take 10 mg by mouth at bedtime as needed for sleep. For sleep    . potassium chloride SA (K-DUR,KLOR-CON) 20 MEQ tablet Take 2 tablets (40 mEq total) by mouth  daily. (Patient not taking: Reported on 03/11/2014) 20 tablet 0    Psychiatric Specialty Exam:     Blood pressure 114/69, pulse 76, temperature 97.6 F (36.4 C), temperature source Oral, resp. rate 16, SpO2 95 %.There is no weight on file to calculate BMI.  General Appearance: Fairly Groomed  Engineer, water::  Good  Speech:  Normal Rate  Volume:  Normal  Mood:  Depressed  Affect:  Constricted and anxious  Thought Process:  Linear  Orientation:  Full (Time, Place, and Person)  Thought Content:  denies hallucinations, no delusions  Suicidal Thoughts:  Yes.  without intent/plan- at this time denies any thoughts of hurting self or anyone else   Homicidal Thoughts:  No  Memory:  recent and remote grossly intact   Judgement:  Fair  Insight:  Present  Psychomotor Activity:  Normal  Concentration:  Good  Recall:  Good  Fund of Knowledge:Good  Language: Good  Akathisia:  Negative  Handed:  Right  AIMS (if indicated):     Assets:  Desire for Improvement Resilience Social Support  Sleep:      Musculoskeletal: Strength & Muscle Tone: within normal limits Gait & Station: gait not examined  Patient leans: N/A  Treatment Plan Summary: 1. Would recommend inpatient psychiatric admission for management of depression and opiate detoxification/ patient agrees and states she will sign in to inpatient unit voluntarily. 2. Would D/C Nucynta  And other opiates. Start Opiate Detoxification Protocol with Clonidine and symptomatic relief PRN medications. 3. Would restart Seroquel at 400 mgrs QHS initially 4. Would not restart Ambien or Risperidone at this time.   COBOS, Felicita Gage 03/11/2014 11:32 AM

## 2014-03-11 NOTE — ED Notes (Signed)
She is a bit pensive and in no distress.  She states she is "sad" and tells me she has chronic pain of left arm and bilt. Feet.  An adult female visits with her.

## 2014-03-11 NOTE — ED Notes (Signed)
Pt reminded of need for urine 

## 2014-03-12 DIAGNOSIS — F3130 Bipolar disorder, current episode depressed, mild or moderate severity, unspecified: Secondary | ICD-10-CM

## 2014-03-12 DIAGNOSIS — F1120 Opioid dependence, uncomplicated: Secondary | ICD-10-CM

## 2014-03-12 NOTE — Progress Notes (Signed)
Patient did attend the evening speaker AA meeting.  

## 2014-03-12 NOTE — Plan of Care (Signed)
Problem: Diagnosis: Increased Risk For Suicide Attempt Goal: STG-Patient Will Attend All Groups On The Unit Outcome: Progressing Pt attended evening group on 03/12/14.  She also reported that she attended all groups during the day on 03/12/14.

## 2014-03-12 NOTE — Progress Notes (Signed)
D: Pt has anxious affect and mood.  Pt reports she is here to detox off of "pain pills."  Pt denies SI/HI, denies hallucinations, denies pain.  Pt is visible in milieu.  She interacts with staff appropriately.  Minimal interactions with peers.   A: Medications administered per order.  PRN medication administered for anxiety, see flowsheet.  Safety maintained.  Met with pt 1:1 and offered support and encouragement. R: Pt is compliant with medications.  She verbally contracts for safety.  She reported that she will notify staff of needs and concerns.  Will continue to monitor and assess for safety.

## 2014-03-12 NOTE — Progress Notes (Signed)
D) Pt has been attending the program, listens and pays attention but does not volunteer input. Rates her depression at a 7, hopelessness at a 2 and her anxiety at a 5. States she slept last night which was good. States that she is feeling well detoxing off of her medications and is pleased with herself. Feels that the doctors and nurses are listening to her and she is feeling pleased with that. Denies SI and HI. A) Given support, reassurance and praise. Provided with a 1:1. Encouragement given R) Denies SI and HI

## 2014-03-12 NOTE — H&P (Signed)
Psychiatric Admission Assessment Adult  Patient Identification:  IMA HAFNER Date of Evaluation:  03/12/2014 Chief Complaint:  "I was not sleeping well at night and I started getting more depressed."  History of Present Illness::   [name] is an 48 y.o. female. Patient was brought into the [facility] by husband because of intentional overdose of 20 Nu[place] 26m. Patient continues to endorse SI with plan to overdose and history of 6 attempts. Patient reports current depressive symptoms including decreased sleep(only 2-3 hours night), fatigue, decreased motivation, decreased eating habits, decreased hygiene, hopelessness, and symptoms has increased in the past month. Patient reports her mother died last year so the holidays are really hard on her emotionally. She reports participating with [facility] and last seen on 12/23. Patient denies HI, hallucination, and other self-injurious behaviors. Patient states during psychiatric assessment "I have had a hard time dealing with my mother dying last November. I have only been sleeping about two hours per night. My thoughts are still racing. I slept a little better last night. I still feel depressed. I have not had any manic symptoms recently. I never experience psychosis. I have never tried to hurt myself before" Notes in epic report that the patient has had six prior suicide attempts in the past. LAiyanahwas not forthcoming during assessment and appeared to be quite guarded. She describes a "tingling" pain in her hands and feet but can not offer a specific diagnosis that would indicate treatment with opiates.   Elements:  [facility]  [facility]. Quality:  Depressive symptoms, poor sleep, suicide attempt. Severity:  Severe. Timing:  Last few weeks. Duration:  Chronic. Context:  grieving death of mother last year, worsening mood instability. Associated Signs/Synptoms: Depression Symptoms:  depressed  mood, anhedonia, insomnia, psychomotor retardation, feelings of worthlessness/guilt, hopelessness, impaired memory, suicidal thoughts with specific plan, suicidal attempt, anxiety, loss of energy/fatigue, disturbed sleep, decreased appetite, (Hypo) Manic Symptoms:  Denies recently but has had earlier this year Anxiety Symptoms:  Excessive Worry, Psychotic Symptoms:  Denies PTSD Symptoms: Negative Total Time spent with patient: 1 hour  Psychiatric Specialty Exam: Physical Exam  Constitutional:  [facility] with no noted exceptions.     ROS  Blood pressure 124/79, pulse 112, temperature 98 F (36.7 C), temperature source Oral, resp. rate 20, height 5' 5"  (1.651 m), weight 92.987 kg (205 lb), SpO2 100 %.Body mass index is 34.11 kg/(m^2).  General Appearance: Casual  Eye Contact::  Good  Speech:  Clear and Coherent and Normal Rate  Volume:  Normal  Mood:  Depressed  Affect:  Congruent  Thought Process:  Goal Directed and Linear  Orientation:  Full (Time, Place, and Person)  Thought Content:  Negative  Suicidal Thoughts:  No  Homicidal Thoughts:  No  Memory:  Immediate;   Fair Recent;   Fair Remote;   Fair  Judgement:  Fair  Insight:  Fair  Psychomotor Activity:  Normal  Concentration:  Fair  Recall:  Good  Fund of [place] Language: Fair  Akathisia:  No  Handed:  Right  AIMS (if indicated):     Assets:  Communication Skills Desire for Improvement Housing Social Support  Sleep:  Number of Hours: 6   Musculoskeletal: Strength & Muscle Tone: within normal limits Gait & Station: normal Patient leans: N/A  Past Psychiatric History: yes Diagnosis: Bipolar 1  Hospitalizations: [facility] 2012  Outpatient Care: [facility]  Substance Abuse Care: Denies  Self-Mutilation: Denies  Suicidal Attempts: Denies  Violent Behaviors: Denies   Past Medical History:   Past Medical History  Diagnosis Date  .  Dysrhythmia     Left sided heart diease  . Mental disorder   . Bipolar affective disorder, mixed, in partial or remission   . Headache(784.0)   . Anxiety   . Depression   . 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 History of gallstones   . PONV (postoperative nausea and vomiting)   . Sleep apnea   . Renal disorder   . Kidney stones   . Pneumonia     Hx: of as a child  . Migraine    None. Allergies:  No Known Allergies PTA Medications: Prescriptions prior to admission  Medication Sig Dispense Refill Last Dose  . hydrOXYzine (VISTARIL) 25 MG capsule Take 25 mg by mouth at bedtime.   Past Week at Unknown time  . lithium carbonate (LITHOBID) 300 MG CR tablet Take 300 mg by mouth every evening.    03/10/2014 at Unknown time  . potassium chloride SA (K-DUR,KLOR-CON) 20 MEQ tablet Take 2 tablets (40 mEq total) by mouth daily. 20 tablet 0 03/11/2014 at Unknown time  . SUMAtriptan (IMITREX) 25 MG tablet TAKE 1 TABLET BY MOUTH ONCE. MAY REPEAT IN 2 HOURS IF HEADACHE PERSISTS OR RECURS. 10 tablet 0 03/11/2014 at Unknown time    Previous Psychotropic Medications:  Medication/Dose  Cymbalta, Celexa, Lithium, Risperdal  Lexapro, Prozac, Ambien             Substance Abuse History in the last 12 months:  No.  Consequences of Substance Abuse: NA  Social History:  reports that she has never smoked. She has never used smokeless tobacco. She reports that she does not drink alcohol or use illicit drugs. Additional Social History: History of alcohol / drug use?: No history of alcohol / drug abuse                    Current Place of Residence:   Place of Birth:   Family Members: Marital Status:  Married Children:4  Sons:2  Daughters:2 Relationships: Education:  "Some college."  Educational Problems/Performance: Religious Beliefs/Practices: History of Abuse (Emotional/Phsycial/Sexual)-Denies Ship broker History:  None. Legal  History: Denies Hobbies/Interests:  Family History:   Family History  Problem Relation Age of Onset  . Colon cancer Father 31  . Stomach cancer Neg Hx   . Rectal cancer Neg Hx   . Esophageal cancer Neg Hx   . Breast cancer Mother   . Breast cancer Father   . Uterine cancer Mother     Results for orders placed or performed during the hospital encounter of 03/11/14 (from the past 72 hour(s))  Acetaminophen level     Status: Abnormal   Collection Time: 03/11/14  9:11 AM  Result Value Ref Range   Acetaminophen (Tylenol), Serum <10.0 (L) 10 - 30 ug/mL    Comment:        THERAPEUTIC CONCENTRATIONS VARY SIGNIFICANTLY. A RANGE OF 10-30 ug/mL MAY BE AN EFFECTIVE CONCENTRATION FOR MANY PATIENTS. HOWEVER, SOME ARE BEST TREATED AT CONCENTRATIONS OUTSIDE THIS RANGE. ACETAMINOPHEN CONCENTRATIONS >150 ug/mL AT 4 HOURS AFTER INGESTION AND >50 ug/mL AT 12 HOURS AFTER INGESTION ARE OFTEN ASSOCIATED WITH TOXIC REACTIONS.   CBC     Status: Abnormal   Collection Time: 03/11/14  9:11 AM  Result Value Ref Range   WBC 4.0 4.0 - 10.5 K/uL   RBC 4.42 3.87 -  5.11 MIL/uL   Hemoglobin 12.8 12.0 - 15.0 g/dL   HCT 37.8 36.0 - 46.0 %   MCV 85.5 78.0 - 100.0 fL   MCH 29.0 26.0 - 34.0 pg   MCHC 33.9 30.0 - 36.0 g/dL   RDW 13.6 11.5 - 15.5 %   Platelets 132 (L) 150 - 400 K/uL  Comprehensive metabolic panel     Status: None   Collection Time: 03/11/14  9:11 AM  Result Value Ref Range   Sodium 136 135 - 145 mmol/L    Comment: Please note change in reference range. DELTA CHECK NOTED QUANTITY NOT SUFFICIENT TO REPEAT TEST    Potassium 3.7 3.5 - 5.1 mmol/L    Comment: Please note change in reference range.   Chloride 107 96 - 112 mEq/L   CO2 21 19 - 32 mmol/L   Glucose, Bld 89 70 - 99 mg/dL   BUN 13 6 - 23 mg/dL   Creatinine, Ser 0.72 0.50 - 1.10 mg/dL   Calcium 8.7 8.4 - 10.5 mg/dL   Total Protein 6.6 6.0 - 8.3 g/dL   Albumin 3.8 3.5 - 5.2 g/dL   AST 16 0 - 37 U/L   ALT 20 0 - 35 U/L    Alkaline Phosphatase 71 39 - 117 U/L   Total Bilirubin 0.6 0.3 - 1.2 mg/dL   GFR calc non Af Amer >90 >90 mL/min   GFR calc Af Amer >90 >90 mL/min    Comment: (NOTE) The eGFR has been calculated using the CKD EPI equation. This calculation has not been validated in all clinical situations. eGFR's persistently <90 mL/min signify possible Chronic Kidney Disease.    Anion gap 8 5 - 15  Ethanol (ETOH)     Status: None   Collection Time: 03/11/14  9:11 AM  Result Value Ref Range   Alcohol, Ethyl (B) <5 0 - 9 mg/dL    Comment:        LOWEST DETECTABLE LIMIT FOR SERUM ALCOHOL IS 11 mg/dL FOR MEDICAL PURPOSES ONLY   Salicylate level     Status: None   Collection Time: 03/11/14  9:11 AM  Result Value Ref Range   Salicylate Lvl <5.4 2.8 - 20.0 mg/dL  Urine Drug Screen     Status: Abnormal   Collection Time: 03/11/14 10:21 AM  Result Value Ref Range   Opiates POSITIVE (A) NONE DETECTED   Cocaine NONE DETECTED NONE DETECTED   Benzodiazepines NONE DETECTED NONE DETECTED   Amphetamines NONE DETECTED NONE DETECTED   Tetrahydrocannabinol NONE DETECTED NONE DETECTED   Barbiturates NONE DETECTED NONE DETECTED    Comment:        DRUG SCREEN FOR MEDICAL PURPOSES ONLY.  IF CONFIRMATION IS NEEDED FOR ANY PURPOSE, NOTIFY LAB WITHIN 5 DAYS.        LOWEST DETECTABLE LIMITS FOR URINE DRUG SCREEN Drug Class       Cutoff (ng/mL) Amphetamine      1000 Barbiturate      200 Benzodiazepine   492 Tricyclics       010 Opiates          300 Cocaine          300 THC              50    Psychological Evaluations:  Assessment:   DSM5:  AXIS I:  Bipolar 1 Disorder, most recent episode depressed, Opiate Dependence AXIS II:  Deferred AXIS III:   Past Medical History  Diagnosis Date  .  Dysrhythmia     Left sided heart diease  . Mental disorder   . Bipolar affective disorder, mixed, in partial or remission   . Headache(784.0)   . Anxiety   . Depression   . Substance abuse     Fentanyl,  Percocet. 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 History of gallstones   . PONV (postoperative nausea and vomiting)   . Sleep apnea   . Renal disorder   . Kidney stones   . Pneumonia     Hx: of as a child  . Migraine    AXIS IV:  decreased level of functioning, lack of daily structure, chronic pain AXIS V:  41-50 serious symptoms  Treatment Plan/Recommendations:   1. Admit for crisis management and stabilization. Estimated length of stay 5-7 days. 2. Medication management to reduce current symptoms to base line and improve the patient's level of functioning.  3. Develop treatment plan to decrease risk of relapse upon discharge of depressive symptoms and the need for readmission. 5. Group therapy to facilitate development of healthy coping skills to use for depression and anxiety. 6. Health care follow up as needed for medical problems.  7. Discharge plan to include therapy to help patient cope with  stressors.  8. Call for Consult with Hospitalist for additional specialty patient services as needed.   Treatment Plan Summary: Daily contact with patient to assess and evaluate symptoms and progress in treatment Medication management Current Medications:  Current Facility-Administered Medications  Medication Dose Route Frequency Provider Last Rate Last Dose  . acetaminophen (TYLENOL) tablet 650 mg  650 mg Oral Q6H PRN Waylan Boga, NP      . alum & mag hydroxide-simeth (MAALOX/MYLANTA) 200-200-20 MG/5ML suspension 30 mL  30 mL Oral Q4H PRN Waylan Boga, NP      . cloNIDine (CATAPRES) tablet 0.1 mg  0.1 mg Oral QID Waylan Boga, NP   0.1 mg at 03/12/14 0835   Followed by  . [START ON 03/13/2014] cloNIDine (CATAPRES) tablet 0.1 mg  0.1 mg Oral BH-qamhs Waylan Boga, NP       Followed by  . [START ON 03/16/2014] cloNIDine (CATAPRES) tablet 0.1 mg  0.1 mg Oral QAC breakfast Waylan Boga, NP      . dicyclomine (BENTYL) tablet 20 mg  20 mg Oral Q6H PRN Waylan Boga, NP      . hydrOXYzine (ATARAX/VISTARIL)  tablet 25 mg  25 mg Oral Q6H PRN Waylan Boga, NP      . hydrOXYzine (ATARAX/VISTARIL) tablet 25 mg  25 mg Oral QHS Waylan Boga, NP   25 mg at 03/11/14 2154  . ibuprofen (ADVIL,MOTRIN) tablet 600 mg  600 mg Oral Q8H PRN Waylan Boga, NP      . loperamide (IMODIUM) capsule 2-4 mg  2-4 mg Oral PRN Waylan Boga, NP      . LORazepam (ATIVAN) tablet 1 mg  1 mg Oral Q8H PRN Waylan Boga, NP   1 mg at 03/11/14 2154  . magnesium hydroxide (MILK OF MAGNESIA) suspension 30 mL  30 mL Oral Daily PRN Waylan Boga, NP      . methocarbamol (ROBAXIN) tablet 500 mg  500 mg Oral Q8H PRN Waylan Boga, NP      . ondansetron (ZOFRAN-ODT) disintegrating tablet 4 mg  4 mg Oral Q6H PRN Waylan Boga, NP   4 mg at 03/11/14 1824  . pregabalin (LYRICA) capsule 50 mg  50 mg Oral BID Waylan Boga, NP   50 mg at 03/12/14 0916  . QUEtiapine (SEROQUEL)  tablet 400 mg  400 mg Oral QHS Waylan Boga, NP   400 mg at 03/11/14 2154    Observation Level/Precautions:  15 minute checks  Laboratory:  CBC Chemistry Profile UDS  Psychotherapy:  Individual and Group Therapy  Medications:  Start Seroquel 400 mg hs for improved mood and for insomnia, Continue the clonidine protocol to address opiate dependence  Consultations:  As needed  Discharge Concerns:  Safety and Stability   Estimated LOS: 5-7 days  Other:  Increase collateral information   I certify that inpatient services furnished can reasonably be expected to improve the patient's condition.   Terea Neubauer NP-C 12/27/201510:36 AM

## 2014-03-12 NOTE — [facility] Suicide Risk Assessment (Signed)
   Nursing information obtained from:  Patient Demographic factors:  Caucasian female Current Mental Status:  depressed Loss Factors:  Decline in physical health, loss of significant relationship Historical Factors:  Impulsivity, hx of previous suicide attempts Risk Reduction Factors:  Positive coping skills or problem solving skills, positive social support, sense of responsibility to family, living with family members Total Time spent with patient: 45 minutes  CLINICAL FACTORS:   Bipolar Disorder:   Depressive phase Alcohol/Substance Abuse/Dependencies Chronic Pain  Psychiatric Specialty Exam: Physical Exam  ROS  Blood pressure 124/79, pulse 112, temperature 98 F (36.7 C), temperature source Oral, resp. rate 20, height 5\' 5"  (1.651 m), weight 92.987 kg (205 lb), SpO2 100 %.Body mass index is 34.11 kg/(m^2).  General Appearance: Casual  Eye Contact::  Good  Speech:  Clear and Coherent and Normal Rate  Volume:  Normal  Mood:  Depressed  Affect:  Congruent  Thought Process:  Goal Directed and Linear  Orientation:  Full (Time, Place, and Person)  Thought Content:  Negative  Suicidal Thoughts:  No  Homicidal Thoughts:  No  Memory:  Immediate;   Fair Recent;   Fair Remote;   Fair  Judgement:  Fair  Insight:  Fair  Psychomotor Activity:  Normal  Concentration:  Fair  Recall:  Good  Fund of [place]  Language: Fair  Akathisia:  No  Handed:  Right  AIMS (if indicated):     Assets:  Communication Skills Desire for Improvement Housing Social Support  Sleep:  Number of Hours: 6   Musculoskeletal: Strength & Muscle Tone: within normal limits Gait & Station: normal Patient leans: N/A  COGNITIVE FEATURES THAT CONTRIBUTE TO RISK:  Closed-mindedness    SUICIDE RISK:   Minimal: No identifiable suicidal ideation.  Patients presenting with no risk factors but with morbid ruminations; may be classified as minimal risk based on the severity of the depressive  symptoms  PLAN OF CARE:  I certify that inpatient services furnished can reasonably be expected to improve the patient's condition.  [name] 03/12/2014, 8:55 AM

## 2014-03-12 NOTE — [facility] Counselor (Signed)
Adult Comprehensive Assessment  Patient ID: [name], female   DOB: [birth year], 48 y.o.   MRN: [id number]  Information Source: Information source: Patient  Current Stressors:  Employment / Job issues: on disability Substance abuse: pain pil abuse Bereavement / Loss: loss mother last Thanksgiving  Living/Environment/Situation:  Living Arrangements: Children, Spouse/significant other Living conditions (as described by patient or guardian): Pt lives with husband and 5 kinds in [place].  Pt reports this is a good environment.  How long has patient lived in current situation?: 4 years What is atmosphere in current home: Comfortable, Quarry manager, Supportive  Family History:  Marital status: Married Number of Years Married: 60 What types of issues is patient dealing with in the relationship?: Pt denies having any issues, reports husband is supportive.  Additional relationship information: N/A Does patient have children?: Yes How many children?: 5 How is patient's relationship with their children?: 24, 58, 31, 84 and 47 year old children - all in the home.  Pt reports having a good relationship with all of them.    Childhood History:  By whom was/is the patient raised?: Both parents Additional childhood history information: Pt describes her childhood as "okay".  Description of patient's relationship with caregiver when they were a child: Pt reports getting along well with parents growing up.  Patient's description of current relationship with people who raised him/her: Mother is deceased, reports a good relationship with father today.   Does patient have siblings?: Yes Number of Siblings: 2 Description of patient's current relationship with siblings: Distant relationship with 2 brothers.   Did patient suffer any verbal/emotional/physical/sexual abuse as a child?: No Did patient suffer from severe childhood neglect?: No Has patient ever been sexually abused/assaulted/raped as an  adolescent or adult?: No Was the patient ever a victim of a crime or a disaster?: No Witnessed domestic violence?: No Has patient been effected by domestic violence as an adult?: No  Education:  Highest grade of school patient has completed: Some college Currently a Ship broker?: No Learning disability?: No  Employment/Work Situation:   Employment situation: On disability Why is patient on disability: Bipolar Disorder How long has patient been on disability: 4 years Patient's job has been impacted by current illness: No What is the longest time patient has a held a job?: 1 year 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  Normally take 6 pills a day for the last 5 months.  Prescribed medication.   If attempted suicide, did drugs/alcohol play a role in this?: No Alcohol/Substance Abuse Treatment Hx: Past detox, Attends AA/NA If yes, describe treatment: [facility] for detox in 2012.   Has alcohol/substance abuse ever caused legal problems?: No  Social Support System:   Patient's Community Support System: Good Describe Community Support System: Pt reports having a good support system of family and friends.   Type of faith/religion: [employer] How does patient's faith help to cope with current illness?: church attendance  Leisure/Recreation:   Leisure and Hobbies: Pt denies having any hobbies right now.  Strengths/Needs:   What things does the patient do well?: Pt unable to  name anything.  In what areas does patient struggle / problems for patient: substance abuse  Discharge Plan:   Does patient have  access to transportation?: Yes Will patient be returning to same living situation after discharge?: Yes Currently receiving community mental health services: No If no, would patient like referral for services when discharged?: Yes (What county?) [facility]) Does patient have financial barriers related to discharge medications?: No  Summary/Recommendations:     Patient is a 48 year old Caucasian Female with a diagnosis of Opioid Use Disorder and Bipolar Disorder.  Patient lives in [place] with her family.  Pt reports ongoing issues with over taking her prescribed pain medication with recent trigger for increase of use being the anniversary of the loss of her mother (Thanksgiving, a year ago).  Patient will benefit from crisis stabilization, medication evaluation, group therapy and psycho education in addition to case management for discharge planning. Discharge Process and Patient Expectations information sheet signed by patient, witnessed by writer and inserted in patient's shadow chart.    [place], [place] 03/12/2014

## 2014-03-12 NOTE — Progress Notes (Signed)
D: Pt has anxious affect and mood.  Pt reported she did not have a goal today.  She reported that she went to all the groups and that she feels her medications are helping her.  Pt denies SI/HI, denies hallucinations, denies pain.  Pt is visible in milieu with few interactions with peers.  She interacts with staff appropriately, smiling occasionally.  She attended evening group.  Pt reports her only withdrawal symptom is anxiety. A: Medications administered per order.  PRN medication administered for anxiety, see flowsheet.  Met with pt 1:1 and provided support and encouragement.  Safety maintained.   R: Pt is compliant with medications.  She verbally contracts for safety.  She reported that she will notify writer of needs and concerns.  Will continue to monitor and assess for safety.

## 2014-03-12 NOTE — [facility] Group Notes (Signed)
[facility] Group Notes:  (Nursing/MHT/Case Management/Adjunct)  Date:  03/12/2014  Time:  1:47 PM  Type of Therapy:  Psychoeducational Skills  Participation Level:  Active  Participation Quality:  Appropriate  Affect:  Appropriate  Cognitive:  Appropriate  Insight:  Appropriate  Engagement in Group:  Engaged  Modes of Intervention:  Discussion  Summary of Progress/Problems: Pt did attend self inventory group.    Benancio Deeds Shanta 03/12/2014, 1:47 PM

## 2014-03-12 NOTE — Progress Notes (Signed)
Psychoeducational Group Note  Date:  03/12/2014 Time:  1015  Group Topic/Focus:  Making Healthy Choices:   The focus of this group is to help patients identify negative/unhealthy choices they were using prior to admission and identify positive/healthier coping strategies to replace them upon discharge.  Participation Level:  Active  Participation Quality:  Appropriate  Affect:  Appropriate  Cognitive:  Oriented  Insight:  Improving  Engagement in Group:  Engaged  Additional Comments:  Interacted appropriately. States that knowledge was gained  Jameela Michna A 03/12/2014  

## 2014-03-12 NOTE — [facility] Group Notes (Signed)
[facility] LCSW Group Therapy  03/12/2014 10:00 AM   Type of Therapy:  Group Therapy  Participation Level:  Did Not Attend - pt was in group but left and didn't return   Regan Lemming, [place] 03/12/2014 11:48 AM

## 2014-03-12 NOTE — Plan of Care (Signed)
Problem: Alteration in mood Goal: LTG-Patient reports reduction in suicidal thoughts (Patient reports reduction in suicidal thoughts and is able to verbalize a safety plan for whenever patient is feeling suicidal)  Outcome: Progressing Pt denied SI this shift.  She verbally contracted for safety.

## 2014-03-13 DIAGNOSIS — R45851 Suicidal ideations: Secondary | ICD-10-CM

## 2014-03-13 DIAGNOSIS — Z634 Disappearance and death of family member: Secondary | ICD-10-CM

## 2014-03-13 MED ORDER — QUETIAPINE FUMARATE 400 MG PO TABS
800.0000 mg | ORAL_TABLET | Freq: Every day | ORAL | Status: DC
  Administered 2014-03-13 – 2014-03-14 (×2): 800 mg via ORAL
  Filled 2014-03-13: qty 2
  Filled 2014-03-13: qty 8
  Filled 2014-03-13 (×2): qty 2
  Filled 2014-03-13: qty 4

## 2014-03-13 MED ORDER — ZOLPIDEM TARTRATE 10 MG PO TABS: 10.0000 mg | ORAL_TABLET | Freq: Every evening | ORAL | Status: DC | PRN

## 2014-03-13 MED ORDER — TRAZODONE HCL 100 MG PO TABS: 100.0000 mg | ORAL_TABLET | Freq: Every evening | ORAL | Status: DC | PRN

## 2014-03-13 NOTE — Progress Notes (Signed)
[facility] MD Progress Note  03/13/2014 4:57 PM [name]  MRN:  [id number] Subjective:  [name] admits she is having a very hard time. She has been experiencing depression and cant get herself out of it. She has not been able to sleep. States if she does not sleep her mood gets to be very unstable. She states she was taking Seroquel 800 mg and Ambien at bedtime. She used to take Trazodone 400 mg with it but her outpatient PA took her off. States since then she cant sleep well. She has has a very hard time during the Holidays and specially after her mother died. She had gone to her outpatient appointment and shortly after that she OD. Diagnosis:   DSM5: Depressive Disorders:  Major Depressive Disorder - Severe (296.23) Total Time spent with patient: 30 minutes  Axis I: Bipolar, Depressed  ADL's:  Intact  Sleep: Poor  Appetite:  Poor  Psychiatric Specialty Exam: Physical Exam  Review of Systems  Constitutional: Positive for malaise/fatigue.  HENT: Negative.   Eyes: Negative.   Respiratory: Negative.   Cardiovascular: Negative.   Gastrointestinal: Negative.   Genitourinary: Negative.   Musculoskeletal: Negative.   Skin: Negative.   Neurological: Positive for weakness.  Endo/Heme/Allergies: Negative.   Psychiatric/Behavioral: Positive for depression and suicidal ideas. The patient is nervous/anxious and has insomnia.     Blood pressure 120/77, pulse 92, temperature 98 F (36.7 C), temperature source Oral, resp. rate 20, height 5\' 5"  (1.651 m), weight 92.987 kg (205 lb), SpO2 100 %.Body mass index is 34.11 kg/(m^2).  General Appearance: Fairly Groomed  Engineer, water::  Minimal  Speech:  Clear and Coherent and not spontaneous  Volume:  Decreased  Mood:  Depressed  Affect:  Depressed and Restricted  Thought Process:  Coherent and Goal Directed  Orientation:  Full (Time, Place, and Person)  Thought Content:  symptoms events worries concerns  Suicidal Thoughts:  Yes.  without  intent/plan  Homicidal Thoughts:  No  Memory:  Immediate;   Fair Recent;   Fair Remote;   Fair  Judgement:  Fair  Insight:  Present  Psychomotor Activity:  Decreased  Concentration:  Fair  Recall:  [employer] [place]: Fair  Akathisia:  No  Handed:    AIMS (if indicated):     Assets:  Desire for Improvement Housing Social Support  Sleep:  Number of Hours: 4.75   Musculoskeletal: Strength & Muscle Tone: within normal limits Gait & Station: normal Patient leans: N/A  Current Medications: Current Facility-Administered Medications  Medication Dose Route Frequency Provider Last Rate Last Dose  . acetaminophen (TYLENOL) tablet 650 mg  650 mg Oral Q6H PRN Waylan Boga, NP      . alum & mag hydroxide-simeth (MAALOX/MYLANTA) 200-200-20 MG/5ML suspension 30 mL  30 mL Oral Q4H PRN Waylan Boga, NP      . cloNIDine (CATAPRES) tablet 0.1 mg  0.1 mg Oral BH-qamhs Waylan Boga, NP       Followed by  . [START ON 03/16/2014] cloNIDine (CATAPRES) tablet 0.1 mg  0.1 mg Oral QAC breakfast Waylan Boga, NP      . dicyclomine (BENTYL) tablet 20 mg  20 mg Oral Q6H PRN Waylan Boga, NP      . hydrOXYzine (ATARAX/VISTARIL) tablet 25 mg  25 mg Oral Q6H PRN Waylan Boga, NP   25 mg at 03/13/14 1538  . hydrOXYzine (ATARAX/VISTARIL) tablet 25 mg  25 mg Oral QHS Waylan Boga, NP   25 mg at 03/12/14  2119  . ibuprofen (ADVIL,MOTRIN) tablet 600 mg  600 mg Oral Q8H PRN Waylan Boga, NP      . loperamide (IMODIUM) capsule 2-4 mg  2-4 mg Oral PRN Waylan Boga, NP      . LORazepam (ATIVAN) tablet 1 mg  1 mg Oral Q8H PRN Waylan Boga, NP   1 mg at 03/13/14 0816  . magnesium hydroxide (MILK OF MAGNESIA) suspension 30 mL  30 mL Oral Daily PRN Waylan Boga, NP      . methocarbamol (ROBAXIN) tablet 500 mg  500 mg Oral Q8H PRN Waylan Boga, NP      . ondansetron (ZOFRAN-ODT) disintegrating tablet 4 mg  4 mg Oral Q6H PRN Waylan Boga, NP   4 mg at 03/11/14 1824  . pregabalin (LYRICA) capsule 50 mg   50 mg Oral BID Waylan Boga, NP   50 mg at 03/13/14 0816  . QUEtiapine (SEROQUEL) tablet 800 mg  800 mg Oral QHS Nicholaus Bloom, MD      . traZODone (DESYREL) tablet 100 mg  100 mg Oral QHS PRN Nicholaus Bloom, MD      . zolpidem [facility]) tablet 10 mg  10 mg Oral QHS PRN Nicholaus Bloom, MD        Lab Results: No results found for this or any previous visit (from the past 48 hour(s)).  Physical Findings: AIMS: Facial and Oral Movements Muscles of Facial Expression: None, normal Lips and Perioral Area: None, normal Jaw: None, normal Tongue: None, normal,Extremity Movements Upper (arms, wrists, hands, fingers): None, normal Lower (legs, knees, ankles, toes): None, normal, Trunk Movements Neck, shoulders, hips: None, normal, Overall Severity Severity of abnormal movements (highest score from questions above): None, normal Incapacitation due to abnormal movements: None, normal Patient's awareness of abnormal movements (rate only patient's report): No Awareness, Dental Status Current problems with teeth and/or dentures?: No Does patient usually wear dentures?: No  CIWA:  CIWA-Ar Total: 0 COWS:  COWS Total Score: 4  Treatment Plan Summary: Daily contact with patient to assess and evaluate symptoms and progress in treatment Medication management  Plan: Supportive approach/coping skills/grief/loss           Bipolar Depression : will increased the Seroquel to 800 mg HS            Insomnia: will use Ambien 10 mg HS PRN sleep and get the Trazodone back at 100 mg            with plans to optimize respsonse  Medical Decision Making Problem Points:  Established problem, worsening (2) and Review of psycho-social stressors (1) Data Points:  Review or order clinical lab tests (1) Review of medication regiment & side effects (2) Review of new medications or change in dosage (2)  I certify that inpatient services furnished can reasonably be expected to improve the patient's condition.   Gaelan Glennon  A 03/13/2014, 4:57 PM

## 2014-03-13 NOTE — Progress Notes (Signed)
Patient ID: [name], female   DOB: Jan 25, 1966, 48 y.o.   MRN: [id number] PER STATE REGULATIONS 482.30  THIS CHART WAS REVIEWED FOR MEDICAL NECESSITY WITH RESPECT TO THE PATIENT'S ADMISSION/DURATION OF STAY.  NEXT REVIEW DATE: 03/15/14  Roma Schanz, RN, BSN CASE MANAGER

## 2014-03-13 NOTE — Progress Notes (Signed)
D: Patient's affect sad, depressed and mood is anxious. She reported on the self inventory sheet that her sleep and ability to concentrate are both poor, fair appetite and low energy level. Patient rates depression "7", feelings of hopelessness "1" and anxiety "8". Her goal today is to attend all groups. She's participating in groups and visible in the milieu. Patient adheres to medication regimen.  A: Support and encouragement provided to patient. Administered medications to pt. per ordering MD. Monitor Q15 minute checks for safety.  R: Patient receptive. Denies SI/HI and AVH. Patient remains safe on the unit.

## 2014-03-14 MED ORDER — TRAZODONE HCL 100 MG PO TABS
100.0000 mg | ORAL_TABLET | Freq: Every day | ORAL | Status: DC
  Filled 2014-03-14: qty 1

## 2014-03-14 MED ORDER — TRAZODONE HCL 100 MG PO TABS
100.0000 mg | ORAL_TABLET | Freq: Every evening | ORAL | Status: DC | PRN
  Administered 2014-03-14: 100 mg via ORAL
  Filled 2014-03-14: qty 4

## 2014-03-14 MED ORDER — ZOLPIDEM TARTRATE 10 MG PO TABS
10.0000 mg | ORAL_TABLET | Freq: Every day | ORAL | Status: DC
Start: 2014-03-14 — End: 2014-03-15
  Administered 2014-03-14: 10 mg via ORAL
  Filled 2014-03-14: qty 1

## 2014-03-14 NOTE — Tx Team (Signed)
Interdisciplinary Treatment Plan Update   Date Reviewed:  03/14/2014  Time Reviewed:  8:51 AM  Progress in Treatment:   Attending groups: Patient is attending groups. Participating in groups: Patient engages in group discussions. Taking medication as prescribed: Yes  Tolerating medication: Yes Family/Significant other contact made:  No, but will ask patient for consent for collateral contact Patient understands diagnosis: Yes  Discussing patient identified problems/goals with staff: Yes Medical problems stabilized or resolved: Yes Denies suicidal/homicidal ideation: Yes Patient has not harmed self or others: Yes  For review of initial/current patient goals, please see plan of care.  Estimated Length of Stay:    Reasons for Continued Hospitalization:  Anxiety Depression Medication stabilization Suicidal ideation  New Problems/Goals identified:    Discharge Plan or Barriers:   Home with outpatient follow up with [facility]  Additional Comments:  [name] is an 48 y.o. female. Patient was brought into the [facility] by husband because of intentional overdose of 20 Nucynta 75mg . Patient continues to endorse SI with plan to overdose and history of 6 attempts. Patient reports current depressive symptoms including decreased sleep(only 2-3 hours night), fatigue, decreased motivation, decreased eating habits, decreased hygiene, hopelessness, and symptoms has increased in the past month. Patient reports her mother died last year so the holidays are really hard on her emotionally. She reports participating with [facility] and last seen on 12/23. Patient denies HI, hallucination, and other self-injurious behaviors. Patient states during psychiatric assessment "I have had a hard time dealing with my mother dying last November. I have only been sleeping about two hours per night. My thoughts are still racing. I slept a little better last night. I still feel depressed. I  have not had any manic symptoms recently. I never experience psychosis. I have never tried to hurt myself before" Notes in epic report that the patient has had six prior suicide attempts in the past. Kanai was not forthcoming during assessment and appeared to be quite guarded. She describes a "tingling" pain in her hands and feet but can not offer a specific diagnosis that would indicate treatment with opiates.    Patient and CSW reviewed patient's identified goals and treatment plan.  Patient verbalized understanding and agreed to treatment plan.   Attendees:  Patient:  03/14/2014 8:51 AM   Signature:  03/14/2014 8:51 AM  Signature: Carlton Adam, MD 03/14/2014 8:51 AM  Signature:  Eduard Roux, RN 03/14/2014 8:51 AM  Signature: Darrol Angel,  RN 03/14/2014 8:51 AM  Signature:  Agustina Caroli, NP 03/14/2014 8:51 AM  Signature:  Joette Catching, LCSW 03/14/2014 8:51 AM  Signature:  Erasmo Downer Drinkard, LCSW-A 03/14/2014 8:51 AM  Signature:  Lucinda Dell, Care Coordinator [place] 03/14/2014 8:51 AM  Signature:  Marshall Cork, RN 03/14/2014 8:51 AM  Signature: Earleen Newport, NP 03/14/2014  8:51 AM  Signature:    03/14/2014  8:51 AM  Signature:  [place] Worker LCSW 03/14/2014  8:51 AM    Scribe for Treatment Team:   [employer],  03/14/2014 8:51 AM

## 2014-03-14 NOTE — BHH Group Notes (Signed)
LATE ENTRY FROM 03/13/14:    [facility] LCSW Aftercare Discharge Planning Group Note  03/13/14  8:45 AM   Participation Quality: Alert, Appropriate and Oriented  Mood/Affect: Depressed and Flat  Depression Rating: 5  Anxiety Rating: 6  Thoughts of Suicide: Pt denies SI/HI  Will you contract for safety? Yes  Current AVH: Pt denies  Plan for Discharge/Comments: Pt attended discharge planning group and actively participated in group. CSW provided pt with today's workbook. Patient reports feeling "okay" today. Patient reports being hospitalized due to substance abuse issues. She plans to return home at discharge and follow up with [facility].   Transportation Means: Pt reports access to transportation  Supports: No supports mentioned at this time  Tilden Fossa, MSW, [place] Social Worker [employer] [phone]

## 2014-03-14 NOTE — Plan of Care (Signed)
Problem: Diagnosis: Increased Risk For Suicide Attempt Goal: STG-Patient Will Comply With Medication Regime Outcome: Completed/Met Date Met:  03/14/14 Patient compliant with medication regimen. Goal met. 03/14/2014

## 2014-03-14 NOTE — Progress Notes (Signed)
Pt came to get medication at 2116 pt given Trazodone and [place]

## 2014-03-14 NOTE — Progress Notes (Signed)
Patient ID: [name], female   DOB: 03-Nov-1965, 48 y.o.   MRN: [id number]  DAR: Pt. Denies SI/HI and A/V Hallucinations to this Probation officer. Patient rates her depression and hopelessness at 2/10 for the day and anxiety 6/10 for the day. Patient does not report any pain or discomfort at this time. Patient reports her sleep was poor however night shift during report and time slept charted reflects that patient slept soundly last night. Patient reports low energy and poor concentration. Support and encouragement provided to the patient. Patient able to voice questions and concerns. Scheduled medications administered to patient per physician's orders. Patient received PRN Vistaril for anxiety and is tolerating medications. Patient is receptive and cooperative but minimal with Probation officer. Patient is attending groups and is seen in the milieu Q15 minute checks are maintained for safety.

## 2014-03-14 NOTE — Progress Notes (Signed)
Pt attended the evening AA speaker meeting.

## 2014-03-14 NOTE — Progress Notes (Signed)
D: Pt denies SI/HI/AVH. Pt is pleasant and cooperative. Pt continues to complain about not getting her medications and pt continues to say she did not sleep when this writer observed pt sleeping / snoring all last night. Pt continues to only be focused on medications, pt exhibits med seeking behaviors.  A: Pt was offered support and encouragement. Pt was given scheduled medications. Pt was encourage to attend groups. Q 15 minute checks were done for safety.   R:Pt attends groups and interacts well with peers and staff. Pt is taking medication.Pt receptive to treatment and safety maintained on unit.

## 2014-03-14 NOTE — Progress Notes (Signed)
[facility] MD Progress Note  03/14/2014 3:15 PM [name]  MRN:  [id number] Subjective:  [name] states that she did not sleep again last night. States she was given the Seroquel and the Trazodone but not the Ambien. States that without the combination she does not sleep well. States last time she was here she was not given the combination and she did not sleep for 8 days straight. States that when she goes home, she is going to be dealing with her adult children who live at the house not working depending on them. She states they don't even make an effort to get a job. One of them is working few hours at [employer]. States that if she was able to sleep better she would get to feel, better.  Diagnosis:   DSM5: Depressive Disorders:  Major Depressive Disorder - Severe (296.23) Total Time spent with patient: 30 minutes  Axis I: Bipolar, Depressed  ADL's:  Intact  Sleep: Poor  Appetite:  Fair  Psychiatric Specialty Exam: Physical Exam  Review of Systems  Constitutional: Negative.   HENT: Negative.   Eyes: Negative.   Respiratory: Negative.   Cardiovascular: Negative.   Gastrointestinal: Negative.   Genitourinary: Negative.   Musculoskeletal: Negative.   Skin: Negative.   Neurological: Negative.   Endo/Heme/Allergies: Negative.   Psychiatric/Behavioral: Positive for depression. The patient is nervous/anxious and has insomnia.     Blood pressure 119/87, pulse 95, temperature 98 F (36.7 C), temperature source Oral, resp. rate 20, height 5\' 5"  (1.651 m), weight 92.987 kg (205 lb), SpO2 100 %.Body mass index is 34.11 kg/(m^2).  General Appearance: Fairly Groomed  Engineer, water::  Fair  Speech:  Clear and Coherent  Volume:  Normal  Mood:  Anxious and worried  Affect:  anxious worried  Thought Process:  Coherent and Goal Directed  Orientation:  Full (Time, Place, and Person)  Thought Content:  symptoms worries concerns "wanting to sleep"  Suicidal Thoughts:  No  Homicidal Thoughts:  No   Memory:  Immediate;   Fair Recent;   Fair Remote;   Fair  Judgement:  Fair  Insight:  Present  Psychomotor Activity:  Normal  Concentration:  Fair  Recall:  [employer] [place]: Fair  Akathisia:  No  Handed:    AIMS (if indicated):     Assets:  Desire for Improvement Housing Social Support  Sleep:  Number of Hours: 6.75   Musculoskeletal: Strength & Muscle Tone: within normal limits Gait & Station: normal Patient leans: N/A  Current Medications: Current Facility-Administered Medications  Medication Dose Route Frequency Provider Last Rate Last Dose  . acetaminophen (TYLENOL) tablet 650 mg  650 mg Oral Q6H PRN Waylan Boga, NP      . alum & mag hydroxide-simeth (MAALOX/MYLANTA) 200-200-20 MG/5ML suspension 30 mL  30 mL Oral Q4H PRN Waylan Boga, NP      . cloNIDine (CATAPRES) tablet 0.1 mg  0.1 mg Oral BH-qamhs Waylan Boga, NP   0.1 mg at 03/14/14 0834   Followed by  . [START ON 03/16/2014] cloNIDine (CATAPRES) tablet 0.1 mg  0.1 mg Oral QAC breakfast Waylan Boga, NP      . dicyclomine (BENTYL) tablet 20 mg  20 mg Oral Q6H PRN Waylan Boga, NP      . hydrOXYzine (ATARAX/VISTARIL) tablet 25 mg  25 mg Oral Q6H PRN Waylan Boga, NP   25 mg at 03/14/14 0834  . hydrOXYzine (ATARAX/VISTARIL) tablet 25 mg  25 mg Oral QHS Theodoro Clock  Lord, NP   25 mg at 03/13/14 2126  . ibuprofen (ADVIL,MOTRIN) tablet 600 mg  600 mg Oral Q8H PRN Waylan Boga, NP      . loperamide (IMODIUM) capsule 2-4 mg  2-4 mg Oral PRN Waylan Boga, NP      . LORazepam (ATIVAN) tablet 1 mg  1 mg Oral Q8H PRN Waylan Boga, NP   1 mg at 03/13/14 0816  . magnesium hydroxide (MILK OF MAGNESIA) suspension 30 mL  30 mL Oral Daily PRN Waylan Boga, NP      . methocarbamol (ROBAXIN) tablet 500 mg  500 mg Oral Q8H PRN Waylan Boga, NP      . ondansetron (ZOFRAN-ODT) disintegrating tablet 4 mg  4 mg Oral Q6H PRN Waylan Boga, NP   4 mg at 03/11/14 1824  . pregabalin (LYRICA) capsule 50 mg  50 mg Oral BID  Waylan Boga, NP   50 mg at 03/14/14 0835  . QUEtiapine (SEROQUEL) tablet 800 mg  800 mg Oral QHS Nicholaus Bloom, MD   800 mg at 03/13/14 2125  . traZODone (DESYREL) tablet 100 mg  100 mg Oral QHS PRN Nicholaus Bloom, MD      . zolpidem [facility]) tablet 10 mg  10 mg Oral QHS Nicholaus Bloom, MD        Lab Results: No results found for this or any previous visit (from the past 60 hour(s)).  Physical Findings: AIMS: Facial and Oral Movements Muscles of Facial Expression: None, normal Lips and Perioral Area: None, normal Jaw: None, normal Tongue: None, normal,Extremity Movements Upper (arms, wrists, hands, fingers): None, normal Lower (legs, knees, ankles, toes): None, normal, Trunk Movements Neck, shoulders, hips: None, normal, Overall Severity Severity of abnormal movements (highest score from questions above): None, normal Incapacitation due to abnormal movements: None, normal Patient's awareness of abnormal movements (rate only patient's report): No Awareness, Dental Status Current problems with teeth and/or dentures?: No Does patient usually wear dentures?: No  CIWA:  CIWA-Ar Total: 0 COWS:  COWS Total Score: 3  Treatment Plan Summary: Daily contact with patient to assess and evaluate symptoms and progress in treatment Medication management  Plan: Supportive approach/coping skills           Bipolar Disorder: will continue to optimize response to psychotropics           Insomnia: will make the Ambien 10 mg standing to see if she can sleep better           Will work on strategies of how to handle the stressors at home once she is D/C  Medical Decision Making Problem Points:  Review of psycho-social stressors (1) Data Points:  Review of medication regiment & side effects (2) Review of new medications or change in dosage (2)  I certify that inpatient services furnished can reasonably be expected to improve the patient's condition.   [name] 03/14/2014, 3:15 PM

## 2014-03-14 NOTE — Progress Notes (Addendum)
Per charge nurse and pharmacy, pt has been on combination since 2012. pt given Seroquel, clonidine, and vistaril at 2130 and told to come get the Ambien at 2230. Pt did not come back to get medication.

## 2014-03-14 NOTE — Progress Notes (Signed)
Adult Psychoeducational Group Note  Date:  03/14/2014 Time:  1000  Group Topic/Focus:  Recovery Goals:   The focus of this group is to identify appropriate goals for recovery and establish a plan to achieve them.  Participation Level:  Active  Participation Quality:  Appropriate  Affect:  Appropriate  Cognitive:  Appropriate  Insight: Appropriate  Engagement in Group:  Engaged  Modes of Intervention:  Education  Additional Comments:     Tikesha Mort L 03/14/2014, 12:35 PM

## 2014-03-14 NOTE — Progress Notes (Signed)
Pt was given only Seroquel, vistaril, and clonidine and pt slept the whole night aeb visual assessment throughout the night and the 15 min check sheet, and was too sleepy to go to breakfast in the morning. Pt response was very lethargic and pt was hard to arouse.

## 2014-03-14 NOTE — BHH Group Notes (Signed)
LATE ENTRY FROM 03/13/14:      [place] LCSW Group Therapy 03/13/14  1:15 PM   Type of Therapy: Group Therapy  Participation Level: Did Not Attend. Patient invited to attend but declined.  Tilden Fossa, MSW, [place] Worker [facility] [phone]

## 2014-03-14 NOTE — BHH Group Notes (Signed)
[place] LCSW Group Therapy      Feelings About Diagnosis 1:15 - 2:30 PM         03/14/2014 3:15 PM \   Type of Therapy:  Group Therapy  Participation Level:  Active  Participation Quality:  Appropriate  Affect:  Appropriate  Cognitive:  Alert and Appropriate  Insight:  Developing/Improving and Engaged  Engagement in Therapy:  Developing/Improving and Engaged  Modes of Intervention:  Discussion, Education, Exploration, Problem-Solving, Rapport Building, Support  Summary of Progress/Problems:  Patient actively participated in group. Patient discussed past and present diagnosis and the effects it has had on  life.  Patient talked about family and society being judgmental and the stigma associated with having a mental health diagnosis.  She advised having Bipolar Disorder defines who she is and how she feel.  She shared that there are occasions when she is in control but most of the time, the disorder controls her and what she does.  Patient shared she takes her medications as prescribed and sees a therapist.  She advised of recent medication changes by her MD leading to her hospital admisison.  [name] 03/14/2014  3:15 PM

## 2014-03-14 NOTE — Progress Notes (Signed)
Recreation Therapy Notes  Animal-Assisted Activity/Therapy (AAA/T) Program Checklist/Progress Notes Patient Eligibility Criteria Checklist & Daily Group note for Rec Tx Intervention  Date: 12.29.2015 Time: 2:45pm Location: [address]    AAA/T Program Assumption of Risk Form signed by Patient/ or Parent Legal Guardian yes  Patient is free of allergies or sever asthma yes  Patient reports no fear of animals yes  Patient reports no history of cruelty to animals yes  Patient understands his/her participation is voluntary yes  Patient washes hands before animal contact yes  Patient washes hands after animal contact yes  Behavioral Response: Tearful   Education: Contractor, Appropriate Animal Interaction   Education Outcome: Acknowledges education.   Clinical Observations/Feedback: Upon seeing therapy dog patient became tearful, LRT inquired if patient had animals at home. Patient shared she has 3 and seeing therapy dog made her miss her dogs at home. Patient answered questions about her pets, but did not spontaneously volunteer information.   Laureen Ochs [name], LRT/CTRS  [name] 03/14/2014 4:21 PM

## 2014-03-14 NOTE — BHH Group Notes (Signed)
Adult Psychoeducational Group Note  Date:  03/14/2014 Time:  10:20 PM  Group Topic/Focus:  Wrap-Up Group:   The focus of this group is to help patients review their daily goal of treatment and discuss progress on daily workbooks.  Participation Level:  Minimal  Participation Quality:  Appropriate  Affect:  Flat  Cognitive:  Alert, Appropriate and Oriented  Insight: Improving  Engagement in Group:  Developing/Improving  Modes of Intervention:  Discussion and Support  Additional Comments:  Pt stated that her goal for today was to attend the groups and that she attended all groups as well as participated. Pt rates her day a 5 out of 10.   Lavinia Sharps P 03/14/2014, 10:20 PM

## 2014-03-14 NOTE — Progress Notes (Addendum)
D: Pt denies SI/HI/AVH. Pt is pleasant and cooperative. Pt focused on taking 800 mg Seroquel and 10 mg of Ambien.   A: Pt was offered support and encouragement. Pt was given scheduled medications. Pt was encourage to attend groups. Q 15 minute checks were done for safety. Pt only given clonidine .1, 25 mg vistaril with 800 mg Seroquel.   R:Pt attends groups and interacts well with peers and staff. Pt is taking medication. Pt has no complaints.Pt receptive to treatment and safety maintained on unit. Pt snoring loudly for the majority of the evening.

## 2014-03-15 DIAGNOSIS — F314 Bipolar disorder, current episode depressed, severe, without psychotic features: Principal | ICD-10-CM

## 2014-03-15 DIAGNOSIS — Z634 Disappearance and death of family member: Secondary | ICD-10-CM

## 2014-03-15 MED ORDER — LORAZEPAM 1 MG PO TABS: 1.0000 mg | ORAL_TABLET | Freq: Three times a day (TID) | ORAL | Status: DC | PRN

## 2014-03-15 MED ORDER — ZOLPIDEM TARTRATE 10 MG PO TABS
10.0000 mg | ORAL_TABLET | Freq: Every day | ORAL | Status: DC
Start: 2014-03-15 — End: 2018-08-27

## 2014-03-15 MED ORDER — TRAZODONE HCL 100 MG PO TABS: 100.0000 mg | ORAL_TABLET | Freq: Every evening | ORAL | Status: DC | PRN

## 2014-03-15 MED ORDER — QUETIAPINE FUMARATE 400 MG PO TABS: 800.0000 mg | ORAL_TABLET | Freq: Every day | ORAL | Status: DC

## 2014-03-15 MED ORDER — PREGABALIN 50 MG PO CAPS
50.0000 mg | ORAL_CAPSULE | Freq: Two times a day (BID) | ORAL | Status: DC
Start: 2014-03-15 — End: 2014-05-24

## 2014-03-15 MED ORDER — CLONIDINE HCL 0.1 MG PO TABS
0.1000 mg | ORAL_TABLET | Freq: Every day | ORAL | Status: DC
Start: 2014-03-16 — End: 2014-03-15

## 2014-03-15 MED ORDER — HYDROXYZINE HCL 25 MG PO TABS: ORAL_TABLET | ORAL | Status: DC

## 2014-03-15 NOTE — Plan of Care (Signed)
Problem: Ineffective individual coping Goal: LTG: Patient will report a decrease in negative feelings Outcome: Progressing Pt denies SI/HI/AVH at this time

## 2014-03-15 NOTE — Progress Notes (Signed)
Patient ID: [name], female   DOB: 03-Aug-1965, 48 y.o.   MRN: [id number]  Pt discharged home with her husband. Pt was stable and expressed gratitude. All papers and prescriptions were given and valuables returned. Verbal understanding expressed. Pt given an opportunity to express concerns and ask questions. Denies SI/HI and A/VH.

## 2014-03-15 NOTE — BHH Suicide Risk Assessment (Signed)
Suicide Risk Assessment  Discharge Assessment     Demographic Factors:  Caucasian  Total Time spent with patient: 30 minutes  Psychiatric Specialty Exam:     Blood pressure 119/85, pulse 121, temperature 98.2 F (36.8 C), temperature source Oral, resp. rate 18, height 5\' 5"  (1.651 m), weight 92.987 kg (205 lb), SpO2 100 %.Body mass index is 34.11 kg/(m^2).  General Appearance: Fairly Groomed  Engineer, water::  Fair  Speech:  Clear and Coherent  Volume:  Normal  Mood:  Euthymic  Affect:  Appropriate  Thought Process:  Coherent and Goal Directed  Orientation:  Full (Time, Place, and Person)  Thought Content:  plans as she moves on  Suicidal Thoughts:  No  Homicidal Thoughts:  No  Memory:  Immediate;   Fair Recent;   Fair Remote;   Fair  Judgement:  Fair  Insight:  Present  Psychomotor Activity:  Normal  Concentration:  Fair  Recall:  [employer] [place]  Language: Fair  Akathisia:  No  Handed:    AIMS (if indicated):     Assets:  Desire for Improvement Housing Social Support  Sleep:  Number of Hours: 6.75    Musculoskeletal: Strength & Muscle Tone: within normal limits Gait & Station: normal Patient leans: N/A   Mental Status Per Nursing Assessment::   On Admission:  NA  Current Mental Status by Physician: In full contact with reality. There are no active SI plans or intent. She states she feels much better. She will be mindful of any mood changes that could indicate that she could be headed for a relapse of the depression. She will see her PA tomorrow. She hopes that he continues to address her insomnia.   Loss Factors: Loss of significant relationship  Historical Factors: NA  Risk Reduction Factors:   Sense of responsibility to family, Living with another person, especially a relative and Positive social support  Continued Clinical Symptoms:  Bipolar Disorder:   Depressive phase  Cognitive Features That Contribute To Risk:   Closed-mindedness Polarized thinking Thought constriction (tunnel vision)    Suicide Risk:  Minimal: No identifiable suicidal ideation.  Patients presenting with no risk factors but with morbid ruminations; may be classified as minimal risk based on the severity of the depressive symptoms  Discharge Diagnoses:   AXIS I:  Bipolar Disorder, depressed, GAD AXIS II:  No diagnosis AXIS III:   Past Medical History  Diagnosis Date  . Dysrhythmia     Left sided heart diease  . Mental disorder   . Bipolar affective disorder, mixed, in partial or remission   . Headache(784.0)   . Anxiety   . Depression   . 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 History of gallstones   . PONV (postoperative nausea and vomiting)   . Sleep apnea   . Renal disorder   . Kidney stones   . Pneumonia     Hx: of as a child  . Migraine    AXIS IV:  other psychosocial or environmental problems AXIS V:  61-70 mild symptoms  Plan Of Care/Follow-up recommendations:  Activity:  as tolerated Diet:  regular Follow up [facility] 03/15/2014  Continue to work on life style changes that could help to better manage your mood disorder Is patient on multiple antipsychotic therapies at discharge:  No   Has Patient had three or more failed trials of antipsychotic monotherapy by history:  No  Recommended Plan for Multiple Antipsychotic Therapies: NA  Rosebud A 03/15/2014, 12:03 PM

## 2014-03-15 NOTE — BHH Group Notes (Signed)
[facility] LCSW Aftercare Discharge Planning Group Note   03/15/2014 1:03 PM    Participation Quality:  Appropraite  Mood/Affect:  Appropriate  Depression Rating:  1  Anxiety Rating:  3  Thoughts of Suicide:  No  Will you contract for safety?   NA  Current AVH:  No  Plan for Discharge/Comments:  Patient attended discharge planning group and actively participated in group. She reports doing well and will follow up with [facility] for outpatient services.  Suicide prevention education reviewed and SPE document provided.   Transportation Means: Patient has transportation.   Supports:  Patient has a support system.   Isaac Lacson, Eulas Post

## 2014-03-15 NOTE — Progress Notes (Signed)
Patient ID: [name], female   DOB: December 18, 1965, 48 y.o.   MRN: [id number]  Pt currently presents with a pleasant affect and behavior. Pt states that she "wants to go home to see her dogs and her family." Pt's daily goal is to "go home". Pt reports good sleep and appetite.  Pt provided with medications per providers orders. Pt's labs and vitals were monitored throughout the day. Pt supported emotionally and encouraged to express concerns and questions. Pt consulted with Education officer, museum, Probation officer and provider.  Pt's safety ensured with 15 minute and environmental checks. Pt currently denies SI/HI and A/V hallucinations. Pt verbally agrees to seek staff if SI/HI or A/VH occurs and to consult with staff before acting on these thoughts.

## 2014-03-15 NOTE — Progress Notes (Signed)
[facility] Adult Case Management Discharge Plan :  Will you be returning to the same living situation after discharge: Yes,  Patient is returning to her home. At discharge, do you have transportation home?:Yes,  Patient has transportation home. Do you have the ability to pay for your medications:  Yes, patient can afford medications.  Release of information consent forms completed and in the chart;  Patient's signature needed at discharge.  Patient to Follow up at: Follow-up Information    Follow up with      [facility].   Contact information:   [place], [place]   [address]  [phone]      Follow up with Comer Locket, [facility] On 03/16/2014.   Why:  You are scheduled with Comer Locket on Thursday, March 16, 2014 at 1:30 PM   Contact information:   [place], [place]   [address]  [phone]      Follow up with [facility] On 03/23/2014.   Why:  You are scheduled with Mirian Mo on Thursday, March 23, 2014 at 2:15 PM   Contact information:   [place], [place]  [phone]      Patient denies SI/HI: Patient no longer endorsing SI/HI or other thoughts of self harm.   Safety Planning and Suicide Prevention discussed:  .Reviewed with all patients during discharge planning group    Rayen Palen Hairston 03/15/2014, 1:04 PM

## 2014-03-15 NOTE — BHH Suicide Risk Assessment (Signed)
[facility] INPATIENT:  Family/Significant Other Suicide Prevention Education  Suicide Prevention Education:  Education Completed; Novalyn Lajara, Husband, [phone]; has been identified by the patient as the family member/significant other with whom the patient will be residing, and identified as the person(s) who will aid the patient in the event of a mental health crisis (suicidal ideations/suicide attempt).  With written consent from the patient, the family member/significant other has been provided the following suicide prevention education, prior to the and/or following the discharge of the patient.  The suicide prevention education provided includes the following:  Suicide risk factors  Suicide prevention and interventions  National Suicide Hotline telephone number  [facility] assessment telephone number  [facility] Emergency Assistance [place] and/or Residential Mobile Crisis Unit telephone number  Request made of family/significant other to:  Remove weapons (e.g., guns, rifles, knives), all items previously/currently identified as safety concern.   Husband advised patient does not have access to weapons.       Remove drugs/medications (over-the-counter, prescriptions, illicit drugs), all items previously/currently identified as a safety concern.  The family member/significant other verbalizes understanding of the suicide prevention education information provided.  The family member/significant other agrees to remove the items of safety concern listed above.  Concha Pyo 03/15/2014, 8:37 AM

## 2014-03-15 NOTE — Discharge Summary (Signed)
Physician Discharge Summary Note  Patient:  [name] is an 48 y.o., female MRN:  [id number] DOB:  Aug 08, 1965 Patient phone:  [phone] (home)  Patient address:   [place] [address],  Total Time spent with patient: Greater than 30 minutes  Date of Admission:  03/11/2014 Date of Discharge: 03/15/14  Reason for Admission: Worsening symptoms of depression, suicide attempt by overdose  Discharge Diagnoses: Active Problems:   Bipolar affective disorder, depressed, severe   Intentional opiate overdose   Bereavement   Psychiatric Specialty Exam: Physical Exam  Psychiatric: Her speech is normal and behavior is normal. Judgment and thought content normal. Her mood appears not anxious. Her affect is not angry, not blunt, not labile and not inappropriate. Cognition and memory are normal. She does not exhibit a depressed mood.    Review of Systems  Constitutional: Negative.   HENT: Negative.   Eyes: Negative.   Respiratory: Negative.   Cardiovascular: Negative.   Gastrointestinal: Negative.   Genitourinary: Negative.   Musculoskeletal: Negative.   Skin: Negative.   Neurological: Negative.   Endo/Heme/Allergies: Negative.   Psychiatric/Behavioral: Positive for depression (Stable) and substance abuse (Opioid overdose). Negative for suicidal ideas, hallucinations and memory loss. The patient has insomnia (Stable). The patient is not nervous/anxious.     Blood pressure 119/85, pulse 121, temperature 98.2 F (36.8 C), temperature source Oral, resp. rate 18, height 5\' 5"  (1.651 m), weight 92.987 kg (205 lb), SpO2 100 %.Body mass index is 34.11 kg/(m^2).  See Md's SRA       Past Psychiatric History: yes Diagnosis: Bipolar 1 affective disorder  Hospitalizations: [facility] 2012  Outpatient Care: [facility]  Substance Abuse Care: Denies  Self-Mutilation: Denies  Suicidal Attempts: yes, 6 times in the past  Violent Behaviors: Denies    Musculoskeletal: Strength & Muscle Tone: within normal limits Gait & Station: normal Patient leans: N/A  DSM5: Schizophrenia Disorders:   Obsessive-Compulsive Disorders:  NA Trauma-Stressor Disorders:  NA Substance/Addictive Disorders: NA Depressive Disorders:  Bipolar affective disorder, depressed, severe, Bereavement  Axis Diagnosis:  AXIS I:  Bipolar affective disorder, depressed, severe, Bereavement AXIS II:  Deferred AXIS III:   Past Medical History  Diagnosis Date  . Dysrhythmia     Left sided heart diease  . Mental disorder   . Bipolar affective disorder, mixed, in partial or remission   . Headache(784.0)   . Anxiety   . Depression   . 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 History of gallstones   . PONV (postoperative nausea and vomiting)   . Sleep apnea   . Renal disorder   . Kidney stones   . Pneumonia     Hx: of as a child  . Migraine    AXIS IV:  other psychosocial or environmental problems and mental illness, chronic, recent lost of a loved one AXIS V:  61  Level of Care:  OP  Hospital Course:  [name] is an 48 y.o. female. Patient was brought into the [facility] by husband because of intentional overdose of 20 Nucynta 75mg . Patient continues to endorse SI with plan to overdose and history of 6 attempts. Patient reports current depressive symptoms including decreased sleep(only 2-3 hours night), fatigue, decreased motivation, decreased eating habits, decreased hygiene, hopelessness, and symptoms has increased in the past month. Patient reports her mother died last year so the holidays are really hard on her emotionally.  While a patient in this hospital, [name] received  both mood stabilization and opioid detoxification treatments with Clonidine detox protocols. She was having exacerbation of symptoms of Schizoaffective disorder that led to her suicidal ideations & an attempt by overdose on 20 tablets of Nucynta. She was also enrolled  and participated in the group counseling sessions being offered and held on this unit. She learned coping skills that should help her cope better after discharge to maintain mood stability.  Besides the detox treatments, [name] was medicated and discharged on Lorazepam 1 mg on prn basis for anxiety, Seroquel 800 mg Q bedtime for mood control, hydroxyzine 25 mg for anxiety/sleep, Ambien 10 mg Q bedtime for insomnia & Trazodone 100 mg Q bedtime for insomnia. She was also resumed on her pertinent home medication for her other medical issues. She tolerated her treatment regimen without any significant adverse effects or reactions.  [name] has completed detox treatment and her mood is stable. She is currently being discharged to continue psychiatric treatment, medication management and counseling services at [facility]. She is provided with all the pertinent information needed to make these appointments without problems. Upon discharge, she adamantly denies any SIHI, AVH, delusional thoughts and or paranoia. She left [facility] with all personal belongings in no apparent distress. She received from the [facility], a 4 days worth, supply samples of her [facility] discharge medications. Transportation per patient's arrangement.       Consults:  psychiatry  Significant Diagnostic Studies:  labs: CBC with diff, CMP, UDS, toxicology tests, U/A, results reviewed, stable  Discharge Vitals:   Blood pressure 119/85, pulse 121, temperature 98.2 F (36.8 C), temperature source Oral, resp. rate 18, height 5\' 5"  (1.651 m), weight 92.987 kg (205 lb), SpO2 100 %. Body mass index is 34.11 kg/(m^2). Lab Results:   No results found for this or any previous visit (from the past 72 hour(s)).  Physical Findings: AIMS: Facial and Oral Movements Muscles of Facial Expression: None, normal Lips and Perioral Area: None, normal Jaw: None, normal Tongue: None, normal,Extremity Movements Upper (arms, wrists, hands,  fingers): None, normal Lower (legs, knees, ankles, toes): None, normal, Trunk Movements Neck, shoulders, hips: None, normal, Overall Severity Severity of abnormal movements (highest score from questions above): None, normal Incapacitation due to abnormal movements: None, normal Patient's awareness of abnormal movements (rate only patient's report): No Awareness, Dental Status Current problems with teeth and/or dentures?: No Does patient usually wear dentures?: No  CIWA:  CIWA-Ar Total: 0 COWS:  COWS Total Score: 3  Psychiatric Specialty Exam: See Psychiatric Specialty Exam and Suicide Risk Assessment completed by Attending Physician prior to discharge.  Discharge destination:  Home  Is patient on multiple antipsychotic therapies at discharge:  No   Has Patient had three or more failed trials of antipsychotic monotherapy by history:  No  Recommended Plan for Multiple Antipsychotic Therapies: NA    Medication List    STOP taking these medications        hydrOXYzine 25 MG capsule  Commonly known as:  VISTARIL     lithium carbonate 300 MG CR tablet  Commonly known as:  LITHOBID     potassium chloride SA 20 MEQ tablet  Commonly known as:  K-DUR,KLOR-CON     SUMAtriptan 25 MG tablet  Commonly known as:  IMITREX      TAKE these medications      Indication   hydrOXYzine 25 MG tablet  Commonly known as:  ATARAX/VISTARIL  Take one tablet three times daily as needed and 1 tablet at  bedtime: For anxiety/sleep   Indication:  Tension, Anxiety/insomnia     LORazepam 1 MG tablet  Commonly known as:  ATIVAN  Take 1 tablet (1 mg total) by mouth every 8 (eight) hours as needed for anxiety (agitation).   Indication:  Agitation/anxiety     pregabalin 50 MG capsule  Commonly known as:  LYRICA  Take 1 capsule (50 mg total) by mouth 2 (two) times daily. For pain   Indication:  Neuropathic Pain     QUEtiapine 400 MG tablet  Commonly known as:  SEROQUEL  Take 2 tablets (800 mg total)  by mouth at bedtime. For mood control   Indication:  Depressive Phase of Manic-Depression     traZODone 100 MG tablet  Commonly known as:  DESYREL  Take 1 tablet (100 mg total) by mouth at bedtime as needed for sleep.   Indication:  Trouble Sleeping     zolpidem 10 MG tablet  Commonly known as:  AMBIEN  Take 1 tablet (10 mg total) by mouth at bedtime. For sleep   Indication:  Trouble Sleeping       Follow-up Information    Follow up with      [facility].   Contact information:   [place], [place]   [address]  [phone]      Follow up with Comer Locket, [facility] On 03/16/2014.   Why:  You are scheduled with Comer Locket on Thursday, March 16, 2014 at 1:30 PM   Contact information:   [place], [place]   [address]  [phone]      Follow up with [facility] On 03/23/2014.   Why:  You are scheduled with Mirian Mo on Thursday, March 23, 2014 at 2:15 PM   Contact information:   [place], [place]  [phone]     Follow-up recommendations: Activity:  As tolerated Diet: As recommended by your primary care doctor. Keep all scheduled follow-up appointments as recommended.   Comments: Take all your medications as prescribed by your mental healthcare provider. Report any adverse effects and or reactions from your medicines to your outpatient provider promptly. Patient is instructed and cautioned to not engage in alcohol and or illegal drug use while on prescription medicines. In the event of worsening symptoms, patient is instructed to call the crisis hotline, 911 and or go to the nearest ED for appropriate evaluation and treatment of symptoms. Follow-up with your primary care provider for your other medical issues, concerns and or health care needs.   Total Discharge Time:  Greater than 30 minutes.  Signed: Encarnacion Slates, PMHNP, FNP-BC 03/15/2014, 11:53 AM   I personally assessed the patient and formulated the plan Geralyn Flash A. Sabra Heck, M.D.

## 2014-03-15 NOTE — Care Management Utilization Note (Signed)
   Per State Regulation 482.30  This chart was reviewed for necessity with respect to the patient's Admission/ Duration of stay.  Next review date: 03/18/14  Skipper Cliche RN, BSN

## 2014-03-20 NOTE — Progress Notes (Signed)
Patient Discharge Instructions:  After Visit Summary (AVS):   Faxed to:  03/20/14 Discharge Summary Note:   Faxed to:  03/20/14 Psychiatric Admission Assessment Note:   Faxed to:  03/20/14 Suicide Risk Assessment - Discharge Assessment:   Faxed to:  03/20/14 Faxed/Sent to the Next Level Care provider:  03/20/14 Faxed to Nara Visa @ [place], 03/20/2014, 3:23 PM

## 2014-04-18 ENCOUNTER — Ambulatory Visit (INDEPENDENT_AMBULATORY_CARE_PROVIDER_SITE_OTHER): Payer: Commercial Managed Care - HMO | Admitting: Physician Assistant

## 2014-04-18 ENCOUNTER — Encounter: Payer: Self-pay | Admitting: Physician Assistant

## 2014-04-18 VITALS — BP 121/69 | HR 102 | Temp 98.4°F | Resp 16 | Ht 66.0 in | Wt 201.2 lb

## 2014-04-18 DIAGNOSIS — B350 Tinea barbae and tinea capitis: Secondary | ICD-10-CM | POA: Insufficient documentation

## 2014-04-18 MED ORDER — TERBINAFINE HCL 250 MG PO TABS: 250.0000 mg | ORAL_TABLET | Freq: Every day | ORAL | Status: DC

## 2014-04-18 NOTE — Progress Notes (Signed)
Pre visit review using our clinic review tool, if applicable. No additional management support is needed unless otherwise documented below in the visit note/SLS  

## 2014-04-18 NOTE — Progress Notes (Signed)
Patient presents to clinic today c/o bald spot on the top of her head x 1 month. Endorses area is scaly and pruritic.  Denies lesion or drainage.  Denies pulling hair.  Denies family history of androgenic alopecia.  Past Medical History  Diagnosis Date  . Dysrhythmia     Left sided heart diease  . Mental disorder   . Bipolar affective disorder, mixed, in partial or remission   . Headache(784.0)   . Anxiety   . Depression   . 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 History of gallstones   . PONV (postoperative nausea and vomiting)   . Sleep apnea   . Renal disorder   . Kidney stones   . Pneumonia     Hx: of as a child  . Migraine     Current Outpatient Prescriptions on File Prior to Visit  Medication Sig Dispense Refill  . hydrOXYzine (ATARAX/VISTARIL) 25 MG tablet Take one tablet three times daily as needed and 1 tablet at bedtime: For anxiety/sleep 45 tablet 0  . LORazepam (ATIVAN) 1 MG tablet Take 1 tablet (1 mg total) by mouth every 8 (eight) hours as needed for anxiety (agitation). 7 tablet 0  . pregabalin (LYRICA) 50 MG capsule Take 1 capsule (50 mg total) by mouth 2 (two) times daily. For pain 14 capsule 0  . QUEtiapine (SEROQUEL) 400 MG tablet Take 2 tablets (800 mg total) by mouth at bedtime. For mood control 60 tablet 0  . traZODone (DESYREL) 100 MG tablet Take 1 tablet (100 mg total) by mouth at bedtime as needed for sleep. 30 tablet 0   No current facility-administered medications on file prior to visit.    No Known Allergies  Family History  Problem Relation Age of Onset  . Colon cancer Father 28  . Stomach cancer Neg Hx   . Rectal cancer Neg Hx   . Esophageal cancer Neg Hx   . Breast cancer Mother   . Breast cancer Father   . Uterine cancer Mother     History   Social History  . Marital Status: Married    Spouse Name: N/A    Number of Children: 6  . Years of Education: N/A   Occupational History  . nursing student    Social  History Main Topics  . Smoking status: Never Smoker   . Smokeless tobacco: Never Used  . Alcohol Use: No  . Drug Use: No     Comment: see triage note  . Sexual Activity: No   Other Topics Concern  . 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  All other ROS are negative.  BP 121/69 mmHg  Pulse 102  Temp(Src) 98.4 F (36.9 C) (Oral)  Resp 16  Ht 5' 6"  (1.676 m)  Wt 201 lb 4 oz (91.286 kg)  BMI 32.50 kg/m2  SpO2 99%  Physical Exam  Constitutional: She is oriented to person, place, and time and well-developed, well-nourished, and in no distress.  HENT:  Head: Normocephalic and atraumatic.  Cardiovascular: Normal rate, regular  rhythm, normal heart sounds and intact distal pulses.   Pulmonary/Chest: Effort normal and breath sounds normal. No respiratory distress. She has no wheezes. She has no rales. She exhibits no tenderness.  Neurological: She is alert and oriented to person, place, and time.  Skin: Skin is warm and dry.  Presence of a circular scaling rash of scalp with central clearing, consistent with tinea capitis.  Vitals reviewed.  Recent Results (from the past 2160 hour(s))  CBC with Differential     Status: Abnormal   Collection Time: 02/19/14  4:19 PM  Result Value Ref Range   WBC 10.7 (H) 4.0 - 10.5 K/uL   RBC 4.41 3.87 - 5.11 MIL/uL   Hemoglobin 12.5 12.0 - 15.0 g/dL   HCT 37.2 36.0 - 46.0 %   MCV 84.4 78.0 - 100.0 fL   MCH 28.3 26.0 - 34.0 pg   MCHC 33.6 30.0 - 36.0 g/dL   RDW 13.2 11.5 - 15.5 %   Platelets 148 (L) 150 - 400 K/uL   Neutrophils Relative % 87 (H) 43 - 77 %   Neutro Abs 9.3 (H) 1.7 - 7.7 K/uL   Lymphocytes Relative 8 (L) 12 - 46 %   Lymphs Abs 0.9 0.7  - 4.0 K/uL   Monocytes Relative 5 3 - 12 %   Monocytes Absolute 0.6 0.1 - 1.0 K/uL   Eosinophils Relative 0 0 - 5 %   Eosinophils Absolute 0.0 0.0 - 0.7 K/uL   Basophils Relative 0 0 - 1 %   Basophils Absolute 0.0 0.0 - 0.1 K/uL  TSH     Status: None   Collection Time: 02/19/14  4:19 PM  Result Value Ref Range   TSH 2.110 0.350 - 4.500 uIU/mL  POCT urinalysis dip (device)     Status: Abnormal   Collection Time: 02/19/14  4:46 PM  Result Value Ref Range   Glucose, UA NEGATIVE NEGATIVE mg/dL   Bilirubin Urine NEGATIVE NEGATIVE   Ketones, ur NEGATIVE NEGATIVE mg/dL   Specific Gravity, Urine >=1.030 1.005 - 1.030   Hgb urine dipstick NEGATIVE NEGATIVE   pH 5.5 5.0 - 8.0   Protein, ur NEGATIVE NEGATIVE mg/dL   Urobilinogen, UA 0.2 0.0 - 1.0 mg/dL   Nitrite NEGATIVE NEGATIVE   Leukocytes, UA TRACE (A) NEGATIVE    Comment: Biochemical Testing Only. Please order routine urinalysis from main lab if confirmatory testing is needed.  I-STAT, chem 8     Status: Abnormal   Collection Time: 02/19/14  4:49 PM  Result Value Ref Range   Sodium 142 137 - 147 mEq/L   Potassium 3.2 (L) 3.7 - 5.3 mEq/L   Chloride 105 96 - 112 mEq/L   BUN 11 6 - 23 mg/dL   Creatinine, Ser 0.80 0.50 - 1.10 mg/dL   Glucose, Bld 121 (H) 70 - 99 mg/dL   Calcium, Ion 1.21 1.12 - 1.23 mmol/L   TCO2 22 0 - 100 mmol/L   Hemoglobin 13.3 12.0 - 15.0 g/dL   HCT 39.0 36.0 - 46.0 %  Urine culture     Status: None   Collection Time: 02/19/14  5:42 PM  Result Value Ref Range   Specimen Description URINE, CLEAN CATCH    Special Requests NONE    Culture  Setup Time      02/19/2014 20:26 Performed at [facility]      6,000 COLONIES/ML Performed at [facility]  INSIGNIFICANT GROWTH Performed at [facility]    Report Status 02/20/2014 FINAL   Acetaminophen level     Status: Abnormal   Collection Time: 03/11/14  9:11 AM  Result Value Ref Range   Acetaminophen  (Tylenol), Serum <10.0 (L) 10 - 30 ug/mL    Comment:        THERAPEUTIC CONCENTRATIONS VARY SIGNIFICANTLY. A RANGE OF 10-30 ug/mL MAY BE AN EFFECTIVE CONCENTRATION FOR MANY PATIENTS. HOWEVER, SOME ARE BEST TREATED AT CONCENTRATIONS OUTSIDE THIS RANGE. ACETAMINOPHEN CONCENTRATIONS >150 ug/mL AT 4 HOURS AFTER INGESTION AND >50 ug/mL AT 12 HOURS AFTER INGESTION ARE OFTEN ASSOCIATED WITH TOXIC REACTIONS.   CBC     Status: Abnormal   Collection Time: 03/11/14  9:11 AM  Result Value Ref Range   WBC 4.0 4.0 - 10.5 K/uL   RBC 4.42 3.87 - 5.11 MIL/uL   Hemoglobin 12.8 12.0 - 15.0 g/dL   HCT 37.8 36.0 - 46.0 %   MCV 85.5 78.0 - 100.0 fL   MCH 29.0 26.0 - 34.0 pg   MCHC 33.9 30.0 - 36.0 g/dL   RDW 13.6 11.5 - 15.5 %   Platelets 132 (L) 150 - 400 K/uL  Comprehensive metabolic panel     Status: None   Collection Time: 03/11/14  9:11 AM  Result Value Ref Range   Sodium 136 135 - 145 mmol/L    Comment: Please note change in reference range. DELTA CHECK NOTED QUANTITY NOT SUFFICIENT TO REPEAT TEST    Potassium 3.7 3.5 - 5.1 mmol/L    Comment: Please note change in reference range.   Chloride 107 96 - 112 mEq/L   CO2 21 19 - 32 mmol/L   Glucose, Bld 89 70 - 99 mg/dL   BUN 13 6 - 23 mg/dL   Creatinine, Ser 0.72 0.50 - 1.10 mg/dL   Calcium 8.7 8.4 - 10.5 mg/dL   Total Protein 6.6 6.0 - 8.3 g/dL   Albumin 3.8 3.5 - 5.2 g/dL   AST 16 0 - 37 U/L   ALT 20 0 - 35 U/L   Alkaline Phosphatase 71 39 - 117 U/L   Total Bilirubin 0.6 0.3 - 1.2 mg/dL   GFR calc non Af Amer >90 >90 mL/min   GFR calc Af Amer >90 >90 mL/min    Comment: (NOTE) The eGFR has been calculated using the CKD EPI equation. This calculation has not been validated in all clinical situations. eGFR's persistently <90 mL/min signify possible Chronic Kidney Disease.    Anion gap 8 5 - 15  Ethanol (ETOH)     Status: None   Collection Time: 03/11/14  9:11 AM  Result Value Ref Range   Alcohol, Ethyl (B) <5 0 - 9 mg/dL     Comment:        LOWEST DETECTABLE LIMIT FOR SERUM ALCOHOL IS 11 mg/dL FOR MEDICAL PURPOSES ONLY   Salicylate level     Status: None   Collection Time: 03/11/14  9:11 AM  Result Value Ref Range   Salicylate Lvl <8.3 2.8 - 20.0 mg/dL  Urine Drug Screen     Status: Abnormal   Collection Time: 03/11/14 10:21 AM  Result Value Ref Range   Opiates POSITIVE (A) NONE DETECTED   Cocaine NONE DETECTED NONE DETECTED   Benzodiazepines NONE DETECTED NONE DETECTED   Amphetamines NONE DETECTED NONE DETECTED   Tetrahydrocannabinol NONE DETECTED NONE DETECTED   Barbiturates NONE DETECTED NONE DETECTED    Comment:  DRUG SCREEN FOR MEDICAL PURPOSES ONLY.  IF CONFIRMATION IS NEEDED FOR ANY PURPOSE, NOTIFY LAB WITHIN 5 DAYS.        LOWEST DETECTABLE LIMITS FOR URINE DRUG SCREEN Drug Class       Cutoff (ng/mL) Amphetamine      1000 Barbiturate      200 Benzodiazepine   338 Tricyclics       250 Opiates          300 Cocaine          300 THC              50     Assessment/Plan: Tinea capitis Rx PO Lamisil daily for 6 weeks.  Supportive measures discussed.  Follow-up in 1 month.

## 2014-04-18 NOTE — Patient Instructions (Signed)
Please take the Lamisil as directed daily. Avoid heavily perfumed soaps and shampoos. Keep hair clean and dry.  Avoid scratching.    Follow-up in 1 month  Scalp Ringworm (Tinea Capitis)  Scalp ringworm is an infection of the skin on the head. It is mainly seen in children. HOME CARE  Only take medicine as told by your doctor. Medicine must be taken for 6 to 8 weeks to kill the fungus. Steroid medicines are used for very bad cases to reduce redness, soreness, and puffiness (inflammation).  Watch to see if ringworm develops in your family or pets. Treat any family members or pets that have the fungus. The fungus can spread from person to person (contagious).  Use medicated shampoos to help stop the fungus from spreading.  Do not share towels, brushes, combs, hair clips, or hats.  Children may go to school once they start taking medicine.  Follow up with your doctor as told to be sure the infection is gone. It can take 1 month or more to treat scalp ringworm. If you do not treat it as told, the ringworm can come back. GET HELP RIGHT AWAY IF:   The area becomes red, warm, tender, and puffy (swollen).  Yellowish white fluid (pus) comes from the rash.  You or your child has a temperature by mouth above 102 F (38.9 C), not controlled by medicine.  The rash gets worse or spreads.  The rash returns after treatment is done.  The rash is not better after 2 weeks of treatment. MAKE SURE YOU:  Understand these instructions.  Will watch your condition.  Will get help right away if you are not doing well or get worse. Document Released: 02/19/2009 Document Revised: 07/18/2013 Document Reviewed: 06/08/2009 [facility] Patient Information 2015 [place], [place]. This information is not intended to replace advice given to you by your health care provider. Make sure you discuss any questions you have with your health care provider.

## 2014-04-18 NOTE — Assessment & Plan Note (Signed)
Rx PO Lamisil daily for 6 weeks.  Supportive measures discussed.  Follow-up in 1 month.

## 2014-05-15 ENCOUNTER — Ambulatory Visit (INDEPENDENT_AMBULATORY_CARE_PROVIDER_SITE_OTHER): Payer: Commercial Managed Care - HMO | Admitting: Physician Assistant

## 2014-05-16 ENCOUNTER — Ambulatory Visit: Payer: Medicare PPO | Admitting: Physician Assistant

## 2014-05-18 ENCOUNTER — Encounter: Payer: Self-pay | Admitting: Physician Assistant

## 2014-05-18 ENCOUNTER — Telehealth: Payer: Self-pay | Admitting: Physician Assistant

## 2014-05-18 NOTE — Telephone Encounter (Signed)
No charge -- patient did call ahead of time to explain absence

## 2014-05-18 NOTE — Telephone Encounter (Signed)
Pt was no show for appt on 05/16/14- has rescheduled for 3/9. Charge no show?

## 2014-05-18 NOTE — Telephone Encounter (Signed)
No charge for missed appt.

## 2014-05-24 ENCOUNTER — Encounter: Payer: Self-pay | Admitting: Physician Assistant

## 2014-05-24 ENCOUNTER — Ambulatory Visit (INDEPENDENT_AMBULATORY_CARE_PROVIDER_SITE_OTHER): Payer: Commercial Managed Care - HMO | Admitting: Physician Assistant

## 2014-05-24 VITALS — BP 100/64 | HR 96 | Temp 98.0°F | Resp 16 | Ht 66.0 in | Wt 198.5 lb

## 2014-05-24 DIAGNOSIS — B350 Tinea barbae and tinea capitis: Secondary | ICD-10-CM

## 2014-05-24 DIAGNOSIS — G894 Chronic pain syndrome: Secondary | ICD-10-CM | POA: Diagnosis not present

## 2014-05-24 MED ORDER — TERBINAFINE HCL 250 MG PO TABS: 250.0000 mg | ORAL_TABLET | Freq: Every day | ORAL | Status: DC

## 2014-05-24 MED ORDER — PREGABALIN 50 MG PO CAPS: 50.0000 mg | ORAL_CAPSULE | Freq: Two times a day (BID) | ORAL | Status: DC

## 2014-05-24 NOTE — Assessment & Plan Note (Signed)
Giving her documented causes of chronic pain, fill it is acceptable to continue the Lyrica. Patient is aware given her history that no narcotic medications will be prescribed by this practice. We'll only receive her zolpidem and other controlled medicines from psychiatry. We'll continue Lyrica 50 mg twice a day. Follow-up in one month.

## 2014-05-24 NOTE — Patient Instructions (Signed)
Please take your medications as directed.  Restart the Lyrica twice daily.  Please follow-up with me in 2 months so we can see how you are doing.  Return sooner if needed.

## 2014-05-24 NOTE — Progress Notes (Signed)
Patient presents to clinic today for medication management.  Patient is requesting that we take over her Lyrica prescription for chronic pain is currently only seen psychiatrist, who states pain medications needed to come from primary care pain management. Patient was previously released from her pain management clinic after overdosing on Nucynta.  Past Medical History  Diagnosis Date  . Dysrhythmia     Left sided heart diease  . Mental disorder   . Bipolar affective disorder, mixed, in partial or remission   . Headache(784.0)   . Anxiety   . Depression   . 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 History of gallstones   . PONV (postoperative nausea and vomiting)   . Sleep apnea   . Renal disorder   . Kidney stones   . Pneumonia     Hx: of as a child  . Migraine     Current Outpatient Prescriptions on File Prior to Visit  Medication Sig Dispense Refill  . QUEtiapine (SEROQUEL) 400 MG tablet Take 2 tablets (800 mg total) by mouth at bedtime. For mood control 60 tablet 0  . traZODone (DESYREL) 100 MG tablet Take 1 tablet (100 mg total) by mouth at bedtime as needed for sleep. 30 tablet 0   No current facility-administered medications on file prior to visit.    No Known Allergies  Family History  Problem Relation Age of Onset  . Colon cancer Father 14  . Stomach cancer Neg Hx   . Rectal cancer Neg Hx   . Esophageal cancer Neg Hx   . Breast cancer Mother   . Breast cancer Father   . Uterine cancer Mother     History   Social History  . Marital Status: Married    Spouse Name: N/A  . Number of Children: 6  . Years of Education: N/A   Occupational History  . nursing student    Social History Main Topics  . Smoking status: Never Smoker   . Smokeless tobacco: Never Used  . Alcohol Use: No  . Drug Use: No     Comment: see triage note  . Sexual Activity: No   Other Topics Concern  . 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  All other ROS are negative.  BP 100/64 mmHg  Pulse 96  Temp(Src) 98 F (36.7 C) (Oral)  Resp 16  Ht 5' 6"  (1.676 m)  Wt 198 lb 8 oz (90.039 kg)  BMI 32.05 kg/m2  SpO2 100%  Physical Exam  Constitutional: She is oriented to person, place, and time and well-developed, well-nourished, and in no distress.  Cardiovascular: Normal rate, regular rhythm, normal heart sounds and intact distal pulses.   Pulmonary/Chest: Effort normal and breath sounds normal. No respiratory distress. She has no wheezes. She has no rales. She exhibits no tenderness.  Neurological: She is alert and oriented to person, place, and time.  Skin: Skin is warm and dry. No rash noted.  Psychiatric: Affect normal.  Vitals reviewed.   Recent Results (from the past 2160 hour(s))  Acetaminophen level     Status: Abnormal   Collection Time: 03/11/14  9:11 AM  Result Value Ref Range   Acetaminophen (Tylenol), Serum <10.0 (L) 10 - 30 ug/mL    Comment:        THERAPEUTIC CONCENTRATIONS VARY SIGNIFICANTLY. A RANGE OF 10-30 ug/mL MAY BE AN EFFECTIVE CONCENTRATION FOR MANY PATIENTS. HOWEVER, SOME ARE BEST TREATED AT CONCENTRATIONS OUTSIDE THIS RANGE. ACETAMINOPHEN CONCENTRATIONS >150 ug/mL AT 4 HOURS AFTER INGESTION AND >50 ug/mL AT 12 HOURS AFTER INGESTION ARE OFTEN ASSOCIATED WITH TOXIC REACTIONS.   CBC     Status: Abnormal   Collection Time: 03/11/14  9:11 AM  Result Value Ref Range   WBC 4.0 4.0 - 10.5 K/uL   RBC 4.42 3.87 - 5.11 MIL/uL   Hemoglobin 12.8 12.0 - 15.0 g/dL   HCT 37.8 36.0 - 46.0 %   MCV 85.5 78.0 - 100.0 fL   MCH 29.0 26.0 - 34.0 pg   MCHC 33.9 30.0 - 36.0 g/dL   RDW 13.6 11.5 - 15.5 %   Platelets 132 (L) 150 -  400 K/uL  Comprehensive metabolic panel     Status: None   Collection Time: 03/11/14  9:11 AM  Result Value Ref Range   Sodium 136 135 - 145 mmol/L    Comment: Please note change in reference range. DELTA CHECK NOTED QUANTITY NOT SUFFICIENT TO REPEAT TEST    Potassium 3.7 3.5 - 5.1 mmol/L    Comment: Please note change in reference range.   Chloride 107 96 - 112 mEq/L   CO2 21 19 - 32 mmol/L   Glucose, Bld 89 70 - 99 mg/dL   BUN 13 6 - 23 mg/dL   Creatinine, Ser 0.72 0.50 - 1.10 mg/dL   Calcium 8.7 8.4 - 10.5 mg/dL   Total Protein 6.6 6.0 - 8.3 g/dL   Albumin 3.8 3.5 - 5.2 g/dL   AST 16 0 - 37 U/L   ALT 20 0 - 35 U/L   Alkaline Phosphatase 71 39 - 117 U/L   Total Bilirubin 0.6 0.3 - 1.2 mg/dL   GFR calc non Af Amer >90 >90 mL/min   GFR calc Af Amer >90 >90 mL/min    Comment: (NOTE) The eGFR has been calculated using the CKD EPI equation. This calculation has not been validated in all clinical situations. eGFR's persistently <90 mL/min signify possible Chronic Kidney Disease.    Anion gap 8 5 - 15  Ethanol (ETOH)     Status: None   Collection Time: 03/11/14  9:11 AM  Result Value Ref Range   Alcohol, Ethyl (B) <5 0 - 9 mg/dL    Comment:        LOWEST DETECTABLE LIMIT FOR SERUM ALCOHOL IS 11 mg/dL FOR MEDICAL PURPOSES ONLY   Salicylate level     Status: None   Collection Time: 03/11/14  9:11 AM  Result Value Ref Range   Salicylate Lvl <9.4 2.8 - 20.0 mg/dL  Urine Drug Screen     Status: Abnormal   Collection Time: 03/11/14 10:21 AM  Result Value Ref Range   Opiates POSITIVE (A) NONE DETECTED   Cocaine NONE DETECTED NONE DETECTED   Benzodiazepines NONE DETECTED NONE DETECTED   Amphetamines NONE DETECTED NONE DETECTED   Tetrahydrocannabinol NONE DETECTED NONE DETECTED   Barbiturates NONE DETECTED NONE DETECTED    Comment:        DRUG SCREEN FOR MEDICAL PURPOSES ONLY.  IF CONFIRMATION IS NEEDED FOR ANY PURPOSE, NOTIFY LAB  WITHIN 5 DAYS.        LOWEST DETECTABLE  LIMITS FOR URINE DRUG SCREEN Drug Class       Cutoff (ng/mL) Amphetamine      1000 Barbiturate      200 Benzodiazepine   569 Tricyclics       794 Opiates          300 Cocaine          300 THC              50     Assessment/Plan: Chronic pain syndrome Giving her documented causes of chronic pain, fill it is acceptable to continue the Lyrica. Patient is aware given her history that no narcotic medications will be prescribed by this practice. We'll only receive her zolpidem and other controlled medicines from psychiatry. We'll continue Lyrica 50 mg twice a day. Follow-up in one month.

## 2014-05-24 NOTE — Progress Notes (Signed)
Pre visit review using our clinic review tool, if applicable. No additional management support is needed unless otherwise documented below in the visit note/SLS  

## 2014-06-12 ENCOUNTER — Telehealth: Payer: Self-pay | Admitting: Physician Assistant

## 2014-06-12 NOTE — Telephone Encounter (Signed)
Caller name: Vito Backers from [place]  Call back number: [phone]   Reason for call:  Pt has an appointment 06/13/14 at 2:45 with Dr. Herma Ard requesting a referral.

## 2014-09-01 ENCOUNTER — Ambulatory Visit (INDEPENDENT_AMBULATORY_CARE_PROVIDER_SITE_OTHER): Payer: Commercial Managed Care - HMO | Admitting: Physician Assistant

## 2014-09-01 ENCOUNTER — Encounter: Payer: Self-pay | Admitting: Physician Assistant

## 2014-09-01 VITALS — BP 122/77 | HR 83 | Temp 98.3°F | Resp 16 | Ht 66.0 in | Wt 202.5 lb

## 2014-09-01 DIAGNOSIS — K219 Gastro-esophageal reflux disease without esophagitis: Secondary | ICD-10-CM | POA: Diagnosis not present

## 2014-09-01 DIAGNOSIS — L03012 Cellulitis of left finger: Secondary | ICD-10-CM | POA: Diagnosis not present

## 2014-09-01 MED ORDER — TRAMADOL HCL 50 MG PO TABS: 50.0000 mg | ORAL_TABLET | Freq: Three times a day (TID) | ORAL | Status: DC | PRN

## 2014-09-01 MED ORDER — OMEPRAZOLE 20 MG PO CPDR: 20.0000 mg | DELAYED_RELEASE_CAPSULE | Freq: Every day | ORAL | Status: DC

## 2014-09-01 MED ORDER — MUPIROCIN 2 % EX OINT: TOPICAL_OINTMENT | CUTANEOUS | Status: DC

## 2014-09-01 NOTE — Patient Instructions (Signed)
Please use antibiotic topically as directed.  Keep clean and dry. Stay well hydrated. Wear supportive footwear. Pain medication only if needed.  This was a one-time prescription.  You will be contacted by Podiatry for management.  Take the prilosec daily as directed.

## 2014-09-01 NOTE — Progress Notes (Signed)
Pre visit review using our clinic review tool, if applicable. No additional management support is needed unless otherwise documented below in the visit note/SLS  

## 2014-09-02 DIAGNOSIS — K219 Gastro-esophageal reflux disease without esophagitis: Secondary | ICD-10-CM | POA: Insufficient documentation

## 2014-09-02 DIAGNOSIS — IMO0002 Reserved for concepts with insufficient information to code with codable children: Secondary | ICD-10-CM | POA: Insufficient documentation

## 2014-09-02 NOTE — Assessment & Plan Note (Signed)
Rx Prilosec. GERD diet discussed. Handout given.  Follow-up 1 month.

## 2014-09-02 NOTE — Assessment & Plan Note (Signed)
Topical ABX as directed. Peroxide soaks.  Referral to podiatry placed.

## 2014-09-02 NOTE — Progress Notes (Signed)
Patient presents to clinic today c/o ingrown toenail of left great toe x 1 month with surrounding erythema and tenderness.  Denies drainage from foot.  Denies trauma precipitating nail deformity.  Was seen by her Orthopedist who states she should see her PCP for podiatry referral.  Patient also notes heartburn symptoms over the past 3-4 weeks.  Denies change in diet.  Denies nausea/vomiting or abdominal discomfort.  Symptoms are present daily and are worsening per patient. Patient with prior history of GERD treated successfully with PPI therapy.  Past Medical History  Diagnosis Date  . Dysrhythmia     Left sided heart diease  . Mental disorder   . Bipolar affective disorder, mixed, in partial or remission   . Headache(784.0)   . Anxiety   . Depression   . 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 History of gallstones   . PONV (postoperative nausea and vomiting)   . Sleep apnea   . Renal disorder   . Kidney stones   . Pneumonia     Hx: of as a child  . Migraine     Current Outpatient Prescriptions on File Prior to Visit  Medication Sig Dispense Refill  . lithium carbonate 300 MG capsule Take 600 mg by mouth at bedtime.     . pregabalin (LYRICA) 50 MG capsule Take 1 capsule (50 mg total) by mouth 2 (two) times daily. For pain 60 capsule 0  . QUEtiapine (SEROQUEL) 400 MG tablet Take 2 tablets (800 mg total) by mouth at bedtime. For mood control 60 tablet 0  . risperidone (RISPERDAL) 4 MG tablet Take 4 mg by mouth every morning.     . terbinafine (LAMISIL) 250 MG tablet Take 1 tablet (250 mg total) by mouth daily. 30 tablet 0  . traZODone (DESYREL) 100 MG tablet Take 1 tablet (100 mg total) by mouth at bedtime as needed for sleep. 30 tablet 0  . zolpidem (AMBIEN) 10 MG tablet Take 10 mg by mouth at bedtime.     No current facility-administered medications on file prior to visit.    No Known Allergies  Family History  Problem Relation Age of Onset  . Colon  cancer Father 21  . Stomach cancer Neg Hx   . Rectal cancer Neg Hx   . Esophageal cancer Neg Hx   . Breast cancer Mother   . Breast cancer Father   . Uterine cancer Mother     History   Social History  . Marital Status: Married    Spouse Name: N/A  . Number of Children: 6  . Years of Education: N/A   Occupational History  . nursing student    Social History Main Topics  . Smoking status: Never Smoker   . Smokeless tobacco: Never Used  . Alcohol Use: No  . Drug Use: No     Comment: see triage note  . Sexual Activity: No   Other Topics Concern  . 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  All other ROS are negative.  BP 122/77  mmHg  Pulse 83  Temp(Src) 98.3 F (36.8 C) (Oral)  Resp 16  Ht 5\' 6"  (1.676 m)  Wt 202 lb 8 oz (91.853 kg)  BMI 32.70 kg/m2  SpO2 98%  Physical Exam  Constitutional: She is oriented to person, place, and time and well-developed, well-nourished, and in no distress.  HENT:  Head: Normocephalic and atraumatic.  Eyes: Conjunctivae are normal. Pupils are equal, round, and reactive to light.  Neck: Neck supple.  Cardiovascular: Normal rate, regular rhythm, normal heart sounds and intact distal pulses.   Pulmonary/Chest: Effort normal and breath sounds normal. No respiratory distress. She has no wheezes. She has no rales. She exhibits no tenderness.  Abdominal: Soft. Normal appearance and bowel sounds are normal. There is no tenderness.  Neurological: She is alert and oriented to person, place, and time.  Skin: Skin is warm and dry.     Psychiatric: Affect normal.  Vitals reviewed.  Assessment/Plan: Paronychia Topical ABX as directed. Peroxide soaks.   Referral to podiatry placed.  Gastroesophageal reflux disease without esophagitis Rx Prilosec. GERD diet discussed. Handout given.  Follow-up 1 month.

## 2014-09-05 ENCOUNTER — Encounter: Payer: Self-pay | Admitting: Podiatry

## 2014-09-05 ENCOUNTER — Ambulatory Visit (INDEPENDENT_AMBULATORY_CARE_PROVIDER_SITE_OTHER): Payer: Commercial Managed Care - HMO | Admitting: Podiatry

## 2014-09-05 VITALS — BP 117/79 | HR 81 | Ht 66.0 in | Wt 202.0 lb

## 2014-09-05 DIAGNOSIS — L600 Ingrowing nail: Secondary | ICD-10-CM | POA: Diagnosis not present

## 2014-09-05 DIAGNOSIS — L089 Local infection of the skin and subcutaneous tissue, unspecified: Secondary | ICD-10-CM | POA: Diagnosis not present

## 2014-09-05 DIAGNOSIS — L03012 Cellulitis of left finger: Secondary | ICD-10-CM

## 2014-09-05 DIAGNOSIS — L03039 Cellulitis of unspecified toe: Secondary | ICD-10-CM | POA: Diagnosis not present

## 2014-09-05 MED ORDER — OXYCODONE-ACETAMINOPHEN 10-325 MG PO TABS: 1.0000 | ORAL_TABLET | Freq: Four times a day (QID) | ORAL | Status: DC | PRN

## 2014-09-05 MED ORDER — CEPHALEXIN 500 MG PO CAPS: 500.0000 mg | ORAL_CAPSULE | Freq: Three times a day (TID) | ORAL | Status: DC

## 2014-09-05 NOTE — Progress Notes (Signed)
Subjective:  49 year old female presents with draining ingrown nail left great toe for 6 months.  Patient relates history of having right great toe amputated in 2014 after having ingrown nail surgery and infection. Has had 4 surgeries in ( old scar over area of Bunion, Achilles tendonL, Rt Posterior Tib) since 2011 and healed well.   Objective: Post amputated right great toe. Draining left great toe nail from medial border. Pedal pulses are all palpable. No other gross deformities.  Assessment: Infected ingrown nail left great toe medial border.  Plan: Reviewed clinical findings and available treatment options. Affected toe was anesthetized with total 84ml mixture of 50/50 0.5% Marcaine plain and 1% Xylocaine plain. Affected nail border was reflected with a nail elevator and excised with nail nipper. The wound was cleansed with iodine, dressed with Amerigel ointment compression dressing. Home care instructions and supply dispensed.  Antibiotics and pain medication prescribed. Return as needed.

## 2014-09-05 NOTE — Patient Instructions (Signed)
Temporary nail avulsion done on left great toe. Follow soaking instruction and take antibiotics. If toe is not healing after one week, call the office and return.

## 2014-09-08 ENCOUNTER — Ambulatory Visit: Payer: Self-pay | Admitting: Podiatry

## 2014-09-13 ENCOUNTER — Other Ambulatory Visit: Payer: Self-pay | Admitting: Physician Assistant

## 2014-09-25 ENCOUNTER — Telehealth: Payer: Self-pay | Admitting: *Deleted

## 2014-09-25 NOTE — Telephone Encounter (Signed)
09/25/2014 Dr.Sheard, Patient called this afternoon and states she is in a lot of pain, she ran out of meds 2 to 3 days ago she states , pt had Ingrown Toenail surgery in the last week or so.

## 2014-09-26 ENCOUNTER — Ambulatory Visit (INDEPENDENT_AMBULATORY_CARE_PROVIDER_SITE_OTHER): Payer: Commercial Managed Care - HMO | Admitting: Podiatry

## 2014-09-26 ENCOUNTER — Telehealth: Payer: Self-pay | Admitting: *Deleted

## 2014-09-26 ENCOUNTER — Encounter: Payer: Self-pay | Admitting: Podiatry

## 2014-09-26 VITALS — BP 121/85 | HR 83

## 2014-09-26 DIAGNOSIS — L089 Local infection of the skin and subcutaneous tissue, unspecified: Secondary | ICD-10-CM | POA: Diagnosis not present

## 2014-09-26 MED ORDER — OXYCODONE-ACETAMINOPHEN 10-325 MG PO TABS: 1.0000 | ORAL_TABLET | Freq: Three times a day (TID) | ORAL | Status: DC | PRN

## 2014-09-26 NOTE — Patient Instructions (Signed)
Noted of healed nail bed with macerated skin on the side of big toe. Need to cover with antibiotic cream dressing during the day. Leave it open at night. Return as needed.Marland Kitchen

## 2014-09-26 NOTE — Telephone Encounter (Signed)
09/26/2014 Dr. Caffie Pinto, Call today (am) from [facility]) [phone]  [address]/[employer], Patient has gotten multiple  Prescriptions. The last three were on 1. Percocet (60) 10/325 on 09/05/2014 2. Percocet(30) 525 on 09/18/2014 3.Percocet(30) 525 on 09/21/2014 He turned her away but gave the Prescription back to her.Marland Kitchen

## 2014-09-26 NOTE — Progress Notes (Signed)
Subjective: Patient requested pain medication over the phone. She was told to come in since her last treatment was only a simple nail resection a month ago. She should not have much pain.  Stated that she went to her PCP for pain in the same left great toe. PCP removed whole nail 2 weeks ago. Toe is still hurting.  She noted dark blisters around the toe prior to the nail removal.  Objective: Clean dry nail bed with macerated plantar lateral surface left great toe.  Entire digit is mildly dark red like subsiding erythema.   Assessment: Status post nail surgery, partial and complete left great toe with residual pain. Macerated toe plantar lateral aspect left great toe.   Plan: Continue soak and use antibiotic cream. Rx. Written Percocet 10/325 #10 pills.  Return as needed.

## 2014-09-27 ENCOUNTER — Emergency Department (HOSPITAL_BASED_OUTPATIENT_CLINIC_OR_DEPARTMENT_OTHER)
Admission: EM | Admit: 2014-09-27 | Discharge: 2014-09-27 | Disposition: A | Discharge status: Home / Self Care | Payer: Commercial Managed Care - HMO | Attending: Emergency Medicine | Admitting: Emergency Medicine

## 2014-09-27 ENCOUNTER — Emergency Department (HOSPITAL_BASED_OUTPATIENT_CLINIC_OR_DEPARTMENT_OTHER): Payer: Commercial Managed Care - HMO

## 2014-09-27 ENCOUNTER — Encounter (HOSPITAL_BASED_OUTPATIENT_CLINIC_OR_DEPARTMENT_OTHER): Payer: Self-pay

## 2014-09-27 DIAGNOSIS — Z87442 Personal history of urinary calculi: Secondary | ICD-10-CM | POA: Diagnosis not present

## 2014-09-27 DIAGNOSIS — Z792 Long term (current) use of antibiotics: Secondary | ICD-10-CM | POA: Diagnosis not present

## 2014-09-27 DIAGNOSIS — F419 Anxiety disorder, unspecified: Secondary | ICD-10-CM | POA: Diagnosis not present

## 2014-09-27 DIAGNOSIS — R112 Nausea with vomiting, unspecified: Secondary | ICD-10-CM

## 2014-09-27 DIAGNOSIS — Z8679 Personal history of other diseases of the circulatory system: Secondary | ICD-10-CM | POA: Insufficient documentation

## 2014-09-27 DIAGNOSIS — Z8701 Personal history of pneumonia (recurrent): Secondary | ICD-10-CM | POA: Diagnosis not present

## 2014-09-27 DIAGNOSIS — Z8669 Personal history of other diseases of the nervous system and sense organs: Secondary | ICD-10-CM | POA: Diagnosis not present

## 2014-09-27 DIAGNOSIS — Z8719 Personal history of other diseases of the digestive system: Secondary | ICD-10-CM | POA: Insufficient documentation

## 2014-09-27 DIAGNOSIS — Z3202 Encounter for pregnancy test, result negative: Secondary | ICD-10-CM | POA: Insufficient documentation

## 2014-09-27 DIAGNOSIS — F3177 Bipolar disorder, in partial remission, most recent episode mixed: Secondary | ICD-10-CM | POA: Insufficient documentation

## 2014-09-27 DIAGNOSIS — Z87448 Personal history of other diseases of urinary system: Secondary | ICD-10-CM | POA: Diagnosis not present

## 2014-09-27 DIAGNOSIS — Z79899 Other long term (current) drug therapy: Secondary | ICD-10-CM | POA: Diagnosis not present

## 2014-09-27 LAB — URINALYSIS, ROUTINE W REFLEX MICROSCOPIC
GLUCOSE, UA: NEGATIVE mg/dL
HGB URINE DIPSTICK: NEGATIVE
Ketones, ur: NEGATIVE mg/dL
Nitrite: NEGATIVE
Protein, ur: NEGATIVE mg/dL
Specific Gravity, Urine: 1.031 — ABNORMAL HIGH (ref 1.005–1.030)
Urobilinogen, UA: 0.2 mg/dL (ref 0.0–1.0)
pH: 6.5 (ref 5.0–8.0)

## 2014-09-27 LAB — COMPREHENSIVE METABOLIC PANEL
ALBUMIN: 4.7 g/dL (ref 3.5–5.0)
ALT: 14 U/L (ref 14–54)
AST: 19 U/L (ref 15–41)
Alkaline Phosphatase: 62 U/L (ref 38–126)
Anion gap: 10 (ref 5–15)
BUN: 11 mg/dL (ref 6–20)
CALCIUM: 9.7 mg/dL (ref 8.9–10.3)
CO2: 25 mmol/L (ref 22–32)
CREATININE: 0.93 mg/dL (ref 0.44–1.00)
Chloride: 102 mmol/L (ref 101–111)
GFR calc non Af Amer: >60 mL/min (ref >60)
Glucose, Bld: 108 mg/dL — ABNORMAL HIGH (ref 65–99)
Potassium: 3.7 mmol/L (ref 3.5–5.1)
Sodium: 137 mmol/L (ref 135–145)
Total Bilirubin: 1.1 mg/dL (ref 0.3–1.2)
Total Protein: 7.3 g/dL (ref 6.5–8.1)

## 2014-09-27 LAB — CBC
HCT: 41.4 % (ref 36.0–46.0)
Hemoglobin: 13.7 g/dL (ref 12.0–15.0)
MCH: 29.4 pg (ref 26.0–34.0)
MCHC: 33.1 g/dL (ref 30.0–36.0)
MCV: 88.8 fL (ref 78.0–100.0)
Platelets: 206 10*3/uL (ref 150–400)
RBC: 4.66 MIL/uL (ref 3.87–5.11)
RDW: 14 % (ref 11.5–15.5)
WBC: 6.4 10*3/uL (ref 4.0–10.5)

## 2014-09-27 LAB — URINE MICROSCOPIC-ADD ON

## 2014-09-27 LAB — PREGNANCY, URINE: Preg Test, Ur: NEGATIVE

## 2014-09-27 LAB — LIPASE, BLOOD: LIPASE: 24 U/L (ref 22–51)

## 2014-09-27 MED ORDER — PROMETHAZINE HCL 25 MG/ML IJ SOLN
25.0000 mg | Freq: Once | INTRAMUSCULAR | Status: AC
  Administered 2014-09-27: 25 mg via INTRAVENOUS
  Filled 2014-09-27 (×2): qty 1

## 2014-09-27 MED ORDER — ONDANSETRON HCL 4 MG/2ML IJ SOLN
4.0000 mg | Freq: Once | INTRAMUSCULAR | Status: AC
  Administered 2014-09-27: 4 mg via INTRAVENOUS
  Filled 2014-09-27: qty 2

## 2014-09-27 MED ORDER — PROMETHAZINE HCL 25 MG/ML IJ SOLN
12.5000 mg | Freq: Once | INTRAMUSCULAR | Status: AC
  Administered 2014-09-27: 12.5 mg via INTRAVENOUS

## 2014-09-27 MED ORDER — PROMETHAZINE HCL 25 MG/ML IJ SOLN
INTRAMUSCULAR | Status: AC
  Filled 2014-09-27: qty 1

## 2014-09-27 MED ORDER — SODIUM CHLORIDE 0.9 % IV BOLUS (SEPSIS)
1000.0000 mL | Freq: Once | INTRAVENOUS | Status: AC
  Administered 2014-09-27: 1000 mL via INTRAVENOUS

## 2014-09-27 MED ORDER — PROMETHAZINE HCL 25 MG PO TABS: 25.0000 mg | ORAL_TABLET | Freq: Four times a day (QID) | ORAL | Status: DC | PRN

## 2014-09-27 NOTE — ED Provider Notes (Signed)
CSN: [id number]     Arrival date & time 09/27/14  2005 History   First MD Initiated Contact with Patient 09/27/14 2013     Chief Complaint  Patient presents with  . Emesis     (Consider location/radiation/quality/duration/timing/severity/associated sxs/prior Treatment) HPI Comments: 49 year old female complaining of nausea 3 weeks and vomiting beginning today. Reports about 10 episodes of nonbloody emesis today, initially vomiting food and eventually bile. States she has been on multiple courses of antibiotics for a toe infection being treated by Dr. Sharol Given and is currently on doxycycline. Denies fever or diarrhea. States she has very mild, lower abdominal pain with the vomiting only. Over the past 3 weeks she was started back on Percocet for the toe pain after she had a, off of all of her narcotic pain medications that she was on from pain management. Denies chest pain or shortness of breath.  Patient is a 49 y.o. female presenting with vomiting. The history is provided by the patient.  Emesis Associated symptoms: abdominal pain (mild, with vomiting only)     Past Medical History  Diagnosis Date  . Dysrhythmia     Left sided heart diease  . Mental disorder   . Bipolar affective disorder, mixed, in partial or remission   . Headache(784.0)   . Anxiety   . Depression   . 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 History of gallstones   . PONV (postoperative nausea and vomiting)   . Sleep apnea   . Renal disorder   . Kidney stones   . Pneumonia     Hx: of as a child  . Migraine    Past Surgical History  Procedure Laterality Date  . Vaginal hysterectomy    . Cholecystectomy    . Total abdominal hysterectomy w/ bilateral salpingoophorectomy    . Foot surgery      right x 2  . Colonoscopy      2001, 2008.  Father has colon cancer  . Bunionectomy      11/14/2011  . Abdominal hysterectomy    . 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 Colon cancer Father 98  . Stomach cancer Neg Hx   . Rectal cancer Neg Hx   . Esophageal cancer Neg Hx   . Breast cancer Mother   . Breast cancer Father   . Uterine cancer Mother    History  Substance Use Topics  . Smoking status: Never Smoker   . Smokeless tobacco: Never Used  . Alcohol Use: No   OB History    No data available     Review of Systems  Gastrointestinal: Positive for nausea, vomiting and abdominal pain (mild, with vomiting only).  All other systems reviewed and are negative.     Allergies  Review of patient's allergies indicates no known allergies.  Home Medications   Prior to Admission medications   Medication Sig Start Date End Date Taking? Authorizing Provider  cephALEXin (KEFLEX) 500 MG capsule Take 1 capsule (500 mg total) by mouth 3 (three) times daily. 09/05/14   Myeong O Sheard, DPM  lithium carbonate 300 MG capsule Take 600 mg by mouth at bedtime.  05/12/14   Historical Provider, MD  mupirocin ointment (BACTROBAN) 2 % Apply  to affected area twice daily 09/01/14   Brunetta Jeans, PA-C  omeprazole (PRILOSEC) 20 MG capsule Take 1 capsule (20 mg total) by mouth daily. 09/01/14   Brunetta Jeans, PA-C  oxyCODONE-acetaminophen (PERCOCET) 10-325 MG per tablet Take 1 tablet by mouth every 8 (eight) hours as needed for pain. 09/26/14   Myeong O Sheard, DPM  pregabalin (LYRICA) 50 MG capsule Take 1 capsule (50 mg total) by mouth 2 (two) times daily. For pain 05/24/14   Brunetta Jeans, PA-C  promethazine (PHENERGAN) 25 MG tablet Take 1 tablet (25 mg total) by mouth every 6 (six) hours as needed for nausea or vomiting. 09/27/14   Carman Ching, PA-C  QUEtiapine (SEROQUEL) 400 MG tablet Take 2 tablets (800 mg total) by mouth at bedtime. For mood control 03/15/14   Encarnacion Slates, NP  risperidone (RISPERDAL) 4 MG tablet Take 4 mg by mouth every morning.  05/12/14   Historical Provider, MD  terbinafine (LAMISIL) 250 MG tablet Take 1 tablet (250 mg total) by mouth daily. 05/24/14   Brunetta Jeans, PA-C  traMADol (ULTRAM) 50 MG tablet Take 1 tablet (50 mg total) by mouth every 8 (eight) hours as needed. 09/01/14   Brunetta Jeans, PA-C  traZODone (DESYREL) 100 MG tablet Take 1 tablet (100 mg total) by mouth at bedtime as needed for sleep. 03/15/14   Encarnacion Slates, NP  zolpidem (AMBIEN) 10 MG tablet Take 10 mg by mouth at bedtime.    Historical Provider, MD   BP 119/74 mmHg  Pulse 81  Temp(Src) 98.1 F (36.7 C) (Oral)  Resp 18  Ht 5\' 6"  (1.676 m)  Wt 192 lb (87.091 kg)  BMI 31.00 kg/m2  SpO2 100% Physical Exam  Constitutional: She is oriented to person, place, and time. She appears well-developed and well-nourished. No distress.  HENT:  Head: Normocephalic and atraumatic.  Mouth/Throat: Oropharynx is clear and moist.  Eyes: Conjunctivae and EOM are normal.  Neck: Normal range of motion. Neck supple.  Cardiovascular: Normal rate, regular rhythm and normal heart sounds.   Pulmonary/Chest: Effort normal and breath sounds normal. No respiratory distress.  Abdominal: Soft. Bowel sounds are normal. She exhibits no distension. There is no tenderness. There is no rebound and no guarding.  Musculoskeletal: Normal range of motion. She exhibits no edema.  Neurological: She is alert and oriented to person, place, and time. No sensory deficit.  Skin: Skin is warm and dry.  Psychiatric: She has a normal mood and affect. Her behavior is normal.  Nursing note and vitals reviewed.   ED Course  Procedures (including critical care time) Labs Review Labs Reviewed  URINALYSIS, ROUTINE W REFLEX MICROSCOPIC (NOT AT [facility]) - Abnormal; Notable for the following:    Color, Urine AMBER (*)    Specific Gravity, Urine 1.031 (*)    Bilirubin Urine SMALL (*)    Leukocytes, UA TRACE  (*)    All other components within normal limits  COMPREHENSIVE METABOLIC PANEL - Abnormal; Notable for the following:    Glucose, Bld 108 (*)    All other components within normal limits  PREGNANCY, URINE  CBC  LIPASE, BLOOD  URINE MICROSCOPIC-ADD ON    Imaging Review No results found.   EKG Interpretation None      MDM   Final diagnoses:  Non-intractable vomiting with nausea, vomiting of unspecified type   Nontoxic appearing, NAD. AF VSS. Abdomen is soft and nontender. She only has very mild lower abdominal pain with  vomiting only. The nausea is most likely a side effect of doxycycline along with Percocet. No fevers. Doubt intra-abdominal infection. Given history of cholecystectomy and hysterectomy, acute abdominal series obtained to rule out bowel obstruction. She is having normal bowel movements. X-ray pending. Labs without acute finding. Patient given Zofran with only mild relief of her nausea and is requesting Phenergan. Phenergan given. Patient signed out to Dr. Darl Householder at shift change with x-ray pending. Plan discharge home with a prescription for Phenergan and follow-up with PCP.   Carman Ching, PA-C 09/27/14 2206  Wandra Arthurs, MD 09/27/14 2350

## 2014-09-27 NOTE — Discharge Instructions (Signed)
Take phenergan for severe nausea only. No driving or operating heavy machinery while taking phenergan. This medication may cause drowsiness. Follow up with your primary care doctor.  Nausea and Vomiting Nausea is a sick feeling that often comes before throwing up (vomiting). Vomiting is a reflex where stomach contents come out of your mouth. Vomiting can cause severe loss of body fluids (dehydration). Children and elderly adults can become dehydrated quickly, especially if they also have diarrhea. Nausea and vomiting are symptoms of a condition or disease. It is important to find the cause of your symptoms. CAUSES   Direct irritation of the stomach lining. This irritation can result from increased acid production (gastroesophageal reflux disease), infection, food poisoning, taking certain medicines (such as nonsteroidal anti-inflammatory drugs), alcohol use, or tobacco use.  Signals from the brain.These signals could be caused by a headache, heat exposure, an inner ear disturbance, increased pressure in the brain from injury, infection, a tumor, or a concussion, pain, emotional stimulus, or metabolic problems.  An obstruction in the gastrointestinal tract (bowel obstruction).  Illnesses such as diabetes, hepatitis, gallbladder problems, appendicitis, kidney problems, cancer, sepsis, atypical symptoms of a heart attack, or eating disorders.  Medical treatments such as chemotherapy and radiation.  Receiving medicine that makes you sleep (general anesthetic) during surgery. DIAGNOSIS Your caregiver may ask for tests to be done if the problems do not improve after a few days. Tests may also be done if symptoms are severe or if the reason for the nausea and vomiting is not clear. Tests may include:  Urine tests.  Blood tests.  Stool tests.  Cultures (to look for evidence of infection).  X-rays or other imaging studies. Test results can help your caregiver make decisions about treatment or  the need for additional tests. TREATMENT You need to stay well hydrated. Drink frequently but in small amounts.You may wish to drink water, sports drinks, clear broth, or eat frozen ice pops or gelatin dessert to help stay hydrated.When you eat, eating slowly may help prevent nausea.There are also some antinausea medicines that may help prevent nausea. HOME CARE INSTRUCTIONS   Take all medicine as directed by your caregiver.  If you do not have an appetite, do not force yourself to eat. However, you must continue to drink fluids.  If you have an appetite, eat a normal diet unless your caregiver tells you differently.  Eat a variety of complex carbohydrates (rice, wheat, potatoes, bread), lean meats, yogurt, fruits, and vegetables.  Avoid high-fat foods because they are more difficult to digest.  Drink enough water and fluids to keep your urine clear or pale yellow.  If you are dehydrated, ask your caregiver for specific rehydration instructions. Signs of dehydration may include:  Severe thirst.  Dry lips and mouth.  Dizziness.  Dark urine.  Decreasing urine frequency and amount.  Confusion.  Rapid breathing or pulse. SEEK IMMEDIATE MEDICAL CARE IF:   You have blood or brown flecks (like coffee grounds) in your vomit.  You have black or bloody stools.  You have a severe headache or stiff neck.  You are confused.  You have severe abdominal pain.  You have chest pain or trouble breathing.  You do not urinate at least once every 8 hours.  You develop cold or clammy skin.  You continue to vomit for longer than 24 to 48 hours.  You have a fever. MAKE SURE YOU:   Understand these instructions.  Will watch your condition.  Will get help right away if  if you are not doing well or get worse. °Document Released: 03/03/2005 Document Revised: 05/26/2011 Document Reviewed: 07/31/2010 °ExitCare® Patient Information ©2015 [employer]. This information is not intended to  replace advice given to you by your health care provider. Make sure you discuss any questions you have with your health care provider. ° °

## 2014-09-27 NOTE — ED Notes (Signed)
Vomiting x 3 weeks-denies abd pain and diarrhea

## 2014-11-17 ENCOUNTER — Ambulatory Visit (INDEPENDENT_AMBULATORY_CARE_PROVIDER_SITE_OTHER): Payer: Commercial Managed Care - HMO | Admitting: Physician Assistant

## 2014-11-17 ENCOUNTER — Encounter: Payer: Self-pay | Admitting: Physician Assistant

## 2014-11-17 VITALS — BP 102/70 | HR 91 | Temp 98.1°F | Resp 16 | Ht 66.0 in | Wt 193.5 lb

## 2014-11-17 DIAGNOSIS — R0683 Snoring: Secondary | ICD-10-CM | POA: Diagnosis not present

## 2014-11-17 DIAGNOSIS — J342 Deviated nasal septum: Secondary | ICD-10-CM

## 2014-11-17 MED ORDER — FLUTICASONE PROPIONATE 50 MCG/ACT NA SUSP: 2.0000 | Freq: Every day | NASAL | Status: DC

## 2014-11-17 NOTE — Patient Instructions (Signed)
Please stay hydrated. Start the [facility] daily and begin a daily Claritin. Use Saline nasal spray twice daily as needed to flush out nasal passages. We need to get the swelling down in your nose so ENT can further assess your symptoms.  You will get a phone call from ENT.

## 2014-11-17 NOTE — Assessment & Plan Note (Signed)
With boggy/swollen turbinates making it hard to breathe through nose. Referral placed to ENT. Rx Flonase and begin daily Claritin. Saline nasal spray BID.

## 2014-11-17 NOTE — Progress Notes (Signed)
Pre visit review using our clinic review tool, if applicable. No additional management support is needed unless otherwise documented below in the visit note/SLS  

## 2014-11-17 NOTE — Progress Notes (Signed)
Patient presents to clinic today c/o snoring and nasal congestion associated with rhinorrhea. Endorses history of allergies but not currently taking any medication. Denies fever, chills, ear pressure/pain or sinus pain. Denies apneic episodes or daytime somnolence.  Past Medical History  Diagnosis Date  . Dysrhythmia     Left sided heart diease  . Mental disorder   . Bipolar affective disorder, mixed, in partial or remission   . Headache(784.0)   . Anxiety   . Depression   . 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 History of gallstones   . PONV (postoperative nausea and vomiting)   . Sleep apnea   . Renal disorder   . Kidney stones   . Pneumonia     Hx: of as a child  . Migraine     Current Outpatient Prescriptions on File Prior to Visit  Medication Sig Dispense Refill  . lithium carbonate 300 MG capsule Take 600 mg by mouth at bedtime.     Marland Kitchen omeprazole (PRILOSEC) 20 MG capsule Take 1 capsule (20 mg total) by mouth daily. 30 capsule 1  . pregabalin (LYRICA) 50 MG capsule Take 1 capsule (50 mg total) by mouth 2 (two) times daily. For pain 60 capsule 0  . promethazine (PHENERGAN) 25 MG tablet Take 1 tablet (25 mg total) by mouth every 6 (six) hours as needed for nausea or vomiting. 15 tablet 0  . QUEtiapine (SEROQUEL) 400 MG tablet Take 2 tablets (800 mg total) by mouth at bedtime. For mood control 60 tablet 0  . risperidone (RISPERDAL) 4 MG tablet Take 4 mg by mouth every morning.     . terbinafine (LAMISIL) 250 MG tablet Take 1 tablet (250 mg total) by mouth daily. 30 tablet 0  . traZODone (DESYREL) 100 MG tablet Take 1 tablet (100 mg total) by mouth at bedtime as needed for sleep. 30 tablet 0  . zolpidem (AMBIEN) 10 MG tablet Take 10 mg by mouth at bedtime.     No current facility-administered medications on file prior to visit.    No Known Allergies  Family History  Problem Relation Age of Onset  . Colon cancer Father 29  . Stomach cancer Neg Hx     . Rectal cancer Neg Hx   . Esophageal cancer Neg Hx   . Breast cancer Mother   . Breast cancer Father   . Uterine cancer Mother     Social History   Social History  . Marital Status: Married    Spouse Name: N/A  . Number of Children: 6  . Years of Education: N/A   Occupational History  . nursing student    Social History Main Topics  . Smoking status: Never Smoker   . Smokeless tobacco: Never Used  . Alcohol Use: No  . Drug Use: No     Comment: see triage note  . Sexual Activity: Not Asked   Other Topics Concern  . 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  All other ROS are negative.  BP 102/70 mmHg  Pulse 91  Temp(Src) 98.1 F (36.7 C) (Oral)  Resp 16  Ht 5' 6"  (1.676 m)  Wt 193 lb 8 oz (87.771 kg)  BMI 31.25 kg/m2  SpO2 98%  Physical Exam  Constitutional: She is oriented to person, place, and time and well-developed, well-nourished, and in no distress.  HENT:  Head: Normocephalic and atraumatic.  Right Ear: Tympanic membrane normal.  Left Ear: Tympanic membrane normal.  Nose: Mucosal edema, rhinorrhea and septal deviation present. Right sinus exhibits no maxillary sinus tenderness and no frontal sinus tenderness. Left sinus exhibits no maxillary sinus tenderness and no frontal sinus tenderness.  Mouth/Throat: Uvula is midline, oropharynx is clear and moist and mucous membranes are normal.  Eyes: Conjunctivae are normal.  Cardiovascular: Normal rate, regular rhythm, normal heart sounds and intact distal pulses.   Pulmonary/Chest: Effort normal and breath sounds normal. No respiratory distress. She has no wheezes. She has no rales. She exhibits no tenderness.   Neurological: She is alert and oriented to person, place, and time.  Skin: Skin is warm and dry. No rash noted.  Psychiatric: Affect normal.  Vitals reviewed.   Recent Results (from the past 2160 hour(s))  Pregnancy, urine     Status: None   Collection Time: 09/27/14  8:15 PM  Result Value Ref Range   Preg Test, Ur NEGATIVE NEGATIVE    Comment:        THE SENSITIVITY OF THIS METHODOLOGY IS >20 mIU/mL.   Urinalysis, Routine w reflex microscopic (not at [facility])     Status: Abnormal   Collection Time: 09/27/14  8:15 PM  Result Value Ref Range   Color, Urine AMBER (A) YELLOW    Comment: BIOCHEMICALS MAY BE AFFECTED BY COLOR   APPearance CLEAR CLEAR   Specific Gravity, Urine 1.031 (H) 1.005 - 1.030   pH 6.5 5.0 - 8.0   Glucose, UA NEGATIVE NEGATIVE mg/dL   Hgb urine dipstick NEGATIVE NEGATIVE   Bilirubin Urine SMALL (A) NEGATIVE   Ketones, ur NEGATIVE NEGATIVE mg/dL   Protein, ur NEGATIVE NEGATIVE mg/dL   Urobilinogen, UA 0.2 0.0 - 1.0 mg/dL   Nitrite NEGATIVE NEGATIVE   Leukocytes, UA TRACE (A) NEGATIVE  Urine microscopic-add on     Status: None   Collection Time: 09/27/14  8:15 PM  Result Value Ref Range   Squamous Epithelial / LPF RARE RARE   WBC, UA 0-2 <3 WBC/hpf   Bacteria, UA RARE RARE  CBC     Status: None   Collection Time: 09/27/14  9:00 PM  Result Value Ref Range   WBC 6.4 4.0 - 10.5 K/uL   RBC 4.66 3.87 - 5.11 MIL/uL   Hemoglobin 13.7 12.0 - 15.0 g/dL   HCT 41.4 36.0 - 46.0 %   MCV 88.8 78.0 - 100.0 fL   MCH 29.4 26.0 - 34.0 pg   MCHC 33.1 30.0 - 36.0 g/dL   RDW 14.0 11.5 - 15.5 %   Platelets 206 150 - 400 K/uL  Comprehensive metabolic panel     Status: Abnormal   Collection Time: 09/27/14  9:00 PM  Result Value Ref Range   Sodium 137 135 - 145 mmol/L   Potassium 3.7 3.5 - 5.1 mmol/L   Chloride 102 101 - 111 mmol/L   CO2 25 22 - 32 mmol/L   Glucose, Bld 108 (H) 65 - 99 mg/dL   BUN 11 6 - 20 mg/dL   Creatinine, Ser 0.93 0.44 - 1.00 mg/dL   Calcium 9.7  8.9 - 10.3  mg/dL   Total Protein 7.3 6.5 - 8.1 g/dL   Albumin 4.7 3.5 - 5.0 g/dL   AST 19 15 - 41 U/L   ALT 14 14 - 54 U/L   Alkaline Phosphatase 62 38 - 126 U/L   Total Bilirubin 1.1 0.3 - 1.2 mg/dL   GFR calc non Af Amer >60 >60 mL/min   GFR calc Af Amer >60 >60 mL/min    Comment: (NOTE) The eGFR has been calculated using the CKD EPI equation. This calculation has not been validated in all clinical situations. eGFR's persistently <60 mL/min signify possible Chronic Kidney Disease.    Anion gap 10 5 - 15  Lipase, blood     Status: None   Collection Time: 09/27/14  9:00 PM  Result Value Ref Range   Lipase 24 22 - 51 U/L    Assessment/Plan: Deviated septum With boggy/swollen turbinates making it hard to breathe through nose. Referral placed to ENT. Rx Flonase and begin daily Claritin. Saline nasal spray BID.

## 2014-11-29 ENCOUNTER — Telehealth: Payer: Self-pay | Admitting: Physician Assistant

## 2014-11-29 NOTE — Telephone Encounter (Signed)
Relation to pt: self Call back number: [phone]   Reason for call:  Patient has an appointment today at 1:30pm with Dr. Benjamine Mola ENT [facility] [place], [place], [place] [address] Phone: [phone]. Patient states she LVM and requesting a referral

## 2014-11-29 NOTE — Telephone Encounter (Signed)
Pt aware/placed new auth, prior was for Dr Constance Holster. Lorie Apley for Dr Benjamine Mola [phone]

## 2014-11-29 NOTE — Telephone Encounter (Signed)
insurance Josem Kaufmann #[id number] lm on vm awaiting return call

## 2014-11-29 NOTE — Telephone Encounter (Signed)
error:[id number] ° °

## 2014-11-30 ENCOUNTER — Emergency Department (HOSPITAL_BASED_OUTPATIENT_CLINIC_OR_DEPARTMENT_OTHER)
Admission: EM | Admit: 2014-11-30 | Discharge: 2014-12-01 | Disposition: A | Discharge status: Home / Self Care | Payer: Commercial Managed Care - HMO | Attending: Emergency Medicine | Admitting: Emergency Medicine

## 2014-11-30 ENCOUNTER — Emergency Department (HOSPITAL_BASED_OUTPATIENT_CLINIC_OR_DEPARTMENT_OTHER): Payer: Commercial Managed Care - HMO

## 2014-11-30 ENCOUNTER — Encounter (HOSPITAL_BASED_OUTPATIENT_CLINIC_OR_DEPARTMENT_OTHER): Payer: Self-pay | Admitting: Emergency Medicine

## 2014-11-30 DIAGNOSIS — F419 Anxiety disorder, unspecified: Secondary | ICD-10-CM | POA: Insufficient documentation

## 2014-11-30 DIAGNOSIS — W108XXA Fall (on) (from) other stairs and steps, initial encounter: Secondary | ICD-10-CM | POA: Diagnosis not present

## 2014-11-30 DIAGNOSIS — S62111A Displaced fracture of triquetrum [cuneiform] bone, right wrist, initial encounter for closed fracture: Secondary | ICD-10-CM | POA: Diagnosis not present

## 2014-11-30 DIAGNOSIS — Z79899 Other long term (current) drug therapy: Secondary | ICD-10-CM | POA: Insufficient documentation

## 2014-11-30 DIAGNOSIS — Y9389 Activity, other specified: Secondary | ICD-10-CM | POA: Diagnosis not present

## 2014-11-30 DIAGNOSIS — Y990 Civilian activity done for income or pay: Secondary | ICD-10-CM | POA: Diagnosis not present

## 2014-11-30 DIAGNOSIS — F329 Major depressive disorder, single episode, unspecified: Secondary | ICD-10-CM | POA: Diagnosis not present

## 2014-11-30 DIAGNOSIS — Z7951 Long term (current) use of inhaled steroids: Secondary | ICD-10-CM | POA: Diagnosis not present

## 2014-11-30 DIAGNOSIS — W19XXXA Unspecified fall, initial encounter: Secondary | ICD-10-CM

## 2014-11-30 DIAGNOSIS — Z87448 Personal history of other diseases of urinary system: Secondary | ICD-10-CM | POA: Insufficient documentation

## 2014-11-30 DIAGNOSIS — Z87442 Personal history of urinary calculi: Secondary | ICD-10-CM | POA: Insufficient documentation

## 2014-11-30 DIAGNOSIS — Z8679 Personal history of other diseases of the circulatory system: Secondary | ICD-10-CM | POA: Diagnosis not present

## 2014-11-30 DIAGNOSIS — Z8719 Personal history of other diseases of the digestive system: Secondary | ICD-10-CM | POA: Diagnosis not present

## 2014-11-30 DIAGNOSIS — Y9289 Other specified places as the place of occurrence of the external cause: Secondary | ICD-10-CM | POA: Insufficient documentation

## 2014-11-30 DIAGNOSIS — S6991XA Unspecified injury of right wrist, hand and finger(s), initial encounter: Secondary | ICD-10-CM | POA: Diagnosis present

## 2014-11-30 DIAGNOSIS — S6291XA Unspecified fracture of right wrist and hand, initial encounter for closed fracture: Secondary | ICD-10-CM

## 2014-11-30 NOTE — ED Notes (Signed)
Pt fell while at worked missed a step tried to catch herself bruising and swelling to right hand.

## 2014-11-30 NOTE — ED Provider Notes (Signed)
CSN: [id number]     Arrival date & time 11/30/14  2312 History  This chart was scribed for Nakina Spatz, MD by Tula Nakayama, ED Scribe. This patient was seen in room MH11/MH11 and the patient's care was started at 11:52 PM.    Chief Complaint  Patient presents with  . Hand Injury   Patient is a 49 y.o. female presenting with hand injury. The history is provided by the patient. No language interpreter was used.  Hand Injury Location:  Hand Injury: yes   Mechanism of injury: fall   Fall:    Impact surface:  Hard floor   Point of impact:  Hands   Entrapped after fall: no   Hand location:  R hand Pain details:    Quality:  Aching   Radiates to:  Does not radiate   Severity:  Moderate   Onset quality:  Sudden   Timing:  Constant   Progression:  Unchanged Chronicity:  New Dislocation: no   Foreign body present:  No foreign bodies Relieved by:  Nothing Ineffective treatments:  None tried Associated symptoms: swelling   Associated symptoms: no back pain, no neck pain and no numbness   Risk factors: no concern for non-accidental trauma    HPI Comments: [name] is a 49 y.o. female who presents to the Emergency Department complaining of constant, moderate right hand pain that started earlier today. She states swelling and bruising of her hand as associated symptoms. She has not tried any treatment PTA. Pt reports injury occurred after she missed a step and fell at home. Pt denies anti-coagulant use. She also denies numbness.  Past Medical History  Diagnosis Date  . Dysrhythmia     Left sided heart diease  . Mental disorder   . Bipolar affective disorder, mixed, in partial or remission   . Headache(784.0)   . Anxiety   . Depression   . 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 History of gallstones   . PONV (postoperative nausea and vomiting)   . Sleep apnea   . Renal disorder   . Kidney stones   . Pneumonia     Hx: of as a child  . Migraine     Past Surgical History  Procedure Laterality Date  . Vaginal hysterectomy    . Cholecystectomy    . Total abdominal hysterectomy w/ bilateral salpingoophorectomy    . Foot surgery      right x 2  . Colonoscopy      2001, 2008.  Father has colon cancer  . Bunionectomy      11/14/2011  . Abdominal hysterectomy    . 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 Colon cancer Father 58  . Stomach cancer Neg Hx   . Rectal cancer Neg Hx   . Esophageal cancer Neg Hx   . Breast cancer Mother   . Breast cancer Father   . Uterine cancer Mother    Social History  Substance Use Topics  . Smoking status: Never Smoker   . Smokeless tobacco: Never Used  . Alcohol Use: No   OB History    No data available     Review of Systems  Musculoskeletal: Positive for joint swelling and arthralgias.  Negative for back pain and neck pain.  Skin: Positive for color change.  Neurological: Negative for numbness.  All other systems reviewed and are negative.  Allergies  Review of patient's allergies indicates no known allergies.  Home Medications   Prior to Admission medications   Medication Sig Start Date End Date Taking? Authorizing Provider  fluticasone (FLONASE) 50 MCG/ACT nasal spray Place 2 sprays into both nostrils daily. 11/17/14   Brunetta Jeans, PA-C  lithium carbonate 300 MG capsule Take 600 mg by mouth at bedtime.  05/12/14   Historical Provider, MD  omeprazole (PRILOSEC) 20 MG capsule Take 1 capsule (20 mg total) by mouth daily. 09/01/14   Brunetta Jeans, PA-C  pregabalin (LYRICA) 50 MG capsule Take 1 capsule (50 mg total) by mouth 2 (two) times daily. For pain 05/24/14   Brunetta Jeans, PA-C  promethazine (PHENERGAN) 25 MG tablet Take 1 tablet (25 mg total) by mouth  every 6 (six) hours as needed for nausea or vomiting. 09/27/14   Carman Ching, PA-C  QUEtiapine (SEROQUEL) 400 MG tablet Take 2 tablets (800 mg total) by mouth at bedtime. For mood control 03/15/14   Encarnacion Slates, NP  risperidone (RISPERDAL) 4 MG tablet Take 4 mg by mouth every morning.  05/12/14   Historical Provider, MD  terbinafine (LAMISIL) 250 MG tablet Take 1 tablet (250 mg total) by mouth daily. 05/24/14   Brunetta Jeans, PA-C  traZODone (DESYREL) 100 MG tablet Take 1 tablet (100 mg total) by mouth at bedtime as needed for sleep. 03/15/14   Encarnacion Slates, NP  zolpidem (AMBIEN) 10 MG tablet Take 10 mg by mouth at bedtime.    Historical Provider, MD   BP 118/86 mmHg  Pulse 90  Temp(Src) 98.4 F (36.9 C) (Oral)  Resp 18  Ht 5\' 6"  (1.676 m)  Wt 196 lb (88.905 kg)  BMI 31.65 kg/m2  SpO2 96% Physical Exam  Constitutional: She is oriented to person, place, and time. She appears well-developed and well-nourished. No distress.  HENT:  Head: Normocephalic and atraumatic.  Mouth/Throat: Oropharynx is clear and moist. No oropharyngeal exudate.     Eyes: Conjunctivae and EOM are normal. Pupils are equal, round, and reactive to light.  No battle sign or raccoon eyes  Neck: Normal range of motion. Neck supple. No tracheal deviation present.  Cardiovascular: Normal rate, regular rhythm and normal heart sounds.   Pulmonary/Chest: Effort normal and breath sounds normal. No respiratory distress. She has no wheezes. She has no rales.  Abdominal: Soft. Bowel sounds are normal. There is no tenderness. There is no rebound and no guarding.  Musculoskeletal: Normal range of motion.       Right hand: She exhibits normal range of motion, normal two-point discrimination, normal capillary refill and no laceration. Normal sensation noted. Normal strength noted.  Cap refill less than 2 s 2 in. ecchymosis lateral right hand No step-offs or crepitus Neurovascularly intact 1 in. x 1.5 in. ecchymosis on the  palm over the 4th and 5th metacarpals No snuff box tenderness 2+ radial pulse  Neurological: She is alert and oriented to person, place, and time.  Intact DTR on right upper extremity  Skin: Skin is warm and dry.  Psychiatric: She has a normal mood and affect. Her behavior is normal.  Nursing note and vitals reviewed.   ED Course  Procedures   DIAGNOSTIC STUDIES: Oxygen Saturation is 96% on RA, normal by my interpretation.    COORDINATION OF CARE: 11:56  PM Discussed treatment plan with pt at bedside and pt agreed to plan.  Labs Review Labs Reviewed - No data to display  Imaging Review No results found. I have personally reviewed and evaluated these images and lab results as part of my medical decision-making.   EKG Interpretation None      MDM   Final diagnoses:  Fall   Medications  oxyCODONE-acetaminophen (PERCOCET/ROXICET) 5-325 MG per tablet 1 tablet (1 tablet Oral Given 12/01/14 0020)   Results for orders placed or performed during the hospital encounter of 09/27/14  Pregnancy, urine  Result Value Ref Range   Preg Test, Ur NEGATIVE NEGATIVE  Urinalysis, Routine w reflex microscopic (not at [facility])  Result Value Ref Range   Color, Urine AMBER (A) YELLOW   APPearance CLEAR CLEAR   Specific Gravity, Urine 1.031 (H) 1.005 - 1.030   pH 6.5 5.0 - 8.0   Glucose, UA NEGATIVE NEGATIVE mg/dL   Hgb urine dipstick NEGATIVE NEGATIVE   Bilirubin Urine SMALL (A) NEGATIVE   Ketones, ur NEGATIVE NEGATIVE mg/dL   Protein, ur NEGATIVE NEGATIVE mg/dL   Urobilinogen, UA 0.2 0.0 - 1.0 mg/dL   Nitrite NEGATIVE NEGATIVE   Leukocytes, UA TRACE (A) NEGATIVE  CBC  Result Value Ref Range   WBC 6.4 4.0 - 10.5 K/uL   RBC 4.66 3.87 - 5.11 MIL/uL   Hemoglobin 13.7 12.0 - 15.0 g/dL   HCT 41.4 36.0 - 46.0 %   MCV 88.8 78.0 - 100.0 fL   MCH 29.4 26.0 - 34.0 pg   MCHC 33.1 30.0 - 36.0 g/dL   RDW 14.0 11.5 - 15.5 %   Platelets 206 150 - 400 K/uL  Comprehensive metabolic panel  Result  Value Ref Range   Sodium 137 135 - 145 mmol/L   Potassium 3.7 3.5 - 5.1 mmol/L   Chloride 102 101 - 111 mmol/L   CO2 25 22 - 32 mmol/L   Glucose, Bld 108 (H) 65 - 99 mg/dL   BUN 11 6 - 20 mg/dL   Creatinine, Ser 0.93 0.44 - 1.00 mg/dL   Calcium 9.7 8.9 - 10.3 mg/dL   Total Protein 7.3 6.5 - 8.1 g/dL   Albumin 4.7 3.5 - 5.0 g/dL   AST 19 15 - 41 U/L   ALT 14 14 - 54 U/L   Alkaline Phosphatase 62 38 - 126 U/L   Total Bilirubin 1.1 0.3 - 1.2 mg/dL   GFR calc non Af Amer >60 >60 mL/min   GFR calc Af Amer >60 >60 mL/min   Anion gap 10 5 - 15  Lipase, blood  Result Value Ref Range   Lipase 24 22 - 51 U/L  Urine microscopic-add on  Result Value Ref Range   Squamous Epithelial / LPF RARE RARE   WBC, UA 0-2 <3 WBC/hpf   Bacteria, UA RARE RARE   Dg Wrist Complete Right  12/01/2014   CLINICAL DATA:  Diffuse right wrist pain after fall going up stairs tonight.  EXAM: RIGHT WRIST - COMPLETE 3+ VIEW  COMPARISON:  None.  FINDINGS: Probable nondisplaced triquetrum fracture seen on the lateral view. No additional fracture of the wrist. The alignment and joint spaces are maintained. The scaphoid is intact. There is soft tissue edema.  IMPRESSION: Probable nondisplaced triquetrum fracture.   Electronically Signed   By: Jeb Levering M.D.   On: 12/01/2014 00:33   Dg Hand Complete Right  12/01/2014   CLINICAL DATA:  Fall while going up stairs with right hand  and wrist pain.  EXAM: RIGHT HAND - COMPLETE 3+ VIEW  COMPARISON:  None.  FINDINGS: Standard straight states subtle chip fracture along the articular surface of the head of the fifth metacarpal. Remainder of the exam is unremarkable.  IMPRESSION: Subtle chip fracture along the articular surface of the head of the fifth metacarpal.   Electronically Signed   By: Marin Olp M.D.   On: 12/01/2014 00:32    Ice, elevation, NSAIDs, and a few percocet for severe breakthrough pain.  Follow up with Dr. Amedeo Plenty    I personally performed the services  described in this documentation, which was scribed in my presence. The recorded information has been reviewed and is accurate.     Veatrice Kells, MD 12/01/14 [phone]

## 2014-12-01 ENCOUNTER — Encounter (HOSPITAL_BASED_OUTPATIENT_CLINIC_OR_DEPARTMENT_OTHER): Payer: Self-pay | Admitting: Emergency Medicine

## 2014-12-01 ENCOUNTER — Encounter (HOSPITAL_BASED_OUTPATIENT_CLINIC_OR_DEPARTMENT_OTHER): Payer: Self-pay | Admitting: *Deleted

## 2014-12-01 ENCOUNTER — Ambulatory Visit: Payer: Self-pay | Admitting: Otolaryngology

## 2014-12-01 MED ORDER — OXYCODONE-ACETAMINOPHEN 5-325 MG PO TABS: 1.0000 | ORAL_TABLET | Freq: Once | ORAL | Status: DC

## 2014-12-01 MED ORDER — OXYCODONE-ACETAMINOPHEN 5-325 MG PO TABS
1.0000 | ORAL_TABLET | Freq: Once | ORAL | Status: AC
Start: 2014-12-01 — End: 2014-12-01
  Administered 2014-12-01: 1 via ORAL
  Filled 2014-12-01: qty 1

## 2014-12-01 MED ORDER — OXYCODONE-ACETAMINOPHEN 5-325 MG PO TABS: 1.0000 | ORAL_TABLET | Freq: Four times a day (QID) | ORAL | Status: DC | PRN

## 2014-12-01 NOTE — Discharge Instructions (Signed)

## 2014-12-01 NOTE — ED Notes (Signed)
Pt verbalizes understanding of d/c instructions and denies any further needs at this time. 

## 2014-12-01 NOTE — H&P (Signed)
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 Nasal obstruction (478.19) (J34.89). Bilateral impacted cerumen (380.4) (H61.23). 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 Orders  US Thyroid Ultrasound; Requested for: 30 Nov 2014. Discussed  Chronic nasal obstruction. She did not respond to nasal steroid therapy. She does have a significant anatomic obstruction from the septum and turbinates. Recommend nasal septoplasty with turbinate reduction.   Incidental finding of a thyroid nodule. Recommend ultrasound evaluation.   Severe cerumen impaction cleaned out today. Avoid putting anything in her ears. Reason For Visit  [name] is here today at the kind request of Raiford Noble for consultation and opinion. Headache with sinus drainage. HPI  History of difficulty breathing through her nose on off over the years but especially bad the past year or so. She has tried nasal steroid inhaler without any relief. Antihistamines haven't helped. She has a long history of using Mortimer Fries tends to clean out her ears a regular basis. Allergies  No Known Drug Allergies. Current Meds  Ambien 10 MG Oral Tablet (Zolpidem Tartrate);; RPT Lithium Carbonate 300 MG Oral Capsule;; RPT TraZODone HCl - 100 MG Oral Tablet;; RPT SEROquel XR 400 MG Oral Tablet Extended Release 24 Hour;; RPT RisperiDONE 4 MG Oral Tablet;; RPT. Active Problems  Acute sinusitis   (461.9) (J01.90) Bipolar disorder   (296.80) (F31.9) Chest pain   (786.50) (R07.9) Chest pain   (786.50) (R07.9) Fatigue   (780.79) (R53.83) Routine general medical examination at a health care facility   (V70.0) (Z00.00) Shortness of breath   (786.05) (R06.02). [place]  Cesarean Section Cholecystectomy Foot Surgery Hysterectomy (V88.01) Lithotomy Oral Surgery Tooth Extraction. Family Hx  Family history of malignant neoplasm of breast: Mother,Father (V16.3) (Z80.3) Ovarian ca: Mother  (C56.9). Personal Hx  Never a smoker. ROS  Systemic: Not feeling tired (fatigue).  No fever, no night sweats, and no recent weight loss. Head: Headache. Eyes: No eye symptoms. Otolaryngeal: No hearing loss, no earache, no tinnitus, and no purulent nasal discharge.  No nasal passage blockage (stuffiness).  Snoring.  No sneezing, no hoarseness, and no sore throat. Cardiovascular: No chest pain or discomfort  and no palpitations. Pulmonary: No dyspnea, no cough, and no wheezing. Gastrointestinal: No dysphagia  and no heartburn.  No nausea, no abdominal pain, and no melena.  No diarrhea. Genitourinary: No dysuria. Endocrine: No muscle weakness. Musculoskeletal: No calf muscle cramps, no arthralgias, and no soft tissue swelling. Neurological: No dizziness, no fainting, no tingling, and no numbness. Psychological: No anxiety  and no depression. Skin: No rash. 12 system ROS was obtained and reviewed on the Health Maintenance form dated today.  Positive responses are shown above.  If the symptom is not checked, the patient has denied it. Vital Signs   Recorded by Dan Maker on 30 Nov 2014 01:20 PM BP:120/70,  BSA Calculated: 1.97 ,  BMI Calculated: 31.15 ,  Weight: 193 lb , BMI: 31.2 kg/m2,  Height: 5 ft 6 in. Physical Exam  APPEARANCE: Well developed, well nourished, in no acute distress.  Normal affect, in a pleasant mood.  Oriented to time, place and person. COMMUNICATION: Normal voice   HEAD & FACE:  No scars, lesions or masses of head and face.  Sinuses nontender to palpation.  Salivary glands without mass or tenderness.  Facial strength symmetric.  No facial lesion, scars, or mass. EYES: EOMI with normal primary gaze alignment. Visual acuity grossly intact.  PERRLA EXTERNAL EAR & NOSE: No scars, lesions or masses  EAC &  TYMPANIC MEMBRANE: Severe, bilateral cerumen impaction cleaned out under the microscope. The tympanic membranes are normal bilaterally with good movement to  insufflation. GROSS HEARING: Normal   TMJ:  Nontender  INTRANASAL EXAM: No polyps or purulence. Very large inferior turbinates. Significant S-shaped nasal septal deformity causing obstruction. NASOPHARYNX: Normal, without lesions. LIPS, TEETH & GUMS: No lip lesions, normal dentition and normal gums. ORAL CAVITY/OROPHARYNX:  Oral mucosa moist without lesion or asymmetry of the palate, tongue, tonsil or posterior pharynx. NECK:  Supple without adenopathy or mass. THYROID: Firm, right-sided thyroid mass palpable.  NEUROLOGIC:  No gross CN deficits. No nystagmus noted.   LYMPHATIC:  No enlarged nodes palpable. Procedure  CERUMEN REMOVAL The risks and benefits of this procedure have been thoroughly discussed with the patient/parent.  The most commons risks outlined included but were not limited to: injury of the ear canal or tympanic membrane.  The patient/parent was further informed that there are other less common risks.  The patient/parent  was given the opportunity to ask questions and all such questions were answered to the patient/parent's satisfaction.  Patient/parent  acknowledged the risks and has agreed to proceed.   Procedure: Patient was laid supine and using the microscope cerumen was removed from Both external auditory canal(s) using suction and various instrumentation including wax currettes and suctions. Tolerance: Excellent Unplanned interventions: None  Unplanned events: No complications. Signature  Electronically signed by : Izora Gala  M.D.; 11/30/2014 2:34 PM EST.

## 2014-12-01 NOTE — ED Notes (Signed)
Pt c/o hand injury after falling and landing onto right hand, swelling and bruising to last two knuckles and outer hand

## 2014-12-04 ENCOUNTER — Ambulatory Visit (HOSPITAL_BASED_OUTPATIENT_CLINIC_OR_DEPARTMENT_OTHER): Payer: Commercial Managed Care - HMO | Admitting: Anesthesiology

## 2014-12-04 ENCOUNTER — Ambulatory Visit (HOSPITAL_BASED_OUTPATIENT_CLINIC_OR_DEPARTMENT_OTHER)
Admission: RE | Admit: 2014-12-04 | Discharge: 2014-12-04 | Disposition: A | Discharge status: Home / Self Care | Payer: Commercial Managed Care - HMO | Source: Ambulatory Visit | Attending: Otolaryngology | Admitting: Otolaryngology

## 2014-12-04 ENCOUNTER — Encounter (HOSPITAL_BASED_OUTPATIENT_CLINIC_OR_DEPARTMENT_OTHER): Payer: Self-pay | Admitting: *Deleted

## 2014-12-04 ENCOUNTER — Encounter (HOSPITAL_BASED_OUTPATIENT_CLINIC_OR_DEPARTMENT_OTHER)
Admission: RE | Disposition: A | Discharge status: Home / Self Care | Payer: Self-pay | Source: Ambulatory Visit | Attending: Otolaryngology

## 2014-12-04 ENCOUNTER — Other Ambulatory Visit: Payer: Self-pay | Admitting: Otolaryngology

## 2014-12-04 DIAGNOSIS — J342 Deviated nasal septum: Secondary | ICD-10-CM | POA: Diagnosis not present

## 2014-12-04 DIAGNOSIS — J343 Hypertrophy of nasal turbinates: Secondary | ICD-10-CM | POA: Insufficient documentation

## 2014-12-04 DIAGNOSIS — F319 Bipolar disorder, unspecified: Secondary | ICD-10-CM | POA: Diagnosis not present

## 2014-12-04 DIAGNOSIS — Z6831 Body mass index (BMI) 31.0-31.9, adult: Secondary | ICD-10-CM | POA: Insufficient documentation

## 2014-12-04 DIAGNOSIS — D497 Neoplasm of unspecified behavior of endocrine glands and other parts of nervous system: Secondary | ICD-10-CM

## 2014-12-04 DIAGNOSIS — J3489 Other specified disorders of nose and nasal sinuses: Secondary | ICD-10-CM | POA: Diagnosis not present

## 2014-12-04 HISTORY — PX: NASAL SEPTOPLASTY W/ TURBINOPLASTY: SHX2070

## 2014-12-04 LAB — POCT HEMOGLOBIN-HEMACUE: HEMOGLOBIN: 12 g/dL (ref 12.0–15.0)

## 2014-12-04 SURGERY — SEPTOPLASTY, NOSE, WITH NASAL TURBINATE REDUCTION
Anesthesia: General | Site: Nose | Laterality: Bilateral

## 2014-12-04 MED ORDER — HYDROMORPHONE HCL 1 MG/ML IJ SOLN
INTRAMUSCULAR | Status: AC
Start: 2014-12-04 — End: 2014-12-04
  Filled 2014-12-04: qty 1

## 2014-12-04 MED ORDER — CEPHALEXIN 500 MG PO CAPS: 500.0000 mg | ORAL_CAPSULE | Freq: Three times a day (TID) | ORAL | Status: DC

## 2014-12-04 MED ORDER — GLYCOPYRROLATE 0.2 MG/ML IJ SOLN: 0.2000 mg | Freq: Once | INTRAMUSCULAR | Status: DC | PRN

## 2014-12-04 MED ORDER — OXYMETAZOLINE HCL 0.05 % NA SOLN: 2.0000 | NASAL | Status: DC

## 2014-12-04 MED ORDER — BACITRACIN ZINC 500 UNIT/GM EX OINT
TOPICAL_OINTMENT | CUTANEOUS | Status: DC | PRN
  Administered 2014-12-04: 1 via TOPICAL

## 2014-12-04 MED ORDER — LIDOCAINE-EPINEPHRINE 1 %-1:100000 IJ SOLN
INTRAMUSCULAR | Status: DC | PRN
  Administered 2014-12-04: 7 mL

## 2014-12-04 MED ORDER — MIDAZOLAM HCL 2 MG/2ML IJ SOLN
INTRAMUSCULAR | Status: AC
  Filled 2014-12-04: qty 4

## 2014-12-04 MED ORDER — HYDROCODONE-ACETAMINOPHEN 7.5-325 MG PO TABS: 1.0000 | ORAL_TABLET | Freq: Four times a day (QID) | ORAL | Status: DC | PRN

## 2014-12-04 MED ORDER — PROPOFOL 10 MG/ML IV BOLUS
INTRAVENOUS | Status: DC | PRN
  Administered 2014-12-04: 200 mg via INTRAVENOUS

## 2014-12-04 MED ORDER — HYDROMORPHONE HCL 1 MG/ML IJ SOLN
INTRAMUSCULAR | Status: AC
  Filled 2014-12-04: qty 1

## 2014-12-04 MED ORDER — LIDOCAINE-EPINEPHRINE 1 %-1:100000 IJ SOLN
INTRAMUSCULAR | Status: AC
  Filled 2014-12-04: qty 1

## 2014-12-04 MED ORDER — ONDANSETRON HCL 4 MG/2ML IJ SOLN
INTRAMUSCULAR | Status: DC | PRN
  Administered 2014-12-04: 4 mg via INTRAVENOUS

## 2014-12-04 MED ORDER — PROMETHAZINE HCL 25 MG RE SUPP: 25.0000 mg | Freq: Four times a day (QID) | RECTAL | Status: DC | PRN

## 2014-12-04 MED ORDER — SCOPOLAMINE 1 MG/3DAYS TD PT72
MEDICATED_PATCH | TRANSDERMAL | Status: AC
  Filled 2014-12-04: qty 1

## 2014-12-04 MED ORDER — CEFAZOLIN SODIUM-DEXTROSE 2-3 GM-% IV SOLR
2.0000 g | INTRAVENOUS | Status: AC
  Administered 2014-12-04: 2 g via INTRAVENOUS

## 2014-12-04 MED ORDER — BACITRACIN ZINC 500 UNIT/GM EX OINT
TOPICAL_OINTMENT | CUTANEOUS | Status: AC
  Filled 2014-12-04: qty 28.35

## 2014-12-04 MED ORDER — BACITRACIN ZINC 500 UNIT/GM EX OINT
TOPICAL_OINTMENT | CUTANEOUS | Status: AC
  Filled 2014-12-04: qty 0.9

## 2014-12-04 MED ORDER — OXYMETAZOLINE HCL 0.05 % NA SOLN
NASAL | Status: DC | PRN
Start: 2014-12-04 — End: 2014-12-04
  Administered 2014-12-04: 1 via TOPICAL

## 2014-12-04 MED ORDER — OXYCODONE HCL 5 MG/5ML PO SOLN: 5.0000 mg | Freq: Once | ORAL | Status: DC | PRN

## 2014-12-04 MED ORDER — LACTATED RINGERS IV SOLN
INTRAVENOUS | Status: DC
  Administered 2014-12-04: 09:00:00 via INTRAVENOUS

## 2014-12-04 MED ORDER — FENTANYL CITRATE (PF) 100 MCG/2ML IJ SOLN
50.0000 ug | INTRAMUSCULAR | Status: DC | PRN
  Administered 2014-12-04: 100 ug via INTRAVENOUS

## 2014-12-04 MED ORDER — DEXAMETHASONE SODIUM PHOSPHATE 4 MG/ML IJ SOLN
INTRAMUSCULAR | Status: DC | PRN
Start: 2014-12-04 — End: 2014-12-04
  Administered 2014-12-04: 10 mg via INTRAVENOUS

## 2014-12-04 MED ORDER — OXYMETAZOLINE HCL 0.05 % NA SOLN
NASAL | Status: AC
  Filled 2014-12-04: qty 15

## 2014-12-04 MED ORDER — PHENYLEPHRINE HCL 10 MG/ML IJ SOLN
INTRAMUSCULAR | Status: DC | PRN
  Administered 2014-12-04: 80 ug via INTRAVENOUS

## 2014-12-04 MED ORDER — SUCCINYLCHOLINE CHLORIDE 20 MG/ML IJ SOLN
INTRAMUSCULAR | Status: DC | PRN
  Administered 2014-12-04: 120 mg via INTRAVENOUS

## 2014-12-04 MED ORDER — OXYCODONE HCL 5 MG PO TABS: 5.0000 mg | ORAL_TABLET | Freq: Once | ORAL | Status: DC | PRN

## 2014-12-04 MED ORDER — MIDAZOLAM HCL 2 MG/2ML IJ SOLN
1.0000 mg | INTRAMUSCULAR | Status: DC | PRN
  Administered 2014-12-04: 2 mg via INTRAVENOUS

## 2014-12-04 MED ORDER — HYDROMORPHONE HCL 1 MG/ML IJ SOLN
0.2500 mg | INTRAMUSCULAR | Status: DC | PRN
  Administered 2014-12-04 (×3): 0.5 mg via INTRAVENOUS

## 2014-12-04 MED ORDER — SCOPOLAMINE 1 MG/3DAYS TD PT72
1.0000 | MEDICATED_PATCH | Freq: Once | TRANSDERMAL | Status: DC | PRN
Start: 2014-12-04 — End: 2014-12-04
  Administered 2014-12-04: 1.5 mg via TRANSDERMAL

## 2014-12-04 MED ORDER — LIDOCAINE HCL (PF) 1 % IJ SOLN
INTRAMUSCULAR | Status: AC
  Filled 2014-12-04: qty 30

## 2014-12-04 MED ORDER — FENTANYL CITRATE (PF) 100 MCG/2ML IJ SOLN
INTRAMUSCULAR | Status: AC
  Filled 2014-12-04: qty 4

## 2014-12-04 SURGICAL SUPPLY — 32 items
ATTRACTOMAT 16X20 MAGNETIC DRP (DRAPES) IMPLANT
CANISTER SUCT 1200ML W/VALVE (MISCELLANEOUS) ×2 IMPLANT
COAGULATOR SUCT 8FR VV (MISCELLANEOUS) IMPLANT
DECANTER SPIKE VIAL GLASS SM (MISCELLANEOUS) ×4 IMPLANT
DRSG NASOPORE 8CM (GAUZE/BANDAGES/DRESSINGS) IMPLANT
DRSG TELFA 3X8 NADH (GAUZE/BANDAGES/DRESSINGS) ×2 IMPLANT
ELECT REM PT RETURN 9FT ADLT (ELECTROSURGICAL) ×2
ELECTRODE REM PT RTRN 9FT ADLT (ELECTROSURGICAL) ×1 IMPLANT
GAUZE SPONGE 4X4 16PLY XRAY LF (GAUZE/BANDAGES/DRESSINGS) IMPLANT
GLOVE BIOGEL PI IND STRL 7.0 (GLOVE) ×1 IMPLANT
GLOVE BIOGEL PI INDICATOR 7.0 (GLOVE) ×1
GLOVE ECLIPSE 6.5 STRL STRAW (GLOVE) ×2 IMPLANT
GLOVE ECLIPSE 7.5 STRL STRAW (GLOVE) ×2 IMPLANT
GOWN STRL REUS W/ TWL LRG LVL3 (GOWN DISPOSABLE) ×2 IMPLANT
GOWN STRL REUS W/TWL LRG LVL3 (GOWN DISPOSABLE) ×2
HEMOSTAT SURGICEL .5X2 ABSORB (HEMOSTASIS) IMPLANT
NEEDLE PRECISIONGLIDE 27X1.5 (NEEDLE) ×2 IMPLANT
NS IRRIG 1000ML POUR BTL (IV SOLUTION) IMPLANT
PACK BASIN DAY SURGERY FS (CUSTOM PROCEDURE TRAY) ×2 IMPLANT
PACK ENT DAY SURGERY (CUSTOM PROCEDURE TRAY) ×2 IMPLANT
PATTIES SURGICAL .5 X3 (DISPOSABLE) ×2 IMPLANT
SHEET SILASTIC 8X6X.030 25-30 (MISCELLANEOUS) IMPLANT
SLEEVE SCD COMPRESS KNEE MED (MISCELLANEOUS) ×2 IMPLANT
SPONGE GAUZE 2X2 8PLY STRL LF (GAUZE/BANDAGES/DRESSINGS) ×2 IMPLANT
STRIP CLOSURE SKIN 1/2X4 (GAUZE/BANDAGES/DRESSINGS) IMPLANT
SUT CHROMIC 4 0 P 3 18 (SUTURE) ×2 IMPLANT
SUT ETHILON 3 0 PS 1 (SUTURE) IMPLANT
SUT ETHILON 4 0 CL P 3 (SUTURE) IMPLANT
SUT ETHILON 6 0 P 1 (SUTURE) IMPLANT
SUT PLAIN 4 0 ~~LOC~~ 1 (SUTURE) ×2 IMPLANT
TOWEL OR 17X24 6PK STRL BLUE (TOWEL DISPOSABLE) ×2 IMPLANT
YANKAUER SUCT BULB TIP NO VENT (SUCTIONS) ×2 IMPLANT

## 2014-12-04 NOTE — Anesthesia Preprocedure Evaluation (Signed)
Anesthesia Evaluation  Patient identified by MRN, date of birth, ID band Patient awake    Reviewed: Allergy & Precautions, NPO status , Patient's Chart, lab work & pertinent test results  History of Anesthesia Complications (+) PONV  Airway Mallampati: II  TM Distance: >3 FB Neck ROM: Full    Dental  (+) Teeth Intact, Dental Advisory Given   Pulmonary neg sleep apnea,    breath sounds clear to auscultation       Cardiovascular  Rhythm:Regular Rate:Normal     Neuro/Psych    GI/Hepatic GERD  Medicated and Controlled,  Endo/Other  Morbid obesity  Renal/GU      Musculoskeletal   Abdominal   Peds  Hematology   Anesthesia Other Findings   Reproductive/Obstetrics                             Anesthesia Physical Anesthesia Plan  ASA: II  Anesthesia Plan: General   Post-op Pain Management:    Induction: Intravenous  Airway Management Planned: Oral ETT  Additional Equipment:   Intra-op Plan:   Post-operative Plan: Extubation in OR  Informed Consent: I have reviewed the patients History and Physical, chart, labs and discussed the procedure including the risks, benefits and alternatives for the proposed anesthesia with the patient or authorized representative who has indicated his/her understanding and acceptance.   Dental advisory given  Plan Discussed with: CRNA, Anesthesiologist and Surgeon  Anesthesia Plan Comments:         Anesthesia Quick Evaluation

## 2014-12-04 NOTE — H&P (View-Only) (Signed)
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 Nasal obstruction (478.19) (J34.89). Bilateral impacted cerumen (380.4) (H61.23). 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 Orders  US Thyroid Ultrasound; Requested for: 30 Nov 2014. Discussed  Chronic nasal obstruction. She did not respond to nasal steroid therapy. She does have a significant anatomic obstruction from the septum and turbinates. Recommend nasal septoplasty with turbinate reduction.   Incidental finding of a thyroid nodule. Recommend ultrasound evaluation.   Severe cerumen impaction cleaned out today. Avoid putting anything in her ears. Reason For Visit  [name] is here today at the kind request of Raiford Noble for consultation and opinion. Headache with sinus drainage. HPI  History of difficulty breathing through her nose on off over the years but especially bad the past year or so. She has tried nasal steroid inhaler without any relief. Antihistamines haven't helped. She has a long history of using Mortimer Fries tends to clean out her ears a regular basis. Allergies  No Known Drug Allergies. Current Meds  Ambien 10 MG Oral Tablet (Zolpidem Tartrate);; RPT Lithium Carbonate 300 MG Oral Capsule;; RPT TraZODone HCl - 100 MG Oral Tablet;; RPT SEROquel XR 400 MG Oral Tablet Extended Release 24 Hour;; RPT RisperiDONE 4 MG Oral Tablet;; RPT. Active Problems  Acute sinusitis   (461.9) (J01.90) Bipolar disorder   (296.80) (F31.9) Chest pain   (786.50) (R07.9) Chest pain   (786.50) (R07.9) Fatigue   (780.79) (R53.83) Routine general medical examination at a health care facility   (V70.0) (Z00.00) Shortness of breath   (786.05) (R06.02). [place]  Cesarean Section Cholecystectomy Foot Surgery Hysterectomy (V88.01) Lithotomy Oral Surgery Tooth Extraction. Family Hx  Family history of malignant neoplasm of breast: Mother,Father (V16.3) (Z80.3) Ovarian ca: Mother  (C56.9). Personal Hx  Never a smoker. ROS  Systemic: Not feeling tired (fatigue).  No fever, no night sweats, and no recent weight loss. Head: Headache. Eyes: No eye symptoms. Otolaryngeal: No hearing loss, no earache, no tinnitus, and no purulent nasal discharge.  No nasal passage blockage (stuffiness).  Snoring.  No sneezing, no hoarseness, and no sore throat. Cardiovascular: No chest pain or discomfort  and no palpitations. Pulmonary: No dyspnea, no cough, and no wheezing. Gastrointestinal: No dysphagia  and no heartburn.  No nausea, no abdominal pain, and no melena.  No diarrhea. Genitourinary: No dysuria. Endocrine: No muscle weakness. Musculoskeletal: No calf muscle cramps, no arthralgias, and no soft tissue swelling. Neurological: No dizziness, no fainting, no tingling, and no numbness. Psychological: No anxiety  and no depression. Skin: No rash. 12 system ROS was obtained and reviewed on the Health Maintenance form dated today.  Positive responses are shown above.  If the symptom is not checked, the patient has denied it. Vital Signs   Recorded by Dan Maker on 30 Nov 2014 01:20 PM BP:120/70,  BSA Calculated: 1.97 ,  BMI Calculated: 31.15 ,  Weight: 193 lb , BMI: 31.2 kg/m2,  Height: 5 ft 6 in. Physical Exam  APPEARANCE: Well developed, well nourished, in no acute distress.  Normal affect, in a pleasant mood.  Oriented to time, place and person. COMMUNICATION: Normal voice   HEAD & FACE:  No scars, lesions or masses of head and face.  Sinuses nontender to palpation.  Salivary glands without mass or tenderness.  Facial strength symmetric.  No facial lesion, scars, or mass. EYES: EOMI with normal primary gaze alignment. Visual acuity grossly intact.  PERRLA EXTERNAL EAR & NOSE: No scars, lesions or masses  EAC &  TYMPANIC MEMBRANE: Severe, bilateral cerumen impaction cleaned out under the microscope. The tympanic membranes are normal bilaterally with good movement to  insufflation. GROSS HEARING: Normal   TMJ:  Nontender  INTRANASAL EXAM: No polyps or purulence. Very large inferior turbinates. Significant S-shaped nasal septal deformity causing obstruction. NASOPHARYNX: Normal, without lesions. LIPS, TEETH & GUMS: No lip lesions, normal dentition and normal gums. ORAL CAVITY/OROPHARYNX:  Oral mucosa moist without lesion or asymmetry of the palate, tongue, tonsil or posterior pharynx. NECK:  Supple without adenopathy or mass. THYROID: Firm, right-sided thyroid mass palpable.  NEUROLOGIC:  No gross CN deficits. No nystagmus noted.   LYMPHATIC:  No enlarged nodes palpable. Procedure  CERUMEN REMOVAL The risks and benefits of this procedure have been thoroughly discussed with the patient/parent.  The most commons risks outlined included but were not limited to: injury of the ear canal or tympanic membrane.  The patient/parent was further informed that there are other less common risks.  The patient/parent  was given the opportunity to ask questions and all such questions were answered to the patient/parent's satisfaction.  Patient/parent  acknowledged the risks and has agreed to proceed.   Procedure: Patient was laid supine and using the microscope cerumen was removed from Both external auditory canal(s) using suction and various instrumentation including wax currettes and suctions. Tolerance: Excellent Unplanned interventions: None  Unplanned events: No complications. Signature  Electronically signed by : Izora Gala  M.D.; 11/30/2014 2:34 PM EST.

## 2014-12-04 NOTE — Transfer of Care (Signed)
Immediate Anesthesia Transfer of Care Note  Patient: [name]  Procedure(s) Performed: Procedure(s): BILATERAL NASAL SEPTOPLASTY WITH TURBINATE REDUCTION (Bilateral)  Patient Location: PACU  Anesthesia Type:General  Level of Consciousness: awake and alert   Airway & Oxygen Therapy: Patient Spontanous Breathing and Patient connected to face mask oxygen  Post-op Assessment: Report given to RN and Post -op Vital signs reviewed and stable  Post vital signs: Reviewed and stable  Last Vitals:  Filed Vitals:   12/04/14 0858  BP: 115/70  Pulse: 88  Temp: 36.6 C  Resp: 18    Complications: No apparent anesthesia complications

## 2014-12-04 NOTE — Anesthesia Procedure Notes (Signed)
Procedure Name: Intubation Date/Time: 12/04/2014 9:35 AM Performed by: Melynda Ripple D Pre-anesthesia Checklist: Patient identified, Emergency Drugs available, Suction available and Patient being monitored Patient Re-evaluated:Patient Re-evaluated prior to inductionOxygen Delivery Method: Circle System Utilized Preoxygenation: Pre-oxygenation with 100% oxygen Intubation Type: IV induction Ventilation: Mask ventilation without difficulty LMA Size: 4.0 Tube type: Oral Number of attempts: 1 Airway Equipment and Method: Stylet and Oral airway Placement Confirmation: ETT inserted through vocal cords under direct vision,  positive ETCO2 and breath sounds checked- equal and bilateral Tube secured with: Tape Dental Injury: Teeth and Oropharynx as per pre-operative assessment

## 2014-12-04 NOTE — Op Note (Signed)
OPERATIVE REPORT  DATE OF SURGERY: 12/04/2014  PATIENT:  [name],  49 y.o. female  PRE-OPERATIVE DIAGNOSIS:  DEVIATED NASAL SEPTUM, NASAL OBSTRUCTION, HYPERTROPHY OF TURBINATE  POST-OPERATIVE DIAGNOSIS:  DEVIATED NASAL SEPTUM, NASAL OBSTRUCTION, HYPERTROPHY OF TURBINATE  PROCEDURE:  Procedure(s): BILATERAL NASAL SEPTOPLASTY WITH TURBINATE REDUCTION  SURGEON:  Beckie Salts, MD  ASSISTANTS: None  ANESTHESIA:   General   EBL:  20 ml  DRAINS: None  LOCAL MEDICATIONS USED:  1% Xylocaine with epinephrine  SPECIMEN:  none  COUNTS:  Correct  PROCEDURE DETAILS: The patient was taken to the operating room and placed on the operating table in the supine position. Following induction of general endotracheal anesthesia, the nose was draped in a standard fashion. 1% Xylocaine with epinephrine was infiltrated into the septum, the columella, and the inferior turbinates bilaterally. Afrin-soaked pledgets were placed bilaterally in the nasal cavities.  Nasal septoplasty. A left hemitransfixion incision was created with a 15 scalpel. Mucoperichondrial flap was developed posteriorly down the left side to the sphenoid rostrum. The bony cartilaginous junction was divided and a flap was developed down the right side exposing the entire ethmoid bone. The angulated and curved sections of the ethmoid plate were resected with Jansen-Middleton rongeurs. Part of the vomer was also removed as it was also deflected. The maxillary crest was curved towards the right side. This was also taken down relieving a significant spur on the right side anteriorly. The septal flaps were then reinspected and the septum was lying nicely down the midline. The mucosal incision was reapproximated with chromic suture interrupted. The septal flaps were quilted with plain gut.  Submucous resection inferior turbinates. The leading edge of the inferior turbinates was incised in vertical fashion with a 15 scalpel bilaterally.  Cottle elevator was used to elevate mucosa off bone in all directions. Large bony fragments were removed. Turbinate remnants were outfractured with the Cottle elevator. The nasal cavities were suctioned of blood and secretions and were packed with rolled up Telfa coated with bacitracin ointment. The pharynx was suctioned of blood and secretions. A convexed abated and transferred to recovery in stable condition.    PATIENT DISPOSITION:  To PACU, stable

## 2014-12-04 NOTE — Anesthesia Postprocedure Evaluation (Signed)
  Anesthesia Post-op Note  Patient: [name]  Procedure(s) Performed: Procedure(s): BILATERAL NASAL SEPTOPLASTY WITH TURBINATE REDUCTION (Bilateral)  Patient Location: PACU  Anesthesia Type: General   Level of Consciousness: awake, alert  and oriented  Airway and Oxygen Therapy: Patient Spontanous Breathing  Post-op Pain: moderate  Post-op Assessment: Post-op Vital signs reviewed  Post-op Vital Signs: Reviewed  Last Vitals:  Filed Vitals:   12/04/14 1115  BP: 144/82  Pulse: 90  Temp:   Resp: 7    Complications: No apparent anesthesia complications

## 2014-12-04 NOTE — Discharge Instructions (Signed)
Call your surgeon if you experience:  ° °1.  Fever over 101.0. °2.  Inability to urinate. °3.  Nausea and/or vomiting. °4.  Extreme swelling or bruising at the surgical site. °5.  Continued bleeding from the incision. °6.  Increased pain, redness or drainage from the incision. °7.  Problems related to your pain medication. °8. Any change in color, movement and/or sensation °9. Any problems and/or concerns ° ° ° °Post Anesthesia Home Care Instructions ° °Activity: °Get plenty of rest for the remainder of the day. A responsible adult should stay with you for 24 hours following the procedure.  °For the next 24 hours, DO NOT: °-Drive a car °-Operate machinery °-Drink alcoholic beverages °-Take any medication unless instructed by your physician °-Make any legal decisions or sign important papers. ° °Meals: °Start with liquid foods such as gelatin or soup. Progress to regular foods as tolerated. Avoid greasy, spicy, heavy foods. If nausea and/or vomiting occur, drink only clear liquids until the nausea and/or vomiting subsides. Call your physician if vomiting continues. ° °Special Instructions/Symptoms: °Your throat may feel dry or sore from the anesthesia or the breathing tube placed in your throat during surgery. If this causes discomfort, gargle with warm salt water. The discomfort should disappear within 24 hours. ° °If you had a scopolamine patch placed behind your ear for the management of post- operative nausea and/or vomiting: ° °1. The medication in the patch is effective for 72 hours, after which it should be removed.  Wrap patch in a tissue and discard in the trash. Wash hands thoroughly with soap and water. °2. You may remove the patch earlier than 72 hours if you experience unpleasant side effects which may include dry mouth, dizziness or visual disturbances. °3. Avoid touching the patch. Wash your hands with soap and water after contact with the patch. °  ° °

## 2014-12-04 NOTE — OR Nursing (Signed)
Patient's right hand appears swollen and pink - patient states she fell on Thursday, visited the ER, where they confirmed a right hand fracture x 2.  Patient states she plans to see Dr. Erlinda Hong for a consult tomorrow.  Dr. Constance Holster made aware

## 2014-12-04 NOTE — Interval H&P Note (Signed)
History and Physical Interval Note:  12/04/2014 9:07 AM  [name]  has presented today for surgery, with the diagnosis of DEVIATED NASAL SEPTUM, NASAL OBSTRUCTION, HYPERTROPHY OF TURBINATE  The various methods of treatment have been discussed with the patient and family. After consideration of risks, benefits and other options for treatment, the patient has consented to  Procedure(s): BILATERAL NASAL SEPTOPLASTY WITH TURBINATE REDUCTION (Bilateral) as a surgical intervention .  The patient's history has been reviewed, patient examined, no change in status, stable for surgery.  I have reviewed the patient's chart and labs.  Questions were answered to the patient's satisfaction.     ROSEN, JEFRY

## 2014-12-05 ENCOUNTER — Encounter (HOSPITAL_BASED_OUTPATIENT_CLINIC_OR_DEPARTMENT_OTHER): Payer: Self-pay | Admitting: Otolaryngology

## 2014-12-13 ENCOUNTER — Ambulatory Visit
Admission: RE | Admit: 2014-12-13 | Discharge: 2014-12-13 | Disposition: A | Discharge status: Home / Self Care | Payer: Commercial Managed Care - HMO | Source: Ambulatory Visit | Attending: Otolaryngology | Admitting: Otolaryngology

## 2014-12-13 DIAGNOSIS — D497 Neoplasm of unspecified behavior of endocrine glands and other parts of nervous system: Secondary | ICD-10-CM

## 2015-02-06 ENCOUNTER — Emergency Department (HOSPITAL_BASED_OUTPATIENT_CLINIC_OR_DEPARTMENT_OTHER): Payer: Worker's Compensation

## 2015-02-06 ENCOUNTER — Emergency Department (HOSPITAL_BASED_OUTPATIENT_CLINIC_OR_DEPARTMENT_OTHER)
Admission: EM | Admit: 2015-02-06 | Discharge: 2015-02-06 | Disposition: A | Discharge status: Home / Self Care | Payer: Worker's Compensation | Attending: Emergency Medicine | Admitting: Emergency Medicine

## 2015-02-06 ENCOUNTER — Encounter (HOSPITAL_BASED_OUTPATIENT_CLINIC_OR_DEPARTMENT_OTHER): Payer: Self-pay | Admitting: Emergency Medicine

## 2015-02-06 DIAGNOSIS — Z87442 Personal history of urinary calculi: Secondary | ICD-10-CM | POA: Insufficient documentation

## 2015-02-06 DIAGNOSIS — Z8679 Personal history of other diseases of the circulatory system: Secondary | ICD-10-CM | POA: Diagnosis not present

## 2015-02-06 DIAGNOSIS — G43909 Migraine, unspecified, not intractable, without status migrainosus: Secondary | ICD-10-CM | POA: Insufficient documentation

## 2015-02-06 DIAGNOSIS — Y9389 Activity, other specified: Secondary | ICD-10-CM | POA: Insufficient documentation

## 2015-02-06 DIAGNOSIS — F419 Anxiety disorder, unspecified: Secondary | ICD-10-CM | POA: Diagnosis not present

## 2015-02-06 DIAGNOSIS — Z792 Long term (current) use of antibiotics: Secondary | ICD-10-CM | POA: Insufficient documentation

## 2015-02-06 DIAGNOSIS — W228XXA Striking against or struck by other objects, initial encounter: Secondary | ICD-10-CM | POA: Insufficient documentation

## 2015-02-06 DIAGNOSIS — S60222A Contusion of left hand, initial encounter: Secondary | ICD-10-CM

## 2015-02-06 DIAGNOSIS — Z8701 Personal history of pneumonia (recurrent): Secondary | ICD-10-CM | POA: Diagnosis not present

## 2015-02-06 DIAGNOSIS — S6992XA Unspecified injury of left wrist, hand and finger(s), initial encounter: Secondary | ICD-10-CM | POA: Diagnosis present

## 2015-02-06 DIAGNOSIS — Z79899 Other long term (current) drug therapy: Secondary | ICD-10-CM | POA: Insufficient documentation

## 2015-02-06 DIAGNOSIS — F3177 Bipolar disorder, in partial remission, most recent episode mixed: Secondary | ICD-10-CM | POA: Diagnosis not present

## 2015-02-06 DIAGNOSIS — R52 Pain, unspecified: Secondary | ICD-10-CM

## 2015-02-06 DIAGNOSIS — Y990 Civilian activity done for income or pay: Secondary | ICD-10-CM | POA: Insufficient documentation

## 2015-02-06 DIAGNOSIS — Y9289 Other specified places as the place of occurrence of the external cause: Secondary | ICD-10-CM | POA: Insufficient documentation

## 2015-02-06 MED ORDER — IBUPROFEN 400 MG PO TABS
ORAL_TABLET | ORAL | Status: AC
  Filled 2015-02-06: qty 1

## 2015-02-06 MED ORDER — IBUPROFEN 400 MG PO TABS
600.0000 mg | ORAL_TABLET | Freq: Once | ORAL | Status: AC
  Administered 2015-02-06: 600 mg via ORAL

## 2015-02-06 MED ORDER — IBUPROFEN 200 MG PO TABS
ORAL_TABLET | ORAL | Status: AC
  Filled 2015-02-06: qty 1
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
## 2015-02-06 NOTE — ED Notes (Signed)
When asked [name] denied the need for drug screening r/t employment
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
## 2015-02-22 ENCOUNTER — Ambulatory Visit: Payer: Commercial Managed Care - HMO

## 2015-03-06 ENCOUNTER — Other Ambulatory Visit: Payer: Self-pay

## 2015-03-06 NOTE — Patient Outreach (Signed)
[place] [facility]) Care Management  03/06/2015  [name] 03-22-1965 [id number]   Telephone call to patient regarding humana high risk referral. Unable to reach patient. HIPAA compliant voice message left with call back phone number.  PLAN: RNCM will attempt 2nd telephone outreach call to patient within 1 week.  Quinn Plowman RN,BSN,CCM [place] Coordinator [phone]

## 2015-03-09 ENCOUNTER — Other Ambulatory Visit: Payer: Self-pay

## 2015-03-09 NOTE — Patient Outreach (Signed)
[place] [facility]) Care Management  03/09/2015  [name] 1965/04/11 [id number]   Second telephone call to patient regarding [employer] high risk referral. Unable to reach patient.  HIPAA compliant voice message left with call back phone number.   PLAN: RNCM will attempt 3rd telephone outreach to patient within 1 month.  Quinn Plowman RN,BSN,CCM [place] Coordinator [phone]

## 2015-03-20 ENCOUNTER — Other Ambulatory Visit: Payer: Self-pay

## 2015-03-20 NOTE — Patient Outreach (Signed)
[place] [facility]) Care Management  03/20/2015  [name] Sep 15, 1965 [id number]   Third telephone call to patient regarding [employer] high risk referral.  Unable to reach patient. HIPAA compliant voice message left with call back phone number.  PLAN: RNCM will send patient outreach letter to attempt contact.   Quinn Plowman RN,BSN,CCM [place] Coordinator [phone]

## 2015-03-27 ENCOUNTER — Encounter: Payer: Self-pay | Admitting: Physician Assistant

## 2015-03-27 ENCOUNTER — Ambulatory Visit (INDEPENDENT_AMBULATORY_CARE_PROVIDER_SITE_OTHER): Payer: Commercial Managed Care - HMO | Admitting: Physician Assistant

## 2015-03-27 VITALS — BP 120/78 | HR 96 | Temp 98.4°F | Ht 66.0 in | Wt 202.4 lb

## 2015-03-27 DIAGNOSIS — B9789 Other viral agents as the cause of diseases classified elsewhere: Principal | ICD-10-CM

## 2015-03-27 DIAGNOSIS — J069 Acute upper respiratory infection, unspecified: Secondary | ICD-10-CM | POA: Diagnosis not present

## 2015-03-27 MED ORDER — BENZONATATE 100 MG PO CAPS: 100.0000 mg | ORAL_CAPSULE | Freq: Three times a day (TID) | ORAL | Status: DC | PRN

## 2015-03-27 NOTE — Progress Notes (Signed)
Pre visit review using our clinic review tool, if applicable. No additional management support is needed unless otherwise documented below in the visit note. 

## 2015-03-27 NOTE — Assessment & Plan Note (Signed)
Supportive measures reviewed. Rx Tessalon for cough. Follow-up PRN if symptoms are not resolving. Natural course of Viral URIs discussed with patient.

## 2015-03-27 NOTE — Patient Instructions (Signed)
Please stay well-hydrated and get plenty of rest.  Continue your Mucinex. Start a zinc or echinacea supplement as these can help with viral infections. Take the Tessalon as directed for cough.  Call or return to clinic if symptoms are not resolving in 4-5 days.  Upper Respiratory Infection, Adult Most upper respiratory infections (URIs) are a viral infection of the air passages leading to the lungs. A URI affects the nose, throat, and upper air passages. The most common type of URI is nasopharyngitis and is typically referred to as "the common cold." URIs run their course and usually go away on their own. Most of the time, a URI does not require medical attention, but sometimes a bacterial infection in the upper airways can follow a viral infection. This is called a secondary infection. Sinus and middle ear infections are common types of secondary upper respiratory infections. Bacterial pneumonia can also complicate a URI. A URI can worsen asthma and chronic obstructive pulmonary disease (COPD). Sometimes, these complications can require emergency medical care and may be life threatening.  CAUSES Almost all URIs are caused by viruses. A virus is a type of germ and can spread from one person to another.  RISKS FACTORS You may be at risk for a URI if:   You smoke.   You have chronic heart or lung disease.  You have a weakened defense (immune) system.   You are very young or very old.   You have nasal allergies or asthma.  You work in crowded or poorly ventilated areas.  You work in health care facilities or schools. SIGNS AND SYMPTOMS  Symptoms typically develop 2-3 days after you come in contact with a cold virus. Most viral URIs last 7-10 days. However, viral URIs from the influenza virus (flu virus) can last 14-18 days and are typically more severe. Symptoms may include:   Runny or stuffy (congested) nose.   Sneezing.   Cough.   Sore throat.   Headache.   Fatigue.    Fever.   Loss of appetite.   Pain in your forehead, behind your eyes, and over your cheekbones (sinus pain).  Muscle aches.  DIAGNOSIS  Your health care provider may diagnose a URI by:  Physical exam.  Tests to check that your symptoms are not due to another condition such as:  Strep throat.  Sinusitis.  Pneumonia.  Asthma. TREATMENT  A URI goes away on its own with time. It cannot be cured with medicines, but medicines may be prescribed or recommended to relieve symptoms. Medicines may help:  Reduce your fever.  Reduce your cough.  Relieve nasal congestion. HOME CARE INSTRUCTIONS   Take medicines only as directed by your health care provider.   Gargle warm saltwater or take cough drops to comfort your throat as directed by your health care provider.  Use a warm mist humidifier or inhale steam from a shower to increase air moisture. This may make it easier to breathe.  Drink enough fluid to keep your urine clear or pale yellow.   Eat soups and other clear broths and maintain good nutrition.   Rest as needed.   Return to work when your temperature has returned to normal or as your health care provider advises. You may need to stay home longer to avoid infecting others. You can also use a face mask and careful hand washing to prevent spread of the virus.  Increase the usage of your inhaler if you have asthma.   Do not use any  tobacco products, including cigarettes, chewing tobacco, or electronic cigarettes. If you need help quitting, ask your health care provider. PREVENTION  The best way to protect yourself from getting a cold is to practice good hygiene.   Avoid oral or hand contact with people with cold symptoms.   Wash your hands often if contact occurs.  There is no clear evidence that vitamin C, vitamin E, echinacea, or exercise reduces the chance of developing a cold. However, it is always recommended to get plenty of rest, exercise, and  practice good nutrition.  SEEK MEDICAL CARE IF:   You are getting worse rather than better.   Your symptoms are not controlled by medicine.   You have chills.  You have worsening shortness of breath.  You have brown or red mucus.  You have yellow or brown nasal discharge.  You have pain in your face, especially when you bend forward.  You have a fever.  You have swollen neck glands.  You have pain while swallowing.  You have white areas in the back of your throat. SEEK IMMEDIATE MEDICAL CARE IF:   You have severe or persistent:  Headache.  Ear pain.  Sinus pain.  Chest pain.  You have chronic lung disease and any of the following:  Wheezing.  Prolonged cough.  Coughing up blood.  A change in your usual mucus.  You have a stiff neck.  You have changes in your:  Vision.  Hearing.  Thinking.  Mood. MAKE SURE YOU:   Understand these instructions.  Will watch your condition.  Will get help right away if you are not doing well or get worse.   This information is not intended to replace advice given to you by your health care provider. Make sure you discuss any questions you have with your health care provider.   Document Released: 08/27/2000 Document Revised: 07/18/2014 Document Reviewed: 06/08/2013 Elsevier Interactive Patient Education [employer].

## 2015-03-27 NOTE — Progress Notes (Signed)
Patient presents to clinic today c/o 1.5 days of headache, runny nose and a mild, dry cough. Symptoms have worsened since waking this morning. Denies fever, chills, chest pain or recent travel. Patient has taken Mucinex-DM with some relief of symptoms.  Past Medical History  Diagnosis Date  . Dysrhythmia     Left sided heart diease  . Mental disorder   . Bipolar affective disorder, mixed, in partial or remission   . Headache(784.0)   . Anxiety   . Depression   . 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 History of gallstones   . PONV (postoperative nausea and vomiting)   . Renal disorder   . Kidney stones   . Pneumonia     Hx: of as a child  . Migraine   . Sleep apnea     sleep test 2014, did not have osa    Current Outpatient Prescriptions on File Prior to Visit  Medication Sig Dispense Refill  . lithium carbonate 300 MG capsule Take 600 mg by mouth at bedtime.     . promethazine (PHENERGAN) 25 MG suppository Place 1 suppository (25 mg total) rectally every 6 (six) hours as needed for nausea or vomiting. 12 suppository 1  . promethazine (PHENERGAN) 25 MG tablet Take 1 tablet (25 mg total) by mouth every 6 (six) hours as needed for nausea or vomiting. 15 tablet 0  . QUEtiapine (SEROQUEL) 400 MG tablet Take 2 tablets (800 mg total) by mouth at bedtime. For mood control 60 tablet 0  . risperidone (RISPERDAL) 4 MG tablet Take 4 mg by mouth every morning.     . traZODone (DESYREL) 100 MG tablet Take 1 tablet (100 mg total) by mouth at bedtime as needed for sleep. 30 tablet 0  . zolpidem (AMBIEN) 10 MG tablet Take 10 mg by mouth at bedtime.     No current facility-administered medications on file prior to visit.    No Known Allergies  Family History  Problem Relation Age of Onset  . Colon cancer Father 53  . Stomach cancer Neg Hx   . Rectal cancer Neg Hx   . Esophageal cancer Neg Hx   . Breast cancer Mother   . Breast cancer Father   . Uterine cancer  Mother     Social History   Social History  . Marital Status: Married    Spouse Name: N/A  . Number of Children: 6  . Years of Education: N/A   Occupational History  . nursing student    Social History Main Topics  . Smoking status: Never Smoker   . Smokeless tobacco: Never Used  . Alcohol Use: No  . Drug Use: Yes    Special: Other-see comments, Fentanyl, Hydrocodone     Comment: see triage note  . Sexual Activity: Not Asked   Other Topics Concern  . 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  All other ROS are negative.  BP 120/78 mmHg  Pulse 96  Temp(Src) 98.4 F (36.9 C) (Oral)  Ht 5\' 6"  (1.676 m)  Wt 202 lb 6.4 oz (91.808 kg)  BMI 32.68 kg/m2  SpO2 97%  Physical Exam  Constitutional: She is well-developed, well-nourished, and in no distress.  HENT:  Head: Normocephalic and atraumatic.  Right Ear: External ear normal.  Left Ear: External ear normal.  Nose: Nose normal.  Mouth/Throat: Oropharynx is clear and moist.  TM within normal limits  Eyes: Conjunctivae are normal.  Cardiovascular: Normal rate, regular rhythm, normal heart sounds and intact distal pulses.   Vitals reviewed.  No results found for this or any previous visit (from the past 2160 hour(s)).  Assessment/Plan: Viral URI with cough Supportive measures reviewed. Rx Tessalon for cough. Follow-up PRN if symptoms are not resolving. Natural course of Viral URIs discussed with patient.

## 2015-04-01 ENCOUNTER — Encounter (HOSPITAL_BASED_OUTPATIENT_CLINIC_OR_DEPARTMENT_OTHER): Payer: Self-pay | Admitting: Emergency Medicine

## 2015-04-01 ENCOUNTER — Emergency Department (HOSPITAL_BASED_OUTPATIENT_CLINIC_OR_DEPARTMENT_OTHER)
Admission: EM | Admit: 2015-04-01 | Discharge: 2015-04-01 | Disposition: A | Discharge status: Home / Self Care | Payer: Commercial Managed Care - HMO | Attending: Emergency Medicine | Admitting: Emergency Medicine

## 2015-04-01 ENCOUNTER — Emergency Department (HOSPITAL_BASED_OUTPATIENT_CLINIC_OR_DEPARTMENT_OTHER): Payer: Commercial Managed Care - HMO

## 2015-04-01 DIAGNOSIS — Z79899 Other long term (current) drug therapy: Secondary | ICD-10-CM | POA: Insufficient documentation

## 2015-04-01 DIAGNOSIS — Z8669 Personal history of other diseases of the nervous system and sense organs: Secondary | ICD-10-CM | POA: Diagnosis not present

## 2015-04-01 DIAGNOSIS — Z8679 Personal history of other diseases of the circulatory system: Secondary | ICD-10-CM | POA: Diagnosis not present

## 2015-04-01 DIAGNOSIS — F3177 Bipolar disorder, in partial remission, most recent episode mixed: Secondary | ICD-10-CM | POA: Diagnosis not present

## 2015-04-01 DIAGNOSIS — Z8701 Personal history of pneumonia (recurrent): Secondary | ICD-10-CM | POA: Insufficient documentation

## 2015-04-01 DIAGNOSIS — J40 Bronchitis, not specified as acute or chronic: Secondary | ICD-10-CM | POA: Diagnosis not present

## 2015-04-01 DIAGNOSIS — Z87448 Personal history of other diseases of urinary system: Secondary | ICD-10-CM | POA: Diagnosis not present

## 2015-04-01 DIAGNOSIS — Z87442 Personal history of urinary calculi: Secondary | ICD-10-CM | POA: Diagnosis not present

## 2015-04-01 DIAGNOSIS — R05 Cough: Secondary | ICD-10-CM | POA: Diagnosis present

## 2015-04-01 DIAGNOSIS — F419 Anxiety disorder, unspecified: Secondary | ICD-10-CM | POA: Diagnosis not present

## 2015-04-01 MED ORDER — DM-GUAIFENESIN ER 30-600 MG PO TB12: 1.0000 | ORAL_TABLET | Freq: Two times a day (BID) | ORAL | Status: DC

## 2015-04-01 NOTE — ED Provider Notes (Addendum)
CSN: [id number]     Arrival date & time 04/01/15  [id number] History   First MD Initiated Contact with Patient 04/01/15 1006     Chief Complaint  Patient presents with  . Cough     (Consider location/radiation/quality/duration/timing/severity/associated sxs/prior Treatment) The history is provided by the patient.   50 year old female 1 week history of upper respiratory infection now with persistent cough. Early symptoms of "congestion are improving. No fevers. Patient seen by primary care provider January 10 started on Tessalon no real improvement in the cough. Patient denies fever denies nausea vomiting or diarrhea.  Past Medical History  Diagnosis Date  . Dysrhythmia     Left sided heart diease  . Mental disorder   . Bipolar affective disorder, mixed, in partial or remission   . Headache(784.0)   . Anxiety   . Depression   . 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 History of gallstones   . PONV (postoperative nausea and vomiting)   . Renal disorder   . Kidney stones   . Pneumonia     Hx: of as a child  . Migraine   . Sleep apnea     sleep test 2014, did not have osa   Past Surgical History  Procedure Laterality Date  . Vaginal hysterectomy    . Cholecystectomy    . Total abdominal hysterectomy w/ bilateral salpingoophorectomy    . Foot surgery      right x 2  . Colonoscopy      2001, 2008.  Father has colon cancer  . Bunionectomy      11/14/2011  . Abdominal hysterectomy    . 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 Nasal septoplasty w/ turbinoplasty Bilateral 12/04/2014    Procedure: NASAL SEPTOPLASTY WITH BILATERAL TURBINATE REDUCTION;  Surgeon: Izora Gala, MD;  Location: [facility];  Service: ENT;  Laterality: Bilateral;   Family History  Problem Relation Age of  Onset  . Colon cancer Father 32  . Stomach cancer Neg Hx   . Rectal cancer Neg Hx   . Esophageal cancer Neg Hx   . Breast cancer Mother   . Breast cancer Father   . Uterine cancer Mother    Social History  Substance Use Topics  . Smoking status: Never Smoker   . Smokeless tobacco: Never Used  . Alcohol Use: No   OB History    No data available     Review of Systems  Constitutional: Negative for fever.  HENT: Positive for congestion.   Eyes: Negative for redness.  Respiratory: Positive for shortness of breath.   Cardiovascular: Negative for chest pain.  Gastrointestinal: Negative for nausea, vomiting, abdominal pain and diarrhea.  Genitourinary: Negative for dysuria.  Musculoskeletal: Negative for myalgias.  Skin: Negative for rash.  Neurological: Negative for headaches.  Psychiatric/Behavioral: Negative for confusion.      Allergies  Review of patient's allergies indicates no known allergies.  Home Medications   Prior to Admission medications   Medication Sig Start Date End Date Taking? Authorizing Provider  benzonatate (TESSALON) 100 MG capsule Take 1 capsule (100 mg total) by mouth 3 (three) times daily as needed for cough. 03/27/15  Yes Brunetta Jeans, PA-C  lithium carbonate 300 MG capsule Take 600 mg by mouth at bedtime.  05/12/14  Yes Historical Provider, MD  QUEtiapine (SEROQUEL) 400 MG tablet Take 2 tablets (800 mg total) by mouth at bedtime. For mood control 03/15/14  Yes Encarnacion Slates, NP  risperidone (RISPERDAL) 4 MG tablet Take 4 mg by mouth every morning.  05/12/14  Yes Historical Provider, MD  zolpidem (AMBIEN) 10 MG tablet Take 10 mg by mouth at bedtime.   Yes Historical Provider, MD  dextromethorphan-guaiFENesin (MUCINEX DM) 30-600 MG 12hr tablet Take 1 tablet by mouth 2 (two) times daily. 04/01/15   Fredia Sorrow, MD   BP 125/81 mmHg  Pulse 97  Temp(Src) 98.8 F (37.1 C) (Oral)  Resp 16  Ht 5\' 6"  (1.676 m)  Wt 90.719 kg  BMI 32.30 kg/m2  SpO2  98% Physical Exam  Constitutional: She is oriented to person, place, and time. She appears well-developed and well-nourished. No distress.  HENT:  Head: Normocephalic and atraumatic.  Mouth/Throat: Oropharynx is clear and moist.  Eyes: Conjunctivae and EOM are normal. Pupils are equal, round, and reactive to light.  Neck: Normal range of motion. Neck supple.  Cardiovascular: Normal rate, regular rhythm and normal heart sounds.   Pulmonary/Chest: Effort normal and breath sounds normal. No respiratory distress. She has no wheezes. She has no rales.  Abdominal: Soft. Bowel sounds are normal. There is no tenderness.  Musculoskeletal: Normal range of motion.  No palpable mass in the right axilla.  Neurological: She is alert and oriented to person, place, and time. No cranial nerve deficit. She exhibits normal muscle tone. Coordination normal.  Skin: Skin is warm. No rash noted.  Nursing note and vitals reviewed.   ED Course  Procedures (including critical care time) Labs Review Labs Reviewed - No data to display  Imaging Review Dg Chest 2 View  04/01/2015  CLINICAL DATA:  Nonproductive cough for 1 week EXAM: CHEST  2 VIEW COMPARISON:  09/27/2014 FINDINGS: Normal cardiac silhouette. No effusion, infiltrate, pneumothorax. No acute osseous abnormality. Oblong soft tissue density in the RIGHT axilla measuring 2.5 x 4.5 cm. IMPRESSION: No acute cardiopulmonary process. Oblong soft tissue density in the RIGHT axilla. Recommend physical exam to exclude lymphadenopathy. Electronically Signed   By: Suzy Bouchard M.D.   On: 04/01/2015 11:08   I have personally reviewed and evaluated these images and lab results as part of my medical decision-making.   EKG Interpretation None      MDM   Final diagnoses:  Bronchitis   Patient with history of upper respiratory infection for about a week. Overall symptoms improving but cough has persisted. Patient treated with Tessalon without significant  improvement from her primary care doctor on a visit on January 10. Chest x-rays negative for pneumonia. Patient is nontoxic no acute distress. Room air sats are 98%. Chest x-rays raise concern for a right axillary mass of upper to good size. Not able to palpate anything on exam. Patient's not able to palpate anything either. However will have patient continue with self-exam at home and follow-up with her primary care doctor in the next few days for recheck. Patient will be switched over to a trial of Mucinex DM for the cough which is suggestive of a bronchitis at this point.     Fredia Sorrow, MD 04/01/15 K[place], MD 04/01/15 1123

## 2015-04-01 NOTE — ED Notes (Signed)
MD at bedside. 

## 2015-04-01 NOTE — ED Notes (Signed)
Pt reports cough x 1 week non productive, saw pcp on 1/10, prescribed medication for cough but no improvement

## 2015-04-01 NOTE — Discharge Instructions (Signed)
Chest x-ray negative for pneumonia. Trial of Mucinex DM. Also as we discussed x-ray raises concerns for a mass in the right armpit area. Recommend follow-up with your regular Dr. in further self-exam at home to see if you feel anything. On exam here today nothing palpable. Return for any new or worse symptoms.

## 2015-04-06 ENCOUNTER — Telehealth: Payer: Self-pay | Admitting: Physician Assistant

## 2015-04-06 ENCOUNTER — Encounter: Payer: Self-pay | Admitting: Physician Assistant

## 2015-04-06 ENCOUNTER — Ambulatory Visit (HOSPITAL_BASED_OUTPATIENT_CLINIC_OR_DEPARTMENT_OTHER)
Admission: RE | Admit: 2015-04-06 | Discharge: 2015-04-06 | Disposition: A | Discharge status: Home / Self Care | Payer: Commercial Managed Care - HMO | Source: Ambulatory Visit | Attending: Physician Assistant | Admitting: Physician Assistant

## 2015-04-06 ENCOUNTER — Ambulatory Visit (INDEPENDENT_AMBULATORY_CARE_PROVIDER_SITE_OTHER): Payer: Commercial Managed Care - HMO | Admitting: Physician Assistant

## 2015-04-06 VITALS — BP 107/66 | HR 95 | Temp 98.2°F | Ht 66.0 in | Wt 202.6 lb

## 2015-04-06 DIAGNOSIS — J208 Acute bronchitis due to other specified organisms: Secondary | ICD-10-CM | POA: Diagnosis not present

## 2015-04-06 DIAGNOSIS — R2231 Localized swelling, mass and lump, right upper limb: Secondary | ICD-10-CM

## 2015-04-06 DIAGNOSIS — L600 Ingrowing nail: Secondary | ICD-10-CM

## 2015-04-06 NOTE — Progress Notes (Signed)
Pre visit review using our clinic review tool, if applicable. No additional management support is needed unless otherwise documented below in the visit note. 

## 2015-04-06 NOTE — Telephone Encounter (Signed)
Referral placed.

## 2015-04-06 NOTE — Patient Instructions (Signed)
Please stay hydrated. Continue the cough medication as directed. Symptoms will continue to resolve.  You will be contacted for an ultrasound on your arm to further assess x-ray findings. I will call with these results.

## 2015-04-06 NOTE — Progress Notes (Signed)
Patient presents to clinic today c/o for ER follow-up of viral bronchitis. CXR obtained at that time was negative for CAP but noted a mass in R axillary region. No mass was palpable on exam. Was prescribed Mucinex-DM. Endorses taking Mucinex as directed. Endorses improvement in symptoms. Cough is more infrequent and dry per patient. Denies fever, chills, SOB or fatigue.    Past Medical History  Diagnosis Date  . Dysrhythmia     Left sided heart diease  . Mental disorder   . Bipolar affective disorder, mixed, in partial or remission   . Headache(784.0)   . Anxiety   . Depression   . 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 History of gallstones   . PONV (postoperative nausea and vomiting)   . Renal disorder   . Kidney stones   . Pneumonia     Hx: of as a child  . Migraine   . Sleep apnea     sleep test 2014, did not have osa    Current Outpatient Prescriptions on File Prior to Visit  Medication Sig Dispense Refill  . dextromethorphan-guaiFENesin (MUCINEX DM) 30-600 MG 12hr tablet Take 1 tablet by mouth 2 (two) times daily. 14 tablet 1  . lithium carbonate 300 MG capsule Take 600 mg by mouth at bedtime.     Marland Kitchen QUEtiapine (SEROQUEL) 400 MG tablet Take 2 tablets (800 mg total) by mouth at bedtime. For mood control 60 tablet 0  . risperidone (RISPERDAL) 4 MG tablet Take 4 mg by mouth every morning.     . zolpidem (AMBIEN) 10 MG tablet Take 10 mg by mouth at bedtime.     No current facility-administered medications on file prior to visit.    No Known Allergies  Family History  Problem Relation Age of Onset  . Colon cancer Father 63  . Stomach cancer Neg Hx   . Rectal cancer Neg Hx   . Esophageal cancer Neg Hx   . Breast cancer Mother   . Breast cancer Father   . Uterine cancer Mother     Social History   Social History  . Marital Status: Married    Spouse Name: N/A  . Number of Children: 6  . Years of Education: N/A   Occupational History  .  nursing student    Social History Main Topics  . Smoking status: Never Smoker   . Smokeless tobacco: Never Used  . Alcohol Use: No  . Drug Use: Yes    Special: Other-see comments, Fentanyl, Hydrocodone     Comment: see triage note  . Sexual Activity: Not Asked   Other Topics Concern  . 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  All other ROS are negative.  BP 107/66 mmHg  Pulse 95  Temp(Src) 98.2 F (36.8 C) (Oral)  Ht 5\' 6"  (1.676 m)  Wt 202 lb 9.6 oz (91.899 kg)  BMI 32.72 kg/m2  SpO2 99%  Physical Exam  Constitutional: She is oriented to person, place, and time and well-developed, well-nourished, and in no distress.  HENT:  Head: Normocephalic and atraumatic.  Right Ear: External ear normal.  Left Ear: External ear normal.  Nose: Nose normal.  Mouth/Throat: Oropharynx is clear and moist.  Eyes: Conjunctivae are normal.  Neck: Neck supple.  Cardiovascular: Normal rate, regular rhythm, normal heart sounds and intact distal pulses.   Pulmonary/Chest: Effort normal and breath sounds normal. No respiratory distress. She has no wheezes. She has no rales. She exhibits no tenderness.  Lymphadenopathy:    She has no axillary adenopathy.  Neurological: She is alert and oriented to person, place, and time.  Skin: Skin is warm and dry. No rash noted.  Psychiatric: Affect normal.  Vitals reviewed.   No results found for this or any previous visit (from the past 2160 hour(s)).  Assessment/Plan: Mass of arm Non-palpable on exam. Asymptomatic. Will obtain US to further assess.  Viral bronchitis Resolving. Increase hydration. Continue Mucinex. Follow-up if symptoms do not completely  resolve.

## 2015-04-06 NOTE — Telephone Encounter (Signed)
Relation to [id number] Call back number:[phone] Pharmacy:  Reason for call:  Patient requesting a referral to [facility] due to a ingrown toenail

## 2015-04-09 DIAGNOSIS — R2230 Localized swelling, mass and lump, unspecified upper limb: Secondary | ICD-10-CM | POA: Insufficient documentation

## 2015-04-09 DIAGNOSIS — J208 Acute bronchitis due to other specified organisms: Secondary | ICD-10-CM | POA: Insufficient documentation

## 2015-04-09 NOTE — Assessment & Plan Note (Signed)
Resolving. Increase hydration. Continue Mucinex. Follow-up if symptoms do not completely resolve.

## 2015-04-09 NOTE — Assessment & Plan Note (Signed)
Non-palpable on exam. Asymptomatic. Will obtain US to further assess.

## 2015-04-10 ENCOUNTER — Ambulatory Visit (INDEPENDENT_AMBULATORY_CARE_PROVIDER_SITE_OTHER): Payer: Commercial Managed Care - HMO | Admitting: Podiatry

## 2015-04-10 ENCOUNTER — Encounter: Payer: Self-pay | Admitting: Podiatry

## 2015-04-10 VITALS — BP 137/81 | HR 90 | Ht 66.0 in | Wt 202.0 lb

## 2015-04-10 DIAGNOSIS — L600 Ingrowing nail: Secondary | ICD-10-CM | POA: Diagnosis not present

## 2015-04-10 DIAGNOSIS — L089 Local infection of the skin and subcutaneous tissue, unspecified: Secondary | ICD-10-CM

## 2015-04-10 MED ORDER — OXYCODONE-ACETAMINOPHEN 10-325 MG PO TABS: 1.0000 | ORAL_TABLET | ORAL | Status: DC | PRN

## 2015-04-10 MED ORDER — CEPHALEXIN 500 MG PO CAPS: 500.0000 mg | ORAL_CAPSULE | Freq: Three times a day (TID) | ORAL | Status: DC

## 2015-04-10 NOTE — Progress Notes (Signed)
Subjective:  50 year old female presents with inflamed ingrown nail left great toe.  Patient has had the same problem 6 months ago and had temporary procedure.   Past history includes, having right great toe amputated in 2014 after having ingrown nail surgery and infection.  Has had 4 surgeries ( old scar over area of Bunion, Achilles tendonL, Rt Posterior Tib) since 2011 and healed well.   Objective: Post amputated right great toe. Inflamed left great toe nail medial border. No ascending cellulitis noted. Pedal pulses are all palpable. No other gross deformities.  Assessment: Infected ingrown nail left great toe medial border.  Plan: Reviewed clinical findings and available treatment options. Affected left great toe was anesthetized with total 81ml mixture of 50/50 0.5% Marcaine plain and 1% Xylocaine plain. Affected medial border of left great toe was reflected with a nail elevator and excised with nail nipper. Proximal nail matrix tissue was cauterized with Phenol soaked cotton applicator x 4 and neutralized with Alcohol soaked cotton applicator. The wound was dressed with Amerigel ointment dressing. Home care instructions and supply dispensed.  Return in 1 week for follow up.  Antibiotics and pain medication prescribed. Return in one week.

## 2015-04-10 NOTE — Patient Instructions (Signed)
Ingrown nail surgery was done. Follow soaking instruction.  Some redness and drainage is expected. Call the office if the area gets feverish with increased redness and drainage. 

## 2015-04-11 ENCOUNTER — Encounter (HOSPITAL_BASED_OUTPATIENT_CLINIC_OR_DEPARTMENT_OTHER): Payer: Self-pay

## 2015-04-11 ENCOUNTER — Emergency Department (HOSPITAL_BASED_OUTPATIENT_CLINIC_OR_DEPARTMENT_OTHER)
Admission: EM | Admit: 2015-04-11 | Discharge: 2015-04-11 | Disposition: A | Discharge status: Home / Self Care | Payer: Commercial Managed Care - HMO | Attending: Emergency Medicine | Admitting: Emergency Medicine

## 2015-04-11 DIAGNOSIS — F419 Anxiety disorder, unspecified: Secondary | ICD-10-CM | POA: Diagnosis not present

## 2015-04-11 DIAGNOSIS — Z79899 Other long term (current) drug therapy: Secondary | ICD-10-CM | POA: Insufficient documentation

## 2015-04-11 DIAGNOSIS — Z8669 Personal history of other diseases of the nervous system and sense organs: Secondary | ICD-10-CM | POA: Insufficient documentation

## 2015-04-11 DIAGNOSIS — Z8701 Personal history of pneumonia (recurrent): Secondary | ICD-10-CM | POA: Diagnosis not present

## 2015-04-11 DIAGNOSIS — F99 Mental disorder, not otherwise specified: Secondary | ICD-10-CM | POA: Diagnosis not present

## 2015-04-11 DIAGNOSIS — Z87442 Personal history of urinary calculi: Secondary | ICD-10-CM | POA: Insufficient documentation

## 2015-04-11 DIAGNOSIS — Z87448 Personal history of other diseases of urinary system: Secondary | ICD-10-CM | POA: Insufficient documentation

## 2015-04-11 DIAGNOSIS — F3177 Bipolar disorder, in partial remission, most recent episode mixed: Secondary | ICD-10-CM | POA: Insufficient documentation

## 2015-04-11 DIAGNOSIS — R112 Nausea with vomiting, unspecified: Secondary | ICD-10-CM | POA: Diagnosis not present

## 2015-04-11 DIAGNOSIS — M79674 Pain in right toe(s): Secondary | ICD-10-CM | POA: Insufficient documentation

## 2015-04-11 DIAGNOSIS — Z8719 Personal history of other diseases of the digestive system: Secondary | ICD-10-CM | POA: Insufficient documentation

## 2015-04-11 MED ORDER — OXYCODONE-ACETAMINOPHEN 5-325 MG PO TABS
1.0000 | ORAL_TABLET | Freq: Once | ORAL | Status: AC
  Administered 2015-04-11: 1 via ORAL
  Filled 2015-04-11: qty 1

## 2015-04-11 MED ORDER — PROMETHAZINE HCL 25 MG/ML IJ SOLN
25.0000 mg | Freq: Once | INTRAMUSCULAR | Status: AC
  Administered 2015-04-11: 25 mg via INTRAVENOUS
  Filled 2015-04-11: qty 1

## 2015-04-11 MED ORDER — PROMETHAZINE HCL 25 MG RE SUPP: 25.0000 mg | Freq: Four times a day (QID) | RECTAL | Status: DC | PRN

## 2015-04-11 MED ORDER — SODIUM CHLORIDE 0.9 % IV BOLUS (SEPSIS)
1000.0000 mL | Freq: Once | INTRAVENOUS | Status: AC
  Administered 2015-04-11: 1000 mL via INTRAVENOUS

## 2015-04-11 MED ORDER — MORPHINE SULFATE (PF) 4 MG/ML IV SOLN
4.0000 mg | Freq: Once | INTRAVENOUS | Status: AC
  Administered 2015-04-11: 4 mg via INTRAVENOUS
  Filled 2015-04-11: qty 1

## 2015-04-11 NOTE — Discharge Instructions (Signed)
Push fluids: take small frequent sips of water or Gatorade, do not drink any soda, juice or caffeinated beverages.   ° °Slowly resume solid diet as desired. Avoid food that are spicy, contain dairy and/or have high fat content. ° °Aviod NSAIDs (aspirin, motrin, ibuprofen, naproxen, Aleve et cetera) for pain control because they will irritate your stomach. ° °Please follow with your primary care doctor in the next 2 days for a check-up. They must obtain records for further management.  ° °Do not hesitate to return to the Emergency Department for any new, worsening or concerning symptoms.  ° ° °Nausea and Vomiting °Nausea is a sick feeling that often comes before throwing up (vomiting). Vomiting is a reflex where stomach contents come out of your mouth. Vomiting can cause severe loss of body fluids (dehydration). Children and elderly adults can become dehydrated quickly, especially if they also have diarrhea. Nausea and vomiting are symptoms of a condition or disease. It is important to find the cause of your symptoms. °CAUSES  °· Direct irritation of the stomach lining. This irritation can result from increased acid production (gastroesophageal reflux disease), infection, food poisoning, taking certain medicines (such as nonsteroidal anti-inflammatory drugs), alcohol use, or tobacco use. °· Signals from the brain. These signals could be caused by a headache, heat exposure, an inner ear disturbance, increased pressure in the brain from injury, infection, a tumor, or a concussion, pain, emotional stimulus, or metabolic problems. °· An obstruction in the gastrointestinal tract (bowel obstruction). °· Illnesses such as diabetes, hepatitis, gallbladder problems, appendicitis, kidney problems, cancer, sepsis, atypical symptoms of a heart attack, or eating disorders. °· Medical treatments such as chemotherapy and radiation. °· Receiving medicine that makes you sleep (general anesthetic) during surgery. °DIAGNOSIS °Your  caregiver may ask for tests to be done if the problems do not improve after a few days. Tests may also be done if symptoms are severe or if the reason for the nausea and vomiting is not clear. Tests may include: °· Urine tests. °· Blood tests. °· Stool tests. °· Cultures (to look for evidence of infection). °· X-rays or other imaging studies. °Test results can help your caregiver make decisions about treatment or the need for additional tests. °TREATMENT °You need to stay well hydrated. Drink frequently but in small amounts. You may wish to drink water, sports drinks, clear broth, or eat frozen ice pops or gelatin dessert to help stay hydrated. When you eat, eating slowly may help prevent nausea. There are also some antinausea medicines that may help prevent nausea. °HOME CARE INSTRUCTIONS  °· Take all medicine as directed by your caregiver. °· If you do not have an appetite, do not force yourself to eat. However, you must continue to drink fluids. °· If you have an appetite, eat a normal diet unless your caregiver tells you differently. °¨ Eat a variety of complex carbohydrates (rice, wheat, potatoes, bread), lean meats, yogurt, fruits, and vegetables. °¨ Avoid high-fat foods because they are more difficult to digest. °· Drink enough water and fluids to keep your urine clear or pale yellow. °· If you are dehydrated, ask your caregiver for specific rehydration instructions. Signs of dehydration may include: °¨ Severe thirst. °¨ Dry lips and mouth. °¨ Dizziness. °¨ Dark urine. °¨ Decreasing urine frequency and amount. °¨ Confusion. °¨ Rapid breathing or pulse. °SEEK IMMEDIATE MEDICAL CARE IF:  °· You have blood or brown flecks (like coffee grounds) in your vomit. °· You have black or bloody stools. °· You have a severe headache or   stiff neck. °· You are confused. °· You have severe abdominal pain. °· You have chest pain or trouble breathing. °· You do not urinate at least once every 8 hours. °· You develop cold or  clammy skin. °· You continue to vomit for longer than 24 to 48 hours. °· You have a fever. °MAKE SURE YOU:  °· Understand these instructions. °· Will watch your condition. °· Will get help right away if you are not doing well or get worse. °  °This information is not intended to replace advice given to you by your health care provider. Make sure you discuss any questions you have with your health care provider. °  °Document Released: 03/03/2005 Document Revised: 05/26/2011 Document Reviewed: 07/31/2010 °Elsevier Interactive Patient Education ©2016 [employer] ° °

## 2015-04-11 NOTE — ED Notes (Signed)
IV pump alarming, "pt side occlusion". Pt assisted to reposition her arm to facilitate iv flow, iv is positional. Pt encouraged to keep arm straight to facilitate flow.

## 2015-04-11 NOTE — ED Notes (Signed)
Pt given Ginger Ale = denies nausea.

## 2015-04-11 NOTE — ED Provider Notes (Signed)
CSN: [id number]     Arrival date & time 04/11/15  1347 History   First MD Initiated Contact with Patient 04/11/15 1405     Chief Complaint  Patient presents with  . Toe Pain     (Consider location/radiation/quality/duration/timing/severity/associated sxs/prior Treatment) HPI Comments: Patient is a 50 year old female with a history of bipolar disease, depression, substance abuse, kidney stones who presents today for recurrent vomiting and nausea. Patient states that she had her left toenail removed yesterday for an ingrown toenail. She was then placed on Keflex and Percocet which she started taking when she got home. Since that time she's vomited approximately 15 times with ongoing nausea despite taking Zofran. She attempted to take medications this morning and vomited them up. Patient denies any sick contacts or recent travel. Other than antibiotic she started last night she's had no other antibiotics. She denies any diarrhea. For the procedure she just had local anesthesia but was not under general anesthesia  The history is provided by the patient.    Past Medical History  Diagnosis Date  . Dysrhythmia     Left sided heart diease  . Mental disorder   . Bipolar affective disorder, mixed, in partial or remission   . Headache(784.0)   . Anxiety   . Depression   . 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 History of gallstones   . PONV (postoperative nausea and vomiting)   . Renal disorder   . Kidney stones   . Pneumonia     Hx: of as a child  . Migraine   . Sleep apnea     sleep test 2014, did not have osa   Past Surgical History  Procedure Laterality Date  . Vaginal hysterectomy    . Cholecystectomy    . Total abdominal hysterectomy w/ bilateral salpingoophorectomy    . Foot surgery      right x 2  . Colonoscopy      2001, 2008.  Father has colon cancer  . Bunionectomy      11/14/2011  . Abdominal hysterectomy    . 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 Nasal septoplasty w/ turbinoplasty Bilateral 12/04/2014    Procedure: NASAL SEPTOPLASTY WITH BILATERAL TURBINATE REDUCTION;  Surgeon: Izora Gala, MD;  Location: [facility];  Service: ENT;  Laterality: Bilateral;   Family History  Problem Relation Age of Onset  . Colon cancer Father 60  . Stomach cancer Neg Hx   . Rectal cancer Neg Hx   . Esophageal cancer Neg Hx   . Breast cancer Mother   . Breast cancer Father   . Uterine cancer Mother    Social History  Substance Use Topics  . Smoking status: Never Smoker   . Smokeless tobacco: Never Used  . Alcohol Use: No   OB History    No data available     Review of Systems  All other systems reviewed and are negative.     Allergies  Review of patient's allergies indicates no known allergies.  Home Medications   Prior to Admission medications   Medication Sig Start Date End Date Taking? Authorizing Provider  cephALEXin (KEFLEX) 500 MG capsule Take 1 capsule (500 mg total) by mouth 3 (three) times daily. 04/10/15  Yes Myeong Roxine Caddy, DPM  lithium carbonate  300 MG capsule Take 600 mg by mouth at bedtime.  05/12/14  Yes Historical Provider, MD  oxyCODONE-acetaminophen (PERCOCET) 10-325 MG tablet Take 1 tablet by mouth every 4 (four) hours as needed for pain. 04/10/15  Yes Myeong O Sheard, DPM  QUEtiapine (SEROQUEL) 400 MG tablet Take 2 tablets (800 mg total) by mouth at bedtime. For mood control 03/15/14  Yes Encarnacion Slates, NP  risperidone (RISPERDAL) 4 MG tablet Take 4 mg by mouth every morning.  05/12/14  Yes Historical Provider, MD  zolpidem (AMBIEN) 10 MG tablet Take 10 mg by mouth at bedtime.   Yes Historical Provider, MD  benzonatate (TESSALON) 100 MG capsule  03/27/15   Historical Provider, MD  dextromethorphan-guaiFENesin (MUCINEX  DM) 30-600 MG 12hr tablet Take 1 tablet by mouth 2 (two) times daily. 04/01/15   Fredia Sorrow, MD   BP 117/86 mmHg  Pulse 90  Temp(Src) 98.5 F (36.9 C) (Oral)  Resp 18  Ht 5\' 6"  (1.676 m)  Wt 200 lb (90.719 kg)  BMI 32.30 kg/m2  SpO2 100% Physical Exam  Constitutional: She is oriented to person, place, and time. She appears well-developed and well-nourished. No distress.  HENT:  Head: Normocephalic and atraumatic.  Mouth/Throat: Oropharynx is clear and moist. Mucous membranes are dry.  Eyes: Conjunctivae and EOM are normal. Pupils are equal, round, and reactive to light.  Neck: Normal range of motion. Neck supple.  Cardiovascular: Normal rate, regular rhythm and intact distal pulses.   No murmur heard. Pulmonary/Chest: Effort normal and breath sounds normal. No respiratory distress. She has no wheezes. She has no rales.  Abdominal: Soft. She exhibits no distension. There is no tenderness. There is no rebound and no guarding.  Musculoskeletal: Normal range of motion. She exhibits no edema.  Left toe is wrapped with Coban and without evidence of dressing being soiled. No swelling to the distal toe  Neurological: She is alert and oriented to person, place, and time.  Skin: Skin is warm and dry. No rash noted. No erythema.  Psychiatric: She has a normal mood and affect. Her behavior is normal.  Nursing note and vitals reviewed.   ED Course  Procedures (including critical care time) Labs Review Labs Reviewed - No data to display  Imaging Review No results found. I have personally reviewed and evaluated these images and lab results as part of my medical decision-making.   EKG Interpretation None      MDM   Final diagnoses:  None    Patient is a 50 year old female presenting today with approximate 15 episodes of vomiting after a procedure yesterday where she had an ingrown toenail removed. Patient has been taking Keflex and Percocet but since that time she has had  recurrent vomiting. She states she's taken both medications in the past without having vomiting. She denies any abdominal pain and no recent sick contacts. No diarrhea and no abdominal pain on exam.  Patient had attempted to take Zofran at home without improvement in symptoms.  Will give patient IV fluids and nausea medication.  3:19 PM Pt checked out to [place], MD 04/11/15 [phone]

## 2015-04-11 NOTE — ED Notes (Addendum)
Pt reports 2 day history of left great toe pain, emesis - procedure yesterday to remove toe nail. Pt started on Keflex yesterday. Pt states she was given Percocet for pain but cannot keep it down.

## 2015-04-11 NOTE — ED Notes (Signed)
Pt sts she is unable to drink anything at this time due to feeling nauseated.  PA notified.

## 2015-04-11 NOTE — ED Provider Notes (Signed)
PROGRESS NOTE                                                                                                                 This is a sign-out from Dr. Maryan Rued at shift change: [name] is a 50 y.o. female presenting with exacerbation of pain from toe nail removal yesterday and several episodes of emesis. Plan is to follow-up by mouth challenge and discharged home with Phenergan Please refer to previous note for full HPI, ROS, PMH and PE.   4 PM: Patient is asking for more pain and nausea medications, she refuses to attempts oral fluids. Evaluated the patient at bedside, abdominal exam is benign. Explained to her that she will need to transition to by mouth medications and we will not be giving her anymore narcotic pain medications via IV. Encouraged her to try to have some fluids by mouth.  Patient refuses to try take oral fluids however she has been able to keep down the Percocet without issue.  Filed Vitals:   04/11/15 1630 04/11/15 1645 04/11/15 1700 04/11/15 1723  BP: 125/84  128/83 110/87  Pulse: 66 76 67 75  Temp:      TempSrc:      Resp:    16  Height:      Weight:      SpO2: 98% 99% 98% 97%    Medications  sodium chloride 0.9 % bolus 1,000 mL (0 mLs Intravenous Stopped 04/11/15 1700)  promethazine (PHENERGAN) injection 25 mg (25 mg Intravenous Given 04/11/15 1446)  morphine 4 MG/ML injection 4 mg (4 mg Intravenous Given 04/11/15 1446)  oxyCODONE-acetaminophen (PERCOCET/ROXICET) 5-325 MG per tablet 1 tablet (1 tablet Oral Given 04/11/15 1606)    Evaluation does not show pathology that would require ongoing emergent intervention or inpatient treatment. Pt is hemodynamically stable and mentating appropriately. Discussed findings and plan with patient/guardian, who agrees with care plan. All questions answered. Return precautions discussed and outpatient follow up given.   Discharge Medication List as of 04/11/2015  5:15 PM    START taking these medications   Details   promethazine (PHENERGAN) 25 MG suppository Place 1 suppository (25 mg total) rectally every 6 (six) hours as needed for nausea or vomiting., Starting 04/11/2015, Until Discontinued, [employer], PA-C 04/11/15 1918

## 2015-04-17 ENCOUNTER — Encounter: Payer: Self-pay | Admitting: Podiatry

## 2015-04-17 ENCOUNTER — Ambulatory Visit (INDEPENDENT_AMBULATORY_CARE_PROVIDER_SITE_OTHER): Payer: Commercial Managed Care - HMO | Admitting: Podiatry

## 2015-04-17 VITALS — BP 116/75 | HR 94

## 2015-04-17 DIAGNOSIS — Z9889 Other specified postprocedural states: Secondary | ICD-10-CM

## 2015-04-17 MED ORDER — OXYCODONE-ACETAMINOPHEN 10-325 MG PO TABS: 1.0000 | ORAL_TABLET | ORAL | Status: DC | PRN

## 2015-04-17 NOTE — Patient Instructions (Signed)
Follow up with nail surgery. Healing well. Pain medication re ordered as requested. Continue to soak till tenderness subside. Return as needed.

## 2015-04-17 NOTE — Progress Notes (Signed)
Post op nail wound left great toe.  Clean and dry. Some tenderness. Pain medication reordered as per request.

## 2015-04-23 ENCOUNTER — Other Ambulatory Visit: Payer: Self-pay

## 2015-04-23 NOTE — Patient Outreach (Signed)
[facility]) Care Management  04/23/2015  [name] 1965-11-25 [id number]   No response from patient after 3 phone calls and letter outreach attempt.   PLAN: RNCM will close patient due to inability to establish contact with patient.  RNCM will notifiy patients primary MD of inability to establish contact with patient and request assistance with engaging patient for [facility] care management services.  Voice message left with primary MD, Dr. Raiford Noble office requesting return call.   Quinn Plowman RN,BSN,CCM [facility] [place] Telephonic  [phone]

## 2015-04-27 ENCOUNTER — Encounter: Payer: Self-pay | Admitting: Physician Assistant

## 2015-04-27 ENCOUNTER — Ambulatory Visit (INDEPENDENT_AMBULATORY_CARE_PROVIDER_SITE_OTHER): Payer: Commercial Managed Care - HMO | Admitting: Physician Assistant

## 2015-04-27 VITALS — BP 120/68 | HR 91 | Temp 97.8°F | Ht 66.0 in | Wt 209.8 lb

## 2015-04-27 DIAGNOSIS — L03811 Cellulitis of head [any part, except face]: Secondary | ICD-10-CM | POA: Insufficient documentation

## 2015-04-27 MED ORDER — DOXYCYCLINE HYCLATE 100 MG PO CAPS: 100.0000 mg | ORAL_CAPSULE | Freq: Two times a day (BID) | ORAL | Status: DC

## 2015-04-27 NOTE — Assessment & Plan Note (Signed)
Rx Doxycycline. Supportive measures reviewed. Will follow-up early next week.

## 2015-04-27 NOTE — Progress Notes (Signed)
Patient presents to clinic today c/o 2 weeks of a tender spot at the back of her head that is  Worsening in severity. States the area feels hot. Denies trauma or injury to the area. Denies pruritus. Denies noting rash. Denies pain elsewhere.   Past Medical History  Diagnosis Date  . Dysrhythmia     Left sided heart diease  . Mental disorder   . Bipolar affective disorder, mixed, in partial or remission   . Headache(784.0)   . Anxiety   . Depression   . 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 History of gallstones   . PONV (postoperative nausea and vomiting)   . Renal disorder   . Kidney stones   . Pneumonia     Hx: of as a child  . Migraine   . Sleep apnea     sleep test 2014, did not have osa    Current Outpatient Prescriptions on File Prior to Visit  Medication Sig Dispense Refill  . lithium carbonate 300 MG capsule Take 600 mg by mouth at bedtime.     Marland Kitchen QUEtiapine (SEROQUEL) 400 MG tablet Take 2 tablets (800 mg total) by mouth at bedtime. For mood control 60 tablet 0  . zolpidem (AMBIEN) 10 MG tablet Take 10 mg by mouth at bedtime.     No current facility-administered medications on file prior to visit.    No Known Allergies  Family History  Problem Relation Age of Onset  . Colon cancer Father 56  . Stomach cancer Neg Hx   . Rectal cancer Neg Hx   . Esophageal cancer Neg Hx   . Breast cancer Mother   . Breast cancer Father   . Uterine cancer Mother     Social History   Social History  . Marital Status: Married    Spouse Name: N/A  . Number of Children: 6  . Years of Education: N/A   Occupational History  . nursing student    Social History Main Topics  . Smoking status: Never Smoker   . Smokeless tobacco: Never Used  . Alcohol Use: No  . Drug Use: Yes    Special: Other-see comments, Fentanyl, Hydrocodone     Comment: see triage note  . Sexual Activity: Not Asked   Other Topics Concern  . 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  All other ROS are negative.  BP 120/68 mmHg  Pulse 91  Temp(Src) 97.8 F (36.6 C) (Oral)  Ht 5\' 6"  (1.676 m)  Wt 209 lb 12.8 oz (95.165 kg)  BMI 33.88 kg/m2  SpO2 100%  Physical Exam  Constitutional: She is well-developed, well-nourished, and in no distress.  HENT:  Head: Normocephalic and atraumatic.  Right Ear: External ear normal.  Left Ear: External ear normal.  Nose: Nose normal.  Mouth/Throat: Oropharynx is clear and moist. No oropharyngeal exudate.  Eyes: Conjunctivae are normal.  Cardiovascular: Normal rate, regular rhythm, normal heart sounds and intact distal pulses.   Pulmonary/Chest: Effort normal and breath sounds normal. No respiratory distress. She has no  wheezes. She has no rales. She exhibits no tenderness.  Skin: Skin is warm and dry.     Vitals reviewed.   Assessment/Plan: Cellulitis of scalp Rx Doxycycline. Supportive measures reviewed. Will follow-up early next week.

## 2015-04-27 NOTE — Progress Notes (Signed)
Pre visit review using our clinic review tool, if applicable. No additional management support is needed unless otherwise documented below in the visit note. 

## 2015-04-27 NOTE — Patient Instructions (Signed)
Please take antibiotic as directed. Keep skin clean and dry but avoid touching the area repeatedly. OTC pain medications when needed. Some topical Aspercreme with Lidocaine or a cold compress may also be beneficial.  Follow-up next Tuesday or Wednesday.

## 2015-05-01 ENCOUNTER — Ambulatory Visit: Payer: Commercial Managed Care - HMO | Admitting: Physician Assistant

## 2015-05-01 ENCOUNTER — Telehealth: Payer: Self-pay | Admitting: Physician Assistant

## 2015-05-07 NOTE — Telephone Encounter (Signed)
Pt was no show 05/01/15 3:00pm for 30 min OV, 1st no show, pt has not rescheduled, charge or no charge?

## 2015-05-07 NOTE — Telephone Encounter (Signed)
No charge this time. 

## 2015-06-04 ENCOUNTER — Ambulatory Visit: Payer: Self-pay | Admitting: Physician Assistant

## 2015-06-22 ENCOUNTER — Encounter: Payer: Self-pay | Admitting: Physician Assistant

## 2015-06-22 ENCOUNTER — Ambulatory Visit (INDEPENDENT_AMBULATORY_CARE_PROVIDER_SITE_OTHER): Payer: Commercial Managed Care - HMO | Admitting: Physician Assistant

## 2015-06-22 VITALS — BP 108/78 | HR 96 | Temp 97.6°F | Ht 66.0 in | Wt 207.0 lb

## 2015-06-22 DIAGNOSIS — F418 Other specified anxiety disorders: Secondary | ICD-10-CM | POA: Diagnosis not present

## 2015-06-22 MED ORDER — ALPRAZOLAM 0.25 MG PO TABS: 0.2500 mg | ORAL_TABLET | Freq: Three times a day (TID) | ORAL | Status: DC | PRN

## 2015-06-22 NOTE — Progress Notes (Signed)
Patient presents to clinic today to discuss anxiety medication for an upcoming flight out of the country. Patient and her husband are taking a trip to [place] and will be on 3 flights each way. Patient gets extremely anxious with take offs. Wants to have something to calm her down but still allowing her to function when she needs to get off of the plane.   Past Medical History  Diagnosis Date  . Dysrhythmia     Left sided heart diease  . Mental disorder   . Bipolar affective disorder, mixed, in partial or remission   . Headache(784.0)   . Anxiety   . Depression   . 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 History of gallstones   . PONV (postoperative nausea and vomiting)   . Renal disorder   . Kidney stones   . Pneumonia     Hx: of as a child  . Migraine   . Sleep apnea     sleep test 2014, did not have osa    Current Outpatient Prescriptions on File Prior to Visit  Medication Sig Dispense Refill  . lithium carbonate 300 MG capsule Take 600 mg by mouth at bedtime.     Marland Kitchen QUEtiapine (SEROQUEL) 400 MG tablet Take 2 tablets (800 mg total) by mouth at bedtime. For mood control 60 tablet 0  . traZODone (DESYREL) 50 MG tablet Take 1 tablet by mouth daily.    Marland Kitchen zolpidem (AMBIEN) 10 MG tablet Take 10 mg by mouth at bedtime.     No current facility-administered medications on file prior to visit.    No Known Allergies  Family History  Problem Relation Age of Onset  . Colon cancer Father 76  . Stomach cancer Neg Hx   . Rectal cancer Neg Hx   . Esophageal cancer Neg Hx   . Breast cancer Mother   . Breast cancer Father   . Uterine cancer Mother     Social History   Social History  . Marital Status: Married    Spouse Name: N/A  . Number of Children: 6  . Years of Education: N/A   Occupational History  . nursing student    Social History Main Topics  . Smoking status: Never Smoker   . Smokeless tobacco: Never Used  . Alcohol Use: No  . Drug Use:  Yes    Special: Other-see comments, Fentanyl, Hydrocodone     Comment: see triage note  . Sexual Activity: Not Asked   Other Topics Concern  . 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  All other ROS are negative.  BP 108/78 mmHg  Pulse 96  Temp(Src) 97.6 F (36.4 C) (Oral)  Ht 5\' 6"  (1.676 m)  Wt 207 lb (93.895 kg)  BMI 33.43 kg/m2  SpO2 98%  Physical Exam  Constitutional: She is oriented to person, place, and time and well-developed, well-nourished, and in no distress.  HENT:  Head: Normocephalic and atraumatic.  Eyes: Conjunctivae are normal.  Cardiovascular: Normal rate, regular rhythm, normal heart sounds and intact  distal pulses.   Pulmonary/Chest: Effort normal.  Neurological: She is alert and oriented to person, place, and time.  Skin: Skin is warm and dry. No rash noted.  Psychiatric: Affect normal.  Vitals reviewed.   No results found for this or any previous visit (from the past 2160 hour(s)).  Assessment/Plan: Situational anxiety Patient with history of intentional opioid overdose. As a general rule she is not allowed to be prescribed any chronic controlled medications fromour practice. I do not think it unreasonable to give her a few tablets of a low-dose alprazolam to help calm her on her flights. Will only allow small one-time supply to get her through her flights.

## 2015-06-22 NOTE — Progress Notes (Signed)
Pre visit review using our clinic review tool, if applicable. No additional management support is needed unless otherwise documented below in the visit note. 

## 2015-06-22 NOTE — Patient Instructions (Addendum)
Please take the medication only as directed for plane ride to [place]. Continue chronic medications as directed.  We will not be giving any further controlled medications. Follow-up with Psychiatry as scheduled.

## 2015-06-24 DIAGNOSIS — F418 Other specified anxiety disorders: Secondary | ICD-10-CM | POA: Insufficient documentation

## 2015-06-24 NOTE — Assessment & Plan Note (Addendum)
Patient with history of intentional opioid overdose. As a general rule she is not allowed to be prescribed any chronic controlled medications fromour practice. I do not think it unreasonable to give her a few tablets of a low-dose alprazolam to help calm her on her flights. Will only allow small one-time supply to get her through her flights.

## 2016-01-04 ENCOUNTER — Ambulatory Visit: Payer: Commercial Managed Care - HMO | Admitting: Physician Assistant

## 2016-01-08 ENCOUNTER — Ambulatory Visit: Payer: Commercial Managed Care - HMO | Admitting: Physician Assistant

## 2016-01-09 ENCOUNTER — Encounter: Payer: Self-pay | Admitting: Physician Assistant

## 2016-01-09 ENCOUNTER — Ambulatory Visit (INDEPENDENT_AMBULATORY_CARE_PROVIDER_SITE_OTHER): Payer: Commercial Managed Care - HMO | Admitting: Physician Assistant

## 2016-01-09 VITALS — BP 100/68 | HR 90 | Temp 98.2°F | Resp 16 | Ht 66.0 in | Wt 208.5 lb

## 2016-01-09 DIAGNOSIS — Z79899 Other long term (current) drug therapy: Secondary | ICD-10-CM

## 2016-01-09 DIAGNOSIS — Z136 Encounter for screening for cardiovascular disorders: Secondary | ICD-10-CM

## 2016-01-09 DIAGNOSIS — N819 Female genital prolapse, unspecified: Secondary | ICD-10-CM

## 2016-01-09 DIAGNOSIS — F3181 Bipolar II disorder: Secondary | ICD-10-CM

## 2016-01-09 NOTE — Progress Notes (Signed)
Pre visit review using our clinic review tool, if applicable. No additional management support is needed unless otherwise documented below in the visit note/SLS  

## 2016-01-09 NOTE — Progress Notes (Signed)
Patient presents to clinic today to for EKG and lithium levels requested by Psychiatry. Patient states provider would like baseline EKG due to psychotropic pharmacotherapy. Is currently on regimen of Lithium 600 mg daily, Seroquel 800 mg daily and Trazodone 50 mg daily for Bipolar II disorder.   Patient also with history of pelvic prolapse. Would like referral to new gynecologist for discussion of surgical intervention.  Past Medical History:  Diagnosis Date  . Anxiety   . Bipolar affective disorder, mixed, in partial or remission   . Depression   . Dysrhythmia    Left sided heart diease  . Headache(784.0)   . History of gallstones   . Kidney stones   . Mental disorder   . Migraine   . Pneumonia    Hx: of as a child  . PONV (postoperative nausea and vomiting)   . Renal disorder   . Sleep apnea    sleep test 2014, did not have osa  . Substance abuse    Fentanyl, Percocet. last use 04/2011    Current Outpatient Prescriptions on File Prior to Visit  Medication Sig Dispense Refill  . lithium carbonate 300 MG capsule Take 600 mg by mouth at bedtime.     Marland Kitchen QUEtiapine (SEROQUEL) 400 MG tablet Take 2 tablets (800 mg total) by mouth at bedtime. For mood control 60 tablet 0  . traZODone (DESYREL) 50 MG tablet Take 1 tablet by mouth daily.    Marland Kitchen zolpidem (AMBIEN) 10 MG tablet Take 10 mg by mouth at bedtime.     No current facility-administered medications on file prior to visit.    No Known Allergies  Family History  Problem Relation Age of Onset  . Colon cancer Father 53  . Stomach cancer Neg Hx   . Rectal cancer Neg Hx   . Esophageal cancer Neg Hx   . Breast cancer Mother   . Breast cancer Father   . Uterine cancer Mother     Social History   Social History  . Marital status: Married    Spouse name: N/A  . Number of children: 6  . Years of education: N/A   Occupational History  . nursing student    Social History Main Topics  . Smoking status: Never Smoker  .  Smokeless tobacco: Never Used  . Alcohol use No  . Drug use:     Types: Other-see comments, Fentanyl, Hydrocodone     Comment: see triage note  . Sexual activity: Not Asked   Other Topics Concern  . 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  All other ROS are negative.  BP 100/68 (BP Location: Left Arm, Patient Position: Sitting, Cuff Size: Large)   Pulse 90   Temp 98.2 F (36.8 C) (Oral)   Resp 16   Ht 5\' 6"  (1.676 m)   Wt 208 lb 8 oz (94.6 kg)   SpO2 98%   BMI 33.65 kg/m   Physical Exam  Constitutional: She is oriented to person, place, and time and well-developed, well-nourished, and in no distress.  HENT:  Head: Normocephalic and  atraumatic.  Eyes: Conjunctivae are normal.  Cardiovascular: Normal rate, regular rhythm, normal heart sounds and intact distal pulses.   Pulmonary/Chest: Effort normal and breath sounds normal. No respiratory distress. She has no wheezes. She has no rales. She exhibits no tenderness.  Neurological: She is alert and oriented to person, place, and time.  Skin: Skin is warm and dry. No rash noted.  Psychiatric: Affect normal.  Vitals reviewed.   Assessment/Plan: 1. Screening, ischemic heart disease Baseline EKG obtained to send to specialist. 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 Will forward to specialist.  - EKG 12-Lead  2. Bipolar II disorder ([place]) Doing very well at present. Followed by specialist. Will place standing order for lithium level monthly x 3 months. Will fax results to specialist. - Lithium level; Standing  3. Female genital prolapse, unspecified type Referral to gynecology placed for further  assessment and management. - Ambulatory referral to Gynecology   Leeanne Rio, PA-C

## 2016-01-09 NOTE — Patient Instructions (Signed)
Please go to the lab for blood work. I will call you with your results and we will fax results over to [facility] with psychiatry.   You will be contacted to schedule an appointment with gynecology.  I am glad you are doing well!

## 2016-01-23 ENCOUNTER — Telehealth: Payer: Self-pay | Admitting: Physician Assistant

## 2016-01-23 DIAGNOSIS — Z01419 Encounter for gynecological examination (general) (routine) without abnormal findings: Secondary | ICD-10-CM

## 2016-01-23 NOTE — Telephone Encounter (Signed)
Referral placed per provider VO/SLS 11/08

## 2016-01-23 NOTE — Telephone Encounter (Signed)
Caller name: Rosemarie Ax  Relation to pt: [employer] back number: [phone] Pharmacy:  Reason for call:[facility] is needing a referral for pt to visit there office on February 11, 2016 (pt is seeing Dr Toney Rakes). Rosemarie Ax stated pt has [facility] and before pt's appt they are needing the referral. Please advise.

## 2016-01-28 ENCOUNTER — Telehealth: Payer: Self-pay | Admitting: Physician Assistant

## 2016-01-28 NOTE — Telephone Encounter (Signed)
Please see below.

## 2016-01-28 NOTE — Telephone Encounter (Signed)
Do not see where patient has returned for lithium levels. Please remind her she needs to go to the [facility] office lab for this blood draw so I can forward results to her Psychiatrist.

## 2016-01-28 NOTE — Telephone Encounter (Signed)
Advised patient to return to the [facility] location for lab draw. Offered to schedule appointment for lab but patient wanted to call and schedule herself.

## 2016-02-11 ENCOUNTER — Ambulatory Visit: Payer: Self-pay | Admitting: Gynecology

## 2016-02-14 ENCOUNTER — Telehealth: Payer: Self-pay | Admitting: *Deleted

## 2016-02-14 DIAGNOSIS — G894 Chronic pain syndrome: Secondary | ICD-10-CM

## 2016-02-14 DIAGNOSIS — F1120 Opioid dependence, uncomplicated: Secondary | ICD-10-CM

## 2016-02-14 NOTE — Telephone Encounter (Signed)
Spoke with pt, she has [employer] until January. Can we find out if we just need to recert with [facility] or does she need a new Referral placed?   Pt last seen Pain Management 2 years ago. States she sees them from leg and foot injury she sustained and multiple surgeries.

## 2016-02-14 NOTE — Telephone Encounter (Signed)
Contacted [facility] to inquire if new referral is required since patient has been established there.  Their office closes at 4:30, was unable to speak with anyone.  Will attempt to call office back tomorrow.

## 2016-02-14 NOTE — Telephone Encounter (Signed)
Patient called requesting that Elyn Aquas do a referral to Dr. Nicholaus Bloom at [facility].  She is aware that the message is being sent and that someone will contact her back if needed.

## 2016-02-18 NOTE — Telephone Encounter (Signed)
Fyi, can you please advise on Dx?

## 2016-02-18 NOTE — Addendum Note (Signed)
Addended by: Davis Gourd on: 02/18/2016 09:39 AM   Modules accepted: Orders

## 2016-02-18 NOTE — Telephone Encounter (Signed)
Referral placed today

## 2016-02-18 NOTE — Telephone Encounter (Signed)
Chronic pain syndrome and opiate dependence.

## 2016-02-18 NOTE — Telephone Encounter (Signed)
Per [facility], a new referral is required since patient has not been seen in office in the last couple years.

## 2016-02-28 ENCOUNTER — Encounter: Payer: Self-pay | Admitting: Gynecology

## 2016-02-28 ENCOUNTER — Ambulatory Visit (INDEPENDENT_AMBULATORY_CARE_PROVIDER_SITE_OTHER): Payer: Commercial Managed Care - HMO | Admitting: Gynecology

## 2016-02-28 VITALS — BP 126/84 | Wt 211.0 lb

## 2016-02-28 DIAGNOSIS — N3941 Urge incontinence: Secondary | ICD-10-CM

## 2016-02-28 DIAGNOSIS — N3281 Overactive bladder: Secondary | ICD-10-CM | POA: Diagnosis not present

## 2016-02-28 MED ORDER — TOLTERODINE TARTRATE ER 2 MG PO CP24: 2.0000 mg | ORAL_CAPSULE | Freq: Every day | ORAL | 11 refills | Status: DC

## 2016-02-28 NOTE — Progress Notes (Signed)
   Patient is a 50 year old gravida 6 para 6 (5 vaginal deliveries one cesarean section) with prior vaginal hysterectomy was referred to our practice by her PCP and cousin patient's concern of possible vaginal prolapse. Patient also complains of urinary urgency and nocturia. Patient with history of bipolar disorder currently on Serquel, lithium and trazodone. Patient on no hormone replacement therapy  Physical exam: Well-developed well-nourished female slightly overweight no acute distress patient was examined in the supine and erect position. Pelvic: Bartholin urethra Skene was within normal limits Vagina: No lesions or discharge vaginal cuff intact Bimanual exam: Unremarkable Rectal exam: Not done  Patient had good anal wink-neurologically intact Bivalve speculum exam did not demonstrate cystocele or rectocele Sterile Q-tip impregnated with lidocaine gel placed at the urethral vesical angle had less than 30 change on Valsalva maneuver indicating no evidence of urethral hypermobility. She was examined in the erect position there was no evidence of any vaginal prolapse or enterocele. On Valsalva and coughing she did not leak.  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 I had contacted the pharmacist at the hospital and did not see any contraindication or interaction with her current medication that she is on. The risks benefits and pros and cons were discussed and literature information was provided. If she some response but not complete after 6 week she will contact the office and we will increase it to 4 mg daily. If symptoms then continue will refer to urologist.

## 2016-02-28 NOTE — Patient Instructions (Signed)
Overactive Bladder, Adult Introduction Overactive bladder is a group of urinary symptoms. With overactive bladder, you may suddenly feel the need to pass urine (urinate) right away. After feeling this sudden urge, you might also leak urine if you cannot get to the bathroom fast enough (urinary incontinence). These symptoms might interfere with your daily work or social activities. Overactive bladder symptoms may also wake you up at night. Overactive bladder affects the nerve signals between your bladder and your brain. Your bladder may get the signal to empty before it is full. Very sensitive muscles can also make your bladder squeeze too soon. What are the causes? Many things can cause an overactive bladder. Possible causes include:  Urinary tract infection.  Infection of nearby tissues, such as the prostate.  Prostate enlargement.  Being pregnant with twins or more (multiples).  Surgery on the uterus or urethra.  Bladder stones, inflammation, or tumors.  Drinking too much caffeine or alcohol.  Certain medicines, especially those that you take to help your body get rid of extra fluid (diuretics) by increasing urine production.  Muscle or nerve weakness, especially from:  A spinal cord injury.  Stroke.  Multiple sclerosis.  Parkinson disease.  Diabetes. This can cause a high urine volume that fills the bladder so quickly that the normal urge to urinate is triggered very strongly.  Constipation. A buildup of too much stool can put pressure on your bladder. What increases the risk? You may be at greater risk for overactive bladder if you:  Are an older adult.  Smoke.  Are going through menopause.  Have prostate problems.  Have a neurological disease, such as stroke, dementia, Parkinson disease, or multiple sclerosis (MS).  Eat or drink things that irritate the bladder. These include alcohol, spicy food, and caffeine.  Are overweight or obese. What are the signs or  symptoms? The signs and symptoms of an overactive bladder include:  Sudden, strong urges to urinate.  Leaking urine.  Urinating eight or more times per day.  Waking up to urinate two or more times per night. How is this diagnosed? Your health care provider may suspect overactive bladder based on your symptoms. The health care provider will do a physical exam and take your medical history. Blood or urine tests may also be done. For example, you might need to have a bladder function test to check how well you can hold your urine. You might also need to see a health care provider who specializes in the urinary tract (urologist). How is this treated? Treatment for overactive bladder depends on the cause of your condition and whether it is mild or severe. Certain treatments can be done in your health care provider's office or clinic. You can also make lifestyle changes at home. Options include: Behavioral Treatments  Biofeedback. A specialist uses sensors to help you become aware of your body's signals.  Keeping a daily log of when you need to urinate and what happens after the urge. This may help you manage your condition.  Bladder training. This helps you learn to control the urge to urinate by following a schedule that directs you to urinate at regular intervals (timed voiding). At first, you might have to wait a few minutes after feeling the urge. In time, you should be able to schedule bathroom visits an hour or more apart.  Kegel exercises. These are exercises to strengthen the pelvic floor muscles, which support the bladder. Toning these muscles can help you control urination, even if your bladder muscles   are overactive. A specialist will teach you how to do these exercises correctly. They require daily practice.  Weight loss. If you are obese or overweight, losing weight might relieve your symptoms of overactive bladder. Talk to your health care provider about losing weight and whether  there is a specific program or method that would work best for you.  Diet change. This might help if constipation is making your overactive bladder worse. Your health care provider or a dietitian can explain ways to change what you eat to ease constipation. You might also need to consume less alcohol and caffeine or drink other fluids at different times of the day.  Stopping smoking.  Wearing pads to absorb leakage while you wait for other treatments to take effect. Physical Treatments  Electrical stimulation. Electrodes send gentle pulses of electricity to strengthen the nerves or muscles that help to control the bladder. Sometimes, the electrodes are placed outside of the body. In other cases, they might be placed inside the body (implanted). This treatment can take several months to have an effect.  Supportive devices. Women may need a plastic device that fits into the vagina and supports the bladder (pessary). Medicines  Several medicines can help treat overactive bladder and are usually used along with other treatments. Some are injected into the muscles involved in urination. Others come in pill form. Your health care provider may prescribe:  Antispasmodics. These medicines block the signals that the nerves send to the bladder. This keeps the bladder from releasing urine at the wrong time.  Tricyclic antidepressants. These types of antidepressants also relax bladder muscles. Surgery  You may have a device implanted to help manage the nerve signals that indicate when you need to urinate.  You may have surgery to implant electrodes for electrical stimulation.  Sometimes, very severe cases of overactive bladder require surgery to change the shape of the bladder. Follow these instructions at home:  Take medicines only as directed by your health care provider.  Use any implants or a pessary as directed by your health care provider.  Make any diet or lifestyle changes that are  recommended by your health care provider. These might include:  Drinking less fluid or drinking at different times of the day. If you need to urinate often during the night, you may need to stop drinking fluids early in the evening.  Cutting down on caffeine or alcohol. Both can make an overactive bladder worse. Caffeine is found in coffee, tea, and sodas.  Doing Kegel exercises to strengthen muscles.  Losing weight if you need to.  Eating a healthy and balanced diet to prevent constipation.  Keep a journal or log to track how much and when you drink and also when you feel the need to urinate. This will help your health care provider to monitor your condition. Contact a health care provider if:  Your symptoms do not get better after treatment.  Your pain and discomfort are getting worse.  You have more frequent urges to urinate.  You have a fever. Get help right away if: You are not able to control your bladder at all. This information is not intended to replace advice given to you by your health care provider. Make sure you discuss any questions you have with your health care provider. Document Released: 12/28/2008 Document Revised: 08/09/2015 Document Reviewed: 07/27/2013  2017 Elsevier  

## 2016-03-14 ENCOUNTER — Encounter (HOSPITAL_COMMUNITY): Payer: Self-pay

## 2016-03-14 ENCOUNTER — Telehealth: Payer: Self-pay | Admitting: Physician Assistant

## 2016-03-14 ENCOUNTER — Ambulatory Visit (HOSPITAL_COMMUNITY)
Admission: EM | Admit: 2016-03-14 | Discharge: 2016-03-14 | Disposition: A | Discharge status: Home / Self Care | Payer: Commercial Managed Care - HMO | Attending: Family Medicine | Admitting: Family Medicine

## 2016-03-14 DIAGNOSIS — J069 Acute upper respiratory infection, unspecified: Secondary | ICD-10-CM | POA: Diagnosis not present

## 2016-03-14 DIAGNOSIS — B9789 Other viral agents as the cause of diseases classified elsewhere: Secondary | ICD-10-CM | POA: Diagnosis not present

## 2016-03-14 MED ORDER — BENZONATATE 100 MG PO CAPS: 100.0000 mg | ORAL_CAPSULE | Freq: Three times a day (TID) | ORAL | 0 refills | Status: DC

## 2016-03-14 MED ORDER — ALBUTEROL SULFATE HFA 108 (90 BASE) MCG/ACT IN AERS: 1.0000 | INHALATION_SPRAY | Freq: Four times a day (QID) | RESPIRATORY_TRACT | 0 refills | Status: DC | PRN

## 2016-03-14 NOTE — ED Provider Notes (Signed)
CSN: [id number]     Arrival date & time 03/14/16  1520 History   First MD Initiated Contact with Patient 03/14/16 1544     Chief Complaint  Patient presents with  . Cough   (Consider location/radiation/quality/duration/timing/severity/associated sxs/prior Treatment) 50 year old female presents to clinic with 24 hour history of cough and congestion and shortness of breath. Patient denies fever, myalgias, n/v/d, sinus pain or pressure, or sore throat. Patient is not a smoker, does report history of asthma, does not currently have an inhaler but states she has not had need of one in a while. Cough is non-productive, described as dry and hacking, with clear sputum.    The history is provided by the patient.    Past Medical History:  Diagnosis Date  . Anxiety   . Bipolar affective disorder, mixed, in partial or remission   . Depression   . Dysrhythmia    Left sided heart diease  . Headache(784.0)   . History of gallstones   . Kidney stones   . Mental disorder   . Migraine   . Pneumonia    Hx: of as a child  . PONV (postoperative nausea and vomiting)   . Renal disorder   . Sleep apnea    sleep test 2014, did not have osa  . Substance abuse    Fentanyl, Percocet. last use 04/2011   Past Surgical History:  Procedure Laterality Date  . ABDOMINAL HYSTERECTOMY    . 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 BUNIONECTOMY     11/14/2011  . CHOLECYSTECTOMY    . COLONOSCOPY     2001, 2008.  Father has colon cancer  . FOOT SURGERY     right x 2  . NASAL SEPTOPLASTY W/ TURBINOPLASTY Bilateral 12/04/2014   Procedure: NASAL SEPTOPLASTY WITH BILATERAL TURBINATE REDUCTION;  Surgeon: Izora Gala, MD;  Location: [place];  Service: ENT;  Laterality: Bilateral;  . TOTAL ABDOMINAL HYSTERECTOMY W/ BILATERAL  SALPINGOOPHORECTOMY    . VAGINAL HYSTERECTOMY     Family History  Problem Relation Age of Onset  . Colon cancer Father 57  . Breast cancer Father   . Breast cancer Mother   . Uterine cancer Mother   . Stomach cancer Neg Hx   . Rectal cancer Neg Hx   . Esophageal cancer Neg Hx    Social History  Substance Use Topics  . Smoking status: Never Smoker  . Smokeless tobacco: Never Used  . Alcohol use No   OB History    No data available     Review of Systems  Constitutional: Negative.   HENT: Positive for congestion. Negative for ear pain, facial swelling, rhinorrhea, sinus pain and sinus pressure.   Eyes: Negative.   Respiratory: Positive for cough and shortness of breath. Negative for wheezing.   Cardiovascular: Negative for chest pain.  Gastrointestinal: Negative.   Genitourinary: Negative.   Musculoskeletal: Negative.   Neurological: Negative.     Allergies  Patient has no known allergies.  Home Medications   Prior to Admission medications   Medication Sig Start Date End Date Taking? Authorizing Provider  lithium carbonate 300 MG capsule Take 600 mg by mouth at bedtime.  05/12/14  Yes Historical Provider, MD  QUEtiapine (SEROQUEL) 400 MG tablet Take 2 tablets (800 mg total) by mouth at bedtime. For  mood control 03/15/14  Yes Encarnacion Slates, NP  tolterodine (DETROL LA) 2 MG 24 hr capsule Take 1 capsule (2 mg total) by mouth daily. 02/28/16  Yes Terrance Mass, MD  traZODone (DESYREL) 50 MG tablet Take 1 tablet by mouth daily. 04/26/15  Yes Historical Provider, MD  zolpidem (AMBIEN) 10 MG tablet Take 10 mg by mouth at bedtime.   Yes Historical Provider, MD  albuterol (PROVENTIL HFA;VENTOLIN HFA) 108 (90 Base) MCG/ACT inhaler Inhale 1-2 puffs into the lungs every 6 (six) hours as needed for wheezing or shortness of breath. 03/14/16   Barnet Glasgow, NP  benzonatate (TESSALON) 100 MG capsule Take 1 capsule (100 mg total) by mouth every 8 (eight) hours. 03/14/16   Barnet Glasgow, NP   Meds Ordered and Administered this Visit  Medications - No data to display  BP 126/79 (BP Location: Right Arm)   Pulse 92   Temp 98.3 F (36.8 C) (Oral)   Resp 16   SpO2 97%  No data found.   Physical Exam  Constitutional: She is oriented to person, place, and time. She appears well-developed and well-nourished. No distress.  HENT:  Head: Normocephalic.  Right Ear: External ear normal.  Left Ear: External ear normal.  Mouth/Throat: Uvula is midline, oropharynx is clear and moist and mucous membranes are normal. No oropharyngeal exudate, posterior oropharyngeal edema or posterior oropharyngeal erythema. Tonsils are 1+ on the right. Tonsils are 1+ on the left.  Eyes: Pupils are equal, round, and reactive to light.  Neck: Normal range of motion. Neck supple. No JVD present.  Cardiovascular: Normal rate and regular rhythm.   Pulmonary/Chest: Effort normal and breath sounds normal. No respiratory distress. She has no wheezes.  Abdominal: Soft. Bowel sounds are normal.  Lymphadenopathy:    She has no cervical adenopathy.  Neurological: She is alert and oriented to person, place, and time.  Skin: Skin is warm and dry. Capillary refill takes less than 2 seconds. She is not diaphoretic. No pallor.  Psychiatric: She has a normal mood and affect.    Urgent Care Course   Clinical Course     Procedures (including critical care time)  Labs Review Labs Reviewed - No data to display  Imaging Review No results found.   Visual Acuity Review  Right Eye Distance:   Left Eye Distance:   Bilateral Distance:    Right Eye Near:   Left Eye Near:    Bilateral Near:         MDM   1. Viral URI with cough    Patient given an rx for tessalon and refilled her albuterol inhaler. Patient advised to take tylenol and antihistamines for symptoms. Should symptoms fail to improve or worsen follow up with PCP or return to clinic.     Barnet Glasgow, NP 03/14/16 1630

## 2016-03-14 NOTE — Discharge Instructions (Signed)
Rest, drink plenty of fluids, you may take antihistamines and tylenol for symptoms. You have been prescribed a medicine for cough and a rescue inhaler. Should symptoms fail to improve or worsen follow up with your primary care provider or return to clinic.

## 2016-03-14 NOTE — ED Triage Notes (Signed)
Pt reports a cough for 2 days along with SOB and Wheezing. No fever or congestion. No OTC medication

## 2016-03-14 NOTE — Telephone Encounter (Signed)
After reviewing patient chart, she was treated at Urgent Care for the issues listed below.

## 2016-03-14 NOTE — Telephone Encounter (Signed)
°  Patient Name: [name]  Gender: Female  DOB: 10/15/1965   Age: 50 Y 11 M 28 D  Return Phone Number: [phone] (Primary)  Address:   City/State/Zip: [place]    Client [place]  Client Site [facility]  Contact Type Call  Who Is Calling Patient / Member / Family / Caregiver  Call Type Triage / Clinical  Relationship To Patient Self  Return Phone Number [phone] (Primary)  Chief Complaint Cough  Reason for Call Symptomatic / Request for [facility] [place] states she has a cough and it hurts when she coughs and is having chest pain and a runny nose.   Appointment Disposition EMR Caller Not Reached  Info pasted into Epic Yes  Translation No   Nurse Assessment      Guidelines      Guideline Title Affirmed Question Affirmed Notes Nurse Date/Time (Eastern Time)         Disp. Time Eilene Ghazi Time) Disposition Final User   03/14/2016 3:40:23 PM Attempt made - message left  Julien Girt, RN, [facility] Free          Comments  User: Eloisa Northern, RN Date/Time Eilene Ghazi Time): 03/14/2016 3:48:59 PM  Unable t reach caller. Per EPIC as below: Current Admission Date: 03/14/2016 Unit: [place] Admitting: Billy Fischer, MD

## 2016-03-15 ENCOUNTER — Emergency Department (HOSPITAL_BASED_OUTPATIENT_CLINIC_OR_DEPARTMENT_OTHER): Payer: Commercial Managed Care - HMO

## 2016-03-15 ENCOUNTER — Inpatient Hospital Stay (HOSPITAL_BASED_OUTPATIENT_CLINIC_OR_DEPARTMENT_OTHER)
Admission: EM | Admit: 2016-03-15 | Discharge: 2016-03-18 | DRG: 193 | Disposition: A | Discharge status: Home / Self Care | Payer: Commercial Managed Care - HMO | Attending: Internal Medicine | Admitting: Internal Medicine

## 2016-03-15 ENCOUNTER — Encounter (HOSPITAL_BASED_OUTPATIENT_CLINIC_OR_DEPARTMENT_OTHER): Payer: Self-pay | Admitting: Emergency Medicine

## 2016-03-15 DIAGNOSIS — J1100 Influenza due to unidentified influenza virus with unspecified type of pneumonia: Secondary | ICD-10-CM | POA: Diagnosis present

## 2016-03-15 DIAGNOSIS — D696 Thrombocytopenia, unspecified: Secondary | ICD-10-CM | POA: Diagnosis present

## 2016-03-15 DIAGNOSIS — J9601 Acute respiratory failure with hypoxia: Secondary | ICD-10-CM | POA: Diagnosis present

## 2016-03-15 DIAGNOSIS — Z90710 Acquired absence of both cervix and uterus: Secondary | ICD-10-CM

## 2016-03-15 DIAGNOSIS — J181 Lobar pneumonia, unspecified organism: Secondary | ICD-10-CM | POA: Diagnosis present

## 2016-03-15 DIAGNOSIS — Z800 Family history of malignant neoplasm of digestive organs: Secondary | ICD-10-CM

## 2016-03-15 DIAGNOSIS — J1008 Influenza due to other identified influenza virus with other specified pneumonia: Secondary | ICD-10-CM | POA: Diagnosis not present

## 2016-03-15 DIAGNOSIS — R05 Cough: Secondary | ICD-10-CM

## 2016-03-15 DIAGNOSIS — R109 Unspecified abdominal pain: Secondary | ICD-10-CM | POA: Diagnosis present

## 2016-03-15 DIAGNOSIS — R112 Nausea with vomiting, unspecified: Secondary | ICD-10-CM | POA: Diagnosis present

## 2016-03-15 DIAGNOSIS — N10 Acute tubulo-interstitial nephritis: Secondary | ICD-10-CM | POA: Diagnosis present

## 2016-03-15 DIAGNOSIS — J111 Influenza due to unidentified influenza virus with other respiratory manifestations: Secondary | ICD-10-CM | POA: Diagnosis present

## 2016-03-15 DIAGNOSIS — Z8049 Family history of malignant neoplasm of other genital organs: Secondary | ICD-10-CM

## 2016-03-15 DIAGNOSIS — R059 Cough, unspecified: Secondary | ICD-10-CM

## 2016-03-15 DIAGNOSIS — F419 Anxiety disorder, unspecified: Secondary | ICD-10-CM | POA: Diagnosis present

## 2016-03-15 DIAGNOSIS — J189 Pneumonia, unspecified organism: Secondary | ICD-10-CM

## 2016-03-15 DIAGNOSIS — Z803 Family history of malignant neoplasm of breast: Secondary | ICD-10-CM

## 2016-03-15 DIAGNOSIS — F319 Bipolar disorder, unspecified: Secondary | ICD-10-CM | POA: Diagnosis present

## 2016-03-15 DIAGNOSIS — R509 Fever, unspecified: Secondary | ICD-10-CM

## 2016-03-15 LAB — URINALYSIS, ROUTINE W REFLEX MICROSCOPIC
Bilirubin Urine: NEGATIVE
Glucose, UA: NEGATIVE mg/dL
HGB URINE DIPSTICK: NEGATIVE
Ketones, ur: NEGATIVE mg/dL
Nitrite: NEGATIVE
Protein, ur: NEGATIVE mg/dL
SPECIFIC GRAVITY, URINE: 1.021 (ref 1.005–1.030)
pH: 6.5 (ref 5.0–8.0)

## 2016-03-15 LAB — COMPREHENSIVE METABOLIC PANEL
ALK PHOS: 58 U/L (ref 38–126)
ALT: 24 U/L (ref 14–54)
ANION GAP: 10 (ref 5–15)
AST: 21 U/L (ref 15–41)
Albumin: 4.5 g/dL (ref 3.5–5.0)
BUN: 9 mg/dL (ref 6–20)
CALCIUM: 9.4 mg/dL (ref 8.9–10.3)
CHLORIDE: 103 mmol/L (ref 101–111)
CO2: 25 mmol/L (ref 22–32)
Creatinine, Ser: 0.9 mg/dL (ref 0.44–1.00)
GFR calc non Af Amer: >60 mL/min (ref >60)
Glucose, Bld: 106 mg/dL — ABNORMAL HIGH (ref 65–99)
Potassium: 3.6 mmol/L (ref 3.5–5.1)
SODIUM: 138 mmol/L (ref 135–145)
Total Bilirubin: 0.6 mg/dL (ref 0.3–1.2)
Total Protein: 7.4 g/dL (ref 6.5–8.1)

## 2016-03-15 LAB — CBC
HCT: 37.7 % (ref 36.0–46.0)
HEMOGLOBIN: 12.5 g/dL (ref 12.0–15.0)
MCH: 30.1 pg (ref 26.0–34.0)
MCHC: 33.2 g/dL (ref 30.0–36.0)
MCV: 90.8 fL (ref 78.0–100.0)
Platelets: 123 10*3/uL — ABNORMAL LOW (ref 150–400)
RBC: 4.15 MIL/uL (ref 3.87–5.11)
RDW: 13.3 % (ref 11.5–15.5)
WBC: 5.2 10*3/uL (ref 4.0–10.5)

## 2016-03-15 LAB — URINALYSIS, MICROSCOPIC (REFLEX)

## 2016-03-15 LAB — LIPASE, BLOOD: LIPASE: 26 U/L (ref 11–51)

## 2016-03-15 MED ORDER — SODIUM CHLORIDE 0.9 % IV BOLUS (SEPSIS)
1000.0000 mL | Freq: Once | INTRAVENOUS | Status: AC
  Administered 2016-03-15: 1000 mL via INTRAVENOUS

## 2016-03-15 MED ORDER — FENTANYL CITRATE (PF) 100 MCG/2ML IJ SOLN
50.0000 ug | Freq: Once | INTRAMUSCULAR | Status: AC
  Administered 2016-03-15: 100 ug via INTRAVENOUS
  Filled 2016-03-15: qty 2

## 2016-03-15 MED ORDER — ONDANSETRON HCL 4 MG/2ML IJ SOLN
4.0000 mg | Freq: Once | INTRAMUSCULAR | Status: AC
  Administered 2016-03-15: 4 mg via INTRAVENOUS
  Filled 2016-03-15: qty 2

## 2016-03-15 MED ORDER — MORPHINE SULFATE (PF) 4 MG/ML IV SOLN
4.0000 mg | Freq: Once | INTRAVENOUS | Status: AC
  Administered 2016-03-15: 4 mg via INTRAVENOUS
  Filled 2016-03-15: qty 1

## 2016-03-15 MED ORDER — DEXTROSE 5 % IV SOLN
500.0000 mg | Freq: Once | INTRAVENOUS | Status: AC
  Administered 2016-03-15: 500 mg via INTRAVENOUS

## 2016-03-15 MED ORDER — AZITHROMYCIN 500 MG IV SOLR
INTRAVENOUS | Status: AC
  Filled 2016-03-15: qty 500

## 2016-03-15 MED ORDER — PROMETHAZINE HCL 25 MG/ML IJ SOLN: 12.5000 mg | Freq: Once | INTRAMUSCULAR | Status: DC

## 2016-03-15 MED ORDER — IBUPROFEN 400 MG PO TABS
400.0000 mg | ORAL_TABLET | Freq: Once | ORAL | Status: AC
  Administered 2016-03-15: 400 mg via ORAL
  Filled 2016-03-15: qty 1

## 2016-03-15 MED ORDER — DEXTROSE 5 % IV SOLN
1.0000 g | Freq: Once | INTRAVENOUS | Status: AC
  Administered 2016-03-15: 1 g via INTRAVENOUS
  Filled 2016-03-15: qty 10

## 2016-03-15 MED ORDER — ALBUTEROL SULFATE (2.5 MG/3ML) 0.083% IN NEBU
5.0000 mg | INHALATION_SOLUTION | Freq: Once | RESPIRATORY_TRACT | Status: AC
  Administered 2016-03-15: 5 mg via RESPIRATORY_TRACT
  Filled 2016-03-15: qty 6

## 2016-03-15 MED ORDER — AZITHROMYCIN 500 MG PO TABS
250.0000 mg | ORAL_TABLET | Freq: Every day | ORAL | Status: DC
  Administered 2016-03-17 – 2016-03-18 (×2): 250 mg via ORAL
  Filled 2016-03-15 (×2): qty 1

## 2016-03-15 MED ORDER — KETOROLAC TROMETHAMINE 30 MG/ML IJ SOLN
30.0000 mg | Freq: Once | INTRAMUSCULAR | Status: AC
  Administered 2016-03-15: 30 mg via INTRAVENOUS
  Filled 2016-03-15: qty 1

## 2016-03-15 MED ORDER — AZITHROMYCIN 250 MG PO TABS
500.0000 mg | ORAL_TABLET | Freq: Every day | ORAL | Status: AC
  Administered 2016-03-15: 500 mg via ORAL
  Filled 2016-03-15: qty 2

## 2016-03-15 NOTE — ED Provider Notes (Signed)
[facility] DEPT [facility] Provider Note   CSN: [id number] Arrival date & time: 03/15/16  1522 By signing my name below, I, Dyke Brackett, attest that this documentation has been prepared under the direction and in the presence of non-physician practitioner, Irena Cords, PA-C. Electronically Signed: Dyke Brackett, Scribe. 03/15/2016. 7:53 PM.   History   Chief Complaint Chief Complaint  Patient presents with  . Flank Pain  . Cough    HPI [name] is a 50 y.o. female who presents to the Emergency Department complaining of worsening, intermittent dry cough onset two days ago. Pt was seen at urgent Care yesterday and was dx with a URI. She also complains of 8/10, constant right flank pain onset today. Pt notes associated left abdominal pain, fever and body aches. No alleviating or modifying factors noted.  No treatments noted. She denies vomiting, diarrhea, neck pain, sore throat, nasal congestion, dysuria or difficulty urinating. Abdominal SHx includes hysterectomy.   The history is provided by the patient. No language interpreter was used.    Past Medical History:  Diagnosis Date  . Anxiety   . Bipolar affective disorder, mixed, in partial or remission   . Depression   . Dysrhythmia    Left sided heart diease  . Headache(784.0)   . History of gallstones   . Kidney stones   . Mental disorder   . Migraine   . Pneumonia    Hx: of as a child  . PONV (postoperative nausea and vomiting)   . Renal disorder   . Sleep apnea    sleep test 2014, did not have osa  . Substance abuse    Fentanyl, Percocet. last use 04/2011    Patient Active Problem List   Diagnosis Date Noted  . OAB (overactive bladder) 02/28/2016  . Urgency incontinence 02/28/2016  . Situational anxiety 06/24/2015  . Cellulitis of scalp 04/27/2015  . Status post nail surgery 04/17/2015  . Mass of arm 04/09/2015  . Viral bronchitis 04/09/2015  . Deviated septum 11/17/2014  . Paronychia 09/02/2014  .  Gastroesophageal reflux disease without esophagitis 09/02/2014  . Chronic pain syndrome 05/24/2014  . Bereavement 03/13/2014  . Bipolar affective disorder, depressed, severe ([place]) 03/11/2014  . Opiate dependence ([place]) 03/11/2014  . Intentional opiate overdose ([place]) 03/11/2014  . De Quervain's tenosynovitis, left 12/12/2013  . Right-sided low back pain without sciatica 09/14/2013  . Immunity status testing 08/10/2013  . Nausea with vomiting 07/28/2013  . Thrombocytopenia, unspecified 07/28/2013  . CAP (community acquired pneumonia) 06/30/2013  . Hypoxia 06/30/2013  . External hemorrhoids 02/16/2013  . Cervical lymphadenopathy 10/12/2012  . Tension headache 10/12/2012  . Ingrown left greater toenail 08/25/2012  . Infection of left foot 08/25/2012  . Schizoaffective disorder, bipolar type ([place]) 03/31/2011  . Bipolar 1 disorder [facility])     Past Surgical History:  Procedure Laterality Date  . ABDOMINAL HYSTERECTOMY    . 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 BUNIONECTOMY     11/14/2011  . CHOLECYSTECTOMY    . COLONOSCOPY     2001, 2008.  Father has colon cancer  . FOOT SURGERY     right x 2  . NASAL SEPTOPLASTY W/ TURBINOPLASTY Bilateral 12/04/2014   Procedure: NASAL SEPTOPLASTY WITH BILATERAL TURBINATE REDUCTION;  Surgeon: Izora Gala, MD;  Location: [facility];  Service: ENT;  Laterality: Bilateral;  . TOTAL ABDOMINAL HYSTERECTOMY W/ BILATERAL SALPINGOOPHORECTOMY    . VAGINAL HYSTERECTOMY      OB History    No data available       Home Medications    Prior to Admission medications   Medication Sig Start Date End Date Taking? Authorizing Provider  albuterol (PROVENTIL HFA;VENTOLIN HFA) 108 (90 Base) MCG/ACT inhaler Inhale 1-2 puffs into the lungs every 6 (six) hours as needed for  wheezing or shortness of breath. 03/14/16   Barnet Glasgow, NP  benzonatate (TESSALON) 100 MG capsule Take 1 capsule (100 mg total) by mouth every 8 (eight) hours. 03/14/16   Barnet Glasgow, NP  lithium carbonate 300 MG capsule Take 600 mg by mouth at bedtime.  05/12/14   Historical Provider, MD  QUEtiapine (SEROQUEL) 400 MG tablet Take 2 tablets (800 mg total) by mouth at bedtime. For mood control 03/15/14   Encarnacion Slates, NP  tolterodine (DETROL LA) 2 MG 24 hr capsule Take 1 capsule (2 mg total) by mouth daily. 02/28/16   Terrance Mass, MD  traZODone (DESYREL) 50 MG tablet Take 1 tablet by mouth daily. 04/26/15   Historical Provider, MD  zolpidem (AMBIEN) 10 MG tablet Take 10 mg by mouth at bedtime.    Historical Provider, MD    Family History Family History  Problem Relation Age of Onset  . Colon cancer Father 93  . Breast cancer Father   . Breast cancer Mother   . Uterine cancer Mother   . Stomach cancer Neg Hx   . Rectal cancer Neg Hx   . Esophageal cancer Neg Hx     Social History Social History  Substance Use Topics  . Smoking status: Never Smoker  . Smokeless tobacco: Never Used  . Alcohol use No    Allergies   Patient has no known allergies.  Review of Systems Review of Systems 10 systems reviewed and all are negative for acute change except as noted in the HPI.  Physical Exam Updated Vital Signs BP 119/75 (BP Location: Right Arm)   Pulse 117   Temp 100.1 F (37.8 C) (Oral)   Resp 20   Ht 5\' 6"  (1.676 m)   Wt 200 lb (90.7 kg)   SpO2 95%   BMI 32.28 kg/m   Physical Exam  Constitutional: She is oriented to person, place, and time. She appears well-developed and well-nourished. No distress.  HENT:  Head: Normocephalic and atraumatic.  Nose: Nose normal.  Mouth/Throat: Oropharynx is clear and moist. No oropharyngeal exudate.  Eyes: Pupils are equal, round, and reactive to light.  Neck: Normal range of motion. Neck supple.  Cardiovascular: Normal rate,  regular rhythm and normal heart sounds.  Exam reveals no gallop and no friction rub.   No murmur heard. Pulmonary/Chest: Effort normal and breath sounds normal. No respiratory distress. She has no wheezes. She has no rales.  Abdominal: Soft. Bowel sounds are normal. She exhibits no distension. There is no tenderness.  Lower abdominal tenderness  Musculoskeletal: She exhibits no edema.  Neurological: She is alert and oriented to person, place, and time. She exhibits normal muscle tone. Coordination normal.  Skin: Skin is warm and dry. Capillary refill takes less than 2 seconds. No rash noted. No erythema.  Psychiatric: She has a normal mood and affect. Her behavior is normal.  Nursing note and vitals reviewed.  ED Treatments / Results  DIAGNOSTIC STUDIES:  Oxygen Saturation is 95% on RA, normal by my interpretation.  COORDINATION OF CARE:  7:44 PM Discussed treatment plan with pt at bedside and pt agreed to plan.   Labs (all labs ordered are listed, but only abnormal results are displayed) Labs Reviewed  URINALYSIS, ROUTINE W REFLEX MICROSCOPIC - Abnormal; Notable for the following:       Result Value   APPearance CLOUDY (*)    Leukocytes, UA MODERATE (*)    All other components within normal limits  URINALYSIS, MICROSCOPIC (REFLEX) - Abnormal; Notable for the following:    Bacteria, UA MANY (*)    Squamous Epithelial / LPF 0-5 (*)    All other components within normal limits  LIPASE, BLOOD  COMPREHENSIVE METABOLIC PANEL  CBC    EKG  EKG Interpretation None       Radiology Dg Chest 2 View  Result Date: 03/15/2016 CLINICAL DATA:  Worsening cough and fever x 2 days. SOB. Hx pna. Non smoker. EXAM: CHEST  2 VIEW COMPARISON:  04/01/2015 FINDINGS: The heart size is normal. In there is minimal patchy density in the lingula, associated mild volume loss. No pulmonary edema. Surgical clips are present in the right upper quadrant of the abdomen. IMPRESSION: Lingular atelectasis/  early infiltrate. Followup PA and lateral chest X-ray is recommended in 3-4 weeks following trial of antibiotic therapy to ensure resolution and exclude underlying malignancy. Electronically Signed   By: Nolon Nations M.D.   On: 03/15/2016 18:56    Procedures Procedures (including critical care time)  Medications Ordered in ED Medications - No data to display   Initial Impression / Assessment and Plan / ED Course  I have reviewed the triage vital signs and the nursing notes.  Pertinent labs & imaging results that were available during my care of the patient were reviewed by me and considered in my medical decision making (see chart for details).  Clinical Course     Patient became hypoxic and will need admission for pneumonia with hypoxia and oxygen requirement.  Patient is advised plan and all questions were answered  Final Clinical Impressions(s) / ED Diagnoses   Final diagnoses:  Cough with fever    New Prescriptions New Prescriptions   No medications on file  } I personally performed the services described in this documentation, which was scribed in my presence. The recorded information has been reviewed and is accurate.   Dalia Heading, PA-C 03/16/16 [id number]    Julianne Rice, MD 03/16/16 [phone]

## 2016-03-15 NOTE — ED Triage Notes (Addendum)
Patient reports "i'm getting worse".  Reports nausea, cough, right flank pain.  Denies dysuria.  Reports previously diagnosed with upper respiratory infection.

## 2016-03-15 NOTE — ED Notes (Signed)
Placed on O2 2L  after SPO2 dropped to 83% on RA, pt remains alert, NAD, calm, interactive.

## 2016-03-15 NOTE — Progress Notes (Signed)
This is a no charge note  Transfer from [facility] per PA, Gerald Stabs  50 year old lady with past medical history of bipolar, anxiety, tobacco abuse, drug abuse, who presents with fever, cough, wheezing for several days. Also has flank pain. Found to have UTI and possible lingular infiltration for CAP. CT-renal stone is negative. Started rocephin and Z pak. Pt is accepted to med-surg bed for obs.  Ivor Costa, MD  [facility] Pager [phone]  If 7PM-7AM, please contact night-coverage [email or web address] Password [email or web address] 03/15/2016, 11:07 PM \

## 2016-03-15 NOTE — ED Notes (Signed)
Ambulated at 94% on RA, while resting in stretcher pt remains 88% RA, upon return to stretcher pt vomited, no other changes, pt alert, NAD, calm.

## 2016-03-15 NOTE — ED Notes (Signed)
Pt sleeping/ sedated, sonorouas resps, RR 18, 95% 3L O2 [place],  arousable to voice, IV zithromax infusing d/t vomiting POs, VSS.

## 2016-03-15 NOTE — ED Notes (Signed)
Pt to xray/CT, alert, NAD, calm, interactive, resps e/u, no dyspnea noted.

## 2016-03-16 ENCOUNTER — Encounter (HOSPITAL_COMMUNITY): Payer: Self-pay | Admitting: Family Medicine

## 2016-03-16 DIAGNOSIS — F319 Bipolar disorder, unspecified: Secondary | ICD-10-CM | POA: Diagnosis present

## 2016-03-16 DIAGNOSIS — N10 Acute tubulo-interstitial nephritis: Secondary | ICD-10-CM | POA: Diagnosis present

## 2016-03-16 DIAGNOSIS — Z90710 Acquired absence of both cervix and uterus: Secondary | ICD-10-CM | POA: Diagnosis not present

## 2016-03-16 DIAGNOSIS — J181 Lobar pneumonia, unspecified organism: Secondary | ICD-10-CM | POA: Diagnosis present

## 2016-03-16 DIAGNOSIS — R109 Unspecified abdominal pain: Secondary | ICD-10-CM | POA: Diagnosis present

## 2016-03-16 DIAGNOSIS — J9601 Acute respiratory failure with hypoxia: Secondary | ICD-10-CM | POA: Diagnosis present

## 2016-03-16 DIAGNOSIS — J1008 Influenza due to other identified influenza virus with other specified pneumonia: Secondary | ICD-10-CM | POA: Diagnosis present

## 2016-03-16 DIAGNOSIS — J111 Influenza due to unidentified influenza virus with other respiratory manifestations: Secondary | ICD-10-CM

## 2016-03-16 DIAGNOSIS — F419 Anxiety disorder, unspecified: Secondary | ICD-10-CM | POA: Diagnosis present

## 2016-03-16 DIAGNOSIS — Z800 Family history of malignant neoplasm of digestive organs: Secondary | ICD-10-CM | POA: Diagnosis not present

## 2016-03-16 DIAGNOSIS — Z8049 Family history of malignant neoplasm of other genital organs: Secondary | ICD-10-CM | POA: Diagnosis not present

## 2016-03-16 DIAGNOSIS — R05 Cough: Secondary | ICD-10-CM | POA: Diagnosis present

## 2016-03-16 DIAGNOSIS — R509 Fever, unspecified: Secondary | ICD-10-CM

## 2016-03-16 DIAGNOSIS — R112 Nausea with vomiting, unspecified: Secondary | ICD-10-CM

## 2016-03-16 DIAGNOSIS — J1100 Influenza due to unidentified influenza virus with unspecified type of pneumonia: Secondary | ICD-10-CM | POA: Diagnosis present

## 2016-03-16 DIAGNOSIS — J189 Pneumonia, unspecified organism: Secondary | ICD-10-CM | POA: Diagnosis not present

## 2016-03-16 DIAGNOSIS — D696 Thrombocytopenia, unspecified: Secondary | ICD-10-CM | POA: Diagnosis present

## 2016-03-16 DIAGNOSIS — Z803 Family history of malignant neoplasm of breast: Secondary | ICD-10-CM | POA: Diagnosis not present

## 2016-03-16 LAB — RESPIRATORY PANEL BY PCR
Adenovirus: NOT DETECTED
BORDETELLA PERTUSSIS-RVPCR: NOT DETECTED
CHLAMYDOPHILA PNEUMONIAE-RVPPCR: NOT DETECTED
CORONAVIRUS HKU1-RVPPCR: NOT DETECTED
Coronavirus 229E: NOT DETECTED
Coronavirus NL63: NOT DETECTED
Coronavirus OC43: NOT DETECTED
INFLUENZA A H3-RVPPCR: DETECTED — AB
Influenza B: NOT DETECTED
Metapneumovirus: NOT DETECTED
Mycoplasma pneumoniae: NOT DETECTED
PARAINFLUENZA VIRUS 3-RVPPCR: NOT DETECTED
Parainfluenza Virus 1: NOT DETECTED
Parainfluenza Virus 2: NOT DETECTED
Parainfluenza Virus 4: NOT DETECTED
RHINOVIRUS / ENTEROVIRUS - RVPPCR: NOT DETECTED
Respiratory Syncytial Virus: NOT DETECTED

## 2016-03-16 LAB — BASIC METABOLIC PANEL
Anion gap: 7 (ref 5–15)
BUN: 7 mg/dL (ref 6–20)
CALCIUM: 8.5 mg/dL — AB (ref 8.9–10.3)
CO2: 26 mmol/L (ref 22–32)
Chloride: 105 mmol/L (ref 101–111)
Creatinine, Ser: 0.83 mg/dL (ref 0.44–1.00)
GFR calc Af Amer: >60 mL/min (ref >60)
GLUCOSE: 118 mg/dL — AB (ref 65–99)
POTASSIUM: 3.6 mmol/L (ref 3.5–5.1)
Sodium: 138 mmol/L (ref 135–145)

## 2016-03-16 LAB — INFLUENZA PANEL BY PCR (TYPE A & B)
INFLAPCR: POSITIVE — AB
Influenza B By PCR: NEGATIVE

## 2016-03-16 LAB — LITHIUM LEVEL: LITHIUM LVL: 0.47 mmol/L — AB (ref 0.60–1.20)

## 2016-03-16 LAB — HIV ANTIBODY (ROUTINE TESTING W REFLEX): HIV SCREEN 4TH GENERATION: NONREACTIVE

## 2016-03-16 LAB — STREP PNEUMONIAE URINARY ANTIGEN: STREP PNEUMO URINARY ANTIGEN: NEGATIVE

## 2016-03-16 MED ORDER — KETOROLAC TROMETHAMINE 30 MG/ML IJ SOLN
30.0000 mg | Freq: Four times a day (QID) | INTRAMUSCULAR | Status: DC | PRN
  Administered 2016-03-16 – 2016-03-17 (×3): 30 mg via INTRAVENOUS
  Filled 2016-03-16 (×3): qty 1

## 2016-03-16 MED ORDER — LITHIUM CARBONATE 300 MG PO CAPS
600.0000 mg | ORAL_CAPSULE | Freq: Every day | ORAL | Status: DC
  Administered 2016-03-16 – 2016-03-17 (×3): 600 mg via ORAL
  Filled 2016-03-16 (×3): qty 2

## 2016-03-16 MED ORDER — DEXTROSE 5 % IV SOLN
1.0000 g | INTRAVENOUS | Status: DC
  Administered 2016-03-16 – 2016-03-17 (×2): 1 g via INTRAVENOUS
  Filled 2016-03-16 (×3): qty 10

## 2016-03-16 MED ORDER — TRAZODONE HCL 50 MG PO TABS
50.0000 mg | ORAL_TABLET | Freq: Every day | ORAL | Status: DC
  Administered 2016-03-16 – 2016-03-17 (×3): 50 mg via ORAL
  Filled 2016-03-16 (×3): qty 1

## 2016-03-16 MED ORDER — ACETAMINOPHEN 325 MG PO TABS
650.0000 mg | ORAL_TABLET | Freq: Four times a day (QID) | ORAL | Status: DC | PRN
  Administered 2016-03-17: 650 mg via ORAL
  Filled 2016-03-16: qty 2

## 2016-03-16 MED ORDER — ALBUTEROL SULFATE (2.5 MG/3ML) 0.083% IN NEBU: 2.5000 mg | INHALATION_SOLUTION | RESPIRATORY_TRACT | Status: DC | PRN

## 2016-03-16 MED ORDER — QUETIAPINE FUMARATE 400 MG PO TABS
800.0000 mg | ORAL_TABLET | Freq: Every day | ORAL | Status: DC
  Administered 2016-03-16 – 2016-03-17 (×3): 800 mg via ORAL
  Filled 2016-03-16 (×3): qty 2

## 2016-03-16 MED ORDER — ZOLPIDEM TARTRATE 5 MG PO TABS
5.0000 mg | ORAL_TABLET | Freq: Every day | ORAL | Status: DC
  Administered 2016-03-17: 5 mg via ORAL
  Filled 2016-03-16: qty 1

## 2016-03-16 MED ORDER — FESOTERODINE FUMARATE ER 4 MG PO TB24
4.0000 mg | ORAL_TABLET | Freq: Every day | ORAL | Status: DC
  Administered 2016-03-16 – 2016-03-18 (×3): 4 mg via ORAL
  Filled 2016-03-16 (×3): qty 1

## 2016-03-16 MED ORDER — BENZONATATE 100 MG PO CAPS
100.0000 mg | ORAL_CAPSULE | Freq: Three times a day (TID) | ORAL | Status: DC
  Administered 2016-03-16 – 2016-03-18 (×7): 100 mg via ORAL
  Filled 2016-03-16 (×8): qty 1

## 2016-03-16 MED ORDER — OSELTAMIVIR PHOSPHATE 75 MG PO CAPS
75.0000 mg | ORAL_CAPSULE | Freq: Two times a day (BID) | ORAL | Status: DC
  Administered 2016-03-16 – 2016-03-18 (×5): 75 mg via ORAL
  Filled 2016-03-16 (×5): qty 1

## 2016-03-16 MED ORDER — PROMETHAZINE HCL 25 MG/ML IJ SOLN
12.5000 mg | Freq: Four times a day (QID) | INTRAMUSCULAR | Status: DC | PRN
  Administered 2016-03-16 (×2): 12.5 mg via INTRAVENOUS
  Administered 2016-03-17 – 2016-03-18 (×5): 25 mg via INTRAVENOUS
  Filled 2016-03-16 (×7): qty 1

## 2016-03-16 MED ORDER — SODIUM CHLORIDE 0.9 % IV SOLN
INTRAVENOUS | Status: AC
  Administered 2016-03-16 (×2): via INTRAVENOUS

## 2016-03-16 MED ORDER — ENOXAPARIN SODIUM 40 MG/0.4ML ~~LOC~~ SOLN
40.0000 mg | SUBCUTANEOUS | Status: DC
  Administered 2016-03-16 – 2016-03-18 (×3): 40 mg via SUBCUTANEOUS
  Filled 2016-03-16 (×3): qty 0.4

## 2016-03-16 MED ORDER — ORAL CARE MOUTH RINSE
15.0000 mL | Freq: Two times a day (BID) | OROMUCOSAL | Status: DC
  Administered 2016-03-16 – 2016-03-18 (×4): 15 mL via OROMUCOSAL

## 2016-03-16 NOTE — Progress Notes (Signed)
Pt vomited 1 minute after she took her medications, MD notified.

## 2016-03-16 NOTE — Progress Notes (Signed)
Pt arrived to [facility], Alert and oriented x 4, no signs of acute distress, VS stable. Admission paged upon pt arrival to floor. Pt oriented to room, instructed to call for assistance, call light within reach.

## 2016-03-16 NOTE — Progress Notes (Signed)
PROGRESS NOTE                                                                                                                                                                                                             Patient Demographics:    [name], is a 50 y.o. female, DOB - 1966-02-21, [id number]  Admit date - 03/15/2016   Admitting Physician Vianne Bulls, MD  Outpatient Primary MD for the patient is Leeanne Rio, PA-C  LOS - 0  Outpatient Specialists:None  Chief Complaint  Patient presents with  . Flank Pain  . Cough       Brief Narrative   50 year old female with bipolar disorder presented to [facility] with fever, cough wheezing and right flank pain. Symptoms started 3 days prior to admission with progressive worsening. She was seen at the urgent care on the day prior to admission and diagnosed with acute viral URI, given prescription for albuterol refill, Tessalon and Tylenol. In the ED she was hypoxic to mid 80s on room air, tachycardic to 110s and chest x-ray suggestive of lingular infiltrate. PA was sized to UTI. She was given 1 L normal saline bolus with improvement in tachycardia, placed on supplemental oxygen and started on empiric Rocephin and azithromycin for lobar pneumonia and UTI. Patient observed on hospitalist service.  Flu PCR was found to be positive.    Subjective:    Patient reports generalized weakness and suprapubic pain. Had an episode of vomiting this morning.   Assessment  & Plan :    Principal Problem: Acute respiratory failure with hypoxia (HCC) Secondary to lobar pneumonia and influenza A. Started Tamiflu. . On empiric Rocephin and azithromycin. Supportive care with Tylenol and antitussives. Continue O2 via nasal cannula and nebs. Respiratory viral panel and blood cultures pending.  Active Problems: UTI ([place]) On empiric Rocephin. Follow cultures. CT  abdomen and pelvis negative for renal stone, hydronephrosis or abscess.  Bipolar disorder Denies any symptoms. Continue lithium, Seroquel and trazodone. Lithium level was subtherapeutic. Check QTC while patient is on high dose Seroquel, trazodone, lithium and azithromycin.  Thrombocytopenia Possibly due to acute infection. Monitor.  Code Status : Full code  Family Communication  : None at bedside  Disposition Plan  : Home once improved, possibly in the next 48-72 hours  Barriers For Discharge :  Active symptoms  Consults  :  None  Procedures  : CT renal study  DVT Prophylaxis  :  Lovenox -  Lab Results  Component Value Date   PLT 123 (L) 03/15/2016    Antibiotics  :    Anti-infectives    Start     Dose/Rate Route Frequency Ordered Stop   03/17/16 1000  azithromycin (ZITHROMAX) tablet 250 mg     250 mg Oral Daily 03/15/16 2304 03/21/16 0959   03/16/16 2000  cefTRIAXone (ROCEPHIN) 1 g in dextrose 5 % 50 mL IVPB     1 g 100 mL/hr over 30 Minutes Intravenous Every 24 hours 03/16/16 0133     03/16/16 1145  oseltamivir (TAMIFLU) capsule 75 mg     75 mg Oral 2 times daily 03/16/16 1133 03/21/16 0959   03/16/16 1000  azithromycin (ZITHROMAX) tablet 500 mg     500 mg Oral Daily 03/15/16 2304 03/15/16 2324   03/15/16 2345  azithromycin (ZITHROMAX) 500 mg in dextrose 5 % 250 mL IVPB     500 mg 250 mL/hr over 60 Minutes Intravenous  Once 03/15/16 2333 03/16/16 0039   03/15/16 2334  azithromycin (ZITHROMAX) 500 MG injection    Comments:  Onalee Hua   : cabinet override      03/15/16 2334 03/16/16 0126   03/15/16 2130  cefTRIAXone (ROCEPHIN) 1 g in dextrose 5 % 50 mL IVPB     1 g 100 mL/hr over 30 Minutes Intravenous  Once 03/15/16 2116 03/15/16 2234        Objective:   Vitals:   03/15/16 2330 03/15/16 2345 03/16/16 0044 03/16/16 0602  BP: 119/78 114/71 106/63 102/66  Pulse: 95 92 86 89  Resp:   19 18  Temp:   98.5 F (36.9 C) 97.7 F (36.5 C)  TempSrc:   Oral Oral   SpO2: 94% 94% 98% 99%  Weight:      Height:        Wt Readings from Last 3 Encounters:  03/15/16 90.7 kg (200 lb)  02/28/16 95.7 kg (211 lb)  01/09/16 94.6 kg (208 lb 8 oz)     Intake/Output Summary (Last 24 hours) at 03/16/16 1134 Last data filed at 03/16/16 0300  Gross per 24 hour  Intake              143 ml  Output               50 ml  Net               93 ml     Physical Exam  [id number] fatigued HEENT:  moist mucosa, supple neck Chest: Fine bibasilar crackles CVS: N S1&S2, no murmurs,  GI: soft, nondistended, BS+, suprapubic tenderness Musculoskeletal: warm, no edema CNS: Alert and oriented, nonfocal    Data Review:    CBC  Recent Labs Lab 03/15/16 2055  WBC 5.2  HGB 12.5  HCT 37.7  PLT 123*  MCV 90.8  MCH 30.1  MCHC 33.2  RDW 13.3    Chemistries   Recent Labs Lab 03/15/16 2055 03/16/16 0224  NA 138 138  K 3.6 3.6  CL 103 105  CO2 25 26  GLUCOSE 106* 118*  BUN 9 7  CREATININE 0.90 0.83  CALCIUM 9.4 8.5*  AST 21  --   ALT 24  --   ALKPHOS 58  --   BILITOT 0.6  --    ------------------------------------------------------------------------------------------------------------------ No results for input(s): CHOL, HDL, LDLCALC,  TRIG, CHOLHDL, LDLDIRECT in the last 72 hours.  Lab Results  Component Value Date   HGBA1C 5.1 09/25/2011   ------------------------------------------------------------------------------------------------------------------ No results for input(s): TSH, T4TOTAL, T3FREE, THYROIDAB in the last 72 hours.  Invalid input(s): FREET3 ------------------------------------------------------------------------------------------------------------------ No results for input(s): VITAMINB12, FOLATE, FERRITIN, TIBC, IRON, RETICCTPCT in the last 72 hours.  Coagulation profile No results for input(s): INR, PROTIME in the last 168 hours.  No results for input(s): DDIMER in the last 72 hours.  Cardiac Enzymes No results for  input(s): CKMB, TROPONINI, MYOGLOBIN in the last 168 hours.  Invalid input(s): CK ------------------------------------------------------------------------------------------------------------------ No results found for: BNP  Inpatient Medications  Scheduled Meds: . [START ON 03/17/2016] azithromycin  250 mg Oral Daily  . benzonatate  100 mg Oral Q8H  . cefTRIAXone (ROCEPHIN)  IV  1 g Intravenous Q24H  . enoxaparin (LOVENOX) injection  40 mg Subcutaneous Q24H  . fesoterodine  4 mg Oral Daily  . lithium carbonate  600 mg Oral QHS  . mouth rinse  15 mL Mouth Rinse BID  . oseltamivir  75 mg Oral BID  . QUEtiapine  800 mg Oral QHS  . traZODone  50 mg Oral QHS  . zolpidem  5 mg Oral QHS   Continuous Infusions: PRN Meds:.acetaminophen, albuterol, ketorolac, promethazine  Micro Results No results found for this or any previous visit (from the past 240 hour(s)).  Radiology Reports Dg Chest 2 View  Result Date: 03/15/2016 CLINICAL DATA:  Worsening cough and fever x 2 days. SOB. Hx pna. Non smoker. EXAM: CHEST  2 VIEW COMPARISON:  04/01/2015 FINDINGS: The heart size is normal. In there is minimal patchy density in the lingula, associated mild volume loss. No pulmonary edema. Surgical clips are present in the right upper quadrant of the abdomen. IMPRESSION: Lingular atelectasis/ early infiltrate. Followup PA and lateral chest X-ray is recommended in 3-4 weeks following trial of antibiotic therapy to ensure resolution and exclude underlying malignancy. Electronically Signed   By: Nolon Nations M.D.   On: 03/15/2016 18:56   Ct Renal Stone Study  Result Date: 03/15/2016 CLINICAL DATA:  Right flank pain for 1 week. EXAM: CT ABDOMEN AND PELVIS WITHOUT CONTRAST TECHNIQUE: Multidetector CT imaging of the abdomen and pelvis was performed following the standard protocol without IV contrast. COMPARISON:  CT scan of Jul 28, 2012. FINDINGS: Lower chest: No acute abnormality. Hepatobiliary: No focal liver  abnormality is seen. Status post cholecystectomy. No biliary dilatation. Pancreas: Unremarkable. No pancreatic ductal dilatation or surrounding inflammatory changes. Spleen: Normal in size without focal abnormality. Adrenals/Urinary Tract: Adrenal glands are unremarkable. Kidneys are normal, without renal calculi, focal lesion, or hydronephrosis. Bladder is unremarkable. Stomach/Bowel: There is no evidence of bowel obstruction. Vascular/Lymphatic: No significant vascular findings are present. No enlarged abdominal or pelvic lymph nodes. Reproductive: Status post hysterectomy. No adnexal masses. Other: No abdominal wall hernia or abnormality. No abdominopelvic ascites. Musculoskeletal: No acute or significant osseous findings. IMPRESSION: No hydronephrosis or renal obstruction is noted. No renal or ureteral calculi are noted. No acute abnormality seen in the abdomen or pelvis. Electronically Signed   By: Marijo Conception, M.D.   On: 03/15/2016 20:48    Time Spent in minutes  25   Louellen Molder M.D on 03/16/2016 at 11:34 AM  Between 7am to 7pm - Pager - [phone]  After 7pm go to [email or web address] - password [facility]  [facility] -  Office  [phone]

## 2016-03-16 NOTE — Progress Notes (Signed)
Pharmacy Antibiotic Note  [name] is a 50 y.o. female admitted on 03/15/2016 with UTI and CAP.  Pharmacy has been consulted for Rocephin dosing.  Plan: Rocephin 1g IV Q24H.  Pharmacy will sign off.  Height: 5\' 6"  (167.6 cm) Weight: 200 lb (90.7 kg) IBW/kg (Calculated) : 59.3  Temp (24hrs), Avg:99.6 F (37.6 C), Min:98.5 F (36.9 C), Max:100.1 F (37.8 C)   Recent Labs Lab 03/15/16 2055  WBC 5.2  CREATININE 0.90    Estimated Creatinine Clearance: 85.8 mL/min (by C-G formula based on SCr of 0.9 mg/dL).    No Known Allergies   Thank you for allowing pharmacy to be a part of this patient's care.  Wynona Neat, PharmD, BCPS  03/16/2016 1:33 AM

## 2016-03-16 NOTE — H&P (Signed)
History and Physical    [name] [id number] DOB: 1965-05-27 DOA: 03/15/2016  PCP: Leeanne Rio, PA-C   Patient coming from: Home, by way of [facility] ED   Chief Complaint: Fever, cough, wheezing, flank pain, lower abd pain  HPI: [name] is a 50 y.o. female with medical history significant for bipolar disorder and remote substance abuse who presented to the [place] emergency department this evening for evaluation of fever, cough, wheezing, and right flank pain. Patient reports his symptoms began approximately 3 days prior and had progressively worsened. She was evaluated at an urgent care on 03/14/2016, diagnosed with acute viral URI, and provided prescriptions for albuterol refill, Tessalon, and acetaminophen. Her condition is continued to worsen since that time, marked by continued fevers, persistent nonproductive cough, wheezing, exertional dyspnea, and right flank pain. Pain in the right flank is described as severe, cramping, waxing and waning, nonradiating, and with no alleviating or exacerbating factors identified. There is also associated tenderness in the suprapubic region as well as dysuria. Patient denies any chest pain, palpitations, lower extremity edema, orthopnea, long distance travel, or sick contacts. She has not been hospitalized recently and has not been on antibiotics recently.  [facility] ED Course: Upon arrival to the [facility] ED, patient is found tohave temp of 37.8 C, saturating 84% on room air, tachycardic in the 110s, and with stable blood pressure. Chest x-ray features a lingular infiltrate. Chemistry panel is unremarkable and CBC is notable only for a mild and stable chronic thrombocytopenia with platelets 123,000. Urinalysis is suggestive of infection in the setting of patient's flank pain and suprapubic tenderness. She was given 1 L of normal saline with resolution of the tachycardia. Supplemental oxygen was provided with nasal cannula, resulting  in normalization of her saturation. She was started on empiric Rocephin and azithromycin for the suspected community-acquired pneumonia and UTI. Symptomatic care was provided with fentanyl, Toradol, Advil, morphine, and Zofran. Given the patient's hypoxia, transfer to [facility] for admission was requested.  Patient is interviewed and examined on the inpatient unit at [facility] where she will be observed for ongoing evaluation and management of hypoxia, flank pain, suprapubic tenderness, nausea, and vomiting, suspected secondary to community-acquired pneumonia and UTI.  Review of Systems:  All other systems reviewed and apart from HPI, are negative.  Past Medical History:  Diagnosis Date  . Anxiety   . Bipolar affective disorder, mixed, in partial or remission   . Depression   . Dysrhythmia    Left sided heart diease  . Headache(784.0)   . History of gallstones   . Kidney stones   . Mental disorder   . Migraine   . Pneumonia    Hx: of as a child  . PONV (postoperative nausea and vomiting)   . Renal disorder   . Sleep apnea    sleep test 2014, did not have osa  . Substance abuse    Fentanyl, Percocet. last use 04/2011    Past Surgical History:  Procedure Laterality Date  . ABDOMINAL HYSTERECTOMY    . 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 BUNIONECTOMY     11/14/2011  . CHOLECYSTECTOMY    . COLONOSCOPY     2001, 2008.  Father has colon cancer  . FOOT SURGERY     right  x 2  . NASAL SEPTOPLASTY W/ TURBINOPLASTY Bilateral 12/04/2014   Procedure: NASAL SEPTOPLASTY WITH BILATERAL TURBINATE REDUCTION;  Surgeon: Izora Gala, MD;  Location: [place];  Service: ENT;  Laterality: Bilateral;  . TOTAL ABDOMINAL HYSTERECTOMY W/ BILATERAL SALPINGOOPHORECTOMY    . VAGINAL HYSTERECTOMY        reports that she has never smoked. She has never used smokeless tobacco. She reports that she uses drugs, including Other-see comments, Fentanyl, and Hydrocodone. She reports that she does not drink alcohol.  No Known Allergies  Family History  Problem Relation Age of Onset  . Colon cancer Father 66  . Breast cancer Father   . Breast cancer Mother   . Uterine cancer Mother   . Stomach cancer Neg Hx   . Rectal cancer Neg Hx   . Esophageal cancer Neg Hx      Prior to Admission medications   Medication Sig Start Date End Date Taking? Authorizing Provider  albuterol (PROVENTIL HFA;VENTOLIN HFA) 108 (90 Base) MCG/ACT inhaler Inhale 1-2 puffs into the lungs every 6 (six) hours as needed for wheezing or shortness of breath. 03/14/16   Barnet Glasgow, NP  benzonatate (TESSALON) 100 MG capsule Take 1 capsule (100 mg total) by mouth every 8 (eight) hours. 03/14/16   Barnet Glasgow, NP  lithium carbonate 300 MG capsule Take 600 mg by mouth at bedtime.  05/12/14   Historical Provider, MD  QUEtiapine (SEROQUEL) 400 MG tablet Take 2 tablets (800 mg total) by mouth at bedtime. For mood control 03/15/14   Encarnacion Slates, NP  tolterodine (DETROL LA) 2 MG 24 hr capsule Take 1 capsule (2 mg total) by mouth daily. 02/28/16   Terrance Mass, MD  traZODone (DESYREL) 50 MG tablet Take 1 tablet by mouth daily. 04/26/15   Historical Provider, MD  zolpidem (AMBIEN) 10 MG tablet Take 10 mg by mouth at bedtime.    Historical Provider, MD    Physical Exam: Vitals:   03/15/16 2315 03/15/16 2330 03/15/16 2345 03/16/16 0044  BP: 102/67 119/78 114/71 106/63  Pulse: 85 95 92 86  Resp:    19  Temp:    98.5 F (36.9 C)  TempSrc:    Oral  SpO2: 96% 94% 94% 98%  Weight:      Height:          Constitutional: Uncomfortable with active no-bloody, nonbilious vomiting. No diaphoresis or pallor.  Eyes: PERTLA, lids and conjunctivae normal ENMT: Mucous membranes are moist. Posterior pharynx clear of any  exudate or lesions.   Neck: normal, supple, no masses, no thyromegaly Respiratory: Expiratory wheeze, rhonchi at bilateral mid-lung zones. Increased WOB. No pallor or cyanosis.  Cardiovascular: Rate ~110 and regular. No significant JVD. Abdomen: No distension, suprapubic tenderness without rebound pain or guarding, no masses palpated. Right CVA tenderness. Bowel sounds normal.  Musculoskeletal: no clubbing / cyanosis. No joint deformity upper and lower extremities. Normal muscle tone.  Skin: no significant rashes, lesions, ulcers. Warm, dry, well-perfused. Neurologic: CN 2-12 grossly intact. Sensation intact, DTR normal. Strength 5/5 in all 4 limbs.  Psychiatric: Normal judgment and insight. Alert and oriented x 3. Normal mood and affect.     Labs on Admission: I have personally reviewed following labs and imaging studies  CBC:  Recent Labs Lab 03/15/16 2055  WBC 5.2  HGB 12.5  HCT 37.7  MCV 90.8  PLT [id number]   Basic Metabolic Panel:  Recent Labs Lab 03/15/16 2055  NA 138  K 3.6  CL 103  CO2 25  GLUCOSE 106*  BUN 9  CREATININE 0.90  CALCIUM 9.4   GFR: Estimated Creatinine Clearance: 85.8 mL/min (by C-G formula based on SCr of 0.9 mg/dL). Liver Function Tests:  Recent Labs Lab 03/15/16 2055  AST 21  ALT 24  ALKPHOS 58  BILITOT 0.6  PROT 7.4  ALBUMIN 4.5    Recent Labs Lab 03/15/16 2055  LIPASE 26   No results for input(s): AMMONIA in the last 168 hours. Coagulation Profile: No results for input(s): INR, PROTIME in the last 168 hours. Cardiac Enzymes: No results for input(s): CKTOTAL, CKMB, CKMBINDEX, TROPONINI in the last 168 hours. BNP (last 3 results) No results for input(s): PROBNP in the last 8760 hours. HbA1C: No results for input(s): HGBA1C in the last 72 hours. CBG: No results for input(s): GLUCAP in the last 168 hours. Lipid Profile: No results for input(s): CHOL, HDL, LDLCALC, TRIG, CHOLHDL, LDLDIRECT in the last 72 hours. Thyroid Function  Tests: No results for input(s): TSH, T4TOTAL, FREET4, T3FREE, THYROIDAB in the last 72 hours. Anemia Panel: No results for input(s): VITAMINB12, FOLATE, FERRITIN, TIBC, IRON, RETICCTPCT in the last 72 hours. Urine analysis:    Component Value Date/Time   COLORURINE YELLOW 03/15/2016 1618   APPEARANCEUR CLOUDY (A) 03/15/2016 1618   LABSPEC 1.021 03/15/2016 1618   PHURINE 6.5 03/15/2016 1618   GLUCOSEU NEGATIVE 03/15/2016 1618   HGBUR NEGATIVE 03/15/2016 1618   BILIRUBINUR NEGATIVE 03/15/2016 1618   BILIRUBINUR neg 09/14/2013 1717   KETONESUR NEGATIVE 03/15/2016 1618   PROTEINUR NEGATIVE 03/15/2016 1618   UROBILINOGEN 0.2 09/27/2014 2015   NITRITE NEGATIVE 03/15/2016 1618   LEUKOCYTESUR MODERATE (A) 03/15/2016 1618   Sepsis Labs: @LABRCNTIP (procalcitonin:4,lacticidven:4) )No results found for this or any previous visit (from the past 240 hour(s)).   Radiological Exams on Admission: Dg Chest 2 View  Result Date: 03/15/2016 CLINICAL DATA:  Worsening cough and fever x 2 days. SOB. Hx pna. Non smoker. EXAM: CHEST  2 VIEW COMPARISON:  04/01/2015 FINDINGS: The heart size is normal. In there is minimal patchy density in the lingula, associated mild volume loss. No pulmonary edema. Surgical clips are present in the right upper quadrant of the abdomen. IMPRESSION: Lingular atelectasis/ early infiltrate. Followup PA and lateral chest X-ray is recommended in 3-4 weeks following trial of antibiotic therapy to ensure resolution and exclude underlying malignancy. Electronically Signed   By: Nolon Nations M.D.   On: 03/15/2016 18:56   Ct Renal Stone Study  Result Date: 03/15/2016 CLINICAL DATA:  Right flank pain for 1 week. EXAM: CT ABDOMEN AND PELVIS WITHOUT CONTRAST TECHNIQUE: Multidetector CT imaging of the abdomen and pelvis was performed following the standard protocol without IV contrast. COMPARISON:  CT scan of Jul 28, 2012. FINDINGS: Lower chest: No acute abnormality. Hepatobiliary: No  focal liver abnormality is seen. Status post cholecystectomy. No biliary dilatation. Pancreas: Unremarkable. No pancreatic ductal dilatation or surrounding inflammatory changes. Spleen: Normal in size without focal abnormality. Adrenals/Urinary Tract: Adrenal glands are unremarkable. Kidneys are normal, without renal calculi, focal lesion, or hydronephrosis. Bladder is unremarkable. Stomach/Bowel: There is no evidence of bowel obstruction. Vascular/Lymphatic: No significant vascular findings are present. No enlarged abdominal or pelvic lymph nodes. Reproductive: Status post hysterectomy. No adnexal masses. Other: No abdominal wall hernia or abnormality. No abdominopelvic ascites. Musculoskeletal: No acute or significant osseous findings. IMPRESSION: No hydronephrosis or renal obstruction is noted. No renal or ureteral calculi are noted. No acute abnormality seen in the abdomen or pelvis. Electronically Signed  By: Marijo Conception, M.D.   On: 03/15/2016 20:48    EKG: Not performed, will obtain as appropriate.   Assessment/Plan  1. CAP, acute hypoxic respiratory failure  - Saturating 84% on rm air while at rest at [facility] ED; CXR with lingular infiltrate  - Oxygenation normalized with 3 Lpm; will attempt to wean as her condition improves  - She was treated with empiric Rocephin and azithromycin at the outside hospital; plan to continue this regimen here  - Will request sputum culture and gram stain, respiratory virus panel, and strep pneumo urine antigens - Continue supportive care with antipyretics, bronchodilators, mucolytics, antiemetics  2. UTI   - Pt presents with right flank and suprapubic pain  - UA is suggestive on infection in this setting  - She has hx of nephrolithiasis and CT abd/pelvis was obtained with stone protocol at [facility] and neg for stones or acute intraabdominal abnormality  - Send urine for culture and continue empiric Rocephin for now    3. Nausea, vomiting - Likely secondary to  the PNA and/or UTI; will check lithium level to exclude acute toxicity - CT abd/pelvis without obstruction or acute findings  - Pt has not experienced relief with Zofran, will try Phenergan - Continue IVF hydration and monitor electrolytes with repeat chem panel in am    4. Bipolar disorder  - Pt reports this to be quite stable and denies any SI, HI, or hallucinations at time of admission - Continue lithium, Seroquel, trazodone    DVT prophylaxis: sq Lovenox  Code Status: Full  Family Communication: Discussed with patient Disposition Plan: Observe on med-surg Consults called: None Admission status: Observation    Vianne Bulls, MD [facility] Pager [phone]  If 7PM-7AM, please contact night-coverage [email or web address] Password [email or web address]  03/16/2016, 1:29 AM

## 2016-03-17 DIAGNOSIS — N10 Acute tubulo-interstitial nephritis: Secondary | ICD-10-CM

## 2016-03-17 LAB — URINE CULTURE

## 2016-03-17 MED ORDER — DIPHENHYDRAMINE HCL 50 MG/ML IJ SOLN
25.0000 mg | Freq: Once | INTRAMUSCULAR | Status: AC
  Administered 2016-03-18: 25 mg via INTRAVENOUS
  Filled 2016-03-17: qty 1

## 2016-03-17 MED ORDER — METOCLOPRAMIDE HCL 5 MG/ML IJ SOLN
10.0000 mg | Freq: Once | INTRAMUSCULAR | Status: AC
  Administered 2016-03-18: 10 mg via INTRAVENOUS
  Filled 2016-03-17: qty 2

## 2016-03-17 MED ORDER — POTASSIUM CHLORIDE CRYS ER 20 MEQ PO TBCR
40.0000 meq | EXTENDED_RELEASE_TABLET | Freq: Once | ORAL | Status: AC
  Administered 2016-03-17: 40 meq via ORAL
  Filled 2016-03-17: qty 2

## 2016-03-17 NOTE — Progress Notes (Signed)
PROGRESS NOTE                                                                                                                                                                                                             Patient Demographics:    [name], is a 51 y.o. female, DOB - 12-14-1965, [id number]  Admit date - 03/15/2016   Admitting Physician Vianne Bulls, MD  Outpatient Primary MD for the patient is Leeanne Rio, PA-C  LOS - 1  Outpatient Specialists:None  Chief Complaint  Patient presents with  . Flank Pain  . Cough       Brief Narrative   51 year old female with bipolar disorder presented to [facility] [place] with fever, cough wheezing and right flank pain. Symptoms started 3 days prior to admission with progressive worsening. She was seen at the urgent care on the day prior to admission and diagnosed with acute viral URI, given prescription for albuterol refill, Tessalon and Tylenol. In the ED she was hypoxic to mid 80s on room air, tachycardic to 110s and chest x-ray suggestive of lingular infiltrate. PA was sized to UTI. She was given 1 L normal saline bolus with improvement in tachycardia, placed on supplemental oxygen and started on empiric Rocephin and azithromycin for lobar pneumonia and UTI. Patient observed on hospitalist service.  Flu PCR was found to be positive.    Subjective:   Still feels weak and having a headache.   Assessment  & Plan :    Principal Problem: Acute respiratory failure with hypoxia (HCC) Secondary to lobar pneumonia and influenza A. Started Tamiflu. . On empiric Rocephin and azithromycin. Supportive care with Tylenol and antitussives.Wean oxygen as tolerated. Blood cultures not sent on admission.  Active Problems: UTI ([place])  empiric Rocephin. Been significant growth on culture. CT abdomen and pelvis negative for renal stone, hydronephrosis or  abscess.  Bipolar disorder Denies any symptoms. Continue lithium, Seroquel and trazodone. Lithium level was subtherapeutic. Multiple medications including high dose Seroquel, trazodone, lithium and azithromycin. QTC normal.replensih potassium.  Thrombocytopenia Possibly due to acute infection. Recheck in am.  Code Status : Full code  Family Communication  : None at bedside  Disposition Plan  : Home once improved, possibly in the next 48 hours  Barriers For Discharge : Active symptoms  Consults  :  None  Procedures  : CT renal study  DVT Prophylaxis  :  Lovenox -  Lab Results  Component Value Date   PLT 123 (L) 03/15/2016    Antibiotics  :    Anti-infectives    Start     Dose/Rate Route Frequency Ordered Stop   03/17/16 1000  azithromycin (ZITHROMAX) tablet 250 mg     250 mg Oral Daily 03/15/16 2304 03/21/16 0959   03/16/16 2000  cefTRIAXone (ROCEPHIN) 1 g in dextrose 5 % 50 mL IVPB     1 g 100 mL/hr over 30 Minutes Intravenous Every 24 hours 03/16/16 0133     03/16/16 1145  oseltamivir (TAMIFLU) capsule 75 mg     75 mg Oral 2 times daily 03/16/16 1133 03/21/16 0959   03/16/16 1000  azithromycin (ZITHROMAX) tablet 500 mg     500 mg Oral Daily 03/15/16 2304 03/15/16 2324   03/15/16 2345  azithromycin (ZITHROMAX) 500 mg in dextrose 5 % 250 mL IVPB     500 mg 250 mL/hr over 60 Minutes Intravenous  Once 03/15/16 2333 03/16/16 0039   03/15/16 2334  azithromycin (ZITHROMAX) 500 MG injection    Comments:  Onalee Hua   : cabinet override      03/15/16 2334 03/16/16 0126   03/15/16 2130  cefTRIAXone (ROCEPHIN) 1 g in dextrose 5 % 50 mL IVPB     1 g 100 mL/hr over 30 Minutes Intravenous  Once 03/15/16 2116 03/15/16 2234        Objective:   Vitals:   03/16/16 0602 03/16/16 1521 03/16/16 2151 03/17/16 0613  BP: 102/66 117/78 111/67 117/67  Pulse: 89 91 97 99  Resp: 18 20 18 18   Temp: 97.7 F (36.5 C) 100.3 F (37.9 C) 99.2 F (37.3 C) 98 F (36.7 C)  TempSrc:  Oral Oral Oral Oral  SpO2: 99% 99% 96% 99%  Weight:      Height:        Wt Readings from Last 3 Encounters:  03/15/16 90.7 kg (200 lb)  02/28/16 95.7 kg (211 lb)  01/09/16 94.6 kg (208 lb 8 oz)     Intake/Output Summary (Last 24 hours) at 03/17/16 1102 Last data filed at 03/17/16 0800  Gross per 24 hour  Intake              170 ml  Output              300 ml  Net             -130 ml     Physical Exam  [id number] fatigued HEENT:  moist mucosa, supple neck Chest: Fine bibasilar crackles CVS: N S1&S2, no murmurs,  GI: soft, nondistended, BS+, Suprapubic tenderness resolved. Musculoskeletal: warm, no edema CNS: Alert and oriented, nonfocal    Data Review:    CBC  Recent Labs Lab 03/15/16 2055  WBC 5.2  HGB 12.5  HCT 37.7  PLT 123*  MCV 90.8  MCH 30.1  MCHC 33.2  RDW 13.3    Chemistries   Recent Labs Lab 03/15/16 2055 03/16/16 0224  NA 138 138  K 3.6 3.6  CL 103 105  CO2 25 26  GLUCOSE 106* 118*  BUN 9 7  CREATININE 0.90 0.83  CALCIUM 9.4 8.5*  AST 21  --   ALT 24  --   ALKPHOS 58  --   BILITOT 0.6  --    ------------------------------------------------------------------------------------------------------------------ No results for input(s): CHOL, HDL, LDLCALC, TRIG, CHOLHDL, LDLDIRECT  in the last 72 hours.  Lab Results  Component Value Date   HGBA1C 5.1 09/25/2011   ------------------------------------------------------------------------------------------------------------------ No results for input(s): TSH, T4TOTAL, T3FREE, THYROIDAB in the last 72 hours.  Invalid input(s): FREET3 ------------------------------------------------------------------------------------------------------------------ No results for input(s): VITAMINB12, FOLATE, FERRITIN, TIBC, IRON, RETICCTPCT in the last 72 hours.  Coagulation profile No results for input(s): INR, PROTIME in the last 168 hours.  No results for input(s): DDIMER in the last 72  hours.  Cardiac Enzymes No results for input(s): CKMB, TROPONINI, MYOGLOBIN in the last 168 hours.  Invalid input(s): CK ------------------------------------------------------------------------------------------------------------------ No results found for: BNP  Inpatient Medications  Scheduled Meds: . azithromycin  250 mg Oral Daily  . benzonatate  100 mg Oral Q8H  . cefTRIAXone (ROCEPHIN)  IV  1 g Intravenous Q24H  . enoxaparin (LOVENOX) injection  40 mg Subcutaneous Q24H  . fesoterodine  4 mg Oral Daily  . lithium carbonate  600 mg Oral QHS  . mouth rinse  15 mL Mouth Rinse BID  . oseltamivir  75 mg Oral BID  . QUEtiapine  800 mg Oral QHS  . traZODone  50 mg Oral QHS  . zolpidem  5 mg Oral QHS   Continuous Infusions: PRN Meds:.acetaminophen, albuterol, ketorolac, promethazine  Micro Results Recent Results (from the past 240 hour(s))  Respiratory Panel by PCR     Status: Abnormal   Collection Time: 03/16/16  7:53 AM  Result Value Ref Range Status   Adenovirus NOT DETECTED NOT DETECTED Final   Coronavirus 229E NOT DETECTED NOT DETECTED Final   Coronavirus HKU1 NOT DETECTED NOT DETECTED Final   Coronavirus NL63 NOT DETECTED NOT DETECTED Final   Coronavirus OC43 NOT DETECTED NOT DETECTED Final   Metapneumovirus NOT DETECTED NOT DETECTED Final   Rhinovirus / Enterovirus NOT DETECTED NOT DETECTED Final   Influenza A H3 DETECTED (A) NOT DETECTED Final   Influenza B NOT DETECTED NOT DETECTED Final   Parainfluenza Virus 1 NOT DETECTED NOT DETECTED Final   Parainfluenza Virus 2 NOT DETECTED NOT DETECTED Final   Parainfluenza Virus 3 NOT DETECTED NOT DETECTED Final   Parainfluenza Virus 4 NOT DETECTED NOT DETECTED Final   Respiratory Syncytial Virus NOT DETECTED NOT DETECTED Final   Bordetella pertussis NOT DETECTED NOT DETECTED Final   Chlamydophila pneumoniae NOT DETECTED NOT DETECTED Final   Mycoplasma pneumoniae NOT DETECTED NOT DETECTED Final  Urine culture     Status:  Abnormal   Collection Time: 03/16/16  1:18 PM  Result Value Ref Range Status   Specimen Description URINE, CLEAN CATCH  Final   Special Requests NONE  Final   Culture <10,000 COLONIES/mL INSIGNIFICANT GROWTH (A)  Final   Report Status 03/17/2016 FINAL  Final    Radiology Reports Dg Chest 2 View  Result Date: 03/15/2016 CLINICAL DATA:  Worsening cough and fever x 2 days. SOB. Hx pna. Non smoker. EXAM: CHEST  2 VIEW COMPARISON:  04/01/2015 FINDINGS: The heart size is normal. In there is minimal patchy density in the lingula, associated mild volume loss. No pulmonary edema. Surgical clips are present in the right upper quadrant of the abdomen. IMPRESSION: Lingular atelectasis/ early infiltrate. Followup PA and lateral chest X-ray is recommended in 3-4 weeks following trial of antibiotic therapy to ensure resolution and exclude underlying malignancy. Electronically Signed   By: Nolon Nations M.D.   On: 03/15/2016 18:56   Ct Renal Stone Study  Result Date: 03/15/2016 CLINICAL DATA:  Right flank pain for 1 week. EXAM: CT ABDOMEN AND  PELVIS WITHOUT CONTRAST TECHNIQUE: Multidetector CT imaging of the abdomen and pelvis was performed following the standard protocol without IV contrast. COMPARISON:  CT scan of Jul 28, 2012. FINDINGS: Lower chest: No acute abnormality. Hepatobiliary: No focal liver abnormality is seen. Status post cholecystectomy. No biliary dilatation. Pancreas: Unremarkable. No pancreatic ductal dilatation or surrounding inflammatory changes. Spleen: Normal in size without focal abnormality. Adrenals/Urinary Tract: Adrenal glands are unremarkable. Kidneys are normal, without renal calculi, focal lesion, or hydronephrosis. Bladder is unremarkable. Stomach/Bowel: There is no evidence of bowel obstruction. Vascular/Lymphatic: No significant vascular findings are present. No enlarged abdominal or pelvic lymph nodes. Reproductive: Status post hysterectomy. No adnexal masses. Other: No  abdominal wall hernia or abnormality. No abdominopelvic ascites. Musculoskeletal: No acute or significant osseous findings. IMPRESSION: No hydronephrosis or renal obstruction is noted. No renal or ureteral calculi are noted. No acute abnormality seen in the abdomen or pelvis. Electronically Signed   By: Marijo Conception, M.D.   On: 03/15/2016 20:48    Time Spent in minutes  25   Louellen Molder M.D on 03/17/2016 at 11:02 AM  Between 7am to 7pm - Pager - [phone]  After 7pm go to [email or web address] - password [facility]  [facility] -  Office  [phone]

## 2016-03-17 NOTE — Progress Notes (Signed)
Dr. Hilbert Bible notified patient requesting stronger pain medication. Awaiting response.

## 2016-03-18 DIAGNOSIS — F319 Bipolar disorder, unspecified: Secondary | ICD-10-CM

## 2016-03-18 DIAGNOSIS — J189 Pneumonia, unspecified organism: Secondary | ICD-10-CM

## 2016-03-18 DIAGNOSIS — J1100 Influenza due to unidentified influenza virus with unspecified type of pneumonia: Secondary | ICD-10-CM

## 2016-03-18 DIAGNOSIS — R109 Unspecified abdominal pain: Secondary | ICD-10-CM

## 2016-03-18 LAB — CBC
HEMATOCRIT: 38.8 % (ref 36.0–46.0)
Hemoglobin: 12.7 g/dL (ref 12.0–15.0)
MCH: 29.7 pg (ref 26.0–34.0)
MCHC: 32.7 g/dL (ref 30.0–36.0)
MCV: 90.7 fL (ref 78.0–100.0)
Platelets: 132 10*3/uL — ABNORMAL LOW (ref 150–400)
RBC: 4.28 MIL/uL (ref 3.87–5.11)
RDW: 13 % (ref 11.5–15.5)
WBC: 2.9 10*3/uL — AB (ref 4.0–10.5)

## 2016-03-18 MED ORDER — OSELTAMIVIR PHOSPHATE 75 MG PO CAPS: 75.0000 mg | ORAL_CAPSULE | Freq: Two times a day (BID) | ORAL | 0 refills | Status: AC

## 2016-03-18 MED ORDER — LEVOFLOXACIN 750 MG PO TABS: 750.0000 mg | ORAL_TABLET | Freq: Every day | ORAL | 0 refills | Status: AC

## 2016-03-18 MED ORDER — LEVOFLOXACIN 750 MG PO TABS: 750.0000 mg | ORAL_TABLET | Freq: Every day | ORAL | 0 refills | Status: DC

## 2016-03-18 NOTE — Discharge Summary (Addendum)
Physician Discharge Summary  [name] [id number] DOB: 05-11-1965 DOA: 03/15/2016  PCP: Leeanne Rio, PA-C  Admit date: 03/15/2016 Discharge date: 03/18/2016  Admitted From: home Disposition:  home  Recommendations for Outpatient Follow-up:  1. Follow up with PCP in 1-2 weeks 2. Pt will completes 5 day course of tamiflu on 1/5 and 7 day course of abx on 1/7  Home Health:none Equipment/Devices:none  Discharge Condition:stable CODE STATUS: full code Diet recommendation: regular    Discharge Diagnoses:  Principal Problem:   Acute respiratory failure with hypoxia ([place])   Active Problems:  lobar pneumonia ( [place])   Bipolar 1 disorder ([facility])   Nausea with vomiting   Right flank pain   Acute pyelonephritis   Influenza with respiratory manifestation   Influenza with pneumonia   Brief narrative 51 year old female with bipolar disorder presented to [facility] with fever, cough wheezing and right flank pain. Symptoms started 3 days prior to admission with progressive worsening. She was seen at the urgent care on the day prior to admission and diagnosed with acute viral URI, given prescription for albuterol refill, Tessalon and Tylenol. In the ED she was hypoxic to mid 80s on room air, tachycardic to 110s and chest x-ray suggestive of lingular infiltrate. PA was sized to UTI. She was given 1 L normal saline bolus with improvement in tachycardia, placed on supplemental oxygen and started on empiric Rocephin and azithromycin for lobar pneumonia and UTI. Patient observed on hospitalist service.  Flu PCR was found to be positive.  Hospital course  Principal Problem: Acute respiratory failure with hypoxia ([place]) Secondary to lobar pneumonia and influenza A. Started Tamiflu.  Placed on  empiric Rocephin and azithromycin. Improved, weaned off o2 and stable on RA. Blood cultures not sent on admission. Pt stable this am. Will d/c to complete 5 day course of tamiflu  and 7 day course of levofloxacin (will cover both PNA and UTI)  Active Problems: UTI (H[facility])  empiric Rocephin. Insignificant growth on culture. CT abdomen and pelvis negative for renal stone, hydronephrosis or abscess. -d/c on levaquin to complete 7 day course.  Bipolar disorder Denies any symptoms. Continue lithium, Seroquel and trazodone. Lithium level was subtherapeutic. Multiple medications including high dose Seroquel, trazodone, lithium and azithromycin. QTC normal. Replenished low k.  Thrombocytopenia  due to acute infection. improved    Family Communication  : None at bedside  Disposition Plan  : Home   Consults  :  None  Procedures  : CT renal study  Discharge Instructions   Allergies as of 03/18/2016   No Known Allergies     Medication List    TAKE these medications   albuterol 108 (90 Base) MCG/ACT inhaler Commonly known as:  PROVENTIL HFA;VENTOLIN HFA Inhale 1-2 puffs into the lungs every 6 (six) hours as needed for wheezing or shortness of breath.   benzonatate 100 MG capsule Commonly known as:  TESSALON Take 1 capsule (100 mg total) by mouth every 8 (eight) hours.   levofloxacin 750 MG tablet Commonly known as:  LEVAQUIN Take 1 tablet (750 mg total) by mouth daily.   lithium carbonate 300 MG capsule Take 600 mg by mouth at bedtime.   oseltamivir 75 MG capsule Commonly known as:  TAMIFLU Take 1 capsule (75 mg total) by mouth 2 (two) times daily.   QUEtiapine 400 MG tablet Commonly known as:  SEROQUEL Take 2 tablets (800 mg total) by mouth at bedtime. For mood control   tolterodine 2 MG 24  hr capsule Commonly known as:  DETROL LA Take 1 capsule (2 mg total) by mouth daily.   traZODone 50 MG tablet Commonly known as:  DESYREL Take 100 mg by mouth daily.   zolpidem 10 MG tablet Commonly known as:  AMBIEN Take 10 mg by mouth at bedtime.      Follow-up Information    Leeanne Rio, PA-C. Schedule an appointment as soon as  possible for a visit in 1 week(s).   Specialty:  Family Medicine Contact information: [address] [place] [address] [phone]          No Known Allergies    Procedures/Studies: Dg Chest 2 View  Result Date: 03/15/2016 CLINICAL DATA:  Worsening cough and fever x 2 days. SOB. Hx pna. Non smoker. EXAM: CHEST  2 VIEW COMPARISON:  04/01/2015 FINDINGS: The heart size is normal. In there is minimal patchy density in the lingula, associated mild volume loss. No pulmonary edema. Surgical clips are present in the right upper quadrant of the abdomen. IMPRESSION: Lingular atelectasis/ early infiltrate. Followup PA and lateral chest X-ray is recommended in 3-4 weeks following trial of antibiotic therapy to ensure resolution and exclude underlying malignancy. Electronically Signed   By: Nolon Nations M.D.   On: 03/15/2016 18:56   Ct Renal Stone Study  Result Date: 03/15/2016 CLINICAL DATA:  Right flank pain for 1 week. EXAM: CT ABDOMEN AND PELVIS WITHOUT CONTRAST TECHNIQUE: Multidetector CT imaging of the abdomen and pelvis was performed following the standard protocol without IV contrast. COMPARISON:  CT scan of Jul 28, 2012. FINDINGS: Lower chest: No acute abnormality. Hepatobiliary: No focal liver abnormality is seen. Status post cholecystectomy. No biliary dilatation. Pancreas: Unremarkable. No pancreatic ductal dilatation or surrounding inflammatory changes. Spleen: Normal in size without focal abnormality. Adrenals/Urinary Tract: Adrenal glands are unremarkable. Kidneys are normal, without renal calculi, focal lesion, or hydronephrosis. Bladder is unremarkable. Stomach/Bowel: There is no evidence of bowel obstruction. Vascular/Lymphatic: No significant vascular findings are present. No enlarged abdominal or pelvic lymph nodes. Reproductive: Status post hysterectomy. No adnexal masses. Other: No abdominal wall hernia or abnormality. No abdominopelvic ascites. Musculoskeletal: No  acute or significant osseous findings. IMPRESSION: No hydronephrosis or renal obstruction is noted. No renal or ureteral calculi are noted. No acute abnormality seen in the abdomen or pelvis. Electronically Signed   By: Marijo Conception, M.D.   On: 03/15/2016 20:48       Subjective: Feels better,afebrile  Discharge Exam: Vitals:   03/17/16 2135 03/18/16 0519  BP: 128/82 104/69  Pulse: 81 89  Resp: 18 18  Temp: 98.3 F (36.8 C) 97.5 F (36.4 C)   Vitals:   03/17/16 2135 03/18/16 0459 03/18/16 0509 03/18/16 0519  BP: 128/82   104/69  Pulse: 81   89  Resp: 18   18  Temp: 98.3 F (36.8 C)   97.5 F (36.4 C)  TempSrc: Oral   Oral  SpO2: 99% 100% 95% 100%  Weight:      Height:        Gen:not in distress HEENT:  moist mucosa, supple neck Chest: clear b/l CVS: N S1&S2, no murmurs,  GI: soft, nondistended, BS+, non tender. Musculoskeletal: warm, no edema      The results of significant diagnostics from this hospitalization (including imaging, microbiology, ancillary and laboratory) are listed below for reference.     Microbiology: Recent Results (from the past 240 hour(s))  Respiratory Panel by PCR     Status: Abnormal  Collection Time: 03/16/16  7:53 AM  Result Value Ref Range Status   Adenovirus NOT DETECTED NOT DETECTED Final   Coronavirus 229E NOT DETECTED NOT DETECTED Final   Coronavirus HKU1 NOT DETECTED NOT DETECTED Final   Coronavirus NL63 NOT DETECTED NOT DETECTED Final   Coronavirus OC43 NOT DETECTED NOT DETECTED Final   Metapneumovirus NOT DETECTED NOT DETECTED Final   Rhinovirus / Enterovirus NOT DETECTED NOT DETECTED Final   Influenza A H3 DETECTED (A) NOT DETECTED Final   Influenza B NOT DETECTED NOT DETECTED Final   Parainfluenza Virus 1 NOT DETECTED NOT DETECTED Final   Parainfluenza Virus 2 NOT DETECTED NOT DETECTED Final   Parainfluenza Virus 3 NOT DETECTED NOT DETECTED Final   Parainfluenza Virus 4 NOT DETECTED NOT DETECTED Final    Respiratory Syncytial Virus NOT DETECTED NOT DETECTED Final   Bordetella pertussis NOT DETECTED NOT DETECTED Final   Chlamydophila pneumoniae NOT DETECTED NOT DETECTED Final   Mycoplasma pneumoniae NOT DETECTED NOT DETECTED Final  Urine culture     Status: Abnormal   Collection Time: 03/16/16  1:18 PM  Result Value Ref Range Status   Specimen Description URINE, CLEAN CATCH  Final   Special Requests NONE  Final   Culture <10,000 COLONIES/mL INSIGNIFICANT GROWTH (A)  Final   Report Status 03/17/2016 FINAL  Final     Labs: BNP (last 3 results) No results for input(s): BNP in the last 8760 hours. Basic Metabolic Panel:  Recent Labs Lab 03/15/16 2055 03/16/16 0224  NA 138 138  K 3.6 3.6  CL 103 105  CO2 25 26  GLUCOSE 106* 118*  BUN 9 7  CREATININE 0.90 0.83  CALCIUM 9.4 8.5*   Liver Function Tests:  Recent Labs Lab 03/15/16 2055  AST 21  ALT 24  ALKPHOS 58  BILITOT 0.6  PROT 7.4  ALBUMIN 4.5    Recent Labs Lab 03/15/16 2055  LIPASE 26   No results for input(s): AMMONIA in the last 168 hours. CBC:  Recent Labs Lab 03/15/16 2055  WBC 5.2  HGB 12.5  HCT 37.7  MCV 90.8  PLT 123*   Cardiac Enzymes: No results for input(s): CKTOTAL, CKMB, CKMBINDEX, TROPONINI in the last 168 hours. BNP: Invalid input(s): POCBNP CBG: No results for input(s): GLUCAP in the last 168 hours. D-Dimer No results for input(s): DDIMER in the last 72 hours. Hgb A1c No results for input(s): HGBA1C in the last 72 hours. Lipid Profile No results for input(s): CHOL, HDL, LDLCALC, TRIG, CHOLHDL, LDLDIRECT in the last 72 hours. Thyroid function studies No results for input(s): TSH, T4TOTAL, T3FREE, THYROIDAB in the last 72 hours.  Invalid input(s): FREET3 Anemia work up No results for input(s): VITAMINB12, FOLATE, FERRITIN, TIBC, IRON, RETICCTPCT in the last 72 hours. Urinalysis    Component Value Date/Time   COLORURINE YELLOW 03/15/2016 1618   APPEARANCEUR CLOUDY (A)  03/15/2016 1618   LABSPEC 1.021 03/15/2016 1618   PHURINE 6.5 03/15/2016 1618   GLUCOSEU NEGATIVE 03/15/2016 1618   HGBUR NEGATIVE 03/15/2016 1618   BILIRUBINUR NEGATIVE 03/15/2016 1618   BILIRUBINUR neg 09/14/2013 1717   KETONESUR NEGATIVE 03/15/2016 1618   PROTEINUR NEGATIVE 03/15/2016 1618   UROBILINOGEN 0.2 09/27/2014 2015   NITRITE NEGATIVE 03/15/2016 1618   LEUKOCYTESUR MODERATE (A) 03/15/2016 1618   Sepsis Labs Invalid input(s): PROCALCITONIN,  WBC,  LACTICIDVEN Microbiology Recent Results (from the past 240 hour(s))  Respiratory Panel by PCR     Status: Abnormal   Collection Time: 03/16/16  7:53 AM  Result Value Ref Range Status   Adenovirus NOT DETECTED NOT DETECTED Final   Coronavirus 229E NOT DETECTED NOT DETECTED Final   Coronavirus HKU1 NOT DETECTED NOT DETECTED Final   Coronavirus NL63 NOT DETECTED NOT DETECTED Final   Coronavirus OC43 NOT DETECTED NOT DETECTED Final   Metapneumovirus NOT DETECTED NOT DETECTED Final   Rhinovirus / Enterovirus NOT DETECTED NOT DETECTED Final   Influenza A H3 DETECTED (A) NOT DETECTED Final   Influenza B NOT DETECTED NOT DETECTED Final   Parainfluenza Virus 1 NOT DETECTED NOT DETECTED Final   Parainfluenza Virus 2 NOT DETECTED NOT DETECTED Final   Parainfluenza Virus 3 NOT DETECTED NOT DETECTED Final   Parainfluenza Virus 4 NOT DETECTED NOT DETECTED Final   Respiratory Syncytial Virus NOT DETECTED NOT DETECTED Final   Bordetella pertussis NOT DETECTED NOT DETECTED Final   Chlamydophila pneumoniae NOT DETECTED NOT DETECTED Final   Mycoplasma pneumoniae NOT DETECTED NOT DETECTED Final  Urine culture     Status: Abnormal   Collection Time: 03/16/16  1:18 PM  Result Value Ref Range Status   Specimen Description URINE, CLEAN CATCH  Final   Special Requests NONE  Final   Culture <10,000 COLONIES/mL INSIGNIFICANT GROWTH (A)  Final   Report Status 03/17/2016 FINAL  Final     Time coordinating discharge: Over 30  minutes  SIGNED:   Louellen Molder, MD  [facility] 03/18/2016, 10:35 AM Pager   If 7PM-7AM, please contact night-coverage [email or web address] Password [email or web address]

## 2016-03-18 NOTE — Progress Notes (Signed)
Patient was discharged home by MD order; discharged instructions review and give to patient with care notes and prescriptions; IV DIC; skin intact; patient will be escorted to the car by nurse tech via wheelchair.  

## 2016-03-18 NOTE — Care Management Note (Signed)
Case Management Note  Patient Details  Name: [name] MRN: [id number] Date of Birth: 1965-08-13  Subjective/Objective:                 Patient admitted form home with PNA. No CM needs identified. Will DC to home self care today.   Action/Plan:   Expected Discharge Date:                  Expected Discharge Plan:  Home/Self Care  In-House Referral:     Discharge planning Services  CM Consult  Post Acute Care Choice:    Choice offered to:     DME Arranged:    DME Agency:     HH Arranged:    HH Agency:     Status of Service:  Completed, signed off  If discussed at [employer] of Stay Meetings, dates discussed:    Additional Comments:  Carles Collet, RN 03/18/2016, 11:29 AM

## 2016-03-19 ENCOUNTER — Telehealth: Payer: Self-pay

## 2016-03-19 NOTE — Telephone Encounter (Signed)
LM requesting call back to complete TCM and confirm hosp f/u appt.  

## 2016-03-19 NOTE — Telephone Encounter (Signed)
Patient returning call to Maudie Mercury at [facility] office.  I informed patient Maudie Mercury is currently out of the office and I will send her a message to return call.

## 2016-03-20 NOTE — Telephone Encounter (Signed)
LM requesting return call to complete TCM and confirm hosp f/u appt. 

## 2016-03-21 ENCOUNTER — Encounter: Payer: Self-pay | Admitting: Physician Assistant

## 2016-03-21 ENCOUNTER — Telehealth: Payer: Self-pay | Admitting: Physician Assistant

## 2016-03-21 ENCOUNTER — Ambulatory Visit (INDEPENDENT_AMBULATORY_CARE_PROVIDER_SITE_OTHER): Payer: Medicare Other | Admitting: Physician Assistant

## 2016-03-21 ENCOUNTER — Encounter: Payer: Self-pay | Admitting: Emergency Medicine

## 2016-03-21 VITALS — BP 110/68 | HR 88 | Temp 98.3°F | Resp 16 | Ht 66.0 in | Wt 208.0 lb

## 2016-03-21 DIAGNOSIS — R109 Unspecified abdominal pain: Secondary | ICD-10-CM

## 2016-03-21 DIAGNOSIS — D696 Thrombocytopenia, unspecified: Secondary | ICD-10-CM

## 2016-03-21 DIAGNOSIS — R0902 Hypoxemia: Secondary | ICD-10-CM | POA: Diagnosis not present

## 2016-03-21 DIAGNOSIS — J189 Pneumonia, unspecified organism: Secondary | ICD-10-CM | POA: Diagnosis not present

## 2016-03-21 NOTE — Telephone Encounter (Signed)
Yes absolutely. She is excused from work until next Wednesday while she recovers from her pneumonia and influenza.

## 2016-03-21 NOTE — Telephone Encounter (Signed)
Completed the work note and ready up front for pick up.

## 2016-03-21 NOTE — Progress Notes (Signed)
Patient presents to clinic today for hospital follow-up. Patient presented to ER on 12/30 with c/o fever, cough, wheezing and flank pain. Patient was seen a few days before at [facility] and was diagnosed with viral URI with cough. Symptoms had acutely worsened since that visit. ER workup included vitals revealing hypoxia (84% RA), tachycardia (110s). CXR revealed lingular infiltrate. CBC with thrombocytopenia. UA with suggestion of infection. Patient subsequently started on )2, IV fluids, empiric ABX and admitted to hospital for further treatment due to hypoxia. Patient positive for influenza on admission testing. Patient started on [place]. Patient stabilized and discharged to complete Tamiflu and 7 day course of Levaquin. Urine culture negative for infection.    Since discharge, patient endorses doing well. Denies fever, chills or fatigue. Some residual cough that is very mild and non-productive. Denies chest pain or SOB. Denies urinary urgency, frequency, dysuria, hematuria or flank pain. Denies new or worsening symptoms. Is taking Tamiflu and Levaquin as directed. Finishes Tamiflu this evening and Levaquin in 2 days.   Past Medical History:  Diagnosis Date  . Anxiety   . Bipolar affective disorder, mixed, in partial or remission   . Depression   . Dysrhythmia    Left sided heart diease  . Headache(784.0)   . History of gallstones   . Kidney stones   . Mental disorder   . Migraine   . Pneumonia    Hx: of as a child  . PONV (postoperative nausea and vomiting)   . Renal disorder   . Sleep apnea    sleep test 2014, did not have osa  . Substance abuse    Fentanyl, Percocet. last use 04/2011    Current Outpatient Prescriptions on File Prior to Visit  Medication Sig Dispense Refill  . albuterol (PROVENTIL HFA;VENTOLIN HFA) 108 (90 Base) MCG/ACT inhaler Inhale 1-2 puffs into the lungs every 6 (six) hours as needed for wheezing or shortness of breath. 1 Inhaler 0  . benzonatate (TESSALON) 100 MG  capsule Take 1 capsule (100 mg total) by mouth every 8 (eight) hours. 21 capsule 0  . levofloxacin (LEVAQUIN) 750 MG tablet Take 1 tablet (750 mg total) by mouth daily. 5 tablet 0  . lithium carbonate 300 MG capsule Take 600 mg by mouth at bedtime.     Marland Kitchen oseltamivir (TAMIFLU) 75 MG capsule Take 1 capsule (75 mg total) by mouth 2 (two) times daily. 6 capsule 0  . QUEtiapine (SEROQUEL) 400 MG tablet Take 2 tablets (800 mg total) by mouth at bedtime. For mood control 60 tablet 0  . tolterodine (DETROL LA) 2 MG 24 hr capsule Take 1 capsule (2 mg total) by mouth daily. 30 capsule 11  . traZODone (DESYREL) 50 MG tablet Take 100 mg by mouth daily.     Marland Kitchen zolpidem (AMBIEN) 10 MG tablet Take 10 mg by mouth at bedtime.     No current facility-administered medications on file prior to visit.     No Known Allergies  Family History  Problem Relation Age of Onset  . Colon cancer Father 23  . Breast cancer Father   . Breast cancer Mother   . Uterine cancer Mother   . Stomach cancer Neg Hx   . Rectal cancer Neg Hx   . Esophageal cancer Neg Hx     Social History   Social History  . Marital status: Married    Spouse name: N/A  . Number of children: 6  . Years of education: N/A   Occupational History  .  nursing student    Social History Main Topics  . Smoking status: Never Smoker  . Smokeless tobacco: Never Used  . Alcohol use No  . Drug use:     Types: Other-see comments, Fentanyl, Hydrocodone     Comment: see triage note  . Sexual activity: Not Asked   Other Topics Concern  . 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  All other ROS are negative.  BP 110/68   Pulse 88   Temp 98.3 F (36.8  C) (Oral)   Resp 16   Ht 5' 6"  (1.676 m)   Wt 208 lb (94.3 kg)   SpO2 97%   BMI 33.57 kg/m   Physical Exam  Constitutional: She is oriented to person, place, and time and well-developed, well-nourished, and in no distress.  HENT:  Head: Normocephalic and atraumatic.  Right Ear: External ear normal.  Left Ear: External ear normal.  Nose: Nose normal.  Mouth/Throat: Oropharynx is clear and moist. No oropharyngeal exudate.  TM within normal limits bilaterally.  Eyes: Conjunctivae are normal.  Neck: Neck supple.  Cardiovascular: Normal rate, regular rhythm, normal heart sounds and intact distal pulses.   Pulmonary/Chest: Effort normal and breath sounds normal. No respiratory distress. She has no wheezes. She has no rales. She exhibits no tenderness.  Abdominal: Soft. Bowel sounds are normal. She exhibits no distension. There is no tenderness.  Lymphadenopathy:    She has no cervical adenopathy.  Neurological: She is alert and oriented to person, place, and time.  Skin: Skin is warm and dry.  Psychiatric: Affect normal.  Vitals reviewed.   Recent Results (from the past 2160 hour(s))  Urinalysis, Routine w reflex microscopic     Status: Abnormal   Collection Time: 03/15/16  4:18 PM  Result Value Ref Range   Color, Urine YELLOW YELLOW   APPearance CLOUDY (A) CLEAR   Specific Gravity, Urine 1.021 1.005 - 1.030   pH 6.5 5.0 - 8.0   Glucose, UA NEGATIVE NEGATIVE mg/dL   Hgb urine dipstick NEGATIVE NEGATIVE   Bilirubin Urine NEGATIVE NEGATIVE   Ketones, ur NEGATIVE NEGATIVE mg/dL   Protein, ur NEGATIVE NEGATIVE mg/dL   Nitrite NEGATIVE NEGATIVE   Leukocytes, UA MODERATE (A) NEGATIVE  Urinalysis, Microscopic (reflex)     Status: Abnormal   Collection Time: 03/15/16  4:18 PM  Result Value Ref Range   RBC / HPF 0-5 0 - 5 RBC/hpf   WBC, UA 6-30 0 - 5 WBC/hpf   Bacteria, UA MANY (A) NONE SEEN   Squamous Epithelial / LPF 0-5 (A) NONE SEEN   Mucous PRESENT   Lipase, blood      Status: None   Collection Time: 03/15/16  8:55 PM  Result Value Ref Range   Lipase 26 11 - 51 U/L  Comprehensive metabolic panel     Status: Abnormal   Collection Time: 03/15/16  8:55 PM  Result Value Ref Range   Sodium 138 135 - 145 mmol/L   Potassium 3.6 3.5 - 5.1 mmol/L   Chloride 103 101 - 111 mmol/L   CO2 25  22 - 32 mmol/L   Glucose, Bld 106 (H) 65 - 99 mg/dL   BUN 9 6 - 20 mg/dL   Creatinine, Ser 0.90 0.44 - 1.00 mg/dL   Calcium 9.4 8.9 - 10.3 mg/dL   Total Protein 7.4 6.5 - 8.1 g/dL   Albumin 4.5 3.5 - 5.0 g/dL   AST 21 15 - 41 U/L   ALT 24 14 - 54 U/L   Alkaline Phosphatase 58 38 - 126 U/L   Total Bilirubin 0.6 0.3 - 1.2 mg/dL   GFR calc non Af Amer >60 >60 mL/min   GFR calc Af Amer >60 >60 mL/min    Comment: (NOTE) The eGFR has been calculated using the CKD EPI equation. This calculation has not been validated in all clinical situations. eGFR's persistently <60 mL/min signify possible Chronic Kidney Disease.    Anion gap 10 5 - 15  CBC     Status: Abnormal   Collection Time: 03/15/16  8:55 PM  Result Value Ref Range   WBC 5.2 4.0 - 10.5 K/uL   RBC 4.15 3.87 - 5.11 MIL/uL   Hemoglobin 12.5 12.0 - 15.0 g/dL   HCT 37.7 36.0 - 46.0 %   MCV 90.8 78.0 - 100.0 fL   MCH 30.1 26.0 - 34.0 pg   MCHC 33.2 30.0 - 36.0 g/dL   RDW 13.3 11.5 - 15.5 %   Platelets 123 (L) 150 - 400 K/uL  HIV antibody     Status: None   Collection Time: 03/16/16  2:24 AM  Result Value Ref Range   HIV Screen 4th Generation wRfx Non Reactive Non Reactive    Comment: (NOTE) Performed At: [facility] [place], [place] [id number] Lindon Romp MD ZR:[id number]   Basic metabolic panel     Status: Abnormal   Collection Time: 03/16/16  2:24 AM  Result Value Ref Range   Sodium 138 135 - 145 mmol/L   Potassium 3.6 3.5 - 5.1 mmol/L   Chloride 105 101 - 111 mmol/L   CO2 26 22 - 32 mmol/L   Glucose, Bld 118 (H) 65 - 99 mg/dL   BUN 7 6 - 20 mg/dL   Creatinine, Ser 0.83  0.44 - 1.00 mg/dL   Calcium 8.5 (L) 8.9 - 10.3 mg/dL   GFR calc non Af Amer >60 >60 mL/min   GFR calc Af Amer >60 >60 mL/min    Comment: (NOTE) The eGFR has been calculated using the CKD EPI equation. This calculation has not been validated in all clinical situations. eGFR's persistently <60 mL/min signify possible Chronic Kidney Disease.    Anion gap 7 5 - 15  Lithium level     Status: Abnormal   Collection Time: 03/16/16  2:24 AM  Result Value Ref Range   Lithium Lvl 0.47 (L) 0.60 - 1.20 mmol/L  Respiratory Panel by PCR     Status: Abnormal   Collection Time: 03/16/16  7:53 AM  Result Value Ref Range   Adenovirus NOT DETECTED NOT DETECTED   Coronavirus 229E NOT DETECTED NOT DETECTED   Coronavirus HKU1 NOT DETECTED NOT DETECTED   Coronavirus NL63 NOT DETECTED NOT DETECTED   Coronavirus OC43 NOT DETECTED NOT DETECTED   Metapneumovirus NOT DETECTED NOT DETECTED   Rhinovirus / Enterovirus NOT DETECTED NOT DETECTED   Influenza A H3 DETECTED (A) NOT DETECTED   Influenza B NOT DETECTED NOT DETECTED   Parainfluenza Virus 1 NOT DETECTED NOT DETECTED   Parainfluenza Virus 2 NOT DETECTED NOT  DETECTED   Parainfluenza Virus 3 NOT DETECTED NOT DETECTED   Parainfluenza Virus 4 NOT DETECTED NOT DETECTED   Respiratory Syncytial Virus NOT DETECTED NOT DETECTED   Bordetella pertussis NOT DETECTED NOT DETECTED   Chlamydophila pneumoniae NOT DETECTED NOT DETECTED   Mycoplasma pneumoniae NOT DETECTED NOT DETECTED  Influenza panel by PCR (type A & B, H1N1)     Status: Abnormal   Collection Time: 03/16/16  7:53 AM  Result Value Ref Range   Influenza A By PCR POSITIVE (A) NEGATIVE   Influenza B By PCR NEGATIVE NEGATIVE    Comment: (NOTE) The Xpert Xpress Flu assay is intended as an aid in the diagnosis of  influenza and should not be used as a sole basis for treatment.  This  assay is FDA approved for nasopharyngeal swab specimens only. Nasal  washings and aspirates are unacceptable for Xpert  Xpress Flu testing.   Strep pneumoniae urinary antigen     Status: None   Collection Time: 03/16/16  1:18 PM  Result Value Ref Range   Strep Pneumo Urinary Antigen NEGATIVE NEGATIVE    Comment:        Infection due to S. pneumoniae cannot be absolutely ruled out since the antigen present may be below the detection limit of the test.   Urine culture     Status: Abnormal   Collection Time: 03/16/16  1:18 PM  Result Value Ref Range   Specimen Description URINE, CLEAN CATCH    Special Requests NONE    Culture <10,000 COLONIES/mL INSIGNIFICANT GROWTH (A)    Report Status 03/17/2016 FINAL   CBC     Status: Abnormal   Collection Time: 03/18/16  1:07 PM  Result Value Ref Range   WBC 2.9 (L) 4.0 - 10.5 K/uL   RBC 4.28 3.87 - 5.11 MIL/uL   Hemoglobin 12.7 12.0 - 15.0 g/dL   HCT 38.8 36.0 - 46.0 %   MCV 90.7 78.0 - 100.0 fL   MCH 29.7 26.0 - 34.0 pg   MCHC 32.7 30.0 - 36.0 g/dL   RDW 13.0 11.5 - 15.5 %   Platelets 132 (L) 150 - 400 K/uL    Assessment/Plan: 1. Lingular pneumonia Treatment with Levaquin. Has 2 more days of medication. Vitals stable today. Lungs CTAB. Patient doing extremely well. Finish ABX. Continue supportive measures and OTC medications. Strict return precautions given.   2. Hypoxia Resolved during admission. O2 97% on RA. Lungs CTAB.   3. Flank pain Resolved. Unclear etiology as Urine culture negative for infection. However Levaquin of pneumonia would certainly treat UTI. No residual symptoms. Nothing further needed at present.   4. Thrombocytopenia ([place]) Noted on labs during admission. Will repeat CBC 1 week to assess stability.    Leeanne Rio, PA-C

## 2016-03-21 NOTE — Patient Instructions (Signed)
Please stay well-hydrated and get plenty of rest. Start some plain Mucinex.  Finish the Levaquin and Tamiflu as directed.  Follow-up for repeat labs in 1 week so we can recheck platelet count.  Please schedule an appointment for a complete physical.   If you note any recurrence of symptoms or new symptoms, please return immediately.

## 2016-03-21 NOTE — Telephone Encounter (Signed)
Pt states that she was to get a note for work to return on weds and she did not get it from [place] at today's visit, please advise pt when ready for p/u

## 2016-03-21 NOTE — Telephone Encounter (Signed)
Is ok for note for patient

## 2016-03-21 NOTE — Progress Notes (Signed)
Pre visit review using our clinic review tool, if applicable. No additional management support is needed unless otherwise documented below in the visit note. 

## 2016-03-28 ENCOUNTER — Other Ambulatory Visit: Payer: Self-pay

## 2016-04-04 ENCOUNTER — Encounter: Payer: Self-pay | Admitting: Physician Assistant

## 2016-04-04 ENCOUNTER — Ambulatory Visit (INDEPENDENT_AMBULATORY_CARE_PROVIDER_SITE_OTHER): Payer: Medicare Other | Admitting: Physician Assistant

## 2016-04-04 VITALS — BP 112/78 | HR 100 | Temp 97.7°F | Resp 16 | Ht 66.0 in | Wt 211.0 lb

## 2016-04-04 DIAGNOSIS — G894 Chronic pain syndrome: Secondary | ICD-10-CM

## 2016-04-04 NOTE — Patient Instructions (Signed)
I am working on replacing the referral to Dr. Hardin Negus with pain management now that your insurance has changed.  Please continue chronic medications as directed by specialist.  Schedule an appointment at your earliest convenience for a complete physical

## 2016-04-04 NOTE — Progress Notes (Signed)
Pre visit review using our clinic review tool, if applicable. No additional management support is needed unless otherwise documented below in the visit note. 

## 2016-04-04 NOTE — Progress Notes (Signed)
Patient presents to clinic today to discuss referral to pain management. 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 Patient with history of drug overdose, intentional. Cannot receive controlled medications from this office. Is also followed by Psychiatry for Bipolar Disorder. Is on multi drug regimen including Lithium and Seroquel.  Patient endorses aching and stinging pain. Occasional swelling at site of amputation. Denies redness or warmth. Denies decreased ROM. Denies ankle pain.   Past Medical History:  Diagnosis Date  . Anxiety   . Bipolar affective disorder, mixed, in partial or remission   . Depression   . Dysrhythmia    Left sided heart diease  . Headache(784.0)   . History of gallstones   . Kidney stones   . Mental disorder   . Migraine   . Pneumonia    Hx: of as a child  . PONV (postoperative nausea and vomiting)   . Renal disorder   . Sleep apnea    sleep test 2014, did not have osa  . Substance abuse    Fentanyl, Percocet. last use 04/2011    Current Outpatient Prescriptions on File Prior to Visit  Medication Sig Dispense Refill  . albuterol (PROVENTIL HFA;VENTOLIN HFA) 108 (90 Base) MCG/ACT inhaler Inhale 1-2 puffs into the lungs every 6 (six) hours as needed for wheezing or shortness of breath. 1 Inhaler 0  . benzonatate (TESSALON) 100 MG capsule Take 1 capsule (100 mg total) by mouth every 8 (eight) hours. 21 capsule 0  . lithium carbonate 300 MG capsule Take 600 mg by mouth at bedtime.     Marland Kitchen QUEtiapine (SEROQUEL) 400 MG tablet Take 2 tablets (800 mg total) by mouth at bedtime. For mood control 60 tablet 0  . tolterodine (DETROL LA) 2 MG 24 hr capsule Take 1 capsule (2 mg total) by mouth daily. 30 capsule 11  . traZODone (DESYREL) 50 MG tablet Take 100 mg by mouth daily.     Marland Kitchen zolpidem (AMBIEN) 10 MG tablet Take 10 mg by mouth at bedtime.     No current  facility-administered medications on file prior to visit.     No Known Allergies  Family History  Problem Relation Age of Onset  . Colon cancer Father 74  . Breast cancer Father   . Breast cancer Mother   . Uterine cancer Mother   . Stomach cancer Neg Hx   . Rectal cancer Neg Hx   . Esophageal cancer Neg Hx     Social History   Social History  . Marital status: Married    Spouse name: N/A  . Number of children: 6  . Years of education: N/A   Occupational History  . nursing student    Social History Main Topics  . Smoking status: Never Smoker  . Smokeless tobacco: Never Used  . Alcohol use No  . Drug use: Yes    Types: Other-see comments, Fentanyl, Hydrocodone     Comment: see triage note  . Sexual activity: Not Asked   Other Topics Concern  . 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  All other ROS are negative.  There were no vitals taken for this visit.  Physical Exam  Constitutional: She is oriented to person, place, and time and well-developed, well-nourished, and in no distress.  HENT:  Head: Normocephalic and atraumatic.  Eyes: Conjunctivae are normal.  Neck: Neck supple.  Cardiovascular: Normal rate, regular rhythm, normal heart sounds and intact distal pulses.   Pulmonary/Chest: Effort normal.  Musculoskeletal:  R great toe surgically absent. No evidence of erythema, swelling, decreased range of motion. No pain reproduced with examination.   Neurological: She is alert and oriented to person, place, and time.  Skin: Skin is warm and dry. No rash noted.  Psychiatric: Affect normal.  Vitals reviewed.   Recent Results (from the past 2160 hour(s))  Urinalysis,  Routine w reflex microscopic     Status: Abnormal   Collection Time: 03/15/16  4:18 PM  Result Value Ref Range   Color, Urine YELLOW YELLOW   APPearance CLOUDY (A) CLEAR   Specific Gravity, Urine 1.021 1.005 - 1.030   pH 6.5 5.0 - 8.0   Glucose, UA NEGATIVE NEGATIVE mg/dL   Hgb urine dipstick NEGATIVE NEGATIVE   Bilirubin Urine NEGATIVE NEGATIVE   Ketones, ur NEGATIVE NEGATIVE mg/dL   Protein, ur NEGATIVE NEGATIVE mg/dL   Nitrite NEGATIVE NEGATIVE   Leukocytes, UA MODERATE (A) NEGATIVE  Urinalysis, Microscopic (reflex)     Status: Abnormal   Collection Time: 03/15/16  4:18 PM  Result Value Ref Range   RBC / HPF 0-5 0 - 5 RBC/hpf   WBC, UA 6-30 0 - 5 WBC/hpf   Bacteria, UA MANY (A) NONE SEEN   Squamous Epithelial / LPF 0-5 (A) NONE SEEN   Mucous PRESENT   Lipase, blood     Status: None   Collection Time: 03/15/16  8:55 PM  Result Value Ref Range   Lipase 26 11 - 51 U/L  Comprehensive metabolic panel     Status: Abnormal   Collection Time: 03/15/16  8:55 PM  Result Value Ref Range   Sodium 138 135 - 145 mmol/L   Potassium 3.6 3.5 - 5.1 mmol/L   Chloride 103 101 - 111 mmol/L   CO2 25 22 - 32 mmol/L   Glucose, Bld 106 (H) 65 - 99 mg/dL   BUN 9 6 - 20 mg/dL   Creatinine, Ser 0.90 0.44 - 1.00 mg/dL   Calcium 9.4 8.9 - 10.3 mg/dL   Total Protein 7.4 6.5 - 8.1 g/dL   Albumin 4.5 3.5 - 5.0 g/dL   AST 21 15 - 41 U/L   ALT 24 14 - 54 U/L   Alkaline Phosphatase 58 38 - 126 U/L   Total Bilirubin 0.6 0.3 - 1.2 mg/dL   GFR calc non Af Amer >60 >60 mL/min   GFR calc Af Amer >60 >60 mL/min    Comment: (NOTE) The eGFR has been calculated using the CKD EPI equation. This calculation has not been validated in all clinical situations. eGFR's persistently <60 mL/min signify possible Chronic Kidney Disease.    Anion gap 10 5 - 15  CBC     Status: Abnormal   Collection Time: 03/15/16  8:55 PM  Result Value Ref Range   WBC 5.2 4.0 - 10.5 K/uL   RBC 4.15 3.87 - 5.11 MIL/uL   Hemoglobin  12.5 12.0 - 15.0 g/dL   HCT 37.7 36.0 - 46.0 %   MCV  90.8 78.0 - 100.0 fL   MCH 30.1 26.0 - 34.0 pg   MCHC 33.2 30.0 - 36.0 g/dL   RDW 13.3 11.5 - 15.5 %   Platelets 123 (L) 150 - 400 K/uL  HIV antibody     Status: None   Collection Time: 03/16/16  2:24 AM  Result Value Ref Range   HIV Screen 4th Generation wRfx Non Reactive Non Reactive    Comment: (NOTE) Performed At: [facility] [place], [place] [id number] Lindon Romp MD UK:[id number]   Basic metabolic panel     Status: Abnormal   Collection Time: 03/16/16  2:24 AM  Result Value Ref Range   Sodium 138 135 - 145 mmol/L   Potassium 3.6 3.5 - 5.1 mmol/L   Chloride 105 101 - 111 mmol/L   CO2 26 22 - 32 mmol/L   Glucose, Bld 118 (H) 65 - 99 mg/dL   BUN 7 6 - 20 mg/dL   Creatinine, Ser 0.83 0.44 - 1.00 mg/dL   Calcium 8.5 (L) 8.9 - 10.3 mg/dL   GFR calc non Af Amer >60 >60 mL/min   GFR calc Af Amer >60 >60 mL/min    Comment: (NOTE) The eGFR has been calculated using the CKD EPI equation. This calculation has not been validated in all clinical situations. eGFR's persistently <60 mL/min signify possible Chronic Kidney Disease.    Anion gap 7 5 - 15  Lithium level     Status: Abnormal   Collection Time: 03/16/16  2:24 AM  Result Value Ref Range   Lithium Lvl 0.47 (L) 0.60 - 1.20 mmol/L  Respiratory Panel by PCR     Status: Abnormal   Collection Time: 03/16/16  7:53 AM  Result Value Ref Range   Adenovirus NOT DETECTED NOT DETECTED   Coronavirus 229E NOT DETECTED NOT DETECTED   Coronavirus HKU1 NOT DETECTED NOT DETECTED   Coronavirus NL63 NOT DETECTED NOT DETECTED   Coronavirus OC43 NOT DETECTED NOT DETECTED   Metapneumovirus NOT DETECTED NOT DETECTED   Rhinovirus / Enterovirus NOT DETECTED NOT DETECTED   Influenza A H3 DETECTED (A) NOT DETECTED   Influenza B NOT DETECTED NOT DETECTED   Parainfluenza Virus 1 NOT DETECTED NOT DETECTED   Parainfluenza Virus 2 NOT DETECTED NOT DETECTED    Parainfluenza Virus 3 NOT DETECTED NOT DETECTED   Parainfluenza Virus 4 NOT DETECTED NOT DETECTED   Respiratory Syncytial Virus NOT DETECTED NOT DETECTED   Bordetella pertussis NOT DETECTED NOT DETECTED   Chlamydophila pneumoniae NOT DETECTED NOT DETECTED   Mycoplasma pneumoniae NOT DETECTED NOT DETECTED  Influenza panel by PCR (type A & B, H1N1)     Status: Abnormal   Collection Time: 03/16/16  7:53 AM  Result Value Ref Range   Influenza A By PCR POSITIVE (A) NEGATIVE   Influenza B By PCR NEGATIVE NEGATIVE    Comment: (NOTE) The Xpert Xpress Flu assay is intended as an aid in the diagnosis of  influenza and should not be used as a sole basis for treatment.  This  assay is FDA approved for nasopharyngeal swab specimens only. Nasal  washings and aspirates are unacceptable for Xpert Xpress Flu testing.   Strep pneumoniae urinary antigen     Status: None   Collection Time: 03/16/16  1:18 PM  Result Value Ref Range   Strep Pneumo Urinary Antigen NEGATIVE NEGATIVE    Comment:        Infection due to S. pneumoniae cannot be absolutely ruled out  since the antigen present may be below the detection limit of the test.   Urine culture     Status: Abnormal   Collection Time: 03/16/16  1:18 PM  Result Value Ref Range   Specimen Description URINE, CLEAN CATCH    Special Requests NONE    Culture <10,000 COLONIES/mL INSIGNIFICANT GROWTH (A)    Report Status 03/17/2016 FINAL   CBC     Status: Abnormal   Collection Time: 03/18/16  1:07 PM  Result Value Ref Range   WBC 2.9 (L) 4.0 - 10.5 K/uL   RBC 4.28 3.87 - 5.11 MIL/uL   Hemoglobin 12.7 12.0 - 15.0 g/dL   HCT 38.8 36.0 - 46.0 %   MCV 90.7 78.0 - 100.0 fL   MCH 29.7 26.0 - 34.0 pg   MCHC 32.7 30.0 - 36.0 g/dL   RDW 13.0 11.5 - 15.5 %   Platelets 132 (L) 150 - 400 K/uL    Assessment/Plan: 1. Chronic pain syndrome Referral to Pain management placed. Will not prescribe narcotics giving patient history of drug overdose. Also patient on  multiple psychiatric medications. Hesitant to add on a Gabapentin, etc to regimen giving this. She is to discuss with pain management and psychiatric.  - Ambulatory referral to Pain Clinic   Leeanne Rio, [place]

## 2016-04-10 ENCOUNTER — Other Ambulatory Visit: Payer: Self-pay | Admitting: Physician Assistant

## 2016-04-10 ENCOUNTER — Telehealth: Payer: Self-pay | Admitting: Physician Assistant

## 2016-04-10 DIAGNOSIS — G894 Chronic pain syndrome: Secondary | ICD-10-CM

## 2016-04-10 NOTE — Telephone Encounter (Signed)
Patient calling to report she was previously referred to pain management for her foot.  However, she was informed pain management would not see her.  She is requesting a referral to see Dr. Sharol Given instead.

## 2016-04-10 NOTE — Telephone Encounter (Signed)
He is an Doctor, general practice. He will definitely be able to address concerns but not sure if he will prescribe pain medication. If patient still wanting to see him ok to place referral

## 2016-04-10 NOTE — Telephone Encounter (Signed)
Does Dr. Sharol Given handle pain management and will redo the referral

## 2016-04-11 NOTE — Telephone Encounter (Signed)
Referral placed and will see if he will see patient for pain management

## 2016-04-16 ENCOUNTER — Ambulatory Visit (INDEPENDENT_AMBULATORY_CARE_PROVIDER_SITE_OTHER): Payer: Medicare Other | Admitting: Orthopedic Surgery

## 2016-04-18 ENCOUNTER — Ambulatory Visit (INDEPENDENT_AMBULATORY_CARE_PROVIDER_SITE_OTHER): Payer: Medicare Other | Admitting: Orthopedic Surgery

## 2016-04-18 VITALS — Ht 66.0 in | Wt 211.0 lb

## 2016-04-18 DIAGNOSIS — M6701 Short Achilles tendon (acquired), right ankle: Secondary | ICD-10-CM | POA: Insufficient documentation

## 2016-04-18 NOTE — Progress Notes (Signed)
Office Visit Note   Patient: [name]           Date of Birth: January 19, 1966           MRN: [id number] Visit Date: 04/18/2016              Requested by: Brunetta Jeans, PA-C [address] [place], [place] [address] PCP: Leeanne Rio, PA-C  Chief Complaint  Patient presents with  . Right Foot - Pain    HPI: Patient has a history of right  Great toe amputation. She states that the remainder of her toes are painful to touch and have been for " a long time" There is no redness and no swelling, the skin is intact and there has been no injury. The pt is in regular shoes and is full weight bearing. Autumn L Forrest, RMA  Patient complains of pain across the entire forefoot with weightbearing.  Assessment & Plan: Visit Diagnoses:  1. Achilles tendon contracture, right     Plan: Patient was given instructions for heel cord stretching to do 5 times a day minute at a time this was demonstrated to her she states she understands. Patient will be in [place] for the next 3 months. If she is still symptomatic when she returns she will follow up and we will plan for a gastrocnemius recession on the right.  Follow-Up Instructions: Return if symptoms worsen or fail to improve.   Ortho Exam Examination patient is alert oriented no adenopathy well-dressed normal affect normal respiratory effort she has an antalgic gait she has a good dorsalis pedis pulse she has a well-healed great toe amputation the right there is no venous stasis ulcer plantar ulcers she does have some callus across the forefoot. With her knee extended patient has dorsiflexion about 20 short of neutral with extreme heel cord contracture.  Imaging: No results found.  Orders:  No orders of the defined types were placed in this encounter.  No orders of the defined types were placed in this encounter.    Procedures: No procedures performed  Clinical Data: No additional findings.  Subjective: Review of  Systems  Objective: Vital Signs: Ht 5\' 6"  (1.676 m)   Wt 211 lb (95.7 kg)   BMI 34.06 kg/m   Specialty Comments:  No specialty comments available.  PMFS History: Patient Active Problem List   Diagnosis Date Noted  . Achilles tendon contracture, right 04/18/2016  . Acute respiratory failure with hypoxia ([place]) 03/16/2016  . Right flank pain 03/16/2016  . Influenza with pneumonia 03/16/2016  . Acute pyelonephritis   . Influenza with respiratory manifestation   . OAB (overactive bladder) 02/28/2016  . Urgency incontinence 02/28/2016  . Situational anxiety 06/24/2015  . Cellulitis of scalp 04/27/2015  . Status post nail surgery 04/17/2015  . Mass of arm 04/09/2015  . Viral bronchitis 04/09/2015  . Deviated septum 11/17/2014  . Paronychia 09/02/2014  . Gastroesophageal reflux disease without esophagitis 09/02/2014  . Chronic pain syndrome 05/24/2014  . Bereavement 03/13/2014  . Bipolar affective disorder, depressed, severe ([place]) 03/11/2014  . Opiate dependence ([place]) 03/11/2014  . Intentional opiate overdose ([place]) 03/11/2014  . De Quervain's tenosynovitis, left 12/12/2013  . Right-sided low back pain without sciatica 09/14/2013  . Immunity status testing 08/10/2013  . Nausea with vomiting 07/28/2013  . Thrombocytopenia, unspecified 07/28/2013  . Community acquired pneumonia 06/30/2013  . Hypoxia 06/30/2013  . External hemorrhoids 02/16/2013  . Cervical lymphadenopathy 10/12/2012  . Tension headache 10/12/2012  .  Ingrown left greater toenail 08/25/2012  . Infection of left foot 08/25/2012  . Schizoaffective disorder, bipolar type ([place]) 03/31/2011  . Bipolar 1 disorder ([place])    Past Medical History:  Diagnosis Date  . Anxiety   . Bipolar affective disorder, mixed, in partial or remission   . Depression   . Dysrhythmia    Left sided heart diease  . Headache(784.0)   . History of gallstones   . Kidney stones   . Mental disorder   . Migraine   . Pneumonia    Hx: of  as a child  . PONV (postoperative nausea and vomiting)   . Renal disorder   . Sleep apnea    sleep test 2014, did not have osa  . Substance abuse    Fentanyl, Percocet. last use 04/2011    Family History  Problem Relation Age of Onset  . Colon cancer Father 36  . Breast cancer Father   . Breast cancer Mother   . Uterine cancer Mother   . Stomach cancer Neg Hx   . Rectal cancer Neg Hx   . Esophageal cancer Neg Hx     Past Surgical History:  Procedure Laterality Date  . ABDOMINAL HYSTERECTOMY    . AMPUTATION Right 09/15/2012   Procedure: AMPUTATION DIGIT- right ;  Surgeon: Newt Minion, MD;  Location: [facility];  Service: Orthopedics;  Laterality: Right;  Right Great Toe Amputation at MTP (metatarsophalangeal)  . BUNIONECTOMY     11/14/2011  . CHOLECYSTECTOMY    . COLONOSCOPY     2001, 2008.  Father has colon cancer  . FOOT SURGERY     right x 2  . NASAL SEPTOPLASTY W/ TURBINOPLASTY Bilateral 12/04/2014   Procedure: NASAL SEPTOPLASTY WITH BILATERAL TURBINATE REDUCTION;  Surgeon: Izora Gala, MD;  Location: [facility];  Service: ENT;  Laterality: Bilateral;  . TOTAL ABDOMINAL HYSTERECTOMY W/ BILATERAL SALPINGOOPHORECTOMY    . VAGINAL HYSTERECTOMY     Social History   Occupational History  . nursing student    Social History Main Topics  . Smoking status: Never Smoker  . Smokeless tobacco: Never Used  . Alcohol use No  . Drug use: Yes    Types: Other-see comments, Fentanyl, Hydrocodone     Comment: see triage note  . Sexual activity: Not on file

## 2016-05-06 ENCOUNTER — Telehealth: Payer: Self-pay | Admitting: Physician Assistant

## 2016-05-06 NOTE — Telephone Encounter (Signed)
Received copy of lab results drawn by Psychiatry. Shows her TSH level is elevated meaning thyroid is underactive. This can occur from long-term lithium use. Did her psychiatrist call her and discuss results? Did they change her regimen? If not, she needs an appointment for further labs and discussion of next steps. Concern is, she and her husband were moving out of the country (noted at last visit). I am hoping they are still in the [place] or at least have an active phone number so we can get in touch with them.  Please try to reach out to patient.

## 2016-05-06 NOTE — Telephone Encounter (Signed)
Called patient mobile/home number and husband phone and both phones are accepting incoming calls.

## 2016-05-07 NOTE — Telephone Encounter (Signed)
Unable to reach patient as their phones are disconnected - moved out of country.  Reached out to [employer] [facility] [employer], PA-C) who is the provider caring for Mrs. Devalle to see if they had at least contacted patient with her results and next steps. States it seems his staff has not attempted to contact the patient, just faxed results to [place] for review.    Discussed with him that if they are drawing surveillance labs on patient regarding medications they prescribe, then they are responsible for following up with the patient. At the very least, they need to call the PCP to discuss findings and next steps. A fax without commentary is not sufficient as there is no guarantee that we will receive fax. Provider apologized for the lack of communication between his staff and patient.   Discussed that TSH elevation could very well be related to long-term lithium therapy. He recommends repeat TSH in 6 weeks. If still elevated they will wean lithium.  He states he will have his office continue to reach out to the patient.   We will do the same, unfortunately patient and her emergency contact (husband's) numbers have all been disconnected and I have no other numbers to contact.

## 2016-07-15 DIAGNOSIS — M6701 Short Achilles tendon (acquired), right ankle: Secondary | ICD-10-CM | POA: Diagnosis not present

## 2016-07-20 ENCOUNTER — Emergency Department (HOSPITAL_COMMUNITY)
Admission: EM | Admit: 2016-07-20 | Discharge: 2016-07-20 | Disposition: A | Discharge status: Home / Self Care | Payer: Medicare Other | Attending: Emergency Medicine | Admitting: Emergency Medicine

## 2016-07-20 ENCOUNTER — Encounter (HOSPITAL_COMMUNITY): Payer: Self-pay | Admitting: Emergency Medicine

## 2016-07-20 DIAGNOSIS — R110 Nausea: Secondary | ICD-10-CM | POA: Diagnosis present

## 2016-07-20 DIAGNOSIS — M79671 Pain in right foot: Secondary | ICD-10-CM | POA: Diagnosis not present

## 2016-07-20 DIAGNOSIS — Z79899 Other long term (current) drug therapy: Secondary | ICD-10-CM | POA: Diagnosis not present

## 2016-07-20 DIAGNOSIS — G8918 Other acute postprocedural pain: Secondary | ICD-10-CM | POA: Diagnosis not present

## 2016-07-20 MED ORDER — OXYCODONE-ACETAMINOPHEN 5-325 MG PO TABS: 1.0000 | ORAL_TABLET | Freq: Four times a day (QID) | ORAL | 0 refills | Status: DC | PRN

## 2016-07-20 MED ORDER — PROMETHAZINE HCL 25 MG PO TABS
25.0000 mg | ORAL_TABLET | Freq: Once | ORAL | Status: AC
  Administered 2016-07-20: 25 mg via ORAL
  Filled 2016-07-20: qty 1

## 2016-07-20 MED ORDER — OXYCODONE-ACETAMINOPHEN 5-325 MG PO TABS
1.0000 | ORAL_TABLET | Freq: Once | ORAL | Status: AC
  Administered 2016-07-20: 1 via ORAL
  Filled 2016-07-20: qty 1

## 2016-07-20 MED ORDER — PROMETHAZINE HCL 25 MG PO TABS: 25.0000 mg | ORAL_TABLET | Freq: Three times a day (TID) | ORAL | 0 refills | Status: DC | PRN

## 2016-07-20 NOTE — ED Notes (Signed)
Pt refused zofran in triage stating phenergan is the only thing that works for her.

## 2016-07-20 NOTE — ED Provider Notes (Signed)
[facility] DEPT Provider Note   CSN: [id number] Arrival date & time: 07/20/16  1854     History   Chief Complaint No chief complaint on file.   HPI CALEA HRIBAR is a 51 y.o. female.  Patient states this past Tuesday had procedure to lengthen right achilles, and that is out of her home pain medication, and has pain to foot/ankle area, and nausea. Pt requests phenergan for nausea and pain medication.  States she is due to see her surgeon later this week. Denies leg swelling. No spreading redness, no fever or chills.    The history is provided by the patient.    Past Medical History:  Diagnosis Date  . Anxiety   . Bipolar affective disorder, mixed, in partial or remission   . Depression   . Dysrhythmia    Left sided heart diease  . Headache(784.0)   . History of gallstones   . Kidney stones   . Mental disorder   . Migraine   . Pneumonia    Hx: of as a child  . PONV (postoperative nausea and vomiting)   . Renal disorder   . Sleep apnea    sleep test 2014, did not have osa  . Substance abuse    Fentanyl, Percocet. last use 04/2011    Patient Active Problem List   Diagnosis Date Noted  . Achilles tendon contracture, right 04/18/2016  . Acute respiratory failure with hypoxia ([place]) 03/16/2016  . Right flank pain 03/16/2016  . Influenza with pneumonia 03/16/2016  . Acute pyelonephritis   . Influenza with respiratory manifestation   . OAB (overactive bladder) 02/28/2016  . Urgency incontinence 02/28/2016  . Situational anxiety 06/24/2015  . Cellulitis of scalp 04/27/2015  . Status post nail surgery 04/17/2015  . Mass of arm 04/09/2015  . Viral bronchitis 04/09/2015  . Deviated septum 11/17/2014  . Paronychia 09/02/2014  . Gastroesophageal reflux disease without esophagitis 09/02/2014  . Chronic pain syndrome 05/24/2014  . Bereavement 03/13/2014  . Bipolar affective disorder, depressed, severe ([place]) 03/11/2014  . Opiate dependence ([place]) 03/11/2014  .  Intentional opiate overdose ([place]) 03/11/2014  . De Quervain's tenosynovitis, left 12/12/2013  . Right-sided low back pain without sciatica 09/14/2013  . Immunity status testing 08/10/2013  . Nausea with vomiting 07/28/2013  . Thrombocytopenia, unspecified ([place]) 07/28/2013  . Community acquired pneumonia 06/30/2013  . Hypoxia 06/30/2013  . External hemorrhoids 02/16/2013  . Cervical lymphadenopathy 10/12/2012  . Tension headache 10/12/2012  . Ingrown left greater toenail 08/25/2012  . Infection of left foot 08/25/2012  . Schizoaffective disorder, bipolar type ([place]) 03/31/2011  . Bipolar 1 disorder [facility])     Past Surgical History:  Procedure Laterality Date  . ABDOMINAL HYSTERECTOMY    . AMPUTATION Right 09/15/2012   Procedure: AMPUTATION DIGIT- right ;  Surgeon: Newt Minion, MD;  Location: [facility];  Service: Orthopedics;  Laterality: Right;  Right Great Toe Amputation at MTP (metatarsophalangeal)  . BUNIONECTOMY     11/14/2011  . CHOLECYSTECTOMY    . COLONOSCOPY     2001, 2008.  Father has colon cancer  . FOOT SURGERY     right x 2  . NASAL SEPTOPLASTY W/ TURBINOPLASTY Bilateral 12/04/2014   Procedure: NASAL SEPTOPLASTY WITH BILATERAL TURBINATE REDUCTION;  Surgeon: Izora Gala, MD;  Location: [facility];  Service: ENT;  Laterality: Bilateral;  . TOTAL ABDOMINAL HYSTERECTOMY W/ BILATERAL SALPINGOOPHORECTOMY    . VAGINAL HYSTERECTOMY      OB History    No  data available       Home Medications    Prior to Admission medications   Medication Sig Start Date End Date Taking? Authorizing Provider  albuterol (PROVENTIL HFA;VENTOLIN HFA) 108 (90 Base) MCG/ACT inhaler Inhale 1-2 puffs into the lungs every 6 (six) hours as needed for wheezing or shortness of breath. 03/14/16   Barnet Glasgow, NP  benzonatate (TESSALON) 100 MG capsule Take 1 capsule (100 mg total) by mouth every 8 (eight) hours. 03/14/16   Barnet Glasgow, NP  lithium carbonate 300 MG capsule Take 600  mg by mouth at bedtime.  05/12/14   [provider]  QUEtiapine (SEROQUEL) 400 MG tablet Take 2 tablets (800 mg total) by mouth at bedtime. For mood control 03/15/14   Lindell Spar I, NP  tolterodine (DETROL LA) 2 MG 24 hr capsule Take 1 capsule (2 mg total) by mouth daily. 02/28/16   Terrance Mass, MD  traZODone (DESYREL) 50 MG tablet Take 100 mg by mouth daily.  04/26/15   [provider]  zolpidem (AMBIEN) 10 MG tablet Take 10 mg by mouth at bedtime.    [provider]    Family History Family History  Problem Relation Age of Onset  . Colon cancer Father 70  . Breast cancer Father   . Breast cancer Mother   . Uterine cancer Mother   . Stomach cancer Neg Hx   . Rectal cancer Neg Hx   . Esophageal cancer Neg Hx     Social History Social History  Substance Use Topics  . Smoking status: Never Smoker  . Smokeless tobacco: Never Used  . Alcohol use No     Allergies   Patient has no known allergies.   Review of Systems Review of Systems  Constitutional: Negative for chills and fever.  Cardiovascular: Negative for leg swelling.  Gastrointestinal: Positive for nausea.  Neurological: Negative for numbness.     Physical Exam Updated Vital Signs BP 139/88 (BP Location: Left Arm)   Pulse 83   Temp 98.3 F (36.8 C) (Oral)   Resp 18   Ht 5\' 6"  (1.676 m)   Wt 95.3 kg   SpO2 100%   BMI 33.89 kg/m   Physical Exam  Constitutional: She appears well-developed and well-nourished. No distress.  Eyes: Conjunctivae are normal. No scleral icterus.  Neck: No tracheal deviation present.  Cardiovascular: Normal rate and intact distal pulses.   Pulmonary/Chest: Effort normal. No respiratory distress.  Abdominal: Normal appearance.  Musculoskeletal: She exhibits no edema.  Right leg surgical dressing intact. No spreading redness up leg or down foot. dp/pt 2+. Remote/prior toe amputation.   Neurological: She is alert.  Skin: Skin is warm and dry. No rash  noted. She is not diaphoretic.  Psychiatric: She has a normal mood and affect.  Nursing note and vitals reviewed.    ED Treatments / Results  Labs (all labs ordered are listed, but only abnormal results are displayed) Labs Reviewed - No data to display  EKG  EKG Interpretation None       Radiology No results found.  Procedures Procedures (including critical care time)  Medications Ordered in ED Medications  oxyCODONE-acetaminophen (PERCOCET/ROXICET) 5-325 MG per tablet 1 tablet (not administered)  promethazine (PHENERGAN) tablet 25 mg (not administered)     Initial Impression / Assessment and Plan / ED Course  I have reviewed the triage vital signs and the nursing notes.  Pertinent labs & imaging results that were available during my care of the patient  were reviewed by me and considered in my medical decision making (see chart for details).  Pt indicates has ride, does not have to drive. Percocet po. Phenergan po.  Patient currently appears stable for d/c.  Will give small quantity pain rx until can see her surgeon.     Final Clinical Impressions(s) / ED Diagnoses   Final diagnoses:  None    New Prescriptions New Prescriptions   No medications on file     Lajean Saver, MD 07/20/16 2046

## 2016-07-20 NOTE — ED Triage Notes (Signed)
Pt comes with complaints of nausea after having surgery on her foot last Tuesday. Pt states she ran out of pain medications over the weekend so that may be part of it too.  Pt A&O x4.  Ambulatory in triage.

## 2016-07-20 NOTE — Discharge Instructions (Signed)
It was our pleasure to provide your ER care today - we hope that you feel better.  Elevate your foot as much as possible.  Take motrin or aleve as need for pain.  You may also take percocet as need for pain. No driving for the next 6 hours or when taking percocet. Also, do not take tylenol or acetaminophen containing medication when taking percocet.   You may take phenergan as need for nausea - no driving when taking.   Follow up with your orthopedist this week.   Return to ER if worse, new symptoms, fevers, increased swelling, spreading redness, other concern.

## 2016-07-21 ENCOUNTER — Other Ambulatory Visit: Payer: Self-pay | Admitting: Physician Assistant

## 2016-07-21 DIAGNOSIS — R7989 Other specified abnormal findings of blood chemistry: Secondary | ICD-10-CM

## 2016-07-22 ENCOUNTER — Other Ambulatory Visit: Payer: Self-pay | Admitting: Physician Assistant

## 2016-07-22 ENCOUNTER — Other Ambulatory Visit: Payer: Self-pay

## 2016-07-22 DIAGNOSIS — R7989 Other specified abnormal findings of blood chemistry: Secondary | ICD-10-CM

## 2016-07-23 ENCOUNTER — Inpatient Hospital Stay (INDEPENDENT_AMBULATORY_CARE_PROVIDER_SITE_OTHER): Payer: Medicare Other | Admitting: Family

## 2016-07-29 ENCOUNTER — Telehealth (INDEPENDENT_AMBULATORY_CARE_PROVIDER_SITE_OTHER): Payer: Self-pay | Admitting: Radiology

## 2016-07-29 NOTE — Telephone Encounter (Signed)
Called and left a message for patient she did not show to 07/23/16 appointment for post op evaluate for gastroc recession. Advised she please call back and schedule follow up with [facility] or Dr. Sharol Given.

## 2016-07-30 ENCOUNTER — Encounter: Payer: Self-pay | Admitting: Gynecology

## 2016-08-22 ENCOUNTER — Telehealth (INDEPENDENT_AMBULATORY_CARE_PROVIDER_SITE_OTHER): Payer: Self-pay | Admitting: *Deleted

## 2016-08-22 NOTE — Telephone Encounter (Signed)
Pt called stated she is in [place] and stepped off the curb and ruptured her achillis tendon and had surgery on 5/22. She is debating rehab in [place] or back in the states but she just wanted Dr. Sharol Given to know this.

## 2016-08-26 ENCOUNTER — Telehealth: Payer: Self-pay | Admitting: Emergency Medicine

## 2016-08-26 NOTE — Telephone Encounter (Signed)
-----   Message from Brunetta Jeans, PA-C sent at 08/26/2016  8:32 AM EDT ----- Will you follow-up with patient. She never came in for TSH testing.   ----- Message ----- From: SYSTEM Sent: 08/26/2016  12:06 AM To: Brunetta Jeans, PA-C

## 2016-08-26 NOTE — Telephone Encounter (Signed)
LMOVM advising patient to call back about scheduling a lab visit for recheck TSH.

## 2016-09-26 ENCOUNTER — Ambulatory Visit (INDEPENDENT_AMBULATORY_CARE_PROVIDER_SITE_OTHER): Payer: Medicare Other | Admitting: Orthopedic Surgery

## 2016-09-26 ENCOUNTER — Encounter (INDEPENDENT_AMBULATORY_CARE_PROVIDER_SITE_OTHER): Payer: Self-pay | Admitting: Orthopedic Surgery

## 2016-09-26 VITALS — Ht 66.0 in | Wt 210.0 lb

## 2016-09-26 DIAGNOSIS — M6701 Short Achilles tendon (acquired), right ankle: Secondary | ICD-10-CM

## 2016-09-26 NOTE — Progress Notes (Signed)
Office Visit Note   Patient: [name]           Date of Birth: [birth year]           MRN: [id number] Visit Date: 09/26/2016              Requested by: Brunetta Jeans, PA-C [address] [place], [place] [address] PCP: Brunetta Jeans, PA-C  Chief Complaint  Patient presents with  . Right Foot - Follow-up    Pt ruptured achilles in [place] and had surgery 08/05/16 had surgery there.        HPI: Patient is a 76 woman who presents in follow-up for His contracture on the right. Patient states that she had surgery in [place] on 522. She is been doing Achilles stretching and physical therapy one visit has been in a fracture boot.  Assessment & Plan: Visit Diagnoses:  1. Achilles tendon contracture, right     Plan: Patient given instructions to demonstrate heel cord stretching. Follow-up in 2 weeks.  Follow-Up Instructions: Return in about 2 weeks (around 10/10/2016).   Ortho Exam  Patient is alert, oriented, no adenopathy, well-dressed, normal affect, normal respiratory effort. Examination patient's Achilles tendon is intact she still has contractures patient can obtain dorsiflexion to neutral. Her foot is neurovascularly intact she has good pulses there is no palpable defect of the Achilles. There are no nodular changes.  Imaging: No results found.  Labs: Lab Results  Component Value Date   HGBA1C 5.1 09/25/2011   HGBA1C 5.5 03/06/2011   HGBA1C  07/31/2010    5.1 (NOTE)                                                                       According to the ADA Clinical Practice Recommendations for 2011, when HbA1c is used as a screening test:   >=6.5%   Diagnostic of Diabetes Mellitus           (if abnormal result  is confirmed)  5.7-6.4%   Increased risk of developing Diabetes Mellitus  References:Diagnosis and Classification of Diabetes Mellitus,Diabetes DTOI,7124,58(KDXIP 1):S62-S69 and Standards of Medical Care in         Diabetes - 2011,Diabetes Care,2011,34   (Suppl 1):S11-S61.   REPTSTATUS 03/17/2016 FINAL 03/16/2016   GRAMSTAIN  08/25/2012    RARE WBC PRESENT, PREDOMINANTLY PMN RARE SQUAMOUS EPITHELIAL CELLS PRESENT RARE GRAM POSITIVE COCCI IN PAIRS   CULT <10,000 COLONIES/mL INSIGNIFICANT GROWTH (A) 03/16/2016   LABORGA NO GROWTH 09/14/2013    Orders:  No orders of the defined types were placed in this encounter.  No orders of the defined types were placed in this encounter.    Procedures: No procedures performed  Clinical Data: No additional findings.  ROS:  All other systems negative, except as noted in the HPI. Review of Systems  Objective: Vital Signs: Ht 5\' 6"  (1.676 m)   Wt 210 lb (95.3 kg)   BMI 33.89 kg/m   Specialty Comments:  No specialty comments available.  PMFS History: Patient Active Problem List   Diagnosis Date Noted  . Achilles tendon contracture, right 04/18/2016  . Acute respiratory failure with hypoxia ([place]) 03/16/2016  . Right flank pain 03/16/2016  . Influenza with  pneumonia 03/16/2016  . Acute pyelonephritis   . Influenza with respiratory manifestation   . OAB (overactive bladder) 02/28/2016  . Urgency incontinence 02/28/2016  . Situational anxiety 06/24/2015  . Cellulitis of scalp 04/27/2015  . Status post nail surgery 04/17/2015  . Mass of arm 04/09/2015  . Viral bronchitis 04/09/2015  . Deviated septum 11/17/2014  . Paronychia 09/02/2014  . Gastroesophageal reflux disease without esophagitis 09/02/2014  . Chronic pain syndrome 05/24/2014  . Bereavement 03/13/2014  . Bipolar affective disorder, depressed, severe ([place]) 03/11/2014  . Opiate dependence ([place]) 03/11/2014  . Intentional opiate overdose ([place]) 03/11/2014  . De Quervain's tenosynovitis, left 12/12/2013  . Right-sided low back pain without sciatica 09/14/2013  . Immunity status testing 08/10/2013  . Nausea with vomiting 07/28/2013  . Thrombocytopenia, unspecified ([place]) 07/28/2013  . Community acquired pneumonia  06/30/2013  . Hypoxia 06/30/2013  . External hemorrhoids 02/16/2013  . Cervical lymphadenopathy 10/12/2012  . Tension headache 10/12/2012  . Ingrown left greater toenail 08/25/2012  . Infection of left foot 08/25/2012  . Schizoaffective disorder, bipolar type ([place]) 03/31/2011  . Bipolar 1 disorder ([place])    Past Medical History:  Diagnosis Date  . Anxiety   . Bipolar affective disorder, mixed, in partial or remission   . Depression   . Dysrhythmia    Left sided heart diease  . Headache(784.0)   . History of gallstones   . Kidney stones   . Mental disorder   . Migraine   . Pneumonia    Hx: of as a child  . PONV (postoperative nausea and vomiting)   . Renal disorder   . Sleep apnea    sleep test 2014, did not have osa  . Substance abuse    Fentanyl, Percocet. last use 04/2011    Family History  Problem Relation Age of Onset  . Colon cancer Father 72  . Breast cancer Father   . Breast cancer Mother   . Uterine cancer Mother   . Stomach cancer Neg Hx   . Rectal cancer Neg Hx   . Esophageal cancer Neg Hx     Past Surgical History:  Procedure Laterality Date  . ABDOMINAL HYSTERECTOMY    . AMPUTATION Right 09/15/2012   Procedure: AMPUTATION DIGIT- right ;  Surgeon: Newt Minion, MD;  Location: [facility];  Service: Orthopedics;  Laterality: Right;  Right Great Toe Amputation at MTP (metatarsophalangeal)  . BUNIONECTOMY     11/14/2011  . CHOLECYSTECTOMY    . COLONOSCOPY     2001, 2008.  Father has colon cancer  . FOOT SURGERY     right x 2  . NASAL SEPTOPLASTY W/ TURBINOPLASTY Bilateral 12/04/2014   Procedure: NASAL SEPTOPLASTY WITH BILATERAL TURBINATE REDUCTION;  Surgeon: Izora Gala, MD;  Location: [facility];  Service: ENT;  Laterality: Bilateral;  . TOTAL ABDOMINAL HYSTERECTOMY W/ BILATERAL SALPINGOOPHORECTOMY    . VAGINAL HYSTERECTOMY     Social History   Occupational History  . nursing student    Social History Main Topics  . Smoking status: Never  Smoker  . Smokeless tobacco: Never Used  . Alcohol use No  . Drug use: No  . Sexual activity: Not on file

## 2016-10-10 ENCOUNTER — Ambulatory Visit (INDEPENDENT_AMBULATORY_CARE_PROVIDER_SITE_OTHER): Payer: Medicare Other | Admitting: Orthopedic Surgery

## 2016-10-13 ENCOUNTER — Encounter (INDEPENDENT_AMBULATORY_CARE_PROVIDER_SITE_OTHER): Payer: Self-pay | Admitting: Orthopedic Surgery

## 2016-10-13 ENCOUNTER — Ambulatory Visit (INDEPENDENT_AMBULATORY_CARE_PROVIDER_SITE_OTHER): Payer: Medicare Other | Admitting: Orthopedic Surgery

## 2016-10-13 VITALS — Ht 66.0 in | Wt 210.0 lb

## 2016-10-13 DIAGNOSIS — M6701 Short Achilles tendon (acquired), right ankle: Secondary | ICD-10-CM

## 2016-10-13 NOTE — Progress Notes (Signed)
Office Visit Note   Patient: [name]           Date of Birth: 12-31-1965           MRN: [id number] Visit Date: 10/13/2016              Requested by: Brunetta Jeans, PA-C [address] [place], [place] [address] PCP: Brunetta Jeans, PA-C  Chief Complaint  Patient presents with  . Right Foot - Follow-up    S/p achilles reconstruction in [place] 08/05/16      HPI: Patient is a 51 year old woman who is status post Achilles tendon rupture that was repaired and [place] on 08/05/2016 approximate 9 weeks ago. Patient states she discontinue fracture boot about 2 weeks ago and has been having some pain with weightbearing. Patient states she also fell and has some global pain around the right foot and ankle.  Assessment & Plan: Visit Diagnoses:  1. Achilles tendon contracture, right     Plan: Recommend continue heel cord stretching she was given a 9/16 inch heel lift to wear in the right shoe to unload the Achilles.  Follow-Up Instructions: Return in about 3 weeks (around 11/03/2016).   Ortho Exam  Patient is alert, oriented, no adenopathy, well-dressed, normal affect, normal respiratory effort. Patient has an antalgic gait. Examination she does have a Achilles contracture with dorsiflexion just short of neutral.  Posterior tibial tendon and peroneal tendons and Achilles tendons are nontender to palpation. There is no palpable defect of the Achilles. Compression the calf reproduces plantar flexion of the foot. There is no redness no cellulitis no signs of infection no dystrophic changes no evidence of complex regional pain syndrome.  Imaging: No results found.  Labs: Lab Results  Component Value Date   HGBA1C 5.1 09/25/2011   HGBA1C 5.5 03/06/2011   HGBA1C  07/31/2010    5.1 (NOTE)                                                                       According to the ADA Clinical Practice Recommendations for 2011, when HbA1c is used as a screening test:   >=6.5%    Diagnostic of Diabetes Mellitus           (if abnormal result  is confirmed)  5.7-6.4%   Increased risk of developing Diabetes Mellitus  References:Diagnosis and Classification of Diabetes Mellitus,Diabetes NUUV,2536,64(QIHKV 1):S62-S69 and Standards of Medical Care in         Diabetes - 2011,Diabetes Care,2011,34  (Suppl 1):S11-S61.   REPTSTATUS 03/17/2016 FINAL 03/16/2016   GRAMSTAIN  08/25/2012    RARE WBC PRESENT, PREDOMINANTLY PMN RARE SQUAMOUS EPITHELIAL CELLS PRESENT RARE GRAM POSITIVE COCCI IN PAIRS   CULT <10,000 COLONIES/mL INSIGNIFICANT GROWTH (A) 03/16/2016   LABORGA NO GROWTH 09/14/2013    Orders:  No orders of the defined types were placed in this encounter.  No orders of the defined types were placed in this encounter.    Procedures: No procedures performed  Clinical Data: No additional findings.  ROS:  All other systems negative, except as noted in the HPI. Review of Systems  Objective: Vital Signs: Ht 5\' 6"  (1.676 m)   Wt 210 lb (95.3 kg)   BMI  33.89 kg/m   Specialty Comments:  No specialty comments available.  PMFS History: Patient Active Problem List   Diagnosis Date Noted  . Achilles tendon contracture, right 04/18/2016  . Acute respiratory failure with hypoxia ([place]) 03/16/2016  . Right flank pain 03/16/2016  . Influenza with pneumonia 03/16/2016  . Acute pyelonephritis   . Influenza with respiratory manifestation   . OAB (overactive bladder) 02/28/2016  . Urgency incontinence 02/28/2016  . Situational anxiety 06/24/2015  . Cellulitis of scalp 04/27/2015  . Status post nail surgery 04/17/2015  . Mass of arm 04/09/2015  . Viral bronchitis 04/09/2015  . Deviated septum 11/17/2014  . Paronychia 09/02/2014  . Gastroesophageal reflux disease without esophagitis 09/02/2014  . Chronic pain syndrome 05/24/2014  . Bereavement 03/13/2014  . Bipolar affective disorder, depressed, severe ([place]) 03/11/2014  . Opiate dependence ([place]) 03/11/2014  .  Intentional opiate overdose ([place]) 03/11/2014  . De Quervain's tenosynovitis, left 12/12/2013  . Right-sided low back pain without sciatica 09/14/2013  . Immunity status testing 08/10/2013  . Nausea with vomiting 07/28/2013  . Thrombocytopenia, unspecified ([place]) 07/28/2013  . Community acquired pneumonia 06/30/2013  . Hypoxia 06/30/2013  . External hemorrhoids 02/16/2013  . Cervical lymphadenopathy 10/12/2012  . Tension headache 10/12/2012  . Ingrown left greater toenail 08/25/2012  . Infection of left foot 08/25/2012  . Schizoaffective disorder, bipolar type ([place]) 03/31/2011  . Bipolar 1 disorder ([place])    Past Medical History:  Diagnosis Date  . Anxiety   . Bipolar affective disorder, mixed, in partial or remission   . Depression   . Dysrhythmia    Left sided heart diease  . Headache(784.0)   . History of gallstones   . Kidney stones   . Mental disorder   . Migraine   . Pneumonia    Hx: of as a child  . PONV (postoperative nausea and vomiting)   . Renal disorder   . Sleep apnea    sleep test 2014, did not have osa  . Substance abuse    Fentanyl, Percocet. last use 04/2011    Family History  Problem Relation Age of Onset  . Colon cancer Father 54  . Breast cancer Father   . Breast cancer Mother   . Uterine cancer Mother   . Stomach cancer Neg Hx   . Rectal cancer Neg Hx   . Esophageal cancer Neg Hx     Past Surgical History:  Procedure Laterality Date  . ABDOMINAL HYSTERECTOMY    . 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 BUNIONECTOMY     11/14/2011  . CHOLECYSTECTOMY    . COLONOSCOPY     2001, 2008.  Father has colon cancer  . FOOT SURGERY     right x 2  . NASAL SEPTOPLASTY W/ TURBINOPLASTY Bilateral 12/04/2014   Procedure: NASAL SEPTOPLASTY WITH BILATERAL TURBINATE REDUCTION;  Surgeon:  Izora Gala, MD;  Location: [facility];  Service: ENT;  Laterality: Bilateral;  . TOTAL ABDOMINAL HYSTERECTOMY W/ BILATERAL SALPINGOOPHORECTOMY    . VAGINAL HYSTERECTOMY     Social History   Occupational History  . nursing student    Social History Main Topics  . Smoking status: Never Smoker  . Smokeless tobacco: Never Used  . Alcohol use No  . Drug use: No  . Sexual activity: Not on file

## 2016-10-14 ENCOUNTER — Ambulatory Visit (INDEPENDENT_AMBULATORY_CARE_PROVIDER_SITE_OTHER): Payer: Medicare Other | Admitting: Physician Assistant

## 2016-10-14 ENCOUNTER — Encounter: Payer: Self-pay | Admitting: Physician Assistant

## 2016-10-14 ENCOUNTER — Ambulatory Visit (INDEPENDENT_AMBULATORY_CARE_PROVIDER_SITE_OTHER): Payer: Medicare Other | Admitting: Orthopedic Surgery

## 2016-10-14 VITALS — BP 110/78 | HR 92 | Temp 97.4°F | Resp 14 | Ht 66.0 in | Wt 195.0 lb

## 2016-10-14 DIAGNOSIS — G894 Chronic pain syndrome: Secondary | ICD-10-CM

## 2016-10-14 DIAGNOSIS — M6701 Short Achilles tendon (acquired), right ankle: Secondary | ICD-10-CM | POA: Diagnosis not present

## 2016-10-14 NOTE — Progress Notes (Signed)
Patient presents to clinic today requesting referral to a new orthopedist for second opinion regarding achilles tendon contracture and a referral to pain specialist for chronic pain management. Patient underwent surgical procedure in may, after which time she has dealt with daily pain. Is currently followed by Dr. Sharol Given but would like to see another specialist to see if she can get a second opinion on next steps.   Past Medical History:  Diagnosis Date  . Anxiety   . Bipolar affective disorder, mixed, in partial or remission   . Depression   . Dysrhythmia    Left sided heart diease  . Headache(784.0)   . History of gallstones   . Kidney stones   . Mental disorder   . Migraine   . Pneumonia    Hx: of as a child  . PONV (postoperative nausea and vomiting)   . Renal disorder   . Sleep apnea    sleep test 2014, did not have osa  . Substance abuse    Fentanyl, Percocet. last use 04/2011    Current Outpatient Prescriptions on File Prior to Visit  Medication Sig Dispense Refill  . albuterol (PROVENTIL HFA;VENTOLIN HFA) 108 (90 Base) MCG/ACT inhaler Inhale 1-2 puffs into the lungs every 6 (six) hours as needed for wheezing or shortness of breath. 1 Inhaler 0  . lithium carbonate 300 MG capsule Take 600 mg by mouth at bedtime.     Marland Kitchen QUEtiapine (SEROQUEL) 400 MG tablet Take 2 tablets (800 mg total) by mouth at bedtime. For mood control 60 tablet 0  . traZODone (DESYREL) 50 MG tablet Take 100 mg by mouth daily.     Marland Kitchen zolpidem (AMBIEN) 10 MG tablet Take 10 mg by mouth at bedtime.     No current facility-administered medications on file prior to visit.     No Known Allergies  Family History  Problem Relation Age of Onset  . Colon cancer Father 45  . Breast cancer Father   . Breast cancer Mother   . Uterine cancer Mother   . Stomach cancer Neg Hx   . Rectal cancer Neg Hx   . Esophageal cancer Neg Hx     Social History   Social History  . Marital status: Married    Spouse name:  N/A  . Number of children: 6  . Years of education: N/A   Occupational History  . nursing student    Social History Main Topics  . Smoking status: Never Smoker  . Smokeless tobacco: Never Used  . Alcohol use No  . Drug use: No  . Sexual activity: Not Asked   Other Topics Concern  . 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  All other ROS are negative.  BP 110/78   Pulse 92   Temp (!) 97.4 F (36.3 C) (Oral)   Resp 14   Ht 5\' 6"  (1.676 m)   Wt 195 lb (88.5 kg)   SpO2 98%   BMI 31.47 kg/m   Physical Exam  Constitutional: She is oriented to person, place, and time and well-developed, well-nourished, and  in no distress.  HENT:  Head: Normocephalic and atraumatic.  Eyes: Conjunctivae are normal.  Cardiovascular: Normal rate, regular rhythm, normal heart sounds and intact distal pulses.   Pulmonary/Chest: Effort normal and breath sounds normal. No respiratory distress. She has no wheezes. She has no rales. She exhibits no tenderness.  Neurological: She is alert and oriented to person, place, and time.  Skin: Skin is warm and dry. No rash noted.  Psychiatric: Affect normal.  Vitals reviewed.  Assessment/Plan: 1. Achilles tendon contracture, right Referral placed to Orthopedics for second opinion. - Ambulatory referral to Orthopedic Surgery  2. Chronic pain syndrome Patient unable to receive any controlled medications for this practice. She is high risk due to history of overdose and current psychiatric medications. Discussed OTC regimen to start. Referrals placed to specialty.  - Ambulatory referral to Orthopedic Surgery - Ambulatory referral to Pain Clinic   Leeanne Rio, [place]

## 2016-10-14 NOTE — Progress Notes (Signed)
Pre visit review using our clinic review tool, if applicable. No additional management support is needed unless otherwise documented below in the visit note. 

## 2016-10-14 NOTE — Patient Instructions (Signed)
Please keep your phone on. You will be contacted by Orthopedics for second opinion and to discuss further treatment options. You will also be contacted by a Pain Clinic for further pain management. Our office will not be able to provider opioid pain medication due to history.   Please take Tylenol arthritis OTC each morning and at bedtime. You can take Ibuprofen mid-day for breakthrough pain. Also get some Aspercreme with lidocaine and apply to the ankle and heel. This is all I can recommend presently. I will get you in with a specialist as soon as possible.

## 2016-11-14 ENCOUNTER — Ambulatory Visit (INDEPENDENT_AMBULATORY_CARE_PROVIDER_SITE_OTHER): Payer: Medicare Other | Admitting: Orthopedic Surgery

## 2016-11-19 ENCOUNTER — Encounter (INDEPENDENT_AMBULATORY_CARE_PROVIDER_SITE_OTHER): Payer: Self-pay | Admitting: Orthopedic Surgery

## 2016-11-19 ENCOUNTER — Ambulatory Visit (INDEPENDENT_AMBULATORY_CARE_PROVIDER_SITE_OTHER): Payer: Medicare Other | Admitting: Orthopedic Surgery

## 2016-11-19 DIAGNOSIS — M6701 Short Achilles tendon (acquired), right ankle: Secondary | ICD-10-CM

## 2016-11-19 NOTE — Progress Notes (Signed)
Office Visit Note   Patient: [name]           Date of Birth: 10-Aug-1965           MRN: [id number] Visit Date: 11/19/2016              Requested by: Brunetta Jeans, PA-C [address] [place], [place] [address] PCP: Brunetta Jeans, PA-C  Chief Complaint  Patient presents with  . Right Ankle - Follow-up      HPI: Patient presents status post Achilles tendon reconstruction. 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 Patient states the pain is improving.  Assessment & Plan: Visit Diagnoses:  1. Achilles tendon contracture, right     Plan: Recommended a stiff soled walking sneaker or trail running sneaker with sole orthotics continue with heel cord stretching wean off the heel lift as tolerated.  Follow-Up Instructions: Return if symptoms worsen or fail to improve.   Ortho Exam  Patient is alert, oriented, no adenopathy, well-dressed, normal affect, normal respiratory effort. Examination patient has dorsiflexion to neutral there is no forefoot ulcers or calluses there is no redness no cellulitis she does have some thickness in the Achilles that there is no signs of infection or drainage no cellulitis no tenderness to palpation along the Achilles.  Imaging: No results found. No images are attached to the encounter.  Labs: Lab Results  Component Value Date   HGBA1C 5.1 09/25/2011   HGBA1C 5.5 03/06/2011   HGBA1C  07/31/2010    5.1 (NOTE)                                                                       According to the ADA Clinical Practice Recommendations for 2011, when HbA1c is used as a screening test:   >=6.5%   Diagnostic of Diabetes Mellitus           (if abnormal result  is confirmed)  5.7-6.4%   Increased risk of developing Diabetes Mellitus  References:Diagnosis and Classification of Diabetes Mellitus,Diabetes QMVH,8469,62(XBMWU 1):S62-S69 and Standards of  Medical Care in         Diabetes - 2011,Diabetes Care,2011,34  (Suppl 1):S11-S61.   REPTSTATUS 03/17/2016 FINAL 03/16/2016   GRAMSTAIN  08/25/2012    RARE WBC PRESENT, PREDOMINANTLY PMN RARE SQUAMOUS EPITHELIAL CELLS PRESENT RARE GRAM POSITIVE COCCI IN PAIRS   CULT <10,000 COLONIES/mL INSIGNIFICANT GROWTH (A) 03/16/2016   LABORGA NO GROWTH 09/14/2013    Orders:  No orders of the defined types were placed in this encounter.  No orders of the defined types were placed in this encounter.    Procedures: No procedures performed  Clinical Data: No additional findings.  ROS:  All other systems negative, except as noted in the HPI. Review of Systems  Objective: Vital Signs: There were no vitals taken for this visit.  Specialty Comments:  No specialty comments available.  PMFS History: Patient Active Problem List   Diagnosis Date Noted  . Achilles tendon contracture, right 04/18/2016  . OAB (overactive bladder) 02/28/2016  . Urgency incontinence 02/28/2016  . Situational anxiety 06/24/2015  . Mass of  arm 04/09/2015  . Deviated septum 11/17/2014  . Gastroesophageal reflux disease without esophagitis 09/02/2014  . Chronic pain syndrome 05/24/2014  . Bereavement 03/13/2014  . Bipolar affective disorder, depressed, severe ([place]) 03/11/2014  . Opiate dependence ([place]) 03/11/2014  . Intentional opiate overdose ([place]) 03/11/2014  . De Quervain's tenosynovitis, left 12/12/2013  . Nausea with vomiting 07/28/2013  . Thrombocytopenia, unspecified ([place]) 07/28/2013  . External hemorrhoids 02/16/2013  . Schizoaffective disorder, bipolar type ([place]) 03/31/2011   Past Medical History:  Diagnosis Date  . Anxiety   . Bipolar affective disorder, mixed, in partial or remission   . Depression   . Dysrhythmia    Left sided heart diease  . Headache(784.0)   . History of gallstones   . Kidney stones   . Mental disorder   . Migraine   . Pneumonia    Hx: of as a child  . PONV  (postoperative nausea and vomiting)   . Renal disorder   . Sleep apnea    sleep test 2014, did not have osa  . Substance abuse    Fentanyl, Percocet. last use 04/2011    Family History  Problem Relation Age of Onset  . Colon cancer Father 53  . Breast cancer Father   . Breast cancer Mother   . Uterine cancer Mother   . Stomach cancer Neg Hx   . Rectal cancer Neg Hx   . Esophageal cancer Neg Hx     Past Surgical History:  Procedure Laterality Date  . ABDOMINAL HYSTERECTOMY    . AMPUTATION Right 09/15/2012   Procedure: AMPUTATION DIGIT- right ;  Surgeon: Newt Minion, MD;  Location: 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 BUNIONECTOMY     11/14/2011  . CHOLECYSTECTOMY    . COLONOSCOPY     2001, 2008.  Father has colon cancer  . FOOT SURGERY     right x 2  . NASAL SEPTOPLASTY W/ TURBINOPLASTY Bilateral 12/04/2014   Procedure: NASAL SEPTOPLASTY WITH BILATERAL TURBINATE REDUCTION;  Surgeon: Izora Gala, MD;  Location: [facility];  Service: ENT;  Laterality: Bilateral;  . TOTAL ABDOMINAL HYSTERECTOMY W/ BILATERAL SALPINGOOPHORECTOMY    . VAGINAL HYSTERECTOMY     Social History   Occupational History  . nursing student    Social History Main Topics  . Smoking status: Never Smoker  . Smokeless tobacco: Never Used  . Alcohol use No  . Drug use: No  . Sexual activity: Not on file

## 2016-12-01 ENCOUNTER — Other Ambulatory Visit: Payer: Self-pay | Admitting: Physician Assistant

## 2016-12-23 ENCOUNTER — Emergency Department (HOSPITAL_BASED_OUTPATIENT_CLINIC_OR_DEPARTMENT_OTHER): Payer: Medicare Other

## 2016-12-23 ENCOUNTER — Encounter (HOSPITAL_BASED_OUTPATIENT_CLINIC_OR_DEPARTMENT_OTHER): Payer: Self-pay | Admitting: Emergency Medicine

## 2016-12-23 ENCOUNTER — Emergency Department (HOSPITAL_BASED_OUTPATIENT_CLINIC_OR_DEPARTMENT_OTHER)
Admission: EM | Admit: 2016-12-23 | Discharge: 2016-12-23 | Disposition: A | Discharge status: Home / Self Care | Payer: Medicare Other | Attending: Emergency Medicine | Admitting: Emergency Medicine

## 2016-12-23 DIAGNOSIS — M5431 Sciatica, right side: Secondary | ICD-10-CM | POA: Diagnosis not present

## 2016-12-23 DIAGNOSIS — Z79899 Other long term (current) drug therapy: Secondary | ICD-10-CM | POA: Insufficient documentation

## 2016-12-23 DIAGNOSIS — M545 Low back pain: Secondary | ICD-10-CM | POA: Insufficient documentation

## 2016-12-23 DIAGNOSIS — M79605 Pain in left leg: Secondary | ICD-10-CM | POA: Insufficient documentation

## 2016-12-23 DIAGNOSIS — R059 Cough, unspecified: Secondary | ICD-10-CM

## 2016-12-23 DIAGNOSIS — M5432 Sciatica, left side: Secondary | ICD-10-CM

## 2016-12-23 DIAGNOSIS — R05 Cough: Secondary | ICD-10-CM | POA: Insufficient documentation

## 2016-12-23 DIAGNOSIS — F250 Schizoaffective disorder, bipolar type: Secondary | ICD-10-CM | POA: Insufficient documentation

## 2016-12-23 LAB — CBC WITH DIFFERENTIAL/PLATELET
Basophils Absolute: 0 10*3/uL (ref 0.0–0.1)
Basophils Relative: 0 %
Eosinophils Absolute: 0 10*3/uL (ref 0.0–0.7)
Eosinophils Relative: 0 %
HCT: 36.1 % (ref 36.0–46.0)
Hemoglobin: 11.9 g/dL — ABNORMAL LOW (ref 12.0–15.0)
Lymphocytes Relative: 17 %
Lymphs Abs: 0.8 10*3/uL (ref 0.7–4.0)
MCH: 30.1 pg (ref 26.0–34.0)
MCHC: 33 g/dL (ref 30.0–36.0)
MCV: 91.2 fL (ref 78.0–100.0)
Monocytes Absolute: 0.3 10*3/uL (ref 0.1–1.0)
Monocytes Relative: 7 %
Neutro Abs: 3.5 10*3/uL (ref 1.7–7.7)
Neutrophils Relative %: 76 %
Platelets: 134 10*3/uL — ABNORMAL LOW (ref 150–400)
RBC: 3.96 MIL/uL (ref 3.87–5.11)
RDW: 13 % (ref 11.5–15.5)
WBC: 4.6 10*3/uL (ref 4.0–10.5)

## 2016-12-23 LAB — BASIC METABOLIC PANEL
Anion gap: 6 (ref 5–15)
BUN: 9 mg/dL (ref 6–20)
CO2: 28 mmol/L (ref 22–32)
Calcium: 9.1 mg/dL (ref 8.9–10.3)
Chloride: 104 mmol/L (ref 101–111)
Creatinine, Ser: 0.99 mg/dL (ref 0.44–1.00)
GFR calc Af Amer: >60 mL/min (ref >60)
GFR calc non Af Amer: >60 mL/min (ref >60)
Glucose, Bld: 139 mg/dL — ABNORMAL HIGH (ref 65–99)
Potassium: 3.8 mmol/L (ref 3.5–5.1)
Sodium: 138 mmol/L (ref 135–145)

## 2016-12-23 LAB — TROPONIN I: Troponin I: <0.03 ng/mL (ref <0.03)

## 2016-12-23 MED ORDER — PROMETHAZINE HCL 12.5 MG PO TABS: 12.5000 mg | ORAL_TABLET | Freq: Three times a day (TID) | ORAL | 0 refills | Status: DC | PRN

## 2016-12-23 MED ORDER — CEPHALEXIN 500 MG PO CAPS: 500.0000 mg | ORAL_CAPSULE | Freq: Four times a day (QID) | ORAL | 0 refills | Status: AC

## 2016-12-23 MED ORDER — CYCLOBENZAPRINE HCL 10 MG PO TABS
10.0000 mg | ORAL_TABLET | Freq: Once | ORAL | Status: AC
  Administered 2016-12-23: 10 mg via ORAL
  Filled 2016-12-23: qty 1

## 2016-12-23 MED ORDER — IBUPROFEN 800 MG PO TABS
800.0000 mg | ORAL_TABLET | Freq: Once | ORAL | Status: AC
  Administered 2016-12-23: 800 mg via ORAL
  Filled 2016-12-23: qty 1

## 2016-12-23 MED ORDER — SODIUM CHLORIDE 0.9 % IV BOLUS (SEPSIS)
1000.0000 mL | Freq: Once | INTRAVENOUS | Status: AC
  Administered 2016-12-23: 1000 mL via INTRAVENOUS

## 2016-12-23 MED ORDER — CYCLOBENZAPRINE HCL 10 MG PO TABS: 10.0000 mg | ORAL_TABLET | Freq: Three times a day (TID) | ORAL | 0 refills | Status: DC | PRN

## 2016-12-23 MED ORDER — PROMETHAZINE HCL 25 MG/ML IJ SOLN
25.0000 mg | Freq: Once | INTRAMUSCULAR | Status: AC
  Administered 2016-12-23: 25 mg via INTRAVENOUS
  Filled 2016-12-23: qty 1

## 2016-12-23 NOTE — Discharge Instructions (Signed)
Please take all of your antibiotics until finished!   You may develop abdominal discomfort or diarrhea from the antibiotic.  You may help offset this with probiotics which you can buy or get in yogurt. Do not eat  or take the probiotics until 2 hours after your antibiotic.   Please stop taking Augmentin and start taking Keflex for your UTI. Alternate 600 mg of ibuprofen and [phone] mg of Tylenol every 3 hours as needed for pain. Do not exceed 4000 mg of Tylenol daily. You may take Flexeril up to twice daily as needed for muscle spasms. This medication may make you drowsy, so I typically only recommended at night. If this medication makes you drowsy throughout the day, no driving, drinking alcohol, or operating heavy machinery. You may also cut these tablets in half. Ice or heat to areas of soreness. Do some gentle stretching throughout the day, especially during hot showers or baths. Take short frequent walks and avoid prolonged periods of sitting or laying. Follow up with primary care physician for recheck of ongoing symptoms and possible referral to physical therapy but return to ER for emergent changing or worsening of symptoms such as severe headache that gets worse, altered mental status/behaving unusually, persistent vomiting, excessive drowsiness, numbness to the arms or legs, unsteady gait, or slurred speech.

## 2016-12-23 NOTE — ED Notes (Signed)
Pt. Main complaint is back pain and cough.

## 2016-12-23 NOTE — ED Triage Notes (Signed)
Patient states that she was at the MD 3 days ago and was dx with a bladder infection and taking the rx for this. Patient states that she started to have the cough and pain to her right thigh like "sciatica" pain x 2 days.

## 2016-12-23 NOTE — ED Provider Notes (Signed)
[facility] Provider Note   CSN: [id number] Arrival date & time: 12/23/16  1617     History   Chief Complaint Chief Complaint  Patient presents with  . Cough    HPI [name] is a 51 y.o. female with past medical history including apple or affect disorder, anxiety, depression, migraines, GERD, chronic pain syndrome, and substance abuse who presents today with chief complaint acute onset, personally worsening cough and left leg pain for 3 days. She was seen and evaluated by urgent care 4 days ago and was diagnosed with an acute UTI and was discharged with Augmentin BID for 7 days. States her urinary symptoms have not significantly improved but 3 days ago she developed a nonproductive cough. She endorses sharp chest pain when she coughs only, as well as DOE. Denies fevers, chills, abdominal pain. She does endorse mild nausea. She states that 3 days ago she also developed pain radiating down the posterior aspect of her left leg and right inner thigh extending to the knee only. She states this feels like her usual sciatica. She endorses mild low back pain and right-sided. Pain worsens with bending and sitting for an extended period of time, improves with walking and movement. She has tried tramadol without significant relief of her symptoms. Denies numbness, tingling, weakness, bowel or bladder incontinence, saddle anesthesia, IV drug use, or history of cancer. States she has not been drinking as much water as she should lately.  The history is provided by the patient.    Past Medical History:  Diagnosis Date  . Anxiety   . Bipolar affective disorder, mixed, in partial or remission   . Depression   . Dysrhythmia    Left sided heart diease  . Headache(784.0)   . History of gallstones   . Kidney stones   . Mental disorder   . Migraine   . Pneumonia    Hx: of as a child  . PONV (postoperative nausea and vomiting)   . Renal disorder   . Sleep apnea    sleep test 2014,  did not have osa  . Substance abuse (HCC)    Fentanyl, Percocet. last use 04/2011    Patient Active Problem List   Diagnosis Date Noted  . Achilles tendon contracture, right 04/18/2016  . OAB (overactive bladder) 02/28/2016  . Urgency incontinence 02/28/2016  . Situational anxiety 06/24/2015  . Mass of arm 04/09/2015  . Deviated septum 11/17/2014  . Gastroesophageal reflux disease without esophagitis 09/02/2014  . Chronic pain syndrome 05/24/2014  . Bereavement 03/13/2014  . Bipolar affective disorder, depressed, severe ([place]) 03/11/2014  . Opiate dependence ([place]) 03/11/2014  . Intentional opiate overdose ([place]) 03/11/2014  . De Quervain's tenosynovitis, left 12/12/2013  . Nausea with vomiting 07/28/2013  . Thrombocytopenia, unspecified ([place]) 07/28/2013  . External hemorrhoids 02/16/2013  . Schizoaffective disorder, bipolar type ([place]) 03/31/2011    Past Surgical History:  Procedure Laterality Date  . ABDOMINAL HYSTERECTOMY    . 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 BUNIONECTOMY     11/14/2011  . CHOLECYSTECTOMY    . COLONOSCOPY     2001, 2008.  Father has colon cancer  . FOOT SURGERY     right x 2  . NASAL SEPTOPLASTY W/ TURBINOPLASTY Bilateral 12/04/2014   Procedure: NASAL SEPTOPLASTY WITH BILATERAL TURBINATE REDUCTION;  Surgeon:  Izora Gala, MD;  Location: [facility];  Service: ENT;  Laterality: Bilateral;  . TOTAL ABDOMINAL HYSTERECTOMY W/ BILATERAL SALPINGOOPHORECTOMY    . VAGINAL HYSTERECTOMY      OB History    No data available       Home Medications    Prior to Admission medications   Medication Sig Start Date End Date Taking? Authorizing Provider  albuterol (PROVENTIL HFA;VENTOLIN HFA) 108 (90 Base) MCG/ACT inhaler Inhale 1-2 puffs into the lungs every 6 (six) hours as  needed for wheezing or shortness of breath. 03/14/16   Barnet Glasgow, NP  cephALEXin (KEFLEX) 500 MG capsule Take 1 capsule (500 mg total) by mouth 4 (four) times daily. 12/23/16 12/30/16  Rodell Perna A, PA-C  cyclobenzaprine (FLEXERIL) 10 MG tablet Take 1 tablet (10 mg total) by mouth 3 (three) times daily as needed for muscle spasms. 12/23/16   Keilen Kahl A, PA-C  lithium carbonate 300 MG capsule Take 600 mg by mouth at bedtime.  05/12/14   [provider]  promethazine (PHENERGAN) 12.5 MG tablet Take 1 tablet (12.5 mg total) by mouth every 8 (eight) hours as needed for nausea or vomiting. 12/23/16   Vashaun Osmon A, PA-C  QUEtiapine (SEROQUEL) 400 MG tablet Take 2 tablets (800 mg total) by mouth at bedtime. For mood control 03/15/14   Lindell Spar I, NP  traZODone (DESYREL) 50 MG tablet Take 100 mg by mouth daily.  04/26/15   [provider]  zolpidem (AMBIEN) 10 MG tablet Take 10 mg by mouth at bedtime.    [provider]    Family History Family History  Problem Relation Age of Onset  . Colon cancer Father 79  . Breast cancer Father   . Breast cancer Mother   . Uterine cancer Mother   . Stomach cancer Neg Hx   . Rectal cancer Neg Hx   . Esophageal cancer Neg Hx     Social History Social History  Substance Use Topics  . Smoking status: Never Smoker  . Smokeless tobacco: Never Used  . Alcohol use No     Allergies   Patient has no known allergies.   Review of Systems Review of Systems  Constitutional: Negative for chills and fever.  HENT: Negative for congestion, ear pain and sore throat.   Respiratory: Positive for cough and shortness of breath.   Cardiovascular: Positive for chest pain. Negative for leg swelling.  Gastrointestinal: Positive for nausea. Negative for abdominal pain, constipation, diarrhea and vomiting.  Genitourinary: Positive for dysuria (ongoing, being treated with abx). Negative for hematuria.  Musculoskeletal: Positive for back  pain.  Neurological: Negative for syncope, weakness and numbness.     Physical Exam Updated Vital Signs BP 114/69   Pulse (!) 111   Temp 98.9 F (37.2 C) (Oral)   Resp 18   Ht 5\' 6"  (1.676 m)   Wt 90.7 kg (200 lb)   SpO2 98%   BMI 32.28 kg/m   Physical Exam  Constitutional: She appears well-developed and well-nourished. No distress.  HENT:  Head: Normocephalic and atraumatic.  Right Ear: External ear normal.  Left Ear: External ear normal.  Nose: Nose normal.  Mouth/Throat: Oropharynx is clear and moist.  No frontal or maxillary sinus TTP, nasal septum midline without nasal congestion, posterior oropharynx normal  Eyes: Pupils are equal, round, and reactive to light. Conjunctivae and EOM are normal. Right eye exhibits no discharge. Left eye exhibits no discharge.  Neck: Normal range of motion. Neck  supple. No JVD present. No tracheal deviation present.  Cardiovascular: Regular rhythm, normal heart sounds and intact distal pulses.   Tachycardic,  2+ radial and DP/PT pulses bl, negative Homan's bl   Pulmonary/Chest: Effort normal and breath sounds normal. No respiratory distress. She has no wheezes. She has no rales. She exhibits no tenderness.  Abdominal: Soft. Bowel sounds are normal. She exhibits no distension. There is no tenderness.  Musculoskeletal: Normal range of motion. She exhibits tenderness. She exhibits no edema.  Right great toe surgically amputated. Mild bilateral SI joint tenderness and right lateral hip TTP. No underlying deformity or crepitus. No midline spine TTP, mild bilateral paraspinal muscle tenderness at around the region of L5/S1. Bilateral straight leg raise present. 5/5 strength of BLE major muscle groups. Decreased range of motion of the lumbar spine with lateral rotation due to pain.  Neurological: She is alert. No sensory deficit. She exhibits normal muscle tone.  Fluent speech, no facial droop, sensation intact to soft touch of bilateral lower  extremities  Skin: Skin is warm and dry. No erythema.  Psychiatric: She has a normal mood and affect. Her behavior is normal.  Nursing note and vitals reviewed.    ED Treatments / Results  Labs (all labs ordered are listed, but only abnormal results are displayed) Labs Reviewed  CBC WITH DIFFERENTIAL/PLATELET - Abnormal; Notable for the following:       Result Value   Hemoglobin 11.9 (*)    Platelets 134 (*)    All other components within normal limits  BASIC METABOLIC PANEL - Abnormal; Notable for the following:    Glucose, Bld 139 (*)    All other components within normal limits  TROPONIN I    EKG  EKG Interpretation  Date/Time:  Tuesday December 23 2016 17:40:14 EDT Ventricular Rate:  99 PR Interval:    QRS Duration: 112 QT Interval:  382 QTC Calculation: 491 R Axis:   -24 Text Interpretation:  Sinus rhythm Incomplete right bundle branch block Borderline prolonged QT interval since previous tracing, previous T wave inversions in III have normalized Confirmed by Theotis Burrow [phone]) on 12/23/2016 6:33:26 PM       Radiology Dg Chest 2 View  Result Date: 12/23/2016 CLINICAL DATA:  Cough. EXAM: CHEST  2 VIEW COMPARISON:  Chest x-ray dated March 15, 2016. FINDINGS: The cardiomediastinal silhouette is normal in size. Normal pulmonary vascularity. No focal consolidation, pleural effusion, or pneumothorax. No acute osseous abnormality. IMPRESSION: No active cardiopulmonary disease. Electronically Signed   By: Titus Dubin M.D.   On: 12/23/2016 17:44    Procedures Procedures (including critical care time)  Medications Ordered in ED Medications  sodium chloride 0.9 % bolus 1,000 mL (0 mLs Intravenous Stopped 12/23/16 1854)  ibuprofen (ADVIL,MOTRIN) tablet 800 mg (800 mg Oral Given 12/23/16 1717)  cyclobenzaprine (FLEXERIL) tablet 10 mg (10 mg Oral Given 12/23/16 1717)  promethazine (PHENERGAN) injection 25 mg (25 mg Intravenous Given 12/23/16 1854)     Initial  Impression / Assessment and Plan / ED Course  I have reviewed the triage vital signs and the nursing notes.  Pertinent labs & imaging results that were available during my care of the patient were reviewed by me and considered in my medical decision making (see chart for details).     Patient with nonproductive cough and sciatica presents to the ED. Afebrile, initially tachycardic with improvement on reevaluation by me. She is neurovascularly intact. No red flags concerning patient's back pain. No s/s of central cord compression  or cauda equina. Lower extremities are neurovascularly intact and patient is ambulating without difficulty. Chest x-ray shows no active cardiopulmonary disease and no evidence of pneumonia or pleural effusion. No leukocytosis, no significant joint abnormalities. EKG shows no evidence of ST segment abnormality or arrhythmia. Troponin is negative. Low suspicion of ACS or MI. Low suspicion of DVT. On reevaluation, patient states she is feeling better after administration of fluids, Phenergan, Flexeril, and ibuprofen. She is currently being treated for UTI with Augmentin, will switch to Keflex as she has not had any improvement and her urinary symptoms. Stable for discharge home with follow-up with PCP, which she has scheduled for tomorrow. Discussed indications for return to the ED. Will discharge with Flexeril for her sciatica and Phenergan for her nausea. Pt verbalized understanding of and agreement with plan and is safe for discharge home at this time.   Final Clinical Impressions(s) / ED Diagnoses   Final diagnoses:  Cough  Bilateral sciatica    New Prescriptions Discharge Medication List as of 12/23/2016  6:44 PM    START taking these medications   Details  cephALEXin (KEFLEX) 500 MG capsule Take 1 capsule (500 mg total) by mouth 4 (four) times daily., Starting Tue 12/23/2016, Until Tue 12/30/2016, Print    cyclobenzaprine (FLEXERIL) 10 MG tablet Take 1 tablet (10 mg  total) by mouth 3 (three) times daily as needed for muscle spasms., Starting Tue 12/23/2016, Print    promethazine (PHENERGAN) 12.5 MG tablet Take 1 tablet (12.5 mg total) by mouth every 8 (eight) hours as needed for nausea or vomiting., Starting Tue 12/23/2016, Print         Gursimran Litaker, Todd [place] A, PA-C 12/24/16 0147    Little, Wenda Overland, MD 12/24/16 1322

## 2017-02-12 ENCOUNTER — Ambulatory Visit: Payer: Self-pay | Admitting: Physician Assistant

## 2017-03-03 ENCOUNTER — Ambulatory Visit (INDEPENDENT_AMBULATORY_CARE_PROVIDER_SITE_OTHER): Payer: Medicare Other

## 2017-03-03 ENCOUNTER — Encounter (INDEPENDENT_AMBULATORY_CARE_PROVIDER_SITE_OTHER): Payer: Self-pay | Admitting: Orthopedic Surgery

## 2017-03-03 ENCOUNTER — Ambulatory Visit (INDEPENDENT_AMBULATORY_CARE_PROVIDER_SITE_OTHER): Payer: Medicare Other | Admitting: Orthopedic Surgery

## 2017-03-03 DIAGNOSIS — M6701 Short Achilles tendon (acquired), right ankle: Secondary | ICD-10-CM

## 2017-03-03 DIAGNOSIS — M79671 Pain in right foot: Secondary | ICD-10-CM

## 2017-03-03 NOTE — Progress Notes (Signed)
Office Visit Note   Patient: [name]           Date of Birth: [birth year]           MRN: [id number] Visit Date: 03/03/2017              Requested by: Brunetta Jeans, PA-C [address] [place], [place] [address] PCP: Brunetta Jeans, PA-C  Chief Complaint  Patient presents with  . Right Foot - Pain      HPI: Patient is a 51 year old woman who presents for evaluation for right forefoot pain.  She is status post great toe amputation status post Achilles tendon tendon contracture with gastrocnemius recession and Achilles tendon reconstruction.  Patient states she has pain across the forefoot pain in her arch.  Patient states she has tried heel cord stretching and stiff soled shoes without relief.  She states the Achilles tendon does not hurt.  She has pain with driving complains of pain constantly also pain over the deep peroneal nerve.  Assessment & Plan: Visit Diagnoses:  1. Pain in right foot   2. Achilles tendon contracture, right     Plan: Patient is given a prescription for [employer] for custom orthotics to unload the metatarsal heads as well as an extra-depth shoe.  Patient may require neuropathy medications such as Neurontin at a low dose to help with her neuropathic pain.  Will reevaluate after she has had time to obtain the extra-depth shoes and custom orthotics.  Follow-Up Instructions: Return if symptoms worsen or fail to improve.   Ortho Exam  Patient is alert, oriented, no adenopathy, well-dressed, normal affect, normal respiratory effort. Examination patient has an antalgic gait.  She is a good dorsalis pedis pulse she has dorsiflexion to neutral with her knee extended.  She is tender to palpation along the medial column first metatarsal head status post great toe amputation.  She has no tenderness to palpation along the Achilles there is some nodular changes no tenderness palpation over the plantar fascia.  Patient's previous radiographs shows a fusion across  the base of the first metatarsal medial acneiform and amputation through the great toe MTP joint with clawing of the lesser toes worse with the second toe deviating medially.  Imaging: No results found. No images are attached to the encounter.  Labs: Lab Results  Component Value Date   HGBA1C 5.1 09/25/2011   HGBA1C 5.5 03/06/2011   HGBA1C  07/31/2010    5.1 (NOTE)                                                                       According to the ADA Clinical Practice Recommendations for 2011, when HbA1c is used as a screening test:   >=6.5%   Diagnostic of Diabetes Mellitus           (if abnormal result  is confirmed)  5.7-6.4%   Increased risk of developing Diabetes Mellitus  References:Diagnosis and Classification of Diabetes Mellitus,Diabetes WLNL,8921,19(ERDEY 1):S62-S69 and Standards of Medical Care in         Diabetes - 2011,Diabetes Care,2011,34  (Suppl 1):S11-S61.   REPTSTATUS 03/17/2016 FINAL 03/16/2016   GRAMSTAIN  08/25/2012    RARE WBC PRESENT, PREDOMINANTLY PMN RARE  SQUAMOUS EPITHELIAL CELLS PRESENT RARE GRAM POSITIVE COCCI IN PAIRS   CULT <10,000 COLONIES/mL INSIGNIFICANT GROWTH (A) 03/16/2016   LABORGA NO GROWTH 09/14/2013    @LABSALLVALUES (HGBA1)@  There is no height or weight on file to calculate BMI.  Orders:  Orders Placed This Encounter  Procedures  . XR Foot Complete Right   No orders of the defined types were placed in this encounter.    Procedures: No procedures performed  Clinical Data: No additional findings.  ROS:  All other systems negative, except as noted in the HPI. Review of Systems  Objective: Vital Signs: There were no vitals taken for this visit.  Specialty Comments:  No specialty comments available.  PMFS History: Patient Active Problem List   Diagnosis Date Noted  . Achilles tendon contracture, right 04/18/2016  . OAB (overactive bladder) 02/28/2016  . Urgency incontinence 02/28/2016  . Situational anxiety  06/24/2015  . Mass of arm 04/09/2015  . Deviated septum 11/17/2014  . Gastroesophageal reflux disease without esophagitis 09/02/2014  . Chronic pain syndrome 05/24/2014  . Bereavement 03/13/2014  . Bipolar affective disorder, depressed, severe ([place]) 03/11/2014  . Opiate dependence ([place]) 03/11/2014  . Intentional opiate overdose ([place]) 03/11/2014  . De Quervain's tenosynovitis, left 12/12/2013  . Nausea with vomiting 07/28/2013  . Thrombocytopenia, unspecified ([place]) 07/28/2013  . External hemorrhoids 02/16/2013  . Schizoaffective disorder, bipolar type ([place]) 03/31/2011   Past Medical History:  Diagnosis Date  . Anxiety   . Bipolar affective disorder, mixed, in partial or remission   . Depression   . Dysrhythmia    Left sided heart diease  . Headache(784.0)   . History of gallstones   . Kidney stones   . Mental disorder   . Migraine   . Pneumonia    Hx: of as a child  . PONV (postoperative nausea and vomiting)   . Renal disorder   . Sleep apnea    sleep test 2014, did not have osa  . Substance abuse (HCC)    Fentanyl, Percocet. last use 04/2011    Family History  Problem Relation Age of Onset  . Colon cancer Father 60  . Breast cancer Father   . Breast cancer Mother   . Uterine cancer Mother   . Stomach cancer Neg Hx   . Rectal cancer Neg Hx   . Esophageal cancer Neg Hx     Past Surgical History:  Procedure Laterality Date  . ABDOMINAL HYSTERECTOMY    . 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 BUNIONECTOMY     11/14/2011  . CHOLECYSTECTOMY    . COLONOSCOPY     2001, 2008.  Father has colon cancer  . FOOT SURGERY     right x 2  . NASAL SEPTOPLASTY W/ TURBINOPLASTY Bilateral 12/04/2014   Procedure: NASAL SEPTOPLASTY WITH BILATERAL TURBINATE REDUCTION;  Surgeon: Izora Gala, MD;  Location: [facility];  Service: ENT;  Laterality: Bilateral;  . TOTAL ABDOMINAL HYSTERECTOMY W/ BILATERAL SALPINGOOPHORECTOMY    . VAGINAL HYSTERECTOMY     Social History   Occupational History  . Occupation: Presenter, broadcasting  Tobacco Use  . Smoking status: Never Smoker  . Smokeless tobacco: Never Used  Substance and Sexual Activity  . Alcohol use: No    Alcohol/week: 0.0 oz  . Drug use: No  . Sexual activity: Not  on file

## 2017-03-16 LAB — HM PAP SMEAR: HM Pap smear: NEGATIVE

## 2017-03-24 ENCOUNTER — Ambulatory Visit: Payer: Self-pay | Admitting: Physician Assistant

## 2017-03-30 ENCOUNTER — Encounter (INDEPENDENT_AMBULATORY_CARE_PROVIDER_SITE_OTHER): Payer: Self-pay | Admitting: Orthopedic Surgery

## 2017-03-30 ENCOUNTER — Ambulatory Visit (INDEPENDENT_AMBULATORY_CARE_PROVIDER_SITE_OTHER): Payer: Medicare Other | Admitting: Orthopedic Surgery

## 2017-03-30 DIAGNOSIS — M6701 Short Achilles tendon (acquired), right ankle: Secondary | ICD-10-CM

## 2017-03-30 DIAGNOSIS — M7741 Metatarsalgia, right foot: Secondary | ICD-10-CM

## 2017-03-30 NOTE — Progress Notes (Signed)
Office Visit Note   Patient: [name]           Date of Birth: [birth year]           MRN: [id number] Visit Date: 03/30/2017              Requested by: Brunetta Jeans, PA-C [address] [place], [place] [address] PCP: Brunetta Jeans, PA-C  Chief Complaint  Patient presents with  . Right Foot - Follow-up      HPI: Patient is a 52 year old woman who presents complaining of pain across toes 234 and 5 on the right foot status post great toe amputation she is status post Achilles tendon reconstruction in [place] and status post gastrocnemius recession for heel cord contracture.  Patient states that she is now walking about 4 miles a day during work.  Assessment & Plan: Visit Diagnoses:  1. Achilles tendon contracture, right   2. Metatarsalgia, right foot     Plan: Discussed the importance of unloading the forefoot recommended a stiff soled Trail running shoe, sole orthotics, continue heel cord stretching, work on dorsiflexion strengthening.  Follow-Up Instructions: Return if symptoms worsen or fail to improve.   Ortho Exam  Patient is alert, oriented, no adenopathy, well-dressed, normal affect, normal respiratory effort. Examination patient has a good dorsalis pedis pulse she has good dorsiflexion of the ankle about 10 degrees past neutral with the knee extended.  She has tenderness to palpation beneath the metatarsal heads and her toes.  She is wearing a a soft soled shoes without socks or orthotics she has a high arch.  Imaging: No results found. No images are attached to the encounter.  Labs: Lab Results  Component Value Date   HGBA1C 5.1 09/25/2011   HGBA1C 5.5 03/06/2011   HGBA1C  07/31/2010    5.1 (NOTE)                                                                       According to the ADA Clinical Practice Recommendations for 2011, when HbA1c is used as a screening test:   >=6.5%   Diagnostic of Diabetes Mellitus           (if abnormal result  is  confirmed)  5.7-6.4%   Increased risk of developing Diabetes Mellitus  References:Diagnosis and Classification of Diabetes Mellitus,Diabetes GUYQ,0347,42(VZDGL 1):S62-S69 and Standards of Medical Care in         Diabetes - 2011,Diabetes Care,2011,34  (Suppl 1):S11-S61.   REPTSTATUS 03/17/2016 FINAL 03/16/2016   GRAMSTAIN  08/25/2012    RARE WBC PRESENT, PREDOMINANTLY PMN RARE SQUAMOUS EPITHELIAL CELLS PRESENT RARE GRAM POSITIVE COCCI IN PAIRS   CULT <10,000 COLONIES/mL INSIGNIFICANT GROWTH (A) 03/16/2016   LABORGA NO GROWTH 09/14/2013    @LABSALLVALUES (HGBA1)@  There is no height or weight on file to calculate BMI.  Orders:  No orders of the defined types were placed in this encounter.  No orders of the defined types were placed in this encounter.    Procedures: No procedures performed  Clinical Data: No additional findings.  ROS:  All other systems negative, except as noted in the HPI. Review of Systems  Objective: Vital Signs: There were no vitals taken for this visit.  Specialty Comments:  No specialty comments available.  PMFS History: Patient Active Problem List   Diagnosis Date Noted  . Achilles tendon contracture, right 04/18/2016  . OAB (overactive bladder) 02/28/2016  . Urgency incontinence 02/28/2016  . Situational anxiety 06/24/2015  . Mass of arm 04/09/2015  . Deviated septum 11/17/2014  . Gastroesophageal reflux disease without esophagitis 09/02/2014  . Chronic pain syndrome 05/24/2014  . Bereavement 03/13/2014  . Bipolar affective disorder, depressed, severe ([place]) 03/11/2014  . Opiate dependence ([place]) 03/11/2014  . Intentional opiate overdose ([place]) 03/11/2014  . De Quervain's tenosynovitis, left 12/12/2013  . Nausea with vomiting 07/28/2013  . Thrombocytopenia, unspecified ([place]) 07/28/2013  . External hemorrhoids 02/16/2013  . Metatarsalgia, right foot 05/18/2011  . Schizoaffective disorder, bipolar type ([place]) 03/31/2011   Past Medical  History:  Diagnosis Date  . Anxiety   . Bipolar affective disorder, mixed, in partial or remission   . Depression   . Dysrhythmia    Left sided heart diease  . Headache(784.0)   . History of gallstones   . Kidney stones   . Mental disorder   . Migraine   . Pneumonia    Hx: of as a child  . PONV (postoperative nausea and vomiting)   . Renal disorder   . Sleep apnea    sleep test 2014, did not have osa  . Substance abuse (HCC)    Fentanyl, Percocet. last use 04/2011    Family History  Problem Relation Age of Onset  . Colon cancer Father 35  . Breast cancer Father   . Breast cancer Mother   . Uterine cancer Mother   . Stomach cancer Neg Hx   . Rectal cancer Neg Hx   . Esophageal cancer Neg Hx     Past Surgical History:  Procedure Laterality Date  . ABDOMINAL HYSTERECTOMY    . 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 BUNIONECTOMY     11/14/2011  . CHOLECYSTECTOMY    . COLONOSCOPY     2001, 2008.  Father has colon cancer  . FOOT SURGERY     right x 2  . NASAL SEPTOPLASTY W/ TURBINOPLASTY Bilateral 12/04/2014   Procedure: NASAL SEPTOPLASTY WITH BILATERAL TURBINATE REDUCTION;  Surgeon: Izora Gala, MD;  Location: [facility];  Service: ENT;  Laterality: Bilateral;  . TOTAL ABDOMINAL HYSTERECTOMY W/ BILATERAL SALPINGOOPHORECTOMY    . VAGINAL HYSTERECTOMY     Social History   Occupational History  . Occupation: Presenter, broadcasting  Tobacco Use  . Smoking status: Never Smoker  . Smokeless tobacco: Never Used  Substance and Sexual Activity  . Alcohol use: No    Alcohol/week: 0.0 oz  . Drug use: No  . Sexual activity: Not on file

## 2017-03-31 ENCOUNTER — Ambulatory Visit: Payer: Medicare Other | Admitting: Physician Assistant

## 2017-03-31 ENCOUNTER — Encounter: Payer: Self-pay | Admitting: Physician Assistant

## 2017-03-31 ENCOUNTER — Other Ambulatory Visit: Payer: Self-pay

## 2017-03-31 VITALS — BP 100/72 | HR 95 | Temp 97.8°F | Resp 14 | Ht 66.0 in | Wt 218.0 lb

## 2017-03-31 DIAGNOSIS — E669 Obesity, unspecified: Secondary | ICD-10-CM

## 2017-03-31 DIAGNOSIS — Z6835 Body mass index (BMI) 35.0-35.9, adult: Secondary | ICD-10-CM

## 2017-03-31 NOTE — Progress Notes (Signed)
Patient presents to clinic today to discuss weight management. Body mass index is 35.19 kg/m. Patient would like to discuss weight loss options. Endorses 50 pound weight gain since initially being placed on her Bipolar medications. Has been unable to lose weight. Is very active with work walking throughout her entire shift. In terms of diet, patient endorses fast food meals 1-2 x daily. States she will eat at home on weekends. Also has midnight smoothies a few times per week. States she does not have time to meal prep. Patient would like referral to Bariatric surgery.  Past Medical History:  Diagnosis Date  . Anxiety   . Bipolar affective disorder, mixed, in partial or remission   . Depression   . Dysrhythmia    Left sided heart diease  . Headache(784.0)   . History of gallstones   . Kidney stones   . Mental disorder   . Migraine   . Pneumonia    Hx: of as a child  . PONV (postoperative nausea and vomiting)   . Renal disorder   . Sleep apnea    sleep test 2014, did not have osa  . 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 albuterol (PROVENTIL HFA;VENTOLIN HFA) 108 (90 Base) MCG/ACT inhaler Inhale 1-2 puffs into the lungs every 6 (six) hours as needed for wheezing or shortness of breath. 1 Inhaler 0  . lithium carbonate 300 MG capsule Take 600 mg by mouth at bedtime.     Marland Kitchen QUEtiapine (SEROQUEL) 400 MG tablet Take 2 tablets (800 mg total) by mouth at bedtime. For mood control 60 tablet 0  . traZODone (DESYREL) 50 MG tablet Take 100 mg by mouth daily.     Marland Kitchen zolpidem (AMBIEN) 10 MG tablet Take 10 mg by mouth at bedtime.     No current facility-administered medications on file prior to visit.     No Known Allergies  Family History  Problem Relation Age of Onset  . Colon cancer Father 25  . Breast cancer Father   . Breast cancer Mother   . Uterine cancer Mother   .  Stomach cancer Neg Hx   . Rectal cancer Neg Hx   . Esophageal cancer Neg Hx     Social History   Socioeconomic History  . Marital status: Married    Spouse name: None  . Number of children: 6  . Years of education: None  . Highest education level: None  Social Needs  . Financial resource strain: None  . Food insecurity - worry: None  . Food insecurity - inability: None  . Transportation needs - medical: None  . Transportation needs - non-medical: None  Occupational History  . Occupation: Presenter, broadcasting  Tobacco Use  . Smoking status: Never Smoker  . Smokeless tobacco: Never Used  Substance and Sexual Activity  . Alcohol use: No    Alcohol/week: 0.0 oz  . Drug use: No  . Sexual activity: None  Other Topics Concern  . 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  All other ROS are negative.  BP 100/72   Pulse 95   Temp 97.8 F (36.6 C) (Oral)   Resp 14   Ht 5\' 6"  (1.676 m)   Wt 218 lb (98.9 kg)   SpO2 97%   BMI 35.19 kg/m   Physical Exam  Constitutional: She is oriented to person, place, and time and well-developed, well-nourished, and in no distress.  HENT:  Head: Normocephalic and atraumatic.  Eyes: Conjunctivae are normal.  Neck: Neck supple.  Cardiovascular: Normal rate, regular rhythm, normal heart sounds and intact distal pulses.  Pulmonary/Chest: Effort normal and breath sounds normal. No respiratory distress. She has no wheezes. She has no rales. She exhibits no tenderness.  Neurological: She is alert and oriented to person, place, and time.  Skin: Skin is warm and dry. No rash noted.  Psychiatric: Affect normal.  Vitals reviewed.  Assessment/Plan: 1. Obesity (BMI  30-39.9) Multifactorial -- antipsychotic medications (long-term), sedentary lifestyle previously, very poor diet. No known current comorbidity but patient overdue for labs. Will check BMP to include glucose, Lipids and thyroid studies. Discussed proper exercise goals and dietary recommendations. She is going to work on calorie counting and meal prep. If there is a comorbid condition noted, we will start treatment and place referral to Bariatric Surgery. If negative, would recommend Obesity specialist.   - TSH - T4, free - Lipid Profile - Basic metabolic panel   Leeanne Rio, PA-C

## 2017-03-31 NOTE — Patient Instructions (Signed)
Please go to the lab today for blood work.  I will call you with your results. We will alter treatment regimen(s) if indicated by your results.   I want you to try and really work on prepping your meals for the work week to help you from having fasting food 1-2 x day. Keep a larger water bottle on hand to sip on throughout the day. This will help curb appetite so you are not over eating. Limit sodas.  Work up to 150 minutes of moderate intensity aerobic exercise weekly.  We can consider bariatric consult if there are any lab abnormalities.  Either way we have got to work on dietary changes to help with weight.    Exercising to Lose Weight Exercising can help you to lose weight. In order to lose weight through exercise, you need to do vigorous-intensity exercise. You can tell that you are exercising with vigorous intensity if you are breathing very hard and fast and cannot hold a conversation while exercising. Moderate-intensity exercise helps to maintain your current weight. You can tell that you are exercising at a moderate level if you have a higher heart rate and faster breathing, but you are still able to hold a conversation. How often should I exercise? Choose an activity that you enjoy and set realistic goals. Your health care provider can help you to make an activity plan that works for you. Exercise regularly as directed by your health care provider. This may include:  Doing resistance training twice each week, such as: ? Push-ups. ? Sit-ups. ? Lifting weights. ? Using resistance bands.  Doing a given intensity of exercise for a given amount of time. Choose from these options: ? 150 minutes of moderate-intensity exercise every week. ? 75 minutes of vigorous-intensity exercise every week. ? A mix of moderate-intensity and vigorous-intensity exercise every week.  Children, pregnant women, people who are out of shape, people who are overweight, and older adults may need to consult a  health care provider for individual recommendations. If you have any sort of medical condition, be sure to consult your health care provider before starting a new exercise program. What are some activities that can help me to lose weight?  Walking at a rate of at least 4.5 miles an hour.  Jogging or running at a rate of 5 miles per hour.  Biking at a rate of at least 10 miles per hour.  Lap swimming.  Roller-skating or in-line skating.  Cross-country skiing.  Vigorous competitive sports, such as football, basketball, and soccer.  Jumping rope.  Aerobic dancing. How can I be more active in my day-to-day activities?  Use the stairs instead of the elevator.  Take a walk during your lunch break.  If you drive, park your car farther away from work or school.  If you take public transportation, get off one stop early and walk the rest of the way.  Make all of your phone calls while standing up and walking around.  Get up, stretch, and walk around every 30 minutes throughout the day. What guidelines should I follow while exercising?  Do not exercise so much that you hurt yourself, feel dizzy, or get very short of breath.  Consult your health care provider prior to starting a new exercise program.  Wear comfortable clothes and shoes with good support.  Drink plenty of water while you exercise to prevent dehydration or heat stroke. Body water is lost during exercise and must be replaced.  Work out until Secretary/administrator  breathe faster and your heart beats faster. This information is not intended to replace advice given to you by your health care provider. Make sure you discuss any questions you have with your health care provider. Document Released: 04/05/2010 Document Revised: 08/09/2015 Document Reviewed: 08/04/2013 Elsevier Interactive Patient Education  [employer].

## 2017-04-03 IMAGING — DX DG CHEST 2V
2 series · 2 of 2 positions shown · non-contrast
Comparison: 09/27/2014

CLINICAL DATA: Nonproductive cough for 1 week

EXAM:
CHEST  2 VIEW

[chest pa]
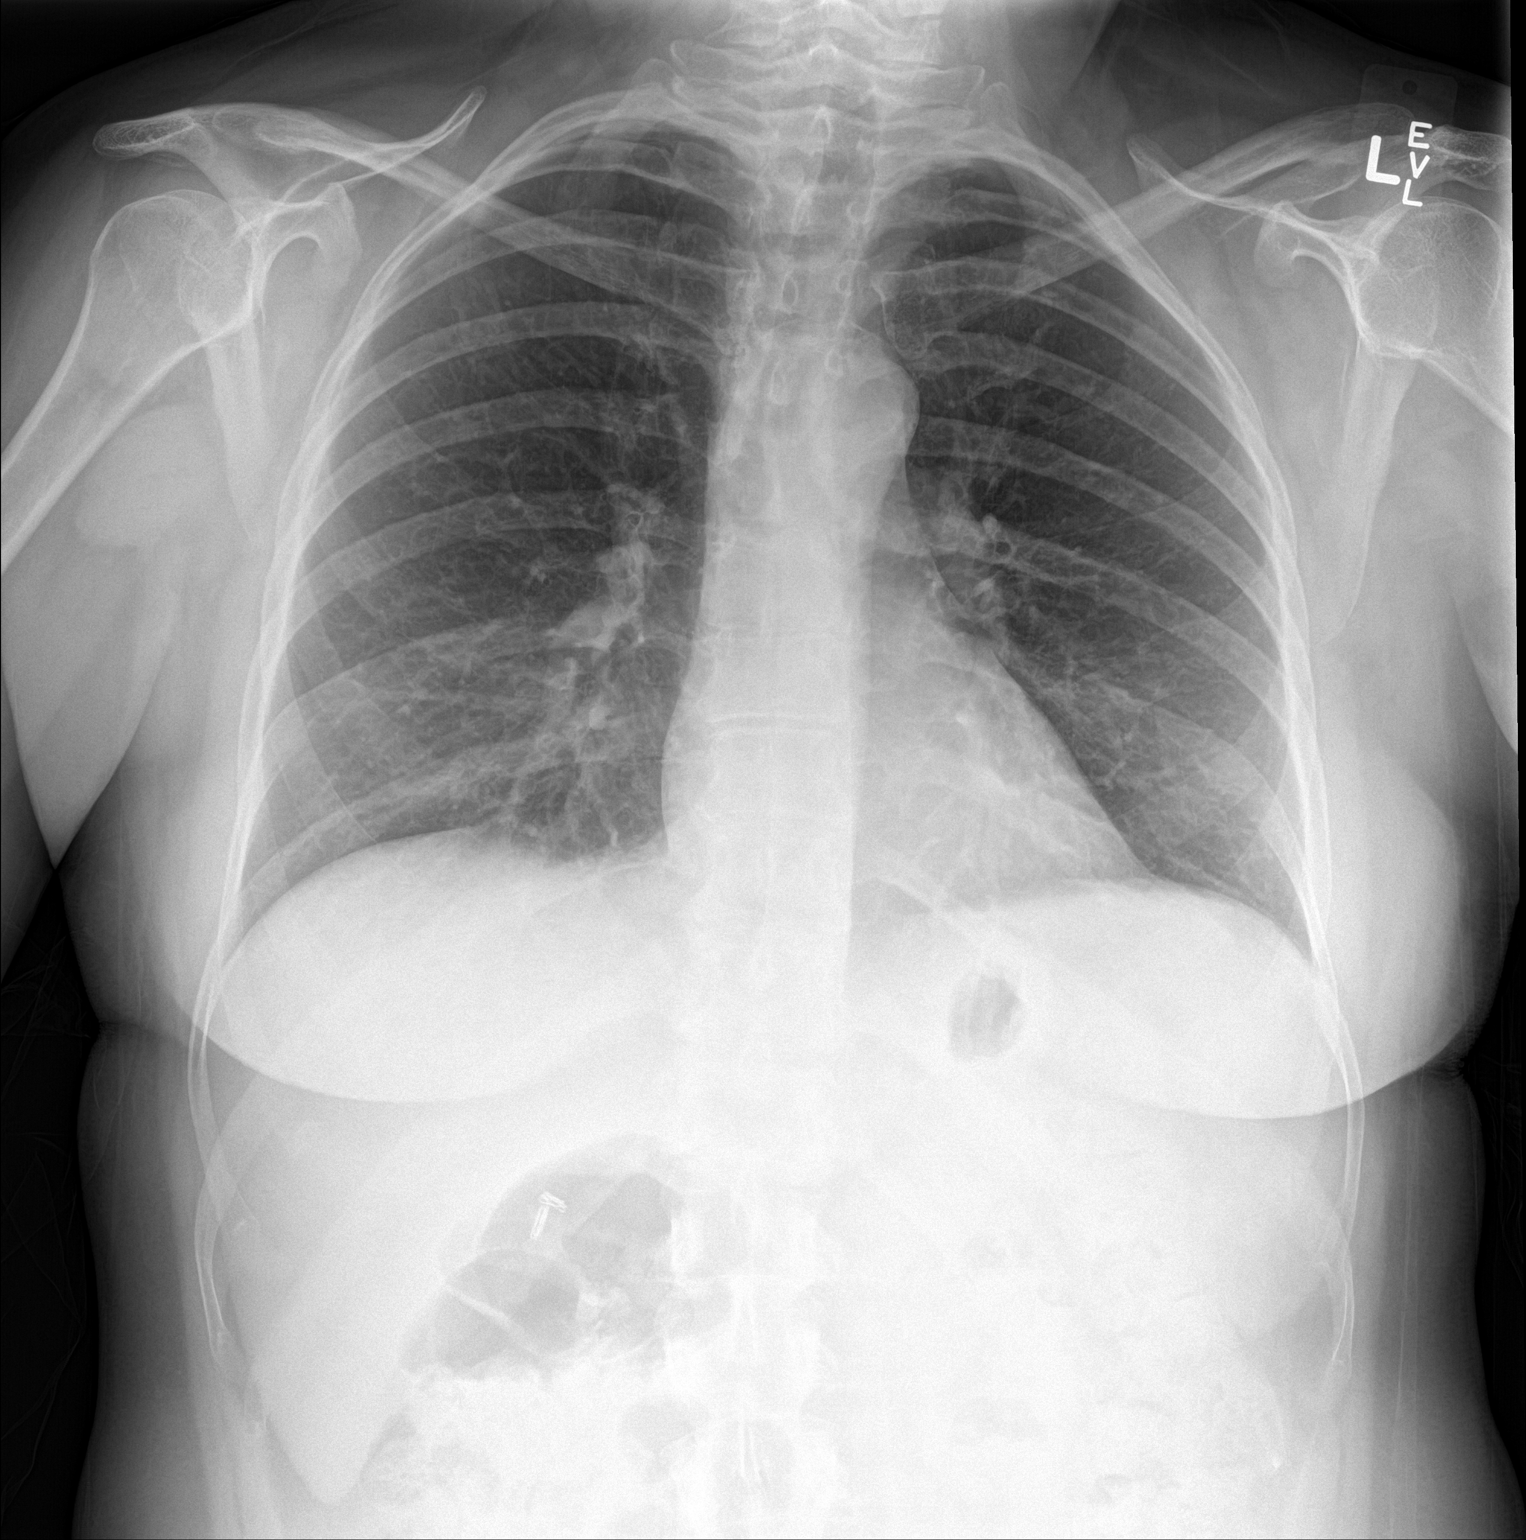

[chest lat]
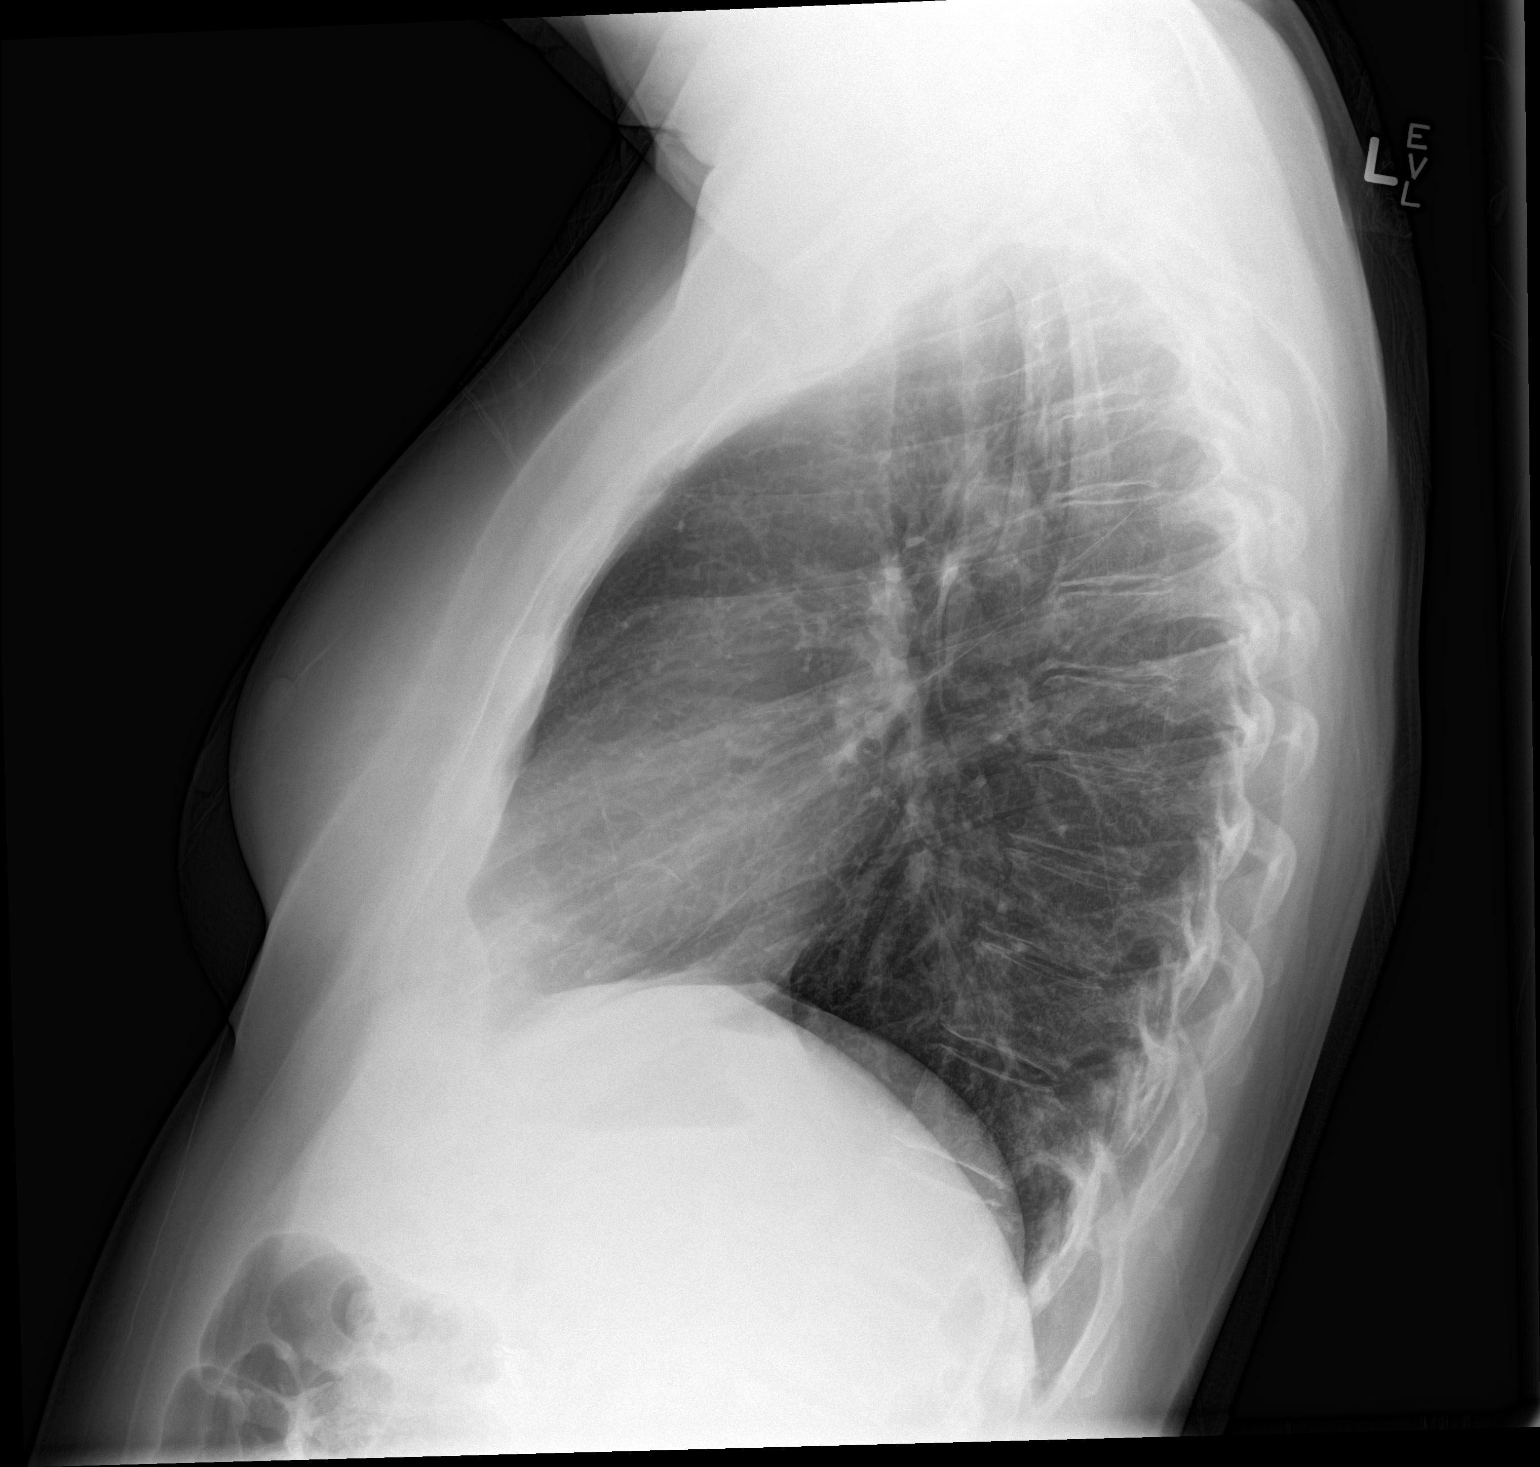

[2 of 2 positions shown; findings below may reference images not displayed]

FINDINGS: Normal cardiac silhouette. No effusion, infiltrate, pneumothorax. No
acute osseous abnormality. Oblong soft tissue density in the RIGHT
axilla measuring 2.5 x 4.5 cm.
IMPRESSION: No acute cardiopulmonary process.

Oblong soft tissue density in the RIGHT axilla. Recommend physical
exam to exclude lymphadenopathy.

## 2017-04-13 ENCOUNTER — Other Ambulatory Visit: Payer: Medicare Other

## 2017-04-13 LAB — LIPID PANEL
CHOL/HDL RATIO: 3
Cholesterol: 178 mg/dL (ref 0–200)
HDL: 57.1 mg/dL (ref >39.00)
LDL CALC: 106 mg/dL — AB (ref 0–99)
NONHDL: 120.7
TRIGLYCERIDES: 73 mg/dL (ref 0.0–149.0)
VLDL: 14.6 mg/dL (ref 0.0–40.0)

## 2017-04-13 LAB — BASIC METABOLIC PANEL
BUN: 12 mg/dL (ref 6–23)
CALCIUM: 9.4 mg/dL (ref 8.4–10.5)
CHLORIDE: 106 meq/L (ref 96–112)
CO2: 27 mEq/L (ref 19–32)
Creatinine, Ser: 0.9 mg/dL (ref 0.40–1.20)
GFR: 70.14 mL/min (ref >60.00)
Glucose, Bld: 110 mg/dL — ABNORMAL HIGH (ref 70–99)
Potassium: 3.9 mEq/L (ref 3.5–5.1)
Sodium: 142 mEq/L (ref 135–145)

## 2017-04-13 LAB — T4, FREE: Free T4: 1.35 ng/dL (ref 0.60–1.60)

## 2017-04-13 LAB — TSH: TSH: 0.02 u[IU]/mL — ABNORMAL LOW (ref 0.35–4.50)

## 2017-04-14 ENCOUNTER — Other Ambulatory Visit (INDEPENDENT_AMBULATORY_CARE_PROVIDER_SITE_OTHER): Payer: Medicare Other

## 2017-04-14 DIAGNOSIS — R7309 Other abnormal glucose: Secondary | ICD-10-CM | POA: Diagnosis not present

## 2017-04-14 LAB — HEMOGLOBIN A1C: HEMOGLOBIN A1C: 5 % (ref 4.6–6.5)

## 2017-04-24 ENCOUNTER — Other Ambulatory Visit: Payer: Self-pay | Admitting: Physician Assistant

## 2017-04-24 ENCOUNTER — Ambulatory Visit: Payer: Medicare Other | Admitting: Physician Assistant

## 2017-04-24 DIAGNOSIS — R7989 Other specified abnormal findings of blood chemistry: Secondary | ICD-10-CM

## 2017-05-12 ENCOUNTER — Other Ambulatory Visit (INDEPENDENT_AMBULATORY_CARE_PROVIDER_SITE_OTHER): Payer: Medicare Other

## 2017-05-12 DIAGNOSIS — R7989 Other specified abnormal findings of blood chemistry: Secondary | ICD-10-CM

## 2017-05-12 LAB — T4, FREE: FREE T4: 0.53 ng/dL — AB (ref 0.60–1.60)

## 2017-05-12 LAB — TSH: TSH: 0.47 u[IU]/mL (ref 0.35–4.50)

## 2017-05-19 ENCOUNTER — Ambulatory Visit (INDEPENDENT_AMBULATORY_CARE_PROVIDER_SITE_OTHER): Payer: Medicare Other | Admitting: Orthopedic Surgery

## 2017-05-19 ENCOUNTER — Encounter (INDEPENDENT_AMBULATORY_CARE_PROVIDER_SITE_OTHER): Payer: Self-pay | Admitting: Orthopedic Surgery

## 2017-05-19 VITALS — Ht 66.0 in | Wt 218.0 lb

## 2017-05-19 DIAGNOSIS — M7741 Metatarsalgia, right foot: Secondary | ICD-10-CM | POA: Diagnosis not present

## 2017-05-19 DIAGNOSIS — M205X1 Other deformities of toe(s) (acquired), right foot: Secondary | ICD-10-CM

## 2017-05-19 DIAGNOSIS — M6701 Short Achilles tendon (acquired), right ankle: Secondary | ICD-10-CM | POA: Diagnosis not present

## 2017-05-19 NOTE — Progress Notes (Signed)
Office Visit Note   Patient: [name]           Date of Birth: 1965/05/20           MRN: [id number] Visit Date: 05/19/2017              Requested by: Brunetta Jeans, PA-C [id number] [place], [place] [id number] PCP: Brunetta Jeans, PA-C  Chief Complaint  Patient presents with  . Right Foot - Follow-up    Achilles tendon contracture and metatarsalgia       HPI: Patient is a 52 year old woman who presents complaining of problems with her claw toes right foot she denies any burning pain or tingling states that she is very concerned about the clawing of her toes and wants this fixed.  Assessment & Plan: Visit Diagnoses:  1. Achilles tendon contracture, right   2. Metatarsalgia, right foot   3. Acquired claw toe, right     Plan: Discussed that with the amputation of the great toe I do not feel that we could straighten her toes and have them stay that way.  She does not have metatarsalgia we will place a arch support in her shoes she may increase the arch support as needed.  Discussed that this should help straighten out her toes a little with getting pressure proximal to the metatarsal heads.  Recommended knee-high compression stockings for her venous insufficiency.  Follow-Up Instructions: Return if symptoms worsen or fail to improve.   Ortho Exam  Patient is alert, oriented, no adenopathy, well-dressed, normal affect, normal respiratory effort. On examination patient has a good dorsalis pedis pulse she does have venous stasis swelling.  She has dorsiflexion to neutral.  She has a high arch she has mild fixed flexion of the second toe PIP joint there is flexible clawing of the lesser toes.  There is no callus there is no redness no swelling no ulceration.  Imaging: No results found. No images are attached to the encounter.  Labs: Lab Results  Component Value Date   HGBA1C 5.0 04/14/2017   HGBA1C 5.1 09/25/2011   HGBA1C 5.5 03/06/2011   REPTSTATUS 03/17/2016  FINAL 03/16/2016   GRAMSTAIN  08/25/2012    RARE WBC PRESENT, PREDOMINANTLY PMN RARE SQUAMOUS EPITHELIAL CELLS PRESENT RARE GRAM POSITIVE COCCI IN PAIRS   CULT <10,000 COLONIES/mL INSIGNIFICANT GROWTH (A) 03/16/2016   LABORGA NO GROWTH 09/14/2013    @LABSALLVALUES (HGBA1)@  Body mass index is 35.19 kg/m.  Orders:  No orders of the defined types were placed in this encounter.  No orders of the defined types were placed in this encounter.    Procedures: No procedures performed  Clinical Data: No additional findings.  ROS:  All other systems negative, except as noted in the HPI. Review of Systems  Objective: Vital Signs: Ht 5\' 6"  (1.676 m)   Wt 218 lb (98.9 kg)   BMI 35.19 kg/m   Specialty Comments:  No specialty comments available.  PMFS History: Patient Active Problem List   Diagnosis Date Noted  . Achilles tendon contracture, right 04/18/2016  . OAB (overactive bladder) 02/28/2016  . Urgency incontinence 02/28/2016  . Situational anxiety 06/24/2015  . Mass of arm 04/09/2015  . Deviated septum 11/17/2014  . Gastroesophageal reflux disease without esophagitis 09/02/2014  . Chronic pain syndrome 05/24/2014  . Bereavement 03/13/2014  . Bipolar affective disorder, depressed, severe ([place]) 03/11/2014  . Opiate dependence ([place]) 03/11/2014  . Intentional opiate overdose ([place]) 03/11/2014  . Harriet Pho  tenosynovitis, left 12/12/2013  . Nausea with vomiting 07/28/2013  . Thrombocytopenia, unspecified ([place]) 07/28/2013  . External hemorrhoids 02/16/2013  . Metatarsalgia, right foot 05/18/2011  . Schizoaffective disorder, bipolar type ([place]) 03/31/2011   Past Medical History:  Diagnosis Date  . Anxiety   . Bipolar affective disorder, mixed, in partial or remission   . Depression   . Dysrhythmia    Left sided heart diease  . Headache(784.0)   . History of gallstones   . Kidney stones   . Mental disorder   . Migraine   . Pneumonia    Hx: of as a child  .  PONV (postoperative nausea and vomiting)   . Renal disorder   . Sleep apnea    sleep test 2014, did not have osa  . Substance abuse (HCC)    Fentanyl, Percocet. last use 04/2011    Family History  Problem Relation Age of Onset  . Colon cancer Father 71  . Breast cancer Father   . Breast cancer Mother   . Uterine cancer Mother   . Stomach cancer Neg Hx   . Rectal cancer Neg Hx   . Esophageal cancer Neg Hx     Past Surgical History:  Procedure Laterality Date  . ABDOMINAL HYSTERECTOMY    . 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 BUNIONECTOMY     11/14/2011  . CHOLECYSTECTOMY    . COLONOSCOPY     2001, 2008.  Father has colon cancer  . FOOT SURGERY     right x 2  . NASAL SEPTOPLASTY W/ TURBINOPLASTY Bilateral 12/04/2014   Procedure: NASAL SEPTOPLASTY WITH BILATERAL TURBINATE REDUCTION;  Surgeon: Izora Gala, MD;  Location: [facility];  Service: ENT;  Laterality: Bilateral;  . TOTAL ABDOMINAL HYSTERECTOMY W/ BILATERAL SALPINGOOPHORECTOMY    . VAGINAL HYSTERECTOMY     Social History   Occupational History  . Occupation: Presenter, broadcasting  Tobacco Use  . Smoking status: Never Smoker  . Smokeless tobacco: Never Used  Substance and Sexual Activity  . Alcohol use: No    Alcohol/week: 0.0 oz  . Drug use: No  . Sexual activity: Not on file

## 2017-06-02 ENCOUNTER — Emergency Department (HOSPITAL_COMMUNITY): Payer: Medicare Other

## 2017-06-02 ENCOUNTER — Emergency Department (HOSPITAL_COMMUNITY)
Admission: EM | Admit: 2017-06-02 | Discharge: 2017-06-02 | Disposition: A | Discharge status: Home / Self Care | Payer: Medicare Other | Attending: Emergency Medicine | Admitting: Emergency Medicine

## 2017-06-02 ENCOUNTER — Other Ambulatory Visit: Payer: Self-pay

## 2017-06-02 ENCOUNTER — Encounter (HOSPITAL_COMMUNITY): Payer: Self-pay | Admitting: Emergency Medicine

## 2017-06-02 DIAGNOSIS — Y999 Unspecified external cause status: Secondary | ICD-10-CM | POA: Diagnosis not present

## 2017-06-02 DIAGNOSIS — S99921A Unspecified injury of right foot, initial encounter: Secondary | ICD-10-CM | POA: Diagnosis present

## 2017-06-02 DIAGNOSIS — Z89411 Acquired absence of right great toe: Secondary | ICD-10-CM | POA: Diagnosis not present

## 2017-06-02 DIAGNOSIS — Y929 Unspecified place or not applicable: Secondary | ICD-10-CM | POA: Diagnosis not present

## 2017-06-02 DIAGNOSIS — Z79899 Other long term (current) drug therapy: Secondary | ICD-10-CM | POA: Insufficient documentation

## 2017-06-02 DIAGNOSIS — W108XXA Fall (on) (from) other stairs and steps, initial encounter: Secondary | ICD-10-CM | POA: Diagnosis not present

## 2017-06-02 DIAGNOSIS — Y939 Activity, unspecified: Secondary | ICD-10-CM | POA: Diagnosis not present

## 2017-06-02 DIAGNOSIS — S86001A Unspecified injury of right Achilles tendon, initial encounter: Secondary | ICD-10-CM | POA: Diagnosis not present

## 2017-06-02 MED ORDER — OXYCODONE-ACETAMINOPHEN 5-325 MG PO TABS: 1.0000 | ORAL_TABLET | ORAL | 0 refills | Status: DC | PRN

## 2017-06-02 NOTE — ED Provider Notes (Signed)
[facility] DEPT Provider Note   CSN: [id number] Arrival date & time: 06/02/17  1324     History   Chief Complaint Chief Complaint  Patient presents with  . Foot Pain    HPI [name] is a 52 y.o. female who presents to the ED with right heel pain that started 3 days ago after she missed a step and it caused severe pain in the back of the heel. Patient with hx of rupture of the tendon 10 months ago and Dr. Sharol Given has been following her.   HPI  Past Medical History:  Diagnosis Date  . Anxiety   . Bipolar affective disorder, mixed, in partial or remission   . Depression   . Dysrhythmia    Left sided heart diease  . Headache(784.0)   . History of gallstones   . Kidney stones   . Mental disorder   . Migraine   . Pneumonia    Hx: of as a child  . PONV (postoperative nausea and vomiting)   . Renal disorder   . Sleep apnea    sleep test 2014, did not have osa  . Substance abuse (HCC)    Fentanyl, Percocet. last use 04/2011    Patient Active Problem List   Diagnosis Date Noted  . Achilles tendon contracture, right 04/18/2016  . OAB (overactive bladder) 02/28/2016  . Urgency incontinence 02/28/2016  . Situational anxiety 06/24/2015  . Mass of arm 04/09/2015  . Deviated septum 11/17/2014  . Gastroesophageal reflux disease without esophagitis 09/02/2014  . Chronic pain syndrome 05/24/2014  . Bereavement 03/13/2014  . Bipolar affective disorder, depressed, severe ([place]) 03/11/2014  . Opiate dependence ([place]) 03/11/2014  . Intentional opiate overdose ([place]) 03/11/2014  . De Quervain's tenosynovitis, left 12/12/2013  . Nausea with vomiting 07/28/2013  . Thrombocytopenia, unspecified ([place]) 07/28/2013  . External hemorrhoids 02/16/2013  . Metatarsalgia, right foot 05/18/2011  . Schizoaffective disorder, bipolar type ([place]) 03/31/2011    Past Surgical History:  Procedure Laterality Date  . ABDOMINAL HYSTERECTOMY    . 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 BUNIONECTOMY     11/14/2011  . CHOLECYSTECTOMY    . COLONOSCOPY     2001, 2008.  Father has colon cancer  . FOOT SURGERY     right x 2  . NASAL SEPTOPLASTY W/ TURBINOPLASTY Bilateral 12/04/2014   Procedure: NASAL SEPTOPLASTY WITH BILATERAL TURBINATE REDUCTION;  Surgeon: Izora Gala, MD;  Location: [facility];  Service: ENT;  Laterality: Bilateral;  . TOTAL ABDOMINAL HYSTERECTOMY W/ BILATERAL SALPINGOOPHORECTOMY    . VAGINAL HYSTERECTOMY      OB History    No data available       Home Medications    Prior to Admission medications   Medication Sig Start Date End Date Taking? Authorizing Provider  albuterol (PROVENTIL HFA;VENTOLIN HFA) 108 (90 Base) MCG/ACT inhaler Inhale 1-2 puffs into the lungs every 6 (six) hours as needed for wheezing or shortness of breath. 03/14/16   Barnet Glasgow, NP  lithium carbonate 300 MG capsule Take 600 mg by mouth at bedtime.  05/12/14   [provider]  oxyCODONE-acetaminophen (PERCOCET/ROXICET) 5-325 MG tablet Take 1 tablet by mouth every 4 (four) hours as needed for severe pain. 06/02/17   Ashley Murrain, NP  QUEtiapine (SEROQUEL) 400 MG tablet  Take 2 tablets (800 mg total) by mouth at bedtime. For mood control 03/15/14   Lindell Spar I, NP  traZODone (DESYREL) 50 MG tablet Take 100 mg by mouth daily.  04/26/15   [provider]  zolpidem (AMBIEN) 10 MG tablet Take 10 mg by mouth at bedtime.    [provider]    Family History Family History  Problem Relation Age of Onset  . Colon cancer Father 27  . Breast cancer Father   . Breast cancer Mother   . Uterine cancer Mother   . Stomach cancer Neg Hx   . Rectal cancer Neg Hx   . Esophageal cancer Neg Hx     Social History Social History   Tobacco Use  .  Smoking status: Never Smoker  . Smokeless tobacco: Never Used  Substance Use Topics  . Alcohol use: No    Alcohol/week: 0.0 oz  . Drug use: No     Allergies   Patient has no known allergies.   Review of Systems Review of Systems  Musculoskeletal: Positive for arthralgias.       Right ankle/heel.  All other systems reviewed and are negative.    Physical Exam Updated Vital Signs BP 136/77   Pulse 84   Temp 98.1 F (36.7 C) (Oral)   Resp 16   Ht 5\' 6"  (1.676 m)   Wt 95.3 kg (210 lb)   SpO2 100%   BMI 33.89 kg/m   Physical Exam  Constitutional: She appears well-developed and well-nourished. No distress.  HENT:  Head: Normocephalic.  Eyes: EOM are normal.  Neck: Neck supple.  Cardiovascular: Normal rate.  Pulmonary/Chest: Effort normal.  Musculoskeletal:       Right ankle: Achilles tendon exhibits pain and defect.       Right foot: There is tenderness. There is no swelling and no deformity. Decreased range of motion: due to pain.       Feet:  Right toe has been amputated. Pedal pulse 2+. Tender with palpation to the posterior aspect of the right heel.  Neurological: She is alert.  Skin: Skin is warm and dry.  Psychiatric: She has a normal mood and affect. Her behavior is normal.  Nursing note and vitals reviewed.    ED Treatments / Results  Labs (all labs ordered are listed, but only abnormal results are displayed) Labs Reviewed - No data to display  Radiology Dg Ankle Complete Left  Result Date: 06/02/2017 CLINICAL DATA:  [place] on steps, question Achilles rupture EXAM: LEFT ANKLE COMPLETE - 3+ VIEW COMPARISON:  LEFT foot radiographs 01/13/2012 FINDINGS: Bones appear demineralized. Prior fusion of the first/second [facility] joint. Remaining joint spaces preserved. No acute fracture, dislocation, or bone destruction. Lucency at the tarsal navicular question related to degenerative changes or prior surgery. Abnormal thickening of the soft tissues at the posterior  aspect of the ankle approximately 6.3 cm length by 2.5 cm AP, could represent Achilles injury and/or prior surgery. IMPRESSION: No acute osseous abnormalities. Prior first and second TMT joint fusion. Significant soft tissue thickening at the posterior ankle at the level of the distal Achilles tendon extending to the mild tendinous junction, could be related to Achilles injury and/or prior surgery; these soft tissue changes would be better evaluated by MR. Electronically Signed   By: Lavonia Dana M.D.   On: 06/02/2017 17:13    Procedures Procedures (including critical care time)  Medications Ordered in ED Medications - No data to display   Initial Impression / Assessment  and Plan / ED Course  I have reviewed the triage vital signs and the nursing notes. 52 y.o. female here with pain to the right ankle/heel s/p injury with hx of achilles rupture 10 months ago stable for d/c. Placed in cam walker and pain managed. She will call Dr. Sharol Given tomorrow for follow up. She will return here as needed for problems.   Final Clinical Impressions(s) / ED Diagnoses   Final diagnoses:  Achilles tendon injury, right, initial encounter    ED Discharge Orders        Ordered    oxyCODONE-acetaminophen (PERCOCET/ROXICET) 5-325 MG tablet  Every 4 hours PRN     06/02/17 1745       Janit Bern [place], NP 06/02/17 Barton, Ankit, MD 06/03/17 1252

## 2017-06-02 NOTE — Discharge Instructions (Signed)
Wear the boot until you can follow up with Dr. Sharol Given. Call for an appointment.

## 2017-06-02 NOTE — ED Triage Notes (Signed)
Pt here for possible achilles tendon rupture. Says this happened in May and had surgery to correct it. She stepped off a step and felt it pop.

## 2017-06-08 ENCOUNTER — Ambulatory Visit (INDEPENDENT_AMBULATORY_CARE_PROVIDER_SITE_OTHER): Payer: Medicare Other | Admitting: Orthopedic Surgery

## 2017-06-08 ENCOUNTER — Encounter (INDEPENDENT_AMBULATORY_CARE_PROVIDER_SITE_OTHER): Payer: Self-pay | Admitting: Orthopedic Surgery

## 2017-06-08 ENCOUNTER — Encounter: Payer: Self-pay | Admitting: Gastroenterology

## 2017-06-08 VITALS — Ht 66.0 in | Wt 210.0 lb

## 2017-06-08 DIAGNOSIS — M6701 Short Achilles tendon (acquired), right ankle: Secondary | ICD-10-CM

## 2017-06-08 MED ORDER — OXYCODONE-ACETAMINOPHEN 5-325 MG PO TABS: 1.0000 | ORAL_TABLET | ORAL | 0 refills | Status: DC | PRN

## 2017-06-08 NOTE — Progress Notes (Signed)
Office Visit Note   Patient: [name]           Date of Birth: 05/30/1965           MRN: [id number] Visit Date: 06/08/2017              Requested by: Brunetta Jeans, PA-C [address] [place], [place] [address] PCP: Brunetta Jeans, PA-C  Chief Complaint  Patient presents with  . Right Ankle - Pain, Edema    H/o Achilles tendon rupture and repair in [place] 08/05/16      HPI: Patient is a 52 year old woman who states she stepped off a curb and missed a step about 2 weeks ago.  Patient states she is been having pain along the Achilles more proximally in her calf medially.  She states she was placed in a Cam walker and this did not help.  She is status post Achilles tendon reconstruction.  Patient states it hurts most when she is driving and she is currently a driver for [employer]  Assessment & Plan: Visit Diagnoses:  1. Achilles tendon contracture, right     Plan: Recommended using her heel lift regular shoewear recommended CBD lotion to help as an anti-inflammatory.  Discussed that the motion of driving with her right foot will irritate the tendinitis until the sprain of the musculotendinous junction of the gastrocnemius muscle resolves.  Follow-Up Instructions: Return if symptoms worsen or fail to improve.   Ortho Exam  Patient is alert, oriented, no adenopathy, well-dressed, normal affect, normal respiratory effort. Examination patient's Achilles tendon is intact compression of the calf reproduces plantarflexion she has good pulses.  The calf is palpated and the Achilles tendon is intact there is no palpable defects.  She is tender to palpation over the medial head of the gastrocnemius muscle at the musculotendinous junction consistent with a sprain.  There is a slight defect in this area there is no ecchymosis no bruising.  Imaging: No results found. No images are attached to the encounter.  Labs: Lab Results  Component Value Date   HGBA1C 5.0 04/14/2017   HGBA1C 5.1 09/25/2011   HGBA1C 5.5 03/06/2011   REPTSTATUS 03/17/2016 FINAL 03/16/2016   GRAMSTAIN  08/25/2012    RARE WBC PRESENT, PREDOMINANTLY PMN RARE SQUAMOUS EPITHELIAL CELLS PRESENT RARE GRAM POSITIVE COCCI IN PAIRS   CULT <10,000 COLONIES/mL INSIGNIFICANT GROWTH (A) 03/16/2016   LABORGA NO GROWTH 09/14/2013    @LABSALLVALUES (HGBA1)@  Body mass index is 33.89 kg/m.  Orders:  No orders of the defined types were placed in this encounter.  Meds ordered this encounter  Medications  . oxyCODONE-acetaminophen (PERCOCET/ROXICET) 5-325 MG tablet    Sig: Take 1 tablet by mouth every 4 (four) hours as needed for severe pain.    Dispense:  10 tablet    Refill:  0     Procedures: No procedures performed  Clinical Data: No additional findings.  ROS:  All other systems negative, except as noted in the HPI. Review of Systems  Objective: Vital Signs: Ht 5\' 6"  (1.676 m)   Wt 210 lb (95.3 kg)   BMI 33.89 kg/m   Specialty Comments:  No specialty comments available.  PMFS History: Patient Active Problem List   Diagnosis Date Noted  . Achilles tendon contracture, right 04/18/2016  . OAB (overactive bladder) 02/28/2016  . Urgency incontinence 02/28/2016  . Situational anxiety 06/24/2015  . Mass of arm 04/09/2015  . Deviated septum 11/17/2014  . Gastroesophageal reflux disease without  esophagitis 09/02/2014  . Chronic pain syndrome 05/24/2014  . Bereavement 03/13/2014  . Bipolar affective disorder, depressed, severe ([facility]) 03/11/2014  . Opiate dependence ([place]) 03/11/2014  . Intentional opiate overdose ([place]) 03/11/2014  . De Quervain's tenosynovitis, left 12/12/2013  . Nausea with vomiting 07/28/2013  . Thrombocytopenia, unspecified ([facility]) 07/28/2013  . External hemorrhoids 02/16/2013  . Metatarsalgia, right foot 05/18/2011  . Schizoaffective disorder, bipolar type ([place]) 03/31/2011   Past Medical History:  Diagnosis Date  . Anxiety   . Bipolar affective disorder,  mixed, in partial or remission   . Depression   . Dysrhythmia    Left sided heart diease  . Headache(784.0)   . History of gallstones   . Kidney stones   . Mental disorder   . Migraine   . Pneumonia    Hx: of as a child  . PONV (postoperative nausea and vomiting)   . Renal disorder   . Sleep apnea    sleep test 2014, did not have osa  . Substance abuse (HCC)    Fentanyl, Percocet. last use 04/2011    Family History  Problem Relation Age of Onset  . Colon cancer Father 59  . Breast cancer Father   . Breast cancer Mother   . Uterine cancer Mother   . Stomach cancer Neg Hx   . Rectal cancer Neg Hx   . Esophageal cancer Neg Hx     Past Surgical History:  Procedure Laterality Date  . ABDOMINAL HYSTERECTOMY    . 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 BUNIONECTOMY     11/14/2011  . CHOLECYSTECTOMY    . COLONOSCOPY     2001, 2008.  Father has colon cancer  . FOOT SURGERY     right x 2  . NASAL SEPTOPLASTY W/ TURBINOPLASTY Bilateral 12/04/2014   Procedure: NASAL SEPTOPLASTY WITH BILATERAL TURBINATE REDUCTION;  Surgeon: Izora Gala, MD;  Location: [facility];  Service: ENT;  Laterality: Bilateral;  . TOTAL ABDOMINAL HYSTERECTOMY W/ BILATERAL SALPINGOOPHORECTOMY    . VAGINAL HYSTERECTOMY     Social History   Occupational History  . Occupation: Presenter, broadcasting  Tobacco Use  . Smoking status: Never Smoker  . Smokeless tobacco: Never Used  Substance and Sexual Activity  . Alcohol use: No    Alcohol/week: 0.0 oz  . Drug use: No    Types: Other-see comments, Fentanyl, Hydrocodone  . Sexual activity: Not on file

## 2017-07-22 ENCOUNTER — Encounter

## 2017-07-22 ENCOUNTER — Ambulatory Visit: Payer: Medicare Other | Admitting: Gastroenterology

## 2017-07-22 ENCOUNTER — Encounter: Payer: Self-pay | Admitting: Gastroenterology

## 2017-07-22 VITALS — HR 76 | Ht 66.0 in | Wt 220.0 lb

## 2017-07-22 DIAGNOSIS — K219 Gastro-esophageal reflux disease without esophagitis: Secondary | ICD-10-CM

## 2017-07-22 MED ORDER — OMEPRAZOLE 40 MG PO CPDR: 40.0000 mg | DELAYED_RELEASE_CAPSULE | Freq: Every day | ORAL | 11 refills | Status: DC

## 2017-07-22 NOTE — Progress Notes (Signed)
Review of pertinent gastrointestinal problems: 1. father had colon cancer in his 38's; colonoscopy 2013 Dr.  Ardis Hughs with poor prep, recommended repeat in a few weeks with double prep.  Repeat colonoscopy 11/2013: no polyps or cancers, recommended recall at 5 years; colon was granular, TI was normal; mutliple biopsies were all normal.  HPI: This is a very pleasant 52 year old woman  who was referred to me by Brunetta Jeans, PA-C  to evaluate GERD.    Chief complaint is GERD  For at least a year or 2 she has had significant acid-like symptoms including acid taste in her mouth when she bends over, water brash at night, esophageal spasms after eating which have a somewhat dysphasia-like component.  Weight up 40 pounds in a year.  Has not tried anti acid meds  She has no overt GI bleeding  No fevers or chills.  She drinks multiple Dr. 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 All other review negative.   Past Medical History:  Diagnosis Date  . Anxiety   . Bipolar affective disorder, mixed, in partial or remission   . Depression   . Dysrhythmia    Left sided heart diease  . Headache(784.0)   . History of gallstones   . Kidney stones   . Mental disorder   . Migraine   . Pneumonia    Hx: of as a child  . PONV (postoperative nausea and vomiting)   . Renal disorder   . Sleep apnea    sleep test 2014, did not have osa  . Substance abuse (HCC)    Fentanyl, Percocet. last use 04/2011    Past Surgical History:  Procedure Laterality Date  . ABDOMINAL HYSTERECTOMY    . 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 BUNIONECTOMY     11/14/2011  . CHOLECYSTECTOMY    . COLONOSCOPY      2001, 2008.  Father has colon cancer  . FOOT SURGERY     right x 2  . NASAL SEPTOPLASTY W/ TURBINOPLASTY Bilateral 12/04/2014   Procedure: NASAL SEPTOPLASTY WITH BILATERAL TURBINATE REDUCTION;  Surgeon: Izora Gala, MD;  Location: [facility];  Service: ENT;  Laterality: Bilateral;  . TOTAL ABDOMINAL HYSTERECTOMY W/ BILATERAL SALPINGOOPHORECTOMY    . VAGINAL HYSTERECTOMY      Current Outpatient Medications  Medication Sig Dispense Refill  . albuterol (PROVENTIL HFA;VENTOLIN HFA) 108 (90 Base) MCG/ACT inhaler Inhale 1-2 puffs into the lungs every 6 (six) hours as needed for wheezing or shortness of breath. 1 Inhaler 0  . lithium carbonate 300 MG capsule Take 600 mg by mouth at bedtime.     Marland Kitchen QUEtiapine (SEROQUEL) 400 MG tablet Take 2 tablets (800 mg total) by mouth at bedtime. For mood control 60 tablet 0  . traZODone (DESYREL) 50 MG tablet Take 100 mg by mouth daily.     Marland Kitchen zolpidem (AMBIEN) 10 MG tablet Take 10 mg by mouth at bedtime.     No current facility-administered medications for this visit.     Allergies as of 07/22/2017  . (No Known Allergies)    Family History  Problem Relation Age of Onset  . Colon cancer Father 3  .  Breast cancer Father   . Breast cancer Mother   . Uterine cancer Mother   . Stomach cancer Neg Hx   . Rectal cancer Neg Hx   . Esophageal cancer Neg Hx     Social History   Socioeconomic History  . Marital status: Married    Spouse name: Not on file  . Number of children: 6  . Years of education: Not on file  . Highest education level: Not on file  Occupational History  . Occupation: Presenter, broadcasting  Social Needs  . Financial resource strain: Not on file  . Food insecurity:    Worry: Not on file    Inability: Not on file  . Transportation needs:    Medical: Not on file    Non-medical: Not on file  Tobacco Use  . Smoking status: Never Smoker  . Smokeless tobacco: Never Used  Substance and Sexual Activity  . Alcohol use: No     Alcohol/week: 0.0 oz  . Drug use: No    Types: Other-see comments, Fentanyl, Hydrocodone  . Sexual activity: Not on file  Lifestyle  . Physical activity:    Days per week: Not on file    Minutes per session: Not on file  . Stress: Not on file  Relationships  . Social connections:    Talks on phone: Not on file    Gets together: Not on file    Attends religious service: Not on file    Active member of club or organization: Not on file    Attends meetings of clubs or organizations: Not on file    Relationship status: Not on file  . Intimate partner violence:    Fear of current or ex partner: Not on file    Emotionally abused: Not on file    Physically abused: Not on file    Forced sexual activity: Not on file  Other Topics Concern  . 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)   [place] in [place]   parents retired in [place], [place]   Married 22 years     2 sons ( 34, 49 )   4 daughters ( 21, 76,  50, 11)Smoking Status:  never   Does Patient Exercise:  no   Caffeine use/day:  4-5 beverages daily     Physical Exam: Pulse 76   Ht 5\' 6"  (1.676 m)   Wt 220 lb (99.8 kg)   BMI 35.51 kg/m  Constitutional: generally well-appearing Psychiatric: alert and oriented x3 Eyes: extraocular movements intact Mouth: oral pharynx moist, no lesions Neck: supple no lymphadenopathy Cardiovascular: heart regular rate and rhythm Lungs: clear to auscultation bilaterally Abdomen: soft, nontender, nondistended, no obvious ascites, no peritoneal signs, normal bowel sounds Extremities: no lower extremity edema bilaterally Skin: no lesions on visible extremities   Assessment and plan: 52 y.o. female with classic GERD  She has quite classic GERD symptoms and I recommended she try proton pump inhibitor 20 to 30 minutes prior to her breakfast meal daily as well as H2 blocker at bedtime every night.  She will return to see me in 3 to 4 months and if she  is still bothered by the spasm, dysphasia-like symptoms then I would recommend EGD at that point.  If her symptoms are all improved then she would not need any further testing.    Please see the "Patient Instructions" section for addition details about the plan.   Owens Loffler, MD [facility] 07/22/2017, 10:42 AM  Cc: Brunetta Jeans, PA-C

## 2017-07-22 NOTE — Patient Instructions (Addendum)
Omeprazole 40mg  pills, one pill before your lunch meal, 30 with 11 refills. Also start ranitidine 150mg  pills, one pill at bedtime every night. Please return to see Dr. Ardis Hughs in 3 months.  Normal BMI (Body Mass Index- based on height and weight) is between 19 and 25. Your BMI today is Body mass index is 35.51 kg/m. Marland Kitchen Please consider follow up  regarding your BMI with your Primary Care Provider.

## 2017-07-29 ENCOUNTER — Emergency Department (HOSPITAL_COMMUNITY)
Admission: EM | Admit: 2017-07-29 | Discharge: 2017-07-29 | Disposition: A | Discharge status: Home / Self Care | Payer: Medicare Other | Attending: Emergency Medicine | Admitting: Emergency Medicine

## 2017-07-29 ENCOUNTER — Emergency Department (HOSPITAL_COMMUNITY): Payer: Medicare Other

## 2017-07-29 ENCOUNTER — Encounter (HOSPITAL_COMMUNITY): Payer: Self-pay | Admitting: Emergency Medicine

## 2017-07-29 DIAGNOSIS — M7660 Achilles tendinitis, unspecified leg: Secondary | ICD-10-CM

## 2017-07-29 DIAGNOSIS — Z79899 Other long term (current) drug therapy: Secondary | ICD-10-CM | POA: Diagnosis not present

## 2017-07-29 DIAGNOSIS — M7661 Achilles tendinitis, right leg: Secondary | ICD-10-CM | POA: Insufficient documentation

## 2017-07-29 DIAGNOSIS — M79671 Pain in right foot: Secondary | ICD-10-CM | POA: Diagnosis present

## 2017-07-29 MED ORDER — NAPROXEN 500 MG PO TABS: 500.0000 mg | ORAL_TABLET | Freq: Two times a day (BID) | ORAL | 0 refills | Status: DC

## 2017-07-29 MED ORDER — TRAMADOL HCL 50 MG PO TABS: 50.0000 mg | ORAL_TABLET | Freq: Four times a day (QID) | ORAL | 0 refills | Status: AC | PRN

## 2017-07-29 NOTE — ED Triage Notes (Signed)
Pt repots having right posterior foot pain for month. Had hx multiple surgeries on that foot before. Denies any falls or injuries to cause the pain.

## 2017-07-29 NOTE — Discharge Instructions (Signed)
X-ray does not show any new changes to heel bone or any other bone around achilles tendon.   The best way to diagnose achilles injury is with an ultrasound that we do not do in the ER at these hours.    I recommend you call Dr Sharol Given and attempt to schedule an earlier appointment, perhaps he would recommend ordering an outpatient ultrasound.   Take tramadol and naproxen for pain.  Tylenol 1000 mg every 6-8 hours. Ice. Rest. Elevate. Wear boot until re-evaluated by Dr Sharol Given.

## 2017-07-30 NOTE — ED Provider Notes (Signed)
[facility] DEPT Provider Note   CSN: [id number] Arrival date & time: 07/29/17  1819     History   Chief Complaint Chief Complaint  Patient presents with  . Foot Pain    HPI EMMILYNN [name] is a 52 y.o. female here for evaluation of pain to right achilles x 7 days.  Pain is moderate, gradual in onset but acutely worsening today.  Reports multiple surgeries in right foot and achilles from achilles rupture one year ago.  She is afraid she may have ruptured it again.  Aggravating factors include plantar flexion, walking, palpation.  No alleviating factors. Taking OTC NSAIDs for pain without relief. 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  She is followed by Dr Sharol Given and has an appointment in 2 weeks.  Associated symptoms include local swelling around right ankle and heel.  She denies any direct trauma, numbness tingling or coldness distally. No calf pain or swelling. No h/o DVT/PE.   HPI  Past Medical History:  Diagnosis Date  . Anxiety   . Bipolar affective disorder, mixed, in partial or remission   . Depression   . Dysrhythmia    Left sided heart diease  . Headache(784.0)   . History of gallstones   . Kidney stones   . Mental disorder   . Migraine   . Pneumonia    Hx: of as a child  . PONV (postoperative nausea and vomiting)   . Renal disorder   . Sleep apnea    sleep test 2014, did not have osa  . Substance abuse (HCC)    Fentanyl, Percocet. last use 04/2011    Patient Active Problem List   Diagnosis Date Noted  . Achilles tendon contracture, right 04/18/2016  . OAB (overactive bladder) 02/28/2016  . Urgency incontinence 02/28/2016  . Situational anxiety 06/24/2015  . Mass of arm 04/09/2015  . Deviated septum 11/17/2014  . Gastroesophageal reflux disease without esophagitis 09/02/2014  . Chronic pain syndrome 05/24/2014  . Bereavement 03/13/2014  . Bipolar affective disorder, depressed, severe ([facility])  03/11/2014  . Opiate dependence ([facility]) 03/11/2014  . Intentional opiate overdose ([facility]) 03/11/2014  . De Quervain's tenosynovitis, left 12/12/2013  . Nausea with vomiting 07/28/2013  . Thrombocytopenia, unspecified ([facility]) 07/28/2013  . External hemorrhoids 02/16/2013  . Metatarsalgia, right foot 05/18/2011  . Schizoaffective disorder, bipolar type ([facility]) 03/31/2011    Past Surgical History:  Procedure Laterality Date  . ABDOMINAL HYSTERECTOMY    . 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 BUNIONECTOMY     11/14/2011  . CHOLECYSTECTOMY    . COLONOSCOPY     2001, 2008.  Father has colon cancer  . FOOT SURGERY     right x 2  . NASAL SEPTOPLASTY W/ TURBINOPLASTY Bilateral 12/04/2014   Procedure: NASAL SEPTOPLASTY WITH BILATERAL TURBINATE REDUCTION;  Surgeon: Izora Gala, MD;  Location: [facility];  Service: ENT;  Laterality: Bilateral;  . TOTAL ABDOMINAL HYSTERECTOMY W/ BILATERAL SALPINGOOPHORECTOMY    . VAGINAL HYSTERECTOMY       OB History   None      Home Medications    Prior to Admission medications   Medication Sig Start Date End Date Taking? Authorizing Provider  busPIRone (BUSPAR) 7.5 MG tablet Take 7.5 mg by mouth 2 (two)  times daily. 06/08/17  Yes [provider]  lithium carbonate 300 MG capsule Take 600 mg by mouth at bedtime.  05/12/14  Yes [provider]  omeprazole (PRILOSEC) 40 MG capsule Take 1 capsule (40 mg total) by mouth daily. Take one tab daily 30 minutes before lunch 07/22/17  Yes Milus Banister, MD  QUEtiapine (SEROQUEL) 400 MG tablet Take 2 tablets (800 mg total) by mouth at bedtime. For mood control 03/15/14  Yes Lindell Spar I, NP  traZODone (DESYREL) 100 MG tablet Take 100 mg by mouth at bedtime.   Yes [provider]  zolpidem (AMBIEN) 10 MG  tablet Take 10 mg by mouth at bedtime.   Yes [provider]  albuterol (PROVENTIL HFA;VENTOLIN HFA) 108 (90 Base) MCG/ACT inhaler Inhale 1-2 puffs into the lungs every 6 (six) hours as needed for wheezing or shortness of breath. Patient not taking: Reported on 07/29/2017 03/14/16   Barnet Glasgow, NP  naproxen (NAPROSYN) 500 MG tablet Take 1 tablet (500 mg total) by mouth 2 (two) times daily. 07/29/17   Kinnie Feil, PA-C  traMADol (ULTRAM) 50 MG tablet Take 1 tablet (50 mg total) by mouth every 6 (six) hours as needed for up to 3 days. 07/29/17 08/01/17  Kinnie Feil, PA-C    Family History Family History  Problem Relation Age of Onset  . Colon cancer Father 38  . Breast cancer Father   . Breast cancer Mother   . Uterine cancer Mother   . Stomach cancer Neg Hx   . Rectal cancer Neg Hx   . Esophageal cancer Neg Hx     Social History Social History   Tobacco Use  . Smoking status: Never Smoker  . Smokeless tobacco: Never Used  Substance Use Topics  . Alcohol use: No    Alcohol/week: 0.0 oz  . Drug use: No    Types: Other-see comments, Fentanyl, Hydrocodone     Allergies   Patient has no known allergies.   Review of Systems Review of Systems  Musculoskeletal: Positive for arthralgias and gait problem.  All other systems reviewed and are negative.    Physical Exam Updated Vital Signs BP 107/78 (BP Location: Left Arm)   Pulse 79   Temp 98.2 F (36.8 C) (Oral)   Resp (!) 22   SpO2 95%   Physical Exam  Constitutional: She is oriented to person, place, and time. She appears well-developed and well-nourished.  Non-toxic appearance.  HENT:  Head: Normocephalic.  Right Ear: External ear normal.  Left Ear: External ear normal.  Nose: Nose normal.  Eyes: Conjunctivae and EOM are normal.  Neck: Full passive range of motion without pain.  Cardiovascular: Normal rate.  2+ DP and PT pulses bilaterally. No calf edema or tenderness.     Pulmonary/Chest: Effort normal. No tachypnea. No respiratory distress.  Musculoskeletal: Normal range of motion. She exhibits tenderness.  Right ankle: focal TTP along right achilles and at insertion of calcaneous.  Borderline/slight edema around achilles compared to left. Slightly decreased AROM secondary to pain.  Decreased plantarflexion response with Thompson's compared to left. 5/5 strength with plantar flexion and dorsiflexion. No focal bony tenderness to malleoli, metatarsal, tib/fib.   Neurological: She is alert and oriented to person, place, and time.  Sensation to light touch in feet intact bilaterally  Skin: Skin is warm and dry. Capillary refill takes less than 2 seconds.  Psychiatric: Her behavior is normal. Thought content normal.     ED Treatments / Results  Labs (all labs ordered are listed, but only abnormal results are displayed) Labs Reviewed - No data to display  EKG None  Radiology Dg Ankle Complete Right  Result Date: 07/29/2017 CLINICAL DATA:  Achilles pain. EXAM: RIGHT ANKLE - COMPLETE 3+ VIEW COMPARISON:  03/03/2017 FINDINGS: Broad, indistinct Achilles region shadow, stable from prior. Patient has history of Achilles rupture and repair. First TMT arthrodesis with screws.  No fracture or malalignment. IMPRESSION: 1. No acute osseous finding. 2. Broad and indistinct Achilles shadow that is stable from 03/03/2017. Electronically Signed   By: Monte Fantasia M.D.   On: 07/29/2017 20:49    Procedures Procedures (including critical care time)  Medications Ordered in ED Medications - No data to display   Initial Impression / Assessment and Plan / ED Course  I have reviewed the triage vital signs and the nursing notes.  Pertinent labs & imaging results that were available during my care of the patient were reviewed by me and considered in my medical decision making (see chart for details).     52 yo here for return of pain to right achilles. H/o rupture and  surgical repair.  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  Exam as above concerning for tendonitis vs partial tear. Extremity is NVI. Equal strength with ankle plantar and dorsiflexion bilaterally against resistance however decreased plantarflexion with thompson's test on right affected side.  X-ray obtained to r/o bony spurs or new avulsion type fx. This was negative. Unfortunately no formal US available in ER to evaluate for achilles rupture.  However, given exam, previous h/o rupture will tx as suspect achilles rupture.  Will dc with analgesia, RICE and close f/u with Dr Sharol Given who may be able to order outpatient [place] before appointment.  She has walking boot at home from previous rupture and surgery.  Consider septic arthritis, gout however she has no h/o of same, h/o IVDU or erythema, warmth, fevers.  No calf tenderness or asymmetric edema to raise suspicion for DVT.  Discussed discharge plan with pt who is in agreement. Discussed return precations . Final Clinical Impressions(s) / ED Diagnoses   Final diagnoses:  Achilles tendon pain    ED Discharge Orders        Ordered    naproxen (NAPROSYN) 500 MG tablet  2 times daily     07/29/17 2113    traMADol (ULTRAM) 50 MG tablet  Every 6 hours PRN     07/29/17 2113       Kinnie Feil, PA-C 07/30/17 0144    Nat Christen, MD 08/01/17 [id number]

## 2017-08-05 ENCOUNTER — Encounter: Payer: Self-pay | Admitting: Physician Assistant

## 2017-08-11 ENCOUNTER — Ambulatory Visit (INDEPENDENT_AMBULATORY_CARE_PROVIDER_SITE_OTHER): Payer: Medicare Other | Admitting: Orthopedic Surgery

## 2017-11-10 ENCOUNTER — Encounter (HOSPITAL_COMMUNITY): Payer: Self-pay

## 2017-11-10 DIAGNOSIS — R112 Nausea with vomiting, unspecified: Secondary | ICD-10-CM | POA: Insufficient documentation

## 2017-11-10 DIAGNOSIS — Z79899 Other long term (current) drug therapy: Secondary | ICD-10-CM | POA: Diagnosis not present

## 2017-11-10 NOTE — ED Triage Notes (Signed)
Pt complains of vomiting since 3pm, she states she hasn't eaten anything today, denies diarrhea

## 2017-11-11 ENCOUNTER — Emergency Department (HOSPITAL_COMMUNITY)
Admission: EM | Admit: 2017-11-11 | Discharge: 2017-11-11 | Disposition: A | Discharge status: Home / Self Care | Payer: Medicare Other | Attending: Emergency Medicine | Admitting: Emergency Medicine

## 2017-11-11 ENCOUNTER — Other Ambulatory Visit: Payer: Self-pay

## 2017-11-11 DIAGNOSIS — R112 Nausea with vomiting, unspecified: Secondary | ICD-10-CM

## 2017-11-11 LAB — COMPREHENSIVE METABOLIC PANEL
ALBUMIN: 4.3 g/dL (ref 3.5–5.0)
ALT: 22 U/L (ref 0–44)
AST: 20 U/L (ref 15–41)
Alkaline Phosphatase: 70 U/L (ref 38–126)
Anion gap: 9 (ref 5–15)
BUN: 10 mg/dL (ref 6–20)
CHLORIDE: 108 mmol/L (ref 98–111)
CO2: 26 mmol/L (ref 22–32)
Calcium: 9.8 mg/dL (ref 8.9–10.3)
Creatinine, Ser: 0.89 mg/dL (ref 0.44–1.00)
GFR calc Af Amer: >60 mL/min (ref >60)
GFR calc non Af Amer: >60 mL/min (ref >60)
GLUCOSE: 117 mg/dL — AB (ref 70–99)
POTASSIUM: 3.9 mmol/L (ref 3.5–5.1)
Sodium: 143 mmol/L (ref 135–145)
Total Bilirubin: 1 mg/dL (ref 0.3–1.2)
Total Protein: 7 g/dL (ref 6.5–8.1)

## 2017-11-11 LAB — CBC WITH DIFFERENTIAL/PLATELET
BASOS PCT: 0 %
Basophils Absolute: 0 10*3/uL (ref 0.0–0.1)
EOS PCT: 0 %
Eosinophils Absolute: 0 10*3/uL (ref 0.0–0.7)
HCT: 37.5 % (ref 36.0–46.0)
Hemoglobin: 12.7 g/dL (ref 12.0–15.0)
Lymphocytes Relative: 22 %
Lymphs Abs: 0.9 10*3/uL (ref 0.7–4.0)
MCH: 30.4 pg (ref 26.0–34.0)
MCHC: 33.9 g/dL (ref 30.0–36.0)
MCV: 89.7 fL (ref 78.0–100.0)
Monocytes Absolute: 0.3 10*3/uL (ref 0.1–1.0)
Monocytes Relative: 7 %
NEUTROS ABS: 3.1 10*3/uL (ref 1.7–7.7)
Neutrophils Relative %: 71 %
PLATELETS: 175 10*3/uL (ref 150–400)
RBC: 4.18 MIL/uL (ref 3.87–5.11)
RDW: 12.7 % (ref 11.5–15.5)
WBC: 4.3 10*3/uL (ref 4.0–10.5)

## 2017-11-11 LAB — LIPASE, BLOOD: Lipase: 26 U/L (ref 11–51)

## 2017-11-11 MED ORDER — FAMOTIDINE IN NACL 20-0.9 MG/50ML-% IV SOLN
20.0000 mg | Freq: Once | INTRAVENOUS | Status: AC
  Administered 2017-11-11: 20 mg via INTRAVENOUS
  Filled 2017-11-11: qty 50

## 2017-11-11 MED ORDER — SODIUM CHLORIDE 0.9 % IV BOLUS
1000.0000 mL | Freq: Once | INTRAVENOUS | Status: AC
  Administered 2017-11-11: 1000 mL via INTRAVENOUS

## 2017-11-11 MED ORDER — PROMETHAZINE HCL 25 MG/ML IJ SOLN
12.5000 mg | INTRAMUSCULAR | Status: AC
  Administered 2017-11-11: 12.5 mg via INTRAVENOUS
  Filled 2017-11-11: qty 1

## 2017-11-11 MED ORDER — PROMETHAZINE HCL 25 MG/ML IJ SOLN: 12.5000 mg | Freq: Once | INTRAMUSCULAR | Status: DC | PRN

## 2017-11-11 MED ORDER — PROMETHAZINE HCL 25 MG PO TABS: 25.0000 mg | ORAL_TABLET | Freq: Four times a day (QID) | ORAL | 0 refills | Status: DC | PRN

## 2017-11-11 MED ORDER — METOCLOPRAMIDE HCL 5 MG/ML IJ SOLN
10.0000 mg | Freq: Once | INTRAMUSCULAR | Status: AC
  Administered 2017-11-11: 10 mg via INTRAVENOUS
  Filled 2017-11-11: qty 2

## 2017-11-11 NOTE — Discharge Instructions (Signed)
Avoid fried foods, fatty foods, greasy foods, and milk products until symptoms resolve. Be sure to drink plenty of clear liquids. We recommend the use of Phenergan as prescribed for nausea/vomiting. Follow-up with your primary care doctor to ensure resolution of symptoms.

## 2017-11-11 NOTE — ED Notes (Signed)
IV attempt x 2 and no success.

## 2017-11-11 NOTE — ED Provider Notes (Signed)
[facility] DEPT Provider Note   CSN: [id number] Arrival date & time: 11/10/17  2329    History   Chief Complaint Chief Complaint  Patient presents with  . Emesis    HPI [name] is a 52 y.o. female.   52 year old female presents to the emergency department for evaluation of vomiting.  Symptoms began at 1500 today.  She reports experiencing around 13 episodes of vomiting in the past 12 hours.  She has been unable to tolerate any food or fluids by mouth.  Last bowel movement was yesterday.  She has been passing flatus without difficulty.  Denies any diarrhea, melena, hematochezia.  No associated urinary symptoms or fever.  She believes that onset of her vomiting may be due to use of Percocet.  She takes this medication chronically, but states that it causes her these side effects sporadically.  Abdominal surgical history significant for cholecystectomy, TAH/BSO.     Past Medical History:  Diagnosis Date  . Anxiety   . Bipolar affective disorder, mixed, in partial or remission   . Depression   . Dysrhythmia    Left sided heart diease  . Headache(784.0)   . History of gallstones   . Kidney stones   . Mental disorder   . Migraine   . Pneumonia    Hx: of as a child  . PONV (postoperative nausea and vomiting)   . Renal disorder   . Sleep apnea    sleep test 2014, did not have osa  . Substance abuse (HCC)    Fentanyl, Percocet. last use 04/2011    Patient Active Problem List   Diagnosis Date Noted  . Achilles tendon contracture, right 04/18/2016  . OAB (overactive bladder) 02/28/2016  . Urgency incontinence 02/28/2016  . Situational anxiety 06/24/2015  . Mass of arm 04/09/2015  . Deviated septum 11/17/2014  . Gastroesophageal reflux disease without esophagitis 09/02/2014  . Chronic pain syndrome 05/24/2014  . Bereavement 03/13/2014  . Bipolar affective disorder, depressed, severe ([place]) 03/11/2014  . Opiate dependence ([place])  03/11/2014  . Intentional opiate overdose ([place]) 03/11/2014  . De Quervain's tenosynovitis, left 12/12/2013  . Nausea with vomiting 07/28/2013  . Thrombocytopenia, unspecified ([place]) 07/28/2013  . External hemorrhoids 02/16/2013  . Metatarsalgia, right foot 05/18/2011  . Schizoaffective disorder, bipolar type ([place]) 03/31/2011    Past Surgical History:  Procedure Laterality Date  . ABDOMINAL HYSTERECTOMY    . AMPUTATION Right 09/15/2012   Procedure: AMPUTATION DIGIT- right ;  Surgeon: Newt Minion, MD;  Location: [facility];  Service: Orthopedics;  Laterality: Right;  Right Great Toe Amputation at MTP (metatarsophalangeal)  . BUNIONECTOMY     11/14/2011  . CHOLECYSTECTOMY    . COLONOSCOPY     2001, 2008.  Father has colon cancer  . FOOT SURGERY     right x 2  . NASAL SEPTOPLASTY W/ TURBINOPLASTY Bilateral 12/04/2014   Procedure: NASAL SEPTOPLASTY WITH BILATERAL TURBINATE REDUCTION;  Surgeon: Izora Gala, MD;  Location: [facility];  Service: ENT;  Laterality: Bilateral;  . TOTAL ABDOMINAL HYSTERECTOMY W/ BILATERAL SALPINGOOPHORECTOMY    . VAGINAL HYSTERECTOMY       OB History   None      Home Medications    Prior to Admission medications   Medication Sig Start Date End Date Taking? Authorizing Provider  albuterol (PROVENTIL HFA;VENTOLIN HFA) 108 (90 Base) MCG/ACT inhaler Inhale 1-2 puffs into the lungs every 6 (six) hours as needed for wheezing or shortness of breath.  Patient not taking: Reported on 07/29/2017 03/14/16   Barnet Glasgow, NP  busPIRone (BUSPAR) 7.5 MG tablet Take 7.5 mg by mouth 2 (two) times daily. 06/08/17   [provider]  lithium carbonate 300 MG capsule Take 600 mg by mouth at bedtime.  05/12/14   [provider]  naproxen (NAPROSYN) 500 MG tablet Take 1 tablet (500 mg total) by mouth 2 (two) times daily. 07/29/17   Kinnie Feil, PA-C  omeprazole (PRILOSEC) 40 MG capsule Take 1 capsule (40 mg total) by mouth daily. Take one tab  daily 30 minutes before lunch 07/22/17   Milus Banister, MD  promethazine (PHENERGAN) 25 MG tablet Take 1 tablet (25 mg total) by mouth every 6 (six) hours as needed for nausea or vomiting. 11/11/17   Antonietta Breach, PA-C  QUEtiapine (SEROQUEL) 400 MG tablet Take 2 tablets (800 mg total) by mouth at bedtime. For mood control 03/15/14   Lindell Spar I, NP  traZODone (DESYREL) 100 MG tablet Take 100 mg by mouth at bedtime.    [provider]  zolpidem (AMBIEN) 10 MG tablet Take 10 mg by mouth at bedtime.    [provider]    Family History Family History  Problem Relation Age of Onset  . Colon cancer Father 68  . Breast cancer Father   . Breast cancer Mother   . Uterine cancer Mother   . Stomach cancer Neg Hx   . Rectal cancer Neg Hx   . Esophageal cancer Neg Hx     Social History Social History   Tobacco Use  . Smoking status: Never Smoker  . Smokeless tobacco: Never Used  Substance Use Topics  . Alcohol use: No    Alcohol/week: 0.0 standard drinks  . Drug use: No    Types: Other-see comments, Fentanyl, Hydrocodone     Allergies   Patient has no known allergies.   Review of Systems Review of Systems Ten systems reviewed and are negative for acute change, except as noted in the HPI.    Physical Exam Updated Vital Signs BP (!) 142/93 (BP Location: Left Arm)   Pulse 88   Temp 98 F (36.7 C)   Resp 15   Ht 5\' 6"  (1.676 m)   Wt 99.8 kg   SpO2 98%   BMI 35.51 kg/m   Physical Exam  Constitutional: She is oriented to person, place, and time. She appears well-developed and well-nourished. No distress.  Nontoxic appearing and in NAD  HENT:  Head: Normocephalic and atraumatic.  Eyes: Conjunctivae and EOM are normal. No scleral icterus.  Neck: Normal range of motion.  Cardiovascular: Normal rate, regular rhythm and intact distal pulses.  Pulmonary/Chest: Effort normal. No stridor. No respiratory distress. She has no wheezes.  Respirations even and  unlabored.  Abdominal: Soft. She exhibits no distension and no mass. There is no tenderness. There is no guarding.  Soft, obese, nontender abdomen.  Musculoskeletal: Normal range of motion.  Neurological: She is alert and oriented to person, place, and time. She exhibits normal muscle tone. Coordination normal.  Skin: Skin is warm and dry. No rash noted. She is not diaphoretic. No erythema. No pallor.  Psychiatric: She has a normal mood and affect. Her behavior is normal.  Nursing note and vitals reviewed.    ED Treatments / Results  Labs (all labs ordered are listed, but only abnormal results are displayed) Labs Reviewed  COMPREHENSIVE METABOLIC PANEL - Abnormal; Notable for the following components:  Result Value   Glucose, Bld 117 (*)    All other components within normal limits  CBC WITH DIFFERENTIAL/PLATELET  LIPASE, BLOOD    EKG None  Radiology No results found.  Procedures Procedures (including critical care time)  Medications Ordered in ED Medications  sodium chloride 0.9 % bolus 1,000 mL (0 mLs Intravenous Stopped 11/11/17 0451)  metoCLOPramide (REGLAN) injection 10 mg (10 mg Intravenous Given 11/11/17 0218)  famotidine (PEPCID) IVPB 20 mg premix (0 mg Intravenous Stopped 11/11/17 0321)  promethazine (PHENERGAN) injection 12.5 mg (12.5 mg Intravenous Given 11/11/17 0331)  promethazine (PHENERGAN) injection 12.5 mg (12.5 mg Intravenous Given 11/11/17 0512)    3:21 AM Patient reassessed.  Continues to complain of nausea.  She has not had any persistent vomiting.  Will give Phenergan.  Laboratory evaluation reassuring.   Initial Impression / Assessment and Plan / ED Course  I have reviewed the triage vital signs and the nursing notes.  Pertinent labs & imaging results that were available during my care of the patient were reviewed by me and considered in my medical decision making (see chart for details).     52 year old female presents for nausea and vomiting  with the absence of abdominal pain.  She had a bowel movement yesterday.  Has continued to pass flatus.  Laboratory work-up is reassuring with no leukocytosis or electrolyte derangements.  Liver and kidney function preserved.  Lipase normal.  The patient reports similar episodes of nausea and vomiting which, she suspects, is precipitated by use of Percocet.  She was followed by pain management in the past.  Symptoms have been well controlled following IV fluids and IV antiemetics.  The patient has not had any vomiting since arrival in the emergency department.  On repeat assessment, exam is stable.  She states that she is feeling better.  Tolerating oral fluids without issue.  Patient expresses comfort with discharge.  Will provide short course of oral Phenergan for home use.  Primary care follow-up encouraged.  Return precautions discussed and provided. Patient discharged in stable condition with no unaddressed concerns.   Final Clinical Impressions(s) / ED Diagnoses   Final diagnoses:  Nausea and vomiting, intractability of vomiting not specified, unspecified vomiting type    ED Discharge Orders         Ordered    promethazine (PHENERGAN) 25 MG tablet  Every 6 hours PRN     11/11/17 0506           Antonietta Breach, PA-C 11/11/17 0536    Molpus, Jenny Reichmann, MD 11/11/17 [phone]

## 2017-11-11 NOTE — ED Notes (Signed)
Patient is nauseas and has been vomiting since 3 today. Patient has not had any relief with anything she has tried.

## 2017-11-11 NOTE — ED Notes (Signed)
Patient is currently drinking water. Patient has not vomited or gagged since drinking the water.

## 2017-12-30 ENCOUNTER — Ambulatory Visit: Payer: Self-pay | Admitting: Psychiatry

## 2018-02-25 ENCOUNTER — Ambulatory Visit: Payer: Self-pay | Admitting: Psychiatry

## 2018-02-26 ENCOUNTER — Encounter: Payer: Self-pay | Admitting: Physician Assistant

## 2018-03-02 ENCOUNTER — Ambulatory Visit: Payer: Medicare Other | Admitting: Psychiatry

## 2018-03-02 DIAGNOSIS — F250 Schizoaffective disorder, bipolar type: Secondary | ICD-10-CM | POA: Diagnosis not present

## 2018-03-02 DIAGNOSIS — F418 Other specified anxiety disorders: Secondary | ICD-10-CM

## 2018-03-02 DIAGNOSIS — F314 Bipolar disorder, current episode depressed, severe, without psychotic features: Secondary | ICD-10-CM | POA: Diagnosis not present

## 2018-03-02 MED ORDER — CARIPRAZINE HCL 6 MG PO CAPS: 6.0000 mg | ORAL_CAPSULE | Freq: Every day | ORAL | 2 refills | Status: DC

## 2018-03-02 MED ORDER — TRAZODONE HCL 100 MG PO TABS: 200.0000 mg | ORAL_TABLET | Freq: Every day | ORAL | 2 refills | Status: DC

## 2018-03-02 MED ORDER — BUSPIRONE HCL 7.5 MG PO TABS: 7.5000 mg | ORAL_TABLET | Freq: Two times a day (BID) | ORAL | 2 refills | Status: DC

## 2018-03-02 MED ORDER — ZOLPIDEM TARTRATE 10 MG PO TABS: 10.0000 mg | ORAL_TABLET | Freq: Every day | ORAL | 2 refills | Status: DC

## 2018-03-02 MED ORDER — LITHIUM CARBONATE 300 MG PO CAPS: 600.0000 mg | ORAL_CAPSULE | Freq: Every day | ORAL | 2 refills | Status: DC

## 2018-03-02 MED ORDER — QUETIAPINE FUMARATE 400 MG PO TABS
800.0000 mg | ORAL_TABLET | Freq: Every day | ORAL | 2 refills | Status: DC
Start: 2018-03-02 — End: 2018-05-19

## 2018-03-02 NOTE — Progress Notes (Signed)
[facility] Med Check  Patient ID: [name],  MRN: [id number]  PCP: Brunetta Jeans, PA-C  Date of Evaluation: 03/02/2018 Time spent:20 minutes  Chief Complaint:   HISTORY/CURRENT STATUS: HPI patient seen in September of this year.  At the time she had an increased depression and an increase in anxiety.  I increased her BuSpar to 30 mg twice daily. Diagnoses include bipolar disorder, history of schizoaffective disorder, anxiety, history of drug use with medications. Currently her anxiety is better.  No psychosis.  She went down on the BuSpar to 7.5 twice daily instead of going up to 15 twice daily.  Individual Medical History/ Review of Systems: Changes? :No   Allergies: Patient has no known allergies.  Current Medications:  Current Outpatient Medications:  .  busPIRone (BUSPAR) 7.5 MG tablet, Take 1 tablet (7.5 mg total) by mouth 2 (two) times daily., Disp: 60 tablet, Rfl: 2 .  lithium carbonate 300 MG capsule, Take 2 capsules (600 mg total) by mouth at bedtime., Disp: 60 capsule, Rfl: 2 .  QUEtiapine (SEROQUEL) 400 MG tablet, Take 2 tablets (800 mg total) by mouth at bedtime. For mood control, Disp: 60 tablet, Rfl: 2 .  traZODone (DESYREL) 100 MG tablet, Take 2 tablets (200 mg total) by mouth at bedtime., Disp: 60 tablet, Rfl: 2 .  zolpidem (AMBIEN) 10 MG tablet, Take 1 tablet (10 mg total) by mouth at bedtime., Disp: 30 tablet, Rfl: 2 .  albuterol (PROVENTIL HFA;VENTOLIN HFA) 108 (90 Base) MCG/ACT inhaler, Inhale 1-2 puffs into the lungs every 6 (six) hours as needed for wheezing or shortness of breath. (Patient not taking: Reported on 07/29/2017), Disp: 1 Inhaler, Rfl: 0 .  Cariprazine HCl (VRAYLAR) 6 MG CAPS, Take 1 capsule (6 mg total) by mouth daily., Disp: 30 capsule, Rfl: 2 .  gabapentin (NEURONTIN) 300 MG capsule, Take by mouth., Disp: , Rfl:  .  naproxen (NAPROSYN) 500 MG tablet, Take 1 tablet (500 mg total) by mouth 2 (two) times daily., Disp: 30 tablet, Rfl: 0 .   omeprazole (PRILOSEC) 40 MG capsule, Take 1 capsule (40 mg total) by mouth daily. Take one tab daily 30 minutes before lunch, Disp: 30 capsule, Rfl: 11 .  promethazine (PHENERGAN) 25 MG tablet, Take 1 tablet (25 mg total) by mouth every 6 (six) hours as needed for nausea or vomiting., Disp: 12 tablet, Rfl: 0 Medication Side Effects: none  Family Medical/ Social History: Changes? No  MENTAL HEALTH EXAM:  There were no vitals taken for this visit.There is no height or weight on file to calculate BMI.  General Appearance: Casual  Eye Contact:  Good  Speech:  Normal Rate  Volume:  Normal  Mood:  Euthymic  Affect:  Appropriate  Thought Process:  Linear  Orientation:  Full (Time, Place, and Person)  Thought Content: Logical   Suicidal Thoughts:  No  Homicidal Thoughts:  No  Memory:  WNL  Judgement:  Good  Insight:  Good  Psychomotor Activity:  Normal  Concentration:  Concentration: Good  Recall:  Good  Fund of Knowledge: Good  Language: Good  Assets:  Desire for Improvement  ADL's:  Intact  Cognition: WNL  Prognosis:  Good    DIAGNOSES:    ICD-10-CM   1. Bipolar affective disorder, depressed, severe (HCC) F31.4 Lithium level    Lipid panel    Hemoglobin J1P    Basic metabolic panel  2. Schizoaffective disorder, bipolar type ([facility]) F25.0   3. Situational anxiety F41.8  Receiving Psychotherapy: No    RECOMMENDATIONS: Patient wishes to stay on the same medication.  I will get her lipid level, hemoglobin A1c, lithium level, BMP.   Comer Locket, PA-C

## 2018-03-05 ENCOUNTER — Other Ambulatory Visit: Payer: Self-pay | Admitting: Otolaryngology

## 2018-03-05 DIAGNOSIS — E042 Nontoxic multinodular goiter: Secondary | ICD-10-CM

## 2018-03-11 ENCOUNTER — Ambulatory Visit
Admission: RE | Admit: 2018-03-11 | Discharge: 2018-03-11 | Disposition: A | Discharge status: Home / Self Care | Payer: Medicare Other | Source: Ambulatory Visit | Attending: Otolaryngology | Admitting: Otolaryngology

## 2018-03-11 ENCOUNTER — Other Ambulatory Visit: Payer: Self-pay | Admitting: Otolaryngology

## 2018-03-11 DIAGNOSIS — E042 Nontoxic multinodular goiter: Secondary | ICD-10-CM

## 2018-03-18 ENCOUNTER — Encounter (HOSPITAL_BASED_OUTPATIENT_CLINIC_OR_DEPARTMENT_OTHER): Payer: Self-pay

## 2018-03-18 DIAGNOSIS — R0683 Snoring: Secondary | ICD-10-CM

## 2018-03-29 LAB — HEPATIC FUNCTION PANEL
ALT: 26 (ref 7–35)
AST: 32 (ref 13–35)
Alkaline Phosphatase: 71 (ref 25–125)
Bilirubin, Total: 0.5

## 2018-03-29 LAB — CBC AND DIFFERENTIAL
HCT: 38 (ref 36–46)
Hemoglobin: 13 (ref 12.0–16.0)
Platelets: 158 (ref 150–399)
WBC: 3.8

## 2018-03-29 LAB — TSH: TSH: 1.13 (ref <=5.90)

## 2018-03-29 LAB — BASIC METABOLIC PANEL
BUN: 12 (ref 4–21)
Creatinine: 0.9 (ref <=1.1)
Glucose: 77
Potassium: 4 (ref 3.4–5.3)
Sodium: 139 (ref 137–147)

## 2018-03-29 LAB — LIPID PANEL
Cholesterol: 211 — AB (ref 0–200)
HDL: 54 (ref 35–70)
LDL CALC: 142
Triglycerides: 111 (ref 40–160)

## 2018-03-29 LAB — HEMOGLOBIN A1C: Hemoglobin A1C: 4.8

## 2018-03-31 ENCOUNTER — Encounter: Payer: Self-pay | Admitting: Physician Assistant

## 2018-03-31 ENCOUNTER — Other Ambulatory Visit: Payer: Self-pay

## 2018-03-31 ENCOUNTER — Ambulatory Visit (INDEPENDENT_AMBULATORY_CARE_PROVIDER_SITE_OTHER): Payer: Medicare Other | Admitting: Physician Assistant

## 2018-03-31 VITALS — BP 110/70 | HR 76 | Temp 98.2°F | Resp 16 | Ht 66.0 in | Wt 221.0 lb

## 2018-03-31 DIAGNOSIS — B350 Tinea barbae and tinea capitis: Secondary | ICD-10-CM | POA: Diagnosis not present

## 2018-03-31 MED ORDER — TERBINAFINE HCL 250 MG PO TABS: 250.0000 mg | ORAL_TABLET | Freq: Every day | ORAL | 0 refills | Status: DC

## 2018-03-31 NOTE — Patient Instructions (Signed)
Start the Terbinafine once daily for 2 weeks. We will likely extend to 4 weeks but I want to make sure it is working well for you. If no improvement, we would not want to continue medication. Please call me in 2 weeks to let me know how things are going.  You will be contacted to schedule appointment for mammogram.   Follow-up with Endo, Psych and other specialists as scheduled.

## 2018-03-31 NOTE — Progress Notes (Signed)
Patient presents to clinic today c/o three bald patches on the back of the head- became more noticeable 2-3 weeks ago and progressively worsened. Pt states that scalp is tender to touch (throbbing pain) which corresponds to development of patches. Notes itching and flaking in the area. She denies changes to hair products or treatment. Has history of tinea capitis and notes this feels similar.  Past Medical History:  Diagnosis Date  . Anxiety   . Bipolar affective disorder, mixed, in partial or remission   . Depression   . Dysrhythmia    Left sided heart diease  . Headache(784.0)   . History of gallstones   . Kidney stones   . Mental disorder   . Migraine   . Pneumonia    Hx: of as a child  . PONV (postoperative nausea and vomiting)   . Renal disorder   . Sleep apnea    sleep test 2014, did not have osa  . 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 albuterol (PROVENTIL HFA;VENTOLIN HFA) 108 (90 Base) MCG/ACT inhaler Inhale 1-2 puffs into the lungs every 6 (six) hours as needed for wheezing or shortness of breath. 1 Inhaler 0  . busPIRone (BUSPAR) 7.5 MG tablet Take 1 tablet (7.5 mg total) by mouth 2 (two) times daily. 60 tablet 2  . gabapentin (NEURONTIN) 300 MG capsule Take by mouth.    . lithium carbonate 300 MG capsule Take 2 capsules (600 mg total) by mouth at bedtime. 60 capsule 2  . QUEtiapine (SEROQUEL) 400 MG tablet Take 2 tablets (800 mg total) by mouth at bedtime. For mood control 60 tablet 2  . traMADol (ULTRAM) 50 MG tablet TK 1 T PO Q 6 H PRN P    . traZODone (DESYREL) 100 MG tablet Take 2 tablets (200 mg total) by mouth at bedtime. 60 tablet 2  . zolpidem (AMBIEN) 10 MG tablet Take 1 tablet (10 mg total) by mouth at bedtime. 30 tablet 2  . Cariprazine HCl (VRAYLAR) 6 MG CAPS Take 1 capsule (6 mg total) by mouth daily. (Patient not taking: Reported on  03/31/2018) 30 capsule 2   No current facility-administered medications on file prior to visit.     No Known Allergies  Family History  Problem Relation Age of Onset  . Colon cancer Father 42  . Breast cancer Father   . Breast cancer Mother   . Uterine cancer Mother   . Stomach cancer Neg Hx   . Rectal cancer Neg Hx   . Esophageal cancer Neg Hx     Social History   Socioeconomic History  . Marital status: Married    Spouse name: Not on file  . Number of children: 6  . Years of education: Not on file  . Highest education level: Not on file  Occupational History  . Occupation: Presenter, broadcasting  Social Needs  . Financial resource strain: Not on file  . Food insecurity:    Worry: Not on file    Inability: Not on file  . Transportation needs:    Medical: Not on file    Non-medical: Not on file  Tobacco Use  . Smoking status: Never Smoker  . Smokeless tobacco: Never Used  Substance and Sexual Activity  . Alcohol use: No    Alcohol/week: 0.0 standard drinks  . Drug use: No    Types: Netta Corrigan  comments, Fentanyl, Hydrocodone  . Sexual activity: Not on file  Lifestyle  . Physical activity:    Days per week: Not on file    Minutes per session: Not on file  . Stress: Not on file  Relationships  . Social connections:    Talks on phone: Not on file    Gets together: Not on file    Attends religious service: Not on file    Active member of club or organization: Not on file    Attends meetings of clubs or organizations: Not on file    Relationship status: Not on file  Other Topics Concern  . 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  All other ROS are negative.  There were  no vitals taken for this visit.  Physical Exam HENT:     Head: Hair is abnormal.      Comments: Pt has three areas of hair loss with clearing and dry flaky skin. Areas around hair loss appear dry and flaky as well.  Skin:    General: Skin is dry.     Findings: Rash present. Rash is scaling.           Assessment/Plan: 1. Tinea capitis Start Terbinafine daily. Will start with shorter course of medication to make sure she tolerates and is helping symptoms. If so will increase to full 4 weeks of medication. Recent LFTS within normal limits. Supportive measures reviewed with patient. Follow-up discussed.   Leeanne Rio, PA-C

## 2018-04-09 ENCOUNTER — Telehealth: Payer: Self-pay | Admitting: Psychiatry

## 2018-04-09 NOTE — Telephone Encounter (Signed)
Call from Hinton Dyer, counselor at wt loss clinic to check on patient's current stability. Pt told Hinton Dyer she was manic. Also jokes aabout Si-hanging, and Hi -at work. Pt bipolar, schizoaffective d/o. Hx of 8 hosp. For drug OD. Once with Si. Opioid and bzd use. Currently getting controlled substance from multiple providers. I have attempted to call pt as she called earlier. Unable to leave a msg.  At next phone conversation will check with Evoleht about mood  And if any Si or Hi.  I will continue to reach patient to talk with her and then I'll call dana back. Hold on wt. Loss agents. Should be ok for dietary changes. Above discussed with Dr. Clovis Pu.

## 2018-04-14 ENCOUNTER — Telehealth: Payer: Self-pay | Admitting: Psychiatry

## 2018-04-14 NOTE — Telephone Encounter (Signed)
See prior note from weight loss clinic Pt told weight loss personnel Hinton Dyer)  That she was manic and joked about Si and Hi. I have not been able to reach her. Left msg today

## 2018-04-26 ENCOUNTER — Encounter (HOSPITAL_COMMUNITY): Payer: Self-pay

## 2018-04-26 ENCOUNTER — Ambulatory Visit (HOSPITAL_BASED_OUTPATIENT_CLINIC_OR_DEPARTMENT_OTHER): Payer: Medicare Other

## 2018-04-26 ENCOUNTER — Emergency Department (HOSPITAL_COMMUNITY)
Admission: EM | Admit: 2018-04-26 | Discharge: 2018-04-27 | Disposition: A | Discharge status: Home / Self Care | Payer: Medicare Other | Attending: Emergency Medicine | Admitting: Emergency Medicine

## 2018-04-26 DIAGNOSIS — B350 Tinea barbae and tinea capitis: Secondary | ICD-10-CM | POA: Insufficient documentation

## 2018-04-26 DIAGNOSIS — Z79899 Other long term (current) drug therapy: Secondary | ICD-10-CM | POA: Insufficient documentation

## 2018-04-26 DIAGNOSIS — R21 Rash and other nonspecific skin eruption: Secondary | ICD-10-CM | POA: Diagnosis present

## 2018-04-26 NOTE — ED Triage Notes (Signed)
Pt states she was treating a bald spot on her head with lamisil and now its spread across her head and she has a lump at the base of her head, she states it's very painful

## 2018-04-26 NOTE — ED Provider Notes (Signed)
[facility] DEPT Provider Note  CSN: [id number] Arrival date & time: 04/26/18 2023  Chief Complaint(s) No chief complaint on file.  HPI [name] is a 53 y.o. female past medical history listed below including tinea capitis who was recently treated with 2 weeks of oral terbinafine, presents to the emergency department with persistent fungal infection and lump.  She reports that her fungal infection has spread.  She is endorsing few small occipital and nuchal bumps that are tender to palpation.  She denies any associated fevers or chills.  No headache.  Denies any immunosuppression.  No nuchal rigidity.  Denies any other physical complaints.  HPI  Past Medical History Past Medical History:  Diagnosis Date  . Anxiety   . Bipolar affective disorder, mixed, in partial or remission   . Depression   . Dysrhythmia    Left sided heart diease  . Headache(784.0)   . History of gallstones   . Kidney stones   . Mental disorder   . Migraine   . Pneumonia    Hx: of as a child  . PONV (postoperative nausea and vomiting)   . Renal disorder   . Sleep apnea    sleep test 2014, did not have osa  . Substance abuse (HCC)    Fentanyl, Percocet. last use 04/2011   Patient Active Problem List   Diagnosis Date Noted  . Achilles tendon contracture, right 04/18/2016  . OAB (overactive bladder) 02/28/2016  . Urgency incontinence 02/28/2016  . Situational anxiety 06/24/2015  . Mass of arm 04/09/2015  . Deviated septum 11/17/2014  . Gastroesophageal reflux disease without esophagitis 09/02/2014  . Chronic pain syndrome 05/24/2014  . Bereavement 03/13/2014  . Bipolar affective disorder, depressed, severe ([place]) 03/11/2014  . Opiate dependence ([place]) 03/11/2014  . Intentional opiate overdose ([place]) 03/11/2014  . De Quervain's tenosynovitis, left 12/12/2013  . Nausea with vomiting 07/28/2013  . Thrombocytopenia, unspecified ([place]) 07/28/2013  . External hemorrhoids  02/16/2013  . Metatarsalgia, right foot 05/18/2011  . Schizoaffective disorder, bipolar type ([place]) 03/31/2011   Home Medication(s) Prior to Admission medications   Medication Sig Start Date End Date Taking? Authorizing Provider  albuterol (PROVENTIL HFA;VENTOLIN HFA) 108 (90 Base) MCG/ACT inhaler Inhale 1-2 puffs into the lungs every 6 (six) hours as needed for wheezing or shortness of breath. 03/14/16   Barnet Glasgow, NP  busPIRone (BUSPAR) 7.5 MG tablet Take 1 tablet (7.5 mg total) by mouth 2 (two) times daily. 03/02/18   Shugart, Lissa Hoard, PA-C  Cariprazine HCl (VRAYLAR) 6 MG CAPS Take 1 capsule (6 mg total) by mouth daily. Patient not taking: Reported on 03/31/2018 03/02/18   Comer Locket, PA-C  gabapentin (NEURONTIN) 300 MG capsule Take by mouth. 09/11/17 09/11/18  [provider]  ketoconazole (NIZORAL) 2 % shampoo Apply 1 application topically 2 (two) times a week. 04/29/18   Fatima Blank, MD  lithium carbonate 300 MG capsule Take 2 capsules (600 mg total) by mouth at bedtime. 03/02/18   Shugart, Lissa Hoard, PA-C  QUEtiapine (SEROQUEL) 400 MG tablet Take 2 tablets (800 mg total) by mouth at bedtime. For mood control 03/02/18   Shugart, Lissa Hoard, PA-C  terbinafine (LAMISIL) 250 MG tablet Take 1 tablet (250 mg total) by mouth daily. 03/31/18   Brunetta Jeans, PA-C  traMADol (ULTRAM) 50 MG tablet TK 1 T PO Q 6 H PRN P 02/16/18   [provider]  traZODone (DESYREL) 100 MG tablet Take 2 tablets (200 mg total) by mouth at bedtime.  03/02/18   Shugart, Lissa Hoard, PA-C  zolpidem (AMBIEN) 10 MG tablet Take 1 tablet (10 mg total) by mouth at bedtime. 03/02/18   Comer Locket, PA-C                                                                                                                                    Past Surgical History Past Surgical History:  Procedure Laterality Date  . ABDOMINAL HYSTERECTOMY    . 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 BUNIONECTOMY     11/14/2011  . CHOLECYSTECTOMY    . COLONOSCOPY     2001, 2008.  Father has colon cancer  . FOOT SURGERY     right x 2  . NASAL SEPTOPLASTY W/ TURBINOPLASTY Bilateral 12/04/2014   Procedure: NASAL SEPTOPLASTY WITH BILATERAL TURBINATE REDUCTION;  Surgeon: Izora Gala, MD;  Location: [facility];  Service: ENT;  Laterality: Bilateral;  . TOTAL ABDOMINAL HYSTERECTOMY W/ BILATERAL SALPINGOOPHORECTOMY    . VAGINAL HYSTERECTOMY     Family History Family History  Problem Relation Age of Onset  . Colon cancer Father 60  . Breast cancer Father   . Breast cancer Mother   . Uterine cancer Mother   . Stomach cancer Neg Hx   . Rectal cancer Neg Hx   . Esophageal cancer Neg Hx     Social History Social History   Tobacco Use  . Smoking status: Never Smoker  . Smokeless tobacco: Never Used  Substance Use Topics  . Alcohol use: No    Alcohol/week: 0.0 standard drinks  . Drug use: No    Types: Other-see comments, Fentanyl, Hydrocodone   Allergies Patient has no known allergies.  Review of Systems Review of Systems As noted in HPI  Physical Exam Vital Signs  I have reviewed the triage vital signs BP (!) 144/92   Pulse 92   Temp 97.8 F (36.6 C) (Oral)   Resp 16   SpO2 97%   Physical Exam Vitals signs reviewed.  Constitutional:      General: She is not in acute distress.    Appearance: She is well-developed. She is not diaphoretic.  HENT:     Head: Normocephalic and atraumatic. Hair is abnormal.      Right Ear: External ear normal.     Left Ear: External ear normal.     Nose: Nose normal.  Eyes:     General: No scleral icterus.    Conjunctiva/sclera: Conjunctivae normal.  Neck:     Musculoskeletal: Normal range of motion.     Trachea: Phonation normal.  Cardiovascular:     Rate and Rhythm: Normal  rate and regular rhythm.  Pulmonary:     Effort: Pulmonary effort is normal. No respiratory distress.  Breath sounds: No stridor.  Abdominal:     General: There is no distension.  Musculoskeletal: Normal range of motion.  Lymphadenopathy:     Head:     Right side of head: Occipital (small shotty LAD) adenopathy present. No posterior auricular adenopathy.     Left side of head: No posterior auricular or occipital adenopathy.     Cervical: No cervical adenopathy.  Neurological:     Mental Status: She is alert and oriented to person, place, and time.  Psychiatric:        Behavior: Behavior normal.     ED Results and Treatments Labs (all labs ordered are listed, but only abnormal results are displayed) Labs Reviewed - No data to display                                                                                                                       EKG  EKG Interpretation  Date/Time:    Ventricular Rate:    PR Interval:    QRS Duration:   QT Interval:    QTC Calculation:   R Axis:     Text Interpretation:        Radiology No results found. Pertinent labs & imaging results that were available during my care of the patient were reviewed by me and considered in my medical decision making (see chart for details).  Medications Ordered in ED Medications - No data to display                                                                                                                                  Procedures Procedures  (including critical care time)  Medical Decision Making / ED Course I have reviewed the nursing notes for this encounter and the patient's prior records (if available in EHR or on provided paperwork).    Tinea capitis with shotty LAD. No superimposed infection notable.  Will provide patient with antifungal shampoo and recommend close follow-up with her PCP/Derm.  The patient appears reasonably screened and/or stabilized for discharge and I doubt  any other medical condition or other [facility] requiring further screening, evaluation, or treatment in the ED at this time prior to discharge.  The patient is safe for discharge with strict return precautions.  Final Clinical Impression(s) / ED Diagnoses Final diagnoses:  Tinea capitis   Disposition: Discharge  Condition: Good  I have discussed the results, Dx and Tx  plan with the patient who expressed understanding and agree(s) with the plan. Discharge instructions discussed at great length. The patient was given strict return precautions who verbalized understanding of the instructions. No further questions at time of discharge.    ED Discharge Orders         Ordered    ketoconazole (NIZORAL) 2 % shampoo  2 times weekly     04/27/18 0004           Follow Up: Brunetta Jeans, PA-C [address] [place] [address] [phone]  Schedule an appointment as soon as possible for a visit    Dermatology  Schedule an appointment as soon as possible for a visit        This chart was dictated using voice recognition software.  Despite best efforts to proofread,  errors can occur which can change the documentation meaning.   Fatima Blank, MD 04/27/18 0006

## 2018-04-27 MED ORDER — KETOCONAZOLE 2 % EX SHAM: 1.0000 "application " | MEDICATED_SHAMPOO | CUTANEOUS | 0 refills | Status: DC

## 2018-04-30 ENCOUNTER — Telehealth: Payer: Self-pay | Admitting: Psychiatry

## 2018-04-30 NOTE — Telephone Encounter (Signed)
Talk with patient by telephone she denies suicidal or homicidal thoughts.  States she was manic several months back but she is okay now.  Juliane Poot a psychologist at [facility] I called and left a message on her machine. At her next visit consider lipids hemoglobin A1c and a BMP.

## 2018-05-11 ENCOUNTER — Emergency Department (HOSPITAL_COMMUNITY)
Admission: EM | Admit: 2018-05-11 | Discharge: 2018-05-12 | Disposition: A | Discharge status: Home / Self Care | Payer: Medicare Other | Attending: Emergency Medicine | Admitting: Emergency Medicine

## 2018-05-11 ENCOUNTER — Encounter (HOSPITAL_COMMUNITY): Payer: Self-pay | Admitting: Emergency Medicine

## 2018-05-11 ENCOUNTER — Emergency Department (HOSPITAL_COMMUNITY): Payer: Medicare Other

## 2018-05-11 ENCOUNTER — Other Ambulatory Visit: Payer: Self-pay

## 2018-05-11 DIAGNOSIS — Z79899 Other long term (current) drug therapy: Secondary | ICD-10-CM | POA: Diagnosis not present

## 2018-05-11 DIAGNOSIS — Y929 Unspecified place or not applicable: Secondary | ICD-10-CM | POA: Insufficient documentation

## 2018-05-11 DIAGNOSIS — Y939 Activity, unspecified: Secondary | ICD-10-CM | POA: Insufficient documentation

## 2018-05-11 DIAGNOSIS — W541XXA Struck by dog, initial encounter: Secondary | ICD-10-CM | POA: Diagnosis not present

## 2018-05-11 DIAGNOSIS — Z23 Encounter for immunization: Secondary | ICD-10-CM | POA: Diagnosis not present

## 2018-05-11 DIAGNOSIS — W540XXA Bitten by dog, initial encounter: Secondary | ICD-10-CM

## 2018-05-11 DIAGNOSIS — M79641 Pain in right hand: Secondary | ICD-10-CM | POA: Diagnosis not present

## 2018-05-11 DIAGNOSIS — S61451A Open bite of right hand, initial encounter: Secondary | ICD-10-CM

## 2018-05-11 MED ORDER — IBUPROFEN 200 MG PO TABS
600.0000 mg | ORAL_TABLET | Freq: Once | ORAL | Status: AC
  Administered 2018-05-11: 600 mg via ORAL
  Filled 2018-05-11: qty 3

## 2018-05-11 MED ORDER — ONDANSETRON 4 MG PO TBDP
4.0000 mg | ORAL_TABLET | Freq: Once | ORAL | Status: AC
  Administered 2018-05-11: 4 mg via ORAL
  Filled 2018-05-11: qty 1

## 2018-05-11 MED ORDER — AMOXICILLIN-POT CLAVULANATE 875-125 MG PO TABS: 1.0000 | ORAL_TABLET | Freq: Two times a day (BID) | ORAL | 0 refills | Status: AC

## 2018-05-11 MED ORDER — TETANUS-DIPHTH-ACELL PERTUSSIS 5-2.5-18.5 LF-MCG/0.5 IM SUSP
0.5000 mL | Freq: Once | INTRAMUSCULAR | Status: AC
  Administered 2018-05-11: 0.5 mL via INTRAMUSCULAR
  Filled 2018-05-11: qty 0.5

## 2018-05-11 NOTE — ED Notes (Signed)
ED provider at bedside.

## 2018-05-11 NOTE — ED Triage Notes (Signed)
Patient c/o dog bite to right hand. Reports she was breaking up a fight between her two dogs. States they are not up to date on immunizations. Puncture wound noted to right hand. States last tetanus was in 2012.

## 2018-05-11 NOTE — ED Notes (Signed)
Rad Tech at bedside to take pt for xray of hand.

## 2018-05-11 NOTE — Discharge Instructions (Addendum)
Please read instructions below.  Keep your wound clean and covered. Soak/flush your wound with warm water daily. Take the antibiotic as prescribed until gone. You can take Advil/ibuprofen every 6 hours as needed for pain. You can contact animal control regarding the rabies status of your pet. Follow up with your primary care or urgent care for wound recheck in 2 days.  Return to the ER for fever, worsening redness, or new or worsening symptoms.

## 2018-05-11 NOTE — ED Notes (Signed)
Pt states she was bitten by her dog while breaking up a fight. Pt states that her dogs are not up to date on their shots. Reports last tetanus was in 2012.

## 2018-05-11 NOTE — ED Notes (Signed)
Pt returned from X-ray.  

## 2018-05-11 NOTE — ED Provider Notes (Signed)
[facility] DEPT Provider Note   CSN: [id number] Arrival date & time: 05/11/18  2022    History   Chief Complaint Chief Complaint  Patient presents with  . Animal Bite    HPI [name] is a 53 y.o. female presenting to the emergency department with complaint of dog bite that occurred prior to arrival.  Patient states the dog was her dog, he was in a fight with another dog.  She states she broke up the fight and sustained a bite wound to her right hand.  She has mild associated pain.  No medications tried prior to arrival.  Last tetanus immunization was in 2012.  She states her dog is not up-to-date on vaccines, however, prefer to hold on rabies prophylaxis at this time. Denies hx immunocompromise.     The history is provided by the patient.    Past Medical History:  Diagnosis Date  . Anxiety   . Bipolar affective disorder, mixed, in partial or remission   . Depression   . Dysrhythmia    Left sided heart diease  . Headache(784.0)   . History of gallstones   . Kidney stones   . Mental disorder   . Migraine   . Pneumonia    Hx: of as a child  . PONV (postoperative nausea and vomiting)   . Renal disorder   . Sleep apnea    sleep test 2014, did not have osa  . Substance abuse (HCC)    Fentanyl, Percocet. last use 04/2011    Patient Active Problem List   Diagnosis Date Noted  . Achilles tendon contracture, right 04/18/2016  . OAB (overactive bladder) 02/28/2016  . Urgency incontinence 02/28/2016  . Situational anxiety 06/24/2015  . Mass of arm 04/09/2015  . Deviated septum 11/17/2014  . Gastroesophageal reflux disease without esophagitis 09/02/2014  . Chronic pain syndrome 05/24/2014  . Bereavement 03/13/2014  . Bipolar affective disorder, depressed, severe ([place]) 03/11/2014  . Opiate dependence ([place]) 03/11/2014  . Intentional opiate overdose ([place]) 03/11/2014  . De Quervain's tenosynovitis, left 12/12/2013  . Nausea with vomiting  07/28/2013  . Thrombocytopenia, unspecified ([place]) 07/28/2013  . External hemorrhoids 02/16/2013  . Metatarsalgia, right foot 05/18/2011  . Schizoaffective disorder, bipolar type ([place]) 03/31/2011    Past Surgical History:  Procedure Laterality Date  . ABDOMINAL HYSTERECTOMY    . 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 BUNIONECTOMY     11/14/2011  . CHOLECYSTECTOMY    . COLONOSCOPY     2001, 2008.  Father has colon cancer  . FOOT SURGERY     right x 2  . NASAL SEPTOPLASTY W/ TURBINOPLASTY Bilateral 12/04/2014   Procedure: NASAL SEPTOPLASTY WITH BILATERAL TURBINATE REDUCTION;  Surgeon: Izora Gala, MD;  Location: [facility];  Service: ENT;  Laterality: Bilateral;  . TOTAL ABDOMINAL HYSTERECTOMY W/ BILATERAL SALPINGOOPHORECTOMY    . VAGINAL HYSTERECTOMY       OB History   No obstetric history on file.      Home Medications    Prior to Admission medications   Medication Sig Start Date End Date Taking? Authorizing Provider  albuterol (PROVENTIL HFA;VENTOLIN HFA) 108 (90 Base) MCG/ACT inhaler Inhale 1-2 puffs into the lungs every 6 (six) hours as needed for wheezing or shortness of breath. 03/14/16   Barnet Glasgow, NP  amoxicillin-clavulanate (AUGMENTIN) 875-125 MG tablet Take 1 tablet by mouth every 12 (twelve) hours for 7 days. 05/11/18 05/18/18  Kuron Docken, [place] N, PA-C  busPIRone (BUSPAR) 7.5 MG tablet Take 1 tablet (7.5 mg total) by mouth 2 (two) times daily. 03/02/18   Shugart, Lissa Hoard, PA-C  Cariprazine HCl (VRAYLAR) 6 MG CAPS Take 1 capsule (6 mg total) by mouth daily. Patient not taking: Reported on 03/31/2018 03/02/18   Comer Locket, PA-C  gabapentin (NEURONTIN) 300 MG capsule Take by mouth. 09/11/17 09/11/18  [provider]  ketoconazole (NIZORAL) 2 % shampoo Apply 1 application  topically 2 (two) times a week. 04/29/18   Fatima Blank, MD  lithium carbonate 300 MG capsule Take 2 capsules (600 mg total) by mouth at bedtime. 03/02/18   Shugart, Lissa Hoard, PA-C  QUEtiapine (SEROQUEL) 400 MG tablet Take 2 tablets (800 mg total) by mouth at bedtime. For mood control 03/02/18   Shugart, Lissa Hoard, PA-C  terbinafine (LAMISIL) 250 MG tablet Take 1 tablet (250 mg total) by mouth daily. 03/31/18   Brunetta Jeans, PA-C  traMADol (ULTRAM) 50 MG tablet TK 1 T PO Q 6 H PRN P 02/16/18   [provider]  traZODone (DESYREL) 100 MG tablet Take 2 tablets (200 mg total) by mouth at bedtime. 03/02/18   Shugart, Lissa Hoard, PA-C  zolpidem (AMBIEN) 10 MG tablet Take 1 tablet (10 mg total) by mouth at bedtime. 03/02/18   Comer Locket, PA-C    Family History Family History  Problem Relation Age of Onset  . Colon cancer Father 66  . Breast cancer Father   . Breast cancer Mother   . Uterine cancer Mother   . Stomach cancer Neg Hx   . Rectal cancer Neg Hx   . Esophageal cancer Neg Hx     Social History Social History   Tobacco Use  . Smoking status: Never Smoker  . Smokeless tobacco: Never Used  Substance Use Topics  . Alcohol use: No    Alcohol/week: 0.0 standard drinks  . Drug use: No    Types: Other-see comments, Fentanyl, Hydrocodone     Allergies   Patient has no known allergies.   Review of Systems Review of Systems  Musculoskeletal: Positive for myalgias.  Skin: Positive for wound.     Physical Exam Updated Vital Signs BP (!) 148/92 (BP Location: Left Arm)   Pulse 76   Temp 98.9 F (37.2 C) (Oral)   Resp 17   SpO2 94%   Physical Exam Vitals signs and nursing note reviewed.  Constitutional:      General: She is not in acute distress.    Appearance: She is well-developed.  HENT:     Head: Normocephalic and atraumatic.  Eyes:     Conjunctiva/sclera: Conjunctivae normal.  Cardiovascular:     Rate and Rhythm: Normal rate.  Pulmonary:     Effort:  Pulmonary effort is normal.  Musculoskeletal:     Comments: There is a small puncture wound and abrasion to the palmar aspect of the right hand just proximal to the fifth digit.  There is also a small puncture wound center of the hand on the dorsal aspect.  Wounds are not actively bleeding.  No obvious foreign body.  Normal range of motion of digits.  Nl Sensation.  Intact pulses.  Neurological:     Mental Status: She is alert.  Psychiatric:        Mood and Affect: Mood normal.        Behavior: Behavior  normal.      ED Treatments / Results  Labs (all labs ordered are listed, but only abnormal results are displayed) Labs Reviewed - No data to display  EKG None  Radiology Dg Hand Complete Right  Result Date: 05/11/2018 CLINICAL DATA:  Dog bite EXAM: RIGHT HAND - COMPLETE 3+ VIEW COMPARISON:  11/29/2016 FINDINGS: There is no evidence of fracture or dislocation. There is no evidence of arthropathy or other focal bone abnormality. Soft tissues are unremarkable. IMPRESSION: Negative. Electronically Signed   By: Rolm Baptise M.D.   On: 05/11/2018 22:36    Procedures Procedures (including critical care time)  Medications Ordered in ED Medications  Tdap (BOOSTRIX) injection 0.5 mL (0.5 mLs Intramuscular Given 05/11/18 2303)  ibuprofen (ADVIL,MOTRIN) tablet 600 mg (600 mg Oral Given 05/11/18 2302)  ondansetron (ZOFRAN-ODT) disintegrating tablet 4 mg (4 mg Oral Given 05/11/18 2303)     Initial Impression / Assessment and Plan / ED Course  I have reviewed the triage vital signs and the nursing notes.  Pertinent labs & imaging results that were available during my care of the patient were reviewed by me and considered in my medical decision making (see chart for details).       Patient presents with small puncture wounds from a dog bite.  Pt wounds irrigated well   Wounds examined with visualization of the base and no foreign bodies seen. Nl ROM. NV intact. Xray is negative. Patient  tetanus updated.  Patient rabies vaccine and immunoglobulin risk and benefit discussed.  Pt would prefer to contact animal control, as this is her dog. Pain treated in the emergency department. Pt discharged with augmentin and symptomatic management. Pt agreeable. Close PCP follow up for wound recheck.  Discussed results, findings, treatment and follow up. Patient advised of return precautions. Patient verbalized understanding and agreed with plan.  Final Clinical Impressions(s) / ED Diagnoses   Final diagnoses:  Dog bite of right hand, initial encounter    ED Discharge Orders         Ordered    amoxicillin-clavulanate (AUGMENTIN) 875-125 MG tablet  Every 12 hours     05/11/18 2308           Berea Majkowski, [place] N, PA-C 05/11/18 2317    Duffy Bruce, MD 05/12/18 [phone]

## 2018-05-11 NOTE — ED Notes (Signed)
ED registration at bedside.

## 2018-05-18 ENCOUNTER — Ambulatory Visit (INDEPENDENT_AMBULATORY_CARE_PROVIDER_SITE_OTHER): Payer: Medicare Other | Admitting: Mental Health

## 2018-05-18 ENCOUNTER — Encounter: Payer: Self-pay | Admitting: Mental Health

## 2018-05-18 DIAGNOSIS — F3181 Bipolar II disorder: Secondary | ICD-10-CM

## 2018-05-18 DIAGNOSIS — F250 Schizoaffective disorder, bipolar type: Secondary | ICD-10-CM | POA: Diagnosis not present

## 2018-05-18 DIAGNOSIS — F411 Generalized anxiety disorder: Secondary | ICD-10-CM | POA: Diagnosis not present

## 2018-05-18 NOTE — Progress Notes (Signed)
[facility] Counsel[facility] Initial Adult Exam  Name: [name] Date: 05/18/2018 MRN: [id number] DOB: [birth year] PCP: Brunetta Jeans, PA-C  Time spent: 45 minutes   Guardian/Payee:  patient  Paperwork requested:  No   Reason for Visit /Presenting Problem: Agitated and upset about 3 dogs having a big fight inside her house and leaving it "like a murder site." Called 911 and was told to leave the house.  Mental Status Exam:   Appearance:   Casual     Behavior:  agitated  Motor:  Shuffling Gait  Speech/Language:   Pressured  Affect:  Depressed, Labile and anxious  Mood:  anxious, depressed and irritable  Thought process:  logical  Thought content:    Rumination  Sensory/Perceptual disturbances:    WNL  Orientation:  oriented to person, place and time/date  Attention:  Fair  Concentration:  Fair  Memory:  fair  Fund of knowledge:   Fair  Insight:    Fair  Judgment:   Fair  Impulse Control:  Fair   Reported Symptoms:   Anxiety, stress, agitation, worry.  Risk Assessment: Danger to Self:  No Self-injurious Behavior: No Danger to Others: No Duty to Warn:no Physical Aggression / Violence:No  Access to Firearms a concern: No  Gang Involvement:No  Patient / guardian was educated about steps to take if suicide or homicide risk level increases between visits: n/a While future psychiatric events cannot be accurately predicted, the patient does not currently require acute inpatient psychiatric care and does not currently meet [facility] involuntary commitment criteria.  Substance Abuse History: Current substance abuse: No     Past Psychiatric History:   Previous psychological history is significant for bipolar Outpatient Providers:unknown History of Psych Hospitalization: Yes  Psychological Testing: unknown   Abuse History: Victim of , N/A   Report needed: No. Victim of Neglect:No. Perpetrator of N/A  Witness / Exposure to Domestic Violence: No   Protective Services  Involvement: No  Witness to [employer] Violence:  No   Family History:  Family History  Problem Relation Age of Onset  . Colon cancer Father 66  . Breast cancer Father   . Breast cancer Mother   . Uterine cancer Mother   . Stomach cancer Neg Hx   . Rectal cancer Neg Hx   . 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 History of legal issue / charges: None personally  Medical History/Surgical History:reviewed Past Medical History:  Diagnosis Date  . Anxiety   . Bipolar affective disorder, mixed, in partial or remission   . Depression   . Dysrhythmia    Left sided heart diease  . Headache(784.0)   . History of gallstones   .  Kidney stones   . Mental disorder   . Migraine   . Pneumonia    Hx: of as a child  . PONV (postoperative nausea and vomiting)   . Renal disorder   . Sleep apnea    sleep test 2014, did not have osa  . Substance abuse (HCC)    Fentanyl, Percocet. last use 04/2011    Past Surgical History:  Procedure Laterality Date  . ABDOMINAL HYSTERECTOMY    . 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 BUNIONECTOMY     11/14/2011  . CHOLECYSTECTOMY    . COLONOSCOPY     2001, 2008.  Father has colon cancer  . FOOT SURGERY     right x 2  . NASAL SEPTOPLASTY W/ TURBINOPLASTY Bilateral 12/04/2014   Procedure: NASAL SEPTOPLASTY WITH BILATERAL TURBINATE REDUCTION;  Surgeon: Izora Gala, MD;  Location: [place];  Service: ENT;  Laterality: Bilateral;  . TOTAL ABDOMINAL HYSTERECTOMY W/ BILATERAL SALPINGOOPHORECTOMY    . VAGINAL HYSTERECTOMY      Medications: Current Outpatient Medications  Medication Sig Dispense Refill  . albuterol (PROVENTIL HFA;VENTOLIN HFA) 108 (90 Base) MCG/ACT inhaler Inhale 1-2 puffs into the lungs every 6 (six) hours as needed for wheezing or shortness of breath. 1 Inhaler 0  . amoxicillin-clavulanate (AUGMENTIN) 875-125 MG tablet Take 1 tablet by mouth every 12 (twelve) hours for 7 days. 14 tablet 0  . busPIRone (BUSPAR) 7.5 MG tablet Take 1 tablet (7.5 mg total) by mouth 2 (two) times daily. 60 tablet 2  . Cariprazine HCl (VRAYLAR) 6 MG CAPS Take 1 capsule (6 mg total) by mouth daily. (Patient not taking: Reported on 03/31/2018) 30 capsule 2  . gabapentin (NEURONTIN) 300 MG capsule Take by mouth.    Marland Kitchen ketoconazole (NIZORAL) 2 % shampoo Apply 1 application topically 2 (two) times a week. 120 mL 0  . lithium carbonate 300 MG capsule Take 2 capsules (600 mg total) by mouth at bedtime. 60 capsule 2  . QUEtiapine (SEROQUEL) 400 MG tablet Take 2 tablets (800 mg total) by mouth at bedtime. For mood control 60 tablet 2  . terbinafine (LAMISIL) 250 MG tablet Take 1 tablet (250 mg total) by mouth daily. 14 tablet 0  . traMADol (ULTRAM) 50 MG tablet TK 1 T PO Q 6 H PRN P    . traZODone (DESYREL) 100 MG tablet Take 2 tablets (200 mg total) by mouth at bedtime. 60 tablet 2  . zolpidem (AMBIEN) 10 MG tablet Take 1 tablet (10 mg total) by mouth at  bedtime. 30 tablet 2   No current facility-administered medications for this visit.     No Known Allergies  Diagnoses:    ICD-10-CM   1. Bipolar II disorder ([place]) F31.81   2. Schizoaffective disorder, bipolar type ([place]) F25.0   3. Generalized anxiety disorder F41.1     Plan of Care:  1. Continue nutrition program through [facility]                          2. Self care program                          3. Work on returning to [place] - tourist visa  4. Getting a TFL on line                          5. RTO 1 month   Rosary Lively, [facility]

## 2018-05-19 ENCOUNTER — Telehealth: Payer: Self-pay | Admitting: Psychiatry

## 2018-05-19 ENCOUNTER — Ambulatory Visit: Payer: Medicare Other | Admitting: Psychiatry

## 2018-05-19 DIAGNOSIS — F250 Schizoaffective disorder, bipolar type: Secondary | ICD-10-CM

## 2018-05-19 DIAGNOSIS — F314 Bipolar disorder, current episode depressed, severe, without psychotic features: Secondary | ICD-10-CM

## 2018-05-19 MED ORDER — ZOLPIDEM TARTRATE 10 MG PO TABS: 10.0000 mg | ORAL_TABLET | Freq: Every day | ORAL | 2 refills | Status: DC

## 2018-05-19 MED ORDER — LITHIUM CARBONATE 300 MG PO CAPS: 600.0000 mg | ORAL_CAPSULE | Freq: Every day | ORAL | 2 refills | Status: DC

## 2018-05-19 MED ORDER — QUETIAPINE FUMARATE 400 MG PO TABS: 800.0000 mg | ORAL_TABLET | Freq: Every day | ORAL | 2 refills | Status: DC

## 2018-05-19 MED ORDER — TRAZODONE HCL 100 MG PO TABS: 200.0000 mg | ORAL_TABLET | Freq: Every day | ORAL | 2 refills | Status: DC

## 2018-05-19 MED ORDER — BUSPIRONE HCL 15 MG PO TABS: 15.0000 mg | ORAL_TABLET | Freq: Two times a day (BID) | ORAL | 1 refills | Status: DC

## 2018-05-19 NOTE — Telephone Encounter (Signed)
Nurse to send lab work to pt's pcp

## 2018-05-19 NOTE — Progress Notes (Signed)
[facility] Med Check  Patient ID: [name],  MRN: [id number]  PCP: Brunetta Jeans, PA-C  Date of Evaluation: 05/19/2018 Time spent:20 minutes  Chief Complaint:   HISTORY/CURRENT STATUS: HPI patient last seen 03/02/2018.  She was doing fairly well at that appointment.  Looking back at her blood work her lipids her cholesterol is elevated at 211 triglycerides were okay.  LDL was 142.  Individual Medical History/ Review of Systems: Changes? :No   Allergies: Patient has no known allergies.  Current Medications:  Current Outpatient Medications:  .  albuterol (PROVENTIL HFA;VENTOLIN HFA) 108 (90 Base) MCG/ACT inhaler, Inhale 1-2 puffs into the lungs every 6 (six) hours as needed for wheezing or shortness of breath., Disp: 1 Inhaler, Rfl: 0 .  busPIRone (BUSPAR) 7.5 MG tablet, Take 1 tablet (7.5 mg total) by mouth 2 (two) times daily., Disp: 60 tablet, Rfl: 2 .  Cariprazine HCl (VRAYLAR) 6 MG CAPS, Take 1 capsule (6 mg total) by mouth daily. (Patient not taking: Reported on 03/31/2018), Disp: 30 capsule, Rfl: 2 .  gabapentin (NEURONTIN) 300 MG capsule, Take by mouth., Disp: , Rfl:  .  ketoconazole (NIZORAL) 2 % shampoo, Apply 1 application topically 2 (two) times a week., Disp: 120 mL, Rfl: 0 .  lithium carbonate 300 MG capsule, Take 2 capsules (600 mg total) by mouth at bedtime., Disp: 60 capsule, Rfl: 2 .  QUEtiapine (SEROQUEL) 400 MG tablet, Take 2 tablets (800 mg total) by mouth at bedtime. For mood control, Disp: 60 tablet, Rfl: 2 .  terbinafine (LAMISIL) 250 MG tablet, Take 1 tablet (250 mg total) by mouth daily., Disp: 14 tablet, Rfl: 0 .  traMADol (ULTRAM) 50 MG tablet, TK 1 T PO Q 6 H PRN P, Disp: , Rfl:  .  traZODone (DESYREL) 100 MG tablet, Take 2 tablets (200 mg total) by mouth at bedtime., Disp: 60 tablet, Rfl: 2 .  zolpidem (AMBIEN) 10 MG tablet, Take 1 tablet (10 mg total) by mouth at bedtime., Disp: 30 tablet, Rfl: 2 Medication Side Effects: no  Family Medical/  Social History: Changes?  Took out restraining order on son in law. Her dogs got into a fight.  MENTAL HEALTH EXAM:  There were no vitals taken for this visit.There is no height or weight on file to calculate BMI.  General Appearance: Casual  Eye Contact:  Good  Speech:  Clear and Coherent  Volume:  Normal  Mood:  Depressed  Affect:  Appropriate  Thought Process:  Linear  Orientation:  Full (Time, Place, and Person)  Thought Content: Logical   Suicidal Thoughts:  No  Homicidal Thoughts:  No  Memory:  WNL  Judgement:  Good  Insight:  Good  Psychomotor Activity:  Normal  Concentration:  Concentration: Good  Recall:  Good  Fund of Knowledge: Good  Language: Good  Assets:  Desire for Improvement  ADL's:  Intact  Cognition: WNL  Prognosis:  Good    DIAGNOSES: No diagnosis found.  Receiving Psychotherapy: Yes    RECOMMENDATIONS: Problem today is anxiety.  Right now her BuSpar is at 7.5 twice daily we will increase it to 15 mg twice daily.  We will have the nurse track down the lithium level because 1 was ordered. Recheck in 2 months also have her labs sent to her family physician.  Comer Locket, PA-C

## 2018-05-28 ENCOUNTER — Other Ambulatory Visit: Payer: Self-pay | Admitting: Psychiatry

## 2018-05-29 ENCOUNTER — Encounter (HOSPITAL_COMMUNITY): Payer: Self-pay

## 2018-05-29 ENCOUNTER — Emergency Department (HOSPITAL_COMMUNITY): Payer: Medicare Other

## 2018-05-29 ENCOUNTER — Other Ambulatory Visit: Payer: Self-pay

## 2018-05-29 ENCOUNTER — Emergency Department (HOSPITAL_COMMUNITY)
Admission: EM | Admit: 2018-05-29 | Discharge: 2018-05-30 | Disposition: A | Discharge status: Home / Self Care | Payer: Medicare Other | Attending: Emergency Medicine | Admitting: Emergency Medicine

## 2018-05-29 DIAGNOSIS — N12 Tubulo-interstitial nephritis, not specified as acute or chronic: Secondary | ICD-10-CM

## 2018-05-29 DIAGNOSIS — Z79899 Other long term (current) drug therapy: Secondary | ICD-10-CM | POA: Diagnosis not present

## 2018-05-29 DIAGNOSIS — R112 Nausea with vomiting, unspecified: Secondary | ICD-10-CM

## 2018-05-29 DIAGNOSIS — N10 Acute tubulo-interstitial nephritis: Secondary | ICD-10-CM | POA: Diagnosis not present

## 2018-05-29 DIAGNOSIS — R1110 Vomiting, unspecified: Secondary | ICD-10-CM | POA: Diagnosis present

## 2018-05-29 LAB — URINALYSIS, ROUTINE W REFLEX MICROSCOPIC
Bilirubin Urine: NEGATIVE
Glucose, UA: NEGATIVE mg/dL
Hgb urine dipstick: NEGATIVE
Ketones, ur: NEGATIVE mg/dL
Nitrite: NEGATIVE
Protein, ur: NEGATIVE mg/dL
Specific Gravity, Urine: 1.02 (ref 1.005–1.030)
WBC, UA: >50 WBC/hpf — ABNORMAL HIGH (ref 0–5)
pH: 6 (ref 5.0–8.0)

## 2018-05-29 LAB — CBC
HCT: 41.9 % (ref 36.0–46.0)
Hemoglobin: 13.1 g/dL (ref 12.0–15.0)
MCH: 29.6 pg (ref 26.0–34.0)
MCHC: 31.3 g/dL (ref 30.0–36.0)
MCV: 94.8 fL (ref 80.0–100.0)
Platelets: 168 10*3/uL (ref 150–400)
RBC: 4.42 MIL/uL (ref 3.87–5.11)
RDW: 12.9 % (ref 11.5–15.5)
WBC: 7.4 10*3/uL (ref 4.0–10.5)
nRBC: 0 % (ref 0.0–0.2)

## 2018-05-29 LAB — COMPREHENSIVE METABOLIC PANEL
ALBUMIN: 4.4 g/dL (ref 3.5–5.0)
ALT: 18 U/L (ref 0–44)
AST: 20 U/L (ref 15–41)
Alkaline Phosphatase: 67 U/L (ref 38–126)
Anion gap: 9 (ref 5–15)
BUN: 11 mg/dL (ref 6–20)
CO2: 26 mmol/L (ref 22–32)
Calcium: 9.4 mg/dL (ref 8.9–10.3)
Chloride: 105 mmol/L (ref 98–111)
Creatinine, Ser: 0.81 mg/dL (ref 0.44–1.00)
GFR calc Af Amer: >60 mL/min (ref >60)
GFR calc non Af Amer: >60 mL/min (ref >60)
GLUCOSE: 109 mg/dL — AB (ref 70–99)
Potassium: 4 mmol/L (ref 3.5–5.1)
Sodium: 140 mmol/L (ref 135–145)
Total Bilirubin: 0.5 mg/dL (ref 0.3–1.2)
Total Protein: 7.1 g/dL (ref 6.5–8.1)

## 2018-05-29 LAB — I-STAT BETA HCG BLOOD, ED (MC, WL, AP ONLY)

## 2018-05-29 LAB — LIPASE, BLOOD: Lipase: 42 U/L (ref 11–51)

## 2018-05-29 MED ORDER — SODIUM CHLORIDE 0.9% FLUSH
3.0000 mL | Freq: Once | INTRAVENOUS | Status: AC
  Administered 2018-05-29: 3 mL via INTRAVENOUS

## 2018-05-29 MED ORDER — ACETAMINOPHEN 500 MG PO TABS
1000.0000 mg | ORAL_TABLET | Freq: Once | ORAL | Status: AC
  Administered 2018-05-30: 1000 mg via ORAL
  Filled 2018-05-29: qty 2

## 2018-05-29 MED ORDER — CEPHALEXIN 500 MG PO CAPS: 500.0000 mg | ORAL_CAPSULE | Freq: Two times a day (BID) | ORAL | 0 refills | Status: AC

## 2018-05-29 MED ORDER — KETOROLAC TROMETHAMINE 15 MG/ML IJ SOLN
15.0000 mg | Freq: Once | INTRAMUSCULAR | Status: AC
  Administered 2018-05-30: 15 mg via INTRAVENOUS
  Filled 2018-05-29: qty 1

## 2018-05-29 MED ORDER — SODIUM CHLORIDE 0.9 % IV SOLN
1.0000 g | Freq: Once | INTRAVENOUS | Status: AC
  Administered 2018-05-29: 1 g via INTRAVENOUS
  Filled 2018-05-29: qty 10

## 2018-05-29 MED ORDER — DIPHENHYDRAMINE HCL 50 MG/ML IJ SOLN
25.0000 mg | Freq: Once | INTRAMUSCULAR | Status: AC
  Administered 2018-05-29: 25 mg via INTRAVENOUS
  Filled 2018-05-29: qty 1

## 2018-05-29 MED ORDER — PROCHLORPERAZINE EDISYLATE 10 MG/2ML IJ SOLN
10.0000 mg | Freq: Once | INTRAMUSCULAR | Status: AC
  Administered 2018-05-29: 10 mg via INTRAVENOUS
  Filled 2018-05-29: qty 2

## 2018-05-29 MED ORDER — PROMETHAZINE HCL 25 MG PO TABS: 25.0000 mg | ORAL_TABLET | Freq: Four times a day (QID) | ORAL | 0 refills | Status: DC | PRN

## 2018-05-29 MED ORDER — SODIUM CHLORIDE 0.9 % IV BOLUS
1000.0000 mL | Freq: Once | INTRAVENOUS | Status: AC
  Administered 2018-05-29: 1000 mL via INTRAVENOUS

## 2018-05-29 NOTE — ED Triage Notes (Signed)
States vomited 15 times today with some shortness of breath today denies any abdominal pain voiced. States headache since this am when vomiting started.

## 2018-05-29 NOTE — ED Notes (Signed)
Patient transported to X-ray 

## 2018-05-29 NOTE — ED Notes (Signed)
Urine culture sent to the lab. 

## 2018-05-29 NOTE — ED Provider Notes (Signed)
[place] DEPT Provider Note   CSN: [id number] Arrival date & time: 05/29/18  1932    History   Chief Complaint Chief Complaint  Patient presents with  . Emesis    HPI [name] is a 53 y.o. female.     HPI   Cough began yesterday since got back from [place] Last night stared vomiting, vomited 15 times per day, low back pain Feeling hot and cold no known fevers No diarrhea, no abdominal pain Some lower back pain No dysuria No urinary frequency Nonproductive cough  Past Medical History:  Diagnosis Date  . Anxiety   . Bipolar affective disorder, mixed, in partial or remission   . Depression   . Dysrhythmia    Left sided heart diease  . Headache(784.0)   . History of gallstones   . Kidney stones   . Mental disorder   . Migraine   . Pneumonia    Hx: of as a child  . PONV (postoperative nausea and vomiting)   . Renal disorder   . Sleep apnea    sleep test 2014, did not have osa  . Substance abuse (HCC)    Fentanyl, Percocet. last use 04/2011    Patient Active Problem List   Diagnosis Date Noted  . Achilles tendon contracture, right 04/18/2016  . OAB (overactive bladder) 02/28/2016  . Urgency incontinence 02/28/2016  . Situational anxiety 06/24/2015  . Mass of arm 04/09/2015  . Deviated septum 11/17/2014  . Gastroesophageal reflux disease without esophagitis 09/02/2014  . Chronic pain syndrome 05/24/2014  . Bereavement 03/13/2014  . Bipolar affective disorder, depressed, severe ([place]) 03/11/2014  . Opiate dependence ([place]) 03/11/2014  . Intentional opiate overdose ([place]) 03/11/2014  . De Quervain's tenosynovitis, left 12/12/2013  . Nausea with vomiting 07/28/2013  . Thrombocytopenia, unspecified ([place]) 07/28/2013  . External hemorrhoids 02/16/2013  . Metatarsalgia, right foot 05/18/2011  . Schizoaffective disorder, bipolar type ([place]) 03/31/2011    Past Surgical History:  Procedure Laterality Date  . ABDOMINAL  HYSTERECTOMY    . 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 BUNIONECTOMY     11/14/2011  . CHOLECYSTECTOMY    . COLONOSCOPY     2001, 2008.  Father has colon cancer  . FOOT SURGERY     right x 2  . NASAL SEPTOPLASTY W/ TURBINOPLASTY Bilateral 12/04/2014   Procedure: NASAL SEPTOPLASTY WITH BILATERAL TURBINATE REDUCTION;  Surgeon: Izora Gala, MD;  Location: [place];  Service: ENT;  Laterality: Bilateral;  . TOTAL ABDOMINAL HYSTERECTOMY W/ BILATERAL SALPINGOOPHORECTOMY    . VAGINAL HYSTERECTOMY       OB History   No obstetric history on file.      Home Medications    Prior to Admission medications   Medication Sig Start Date End Date Taking? Authorizing Provider  albuterol (PROVENTIL HFA;VENTOLIN HFA) 108 (90 Base) MCG/ACT inhaler Inhale 1-2 puffs into the lungs every 6 (six) hours as needed for wheezing or shortness of breath. 03/14/16  Yes Barnet Glasgow, NP  busPIRone (BUSPAR) 15 MG tablet Take 1 tablet (15 mg total) by mouth 2 (two) times daily. 05/19/18  Yes Shugart, Lissa Hoard, PA-C  gabapentin (NEURONTIN) 300 MG capsule Take 300 mg by mouth daily.  09/11/17 09/11/18 Yes [provider]  ketoconazole (NIZORAL) 2 % shampoo Apply 1 application topically 2 (two)  times a week. 04/29/18  Yes Cardama, Grayce Sessions, MD  lithium carbonate 300 MG capsule Take 2 capsules (600 mg total) by mouth at bedtime. 05/19/18  Yes Shugart, Lissa Hoard, PA-C  QUEtiapine (SEROQUEL) 400 MG tablet Take 2 tablets (800 mg total) by mouth at bedtime. For mood control 05/19/18  Yes Shugart, Lissa Hoard, PA-C  traMADol (ULTRAM) 50 MG tablet TK 1 T PO Q 6 H PRN P 02/16/18  Yes [provider]  traZODone (DESYREL) 100 MG tablet Take 2 tablets (200 mg total) by mouth at bedtime. 05/19/18  Yes Shugart, Lissa Hoard, PA-C  zolpidem (AMBIEN) 10  MG tablet Take 1 tablet (10 mg total) by mouth at bedtime. 05/19/18  Yes Shugart, Lissa Hoard, PA-C  cephALEXin (KEFLEX) 500 MG capsule Take 1 capsule (500 mg total) by mouth 2 (two) times daily for 10 days. 05/29/18 06/08/18  Gareth Morgan, MD  promethazine (PHENERGAN) 25 MG tablet Take 1 tablet (25 mg total) by mouth every 6 (six) hours as needed for nausea or vomiting. 05/29/18   Gareth Morgan, MD  terbinafine (LAMISIL) 250 MG tablet Take 1 tablet (250 mg total) by mouth daily. Patient not taking: Reported on 05/29/2018 03/31/18   Brunetta Jeans, PA-C  zolpidem (AMBIEN) 10 MG tablet Take 1 tablet (10 mg total) by mouth at bedtime. For sleep 03/15/14 05/19/19  Encarnacion Slates, NP    Family History Family History  Problem Relation Age of Onset  . Colon cancer Father 20  . Breast cancer Father   . Breast cancer Mother   . Uterine cancer Mother   . Stomach cancer Neg Hx   . Rectal cancer Neg Hx   . Esophageal cancer Neg Hx     Social History Social History   Tobacco Use  . Smoking status: Never Smoker  . Smokeless tobacco: Never Used  Substance Use Topics  . Alcohol use: No    Alcohol/week: 0.0 standard drinks  . Drug use: No    Types: Other-see comments, Fentanyl, Hydrocodone     Allergies   Patient has no known allergies.   Review of Systems Review of Systems  Constitutional: Positive for fatigue. Negative for fever.  HENT: Negative for sore throat.   Eyes: Negative for visual disturbance.  Respiratory: Positive for cough. Negative for shortness of breath.   Cardiovascular: Negative for chest pain.  Gastrointestinal: Positive for nausea and vomiting. Negative for abdominal pain and diarrhea.  Genitourinary: Negative for difficulty urinating.  Musculoskeletal: Positive for back pain.  Skin: Negative for rash.  Neurological: Positive for headaches. Negative for syncope.     Physical Exam Updated Vital Signs BP 124/86 (BP Location: Right Arm)   Pulse 74   Temp 99.3 F  (37.4 C) (Oral)   Resp 16   Ht 5\' 6"  (1.676 m)   Wt 95.3 kg   SpO2 98%   BMI 33.89 kg/m   Physical Exam Vitals signs and nursing note reviewed.  Constitutional:      General: She is not in acute distress.    Appearance: She is well-developed. She is not diaphoretic.  HENT:     Head: Normocephalic and atraumatic.  Eyes:     Conjunctiva/sclera: Conjunctivae normal.  Neck:     Musculoskeletal: Normal range of motion.  Cardiovascular:     Rate and Rhythm: Normal rate and regular rhythm.     Heart sounds: Normal heart sounds. No murmur. No friction rub. No gallop.   Pulmonary:     Effort: Pulmonary effort is normal. No  respiratory distress.     Breath sounds: Normal breath sounds. No wheezing or rales.  Abdominal:     General: There is no distension.     Palpations: Abdomen is soft.     Tenderness: There is no abdominal tenderness. There is no guarding.  Musculoskeletal:        General: No tenderness.  Skin:    General: Skin is warm and dry.     Findings: No erythema or rash.  Neurological:     Mental Status: She is alert and oriented to person, place, and time.      ED Treatments / Results  Labs (all labs ordered are listed, but only abnormal results are displayed) Labs Reviewed  COMPREHENSIVE METABOLIC PANEL - Abnormal; Notable for the following components:      Result Value   Glucose, Bld 109 (*)    All other components within normal limits  URINALYSIS, ROUTINE W REFLEX MICROSCOPIC - Abnormal; Notable for the following components:   APPearance HAZY (*)    Leukocytes,Ua LARGE (*)    WBC, UA >50 (*)    Bacteria, UA RARE (*)    All other components within normal limits  LIPASE, BLOOD  CBC  I-STAT BETA HCG BLOOD, ED ([facility], [facility], [facility] ONLY)    EKG None  Radiology Dg Chest 2 View  Result Date: 05/29/2018 CLINICAL DATA:  Vomiting and shortness of breath today.  Headache. EXAM: CHEST - 2 VIEW COMPARISON:  12/23/2016 FINDINGS: The heart size and mediastinal contours  are within normal limits. Both lungs are clear. The visualized skeletal structures are unremarkable. IMPRESSION: No active cardiopulmonary disease. Electronically Signed   By: Lucienne Capers M.D.   On: 05/29/2018 22:41    Procedures Procedures (including critical care time)  Medications Ordered in ED Medications  sodium chloride flush (NS) 0.9 % injection 3 mL (3 mLs Intravenous Given 05/29/18 2104)  sodium chloride 0.9 % bolus 1,000 mL (0 mLs Intravenous Stopped 05/30/18 0010)  prochlorperazine (COMPAZINE) injection 10 mg (10 mg Intravenous Given 05/29/18 2248)  diphenhydrAMINE (BENADRYL) injection 25 mg (25 mg Intravenous Given 05/29/18 2250)  cefTRIAXone (ROCEPHIN) 1 g in sodium chloride 0.9 % 100 mL IVPB (0 g Intravenous Stopped 05/30/18 0011)  ketorolac (TORADOL) 15 MG/ML injection 15 mg (15 mg Intravenous Given 05/30/18 0014)  acetaminophen (TYLENOL) tablet 1,000 mg (1,000 mg Oral Given 05/30/18 0013)     Initial Impression / Assessment and Plan / ED Course  I have reviewed the triage vital signs and the nursing notes.  Pertinent labs & imaging results that were available during my care of the patient were reviewed by me and considered in my medical decision making (see chart for details).        53yo female with history above presents with concern for nausea, vomiting, back pain and cough.  Abdominal exam benign, no distention.  Doubt appendicitis, diverticulitis, SBO.  CXR without pneumonia.  Possible viral etiology of cough.  Afebrile, presence of bacterial infection, do not feel COVID testing indicated.  Large leukocytes on UA, and given combination of n/v, back pain, feel pyelonephritis most likely.   Given rocephin, IV fluids, headache cocktail in ED.  Given additional toradol, tylenol.  Tolerating po. Stable for outpatient treatment with keflex. Patient discharged in stable condition with understanding of reasons to return.    Final Clinical Impressions(s) / ED Diagnoses    Final diagnoses:  Nausea and vomiting, intractability of vomiting not specified, unspecified vomiting type  Pyelonephritis    ED Discharge  Orders         Ordered    promethazine (PHENERGAN) 25 MG tablet  Every 6 hours PRN     05/29/18 2348    cephALEXin (KEFLEX) 500 MG capsule  2 times daily     05/29/18 2348           Gareth Morgan, MD 05/30/18 1010

## 2018-06-01 ENCOUNTER — Ambulatory Visit: Payer: Medicare Other | Admitting: Mental Health

## 2018-06-03 ENCOUNTER — Ambulatory Visit: Payer: Medicare Other | Admitting: Psychiatry

## 2018-06-29 ENCOUNTER — Other Ambulatory Visit: Payer: Self-pay

## 2018-06-29 ENCOUNTER — Encounter: Payer: Self-pay | Admitting: Physician Assistant

## 2018-06-29 ENCOUNTER — Ambulatory Visit: Payer: Self-pay

## 2018-06-29 ENCOUNTER — Ambulatory Visit (INDEPENDENT_AMBULATORY_CARE_PROVIDER_SITE_OTHER): Payer: Medicare Other | Admitting: Physician Assistant

## 2018-06-29 VITALS — Temp 98.2°F | Wt 213.6 lb

## 2018-06-29 DIAGNOSIS — R6889 Other general symptoms and signs: Secondary | ICD-10-CM

## 2018-06-29 DIAGNOSIS — Z20822 Contact with and (suspected) exposure to covid-19: Secondary | ICD-10-CM

## 2018-06-29 MED ORDER — ALBUTEROL SULFATE HFA 108 (90 BASE) MCG/ACT IN AERS: 1.0000 | INHALATION_SPRAY | Freq: Four times a day (QID) | RESPIRATORY_TRACT | 0 refills | Status: DC | PRN

## 2018-06-29 MED ORDER — BENZONATATE 100 MG PO CAPS: 100.0000 mg | ORAL_CAPSULE | Freq: Three times a day (TID) | ORAL | 0 refills | Status: DC | PRN

## 2018-06-29 MED ORDER — DOXYCYCLINE HYCLATE 100 MG PO TABS: 100.0000 mg | ORAL_TABLET | Freq: Two times a day (BID) | ORAL | 0 refills | Status: DC

## 2018-06-29 NOTE — Progress Notes (Signed)
Virtual Visit via Video   I connected with [name] on 06/29/18 at  3:00 PM EDT by a video enabled telemedicine application and verified that I am speaking with the correct person using two identifiers.  Location patient: Home Location provider: [name], Office Persons participating in the virtual visit: Patient, Provider, [place] (Patina Moore)  I discussed the limitations of evaluation and management by telemedicine and the availability of in person appointments. The patient expressed understanding and agreed to proceed.  Subjective:   HPI:   Patient endorses 1.5-2 weeks of SOB, chills, with dry cough. Was recently seen an evaluated in the ER for pyelonephritis prior to onset of these symptoms. She did take antibiotic as directed. Denies any residual urinary symptoms, resolution of back pain. Notes nausea without vomiting.  Appetite is well. Is staying hydrated.  Denies diarrhea. Is noting continued SOB -- described as chest tightness. Denies wheezing. Was using albuterol inhaler which helped but has run out of medication.   ROS: See pertinent positives and negatives per HPI.  Patient Active Problem List   Diagnosis Date Noted  . Achilles tendon contracture, right 04/18/2016  . OAB (overactive bladder) 02/28/2016  . Urgency incontinence 02/28/2016  . Situational anxiety 06/24/2015  . Mass of arm 04/09/2015  . Deviated septum 11/17/2014  . Gastroesophageal reflux disease without esophagitis 09/02/2014  . Chronic pain syndrome 05/24/2014  . Bereavement 03/13/2014  . Bipolar affective disorder, depressed, severe ([place]) 03/11/2014  . Opiate dependence ([place]) 03/11/2014  . Intentional opiate overdose ([place]) 03/11/2014  . De Quervain's tenosynovitis, left 12/12/2013  . Nausea with vomiting 07/28/2013  . Thrombocytopenia, unspecified ([place]) 07/28/2013  . External hemorrhoids 02/16/2013  . Metatarsalgia, right foot 05/18/2011  . Schizoaffective disorder, bipolar type  ([place]) 03/31/2011    Social History   Tobacco Use  . Smoking status: Never Smoker  . Smokeless tobacco: Never Used  Substance Use Topics  . Alcohol use: No    Alcohol/week: 0.0 standard drinks    Current Outpatient Medications:  .  busPIRone (BUSPAR) 15 MG tablet, Take 1 tablet (15 mg total) by mouth 2 (two) times daily., Disp: 60 tablet, Rfl: 1 .  gabapentin (NEURONTIN) 300 MG capsule, Take 300 mg by mouth daily. , Disp: , Rfl:  .  ketoconazole (NIZORAL) 2 % shampoo, Apply 1 application topically 2 (two) times a week., Disp: 120 mL, Rfl: 0 .  lithium carbonate 300 MG capsule, Take 2 capsules (600 mg total) by mouth at bedtime., Disp: 60 capsule, Rfl: 2 .  promethazine (PHENERGAN) 25 MG tablet, Take 1 tablet (25 mg total) by mouth every 6 (six) hours as needed for nausea or vomiting., Disp: 30 tablet, Rfl: 0 .  QUEtiapine (SEROQUEL) 400 MG tablet, Take 2 tablets (800 mg total) by mouth at bedtime. For mood control, Disp: 60 tablet, Rfl: 2 .  traMADol (ULTRAM) 50 MG tablet, TK 1 T PO Q 6 H PRN P, Disp: , Rfl:  .  traZODone (DESYREL) 100 MG tablet, Take 2 tablets (200 mg total) by mouth at bedtime., Disp: 60 tablet, Rfl: 2 .  zolpidem (AMBIEN) 10 MG tablet, Take 1 tablet (10 mg total) by mouth at bedtime. For sleep, Disp: 5 tablet, Rfl: 0 .  albuterol (PROVENTIL HFA;VENTOLIN HFA) 108 (90 Base) MCG/ACT inhaler, Inhale 1-2 puffs into the lungs every 6 (six) hours as needed for wheezing or shortness of breath. (Patient not taking: Reported on 06/29/2018), Disp: 1 Inhaler, Rfl: 0  No Known Allergies  Objective:  Temp 98.2 F (36.8 C) (Oral)   Wt 213 lb 9.6 oz (96.9 kg)   BMI 34.48 kg/m  General appearance: alert, cooperative, appears stated age and no distress Head: Normocephalic, without obvious abnormality, atraumatic Eyes: conjunctiva clear Neck: supple Neurologic: Grossly normal  Lungs: No noted labored breathing, wheezing or distress during visit. Patient did not cough during  video visit.  Assessment and Plan:       1. Suspected Covid-19 Virus Infection Mild. Symptoms mostly improved except for windedness. She was in no distress or difficulty breathing during assessment. No cough. She is to remain home until symptoms have resolved. Start Tessalon per orders. Albuterol refilled. Will give Rx Doxycycline if symptoms recur in case of any bacterial bronchitis present giving patients history. Strict follow-up and ER precautions reviewed with patient.  - benzonatate (TESSALON) 100 MG capsule; Take 1 capsule (100 mg total) by mouth 3 (three) times daily as needed for cough.  Dispense: 30 capsule; Refill: 0    Leeanne Rio, [place] 06/29/2018

## 2018-06-29 NOTE — Progress Notes (Signed)
I have discussed the procedure for the virtual visit with the patient who has given consent to proceed with assessment and treatment.   Rylen Swindler S [place] Luddy, CMA     

## 2018-06-29 NOTE — Telephone Encounter (Signed)
Pt c/o symptoms: dry cough, SOB from walking around her house, nausea, body aches, chills, runny nose,loss of taste. Pt stated her normal body temperature is 96.3 but her temperature is 98.7. No international or domestic travel no known exposure to positive covid-19 pt. Email verified. Pt has a h/o asthma. Care advice given a pt verbalized understanding. Pt transferred to [facility] at office.        Reason for Disposition . HIGH RISK patient (e.g., age > 95 years, diabetes, heart or lung disease, weak immune system) . MILD difficulty breathing (e.g., minimal/no SOB at rest, SOB with walking, pulse <100)  Answer Assessment - Initial Assessment Questions 1. COVID-19 DIAGNOSIS: "Who made your Coronavirus (COVID-19) diagnosis?" "Was it confirmed by a positive lab test?" If not diagnosed by a HCP, ask "Are there lots of cases (community spread) where you live?" (See public health department website, if unsure)   * MAJOR community spread: high number of cases; numbers of cases are increasing; many people hospitalized.   * MINOR community spread: low number of cases; not increasing; few or no people hospitalized     major 2. ONSET: "When did the COVID-19 symptoms start?"      2 weeks ago 3. WORST SYMPTOM: "What is your worst symptom?" (e.g., cough, fever, shortness of breath, muscle aches)     SOB 4. COUGH: "How bad is the cough?"       Occasional dry cough 5. FEVER: "Do you have a fever?" If so, ask: "What is your temperature, how was it measured, and when did it start?"     no 6. RESPIRATORY STATUS: "Describe your breathing?" (e.g., shortness of breath, wheezing, unable to speak)      SOB, ges winded from walking around  house 7. BETTER-SAME-WORSE: "Are you getting better, staying the same or getting worse compared to yesterday?"  If getting worse, ask, "In what way?"     Getting worse 8. HIGH RISK DISEASE: "Do you have any chronic medical problems?" (e.g., asthma, heart or lung disease, weak  immune system, etc.)     asthma 9. PREGNANCY: "Is there any chance you are pregnant?" "When was your last menstrual period?"     N/a n/a 10. OTHER SYMPTOMS: "Do you have any other symptoms?"  (e.g., runny nose, headache, sore throat, loss of smell)       Nausea, body aches, chills, runny nose, dry cough, loss of taste  Protocols used: CORONAVIRUS (COVID-19) DIAGNOSED OR SUSPECTED-A-AH

## 2018-07-02 ENCOUNTER — Ambulatory Visit: Payer: Self-pay | Admitting: Physician Assistant

## 2018-07-02 NOTE — Telephone Encounter (Signed)
  Pt. Reports she still has a cough, low grade temp. 100.0, chills. Has not taken any Tylenol or Motrin. Instructed to take either for temp. Reports she feels the same as when she saw Mr. Hassell Done 06/29/18. Will fill her prescriptions for Doxycycline and Tessalon.Instructed to stay well hydrated and rest. Instructed if symptoms worsen to go to ED . Verbalizes understanding.  Reason for Disposition . 1] COVID-19 infection diagnosed or suspected AND [2] mild symptoms (fever, cough) AND [2] no trouble breathing or other complications  Answer Assessment - Initial Assessment Questions 1. COVID-19 DIAGNOSIS: "Who made your Coronavirus (COVID-19) diagnosis?" "Was it confirmed by a positive lab test?" If not diagnosed by a HCP, ask "Are there lots of cases (community spread) where you live?" (See public health department website, if unsure)   * MAJOR community spread: high number of cases; numbers of cases are increasing; many people hospitalized.   * MINOR community spread: low number of cases; not increasing; few or no people hospitalized     Minor 2. ONSET: "When did the COVID-19 symptoms start?"      1-2 weeks ago 3. WORST SYMPTOM: "What is your worst symptom?" (e.g., cough, fever, shortness of breath, muscle aches)     Fever, cough 4. COUGH: "How bad is the cough?"       Same as it was on the 14 5. FEVER: "Do you have a fever?" If so, ask: "What is your temperature, how was it measured, and when did it start?"     100.0 6. RESPIRATORY STATUS: "Describe your breathing?" (e.g., shortness of breath, wheezing, unable to speak)      No 7. BETTER-SAME-WORSE: "Are you getting better, staying the same or getting worse compared to yesterday?"  If getting worse, ask, "In what way?"     Same 8. HIGH RISK DISEASE: "Do you have any chronic medical problems?" (e.g., asthma, heart or lung disease, weak immune system, etc.)     No 9. PREGNANCY: "Is there any chance you are pregnant?" "When was your last menstrual  period?"     n/a 10. OTHER SYMPTOMS: "Do you have any other symptoms?"  (e.g., runny nose, headache, sore throat, loss of smell)       No  Protocols used: CORONAVIRUS (COVID-19) DIAGNOSED OR SUSPECTED-A-AH

## 2018-07-16 ENCOUNTER — Other Ambulatory Visit: Payer: Self-pay

## 2018-07-16 ENCOUNTER — Ambulatory Visit (INDEPENDENT_AMBULATORY_CARE_PROVIDER_SITE_OTHER): Payer: Medicare Other | Admitting: Physician Assistant

## 2018-07-16 ENCOUNTER — Encounter: Payer: Self-pay | Admitting: Physician Assistant

## 2018-07-16 VITALS — Wt 215.0 lb

## 2018-07-16 DIAGNOSIS — Z20822 Contact with and (suspected) exposure to covid-19: Secondary | ICD-10-CM

## 2018-07-16 DIAGNOSIS — R6889 Other general symptoms and signs: Secondary | ICD-10-CM

## 2018-07-16 NOTE — Progress Notes (Signed)
Virtual Visit via Video   I connected with patient on 07/16/18 at  1:20 PM EDT by a video enabled telemedicine application and verified that I am speaking with the correct person using two identifiers.  Location patient: Home Location provider: [name], Office Persons participating in the virtual visit: Patient, Provider, [place] Reymundo Poll)  I discussed the limitations of evaluation and management by telemedicine and the availability of in person appointments. The patient expressed understanding and agreed to proceed.  Subjective:   HPI:   Patient presents via [facility] today for follow-up of suspected COVID 19. She was seen by me 2 weeks ago for mild viral symptoms that we discussed could potentially be COVID or another mild virus. Notes these symptoms got better after a few days but then she notes over the past few days having chills, aches, some chest tightness and fatigue. Denies chest pain, wheezing, fever. Denies sinus symptoms. Denies GI symptoms. Is eating well and hydrating.   ROS:   See pertinent positives and negatives per HPI.  Patient Active Problem List   Diagnosis Date Noted  . Achilles tendon contracture, right 04/18/2016  . OAB (overactive bladder) 02/28/2016  . Urgency incontinence 02/28/2016  . Situational anxiety 06/24/2015  . Mass of arm 04/09/2015  . Deviated septum 11/17/2014  . Gastroesophageal reflux disease without esophagitis 09/02/2014  . Chronic pain syndrome 05/24/2014  . Bereavement 03/13/2014  . Bipolar affective disorder, depressed, severe ([place]) 03/11/2014  . Opiate dependence ([place]) 03/11/2014  . Intentional opiate overdose ([place]) 03/11/2014  . De Quervain's tenosynovitis, left 12/12/2013  . Nausea with vomiting 07/28/2013  . Thrombocytopenia, unspecified ([place]) 07/28/2013  . External hemorrhoids 02/16/2013  . Metatarsalgia, right foot 05/18/2011  . Schizoaffective disorder, bipolar type ([place]) 03/31/2011    Social History    Tobacco Use  . Smoking status: Never Smoker  . Smokeless tobacco: Never Used  Substance Use Topics  . Alcohol use: No    Alcohol/week: 0.0 standard drinks    Current Outpatient Medications:  .  albuterol (PROVENTIL HFA;VENTOLIN HFA) 108 (90 Base) MCG/ACT inhaler, Inhale 1-2 puffs into the lungs every 6 (six) hours as needed for wheezing or shortness of breath., Disp: 1 Inhaler, Rfl: 0 .  busPIRone (BUSPAR) 15 MG tablet, Take 1 tablet (15 mg total) by mouth 2 (two) times daily., Disp: 60 tablet, Rfl: 1 .  gabapentin (NEURONTIN) 300 MG capsule, Take 300 mg by mouth daily. , Disp: , Rfl:  .  ketoconazole (NIZORAL) 2 % shampoo, Apply 1 application topically 2 (two) times a week., Disp: 120 mL, Rfl: 0 .  lithium carbonate 300 MG capsule, Take 2 capsules (600 mg total) by mouth at bedtime., Disp: 60 capsule, Rfl: 2 .  promethazine (PHENERGAN) 25 MG tablet, Take 1 tablet (25 mg total) by mouth every 6 (six) hours as needed for nausea or vomiting., Disp: 30 tablet, Rfl: 0 .  QUEtiapine (SEROQUEL) 400 MG tablet, Take 2 tablets (800 mg total) by mouth at bedtime. For mood control, Disp: 60 tablet, Rfl: 2 .  traMADol (ULTRAM) 50 MG tablet, TK 1 T PO Q 6 H PRN P, Disp: , Rfl:  .  traZODone (DESYREL) 100 MG tablet, Take 2 tablets (200 mg total) by mouth at bedtime., Disp: 60 tablet, Rfl: 2 .  zolpidem (AMBIEN) 10 MG tablet, Take 1 tablet (10 mg total) by mouth at bedtime. For sleep, Disp: 5 tablet, Rfl: 0  No Known Allergies  Objective:   Wt 215 lb (97.5 kg)  BMI 34.70 kg/m   Patient is well-developed, well-nourished in no acute distress.  Resting comfortably on bed at home.  Head is normocephalic, atraumatic.  No labored breathing or audible wheezing. Speech is clear and coherent with logical content.  Patient is alert and oriented at baseline.   Assessment and Plan:    1. Suspected Covid-19 Virus Infection 14 days of symptoms. Has improved but still some lingering chest tightness. Is  not using albuterol inhaler. No fever, chest pain, chest congestion, wheezing or GI symptoms. Is hydrating and resting. Will have her resume use of Albuterol inhaler as directed if needed. Supportive measures and OTC medications reviewed. She is now enrolled in our [facility] monitoring program so we can get daily updates on symptoms and resolution.    Leeanne Rio, PA-C 07/16/2018

## 2018-07-21 ENCOUNTER — Ambulatory Visit: Payer: Medicare Other | Admitting: Psychiatry

## 2018-08-01 ENCOUNTER — Other Ambulatory Visit: Payer: Self-pay

## 2018-08-01 ENCOUNTER — Encounter (HOSPITAL_COMMUNITY): Payer: Self-pay | Admitting: Emergency Medicine

## 2018-08-01 ENCOUNTER — Ambulatory Visit (HOSPITAL_COMMUNITY)
Admission: EM | Admit: 2018-08-01 | Discharge: 2018-08-01 | Disposition: A | Discharge status: Home / Self Care | Payer: Medicare Other | Attending: Emergency Medicine | Admitting: Emergency Medicine

## 2018-08-01 DIAGNOSIS — M5441 Lumbago with sciatica, right side: Secondary | ICD-10-CM | POA: Diagnosis not present

## 2018-08-01 MED ORDER — KETOROLAC TROMETHAMINE 60 MG/2ML IM SOLN
60.0000 mg | Freq: Once | INTRAMUSCULAR | Status: AC
  Administered 2018-08-01: 60 mg via INTRAMUSCULAR

## 2018-08-01 MED ORDER — METHYLPREDNISOLONE 4 MG PO TBPK: ORAL_TABLET | ORAL | 0 refills | Status: DC

## 2018-08-01 MED ORDER — KETOROLAC TROMETHAMINE 60 MG/2ML IM SOLN
INTRAMUSCULAR | Status: AC
  Filled 2018-08-01: qty 2

## 2018-08-01 MED ORDER — CYCLOBENZAPRINE HCL 5 MG PO TABS: 5.0000 mg | ORAL_TABLET | Freq: Every day | ORAL | 0 refills | Status: DC

## 2018-08-01 NOTE — ED Triage Notes (Signed)
Back pain for 2 days.  Patient denies any new injury.  Reports pain into both legs, R>L

## 2018-08-01 NOTE — ED Provider Notes (Signed)
[name]    CSN: [id number] Arrival date & time: 08/01/18  1648     History   Chief Complaint Chief Complaint  Patient presents with  . Back Pain    HPI [name] is a 53 y.o. female.   [name] presents with complaints of low back pain, R>L which radiates down right leg. No numbness, tingling or weakness. Started yesterday. States she has been working more recently, she is a Animal nutritionist. No other specific injury. States when she was in college she pinched a nerve in her back, however, and has had occasional flares since. Last episode has been years ago. No bladder or bowel incontinence. No saddle paresthesia. States she typically takes tramadol for this but doesn't have any currently. She has not taken any other medications for her symptoms. She is not diabetic. No known CKD. Hx of anxiety, bipolar, depression, migraines, substance abuse, chronic pain. It does look like she follows with pain management.    ROS per HPI, negative if not otherwise mentioned.      Past Medical History:  Diagnosis Date  . Anxiety   . Bipolar affective disorder, mixed, in partial or remission   . Depression   . Dysrhythmia    Left sided heart diease  . Headache(784.0)   . History of gallstones   . Kidney stones   . Mental disorder   . Migraine   . Pneumonia    Hx: of as a child  . PONV (postoperative nausea and vomiting)   . Renal disorder   . Sleep apnea    sleep test 2014, did not have osa  . Substance abuse (HCC)    Fentanyl, Percocet. last use 04/2011    Patient Active Problem List   Diagnosis Date Noted  . Achilles tendon contracture, right 04/18/2016  . OAB (overactive bladder) 02/28/2016  . Urgency incontinence 02/28/2016  . Situational anxiety 06/24/2015  . Mass of arm 04/09/2015  . Deviated septum 11/17/2014  . Gastroesophageal reflux disease without esophagitis 09/02/2014  . Chronic pain syndrome 05/24/2014  . Bereavement 03/13/2014  .  Bipolar affective disorder, depressed, severe ([place]) 03/11/2014  . Opiate dependence ([place]) 03/11/2014  . Intentional opiate overdose ([place]) 03/11/2014  . De Quervain's tenosynovitis, left 12/12/2013  . Nausea with vomiting 07/28/2013  . Thrombocytopenia, unspecified ([place]) 07/28/2013  . External hemorrhoids 02/16/2013  . Metatarsalgia, right foot 05/18/2011  . Schizoaffective disorder, bipolar type ([place]) 03/31/2011    Past Surgical History:  Procedure Laterality Date  . ABDOMINAL HYSTERECTOMY    . 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 BUNIONECTOMY     11/14/2011  . CHOLECYSTECTOMY    . COLONOSCOPY     2001, 2008.  Father has colon cancer  . FOOT SURGERY     right x 2  . NASAL SEPTOPLASTY W/ TURBINOPLASTY Bilateral 12/04/2014   Procedure: NASAL SEPTOPLASTY WITH BILATERAL TURBINATE REDUCTION;  Surgeon: Izora Gala, MD;  Location: [facility];  Service: ENT;  Laterality: Bilateral;  . TOTAL ABDOMINAL HYSTERECTOMY W/ BILATERAL SALPINGOOPHORECTOMY    . VAGINAL HYSTERECTOMY      OB History   No obstetric history on file.      Home Medications    Prior to Admission medications   Medication Sig Start Date End Date Taking? Authorizing Provider  albuterol (PROVENTIL  HFA;VENTOLIN HFA) 108 (90 Base) MCG/ACT inhaler Inhale 1-2 puffs into the lungs every 6 (six) hours as needed for wheezing or shortness of breath. 06/29/18   Brunetta Jeans, PA-C  busPIRone (BUSPAR) 15 MG tablet Take 1 tablet (15 mg total) by mouth 2 (two) times daily. 05/19/18   Shugart, Lissa Hoard, PA-C  cyclobenzaprine (FLEXERIL) 5 MG tablet Take 1 tablet (5 mg total) by mouth at bedtime. 08/01/18   Zigmund Gottron, NP  gabapentin (NEURONTIN) 300 MG capsule Take 300 mg by mouth daily.  09/11/17 09/11/18  [provider]  ketoconazole  (NIZORAL) 2 % shampoo Apply 1 application topically 2 (two) times a week. 04/29/18   Fatima Blank, MD  lithium carbonate 300 MG capsule Take 2 capsules (600 mg total) by mouth at bedtime. 05/19/18   Shugart, Lissa Hoard, PA-C  methylPREDNISolone (MEDROL DOSEPAK) 4 MG TBPK tablet Per box instructions 08/01/18   Zigmund Gottron, NP  promethazine (PHENERGAN) 25 MG tablet Take 1 tablet (25 mg total) by mouth every 6 (six) hours as needed for nausea or vomiting. 05/29/18   Gareth Morgan, MD  QUEtiapine (SEROQUEL) 400 MG tablet Take 2 tablets (800 mg total) by mouth at bedtime. For mood control 05/19/18   Shugart, Lissa Hoard, PA-C  traMADol (ULTRAM) 50 MG tablet TK 1 T PO Q 6 H PRN P 02/16/18   [provider]  traZODone (DESYREL) 100 MG tablet Take 2 tablets (200 mg total) by mouth at bedtime. 05/19/18   Shugart, Lissa Hoard, PA-C  zolpidem (AMBIEN) 10 MG tablet Take 1 tablet (10 mg total) by mouth at bedtime. For sleep 03/15/14 05/19/19  Encarnacion Slates, NP    Family History Family History  Problem Relation Age of Onset  . Colon cancer Father 42  . Breast cancer Father   . Breast cancer Mother   . Uterine cancer Mother   . Stomach cancer Neg Hx   . Rectal cancer Neg Hx   . Esophageal cancer Neg Hx     Social History Social History   Tobacco Use  . Smoking status: Never Smoker  . Smokeless tobacco: Never Used  Substance Use Topics  . Alcohol use: No    Alcohol/week: 0.0 standard drinks  . Drug use: No    Types: Other-see comments, Fentanyl, Hydrocodone     Allergies   Patient has no known allergies.   Review of Systems Review of Systems   Physical Exam Triage Vital Signs ED Triage Vitals  Enc Vitals Group     BP 08/01/18 1706 116/70     Pulse Rate 08/01/18 1706 98     Resp 08/01/18 1706 18     Temp 08/01/18 1706 98.8 F (37.1 C)     Temp Source 08/01/18 1706 Oral     SpO2 08/01/18 1706 95 %     Weight --      Height --      Head Circumference --      Peak Flow --      Pain  Score 08/01/18 1704 7     Pain Loc --      Pain Edu? --      Excl. in [place]? --    No data found.  Updated Vital Signs BP 116/70 (BP Location: Right Arm) Comment (BP Location): large cuff  Pulse 98   Temp 98.8 F (37.1 C) (Oral)   Resp 18   SpO2 95%    Physical Exam Constitutional:      General: She is  not in acute distress.    Appearance: She is well-developed.  Cardiovascular:     Rate and Rhythm: Normal rate and regular rhythm.     Heart sounds: Normal heart sounds.  Pulmonary:     Effort: Pulmonary effort is normal.     Breath sounds: Normal breath sounds.  Musculoskeletal:     Lumbar back: She exhibits tenderness, bony tenderness and pain. She exhibits normal range of motion, no swelling, no edema, no deformity, no laceration, no spasm and normal pulse.     Comments: Low midline back tenderness and mild right sided low back tenderness; strength equal bilaterally; gross sensation intact to lower extremities; pain with right straight leg raise; pain with transition from sit to lay and lay to sit; ambulatory without difficulty   Skin:    General: Skin is warm and dry.  Neurological:     Mental Status: She is alert and oriented to person, place, and time.      UC Treatments / Results  Labs (all labs ordered are listed, but only abnormal results are displayed) Labs Reviewed - No data to display  EKG None  Radiology No results found.  Procedures Procedures (including critical care time)  Medications Ordered in UC Medications  ketorolac (TORADOL) injection 60 mg (has no administration in time range)    Initial Impression / Assessment and Plan / UC Course  I have reviewed the triage vital signs and the nursing notes.  Pertinent labs & imaging results that were available during my care of the patient were reviewed by me and considered in my medical decision making (see chart for details).     No red flag findings today. Consistent with sciatica, patient is  familiar and expresses has had before. toradol provided with course of medrol. Per PMP tramadol refilled 4/27. Continue to follow with PCP and/or pain management as needed. Patient verbalized understanding and agreeable to plan.  Ambulatory out of clinic without difficulty.    Final Clinical Impressions(s) / UC Diagnoses   Final diagnoses:  Acute bilateral low back pain with right-sided sciatica     Discharge Instructions     Light and regular activity as tolerated.  Medrol dose pack to help with inflammation.  Flexeril at night as a muscle relaxer. May cause drowsiness. Please do not take if driving or drinking alcohol.   Follow up with your primary care provider as needed for persistent symptoms.    ED Prescriptions    Medication Sig Dispense Auth. Provider   methylPREDNISolone (MEDROL DOSEPAK) 4 MG TBPK tablet Per box instructions 21 tablet ,  B, NP   cyclobenzaprine (FLEXERIL) 5 MG tablet Take 1 tablet (5 mg total) by mouth at bedtime. 15 tablet Zigmund Gottron, NP     Controlled Substance Prescriptions [place] Controlled Substance Registry consulted? Yes, I have consulted the [place] Controlled Substances Registry for this patient, and feel the risk/benefit ratio today is favorable for proceeding with this prescription for a controlled substance.   Zigmund Gottron, NP 08/01/18 1728

## 2018-08-01 NOTE — Discharge Instructions (Addendum)
Light and regular activity as tolerated.  Medrol dose pack to help with inflammation.  Flexeril at night as a muscle relaxer. May cause drowsiness. Please do not take if driving or drinking alcohol.   Follow up with your primary care provider as needed for persistent symptoms.

## 2018-08-04 ENCOUNTER — Encounter: Payer: Self-pay | Admitting: Physician Assistant

## 2018-08-04 ENCOUNTER — Other Ambulatory Visit: Payer: Self-pay

## 2018-08-04 ENCOUNTER — Ambulatory Visit (INDEPENDENT_AMBULATORY_CARE_PROVIDER_SITE_OTHER): Payer: Medicare Other | Admitting: Physician Assistant

## 2018-08-04 VITALS — Temp 98.0°F | Ht 66.0 in | Wt 212.0 lb

## 2018-08-04 DIAGNOSIS — R197 Diarrhea, unspecified: Secondary | ICD-10-CM | POA: Diagnosis not present

## 2018-08-04 NOTE — Progress Notes (Signed)
I have discussed the procedure for the virtual visit with the patient who has given consent to proceed with assessment and treatment.   Adelbert Gaspard S [place], CMA     

## 2018-08-04 NOTE — Patient Instructions (Signed)
Instructions sent to MyChart.  Please have Eddie Dibbles come by to pick up stool kit. Can have him return to our office or the office at [place]. Keep hydrated and try to rest.  Tylenol if needed for fever greater than 101.  Can use your chronic medication if needed for nausea/vomiting. Start the dietary recommendations below.  ER for any worsening symptoms.    Food Choices to Help Relieve Diarrhea, Adult When you have diarrhea, the foods you eat and your eating habits are very important. Choosing the right foods and drinks can help:  Relieve diarrhea.  Replace lost fluids and nutrients.  Prevent dehydration. What general guidelines should I follow?  Relieving diarrhea  Choose foods with less than 2 g or .07 oz. of fiber per serving.  Limit fats to less than 8 tsp (38 g or 1.34 oz.) a day.  Avoid the following: ? Foods and beverages sweetened with high-fructose corn syrup, honey, or sugar alcohols such as xylitol, sorbitol, and mannitol. ? Foods that contain a lot of fat or sugar. ? Fried, greasy, or spicy foods. ? High-fiber grains, breads, and cereals. ? Raw fruits and vegetables.  Eat foods that are rich in probiotics. These foods include dairy products such as yogurt and fermented milk products. They help increase healthy bacteria in the stomach and intestines (gastrointestinal tract, or GI tract).  If you have lactose intolerance, avoid dairy products. These may make your diarrhea worse.  Take medicine to help stop diarrhea (antidiarrheal medicine) only as told by your health care provider. Replacing nutrients  Eat small meals or snacks every 3-4 hours.  Eat bland foods, such as white rice, toast, or baked potato, until your diarrhea starts to get better. Gradually reintroduce nutrient-rich foods as tolerated or as told by your health care provider. This includes: ? Well-cooked protein foods. ? Peeled, seeded, and soft-cooked fruits and vegetables. ? Low-fat dairy  products.  Take vitamin and mineral supplements as told by your health care provider. Preventing dehydration  Start by sipping water or a special solution to prevent dehydration (oral rehydration solution, ORS). Urine that is clear or pale yellow means that you are getting enough fluid.  Try to drink at least 8-10 cups of fluid each day to help replace lost fluids.  You may add other liquids in addition to water, such as clear juice or decaffeinated sports drinks, as tolerated or as told by your health care provider.  Avoid drinks with caffeine, such as coffee, tea, or soft drinks.  Avoid alcohol. What foods are recommended?     The items listed may not be a complete list. Talk with your health care provider about what dietary choices are best for you. Grains White rice. White, [place], or pita breads (fresh or toasted), including plain rolls, buns, or bagels. White pasta. Saltine, soda, or graham crackers. Pretzels. Low-fiber cereal. Cooked cereals made with water (such as cornmeal, farina, or cream cereals). Plain muffins. Matzo. Melba toast. Zwieback. Vegetables Potatoes (without the skin). Most well-cooked and canned vegetables without skins or seeds. Tender lettuce. Fruits Apple sauce. Fruits canned in juice. Cooked apricots, cherries, grapefruit, peaches, pears, or plums. Fresh bananas and cantaloupe. Meats and other protein foods Baked or boiled chicken. Eggs. Tofu. Fish. Seafood. Smooth nut butters. Ground or well-cooked tender beef, ham, veal, lamb, pork, or poultry. Dairy Plain yogurt, kefir, and unsweetened liquid yogurt. Lactose-free milk, buttermilk, skim milk, or soy milk. Low-fat or nonfat hard cheese. Beverages Water. Low-calorie sports drinks. Fruit juices without  pulp. Strained tomato and vegetable juices. Decaffeinated teas. Sugar-free beverages not sweetened with sugar alcohols. Oral rehydration solutions, if approved by your health care provider. Seasoning and other  foods Bouillon, broth, or soups made from recommended foods. What foods are not recommended? The items listed may not be a complete list. Talk with your health care provider about what dietary choices are best for you. Grains Whole grain, whole wheat, bran, or rye breads, rolls, pastas, and crackers. Wild or brown rice. Whole grain or bran cereals. Barley. Oats and oatmeal. Corn tortillas or taco shells. Granola. Popcorn. Vegetables Raw vegetables. Fried vegetables. Cabbage, broccoli, Brussels sprouts, artichokes, baked beans, beet greens, corn, kale, legumes, peas, sweet potatoes, and yams. Potato skins. Cooked spinach and cabbage. Fruits Dried fruit, including raisins and dates. Raw fruits. Stewed or dried prunes. Canned fruits with syrup. Meat and other protein foods Fried or fatty meats. Deli meats. Chunky nut butters. Nuts and seeds. Beans and lentils. Berniece Salines. Hot dogs. Sausage. Dairy High-fat cheeses. Whole milk, chocolate milk, and beverages made with milk, such as milk shakes. Half-and-half. Cream. sour cream. Ice cream. Beverages Caffeinated beverages (such as coffee, tea, soda, or energy drinks). Alcoholic beverages. Fruit juices with pulp. Prune juice. Soft drinks sweetened with high-fructose corn syrup or sugar alcohols. High-calorie sports drinks. Fats and oils Butter. Cream sauces. Margarine. Salad oils. Plain salad dressings. Olives. Avocados. Mayonnaise. Sweets and desserts Sweet rolls, doughnuts, and sweet breads. Sugar-free desserts sweetened with sugar alcohols such as xylitol and sorbitol. Seasoning and other foods Honey. Hot sauce. Chili powder. Gravy. Cream-based or milk-based soups. Pancakes and waffles. Summary  When you have diarrhea, the foods you eat and your eating habits are very important.  Make sure you get at least 8-10 cups of fluid each day, or enough to keep your urine clear or pale yellow.  Eat bland foods and gradually reintroduce healthy, nutrient-rich  foods as tolerated, or as told by your health care provider.  Avoid high-fiber, fried, greasy, or spicy foods. This information is not intended to replace advice given to you by your health care provider. Make sure you discuss any questions you have with your health care provider. Document Released: 05/24/2003 Document Revised: 02/29/2016 Document Reviewed: 02/29/2016 Elsevier Interactive Patient Education  2019 [employer].

## 2018-08-04 NOTE — Progress Notes (Signed)
Virtual Visit via Video   I connected with patient on 08/04/18 at 10:20 AM EDT by a video enabled telemedicine application and verified that I am speaking with the correct person using two identifiers.  Location patient: Home Location provider: [name], Office Persons participating in the virtual visit: Patient, Provider, [name] ([name])  I discussed the limitations of evaluation and management by telemedicine and the availability of in person appointments. The patient expressed understanding and agreed to proceed.  Subjective:   HPI:   Patient presents via Video Visit today c/o 3.5 days of frequent, loose, non- bloody stools. Denies nausea, vomiting, abdominal pain. Denies melena or hematochezia. Notes fever initially with Tmax of 100, mainly at night and hiding in the day. No fever since last night without medication. Denies chest pain or SOB. Does note some weakness. Is trying to hydrate better. Was diagnosed last month with presumed COVID infection with complete resolution of symptoms. No testing done at that time as we were not testing patents with mild symptoms. Denies recent travel or sick contact. Husband feeling just fine. Denies any abnormal food or water source. Was on Doxycycline recently. Was also seen at UC 2 days ago for sciatica. Was placed on prednisone which she is not taking. Notes she was having these symptoms at that time but did not mention to provider. On review of that OV, vitals stable with temperature at 98.8.  ROS:   See pertinent positives and negatives per HPI.  Patient Active Problem List   Diagnosis Date Noted  . Achilles tendon contracture, right 04/18/2016  . OAB (overactive bladder) 02/28/2016  . Urgency incontinence 02/28/2016  . Situational anxiety 06/24/2015  . Mass of arm 04/09/2015  . Deviated septum 11/17/2014  . Gastroesophageal reflux disease without esophagitis 09/02/2014  . Chronic pain syndrome 05/24/2014  . Bereavement  03/13/2014  . Bipolar affective disorder, depressed, severe ([place]) 03/11/2014  . Opiate dependence ([place]) 03/11/2014  . Intentional opiate overdose ([place]) 03/11/2014  . De Quervain's tenosynovitis, left 12/12/2013  . Nausea with vomiting 07/28/2013  . Thrombocytopenia, unspecified ([place]) 07/28/2013  . External hemorrhoids 02/16/2013  . Metatarsalgia, right foot 05/18/2011  . Schizoaffective disorder, bipolar type ([place]) 03/31/2011    Social History   Tobacco Use  . Smoking status: Never Smoker  . Smokeless tobacco: Never Used  Substance Use Topics  . Alcohol use: No    Alcohol/week: 0.0 standard drinks    Current Outpatient Medications:  .  albuterol (PROVENTIL HFA;VENTOLIN HFA) 108 (90 Base) MCG/ACT inhaler, Inhale 1-2 puffs into the lungs every 6 (six) hours as needed for wheezing or shortness of breath., Disp: 1 Inhaler, Rfl: 0 .  busPIRone (BUSPAR) 15 MG tablet, Take 1 tablet (15 mg total) by mouth 2 (two) times daily., Disp: 60 tablet, Rfl: 1 .  cyclobenzaprine (FLEXERIL) 5 MG tablet, Take 1 tablet (5 mg total) by mouth at bedtime., Disp: 15 tablet, Rfl: 0 .  gabapentin (NEURONTIN) 300 MG capsule, Take 300 mg by mouth daily. , Disp: , Rfl:  .  ketoconazole (NIZORAL) 2 % shampoo, Apply 1 application topically 2 (two) times a week., Disp: 120 mL, Rfl: 0 .  lithium carbonate 300 MG capsule, Take 2 capsules (600 mg total) by mouth at bedtime., Disp: 60 capsule, Rfl: 2 .  methylPREDNISolone (MEDROL DOSEPAK) 4 MG TBPK tablet, Per box instructions, Disp: 21 tablet, Rfl: 0 .  promethazine (PHENERGAN) 25 MG tablet, Take 1 tablet (25 mg total) by mouth every 6 (six) hours as needed  for nausea or vomiting., Disp: 30 tablet, Rfl: 0 .  QUEtiapine (SEROQUEL) 400 MG tablet, Take 2 tablets (800 mg total) by mouth at bedtime. For mood control, Disp: 60 tablet, Rfl: 2 .  traMADol (ULTRAM) 50 MG tablet, TK 1 T PO Q 6 H PRN P, Disp: , Rfl:  .  traZODone (DESYREL) 100 MG tablet, Take 2 tablets (200 mg  total) by mouth at bedtime., Disp: 60 tablet, Rfl: 2 .  zolpidem (AMBIEN) 10 MG tablet, Take 1 tablet (10 mg total) by mouth at bedtime. For sleep, Disp: 5 tablet, Rfl: 0  No Known Allergies  Objective:   There were no vitals taken for this visit.  Patient is well-developed, well-nourished in no acute distress.  Resting comfortably on bed at home.  Head is normocephalic, atraumatic.  No labored breathing.  Speech is clear and coherent with logical content.  Patient is alert and oriented at baseline.    Assessment and Plan:   1. Diarrhea of presumed infectious origin Suspect mild viral GE. COVID cannot be ruled out but she was suspected to have COVID last month due to abrupt onset of significant respiratory symptoms without other cause. Those have completely resolved. Mild symptoms presently and afebrile. She is to remain home until symptoms are resolved. Will write work note. Hydration and BRAT diet reviewed. Antiemetic (has a prescription) as directed if needed for nausea or vomiting. Will check stool studies today due to recent antbiotic use to r/o c diff or other bacterial cause of symptoms. Strict ER precautions reviewed with patient. Husband is coming by to pick up stool kit.   - Stool Culture - Clostridium difficile Toxin B, Qualitative, Real-Time PCR([facility]) .   Leeanne Rio, PA-C 08/04/2018

## 2018-08-08 ENCOUNTER — Other Ambulatory Visit: Payer: Self-pay | Admitting: Physician Assistant

## 2018-08-11 ENCOUNTER — Encounter: Payer: Self-pay | Admitting: Emergency Medicine

## 2018-08-26 ENCOUNTER — Other Ambulatory Visit: Payer: Self-pay

## 2018-08-26 MED ORDER — TRAZODONE HCL 100 MG PO TABS: 200.0000 mg | ORAL_TABLET | Freq: Every day | ORAL | 0 refills | Status: DC

## 2018-08-26 MED ORDER — QUETIAPINE FUMARATE 400 MG PO TABS: 800.0000 mg | ORAL_TABLET | Freq: Every day | ORAL | 0 refills | Status: DC

## 2018-08-26 MED ORDER — LITHIUM CARBONATE 300 MG PO CAPS: 600.0000 mg | ORAL_CAPSULE | Freq: Every day | ORAL | 0 refills | Status: DC

## 2018-08-27 ENCOUNTER — Telehealth: Payer: Self-pay | Admitting: Psychiatry

## 2018-08-27 ENCOUNTER — Other Ambulatory Visit: Payer: Self-pay

## 2018-08-27 MED ORDER — ZOLPIDEM TARTRATE 10 MG PO TABS: 10.0000 mg | ORAL_TABLET | Freq: Every day | ORAL | 0 refills | Status: DC

## 2018-08-27 NOTE — Telephone Encounter (Signed)
Last fill 05/17 #30, will send in refill for 1 month

## 2018-08-27 NOTE — Telephone Encounter (Signed)
Previous patient of Clay's stated she will run out of Ambien before her appointment with Helene Kelp on 06/23 please send refill to [facility] on [address]

## 2018-09-07 ENCOUNTER — Ambulatory Visit: Payer: Medicare Other | Admitting: Physician Assistant

## 2018-09-07 ENCOUNTER — Encounter: Payer: Self-pay | Admitting: Physician Assistant

## 2018-09-07 ENCOUNTER — Other Ambulatory Visit: Payer: Self-pay

## 2018-09-07 DIAGNOSIS — F314 Bipolar disorder, current episode depressed, severe, without psychotic features: Secondary | ICD-10-CM | POA: Diagnosis not present

## 2018-09-07 DIAGNOSIS — F411 Generalized anxiety disorder: Secondary | ICD-10-CM

## 2018-09-07 DIAGNOSIS — Z79899 Other long term (current) drug therapy: Secondary | ICD-10-CM

## 2018-09-07 MED ORDER — BUSPIRONE HCL 15 MG PO TABS: 15.0000 mg | ORAL_TABLET | Freq: Every day | ORAL | 0 refills | Status: DC

## 2018-09-07 MED ORDER — LITHIUM CARBONATE 300 MG PO CAPS: 600.0000 mg | ORAL_CAPSULE | Freq: Every day | ORAL | 0 refills | Status: DC

## 2018-09-07 MED ORDER — ZOLPIDEM TARTRATE 10 MG PO TABS: 10.0000 mg | ORAL_TABLET | Freq: Every day | ORAL | 2 refills | Status: DC

## 2018-09-07 MED ORDER — TRAZODONE HCL 100 MG PO TABS: 200.0000 mg | ORAL_TABLET | Freq: Every day | ORAL | 0 refills | Status: DC

## 2018-09-07 MED ORDER — QUETIAPINE FUMARATE 400 MG PO TABS: 800.0000 mg | ORAL_TABLET | Freq: Every day | ORAL | 0 refills | Status: DC

## 2018-09-07 MED ORDER — GABAPENTIN 300 MG PO CAPS: 300.0000 mg | ORAL_CAPSULE | Freq: Two times a day (BID) | ORAL | 0 refills | Status: DC

## 2018-09-07 NOTE — Progress Notes (Addendum)
[facility] Med Check  Patient ID: [name],  MRN: [id number]  PCP: Brunetta Jeans, PA-C  Date of Evaluation: 09/07/2018 Time spent:15 minutes  Chief Complaint:  Chief Complaint    Follow-up      HISTORY/CURRENT STATUS: HPI for routine med check.  Transferring to my care after her provider and my colleague, [place], [place] passed away recently.  Feels that overall she is doing pretty well.  Anxiety is better controlled on the BuSpar.  She sleeps well.  Patient denies loss of interest in usual activities and is able to enjoy things.  Denies decreased energy or motivation.  Appetite has not changed.  No extreme sadness, tearfulness, or feelings of hopelessness.  Denies any changes in concentration, making decisions or remembering things.  Denies suicidal or homicidal thoughts.  Patient denies increased energy with decreased need for sleep, no increased talkativeness, no racing thoughts, no impulsivity or risky behaviors, no increased spending, no increased libido, no grandiosity.  Denies hallucinations.  Denies dizziness, syncope, seizures, numbness, tingling, tremor, tics, unsteady gait, slurred speech, confusion. Denies muscle or joint pain, stiffness, or dystonia.  Individual Medical History/ Review of Systems: Changes? :No    Past medications for mental health diagnoses include: Depakote, Tegretol, BuSpar  Allergies: Patient has no known allergies.  Current Medications:  Current Outpatient Medications:  .  albuterol (VENTOLIN HFA) 108 (90 Base) MCG/ACT inhaler, INHALE 1 TO 2 PUFFS INTO THE LUNGS EVERY 6 HOURS AS NEEDED FOR WHEEZING OR SHORTNESS OF BREATH, Disp: 18 g, Rfl: 3 .  busPIRone (BUSPAR) 15 MG tablet, Take 1 tablet (15 mg total) by mouth at bedtime., Disp: 90 tablet, Rfl: 0 .  gabapentin (NEURONTIN) 300 MG capsule, Take 1 capsule (300 mg total) by mouth 2 (two) times daily., Disp: 180 capsule, Rfl: 0 .  lithium carbonate 300 MG capsule, Take 2 capsules  (600 mg total) by mouth at bedtime., Disp: 180 capsule, Rfl: 0 .  promethazine (PHENERGAN) 25 MG tablet, Take 1 tablet (25 mg total) by mouth every 6 (six) hours as needed for nausea or vomiting., Disp: 30 tablet, Rfl: 0 .  QUEtiapine (SEROQUEL) 400 MG tablet, Take 2 tablets (800 mg total) by mouth at bedtime. For mood control, Disp: 180 tablet, Rfl: 0 .  traMADol (ULTRAM) 50 MG tablet, TK 1 T PO Q 6 H PRN P, Disp: , Rfl:  .  traZODone (DESYREL) 100 MG tablet, Take 2 tablets (200 mg total) by mouth at bedtime., Disp: 180 tablet, Rfl: 0 .  zolpidem (AMBIEN) 10 MG tablet, Take 1 tablet (10 mg total) by mouth at bedtime. For sleep, Disp: 30 tablet, Rfl: 2 .  cyclobenzaprine (FLEXERIL) 5 MG tablet, Take 1 tablet (5 mg total) by mouth at bedtime. (Patient not taking: Reported on 09/07/2018), Disp: 15 tablet, Rfl: 0 .  ketoconazole (NIZORAL) 2 % shampoo, Apply 1 application topically 2 (two) times a week. (Patient not taking: Reported on 09/07/2018), Disp: 120 mL, Rfl: 0 Medication Side Effects: none  Family Medical/ Social History: Changes? Yes out of work now b/c of coronavirus.  She and husband are planning to move back to [place] asap.   MENTAL HEALTH EXAM:  There were no vitals taken for this visit.There is no height or weight on file to calculate BMI.  General Appearance: Casual  Eye Contact:  Good  Speech:  Clear and Coherent  Volume:  Normal  Mood:  Euthymic  Affect:  Appropriate  Thought Process:  Goal Directed  Orientation:  Full (Time,  Place, and Person)  Thought Content: Logical   Suicidal Thoughts:  No  Homicidal Thoughts:  No  Memory:  WNL  Judgement:  Good  Insight:  Good  Psychomotor Activity:  Normal  Concentration:  Concentration: Good  Recall:  Good  Fund of Knowledge: Good  Language: Good  Assets:  Desire for Improvement  ADL's:  Intact  Cognition: WNL  Prognosis:  Good    DIAGNOSES:    ICD-10-CM   1. Encounter for long-term (current) use of medications  Z79.899  Lithium level    Basic metabolic panel    TSH  2. Bipolar affective disorder, depressed, severe ([place])  F31.4   3. Generalized anxiety disorder  F41.1     Receiving Psychotherapy: No    RECOMMENDATIONS:  Continue BuSpar 15 mg nightly.  (She has not been able to remember twice daily and this seems to be working well.) Continue gabapentin 300 mg 1 twice daily. Continue lithium 600 mg nightly. Continue Seroquel 800 mg nightly. Continue Ambien 10 mg nightly as needed. Obtain labs as above. Return in 3 months.  If she has already moved to [place] by that time then see a physician there.  I will make sure she has enough medications for a total of 6 months from now if she is not able to get back in.  She tells me that she will return every 3 to 4 months anyway to visit family and should be able to keep appointments here.  Donnal Moat, PA-C   This record has been created using [employer].  Chart creation errors have been sought, but may not always have been located and corrected. Such creation errors do not reflect on the standard of medical care.

## 2018-09-29 ENCOUNTER — Telehealth: Payer: Self-pay | Admitting: Physician Assistant

## 2018-09-29 NOTE — Telephone Encounter (Signed)
[name] called requesting a 90 day refill of her Ambien.  She is going to [place] and needs more than 30 day supply.  She also said that [facility] won't take the prescription from a PA it has to be a dr.  Please change her ambien rx from 30 day to 77.  [place]

## 2018-09-30 ENCOUNTER — Telehealth: Payer: Self-pay | Admitting: Physician Assistant

## 2018-09-30 NOTE — Telephone Encounter (Signed)
Pt requesting a refill on her Ambien. She states it was supposed to be for 90 days . Would like this filled at the [facility] on [address].Stated she is headed to [place] ,and called about this request on yesterday.

## 2018-10-01 ENCOUNTER — Other Ambulatory Visit: Payer: Self-pay

## 2018-10-01 MED ORDER — ZOLPIDEM TARTRATE 10 MG PO TABS: 10.0000 mg | ORAL_TABLET | Freq: Every day | ORAL | 0 refills | Status: DC

## 2018-10-01 NOTE — Telephone Encounter (Signed)
Called in.

## 2018-10-01 NOTE — Telephone Encounter (Signed)
Yes, no RF.  Thanks

## 2018-10-01 NOTE — Telephone Encounter (Signed)
90 day called in

## 2018-10-06 ENCOUNTER — Emergency Department (HOSPITAL_COMMUNITY): Payer: Medicare Other

## 2018-10-06 ENCOUNTER — Encounter (HOSPITAL_COMMUNITY): Payer: Self-pay | Admitting: Obstetrics and Gynecology

## 2018-10-06 ENCOUNTER — Emergency Department (HOSPITAL_COMMUNITY)
Admission: EM | Admit: 2018-10-06 | Discharge: 2018-10-06 | Disposition: A | Discharge status: Home / Self Care | Payer: Medicare Other | Attending: Emergency Medicine | Admitting: Emergency Medicine

## 2018-10-06 ENCOUNTER — Other Ambulatory Visit: Payer: Self-pay

## 2018-10-06 DIAGNOSIS — M5441 Lumbago with sciatica, right side: Secondary | ICD-10-CM

## 2018-10-06 DIAGNOSIS — M545 Low back pain: Secondary | ICD-10-CM | POA: Insufficient documentation

## 2018-10-06 DIAGNOSIS — Z79899 Other long term (current) drug therapy: Secondary | ICD-10-CM | POA: Diagnosis not present

## 2018-10-06 MED ORDER — OXYCODONE-ACETAMINOPHEN 5-325 MG PO TABS: 1.0000 | ORAL_TABLET | ORAL | 0 refills | Status: DC | PRN

## 2018-10-06 MED ORDER — DICLOFENAC SODIUM 50 MG PO TBEC: 50.0000 mg | DELAYED_RELEASE_TABLET | Freq: Two times a day (BID) | ORAL | 0 refills | Status: DC

## 2018-10-06 MED ORDER — PREDNISONE 10 MG PO TABS: ORAL_TABLET | ORAL | 0 refills | Status: DC

## 2018-10-06 MED ORDER — HYDROMORPHONE HCL 1 MG/ML IJ SOLN
1.0000 mg | Freq: Once | INTRAMUSCULAR | Status: AC
  Administered 2018-10-06: 1 mg via INTRAMUSCULAR
  Filled 2018-10-06: qty 1

## 2018-10-06 NOTE — ED Triage Notes (Signed)
Pt reports she went to lift something on the 15th and felt an electric shock down her legs. Pt reports severe back and leg pain since

## 2018-10-06 NOTE — ED Provider Notes (Addendum)
[facility] DEPT Provider Note   CSN: [id number] Arrival date & time: 10/06/18  1202    History   Chief Complaint Chief Complaint  Patient presents with  . Back Pain    HPI [name] PIZANA is a 53 y.o. female.     Lower back pain with radiation to the right lower extremity for 1 week after bending and lifting.  Pain feels like electric shock.  She has a history of sciatica.  No bowel or bladder incontinence.  Severity is moderate.  Palpation and positioning make pain worse.     Past Medical History:  Diagnosis Date  . Anxiety   . Bipolar affective disorder, mixed, in partial or remission   . Depression   . Dysrhythmia    Left sided heart diease  . Headache(784.0)   . History of gallstones   . Kidney stones   . Mental disorder   . Migraine   . Pneumonia    Hx: of as a child  . PONV (postoperative nausea and vomiting)   . Renal disorder   . Sleep apnea    sleep test 2014, did not have osa  . Substance abuse (HCC)    Fentanyl, Percocet. last use 04/2011    Patient Active Problem List   Diagnosis Date Noted  . Achilles tendon contracture, right 04/18/2016  . OAB (overactive bladder) 02/28/2016  . Urgency incontinence 02/28/2016  . Situational anxiety 06/24/2015  . Mass of arm 04/09/2015  . Deviated septum 11/17/2014  . Gastroesophageal reflux disease without esophagitis 09/02/2014  . Chronic pain syndrome 05/24/2014  . Bereavement 03/13/2014  . Bipolar affective disorder, depressed, severe ([place]) 03/11/2014  . Opiate dependence ([place]) 03/11/2014  . Intentional opiate overdose ([place]) 03/11/2014  . De Quervain's tenosynovitis, left 12/12/2013  . Nausea with vomiting 07/28/2013  . Thrombocytopenia, unspecified ([place]) 07/28/2013  . External hemorrhoids 02/16/2013  . Metatarsalgia, right foot 05/18/2011  . Schizoaffective disorder, bipolar type ([place]) 03/31/2011    Past Surgical History:  Procedure Laterality Date  . ABDOMINAL  HYSTERECTOMY    . 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 BUNIONECTOMY     11/14/2011  . CHOLECYSTECTOMY    . COLONOSCOPY     2001, 2008.  Father has colon cancer  . FOOT SURGERY     right x 2  . NASAL SEPTOPLASTY W/ TURBINOPLASTY Bilateral 12/04/2014   Procedure: NASAL SEPTOPLASTY WITH BILATERAL TURBINATE REDUCTION;  Surgeon: Izora Gala, MD;  Location: [facility];  Service: ENT;  Laterality: Bilateral;  . TOTAL ABDOMINAL HYSTERECTOMY W/ BILATERAL SALPINGOOPHORECTOMY    . VAGINAL HYSTERECTOMY       OB History    Gravida      Para      Term      Preterm      AB      Living  6     SAB      TAB      Ectopic      Multiple      Live Births               Home Medications    Prior to Admission medications   Medication Sig Start Date End Date Taking? Authorizing Provider  albuterol (VENTOLIN HFA) 108 (90 Base) MCG/ACT inhaler INHALE 1 TO 2 PUFFS INTO THE  LUNGS EVERY 6 HOURS AS NEEDED FOR WHEEZING OR SHORTNESS OF BREATH 08/10/18   Brunetta Jeans, PA-C  busPIRone (BUSPAR) 15 MG tablet Take 1 tablet (15 mg total) by mouth at bedtime. 09/07/18   Donnal Moat T, PA-C  cyclobenzaprine (FLEXERIL) 5 MG tablet Take 1 tablet (5 mg total) by mouth at bedtime. Patient not taking: Reported on 09/07/2018 08/01/18   Augusto Gamble B, NP  diclofenac (VOLTAREN) 50 MG EC tablet Take 1 tablet (50 mg total) by mouth 2 (two) times daily. 10/06/18   Nat Christen, MD  gabapentin (NEURONTIN) 300 MG capsule Take 1 capsule (300 mg total) by mouth 2 (two) times daily. 09/07/18 09/07/19  Donnal Moat T, PA-C  ketoconazole (NIZORAL) 2 % shampoo Apply 1 application topically 2 (two) times a week. Patient not taking: Reported on 09/07/2018 04/29/18   Fatima Blank, MD  lithium carbonate 300 MG capsule Take 2  capsules (600 mg total) by mouth at bedtime. 09/07/18   Addison Lank, PA-C  oxyCODONE-acetaminophen (PERCOCET) 5-325 MG tablet Take 1 tablet by mouth every 4 (four) hours as needed. 10/06/18   Nat Christen, MD  predniSONE (DELTASONE) 10 MG tablet 3 tablets for 3 days, 2 tablets for 3 days, 1 tablet for 3 days 10/06/18   Nat Christen, MD  promethazine (PHENERGAN) 25 MG tablet Take 1 tablet (25 mg total) by mouth every 6 (six) hours as needed for nausea or vomiting. 05/29/18   Gareth Morgan, MD  QUEtiapine (SEROQUEL) 400 MG tablet Take 2 tablets (800 mg total) by mouth at bedtime. For mood control 09/07/18   Donnal Moat T, PA-C  traMADol (ULTRAM) 50 MG tablet TK 1 T PO Q 6 H PRN P 02/16/18   [provider]  traZODone (DESYREL) 100 MG tablet Take 2 tablets (200 mg total) by mouth at bedtime. 09/07/18   Donnal Moat T, PA-C  zolpidem (AMBIEN) 10 MG tablet Take 1 tablet (10 mg total) by mouth at bedtime. For sleep 10/01/18 12/30/18  Addison Lank PA-C    Family History Family History  Problem Relation Age of Onset  . Colon cancer Father 36  . Breast cancer Father   . Breast cancer Mother   . Uterine cancer Mother   . Stomach cancer Neg Hx   . Rectal cancer Neg Hx   . Esophageal cancer Neg Hx     Social History Social History   Tobacco Use  . Smoking status: Never Smoker  . Smokeless tobacco: Never Used  Substance Use Topics  . Alcohol use: No    Alcohol/week: 0.0 standard drinks  . Drug use: No    Types: Other-see comments, Fentanyl, Hydrocodone     Allergies   Patient has no known allergies.   Review of Systems Review of Systems  All other systems reviewed and are negative.    Physical Exam Updated Vital Signs BP (!) 147/89 (BP Location: Left Arm)   Pulse 87   Temp 98.2 F (36.8 C) (Oral)   Resp 15   Ht 5\' 6"  (1.676 m)   Wt 97.5 kg   SpO2 98%   BMI 34.70 kg/m   Physical Exam Vitals signs and nursing note reviewed.  Constitutional:      Appearance:  She is well-developed.  HENT:     Head: Normocephalic and atraumatic.  Eyes:     Conjunctiva/sclera: Conjunctivae normal.  Neck:     Musculoskeletal: Neck supple.  Cardiovascular:     Rate and Rhythm: Normal rate and  regular rhythm.  Pulmonary:     Effort: Pulmonary effort is normal.     Breath sounds: Normal breath sounds.  Abdominal:     General: Bowel sounds are normal.     Palpations: Abdomen is soft.  Musculoskeletal:     Comments: Minimal tenderness right lower back.  Pain with straight leg raise right greater than left.  Skin:    General: Skin is warm and dry.  Neurological:     Mental Status: She is alert and oriented to person, place, and time.  Psychiatric:        Behavior: Behavior normal.      ED Treatments / Results  Labs (all labs ordered are listed, but only abnormal results are displayed) Labs Reviewed - No data to display  EKG None  Radiology Dg Lumbar Spine Complete  Result Date: 10/06/2018 CLINICAL DATA:  Shock feeling down both legs after trying to lift something 1 week ago. Low back pain right greater than left. EXAM: LUMBAR SPINE - COMPLETE 4+ VIEW COMPARISON:  CT 03/15/2016 and KUB 09/27/2014 FINDINGS: Vertebral body alignment and heights are normal. There is mild spondylosis of the lumbar spine to include facet arthropathy. Subtle disc space narrowing at the L4-5 level unchanged. No compression fracture or spondylolisthesis. Surgical clips over the right upper quadrant. Minimal degenerative change over the sacroiliac joints. Pelvic phleboliths. IMPRESSION: Mild spondylosis of the lumbar spine with subtle disc space narrowing at the L4-5 level. Electronically Signed   By: Marin Olp M.D.   On: 10/06/2018 14:20    Procedures Procedures (including critical care time)  Medications Ordered in ED Medications  HYDROmorphone (DILAUDID) injection 1 mg (1 mg Intramuscular Given 10/06/18 1333)     Initial Impression / Assessment and Plan / ED Course   I have reviewed the triage vital signs and the nursing notes.  Pertinent labs & imaging results that were available during my care of the patient were reviewed by me and considered in my medical decision making (see chart for details).       History and physical consistent with sciatica.  Could be a HNP.  Dilaudid 1 mg IM.  Plain films of lumbar spine.  Plain films of lumbar spine reveal disc narrowing between L4 and L5.  This correlates with her radicular pain to the right leg.  Patient understands that if symptoms persist, she will need an MRI.  Discharge medication Percocet, prednisone, diclofenac 50 mg.  Final Clinical Impressions(s) / ED Diagnoses   Final diagnoses:  Acute low back pain with right-sided sciatica, unspecified back pain laterality    ED Discharge Orders         Ordered    oxyCODONE-acetaminophen (PERCOCET) 5-325 MG tablet  Every 4 hours PRN     10/06/18 1521    predniSONE (DELTASONE) 10 MG tablet     10/06/18 1521    diclofenac (VOLTAREN) 50 MG EC tablet  2 times daily     10/06/18 1521           Nat Christen, MD 10/06/18 1330    Nat Christen, MD 10/06/18 [phone]

## 2018-10-06 NOTE — Discharge Instructions (Addendum)
X-ray showed a narrowing between your L4 and L5 vertebrae.  This narrowing could be putting pressure on the nerve going down your right leg.  Prescription for pain medicine, prednisone, anti-inflammatory medication.  Ice pack.  Follow-up with your primary care doctor.  If symptoms do not improve, recommend an MRI.

## 2018-10-15 ENCOUNTER — Encounter: Payer: Self-pay | Admitting: Gastroenterology

## 2018-10-19 ENCOUNTER — Other Ambulatory Visit: Payer: Self-pay

## 2018-10-19 ENCOUNTER — Encounter (HOSPITAL_COMMUNITY): Payer: Self-pay

## 2018-10-19 ENCOUNTER — Emergency Department (HOSPITAL_COMMUNITY)
Admission: EM | Admit: 2018-10-19 | Discharge: 2018-10-19 | Disposition: A | Discharge status: Home / Self Care | Payer: Medicare Other | Attending: Emergency Medicine | Admitting: Emergency Medicine

## 2018-10-19 DIAGNOSIS — M545 Low back pain: Secondary | ICD-10-CM | POA: Diagnosis present

## 2018-10-19 DIAGNOSIS — M5441 Lumbago with sciatica, right side: Secondary | ICD-10-CM | POA: Diagnosis not present

## 2018-10-19 MED ORDER — OXYCODONE-ACETAMINOPHEN 5-325 MG PO TABS: 1.0000 | ORAL_TABLET | Freq: Four times a day (QID) | ORAL | 0 refills | Status: DC | PRN

## 2018-10-19 NOTE — Discharge Instructions (Signed)
You were seen in the ED today for unchanged back pain pending an MRI scheduled for 08/20 I have written you a very short course of pain medication but ultimately you will need to follow up with your pain clinic or orthopedist regarding your pain

## 2018-10-19 NOTE — ED Provider Notes (Signed)
[facility] DEPT Provider Note   CSN: [id number] Arrival date & time: 10/19/18  1243    History   Chief Complaint Chief Complaint  Patient presents with  . Back Pain    HPI [name] is a 53 y.o. female who presents to the ED today for unchanged back pain x 3 weeks. Pt was seen in the ED on 07/22 for back pain after bending and lifting an object. DG L spine obtained which showed narrowing of the disc between L4 and L5. Pt was given prednisone, short course of pain medication, and diclofenac. She was advised to follow up with orthopedics whom she saw a couple of days later. They recommended continuation of prednisone until finished and ordered an MRI given sciatic type symptoms with positive SLR. MRI is scheduled for 08/20. Pt reports that after running out of her pain medication that her symptoms have returned. She is followed by pain management and is given Tramadol 50 mg which she states is not touching the back. Pt came to the ED hoping she could have an MRI sooner as her orthopedist is currently on vacation and she could not follow up with him. No new injury since being seen 3 weeks ago. Denies fever, chills, urinary retention, urinary or bowel incontinence, saddle anesthesia, or any other associated symptoms.        Past Medical History:  Diagnosis Date  . Anxiety   . Bipolar affective disorder, mixed, in partial or remission   . Depression   . Dysrhythmia    Left sided heart diease  . Headache(784.0)   . History of gallstones   . Kidney stones   . Mental disorder   . Migraine   . Pneumonia    Hx: of as a child  . PONV (postoperative nausea and vomiting)   . Renal disorder   . Sleep apnea    sleep test 2014, did not have osa  . Substance abuse (HCC)    Fentanyl, Percocet. last use 04/2011    Patient Active Problem List   Diagnosis Date Noted  . Achilles tendon contracture, right 04/18/2016  . OAB (overactive bladder) 02/28/2016  .  Urgency incontinence 02/28/2016  . Situational anxiety 06/24/2015  . Mass of arm 04/09/2015  . Deviated septum 11/17/2014  . Gastroesophageal reflux disease without esophagitis 09/02/2014  . Chronic pain syndrome 05/24/2014  . Bereavement 03/13/2014  . Bipolar affective disorder, depressed, severe ([place]) 03/11/2014  . Opiate dependence ([place]) 03/11/2014  . Intentional opiate overdose ([place]) 03/11/2014  . De Quervain's tenosynovitis, left 12/12/2013  . Nausea with vomiting 07/28/2013  . Thrombocytopenia, unspecified ([place]) 07/28/2013  . External hemorrhoids 02/16/2013  . Metatarsalgia, right foot 05/18/2011  . Schizoaffective disorder, bipolar type ([place]) 03/31/2011    Past Surgical History:  Procedure Laterality Date  . ABDOMINAL HYSTERECTOMY    . 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 BUNIONECTOMY     11/14/2011  . CHOLECYSTECTOMY    . COLONOSCOPY     2001, 2008.  Father has colon cancer  . FOOT SURGERY     right x 2  . NASAL SEPTOPLASTY W/ TURBINOPLASTY Bilateral 12/04/2014   Procedure: NASAL SEPTOPLASTY WITH BILATERAL TURBINATE REDUCTION;  Surgeon: Izora Gala, MD;  Location: [facility];  Service: ENT;  Laterality: Bilateral;  . TOTAL ABDOMINAL HYSTERECTOMY  W/ BILATERAL SALPINGOOPHORECTOMY    . VAGINAL HYSTERECTOMY       OB History    Gravida      Para      Term      Preterm      AB      Living  6     SAB      TAB      Ectopic      Multiple      Live Births               Home Medications    Prior to Admission medications   Medication Sig Start Date End Date Taking? Authorizing Provider  albuterol (VENTOLIN HFA) 108 (90 Base) MCG/ACT inhaler INHALE 1 TO 2 PUFFS INTO THE LUNGS EVERY 6 HOURS AS NEEDED FOR WHEEZING OR SHORTNESS OF BREATH 08/10/18   Brunetta Jeans,  PA-C  busPIRone (BUSPAR) 15 MG tablet Take 1 tablet (15 mg total) by mouth at bedtime. 09/07/18   Donnal Moat T, PA-C  cyclobenzaprine (FLEXERIL) 5 MG tablet Take 1 tablet (5 mg total) by mouth at bedtime. Patient not taking: Reported on 09/07/2018 08/01/18   Augusto Gamble B, NP  diclofenac (VOLTAREN) 50 MG EC tablet Take 1 tablet (50 mg total) by mouth 2 (two) times daily. 10/06/18   Nat Christen, MD  gabapentin (NEURONTIN) 300 MG capsule Take 1 capsule (300 mg total) by mouth 2 (two) times daily. 09/07/18 09/07/19  Donnal Moat T, PA-C  ketoconazole (NIZORAL) 2 % shampoo Apply 1 application topically 2 (two) times a week. Patient not taking: Reported on 09/07/2018 04/29/18   Fatima Blank, MD  lithium carbonate 300 MG capsule Take 2 capsules (600 mg total) by mouth at bedtime. 09/07/18   Addison Lank, PA-C  oxyCODONE-acetaminophen (PERCOCET/ROXICET) 5-325 MG tablet Take 1 tablet by mouth every 6 (six) hours as needed for severe pain. 10/19/18   Alroy Bailiff, Vannessa [place], PA-C  predniSONE (DELTASONE) 10 MG tablet 3 tablets for 3 days, 2 tablets for 3 days, 1 tablet for 3 days 10/06/18   Nat Christen, MD  promethazine (PHENERGAN) 25 MG tablet Take 1 tablet (25 mg total) by mouth every 6 (six) hours as needed for nausea or vomiting. 05/29/18   Gareth Morgan, MD  QUEtiapine (SEROQUEL) 400 MG tablet Take 2 tablets (800 mg total) by mouth at bedtime. For mood control 09/07/18   Donnal Moat T, PA-C  traMADol (ULTRAM) 50 MG tablet TK 1 T PO Q 6 H PRN P 02/16/18   [provider]  traZODone (DESYREL) 100 MG tablet Take 2 tablets (200 mg total) by mouth at bedtime. 09/07/18   Donnal Moat T, PA-C  zolpidem (AMBIEN) 10 MG tablet Take 1 tablet (10 mg total) by mouth at bedtime. For sleep 10/01/18 12/30/18  Addison Lank PA-C    Family History Family History  Problem Relation Age of Onset  . Colon cancer Father 37  . Breast cancer Father   . Breast cancer Mother   . Uterine cancer Mother   .  Stomach cancer Neg Hx   . Rectal cancer Neg Hx   . Esophageal cancer Neg Hx     Social History Social History   Tobacco Use  . Smoking status: Never Smoker  . Smokeless tobacco: Never Used  Substance Use Topics  . Alcohol use: No    Alcohol/week: 0.0 standard drinks  . Drug use: No    Types: Other-see comments, Fentanyl, Hydrocodone     Allergies  Patient has no known allergies.   Review of Systems Review of Systems  Constitutional: Negative for chills and fever.  Genitourinary: Negative for difficulty urinating.  Musculoskeletal: Positive for back pain. Negative for gait problem.     Physical Exam Updated Vital Signs BP 121/81   Pulse 83   Temp 98.7 F (37.1 C) (Oral)   Resp 16   Ht 5\' 6"  (1.676 m)   Wt 90.7 kg   SpO2 93%   BMI 32.28 kg/m   Physical Exam Vitals signs and nursing note reviewed.  Constitutional:      Appearance: She is not ill-appearing.  HENT:     Head: Normocephalic and atraumatic.  Eyes:     Conjunctiva/sclera: Conjunctivae normal.  Cardiovascular:     Rate and Rhythm: Normal rate and regular rhythm.  Pulmonary:     Effort: Pulmonary effort is normal.     Breath sounds: Normal breath sounds.  Musculoskeletal:     Comments: No C, T, or L midline spinal tenderness. Mild right paraspinal tenderness but more so into right hip. Strength 4/5 compared to left due to pain. Sensation intact throughout. Good distal pulses and reflexes intact.   Skin:    General: Skin is warm and dry.     Coloration: Skin is not jaundiced.  Neurological:     Mental Status: She is alert.      ED Treatments / Results  Labs (all labs ordered are listed, but only abnormal results are displayed) Labs Reviewed - No data to display  EKG None  Radiology No results found.  Procedures Procedures (including critical care time)  Medications Ordered in ED Medications - No data to display   Initial Impression / Assessment and Plan / ED Course  I have  reviewed the triage vital signs and the nursing notes.  Pertinent labs & imaging results that were available during my care of the patient were reviewed by me and considered in my medical decision making (see chart for details).    53 year old female presenting to the ED today with unchanged low back pain, requesting MRI sooner than scheduled. She is being followed by Dr. Casimer Leek orthopedist outpatient and is a pain management pt currently being prescribed Tramadol 50 mg. She reports this has not helped her symptoms. Originally prescribed short course of percocet on 07/22 when she was seen in the ED. PMDP reviewed and is consistent with pt's history. She has no red flag symptoms today and is able to ambulate without difficulty. Do not feel she needs an emergent MRI today. Will give very short course of pain medication but ultimately told pt she will need to follow up with pain management and orthopedist regarding consistent pain. Advised patient that she cannot return to ED again for pain medication given [place] STOP ACT. Strict return precautions discussed. Pt is in agreement with plan at this time and stable for discharge home.        Final Clinical Impressions(s) / ED Diagnoses   Final diagnoses:  Acute right-sided low back pain with right-sided sciatica    ED Discharge Orders         Ordered    oxyCODONE-acetaminophen (PERCOCET/ROXICET) 5-325 MG tablet  Every 6 hours PRN     10/19/18 1635           Eustaquio Maize, PA-C 10/19/18 1722    Varney Biles, MD 10/20/18 1717

## 2018-10-19 NOTE — ED Triage Notes (Signed)
Pt states she hurt back on 7/15. Pt states she was working to get an MRI, but it's not until 8/20. Pt states she cannot wait until then. Pt states pain is lower back and down back of right leg and front of left leg.

## 2018-12-07 ENCOUNTER — Ambulatory Visit: Payer: Medicare Other | Admitting: Physician Assistant

## 2018-12-17 MED ORDER — GENERIC EXTERNAL MEDICATION
Status: DC
Start: ? — End: 2018-12-17

## 2018-12-17 MED ORDER — SIMETHICONE 80 MG PO CHEW
80.00 | CHEWABLE_TABLET | ORAL | Status: DC
Start: ? — End: 2018-12-17

## 2018-12-17 MED ORDER — FAMOTIDINE 20 MG PO TABS
20.00 | ORAL_TABLET | ORAL | Status: DC
Start: ? — End: 2018-12-17

## 2018-12-17 MED ORDER — POLYETHYLENE GLYCOL 3350 17 G PO PACK
17.00 | PACK | ORAL | Status: DC
Start: 2018-12-18 — End: 2018-12-17

## 2018-12-17 MED ORDER — ALUM & MAG HYDROXIDE-SIMETH 400-400-40 MG/5ML PO SUSP
30.00 | ORAL | Status: DC
Start: ? — End: 2018-12-17

## 2018-12-17 MED ORDER — TIZANIDINE HCL 4 MG PO TABS
2.00 | ORAL_TABLET | ORAL | Status: DC
Start: ? — End: 2018-12-17

## 2018-12-17 MED ORDER — DIPHENHYDRAMINE HCL 25 MG PO CAPS
25.00 | ORAL_CAPSULE | ORAL | Status: DC
Start: ? — End: 2018-12-17

## 2018-12-17 MED ORDER — LITHIUM CARBONATE 600 MG PO CAPS
600.00 | ORAL_CAPSULE | ORAL | Status: DC
Start: ? — End: 2018-12-17

## 2018-12-17 MED ORDER — LACTATED RINGERS IV SOLN
INTRAVENOUS | Status: DC
Start: ? — End: 2018-12-17

## 2018-12-17 MED ORDER — CARBOXYMETHYLCELLULOSE SOD PF 0.5 % OP SOLN
1.00 | OPHTHALMIC | Status: DC
Start: ? — End: 2018-12-17

## 2018-12-17 MED ORDER — SENNOSIDES-DOCUSATE SODIUM 8.6-50 MG PO TABS
1.00 | ORAL_TABLET | ORAL | Status: DC
Start: 2018-12-17 — End: 2018-12-17

## 2018-12-17 MED ORDER — ALBUTEROL SULFATE HFA 108 (90 BASE) MCG/ACT IN AERS
1.00 | INHALATION_SPRAY | RESPIRATORY_TRACT | Status: DC
Start: ? — End: 2018-12-17

## 2018-12-17 MED ORDER — BISACODYL 10 MG RE SUPP
10.00 | RECTAL | Status: DC
Start: ? — End: 2018-12-17

## 2018-12-17 MED ORDER — QUETIAPINE FUMARATE 200 MG PO TABS
800.00 | ORAL_TABLET | ORAL | Status: DC
Start: 2018-12-17 — End: 2018-12-17

## 2018-12-17 MED ORDER — OXYCODONE HCL 5 MG PO TABS
5.00 | ORAL_TABLET | ORAL | Status: DC
Start: ? — End: 2018-12-17

## 2018-12-23 ENCOUNTER — Telehealth: Payer: Self-pay | Admitting: Physician Assistant

## 2018-12-23 NOTE — Telephone Encounter (Signed)
error 

## 2018-12-24 ENCOUNTER — Other Ambulatory Visit: Payer: Self-pay | Admitting: Physician Assistant

## 2019-01-03 ENCOUNTER — Other Ambulatory Visit: Payer: Self-pay

## 2019-01-03 ENCOUNTER — Encounter: Payer: Self-pay | Admitting: Physician Assistant

## 2019-01-03 ENCOUNTER — Ambulatory Visit (INDEPENDENT_AMBULATORY_CARE_PROVIDER_SITE_OTHER): Payer: Medicare Other | Admitting: Physician Assistant

## 2019-01-03 DIAGNOSIS — F250 Schizoaffective disorder, bipolar type: Secondary | ICD-10-CM

## 2019-01-03 DIAGNOSIS — F411 Generalized anxiety disorder: Secondary | ICD-10-CM

## 2019-01-03 NOTE — Progress Notes (Signed)
[facility]  Patient ID: [name],  MRN: [id number]  PCP: Brunetta Jeans, PA-C  Date of Evaluation: 01/03/2019 Time spent:15 minutes  Chief Complaint:  Chief Complaint    Follow-up     Virtual Visit via Telephone Note  I connected with patient by a video enabled telemedicine application or telephone, with their informed consent, and verified patient privacy and that I am speaking with the correct person using two identifiers.  I am private, in my office and the patient is at home.  I discussed the limitations, risks, security and privacy concerns of performing an evaluation and management service by telephone and the availability of in person appointments. I also discussed with the patient that there may be a patient responsible charge related to this service. The patient expressed understanding and agreed to proceed.   I discussed the assessment and treatment plan with the patient. The patient was provided an opportunity to ask questions and all were answered. The patient agreed with the plan and demonstrated an understanding of the instructions.   The patient was advised to call back or seek an in-person evaluation if the symptoms worsen or if the condition fails to improve as anticipated.  I provided 15 minutes of non-face-to-face time during this encounter.  HISTORY/CURRENT STATUS: HPI for routine med check.  Patient had a lumbar laminectomy a few weeks ago.  She had no known injury but started having severe back pain with radiation several months ago and ended up having the surgery.  States she does not really feel much better yet.   Mentally, states she is feeling well.  Denies anhedonia, energy and motivation are good except for her physical limitations.  Denies easy crying.  No suicidal or homicidal thoughts.  Patient denies increased energy with decreased need for sleep, no increased talkativeness, no racing thoughts, no impulsivity or risky behaviors,  no increased spending, no increased libido, no grandiosity.  No hallucinations.  She and her family were planning to move to [place] sometime this summer or fall, but because of her back surgery and the fact that [place] is still "closed due to [place]" they have not been able to get there.  It may be a while before they can move.  Denies dizziness, syncope, seizures, numbness, tingling, tremor, tics, unsteady gait, slurred speech, confusion. Denies muscle or joint pain, stiffness, or dystonia.  Individual Medical History/ Review of Systems: Changes? :Yes had lumbar laminectomy a few weeks ago.   Past medications for mental health diagnoses include: Depakote, Tegretol, BuSpar  Allergies: Patient has no known allergies.  Current Medications:  Current Outpatient Medications:  .  albuterol (VENTOLIN HFA) 108 (90 Base) MCG/ACT inhaler, INHALE 1 TO 2 PUFFS INTO THE LUNGS EVERY 6 HOURS AS NEEDED FOR WHEEZING OR SHORTNESS OF BREATH, Disp: 18 g, Rfl: 3 .  busPIRone (BUSPAR) 15 MG tablet, Take 1 tablet (15 mg total) by mouth at bedtime., Disp: 90 tablet, Rfl: 0 .  lithium carbonate 300 MG capsule, TAKE 2 CAPSULES(600 MG) BY MOUTH AT BEDTIME, Disp: 180 capsule, Rfl: 0 .  oxyCODONE-acetaminophen (PERCOCET/ROXICET) 5-325 MG tablet, Take 1 tablet by mouth every 6 (six) hours as needed for severe pain., Disp: 8 tablet, Rfl: 0 .  promethazine (PHENERGAN) 25 MG tablet, Take 1 tablet (25 mg total) by mouth every 6 (six) hours as needed for nausea or vomiting., Disp: 30 tablet, Rfl: 0 .  QUEtiapine (SEROQUEL) 400 MG tablet, TAKE 2 TABLETS(800 MG) BY MOUTH AT BEDTIME FOR MOOD, Disp:  180 tablet, Rfl: 0 .  traMADol (ULTRAM) 50 MG tablet, TK 1 T PO Q 6 H PRN P, Disp: , Rfl:  .  traZODone (DESYREL) 100 MG tablet, TAKE 2 TABLETS(200 MG) BY MOUTH AT BEDTIME, Disp: 180 tablet, Rfl: 0 .  zolpidem (AMBIEN) 10 MG tablet, Take 1 tablet (10 mg total) by mouth at bedtime. For sleep, Disp: 90 tablet, Rfl: 0 .   cyclobenzaprine (FLEXERIL) 5 MG tablet, Take 1 tablet (5 mg total) by mouth at bedtime. (Patient not taking: Reported on 09/07/2018), Disp: 15 tablet, Rfl: 0 .  diclofenac (VOLTAREN) 50 MG EC tablet, Take 1 tablet (50 mg total) by mouth 2 (two) times daily. (Patient not taking: Reported on 01/03/2019), Disp: 15 tablet, Rfl: 0 .  gabapentin (NEURONTIN) 300 MG capsule, Take 1 capsule (300 mg total) by mouth 2 (two) times daily. (Patient not taking: Reported on 01/03/2019), Disp: 180 capsule, Rfl: 0 .  ketoconazole (NIZORAL) 2 % shampoo, Apply 1 application topically 2 (two) times a week. (Patient not taking: Reported on 09/07/2018), Disp: 120 mL, Rfl: 0 .  predniSONE (DELTASONE) 10 MG tablet, 3 tablets for 3 days, 2 tablets for 3 days, 1 tablet for 3 days (Patient not taking: Reported on 01/03/2019), Disp: 18 tablet, Rfl: 0 Medication Side Effects: none  Family Medical/ Social History: Changes? No  MENTAL HEALTH EXAM:  There were no vitals taken for this visit.There is no height or weight on file to calculate BMI.  General Appearance: unable to assess  Eye Contact:  unable to assess  Speech:  Clear and Coherent  Volume:  Normal  Mood:  Euthymic  Affect:  unable to assess  Thought Process:  Goal Directed and Descriptions of Associations: Intact  Orientation:  Full (Time, Place, and Person)  Thought Content: Logical   Suicidal Thoughts:  No  Homicidal Thoughts:  No  Memory:  WNL  Judgement:  Good  Insight:  Good  Psychomotor Activity:  Unable to assess  Concentration:  Concentration: Good  Recall:  Good  Fund of Knowledge: Good  Language: Good  Assets:  Desire for Improvement  ADL's:  Intact  Cognition: WNL  Prognosis:  Good    DIAGNOSES:    ICD-10-CM   1. Schizoaffective disorder, bipolar type (HCC)  F25.0   2. Generalized anxiety disorder  F41.1     Receiving Psychotherapy: No    RECOMMENDATIONS:  PDMP was reviewed. Continue BuSpar 15 mg 1 p.o. nightly. Continue lithium  300 mg, 2 nightly. Continue Seroquel 400 mg, 2 nightly. Continue trazodone 100 mg 2 nightly as needed. Continue Ambien 10 mg nightly as needed. Get labs done that were ordered at last visit. Return in 3 months.  Donnal Moat, PA-C

## 2019-01-27 ENCOUNTER — Other Ambulatory Visit: Payer: Self-pay | Admitting: Physician Assistant

## 2019-03-23 ENCOUNTER — Other Ambulatory Visit: Payer: Self-pay | Admitting: Physician Assistant

## 2019-03-25 ENCOUNTER — Other Ambulatory Visit: Payer: Self-pay | Admitting: Physician Assistant

## 2019-03-27 NOTE — Telephone Encounter (Signed)
Last apt 10/19, due back 3 months Last refill 12/17

## 2019-05-13 ENCOUNTER — Emergency Department (HOSPITAL_COMMUNITY): Payer: Medicare Other

## 2019-05-13 ENCOUNTER — Other Ambulatory Visit: Payer: Self-pay

## 2019-05-13 ENCOUNTER — Encounter (HOSPITAL_COMMUNITY): Payer: Self-pay | Admitting: *Deleted

## 2019-05-13 ENCOUNTER — Inpatient Hospital Stay (HOSPITAL_COMMUNITY)
Admission: EM | Admit: 2019-05-13 | Discharge: 2019-05-18 | DRG: 177 | Disposition: A | Discharge status: Home / Self Care | Payer: Medicare Other | Attending: Internal Medicine | Admitting: Internal Medicine

## 2019-05-13 DIAGNOSIS — K222 Esophageal obstruction: Secondary | ICD-10-CM | POA: Diagnosis present

## 2019-05-13 DIAGNOSIS — Z20822 Contact with and (suspected) exposure to covid-19: Secondary | ICD-10-CM | POA: Diagnosis present

## 2019-05-13 DIAGNOSIS — R0602 Shortness of breath: Secondary | ICD-10-CM | POA: Diagnosis not present

## 2019-05-13 DIAGNOSIS — K229 Disease of esophagus, unspecified: Secondary | ICD-10-CM

## 2019-05-13 DIAGNOSIS — G4733 Obstructive sleep apnea (adult) (pediatric): Secondary | ICD-10-CM | POA: Diagnosis present

## 2019-05-13 DIAGNOSIS — Z8049 Family history of malignant neoplasm of other genital organs: Secondary | ICD-10-CM | POA: Diagnosis not present

## 2019-05-13 DIAGNOSIS — K2289 Other specified disease of esophagus: Secondary | ICD-10-CM

## 2019-05-13 DIAGNOSIS — K2970 Gastritis, unspecified, without bleeding: Secondary | ICD-10-CM | POA: Diagnosis present

## 2019-05-13 DIAGNOSIS — K228 Other specified diseases of esophagus: Secondary | ICD-10-CM | POA: Diagnosis not present

## 2019-05-13 DIAGNOSIS — F250 Schizoaffective disorder, bipolar type: Secondary | ICD-10-CM | POA: Diagnosis present

## 2019-05-13 DIAGNOSIS — G9341 Metabolic encephalopathy: Secondary | ICD-10-CM | POA: Diagnosis present

## 2019-05-13 DIAGNOSIS — F314 Bipolar disorder, current episode depressed, severe, without psychotic features: Secondary | ICD-10-CM | POA: Diagnosis present

## 2019-05-13 DIAGNOSIS — K219 Gastro-esophageal reflux disease without esophagitis: Secondary | ICD-10-CM | POA: Diagnosis present

## 2019-05-13 DIAGNOSIS — Z800 Family history of malignant neoplasm of digestive organs: Secondary | ICD-10-CM

## 2019-05-13 DIAGNOSIS — G894 Chronic pain syndrome: Secondary | ICD-10-CM | POA: Diagnosis present

## 2019-05-13 DIAGNOSIS — J9601 Acute respiratory failure with hypoxia: Secondary | ICD-10-CM | POA: Diagnosis present

## 2019-05-13 DIAGNOSIS — J690 Pneumonitis due to inhalation of food and vomit: Secondary | ICD-10-CM | POA: Diagnosis present

## 2019-05-13 DIAGNOSIS — Z803 Family history of malignant neoplasm of breast: Secondary | ICD-10-CM | POA: Diagnosis not present

## 2019-05-13 DIAGNOSIS — J189 Pneumonia, unspecified organism: Secondary | ICD-10-CM | POA: Diagnosis present

## 2019-05-13 DIAGNOSIS — R933 Abnormal findings on diagnostic imaging of other parts of digestive tract: Secondary | ICD-10-CM | POA: Diagnosis not present

## 2019-05-13 DIAGNOSIS — Z23 Encounter for immunization: Secondary | ICD-10-CM | POA: Diagnosis present

## 2019-05-13 LAB — COMPREHENSIVE METABOLIC PANEL
ALT: 84 U/L — ABNORMAL HIGH (ref 0–44)
AST: 57 U/L — ABNORMAL HIGH (ref 15–41)
Albumin: 3.7 g/dL (ref 3.5–5.0)
Alkaline Phosphatase: 64 U/L (ref 38–126)
Anion gap: 9 (ref 5–15)
BUN: 13 mg/dL (ref 6–20)
CO2: 23 mmol/L (ref 22–32)
Calcium: 9.1 mg/dL (ref 8.9–10.3)
Chloride: 107 mmol/L (ref 98–111)
Creatinine, Ser: 1.08 mg/dL — ABNORMAL HIGH (ref 0.44–1.00)
GFR calc Af Amer: >60 mL/min (ref >60)
GFR calc non Af Amer: 59 mL/min — ABNORMAL LOW (ref >60)
Glucose, Bld: 123 mg/dL — ABNORMAL HIGH (ref 70–99)
Potassium: 4.1 mmol/L (ref 3.5–5.1)
Sodium: 139 mmol/L (ref 135–145)
Total Bilirubin: 0.3 mg/dL (ref 0.3–1.2)
Total Protein: 6.4 g/dL — ABNORMAL LOW (ref 6.5–8.1)

## 2019-05-13 LAB — BLOOD GAS, ARTERIAL
Acid-base deficit: 3.4 mmol/L — ABNORMAL HIGH (ref 0.0–2.0)
Bicarbonate: 23.6 mmol/L (ref 20.0–28.0)
FIO2: 28
O2 Saturation: 91.2 %
Patient temperature: 98.6
pCO2 arterial: 53.8 mmHg — ABNORMAL HIGH (ref 32.0–48.0)
pH, Arterial: 7.265 — ABNORMAL LOW (ref 7.350–7.450)
pO2, Arterial: 64.3 mmHg — ABNORMAL LOW (ref 83.0–108.0)

## 2019-05-13 LAB — CBC WITH DIFFERENTIAL/PLATELET
Abs Immature Granulocytes: 0.05 10*3/uL (ref 0.00–0.07)
Basophils Absolute: 0 10*3/uL (ref 0.0–0.1)
Basophils Relative: 0 %
Eosinophils Absolute: 0 10*3/uL (ref 0.0–0.5)
Eosinophils Relative: 0 %
HCT: 38.3 % (ref 36.0–46.0)
Hemoglobin: 12 g/dL (ref 12.0–15.0)
Immature Granulocytes: 0 %
Lymphocytes Relative: 5 %
Lymphs Abs: 0.6 10*3/uL — ABNORMAL LOW (ref 0.7–4.0)
MCH: 29.3 pg (ref 26.0–34.0)
MCHC: 31.3 g/dL (ref 30.0–36.0)
MCV: 93.6 fL (ref 80.0–100.0)
Monocytes Absolute: 0.6 10*3/uL (ref 0.1–1.0)
Monocytes Relative: 5 %
Neutro Abs: 10.4 10*3/uL — ABNORMAL HIGH (ref 1.7–7.7)
Neutrophils Relative %: 90 %
Platelets: 130 10*3/uL — ABNORMAL LOW (ref 150–400)
RBC: 4.09 MIL/uL (ref 3.87–5.11)
RDW: 13 % (ref 11.5–15.5)
WBC: 11.7 10*3/uL — ABNORMAL HIGH (ref 4.0–10.5)
nRBC: 0 % (ref 0.0–0.2)

## 2019-05-13 LAB — LIPASE, BLOOD: Lipase: 37 U/L (ref 11–51)

## 2019-05-13 LAB — GLUCOSE, CAPILLARY
Glucose-Capillary: 120 mg/dL — ABNORMAL HIGH (ref 70–99)
Glucose-Capillary: 127 mg/dL — ABNORMAL HIGH (ref 70–99)

## 2019-05-13 LAB — D-DIMER, QUANTITATIVE: D-Dimer, Quant: 0.8 ug/mL-FEU — ABNORMAL HIGH (ref 0.00–0.50)

## 2019-05-13 LAB — TROPONIN I (HIGH SENSITIVITY)
Troponin I (High Sensitivity): <2 ng/L (ref <18)
Troponin I (High Sensitivity): <2 ng/L (ref <18)

## 2019-05-13 LAB — POC SARS CORONAVIRUS 2 AG -  ED: SARS Coronavirus 2 Ag: NEGATIVE

## 2019-05-13 LAB — SARS CORONAVIRUS 2 (TAT 6-24 HRS): SARS Coronavirus 2: NEGATIVE

## 2019-05-13 MED ORDER — LORAZEPAM 2 MG/ML IJ SOLN
0.5000 mg | Freq: Once | INTRAMUSCULAR | Status: AC
  Administered 2019-05-13: 0.5 mg via INTRAVENOUS
  Filled 2019-05-13: qty 1

## 2019-05-13 MED ORDER — ACETAMINOPHEN 650 MG RE SUPP: 650.0000 mg | Freq: Four times a day (QID) | RECTAL | Status: DC | PRN

## 2019-05-13 MED ORDER — INFLUENZA VAC SPLIT QUAD 0.5 ML IM SUSY
0.5000 mL | PREFILLED_SYRINGE | INTRAMUSCULAR | Status: AC
  Administered 2019-05-14: 0.5 mL via INTRAMUSCULAR
  Filled 2019-05-13: qty 0.5

## 2019-05-13 MED ORDER — ONDANSETRON HCL 4 MG PO TABS: 4.0000 mg | ORAL_TABLET | Freq: Four times a day (QID) | ORAL | Status: DC | PRN

## 2019-05-13 MED ORDER — TRAZODONE HCL 100 MG PO TABS
200.0000 mg | ORAL_TABLET | Freq: Every day | ORAL | Status: DC
  Administered 2019-05-14 – 2019-05-17 (×5): 200 mg via ORAL
  Filled 2019-05-13 (×5): qty 2

## 2019-05-13 MED ORDER — ONDANSETRON HCL 4 MG/2ML IJ SOLN
INTRAMUSCULAR | Status: AC
  Administered 2019-05-13: 4 mg via INTRAVENOUS
  Filled 2019-05-13: qty 2

## 2019-05-13 MED ORDER — PREDNISONE 20 MG PO TABS
40.0000 mg | ORAL_TABLET | Freq: Every day | ORAL | Status: DC
  Administered 2019-05-14 – 2019-05-15 (×2): 40 mg via ORAL
  Filled 2019-05-13 (×2): qty 2

## 2019-05-13 MED ORDER — ACETAMINOPHEN 325 MG PO TABS
650.0000 mg | ORAL_TABLET | Freq: Four times a day (QID) | ORAL | Status: DC | PRN
  Administered 2019-05-14 (×3): 650 mg via ORAL
  Filled 2019-05-13 (×4): qty 2

## 2019-05-13 MED ORDER — ENOXAPARIN SODIUM 40 MG/0.4ML ~~LOC~~ SOLN
40.0000 mg | Freq: Every day | SUBCUTANEOUS | Status: DC
  Administered 2019-05-14 – 2019-05-15 (×3): 40 mg via SUBCUTANEOUS
  Filled 2019-05-13 (×3): qty 0.4

## 2019-05-13 MED ORDER — LABETALOL HCL 5 MG/ML IV SOLN
10.0000 mg | INTRAVENOUS | Status: DC | PRN
  Administered 2019-05-13: 10 mg via INTRAVENOUS
  Filled 2019-05-13: qty 4

## 2019-05-13 MED ORDER — IPRATROPIUM BROMIDE HFA 17 MCG/ACT IN AERS
2.0000 | INHALATION_SPRAY | RESPIRATORY_TRACT | Status: DC
  Administered 2019-05-14 (×3): 2 via RESPIRATORY_TRACT
  Filled 2019-05-13: qty 12.9

## 2019-05-13 MED ORDER — SODIUM CHLORIDE 0.9 % IV SOLN
3.0000 g | Freq: Four times a day (QID) | INTRAVENOUS | Status: DC
  Administered 2019-05-14 – 2019-05-18 (×19): 3 g via INTRAVENOUS
  Filled 2019-05-13 (×7): qty 3
  Filled 2019-05-13: qty 8
  Filled 2019-05-13 (×2): qty 3
  Filled 2019-05-13: qty 8
  Filled 2019-05-13 (×6): qty 3
  Filled 2019-05-13 (×3): qty 8

## 2019-05-13 MED ORDER — ALBUTEROL SULFATE HFA 108 (90 BASE) MCG/ACT IN AERS: 2.0000 | INHALATION_SPRAY | RESPIRATORY_TRACT | Status: DC

## 2019-05-13 MED ORDER — SODIUM CHLORIDE 0.9 % IV SOLN
1.0000 g | Freq: Once | INTRAVENOUS | Status: AC
  Administered 2019-05-13: 1 g via INTRAVENOUS
  Filled 2019-05-13: qty 10

## 2019-05-13 MED ORDER — SODIUM CHLORIDE (PF) 0.9 % IJ SOLN
INTRAMUSCULAR | Status: AC
  Filled 2019-05-13: qty 50

## 2019-05-13 MED ORDER — SODIUM CHLORIDE 0.9 % IV BOLUS
1000.0000 mL | Freq: Once | INTRAVENOUS | Status: AC
  Administered 2019-05-13: 1000 mL via INTRAVENOUS

## 2019-05-13 MED ORDER — BUSPIRONE HCL 10 MG PO TABS: 15.0000 mg | ORAL_TABLET | Freq: Every day | ORAL | Status: DC

## 2019-05-13 MED ORDER — MORPHINE SULFATE (PF) 4 MG/ML IV SOLN
4.0000 mg | Freq: Once | INTRAVENOUS | Status: AC
  Administered 2019-05-13: 13:00:00 4 mg via INTRAVENOUS
  Filled 2019-05-13: qty 1

## 2019-05-13 MED ORDER — IOHEXOL 350 MG/ML SOLN
100.0000 mL | Freq: Once | INTRAVENOUS | Status: AC | PRN
  Administered 2019-05-13: 100 mL via INTRAVENOUS

## 2019-05-13 MED ORDER — LITHIUM CARBONATE 300 MG PO CAPS
600.0000 mg | ORAL_CAPSULE | Freq: Every day | ORAL | Status: DC
  Administered 2019-05-14 – 2019-05-17 (×5): 600 mg via ORAL
  Filled 2019-05-13 (×6): qty 2

## 2019-05-13 MED ORDER — SODIUM CHLORIDE 0.9 % IV SOLN: INTRAVENOUS | Status: AC

## 2019-05-13 MED ORDER — QUETIAPINE FUMARATE 200 MG PO TABS
400.0000 mg | ORAL_TABLET | Freq: Every day | ORAL | Status: DC
  Administered 2019-05-14 – 2019-05-17 (×5): 400 mg via ORAL
  Filled 2019-05-13 (×4): qty 1
  Filled 2019-05-13: qty 2

## 2019-05-13 MED ORDER — OXYCODONE HCL 5 MG PO TABS
5.0000 mg | ORAL_TABLET | ORAL | Status: DC | PRN
  Administered 2019-05-14 (×2): 5 mg via ORAL
  Filled 2019-05-13 (×2): qty 1

## 2019-05-13 MED ORDER — ONDANSETRON HCL 4 MG/2ML IJ SOLN
4.0000 mg | Freq: Once | INTRAMUSCULAR | Status: AC
  Administered 2019-05-13: 4 mg via INTRAVENOUS
  Filled 2019-05-13: qty 2

## 2019-05-13 MED ORDER — ZOLPIDEM TARTRATE 5 MG PO TABS
5.0000 mg | ORAL_TABLET | Freq: Every evening | ORAL | Status: DC | PRN
  Administered 2019-05-14 – 2019-05-17 (×4): 5 mg via ORAL
  Filled 2019-05-13 (×4): qty 1

## 2019-05-13 MED ORDER — SODIUM CHLORIDE 0.9 % IV BOLUS
1000.0000 mL | Freq: Once | INTRAVENOUS | Status: AC
  Administered 2019-05-13: 13:00:00 1000 mL via INTRAVENOUS

## 2019-05-13 MED ORDER — ONDANSETRON HCL 4 MG/2ML IJ SOLN
4.0000 mg | Freq: Four times a day (QID) | INTRAMUSCULAR | Status: DC | PRN
  Administered 2019-05-14: 4 mg via INTRAVENOUS
  Filled 2019-05-13: qty 2

## 2019-05-13 MED ORDER — SODIUM CHLORIDE 0.9 % IV SOLN
500.0000 mg | Freq: Once | INTRAVENOUS | Status: AC
  Administered 2019-05-13: 500 mg via INTRAVENOUS
  Filled 2019-05-13: qty 500

## 2019-05-13 NOTE — ED Notes (Signed)
Upon going into pts room, pt was noted to have vomited again. Pts gown and linen changed. Pt cleaned.

## 2019-05-13 NOTE — ED Notes (Signed)
Upon going into pt room. Pt had vomited all over herself. Pt cleaned. New gown and linen changed. PA aware.

## 2019-05-13 NOTE — ED Notes (Signed)
Upon going into pt room, pt noted to have o2 sat of 84% on 2L with a good waveform. Pts o2 increased to 4L with no change in o2 sat, Pt repositioned in bed and sat more upright, Pts o2 sat up to 94%. Pts oxygen decreased to 2L and pt maintaining O2 sat at 94% on 2L. PA aware

## 2019-05-13 NOTE — ED Notes (Signed)
Purewick placed in Pt

## 2019-05-13 NOTE — ED Notes (Signed)
Pt transported to Xray. 

## 2019-05-13 NOTE — Progress Notes (Signed)
PHARMACY NOTE:   Pharmacy asked to assist with dosing of Unasyn for aspiration pneumonia. Dosage of 3g IV q6h ordered per MD is appropriate and need for further dosage adjustment appears unlikely at present.    Will sign off at this time.  Please reconsult if a change in clinical status warrants re-evaluation of dosage.   Lindell Spar, PharmD, BCPS Clinical Pharmacist  05/13/2019 6:08 PM

## 2019-05-13 NOTE — ED Notes (Signed)
[name], husband would like an update on his wife, [phone].

## 2019-05-13 NOTE — ED Provider Notes (Signed)
[facility] DEPT Provider Note   CSN: [id number] Arrival date & time: 05/13/19  1117     History Chief Complaint  Patient presents with  . Emesis  . Cough  . Shortness of Breath    [name] is a 54 y.o. female with history of anxiety, bipolar affective disorder, migraine headaches, OSA, GERD, chronic pain syndrome presents for evaluation of acute onset, persistent and progressively worsening shortness of breath, cough, nausea and vomiting.  Reports that her symptoms began if she awoke at 10 AM today, went to bed last night in her usual state of health.  She states that she has had upwards of 5 or 6 episodes of nonbloody nonbilious emesis.  She has a nonproductive hacking cough.  She endorses substernal chest pains that she describes as squeezing, does not radiate.  Worsens with ambulation.  She denies fevers, no known sick contacts or Covid exposures.  She is a non-smoker.  Denies recent travel or surgeries, no hemoptysis, no prior history of DVT or PE.  She is not on hormone replacement.  She notes bilateral lower extremity edema but states this is chronic and unchanged for her.  Denies abdominal pain, urinary symptoms, diarrhea or constipation.  She states that she has had similar symptoms once in 2012 before but is unsure what the work-up at that time revealed.  The history is provided by the patient.       Past Medical History:  Diagnosis Date  . Anxiety   . Bipolar affective disorder, mixed, in partial or remission   . Depression   . Dysrhythmia    Left sided heart diease  . Headache(784.0)   . History of gallstones   . Kidney stones   . Mental disorder   . Migraine   . Pneumonia    Hx: of as a child  . PONV (postoperative nausea and vomiting)   . Renal disorder   . Sleep apnea    sleep test 2014, did not have osa  . Substance abuse (HCC)    Fentanyl, Percocet. last use 04/2011    Patient Active Problem List   Diagnosis Date Noted    . Aspiration pneumonia ([place]) 05/13/2019  . PNA (pneumonia) 05/13/2019  . Achilles tendon contracture, right 04/18/2016  . OAB (overactive bladder) 02/28/2016  . Urgency incontinence 02/28/2016  . Situational anxiety 06/24/2015  . Mass of arm 04/09/2015  . Deviated septum 11/17/2014  . Gastroesophageal reflux disease without esophagitis 09/02/2014  . Chronic pain syndrome 05/24/2014  . Bereavement 03/13/2014  . Bipolar affective disorder, depressed, severe ([place]) 03/11/2014  . Opiate dependence ([place]) 03/11/2014  . Intentional opiate overdose ([place]) 03/11/2014  . De Quervain's tenosynovitis, left 12/12/2013  . Nausea with vomiting 07/28/2013  . Thrombocytopenia, unspecified ([place]) 07/28/2013  . External hemorrhoids 02/16/2013  . Metatarsalgia, right foot 05/18/2011  . Schizoaffective disorder, bipolar type ([place]) 03/31/2011    Past Surgical History:  Procedure Laterality Date  . ABDOMINAL HYSTERECTOMY    . AMPUTATION Right 09/15/2012   Procedure: AMPUTATION DIGIT- right ;  Surgeon: Newt Minion, MD;  Location: [facility];  Service: Orthopedics;  Laterality: Right;  Right Great Toe Amputation at MTP (metatarsophalangeal)  . BUNIONECTOMY     11/14/2011  . CHOLECYSTECTOMY    . COLONOSCOPY     2001, 2008.  Father has colon cancer  . FOOT SURGERY     right x 2  . NASAL SEPTOPLASTY W/ TURBINOPLASTY Bilateral 12/04/2014   Procedure: NASAL SEPTOPLASTY WITH BILATERAL  TURBINATE REDUCTION;  Surgeon: Izora Gala, MD;  Location: [facility];  Service: ENT;  Laterality: Bilateral;  . TOTAL ABDOMINAL HYSTERECTOMY W/ BILATERAL SALPINGOOPHORECTOMY    . VAGINAL HYSTERECTOMY       OB History    Gravida      Para      Term      Preterm      AB      Living  6     SAB      TAB      Ectopic      Multiple      Live Births              Family History  Problem Relation Age of Onset  . Colon cancer Father 11  . Breast cancer Father   . Breast cancer Mother   . Uterine  cancer Mother   . Stomach cancer Neg Hx   . Rectal cancer Neg Hx   . Esophageal cancer Neg Hx     Social History   Tobacco Use  . Smoking status: Never Smoker  . Smokeless tobacco: Never Used  Substance Use Topics  . Alcohol use: No    Alcohol/week: 0.0 standard drinks  . Drug use: No    Types: Other-see comments, Fentanyl, Hydrocodone    Home Medications Prior to Admission medications   Medication Sig Start Date End Date Taking? Authorizing Provider  albuterol (VENTOLIN HFA) 108 (90 Base) MCG/ACT inhaler INHALE 1 TO 2 PUFFS INTO THE LUNGS EVERY 6 HOURS AS NEEDED FOR WHEEZING OR SHORTNESS OF BREATH 08/10/18   Brunetta Jeans, PA-C  busPIRone (BUSPAR) 15 MG tablet Take 1 tablet (15 mg total) by mouth at bedtime. 09/07/18   Donnal Moat T, PA-C  cyclobenzaprine (FLEXERIL) 5 MG tablet Take 1 tablet (5 mg total) by mouth at bedtime. Patient not taking: Reported on 09/07/2018 08/01/18   Augusto Gamble B, NP  diclofenac (VOLTAREN) 50 MG EC tablet Take 1 tablet (50 mg total) by mouth 2 (two) times daily. Patient not taking: Reported on 01/03/2019 10/06/18   Nat Christen, MD  gabapentin (NEURONTIN) 300 MG capsule Take 1 capsule (300 mg total) by mouth 2 (two) times daily. Patient not taking: Reported on 01/03/2019 09/07/18 09/07/19  Donnal Moat T, PA-C  ketoconazole (NIZORAL) 2 % shampoo Apply 1 application topically 2 (two) times a week. Patient not taking: Reported on 09/07/2018 04/29/18   Fatima Blank, MD  lithium carbonate 300 MG capsule TAKE 2 CAPSULES(600 MG) BY MOUTH AT BEDTIME 03/24/19   Hurst, Dorothea Glassman, PA-C  oxyCODONE-acetaminophen (PERCOCET/ROXICET) 5-325 MG tablet Take 1 tablet by mouth every 6 (six) hours as needed for severe pain. 10/19/18   Alroy Bailiff, Margaux, PA-C  predniSONE (DELTASONE) 10 MG tablet 3 tablets for 3 days, 2 tablets for 3 days, 1 tablet for 3 days Patient not taking: Reported on 01/03/2019 10/06/18   Nat Christen, MD  promethazine (PHENERGAN) 25 MG tablet Take 1  tablet (25 mg total) by mouth every 6 (six) hours as needed for nausea or vomiting. 05/29/18   Gareth Morgan, MD  QUEtiapine (SEROQUEL) 400 MG tablet TAKE 2 TABLETS(800 MG) BY MOUTH AT BEDTIME FOR MOOD 03/24/19   Donnal Moat T, PA-C  traMADol (ULTRAM) 50 MG tablet TK 1 T PO Q 6 H PRN P 02/16/18   [provider]  traZODone (DESYREL) 100 MG tablet TAKE 2 TABLETS(200 MG) BY MOUTH AT BEDTIME 03/24/19   Hurst, Teresa T, PA-C  zolpidem Lorrin Mais)  10 MG tablet TAKE 1 TABLET(10 MG) BY MOUTH AT BEDTIME FOR SLEEP 03/28/19   Addison Lank, PA-C    Allergies    Patient has no known allergies.  Review of Systems   Review of Systems  Constitutional: Negative for chills and fever.  Respiratory: Positive for cough and shortness of breath.   Cardiovascular: Positive for chest pain. Negative for leg swelling (chronic, unchanged).  Gastrointestinal: Positive for nausea and vomiting. Negative for abdominal pain, constipation and diarrhea.  Genitourinary: Negative for dysuria, frequency, hematuria and urgency.  All other systems reviewed and are negative.   Physical Exam Updated Vital Signs BP (!) 155/92   Pulse (!) 114   Temp 98.3 F (36.8 C) (Oral)   Resp 17   Ht 5\' 7"  (1.702 m)   Wt 95.3 kg   SpO2 95%   BMI 32.89 kg/m   Physical Exam Vitals and nursing note reviewed.  Constitutional:      General: She is not in acute distress.    Appearance: She is well-developed.  HENT:     Head: Normocephalic and atraumatic.  Eyes:     General:        Right eye: No discharge.        Left eye: No discharge.     Conjunctiva/sclera: Conjunctivae normal.  Neck:     Vascular: No JVD.     Trachea: No tracheal deviation.  Cardiovascular:     Rate and Rhythm: Regular rhythm. Tachycardia present.     Comments: 2+ pitting edema of the bilateral lower extremities, Homans' sign absent bilaterally.  Compartments are soft. Pulmonary:     Effort: Tachypnea present.     Comments: SPO2 saturations down to  91% on room air, speaking in short phrases.  Scattered rhonchi and wheezes.  Frequent hacking cough Chest:     Chest wall: No tenderness.  Abdominal:     General: There is no distension.     Palpations: Abdomen is soft. There is no mass.     Tenderness: There is no abdominal tenderness. There is no guarding.  Musculoskeletal:     Cervical back: Neck supple.     Right lower leg: No tenderness. Edema present.     Left lower leg: No tenderness. Edema present.  Skin:    General: Skin is warm and dry.     Findings: No erythema.  Neurological:     Mental Status: She is alert.  Psychiatric:        Behavior: Behavior normal.     ED Results / Procedures / Treatments   Labs (all labs ordered are listed, but only abnormal results are displayed) Labs Reviewed  CBC WITH DIFFERENTIAL/PLATELET - Abnormal; Notable for the following components:      Result Value   WBC 11.7 (*)    Platelets 130 (*)    Neutro Abs 10.4 (*)    Lymphs Abs 0.6 (*)    All other components within normal limits  COMPREHENSIVE METABOLIC PANEL - Abnormal; Notable for the following components:   Glucose, Bld 123 (*)    Creatinine, Ser 1.08 (*)    Total Protein 6.4 (*)    AST 57 (*)    ALT 84 (*)    GFR calc non Af Amer 59 (*)    All other components within normal limits  D-DIMER, QUANTITATIVE (NOT AT [facility]) - Abnormal; Notable for the following components:   D-Dimer, Quant 0.80 (*)    All other components within normal limits  BLOOD  GAS, ARTERIAL - Abnormal; Notable for the following components:   pH, Arterial 7.265 (*)    pCO2 arterial 53.8 (*)    pO2, Arterial 64.3 (*)    Acid-base deficit 3.4 (*)    All other components within normal limits  SARS CORONAVIRUS 2 (TAT 6-24 HRS)  LIPASE, BLOOD  URINALYSIS, ROUTINE W REFLEX MICROSCOPIC  HIV ANTIBODY (ROUTINE TESTING W REFLEX)  CBC  BASIC METABOLIC PANEL  POC SARS CORONAVIRUS 2 AG -  ED  TROPONIN I (HIGH SENSITIVITY)  TROPONIN I (HIGH SENSITIVITY)     EKG None  Radiology DG Chest 2 View  Result Date: 05/13/2019 CLINICAL DATA:  Shortness of breath. EXAM: CHEST - 2 VIEW COMPARISON:  05/29/2018. FINDINGS: Mediastinum and hilar structures normal. Heart size stable given technique. Low lung volumes. Mild right base infiltrate cannot be excluded. No pleural effusion or pneumothorax. IMPRESSION: Low lung volumes.  Mild right base infiltrate cannot be excluded. Electronically Signed   By: Marcello Moores  Register   On: 05/13/2019 11:56   CT Angio Chest PE W and/or Wo Contrast  Result Date: 05/13/2019 CLINICAL DATA:  Shortness of breath with decreased oxygen saturation and positive D-dimer EXAM: CT ANGIOGRAPHY CHEST WITH CONTRAST TECHNIQUE: Multidetector CT imaging of the chest was performed using the standard protocol during bolus administration of intravenous contrast. Multiplanar CT image reconstructions and MIPs were obtained to evaluate the vascular anatomy. CONTRAST:  128mL OMNIPAQUE IOHEXOL 350 MG/ML SOLN COMPARISON:  Chest radiograph May 13, 2019 and CT angiogram chest Jul 24, 2010 FINDINGS: Cardiovascular: There is no demonstrable pulmonary embolus. There is no thoracic aortic aneurysm or dissection. The visualized great vessels appear normal. There are occasional foci of coronary artery calcification. There is no pericardial effusion or pericardial thickening. Mediastinum/Nodes: The right lobe of the thyroid is larger than the left lobe, a stable presumed anatomic variant. No focal thyroid lesions are evident. There is a lymph node to the left of the carina measuring 1.8 x 1.3 cm. No other lymph node prominence evident. There is soft tissue thickening in the upper thoracic esophagus with mild anterior impression on the trachea at this level. Lungs/Pleura: There is focal airspace opacity in portions of the posterior and superior segments of the left lower lobe consistent with pneumonia. There is scarring in the extreme right apex. There are scattered  areas of apparent mosaic attenuation, particularly in the right lower lobe and posterior segment of right upper lobe. No pleural effusions. Upper Abdomen: Gallbladder absent. Visualized upper abdominal structures otherwise appear unremarkable. Musculoskeletal: There are foci of degenerative change in the thoracic spine. No blastic or lytic bone lesions. No chest wall lesions. Review of the MIP images confirms the above findings. IMPRESSION: 1. No demonstrable pulmonary embolus. No thoracic aortic aneurysm or dissection. There are occasional foci of coronary artery calcification. 2. Airspace opacity in portions of the left lower lobe consistent with pneumonia. Areas of apparent mosaic attenuation likely represent foci of small airways obstructive disease. There is also mild patchy atelectasis. 3. Enlarged left pericarinal lymph node of uncertain etiology. No other lymph node prominence. 4. Apparent soft tissue fullness in the upper thoracic esophagus approximately 5 cm. Etiology for this soft tissue fullness uncertain. This finding may warrant direct visualization or contrast esophagram to further evaluate. 5.  Gallbladder absent. Electronically Signed   By: Lowella Grip III M.D.   On: 05/13/2019 15:09    Procedures .Critical Care Performed by: Renita Papa, PA-C Authorized by: Renita Papa, PA-C   Critical care  provider statement:    Critical care time (minutes):  45   Critical care was necessary to treat or prevent imminent or life-threatening deterioration of the following conditions:  Respiratory failure   Critical care was time spent personally by me on the following activities:  Discussions with consultants, evaluation of patient's response to treatment, examination of patient, ordering and performing treatments and interventions, ordering and review of laboratory studies, ordering and review of radiographic studies, pulse oximetry, re-evaluation of patient's condition, obtaining history from  patient or surrogate and review of old charts   (including critical care time)  Medications Ordered in ED Medications  sodium chloride (PF) 0.9 % injection (has no administration in time range)  busPIRone (BUSPAR) tablet 15 mg (has no administration in time range)  lithium carbonate capsule 600 mg (has no administration in time range)  QUEtiapine (SEROQUEL) tablet 400 mg (has no administration in time range)  traZODone (DESYREL) tablet 200 mg (has no administration in time range)  zolpidem (AMBIEN) tablet 5 mg (has no administration in time range)  enoxaparin (LOVENOX) injection 40 mg (has no administration in time range)  acetaminophen (TYLENOL) tablet 650 mg (has no administration in time range)    Or  acetaminophen (TYLENOL) suppository 650 mg (has no administration in time range)  oxyCODONE (Oxy IR/ROXICODONE) immediate release tablet 5 mg (has no administration in time range)  ondansetron (ZOFRAN) tablet 4 mg ( Oral See Alternative 05/13/19 1815)    Or  ondansetron (ZOFRAN) injection 4 mg (4 mg Intravenous Given 05/13/19 1815)  Ampicillin-Sulbactam (UNASYN) 3 g in sodium chloride 0.9 % 100 mL IVPB (has no administration in time range)  predniSONE (DELTASONE) tablet 40 mg (has no administration in time range)  0.9 %  sodium chloride infusion (has no administration in time range)  ipratropium (ATROVENT HFA) inhaler 2 puff (2 puffs Inhalation Not Given 05/13/19 1719)  labetalol (NORMODYNE) injection 10 mg (has no administration in time range)  ondansetron (ZOFRAN) injection 4 mg (4 mg Intravenous Given 05/13/19 1246)  sodium chloride 0.9 % bolus 1,000 mL (1,000 mLs Intravenous Bolus 05/13/19 1247)  morphine 4 MG/ML injection 4 mg (4 mg Intravenous Given 05/13/19 1246)  iohexol (OMNIPAQUE) 350 MG/ML injection 100 mL (100 mLs Intravenous Contrast Given 05/13/19 1434)  LORazepam (ATIVAN) injection 0.5 mg (0.5 mg Intravenous Given 05/13/19 1551)  sodium chloride 0.9 % bolus 1,000 mL (1,000 mLs  Intravenous Bolus 05/13/19 1617)  cefTRIAXone (ROCEPHIN) 1 g in sodium chloride 0.9 % 100 mL IVPB (0 g Intravenous Stopped 05/13/19 1647)  azithromycin (ZITHROMAX) 500 mg in sodium chloride 0.9 % 250 mL IVPB (0 mg Intravenous Stopped 05/13/19 1814)    ED Course  I have reviewed the triage vital signs and the nursing notes.  Pertinent labs & imaging results that were available during my care of the patient were reviewed by me and considered in my medical decision making (see chart for details).    MDM Rules/Calculators/A&P                      ARIENNE DEUR was evaluated in Emergency Department on 05/13/2019 for the symptoms described in the history of present illness. She was evaluated in the context of the global COVID-19 pandemic, which necessitated consideration that the patient might be at risk for infection with the SARS-CoV-2 virus that causes COVID-19. Institutional protocols and algorithms that pertain to the evaluation of patients at risk for COVID-19 are in a state of rapid change based on information  released by regulatory bodies including the CDC and federal and state organizations. These policies and algorithms were followed during the patient's care in the ED.  Patient presenting for evaluation of shortness of breath, chest pain, nausea vomiting.  She is afebrile but tachycardic and hypoxic in the ED.  With any movement will drop down to the 80s on room air.  She is improved on 2.5 L supplemental oxygen via nasal cannula.  Lab work reviewed and interpreted by me shows mild nonspecific leukocytosis, no anemia, elevated creatinine but BUN within normal limits.  Her LFTs are mildly elevated.  Due to her tachycardia and hypoxia D-dimer was obtained which was elevated.  She subsequently underwent CTA of the chest which was negative for PE but shows evidence of left lower lobe pneumonia, also enlarged left pericarinal lymph node of uncertain etiology.  CT also shows soft tissue fullness in the  upper thoracic esophagus measuring approximately 5 cm.  She was given IV fluids and started on IV antibiotics.  Rapid Covid test is negative.  Spoke with Dr. Roosevelt Locks with [facility] hospitalist service who agrees to assume care of patient and bring her into the hospital for further evaluation and management.   Final Clinical Impression(s) / ED Diagnoses Final diagnoses:  Acute respiratory failure with hypoxia ([place])  Community acquired pneumonia of left lower lobe of lung  Esophageal thickening    Rx / DC Orders ED Discharge Orders    None       Debroah Baller 05/13/19 Ezzard Flax, MD 05/14/19 [phone]

## 2019-05-13 NOTE — ED Notes (Signed)
Update provided to pts Husband Paul.

## 2019-05-13 NOTE — H&P (Signed)
History and Physical    [name] [id number] DOB: 01-Sep-1965 DOA: 05/13/2019  PCP: Brunetta Jeans, PA-C   Patient coming from: Home  I have personally briefly reviewed patient's old medical records in [place]  Chief Complaint: Nauseous vomit, cough short of breath  HPI: [name] is a 54 y.o. female with medical history significant of anxiety, bipolar affective disorder, migraine headaches, OSA, GERD, chronic pain syndrome presented nausea and vomiting, cough and short of breath.  Symptoms began this morning, with strong feeling of nauseous and then 5-6 episodes of nonbloody nonbilious emesis.    And started to have nonproductive cough and short of breath.  She denies fevers or chills, no abdominal pain or chest pain. ED Course: ED negative for PE, suspect left lower lobe pneumonia, and thickening of the upper esophagus.  Review of Systems: As per HPI otherwise 10 point review of systems negative.    Past Medical History:  Diagnosis Date  . Anxiety   . Bipolar affective disorder, mixed, in partial or remission   . Depression   . Dysrhythmia    Left sided heart diease  . Headache(784.0)   . History of gallstones   . Kidney stones   . Mental disorder   . Migraine   . Pneumonia    Hx: of as a child  . PONV (postoperative nausea and vomiting)   . Renal disorder   . Sleep apnea    sleep test 2014, did not have osa  . Substance abuse (HCC)    Fentanyl, Percocet. last use 04/2011    Past Surgical History:  Procedure Laterality Date  . ABDOMINAL HYSTERECTOMY    . 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 BUNIONECTOMY     11/14/2011  . CHOLECYSTECTOMY    . COLONOSCOPY     2001, 2008.  Father has colon cancer  . FOOT SURGERY     right x 2  . NASAL SEPTOPLASTY W/ TURBINOPLASTY  Bilateral 12/04/2014   Procedure: NASAL SEPTOPLASTY WITH BILATERAL TURBINATE REDUCTION;  Surgeon: Izora Gala, MD;  Location: [facility];  Service: ENT;  Laterality: Bilateral;  . TOTAL ABDOMINAL HYSTERECTOMY W/ BILATERAL SALPINGOOPHORECTOMY    . VAGINAL HYSTERECTOMY       reports that she has never smoked. She has never used smokeless tobacco. She reports that she does not drink alcohol or use drugs.  No Known Allergies  Family History  Problem Relation Age of Onset  . Colon cancer Father 15  . Breast cancer Father   . Breast cancer Mother   . Uterine cancer Mother   . Stomach cancer Neg Hx   . Rectal cancer Neg Hx   . Esophageal cancer Neg Hx      Prior to Admission medications   Medication Sig Start Date End Date Taking? Authorizing Provider  albuterol (VENTOLIN HFA) 108 (90 Base) MCG/ACT inhaler INHALE 1 TO 2 PUFFS INTO THE LUNGS EVERY 6 HOURS AS NEEDED FOR WHEEZING OR SHORTNESS OF BREATH 08/10/18   Brunetta Jeans, PA-C  busPIRone (BUSPAR) 15 MG tablet Take 1 tablet (15 mg total) by mouth at bedtime. 09/07/18   Donnal Moat T, PA-C  cyclobenzaprine (FLEXERIL) 5 MG tablet Take 1 tablet (5 mg total) by mouth at bedtime. Patient not taking: Reported on 09/07/2018 08/01/18   Lorne Skeens,  Malachy Moan, NP  diclofenac (VOLTAREN) 50 MG EC tablet Take 1 tablet (50 mg total) by mouth 2 (two) times daily. Patient not taking: Reported on 01/03/2019 10/06/18   Nat Christen, MD  gabapentin (NEURONTIN) 300 MG capsule Take 1 capsule (300 mg total) by mouth 2 (two) times daily. Patient not taking: Reported on 01/03/2019 09/07/18 09/07/19  Donnal Moat T, PA-C  ketoconazole (NIZORAL) 2 % shampoo Apply 1 application topically 2 (two) times a week. Patient not taking: Reported on 09/07/2018 04/29/18   Fatima Blank, MD  lithium carbonate 300 MG capsule TAKE 2 CAPSULES(600 MG) BY MOUTH AT BEDTIME 03/24/19   Hurst, Dorothea Glassman, PA-C  oxyCODONE-acetaminophen (PERCOCET/ROXICET) 5-325 MG tablet  Take 1 tablet by mouth every 6 (six) hours as needed for severe pain. 10/19/18   Alroy Bailiff, Margaux, PA-C  predniSONE (DELTASONE) 10 MG tablet 3 tablets for 3 days, 2 tablets for 3 days, 1 tablet for 3 days Patient not taking: Reported on 01/03/2019 10/06/18   Nat Christen, MD  promethazine (PHENERGAN) 25 MG tablet Take 1 tablet (25 mg total) by mouth every 6 (six) hours as needed for nausea or vomiting. 05/29/18   Gareth Morgan, MD  QUEtiapine (SEROQUEL) 400 MG tablet TAKE 2 TABLETS(800 MG) BY MOUTH AT BEDTIME FOR MOOD 03/24/19   Donnal Moat T, PA-C  traMADol (ULTRAM) 50 MG tablet TK 1 T PO Q 6 H PRN P 02/16/18   [provider]  traZODone (DESYREL) 100 MG tablet TAKE 2 TABLETS(200 MG) BY MOUTH AT BEDTIME 03/24/19   Donnal Moat T, PA-C  zolpidem (AMBIEN) 10 MG tablet TAKE 1 TABLET(10 MG) BY MOUTH AT BEDTIME FOR SLEEP 03/28/19   Addison Lank, PA-C    Physical Exam: Vitals:   05/13/19 1400 05/13/19 1500 05/13/19 1530 05/13/19 1600  BP: (!) 147/83  (!) 155/99 134/68  Pulse: (!) 117 (!) 125 (!) 126 (!) 113  Resp: 14 17 16 13   Temp:      TempSrc:      SpO2: 96% 91% 92% 94%  Weight:      Height:        Constitutional: NAD, calm, comfortable Vitals:   05/13/19 1400 05/13/19 1500 05/13/19 1530 05/13/19 1600  BP: (!) 147/83  (!) 155/99 134/68  Pulse: (!) 117 (!) 125 (!) 126 (!) 113  Resp: 14 17 16 13   Temp:      TempSrc:      SpO2: 96% 91% 92% 94%  Weight:      Height:       Eyes: PERRL, lids and conjunctivae normal ENMT: Mucous membranes are moist. Posterior pharynx clear of any exudate or lesions.Normal dentition.  Neck: normal, supple, no masses, no thyromegaly Respiratory: Scattered crackles bilaterally, increased respiratory effort. No accessory muscle use.  Cardiovascular: Regular rate and rhythm, no murmurs / rubs / gallops. No extremity edema. 2+ pedal pulses. No carotid bruits.  Abdomen: no tenderness, no masses palpated. No hepatosplenomegaly. Bowel sounds positive.    Musculoskeletal: no clubbing / cyanosis. No joint deformity upper and lower extremities. Good ROM, no contractures. Normal muscle tone.  Skin: no rashes, lesions, ulcers. No induration Neurologic: No focal deficit Psychiatric: Sleepy    Labs on Admission: I have personally reviewed following labs and imaging studies  CBC: Recent Labs  Lab 05/13/19 1146  WBC 11.7*  NEUTROABS 10.4*  HGB 12.0  HCT 38.3  MCV 93.6  PLT [id number]   Basic Metabolic Panel: Recent Labs  Lab 05/13/19 1146  NA 139  K 4.1  CL 107  CO2 23  GLUCOSE 123*  BUN 13  CREATININE 1.08*  CALCIUM 9.1   GFR: Estimated Creatinine Clearance: 71.4 mL/min (A) (by C-G formula based on SCr of 1.08 mg/dL (H)). Liver Function Tests: Recent Labs  Lab 05/13/19 1146  AST 57*  ALT 84*  ALKPHOS 64  BILITOT 0.3  PROT 6.4*  ALBUMIN 3.7   Recent Labs  Lab 05/13/19 1146  LIPASE 37   No results for input(s): AMMONIA in the last 168 hours. Coagulation Profile: No results for input(s): INR, PROTIME in the last 168 hours. Cardiac Enzymes: No results for input(s): CKTOTAL, CKMB, CKMBINDEX, TROPONINI in the last 168 hours. BNP (last 3 results) No results for input(s): PROBNP in the last 8760 hours. HbA1C: No results for input(s): HGBA1C in the last 72 hours. CBG: No results for input(s): GLUCAP in the last 168 hours. Lipid Profile: No results for input(s): CHOL, HDL, LDLCALC, TRIG, CHOLHDL, LDLDIRECT in the last 72 hours. Thyroid Function Tests: No results for input(s): TSH, T4TOTAL, FREET4, T3FREE, THYROIDAB in the last 72 hours. Anemia Panel: No results for input(s): VITAMINB12, FOLATE, FERRITIN, TIBC, IRON, RETICCTPCT in the last 72 hours. Urine analysis:    Component Value Date/Time   COLORURINE YELLOW 05/29/2018 2008   APPEARANCEUR HAZY (A) 05/29/2018 2008   LABSPEC 1.020 05/29/2018 2008   PHURINE 6.0 05/29/2018 2008   GLUCOSEU NEGATIVE 05/29/2018 2008   HGBUR NEGATIVE 05/29/2018 2008   BILIRUBINUR  NEGATIVE 05/29/2018 2008   BILIRUBINUR neg 09/14/2013 [place] 05/29/2018 2008   PROTEINUR NEGATIVE 05/29/2018 2008   UROBILINOGEN 0.2 09/27/2014 2015   NITRITE NEGATIVE 05/29/2018 2008   LEUKOCYTESUR LARGE (A) 05/29/2018 2008    Radiological Exams on Admission: DG Chest 2 View  Result Date: 05/13/2019 CLINICAL DATA:  Shortness of breath. EXAM: CHEST - 2 VIEW COMPARISON:  05/29/2018. FINDINGS: Mediastinum and hilar structures normal. Heart size stable given technique. Low lung volumes. Mild right base infiltrate cannot be excluded. No pleural effusion or pneumothorax. IMPRESSION: Low lung volumes.  Mild right base infiltrate cannot be excluded. Electronically Signed   By: Marcello Moores  Register   On: 05/13/2019 11:56   CT Angio Chest PE W and/or Wo Contrast  Result Date: 05/13/2019 CLINICAL DATA:  Shortness of breath with decreased oxygen saturation and positive D-dimer EXAM: CT ANGIOGRAPHY CHEST WITH CONTRAST TECHNIQUE: Multidetector CT imaging of the chest was performed using the standard protocol during bolus administration of intravenous contrast. Multiplanar CT image reconstructions and MIPs were obtained to evaluate the vascular anatomy. CONTRAST:  157mL OMNIPAQUE IOHEXOL 350 MG/ML SOLN COMPARISON:  Chest radiograph May 13, 2019 and CT angiogram chest Jul 24, 2010 FINDINGS: Cardiovascular: There is no demonstrable pulmonary embolus. There is no thoracic aortic aneurysm or dissection. The visualized great vessels appear normal. There are occasional foci of coronary artery calcification. There is no pericardial effusion or pericardial thickening. Mediastinum/Nodes: The right lobe of the thyroid is larger than the left lobe, a stable presumed anatomic variant. No focal thyroid lesions are evident. There is a lymph node to the left of the carina measuring 1.8 x 1.3 cm. No other lymph node prominence evident. There is soft tissue thickening in the upper thoracic esophagus with mild  anterior impression on the trachea at this level. Lungs/Pleura: There is focal airspace opacity in portions of the posterior and superior segments of the left lower lobe consistent with pneumonia. There is scarring in the extreme right apex. There are scattered areas of  apparent mosaic attenuation, particularly in the right lower lobe and posterior segment of right upper lobe. No pleural effusions. Upper Abdomen: Gallbladder absent. Visualized upper abdominal structures otherwise appear unremarkable. Musculoskeletal: There are foci of degenerative change in the thoracic spine. No blastic or lytic bone lesions. No chest wall lesions. Review of the MIP images confirms the above findings. IMPRESSION: 1. No demonstrable pulmonary embolus. No thoracic aortic aneurysm or dissection. There are occasional foci of coronary artery calcification. 2. Airspace opacity in portions of the left lower lobe consistent with pneumonia. Areas of apparent mosaic attenuation likely represent foci of small airways obstructive disease. There is also mild patchy atelectasis. 3. Enlarged left pericarinal lymph node of uncertain etiology. No other lymph node prominence. 4. Apparent soft tissue fullness in the upper thoracic esophagus approximately 5 cm. Etiology for this soft tissue fullness uncertain. This finding may warrant direct visualization or contrast esophagram to further evaluate. 5.  Gallbladder absent. Electronically Signed   By: Lowella Grip III M.D.   On: 05/13/2019 15:09    EKG: Independently reviewed.  Sinus tachycardia, incomplete right bundle branch block  Assessment/Plan Active Problems:   Aspiration pneumonia (HCC)   PNA (pneumonia)  Acute hypoxic respiratory failure Probably aspiration pneumonia bacterial given patient had nauseous vomiting first and onset of her breathing symptoms acutely afterwards.  However her CT scan was not very typical for aspiration pneumonia given the location, also suspicious for  obstruction pneumonia given the CT finding indicating a enlarged left pericarinal lymph node enlargement.  Recommend repeat CAT scan in 4 weeks to document resolution of pneumonia and outpatient neurology follow-up Unasyn for now Breathing treatment with Atrovent and short course of p.o. steroids N.p.o. for tonight until mental status improves Aspiration precaution Check ABG  Acute metabolic encephalopathy Secondary to hypoxia and pneumonia As above  Spacious for esophagus lesion From CAT scan showing possible soft tissue swelling around upper esophagus As recommended by radiology, will order a barium swallow  Sleep apnea CPAP at bedtime  Bipolar disorder New home meds Avoid oversedation   DVT prophylaxis: Lovenox Code Status: Full code Family Communication: Husband phone number not valid Disposition Plan: Once on clinical progress, likely will need 2 to 3 days hospital stay Consults called: None Admission status: Telemetry admission   Lequita Halt MD [facility] Pager [phone]    05/13/2019, 5:22 PM

## 2019-05-14 ENCOUNTER — Inpatient Hospital Stay (HOSPITAL_COMMUNITY): Payer: Medicare Other

## 2019-05-14 DIAGNOSIS — K228 Other specified diseases of esophagus: Secondary | ICD-10-CM

## 2019-05-14 DIAGNOSIS — J9601 Acute respiratory failure with hypoxia: Secondary | ICD-10-CM

## 2019-05-14 DIAGNOSIS — J189 Pneumonia, unspecified organism: Secondary | ICD-10-CM

## 2019-05-14 DIAGNOSIS — R933 Abnormal findings on diagnostic imaging of other parts of digestive tract: Secondary | ICD-10-CM

## 2019-05-14 LAB — BASIC METABOLIC PANEL
Anion gap: 9 (ref 5–15)
BUN: 7 mg/dL (ref 6–20)
CO2: 25 mmol/L (ref 22–32)
Calcium: 8.9 mg/dL (ref 8.9–10.3)
Chloride: 108 mmol/L (ref 98–111)
Creatinine, Ser: 0.65 mg/dL (ref 0.44–1.00)
GFR calc Af Amer: >60 mL/min (ref >60)
GFR calc non Af Amer: >60 mL/min (ref >60)
Glucose, Bld: 134 mg/dL — ABNORMAL HIGH (ref 70–99)
Potassium: 3.9 mmol/L (ref 3.5–5.1)
Sodium: 142 mmol/L (ref 135–145)

## 2019-05-14 LAB — URINALYSIS, ROUTINE W REFLEX MICROSCOPIC
Bilirubin Urine: NEGATIVE
Glucose, UA: NEGATIVE mg/dL
Hgb urine dipstick: NEGATIVE
Ketones, ur: 5 mg/dL — AB
Nitrite: NEGATIVE
Protein, ur: NEGATIVE mg/dL
Specific Gravity, Urine: 1.012 (ref 1.005–1.030)
pH: 6 (ref 5.0–8.0)

## 2019-05-14 LAB — CBC
HCT: 37.8 % (ref 36.0–46.0)
Hemoglobin: 11.9 g/dL — ABNORMAL LOW (ref 12.0–15.0)
MCH: 29 pg (ref 26.0–34.0)
MCHC: 31.5 g/dL (ref 30.0–36.0)
MCV: 92.2 fL (ref 80.0–100.0)
Platelets: 137 10*3/uL — ABNORMAL LOW (ref 150–400)
RBC: 4.1 MIL/uL (ref 3.87–5.11)
RDW: 12.7 % (ref 11.5–15.5)
WBC: 12.8 10*3/uL — ABNORMAL HIGH (ref 4.0–10.5)
nRBC: 0 % (ref 0.0–0.2)

## 2019-05-14 LAB — GLUCOSE, CAPILLARY: Glucose-Capillary: 122 mg/dL — ABNORMAL HIGH (ref 70–99)

## 2019-05-14 LAB — HIV ANTIBODY (ROUTINE TESTING W REFLEX): HIV Screen 4th Generation wRfx: NONREACTIVE

## 2019-05-14 MED ORDER — PANTOPRAZOLE SODIUM 40 MG IV SOLR
40.0000 mg | Freq: Two times a day (BID) | INTRAVENOUS | Status: DC
  Administered 2019-05-14 – 2019-05-17 (×7): 40 mg via INTRAVENOUS
  Filled 2019-05-14 (×7): qty 40

## 2019-05-14 MED ORDER — PROMETHAZINE HCL 25 MG/ML IJ SOLN
6.2500 mg | Freq: Four times a day (QID) | INTRAMUSCULAR | Status: DC | PRN
  Administered 2019-05-14 – 2019-05-18 (×12): 6.25 mg via INTRAVENOUS
  Filled 2019-05-14 (×13): qty 1

## 2019-05-14 MED ORDER — OXYCODONE HCL 5 MG PO TABS
5.0000 mg | ORAL_TABLET | Freq: Four times a day (QID) | ORAL | Status: DC | PRN
  Administered 2019-05-14 – 2019-05-15 (×3): 5 mg via ORAL
  Filled 2019-05-14 (×3): qty 1

## 2019-05-14 MED ORDER — IPRATROPIUM-ALBUTEROL 0.5-2.5 (3) MG/3ML IN SOLN: 3.0000 mL | Freq: Four times a day (QID) | RESPIRATORY_TRACT | Status: DC | PRN

## 2019-05-14 MED ORDER — GUAIFENESIN ER 600 MG PO TB12
600.0000 mg | ORAL_TABLET | Freq: Two times a day (BID) | ORAL | Status: DC
  Administered 2019-05-14 – 2019-05-18 (×8): 600 mg via ORAL
  Filled 2019-05-14 (×8): qty 1

## 2019-05-14 MED ORDER — IPRATROPIUM-ALBUTEROL 0.5-2.5 (3) MG/3ML IN SOLN
3.0000 mL | Freq: Four times a day (QID) | RESPIRATORY_TRACT | Status: DC
  Administered 2019-05-14: 3 mL via RESPIRATORY_TRACT
  Filled 2019-05-14: qty 3

## 2019-05-14 NOTE — Progress Notes (Signed)
Patient does not want to wear CPAP) for tonight 

## 2019-05-14 NOTE — Consult Note (Signed)
Consultation  Referring Provider: [facility] hospitalist/Dr. Regalato  primary Care Physician:  Brunetta Jeans, PA-C Primary Gastroenterologist:  Dr.Jacobs  Reason for Consultation: Aspiration pneumonia, abnormal esophagus on CT  HPI: [name] is a 54 y.o. female, admitted through the emergency room yesterday with complaints of 1 to 2-day history of nausea vomiting cough and shortness of breath. She is known to Dr. Ardis Hughs from prior colonoscopies.,  Due to family history of colon cancer in her father. Last colonoscopy was done in 2015 and was negative.  No prior EGD. Work-up in the ER with chest x-ray with mild right base infiltrate. CT angio of the chest showed no PE, is a left lower lobe pneumonia, enlarged left pericarinal lymph node, and soft tissue fullness in the upper thoracic esophagus approximately 5 cm etiology for soft tissue fullness uncertain, gallbladder absent.  Patient says she has not had any prior EGD.  She does have occasional heartburn and indigestion, does not require medication.  She has been having intermittent episodes of strangling or "choking" on food over the past several months.  She says this generally happens with meat or rice.  She may swallow, feel choked and have to regurgitate.  She does not have any regular coughing with swallowing.  She denies any odynophagia.  No abdominal pain.  Labs; WBC 11.7, hemoglobin 12 creatinine 1.08 Liver function studies-AST 57/ALT 84 D-dimer 0.8 COVID-19 negative ABG last p.m.-pH 7.265/PCO2 53/PO2 64  She feels about the same today tenuous to feel some shortness of breath and has been coughing.  Barium swallow pending     Past Medical History:  Diagnosis Date  . Anxiety   . Bipolar affective disorder, mixed, in partial or remission   . Depression   . Dysrhythmia    Left sided heart diease  . Headache(784.0)   . History of gallstones   . Kidney stones   . Mental disorder   . Migraine   . Pneumonia    Hx: of as a child  . PONV (postoperative nausea and vomiting)   . Renal disorder   . Sleep apnea    sleep test 2014, did not have osa  . Substance abuse (HCC)    Fentanyl, Percocet. last use 04/2011    Past Surgical History:  Procedure Laterality Date  . ABDOMINAL HYSTERECTOMY    . 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 BUNIONECTOMY     11/14/2011  . CHOLECYSTECTOMY    . COLONOSCOPY     2001, 2008.  Father has colon cancer  . FOOT SURGERY     right x 2  . NASAL SEPTOPLASTY W/ TURBINOPLASTY Bilateral 12/04/2014   Procedure: NASAL SEPTOPLASTY WITH BILATERAL TURBINATE REDUCTION;  Surgeon: Izora Gala, MD;  Location: [facility];  Service: ENT;  Laterality: Bilateral;  . TOTAL ABDOMINAL HYSTERECTOMY W/ BILATERAL SALPINGOOPHORECTOMY    . VAGINAL HYSTERECTOMY      Prior to Admission medications   Medication Sig Start Date End Date Taking? Authorizing Provider  albuterol (VENTOLIN HFA) 108 (90 Base) MCG/ACT inhaler INHALE 1 TO 2 PUFFS INTO THE LUNGS EVERY 6 HOURS AS NEEDED FOR WHEEZING OR SHORTNESS OF BREATH 08/10/18  Yes Brunetta Jeans, PA-C  cyclobenzaprine (FLEXERIL) 10 MG tablet Take 10 mg by mouth at bedtime as needed for muscle spasms.  03/25/19  Yes  [provider]  lithium carbonate 300 MG capsule TAKE 2 CAPSULES(600 MG) BY MOUTH AT BEDTIME 03/24/19  Yes Hurst, Teresa T, PA-C  pregabalin (LYRICA) 75 MG capsule Take 75 mg by mouth in the morning and at bedtime. 05/09/19  Yes [provider]  QUEtiapine (SEROQUEL) 400 MG tablet TAKE 2 TABLETS(800 MG) BY MOUTH AT BEDTIME FOR MOOD 03/24/19  Yes Hurst, Teresa T, PA-C  traMADol (ULTRAM) 50 MG tablet TK 1 T PO Q 6 H PRN P 02/16/18  Yes [provider]  traZODone (DESYREL) 100 MG tablet TAKE 2 TABLETS(200 MG) BY MOUTH AT BEDTIME  03/24/19  Yes Hurst, Teresa T, PA-C  zolpidem (AMBIEN) 10 MG tablet TAKE 1 TABLET(10 MG) BY MOUTH AT BEDTIME FOR SLEEP 03/28/19  Yes Hurst, Teresa T, PA-C  busPIRone (BUSPAR) 15 MG tablet Take 1 tablet (15 mg total) by mouth at bedtime. Patient not taking: Reported on 05/13/2019 09/07/18   Donnal Moat T, PA-C  cyclobenzaprine (FLEXERIL) 5 MG tablet Take 1 tablet (5 mg total) by mouth at bedtime. Patient not taking: Reported on 09/07/2018 08/01/18   Augusto Gamble B, NP  diclofenac (VOLTAREN) 50 MG EC tablet Take 1 tablet (50 mg total) by mouth 2 (two) times daily. Patient not taking: Reported on 01/03/2019 10/06/18   Nat Christen, MD  gabapentin (NEURONTIN) 300 MG capsule Take 1 capsule (300 mg total) by mouth 2 (two) times daily. Patient not taking: Reported on 01/03/2019 09/07/18 09/07/19  Donnal Moat T, PA-C  ketoconazole (NIZORAL) 2 % shampoo Apply 1 application topically 2 (two) times a week. Patient not taking: Reported on 09/07/2018 04/29/18   Fatima Blank, MD  oxyCODONE-acetaminophen (PERCOCET/ROXICET) 5-325 MG tablet Take 1 tablet by mouth every 6 (six) hours as needed for severe pain. Patient not taking: Reported on 05/13/2019 10/19/18   Eustaquio Maize, PA-C  predniSONE (DELTASONE) 10 MG tablet 3 tablets for 3 days, 2 tablets for 3 days, 1 tablet for 3 days Patient not taking: Reported on 01/03/2019 10/06/18   Nat Christen, MD  promethazine (PHENERGAN) 25 MG tablet Take 1 tablet (25 mg total) by mouth every 6 (six) hours as needed for nausea or vomiting. Patient not taking: Reported on 05/13/2019 05/29/18   Gareth Morgan, MD    Current Facility-Administered Medications  Medication Dose Route Frequency Provider Last Rate Last Admin  . 0.9 %  sodium chloride infusion   Intravenous Continuous Wynetta Fines T, MD 100 mL/hr at 05/14/19 0732 New Bag at 05/14/19 0732  . acetaminophen (TYLENOL) tablet 650 mg  650 mg Oral Q6H PRN Wynetta Fines T, MD   650 mg at 05/14/19 0012   Or  . acetaminophen  (TYLENOL) suppository 650 mg  650 mg Rectal Q6H PRN Wynetta Fines T, MD      . Ampicillin-Sulbactam (UNASYN) 3 g in sodium chloride 0.9 % 100 mL IVPB  3 g Intravenous Q6H Wynetta Fines T, MD 200 mL/hr at 05/14/19 0522 3 g at 05/14/19 0522  . enoxaparin (LOVENOX) injection 40 mg  40 mg Subcutaneous QHS Wynetta Fines T, MD   40 mg at 05/14/19 0012  . guaiFENesin (MUCINEX) 12 hr tablet 600 mg  600 mg Oral BID Regalado, Belkys A, MD   600 mg at 05/14/19 0834  . ipratropium-albuterol (DUONEB) 0.5-2.5 (3) MG/3ML nebulizer solution 3 mL  3 mL Nebulization Q6H Regalado, Belkys A, MD      . lithium carbonate capsule 600 mg  600 mg Oral QHS Lequita Halt, MD   600  mg at 05/14/19 0034  . oxyCODONE (Oxy IR/ROXICODONE) immediate release tablet 5 mg  5 mg Oral Q4H PRN Wynetta Fines T, MD   5 mg at 05/14/19 0755  . pantoprazole (PROTONIX) injection 40 mg  40 mg Intravenous Q12H Regalado, Belkys A, MD   40 mg at 05/14/19 0834  . predniSONE (DELTASONE) tablet 40 mg  40 mg Oral Q breakfast Wynetta Fines T, MD   40 mg at 05/14/19 0012  . promethazine (PHENERGAN) injection 6.25 mg  6.25 mg Intravenous Q6H PRN Regalado, Belkys A, MD      . QUEtiapine (SEROQUEL) tablet 400 mg  400 mg Oral QHS Wynetta Fines T, MD   400 mg at 05/14/19 0011  . traZODone (DESYREL) tablet 200 mg  200 mg Oral QHS Wynetta Fines T, MD   200 mg at 05/14/19 0011  . zolpidem (AMBIEN) tablet 5 mg  5 mg Oral QHS PRN Lequita Halt, MD        Allergies as of 05/13/2019  . (No Known Allergies)    Family History  Problem Relation Age of Onset  . Colon cancer Father 38  . Breast cancer Father   . Breast cancer Mother   . Uterine cancer Mother   . Stomach cancer Neg Hx   . Rectal cancer Neg Hx   . Esophageal cancer Neg Hx     Social History   Socioeconomic History  . Marital status: Married    Spouse name: Not on file  . Number of children: 6  . Years of education: Not on file  . Highest education level: Not on file  Occupational History  .  Occupation: Presenter, broadcasting  Tobacco Use  . Smoking status: Never Smoker  . Smokeless tobacco: Never Used  Substance and Sexual Activity  . Alcohol use: No    Alcohol/week: 0.0 standard drinks  . Drug use: No    Types: Other-see comments, Fentanyl, Hydrocodone  . Sexual activity: Not Currently    Birth control/protection: Surgical  Other Topics Concern  . 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 Difficulty of Paying Living Expenses: Not on file  Food Insecurity:   . Worried About Charity fundraiser in the Last Year: Not on file  . Ran Out of Food in the Last Year: Not on file  Transportation Needs:   . Lack of Transportation (Medical): Not on file  . Lack of Transportation (Non-Medical): Not on file  Physical Activity:   . Days of Exercise per Week: Not on file  . Minutes of Exercise per Session: Not on file  Stress:   . Feeling of Stress : Not on file  Social Connections:   . Frequency of Communication with Friends and Family: Not on file  . Frequency of Social Gatherings with Friends and Family: Not on file  . Attends Religious Services: Not on file  . Active Member of Clubs or Organizations: Not on file  . Attends Archivist Meetings: Not on file  . Marital Status: Not on file  Intimate  Partner Violence:   . Fear of Current or Ex-Partner: Not on file  . Emotionally Abused: Not on file  . Physically Abused: Not on file  . Sexually Abused: Not on file    Review of Systems: Pertinent positive and negative review of systems were noted in the above HPI section.  All other review of systems was otherwise negative.  Physical Exam:  Vital signs in last 24 hours: Temp:  [98.3 F (36.8 C)-99.3 F (37.4 C)] 98.4 F (36.9 C) (02/27 0405) Pulse Rate:  [93-126] 99 (02/27 0405) Resp:  [13-22] 15 (02/27 0127) BP: (127-157)/(64-99) 139/86 (02/27 0405) SpO2:  [91 %-100 %] 99 % (02/27 0405) Weight:  [95.3 kg] 95.3 kg (02/26 1123) Last BM Date: (pt unable to recall) General:   Alert,  Well-developed, well-nourished, white female pleasant and cooperative in NAD Head:  Normocephalic and atraumatic. Eyes:  Sclera clear, no icterus.   Conjunctiva pink. Ears:  Normal auditory acuity. Nose:  No deformity, discharge,  or lesions. Mouth:  No deformity or lesions.   Neck:  Supple; no masses or thyromegaly. Lungs:   Bilateral rhonchi in the bases decreased breath sounds left base. Heart:  Regular rate and rhythm; no murmurs, clicks, rubs,  or gallops. Abdomen:  Soft,nontender, BS active,nonpalp mass or hsm.   Rectal:  Deferred  Msk:  Symmetrical without gross deformities. . Pulses:  Normal pulses noted. Extremities:  Without clubbing or edema. Neurologic:  Alert and  oriented x4;  grossly normal neurologically. Skin:  Intact without significant lesions or rashes.. Psych:  Alert and cooperative. Normal mood and affect.  Intake/Output from previous day: 02/26 0701 - 02/27 0700 In: 1541.4 [IV Piggyback:1541.4] Out: 1000 [Urine:1000] Intake/Output this shift: No intake/output data recorded.  Lab Results: Recent Labs    05/13/19 1146 05/14/19 0311  WBC 11.7* 12.8*  HGB 12.0 11.9*  HCT 38.3 37.8  PLT 130* 137*   BMET Recent Labs    05/13/19 1146 05/14/19 0311  NA 139 142  K 4.1 3.9  CL 107 108  CO2 23 25  GLUCOSE 123* 134*  BUN 13 7  CREATININE 1.08* 0.65  CALCIUM 9.1 8.9   LFT Recent Labs    05/13/19 1146  PROT 6.4*  ALBUMIN 3.7  AST 57*  ALT 84*  ALKPHOS 64  BILITOT 0.3   PT/INR No results for input(s): LABPROT, INR in the last 72 hours. Hepatitis Panel No results for input(s): HEPBSAG, HCVAB,  HEPAIGM, HEPBIGM in the last 72 hours.    IMPRESSION:  #29 54 year old white female admitted with aspiration pneumonia with left lower lobe infiltrate on CT. Patient also noted to have soft tissue thickening of the upper thoracic esophagus on CT with mild anterior pressure on the trachea, and has a 1.8 x 1.3 cm node left of the carina.  She has been having intermittent episodes of coughing and choking with eating primarily with meats and rice and requiring regurgitation.  These episodes have been occurring at least over the past several months.  Rule out upper esophageal neoplasm  #2 hypoxia secondary to above #3 acute metabolic encephalopathy secondary to above #4 history of bipolar disorders and anxiety 5 chronic pain syndrome 6 family history of colon cancer in patient's father-patient up-to-date with colonoscopy last done 2015 and negative  Plan; we will follow up on barium swallow today Full liquid diet Continue IV PPI twice daily She will need EGD this admission when she has significantly improved from a pulmonary standpoint in order to allow safe  sedation. Thank you will follow with you      EsterwoodPA-C  05/14/2019, 10:50 AM

## 2019-05-14 NOTE — Progress Notes (Signed)
PROGRESS NOTE    [name]  [id number] DOB: 06-01-1965 DOA: 05/13/2019 PCP: Brunetta Jeans, PA-C   Brief Narrative: 54 year old with past medical history significant for anxiety, bipolar, migraine, OSA, chronic pain syndrome who presented with nausea and vomiting and cough and shortness of breath.  The symptoms started the morning of admission.  She vomited 5-6 times nonbilious emesis.  She also started to have a nonproductive cough and shortness of breath after.  Patient in the ED CT angio negative for PE left lower lobe pneumonia and thickening of the upper esophagus.   Assessment & Plan:   Active Problems:   Aspiration pneumonia (HCC)   PNA (pneumonia)  1-Acute hypoxic respiratory failure, secondary to pneumonia probably aspiration pneumonia: -Continue with Unasyn -Start scheduled nebulizer treatment.   2-Aspiration pneumonia; Suspect related to vomiting, also CT show abnormal tissue upper esophagus. Swallow evaluation prior esophagogram.  3-Soft tissue swelling in the upper esophagus: GI consulted. Esophagogram was initially ordered, after discussed case with radiologist.  We will proceed first with a speech and swallow evaluation prior to an esophagogram.  We could also consider doing endoscopy first.  4-Acute metabolic encephalopathy: Secondary to hypoxemia and infectious process.  Treating underlying cause. Still confused.  5-Sleep apnea: CPAP  Bipolar disorder continue with home medication.  History of intentional opioid overdose.  Patient reported to the nurse that she had an online account where she came by tramadol.  She will take 10 tramadol over the course of days for pain.\ Social worker consulted for drug counseling  Estimated body mass index is 32.89 kg/m as calculated from the following:   Height as of this encounter: 5\' 7"  (1.702 m).   Weight as of this encounter: 95.3 kg.   DVT prophylaxis: Lovenox Code Status: Full code Family  Communication: Care discussed with patient Disposition Plan:  Patient is from: Home Anticipated d/c date: 2 to 3 days Barriers to d/c or necessity for inpatient status: Remain inpatient for treatment of pneumonia, concern for aspiration and abnormal esophagus on CT scan  Consultants:   GI  Procedures:   None  Antimicrobials:  Unasyn  Subjective: Patient is alert, slowly answering questions.  She reports cough, nausea vomiting multiple episodes the day of admission.  She denies difficulty swallowing. Per nurse patient has been confused..  Objective: Vitals:   05/13/19 1837 05/13/19 2028 05/14/19 0127 05/14/19 0405  BP: (!) 157/94 (!) 143/80  139/86  Pulse: (!) 110 93  99  Resp: 18 16 15    Temp: 99.3 F (37.4 C) 98.6 F (37 C)  98.4 F (36.9 C)  TempSrc: Oral Oral  Oral  SpO2: 100% 97%  99%  Weight:      Height:        Intake/Output Summary (Last 24 hours) at 05/14/2019 0820 Last data filed at 05/14/2019 0500 Gross per 24 hour  Intake 1541.38 ml  Output 1000 ml  Net 541.38 ml   Filed Weights   05/13/19 1123  Weight: 95.3 kg    Examination:  General exam: Appears calm and comfortable  Respiratory system: Bilateral rhonchorous. Respiratory effort normal. Cardiovascular system: S1 & S2 heard, RRR.  Gastrointestinal system: Abdomen is nondistended, soft and nontender. No organomegaly or masses felt. Normal bowel sounds heard. Central nervous system: Alert and oriented.  Extremities: Symmetric 5 x 5 power. Skin: No rashes, lesions or ulcers   Data Reviewed: I have personally reviewed following labs and imaging studies  CBC: Recent Labs  Lab 05/13/19 1146 05/14/19 0311  WBC 11.7* 12.8*  NEUTROABS 10.4*  --   HGB 12.0 11.9*  HCT 38.3 37.8  MCV 93.6 92.2  PLT 130* 0000000*   Basic Metabolic Panel: Recent Labs  Lab 05/13/19 1146 05/14/19 0311  NA 139 142  K 4.1 3.9  CL 107 108  CO2 23 25  GLUCOSE 123* 134*  BUN 13 7  CREATININE 1.08* 0.65  CALCIUM  9.1 8.9   GFR: Estimated Creatinine Clearance: 96.4 mL/min (by C-G formula based on SCr of 0.65 mg/dL). Liver Function Tests: Recent Labs  Lab 05/13/19 1146  AST 57*  ALT 84*  ALKPHOS 64  BILITOT 0.3  PROT 6.4*  ALBUMIN 3.7   Recent Labs  Lab 05/13/19 1146  LIPASE 37   No results for input(s): AMMONIA in the last 168 hours. Coagulation Profile: No results for input(s): INR, PROTIME in the last 168 hours. Cardiac Enzymes: No results for input(s): CKTOTAL, CKMB, CKMBINDEX, TROPONINI in the last 168 hours. BNP (last 3 results) No results for input(s): PROBNP in the last 8760 hours. HbA1C: No results for input(s): HGBA1C in the last 72 hours. CBG: Recent Labs  Lab 05/13/19 2021 05/13/19 2347 05/14/19 0351  GLUCAP 127* 120* 122*   Lipid Profile: No results for input(s): CHOL, HDL, LDLCALC, TRIG, CHOLHDL, LDLDIRECT in the last 72 hours. Thyroid Function Tests: No results for input(s): TSH, T4TOTAL, FREET4, T3FREE, THYROIDAB in the last 72 hours. Anemia Panel: No results for input(s): VITAMINB12, FOLATE, FERRITIN, TIBC, IRON, RETICCTPCT in the last 72 hours. Sepsis Labs: No results for input(s): PROCALCITON, LATICACIDVEN in the last 168 hours.  Recent Results (from the past 240 hour(s))  SARS CORONAVIRUS 2 (TAT 6-24 HRS) Nasopharyngeal Nasopharyngeal Swab     Status: None   Collection Time: 05/13/19  4:00 PM   Specimen: Nasopharyngeal Swab  Result Value Ref Range Status   SARS Coronavirus 2 NEGATIVE NEGATIVE Final    Comment: (NOTE) SARS-CoV-2 target nucleic acids are NOT DETECTED. The SARS-CoV-2 RNA is generally detectable in upper and lower respiratory specimens during the acute phase of infection. Negative results do not preclude SARS-CoV-2 infection, do not rule out co-infections with other pathogens, and should not be used as the sole basis for treatment or other patient management decisions. Negative results must be combined with clinical  observations, patient history, and epidemiological information. The expected result is Negative. Fact Sheet for Patients: SugarRoll.be Fact Sheet for Healthcare Providers: https://www.woods-mathews.com/ This test is not yet approved or cleared by the [place] FDA and  has been authorized for detection and/or diagnosis of SARS-CoV-2 by FDA under an Emergency Use Authorization (EUA). This EUA will remain  in effect (meaning this test can be used) for the duration of the COVID-19 declaration under Section 56 4(b)(1) of the Act, 21 U.S.C. section 360bbb-3(b)(1), unless the authorization is terminated or revoked sooner. Performed at [facility], [place] [address] [place], [place] [address]          Radiology Studies: DG Chest 2 View  Result Date: 05/13/2019 CLINICAL DATA:  Shortness of breath. EXAM: CHEST - 2 VIEW COMPARISON:  05/29/2018. FINDINGS: Mediastinum and hilar structures normal. Heart size stable given technique. Low lung volumes. Mild right base infiltrate cannot be excluded. No pleural effusion or pneumothorax. IMPRESSION: Low lung volumes.  Mild right base infiltrate cannot be excluded. Electronically Signed   By: Marcello Moores  Register   On: 05/13/2019 11:56   CT Angio Chest PE W and/or Wo Contrast  Result Date: 05/13/2019 CLINICAL DATA:  Shortness of breath with decreased oxygen saturation and positive D-dimer EXAM: CT ANGIOGRAPHY CHEST WITH CONTRAST TECHNIQUE: Multidetector CT imaging of the chest was performed using the standard protocol during bolus administration of intravenous contrast. Multiplanar CT image reconstructions and MIPs were obtained to evaluate the vascular anatomy. CONTRAST:  155mL OMNIPAQUE IOHEXOL 350 MG/ML SOLN COMPARISON:  Chest radiograph May 13, 2019 and CT angiogram chest Jul 24, 2010 FINDINGS: Cardiovascular: There is no demonstrable pulmonary embolus. There is no thoracic aortic aneurysm or dissection.  The visualized great vessels appear normal. There are occasional foci of coronary artery calcification. There is no pericardial effusion or pericardial thickening. Mediastinum/Nodes: The right lobe of the thyroid is larger than the left lobe, a stable presumed anatomic variant. No focal thyroid lesions are evident. There is a lymph node to the left of the carina measuring 1.8 x 1.3 cm. No other lymph node prominence evident. There is soft tissue thickening in the upper thoracic esophagus with mild anterior impression on the trachea at this level. Lungs/Pleura: There is focal airspace opacity in portions of the posterior and superior segments of the left lower lobe consistent with pneumonia. There is scarring in the extreme right apex. There are scattered areas of apparent mosaic attenuation, particularly in the right lower lobe and posterior segment of right upper lobe. No pleural effusions. Upper Abdomen: Gallbladder absent. Visualized upper abdominal structures otherwise appear unremarkable. Musculoskeletal: There are foci of degenerative change in the thoracic spine. No blastic or lytic bone lesions. No chest wall lesions. Review of the MIP images confirms the above findings. IMPRESSION: 1. No demonstrable pulmonary embolus. No thoracic aortic aneurysm or dissection. There are occasional foci of coronary artery calcification. 2. Airspace opacity in portions of the left lower lobe consistent with pneumonia. Areas of apparent mosaic attenuation likely represent foci of small airways obstructive disease. There is also mild patchy atelectasis. 3. Enlarged left pericarinal lymph node of uncertain etiology. No other lymph node prominence. 4. Apparent soft tissue fullness in the upper thoracic esophagus approximately 5 cm. Etiology for this soft tissue fullness uncertain. This finding may warrant direct visualization or contrast esophagram to further evaluate. 5.  Gallbladder absent. Electronically Signed   By: Lowella Grip III M.D.   On: 05/13/2019 15:09        Scheduled Meds: . enoxaparin (LOVENOX) injection  40 mg Subcutaneous QHS  . guaiFENesin  600 mg Oral BID  . ipratropium-albuterol  3 mL Nebulization Q6H  . lithium carbonate  600 mg Oral QHS  . pantoprazole (PROTONIX) IV  40 mg Intravenous Q12H  . predniSONE  40 mg Oral Q breakfast  . QUEtiapine  400 mg Oral QHS  . traZODone  200 mg Oral QHS   Continuous Infusions: . sodium chloride 100 mL/hr at 05/14/19 0732  . ampicillin-sulbactam (UNASYN) IV 3 g (05/14/19 0522)     LOS: 1 day    Time spent: 35 minutes.     Elmarie Shiley, MD [facility]   If 7PM-7AM, please contact night-coverage [email or web address]  05/14/2019, 8:20 AM

## 2019-05-14 NOTE — Evaluation (Addendum)
Clinical/Bedside Swallow Evaluation Patient Details  Name: [name] MRN: [id number] Date of Birth: 1965/06/20  Today's Date: 05/14/2019 Time: SLP Start Time (ACUTE ONLY): [id number] SLP Stop Time (ACUTE ONLY): 1743 SLP Time Calculation (min) (ACUTE ONLY): 14 min  Past Medical History:  Past Medical History:  Diagnosis Date  . Anxiety   . Bipolar affective disorder, mixed, in partial or remission   . Depression   . Dysrhythmia    Left sided heart diease  . Headache(784.0)   . History of gallstones   . Kidney stones   . Mental disorder   . Migraine   . Pneumonia    Hx: of as a child  . PONV (postoperative nausea and vomiting)   . Renal disorder   . Sleep apnea    sleep test 2014, did not have osa  . 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 ABDOMINAL HYSTERECTOMY    . 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 BUNIONECTOMY     11/14/2011  . CHOLECYSTECTOMY    . COLONOSCOPY     2001, 2008.  Father has colon cancer  . FOOT SURGERY     right x 2  . NASAL SEPTOPLASTY W/ TURBINOPLASTY Bilateral 12/04/2014   Procedure: NASAL SEPTOPLASTY WITH BILATERAL TURBINATE REDUCTION;  Surgeon: Izora Gala, MD;  Location: [facility];  Service: ENT;  Laterality: Bilateral;  . TOTAL ABDOMINAL HYSTERECTOMY W/ BILATERAL SALPINGOOPHORECTOMY    . VAGINAL HYSTERECTOMY     HPI:  [name] is a 54 y.o. female with medical history significant of anxiety, bipolar affective disorder, migraine headaches, OSA, GERD, chronic pain syndrome presented nausea and vomiting, cough and short of breath with suspected aspiration during episodes of emesis leading to aspiration PNA.    Assessment / Plan / Recommendation Clinical  Impression  Pt was seen for a bedside swallow evaluation and she presents with suspected esophageal dysphagia.  Pt reported episodes of coughing/choking with liquids and solids beginning in the past few days, but denied difficulty with PO consumption at home (she reported a different hx to GI PA per chart review).  Oral mechanism exam was [facility].  She consumed trials of ice chips, thin liquid, puree, and a small trial of regular solids.  Pt is currently on a full liquid diet per GI recommendations.  Pt exhibited prolonged mastication and AP transport with the regular solid trial, but no oral residue was observed.   A delayed cough was observed following 1/1 regular solids trials and following 1/2 puree trials.  Suspect an esophageal etiology over pharyngeal dysfunction.  No overt s/sx of aspiration were observed with thin liquid trials (tsp, cup, straw) despite challenging.  Pt denied globus sensation and odynophagia.  Recommend continuation of full liquids at this time.  SLP will continue to f/u to monitor diet tolerance and to determine safest/least restrictive solid foods when pt is appropriate to upgrade diet per GI recommendations.   SLP Visit Diagnosis: Dysphagia, unspecified (R13.10)    Aspiration Risk  Mild aspiration risk    Diet Recommendation (Full liquids )   Liquid Administration via: Cup;Straw Medication Administration: Whole meds with liquid Supervision: Patient able to self feed Compensations: Small sips/bites;Slow rate Postural Changes: Seated upright at  90 degrees;Remain upright for at least 30 minutes after po intake    Other  Recommendations Oral Care Recommendations: Oral care BID   Follow up Recommendations None      Frequency and Duration min 1 x/week  2 weeks       Prognosis Prognosis for Safe Diet Advancement: Good      Swallow Study   General HPI: [name] is a 54 y.o. female with medical history significant of anxiety, bipolar affective disorder, migraine  headaches, OSA, GERD, chronic pain syndrome presented nausea and vomiting, cough and short of breath with supsected aspiration during episodes of emesis leading to aspiration PNA.  Type of Study: Bedside Swallow Evaluation Previous Swallow Assessment: none Diet Prior to this Study: Other (Comment);Thin liquids(Full liquids ) Temperature Spikes Noted: Yes Respiratory Status: Room air History of Recent Intubation: No Behavior/Cognition: Alert;Cooperative Oral Cavity Assessment: Within Functional Limits Oral Care Completed by SLP: No Oral Cavity - Dentition: Adequate natural dentition Vision: Functional for self-feeding Self-Feeding Abilities: Able to feed self;Needs set up Patient Positioning: Upright in bed Baseline Vocal Quality: Normal Volitional Cough: Strong;Congested Volitional Swallow: Able to elicit    Oral/Motor/Sensory Function Overall Oral Motor/Sensory Function: Within functional limits   Ice Chips Ice chips: Within functional limits Presentation: Spoon   Thin Liquid Thin Liquid: Within functional limits Presentation: Cup;Spoon;Straw    Nectar Thick Nectar Thick Liquid: Not tested   Honey Thick Honey Thick Liquid: Not tested   Puree Puree: Impaired Presentation: Spoon Pharyngeal Phase Impairments: Cough - Delayed   Solid     Solid: Impaired Presentation: Spoon Oral Phase Impairments: Impaired mastication Oral Phase Functional Implications: Prolonged oral transit;Impaired mastication Pharyngeal Phase Impairments: Cough - Delayed     [name] M.S., CCC-SLP Acute Rehabilitation Services Office: [phone]  [place] 05/14/2019,5:58 PM

## 2019-05-15 LAB — CBC
HCT: 34.6 % — ABNORMAL LOW (ref 36.0–46.0)
Hemoglobin: 11.1 g/dL — ABNORMAL LOW (ref 12.0–15.0)
MCH: 29.6 pg (ref 26.0–34.0)
MCHC: 32.1 g/dL (ref 30.0–36.0)
MCV: 92.3 fL (ref 80.0–100.0)
Platelets: 157 10*3/uL (ref 150–400)
RBC: 3.75 MIL/uL — ABNORMAL LOW (ref 3.87–5.11)
RDW: 12.6 % (ref 11.5–15.5)
WBC: 7.4 10*3/uL (ref 4.0–10.5)
nRBC: 0 % (ref 0.0–0.2)

## 2019-05-15 LAB — BASIC METABOLIC PANEL
Anion gap: 7 (ref 5–15)
BUN: 7 mg/dL (ref 6–20)
CO2: 24 mmol/L (ref 22–32)
Calcium: 8.5 mg/dL — ABNORMAL LOW (ref 8.9–10.3)
Chloride: 112 mmol/L — ABNORMAL HIGH (ref 98–111)
Creatinine, Ser: 0.77 mg/dL (ref 0.44–1.00)
GFR calc Af Amer: >60 mL/min (ref >60)
GFR calc non Af Amer: >60 mL/min (ref >60)
Glucose, Bld: 105 mg/dL — ABNORMAL HIGH (ref 70–99)
Potassium: 3.2 mmol/L — ABNORMAL LOW (ref 3.5–5.1)
Sodium: 143 mmol/L (ref 135–145)

## 2019-05-15 LAB — GLUCOSE, CAPILLARY: Glucose-Capillary: 94 mg/dL (ref 70–99)

## 2019-05-15 MED ORDER — POTASSIUM CHLORIDE CRYS ER 20 MEQ PO TBCR
20.0000 meq | EXTENDED_RELEASE_TABLET | Freq: Once | ORAL | Status: AC
  Administered 2019-05-15: 20 meq via ORAL
  Filled 2019-05-15: qty 1

## 2019-05-15 MED ORDER — LIP MEDEX EX OINT
TOPICAL_OINTMENT | CUTANEOUS | Status: DC | PRN
  Filled 2019-05-15: qty 7

## 2019-05-15 MED ORDER — ENSURE ENLIVE PO LIQD
237.0000 mL | Freq: Two times a day (BID) | ORAL | Status: DC
  Administered 2019-05-16 – 2019-05-18 (×3): 237 mL via ORAL

## 2019-05-15 MED ORDER — TRAMADOL HCL 50 MG PO TABS
50.0000 mg | ORAL_TABLET | Freq: Three times a day (TID) | ORAL | Status: DC | PRN
  Administered 2019-05-16 – 2019-05-17 (×2): 50 mg via ORAL
  Filled 2019-05-15 (×2): qty 1

## 2019-05-15 MED ORDER — POTASSIUM CHLORIDE 10 MEQ/100ML IV SOLN
10.0000 meq | INTRAVENOUS | Status: AC
  Administered 2019-05-15 (×3): 10 meq via INTRAVENOUS
  Filled 2019-05-15 (×3): qty 100

## 2019-05-15 MED ORDER — POTASSIUM CHLORIDE CRYS ER 20 MEQ PO TBCR
40.0000 meq | EXTENDED_RELEASE_TABLET | Freq: Once | ORAL | Status: AC
  Administered 2019-05-15: 40 meq via ORAL
  Filled 2019-05-15: qty 2

## 2019-05-15 MED ORDER — FUROSEMIDE 40 MG PO TABS
40.0000 mg | ORAL_TABLET | Freq: Once | ORAL | Status: AC
  Administered 2019-05-15: 40 mg via ORAL
  Filled 2019-05-15: qty 1

## 2019-05-15 MED ORDER — PROMETHAZINE HCL 25 MG/ML IJ SOLN
6.2500 mg | Freq: Once | INTRAMUSCULAR | Status: AC
  Administered 2019-05-15: 13:00:00 6.25 mg via INTRAVENOUS

## 2019-05-15 NOTE — Progress Notes (Signed)
PROGRESS NOTE    [name]  [id number] DOB: 10/24/1965 DOA: 05/13/2019 PCP: Brunetta Jeans, PA-C   Brief Narrative: 54 year old with past medical history significant for anxiety, bipolar, migraine, OSA, chronic pain syndrome who presented with nausea and vomiting and cough and shortness of breath.  The symptoms started the morning of admission.  She vomited 5-6 times nonbilious emesis.  She also started to have a nonproductive cough and shortness of breath after.  Patient in the ED CT angio negative for PE left lower lobe pneumonia and thickening of the upper esophagus.   Assessment & Plan:   Active Problems:   Schizoaffective disorder, bipolar type (HCC)   Aspiration pneumonia (HCC)   PNA (pneumonia)   Community acquired pneumonia of left lower lobe of lung   Esophageal thickening   Abnormal finding on GI tract imaging  1-Acute hypoxic respiratory failure, secondary to pneumonia probably aspiration pneumonia: -Continue with Unasyn -Start scheduled nebulizer treatment. Now on room air.   2-Aspiration pneumonia; Suspect related to vomiting, also CT show abnormal tissue upper esophagus. No significant evidence of pharyngeal dysphagia.  Plan to proceed with endoscopy in few days.   3-Soft tissue swelling in the upper esophagus: GI consulted. Esophagogram was initially ordered, after discussed case with radiologist.  We will proceed first with a speech and swallow evaluation prior to an esophagogram.  We could also consider doing endoscopy first. Plan to proceed with endoscopy in few days   4-Acute metabolic encephalopathy: Secondary to hypoxemia and infectious process.  Treating underlying cause. Alert, following command. Improved.   5-Sleep apnea: CPAP  Bipolar disorder continue with home medication.  History of intentional opioid overdose.  Patient reported to the nurse that she had an online account where she came buy tramadol.  She will take 10 tramadol over  the course of days for pain.\ Social worker consulted for drug counseling  LE edema; will give one dose lasix.  HTN; oral dose of lasix today   Estimated body mass index is 32.89 kg/m as calculated from the following:   Height as of this encounter: 5\' 7"  (1.702 m).   Weight as of this encounter: 95.3 kg.   DVT prophylaxis: Lovenox Code Status: Full code Family Communication: Care discussed with patient Disposition Plan:  Patient is from: Home Anticipated d/c date: 2 to 3 days Barriers to d/c or necessity for inpatient status: Remain inpatient for treatment of pneumonia, concern for aspiration and abnormal esophagus on CT scan  Consultants:   GI  Procedures:   None  Antimicrobials:  Unasyn  Subjective: Report she takes 10 tramadol at ones, she denies intention to harm herself. . I have advised not to do that, it can be life threatening.  She will benefit from been taper off tramadol.   Breathing better. Cough improved Objective: Vitals:   05/14/19 2123 05/15/19 0603 05/15/19 0842 05/15/19 1231  BP: (!) 150/95 (!) 143/69 123/73 (!) 174/92  Pulse: 92 (!) 116 (!) 103 87  Resp: 18 20 16  (!) 22  Temp: 98.5 F (36.9 C) 98.7 F (37.1 C) 98.4 F (36.9 C) 98.2 F (36.8 C)  TempSrc: Oral Oral Oral Oral  SpO2: 100% 94% 93% 100%  Weight:      Height:        Intake/Output Summary (Last 24 hours) at 05/15/2019 1410 Last data filed at 05/15/2019 1350 Gross per 24 hour  Intake 2028.19 ml  Output 3300 ml  Net -1271.81 ml   Filed Weights   05/13/19 1123  Weight: 95.3 kg    Examination:  General exam; NAD Respiratory system: Less ronchus Cardiovascular system: S 1, S 2 RRR Gastrointestinal system: BS present, soft, nt Central nervous system: Alert, and oriented Extremities: Symmetric power.  Skin: No rashes  Data Reviewed: I have personally reviewed following labs and imaging studies  CBC: Recent Labs  Lab 05/13/19 1146 05/14/19 0311 05/15/19 0439  WBC  11.7* 12.8* 7.4  NEUTROABS 10.4*  --   --   HGB 12.0 11.9* 11.1*  HCT 38.3 37.8 34.6*  MCV 93.6 92.2 92.3  PLT 130* 137* [id number]   Basic Metabolic Panel: Recent Labs  Lab 05/13/19 1146 05/14/19 0311 05/15/19 0439  NA 139 142 143  K 4.1 3.9 3.2*  CL 107 108 112*  CO2 23 25 24   GLUCOSE 123* 134* 105*  BUN 13 7 7   CREATININE 1.08* 0.65 0.77  CALCIUM 9.1 8.9 8.5*   GFR: Estimated Creatinine Clearance: 96.4 mL/min (by C-G formula based on SCr of 0.77 mg/dL). Liver Function Tests: Recent Labs  Lab 05/13/19 1146  AST 57*  ALT 84*  ALKPHOS 64  BILITOT 0.3  PROT 6.4*  ALBUMIN 3.7   Recent Labs  Lab 05/13/19 1146  LIPASE 37   No results for input(s): AMMONIA in the last 168 hours. Coagulation Profile: No results for input(s): INR, PROTIME in the last 168 hours. Cardiac Enzymes: No results for input(s): CKTOTAL, CKMB, CKMBINDEX, TROPONINI in the last 168 hours. BNP (last 3 results) No results for input(s): PROBNP in the last 8760 hours. HbA1C: No results for input(s): HGBA1C in the last 72 hours. CBG: Recent Labs  Lab 05/13/19 2021 05/13/19 2347 05/14/19 0351 05/15/19 0622  GLUCAP 127* 120* 122* 94   Lipid Profile: No results for input(s): CHOL, HDL, LDLCALC, TRIG, CHOLHDL, LDLDIRECT in the last 72 hours. Thyroid Function Tests: No results for input(s): TSH, T4TOTAL, FREET4, T3FREE, THYROIDAB in the last 72 hours. Anemia Panel: No results for input(s): VITAMINB12, FOLATE, FERRITIN, TIBC, IRON, RETICCTPCT in the last 72 hours. Sepsis Labs: No results for input(s): PROCALCITON, LATICACIDVEN in the last 168 hours.  Recent Results (from the past 240 hour(s))  SARS CORONAVIRUS 2 (TAT 6-24 HRS) Nasopharyngeal Nasopharyngeal Swab     Status: None   Collection Time: 05/13/19  4:00 PM   Specimen: Nasopharyngeal Swab  Result Value Ref Range Status   SARS Coronavirus 2 NEGATIVE NEGATIVE Final    Comment: (NOTE) SARS-CoV-2 target nucleic acids are NOT DETECTED. The  SARS-CoV-2 RNA is generally detectable in upper and lower respiratory specimens during the acute phase of infection. Negative results do not preclude SARS-CoV-2 infection, do not rule out co-infections with other pathogens, and should not be used as the sole basis for treatment or other patient management decisions. Negative results must be combined with clinical observations, patient history, and epidemiological information. The expected result is Negative. Fact Sheet for Patients: SugarRoll.be Fact Sheet for Healthcare Providers: https://www.woods-mathews.com/ This test is not yet approved or cleared by the [place] FDA and  has been authorized for detection and/or diagnosis of SARS-CoV-2 by FDA under an Emergency Use Authorization (EUA). This EUA will remain  in effect (meaning this test can be used) for the duration of the COVID-19 declaration under Section 56 4(b)(1) of the Act, 21 U.S.C. section 360bbb-3(b)(1), unless the authorization is terminated or revoked sooner. Performed at [facility], [place] [address] [place], [place] [address]          Radiology Studies: CT Angio Chest  PE W and/or Wo Contrast  Result Date: 05/13/2019 CLINICAL DATA:  Shortness of breath with decreased oxygen saturation and positive D-dimer EXAM: CT ANGIOGRAPHY CHEST WITH CONTRAST TECHNIQUE: Multidetector CT imaging of the chest was performed using the standard protocol during bolus administration of intravenous contrast. Multiplanar CT image reconstructions and MIPs were obtained to evaluate the vascular anatomy. CONTRAST:  195mL OMNIPAQUE IOHEXOL 350 MG/ML SOLN COMPARISON:  Chest radiograph May 13, 2019 and CT angiogram chest Jul 24, 2010 FINDINGS: Cardiovascular: There is no demonstrable pulmonary embolus. There is no thoracic aortic aneurysm or dissection. The visualized great vessels appear normal. There are occasional foci of coronary artery  calcification. There is no pericardial effusion or pericardial thickening. Mediastinum/Nodes: The right lobe of the thyroid is larger than the left lobe, a stable presumed anatomic variant. No focal thyroid lesions are evident. There is a lymph node to the left of the carina measuring 1.8 x 1.3 cm. No other lymph node prominence evident. There is soft tissue thickening in the upper thoracic esophagus with mild anterior impression on the trachea at this level. Lungs/Pleura: There is focal airspace opacity in portions of the posterior and superior segments of the left lower lobe consistent with pneumonia. There is scarring in the extreme right apex. There are scattered areas of apparent mosaic attenuation, particularly in the right lower lobe and posterior segment of right upper lobe. No pleural effusions. Upper Abdomen: Gallbladder absent. Visualized upper abdominal structures otherwise appear unremarkable. Musculoskeletal: There are foci of degenerative change in the thoracic spine. No blastic or lytic bone lesions. No chest wall lesions. Review of the MIP images confirms the above findings. IMPRESSION: 1. No demonstrable pulmonary embolus. No thoracic aortic aneurysm or dissection. There are occasional foci of coronary artery calcification. 2. Airspace opacity in portions of the left lower lobe consistent with pneumonia. Areas of apparent mosaic attenuation likely represent foci of small airways obstructive disease. There is also mild patchy atelectasis. 3. Enlarged left pericarinal lymph node of uncertain etiology. No other lymph node prominence. 4. Apparent soft tissue fullness in the upper thoracic esophagus approximately 5 cm. Etiology for this soft tissue fullness uncertain. This finding may warrant direct visualization or contrast esophagram to further evaluate. 5.  Gallbladder absent. Electronically Signed   By: Lowella Grip III M.D.   On: 05/13/2019 15:09        Scheduled Meds: . enoxaparin  (LOVENOX) injection  40 mg Subcutaneous QHS  . feeding supplement (ENSURE ENLIVE)  237 mL Oral BID BM  . guaiFENesin  600 mg Oral BID  . lithium carbonate  600 mg Oral QHS  . pantoprazole (PROTONIX) IV  40 mg Intravenous Q12H  . QUEtiapine  400 mg Oral QHS  . traZODone  200 mg Oral QHS   Continuous Infusions: . ampicillin-sulbactam (UNASYN) IV 3 g (05/15/19 1309)     LOS: 2 days    Time spent: 35 minutes.     Elmarie Shiley, MD [facility]   If 7PM-7AM, please contact night-coverage [email or web address]  05/15/2019, 2:10 PM

## 2019-05-15 NOTE — Progress Notes (Signed)
Patient ID: [name], female   DOB: 18-Feb-1966, 54 y.o.   MRN: [id number]    Progress Note   Subjective  Day # 2 CC; aspiration pneumonia, abnormal CT of esophagus  WBC 7.4/hemoglobin 11.1 K+ 3.2  Bedside swallow eval-no overt aspiration or pharyngeal dysfunction obvious  Patient says she feels about the same, will coughing a lot and feels short of breath though no longer requiring oxygen.  She has been able to swallow full liquids without difficulty    Objective   Vital signs in last 24 hours: Temp:  [98.3 F (36.8 C)-98.7 F (37.1 C)] 98.7 F (37.1 C) (02/28 0603) Pulse Rate:  [92-116] 116 (02/28 0603) Resp:  [16-20] 20 (02/28 0603) BP: (124-150)/(69-95) 143/69 (02/28 0603) SpO2:  [94 %-100 %] 94 % (02/28 0603) Last BM Date: (pt unable to recall) General: Older white female in NAD Heart:  Regular rate and rhythm; no murmurs Lungs: Scattered rhonchi, decreased breath sounds left base Abdomen:  Soft, nontender and nondistended. Normal bowel sounds. Extremities:  Without edema. Neurologic:  Alert and oriented,  grossly normal neurologically. Psych:  Cooperative. Normal mood and affect.  Intake/Output from previous day: 02/27 0701 - 02/28 0700 In: 1968.2 [I.V.:1768.2; IV Piggyback:200] Out: 1500 [Urine:1500] Intake/Output this shift: No intake/output data recorded.  Lab Results: Recent Labs    05/13/19 1146 05/14/19 0311 05/15/19 0439  WBC 11.7* 12.8* 7.4  HGB 12.0 11.9* 11.1*  HCT 38.3 37.8 34.6*  PLT 130* 137* 157   BMET Recent Labs    05/13/19 1146 05/14/19 0311 05/15/19 0439  NA 139 142 143  K 4.1 3.9 3.2*  CL 107 108 112*  CO2 23 25 24   GLUCOSE 123* 134* 105*  BUN 13 7 7   CREATININE 1.08* 0.65 0.77  CALCIUM 9.1 8.9 8.5*   LFT Recent Labs    05/13/19 1146  PROT 6.4*  ALBUMIN 3.7  AST 57*  ALT 84*  ALKPHOS 64  BILITOT 0.3   PT/INR No results for input(s): LABPROT, INR in the last 72 hours.  Studies/Results: DG Chest 2  View  Result Date: 05/13/2019 CLINICAL DATA:  Shortness of breath. EXAM: CHEST - 2 VIEW COMPARISON:  05/29/2018. FINDINGS: Mediastinum and hilar structures normal. Heart size stable given technique. Low lung volumes. Mild right base infiltrate cannot be excluded. No pleural effusion or pneumothorax. IMPRESSION: Low lung volumes.  Mild right base infiltrate cannot be excluded. Electronically Signed   By: Marcello Moores  Register   On: 05/13/2019 11:56   CT Angio Chest PE W and/or Wo Contrast  Result Date: 05/13/2019 CLINICAL DATA:  Shortness of breath with decreased oxygen saturation and positive D-dimer EXAM: CT ANGIOGRAPHY CHEST WITH CONTRAST TECHNIQUE: Multidetector CT imaging of the chest was performed using the standard protocol during bolus administration of intravenous contrast. Multiplanar CT image reconstructions and MIPs were obtained to evaluate the vascular anatomy. CONTRAST:  124mL OMNIPAQUE IOHEXOL 350 MG/ML SOLN COMPARISON:  Chest radiograph May 13, 2019 and CT angiogram chest Jul 24, 2010 FINDINGS: Cardiovascular: There is no demonstrable pulmonary embolus. There is no thoracic aortic aneurysm or dissection. The visualized great vessels appear normal. There are occasional foci of coronary artery calcification. There is no pericardial effusion or pericardial thickening. Mediastinum/Nodes: The right lobe of the thyroid is larger than the left lobe, a stable presumed anatomic variant. No focal thyroid lesions are evident. There is a lymph node to the left of the carina measuring 1.8 x 1.3 cm. No other lymph node prominence evident. There is  soft tissue thickening in the upper thoracic esophagus with mild anterior impression on the trachea at this level. Lungs/Pleura: There is focal airspace opacity in portions of the posterior and superior segments of the left lower lobe consistent with pneumonia. There is scarring in the extreme right apex. There are scattered areas of apparent mosaic attenuation,  particularly in the right lower lobe and posterior segment of right upper lobe. No pleural effusions. Upper Abdomen: Gallbladder absent. Visualized upper abdominal structures otherwise appear unremarkable. Musculoskeletal: There are foci of degenerative change in the thoracic spine. No blastic or lytic bone lesions. No chest wall lesions. Review of the MIP images confirms the above findings. IMPRESSION: 1. No demonstrable pulmonary embolus. No thoracic aortic aneurysm or dissection. There are occasional foci of coronary artery calcification. 2. Airspace opacity in portions of the left lower lobe consistent with pneumonia. Areas of apparent mosaic attenuation likely represent foci of small airways obstructive disease. There is also mild patchy atelectasis. 3. Enlarged left pericarinal lymph node of uncertain etiology. No other lymph node prominence. 4. Apparent soft tissue fullness in the upper thoracic esophagus approximately 5 cm. Etiology for this soft tissue fullness uncertain. This finding may warrant direct visualization or contrast esophagram to further evaluate. 5.  Gallbladder absent. Electronically Signed   By: Lowella Grip III M.D.   On: 05/13/2019 15:09       Assessment / Plan:    #45 54 year old white female admitted with aspiration pneumonia, left lower lobe pneumonia on CT  #2 abnormal CT of esophagus with 5 cm segment of soft tissue fullness in the proximal esophagus and a 1.8 x 1.3 cm lymph node  left carina.  Patient had been having intermittent patient had been having intermittent patient had been having intermittent Pt had been having  intermittent "choking" at side swallow eval yesterday  " choking" Requiring  regurgitation  Recently  Bedside swallow eval negative  #2 Bipolar disorder #3 chronic pain syndrome #4 family hx Colon CA - up to date with Colonoscopy  Plan; continue full liquids We will add Ensure between meals Continue twice daily IV PPI GI will plan for EGD  the next couple of days Would like to see her have continued improvement from pulmonary standpoint prior to sedation.  Lovenox will need to be held pre-EGD.    Active Problems:   Schizoaffective disorder, bipolar type (HCC)   Aspiration pneumonia (HCC)   PNA (pneumonia)   Community acquired pneumonia of left lower lobe of lung   Esophageal thickening   Abnormal finding on GI tract imaging     LOS: 2 days   Mame Twombly EsterwoodPA-C  05/15/2019, 8:37 AM

## 2019-05-16 ENCOUNTER — Other Ambulatory Visit: Payer: Self-pay | Admitting: Physician Assistant

## 2019-05-16 LAB — CBC
HCT: 35.3 % — ABNORMAL LOW (ref 36.0–46.0)
Hemoglobin: 11.9 g/dL — ABNORMAL LOW (ref 12.0–15.0)
MCH: 29.3 pg (ref 26.0–34.0)
MCHC: 33.7 g/dL (ref 30.0–36.0)
MCV: 86.9 fL (ref 80.0–100.0)
Platelets: 163 10*3/uL (ref 150–400)
RBC: 4.06 MIL/uL (ref 3.87–5.11)
RDW: 12.4 % (ref 11.5–15.5)
WBC: 7.3 10*3/uL (ref 4.0–10.5)
nRBC: 0 % (ref 0.0–0.2)

## 2019-05-16 LAB — BASIC METABOLIC PANEL
Anion gap: 11 (ref 5–15)
BUN: 9 mg/dL (ref 6–20)
CO2: 23 mmol/L (ref 22–32)
Calcium: 9.1 mg/dL (ref 8.9–10.3)
Chloride: 108 mmol/L (ref 98–111)
Creatinine, Ser: 0.81 mg/dL (ref 0.44–1.00)
GFR calc Af Amer: >60 mL/min (ref >60)
GFR calc non Af Amer: >60 mL/min (ref >60)
Glucose, Bld: 83 mg/dL (ref 70–99)
Potassium: 3.3 mmol/L — ABNORMAL LOW (ref 3.5–5.1)
Sodium: 142 mmol/L (ref 135–145)

## 2019-05-16 LAB — MAGNESIUM: Magnesium: 1.9 mg/dL (ref 1.7–2.4)

## 2019-05-16 MED ORDER — POTASSIUM CHLORIDE CRYS ER 20 MEQ PO TBCR
40.0000 meq | EXTENDED_RELEASE_TABLET | Freq: Once | ORAL | Status: AC
  Administered 2019-05-16: 40 meq via ORAL
  Filled 2019-05-16: qty 2

## 2019-05-16 MED ORDER — FUROSEMIDE 20 MG PO TABS
20.0000 mg | ORAL_TABLET | Freq: Once | ORAL | Status: AC
  Administered 2019-05-16: 20 mg via ORAL
  Filled 2019-05-16: qty 1

## 2019-05-16 MED ORDER — ENOXAPARIN SODIUM 40 MG/0.4ML ~~LOC~~ SOLN: 40.0000 mg | Freq: Every day | SUBCUTANEOUS | Status: DC

## 2019-05-16 MED ORDER — SODIUM CHLORIDE 0.9 % IV SOLN: INTRAVENOUS | Status: DC

## 2019-05-16 NOTE — Progress Notes (Addendum)
PROGRESS NOTE    [name]  [id number] DOB: [birth year] DOA: 05/13/2019 PCP: Brunetta Jeans, PA-C   Brief Narrative: 54 year old with past medical history significant for anxiety, bipolar, migraine, OSA, chronic pain syndrome who presented with nausea and vomiting and cough and shortness of breath.  The symptoms started the morning of admission.  She vomited 5-6 times nonbilious emesis.  She also started to have a nonproductive cough and shortness of breath after.  Patient in the ED CT angio negative for PE left lower lobe pneumonia and thickening of the upper esophagus.   Assessment & Plan:   Active Problems:   Schizoaffective disorder, bipolar type (HCC)   Aspiration pneumonia (HCC)   PNA (pneumonia)   Community acquired pneumonia of left lower lobe of lung   Esophageal thickening   Abnormal finding on GI tract imaging  1-Acute hypoxic respiratory failure, secondary to pneumonia probably aspiration pneumonia: -Continue with Unasyn, day 4. -Start scheduled nebulizer treatment. Now on room air. Improving.   2-Aspiration pneumonia; Suspect related to vomiting, also CT show abnormal tissue upper esophagus. No significant evidence of pharyngeal dysphagia.  Plan to proceed with endoscopy tomorrow.   3-Soft tissue swelling in the upper esophagus: GI consulted. Esophagogram was initially ordered, after discussed case with radiologist.  We will proceed first with a speech and swallow evaluation prior to an esophagogram.  We could also consider doing endoscopy first. Plan to proceed with endoscopy tomorrow.   4-Acute metabolic encephalopathy: Secondary to hypoxemia and infectious process.  Treating underlying cause. Alert, following command. Improved.   5-Sleep apnea: CPAP  Bipolar disorder continue with home medication.  History of intentional opioid overdose.  Patient reported to the nurse that she had an online account where she can buy tramadol.  She  take 10  tramadol at once. Counseling provided to patient. She denies intentional overdose, denies intention to harm herself.  Husband today at bedside, explain to him that he will need to manage patient pain medications. And patient will benefit from tapering this medication.  I inform conern to PCP. Patient follows with pain clinic. I was not able to reach pain clinic office, phone number is not working.  I will ask Psych to evaluate.  Social worker consulted for drug counseling  LE edema; will give another dose of lasix.  HTN; will get one dose of lasix   Estimated body mass index is 32.89 kg/m as calculated from the following:   Height as of this encounter: 5\' 7"  (1.702 m).   Weight as of this encounter: 95.3 kg.   DVT prophylaxis: Lovenox Code Status: Full code Family Communication: Care discussed with patient Disposition Plan:  Patient is from: Home Anticipated d/c date: 2 to 3 days Barriers to d/c or necessity for inpatient status: Remain inpatient for treatment of pneumonia, concern for aspiration and abnormal esophagus on CT scan  Consultants:   GI  Procedures:   None  Antimicrobials:  Unasyn  Subjective: Feeling well, cough improved. Dyspnea improved. Not eating a lot.    Objective: Vitals:   05/15/19 1551 05/15/19 2040 05/16/19 0544 05/16/19 1342  BP: (!) 145/85 (!) 150/85 140/85 (!) 151/93  Pulse: 89 88 (!) 107 87  Resp: 14 18 18 14   Temp:  98.8 F (37.1 C) 99.5 F (37.5 C) 99.1 F (37.3 C)  TempSrc:  Oral Oral Oral  SpO2:  99% 94% 96%  Weight:      Height:        Intake/Output Summary (Last 24  hours) at 05/16/2019 1506 Last data filed at 05/16/2019 0600 Gross per 24 hour  Intake 866.23 ml  Output 2027 ml  Net -1160.77 ml   Filed Weights   05/13/19 1123  Weight: 95.3 kg    Examination:  General exam; NAD Respiratory system: CTA Cardiovascular system: S 1, S 2 RRR Gastrointestinal system: BS , Present, soft, nt Central nervous system: Alert,  follows command Extremities: Symmetric power.  Skin: no rashes.   Data Reviewed: I have personally reviewed following labs and imaging studies  CBC: Recent Labs  Lab 05/13/19 1146 05/14/19 0311 05/15/19 0439 05/16/19 0359  WBC 11.7* 12.8* 7.4 7.3  NEUTROABS 10.4*  --   --   --   HGB 12.0 11.9* 11.1* 11.9*  HCT 38.3 37.8 34.6* 35.3*  MCV 93.6 92.2 92.3 86.9  PLT 130* 137* 157 [id number]   Basic Metabolic Panel: Recent Labs  Lab 05/13/19 1146 05/14/19 0311 05/15/19 0439 05/16/19 0351  NA 139 142 143 142  K 4.1 3.9 3.2* 3.3*  CL 107 108 112* 108  CO2 23 25 24 23   GLUCOSE 123* 134* 105* 83  BUN 13 7 7 9   CREATININE 1.08* 0.65 0.77 0.81  CALCIUM 9.1 8.9 8.5* 9.1  MG  --   --   --  1.9   GFR: Estimated Creatinine Clearance: 95.2 mL/min (by C-G formula based on SCr of 0.81 mg/dL). Liver Function Tests: Recent Labs  Lab 05/13/19 1146  AST 57*  ALT 84*  ALKPHOS 64  BILITOT 0.3  PROT 6.4*  ALBUMIN 3.7   Recent Labs  Lab 05/13/19 1146  LIPASE 37   No results for input(s): AMMONIA in the last 168 hours. Coagulation Profile: No results for input(s): INR, PROTIME in the last 168 hours. Cardiac Enzymes: No results for input(s): CKTOTAL, CKMB, CKMBINDEX, TROPONINI in the last 168 hours. BNP (last 3 results) No results for input(s): PROBNP in the last 8760 hours. HbA1C: No results for input(s): HGBA1C in the last 72 hours. CBG: Recent Labs  Lab 05/13/19 2021 05/13/19 2347 05/14/19 0351 05/15/19 0622  GLUCAP 127* 120* 122* 94   Lipid Profile: No results for input(s): CHOL, HDL, LDLCALC, TRIG, CHOLHDL, LDLDIRECT in the last 72 hours. Thyroid Function Tests: No results for input(s): TSH, T4TOTAL, FREET4, T3FREE, THYROIDAB in the last 72 hours. Anemia Panel: No results for input(s): VITAMINB12, FOLATE, FERRITIN, TIBC, IRON, RETICCTPCT in the last 72 hours. Sepsis Labs: No results for input(s): PROCALCITON, LATICACIDVEN in the last 168 hours.  Recent Results (from  the past 240 hour(s))  SARS CORONAVIRUS 2 (TAT 6-24 HRS) Nasopharyngeal Nasopharyngeal Swab     Status: None   Collection Time: 05/13/19  4:00 PM   Specimen: Nasopharyngeal Swab  Result Value Ref Range Status   SARS Coronavirus 2 NEGATIVE NEGATIVE Final    Comment: (NOTE) SARS-CoV-2 target nucleic acids are NOT DETECTED. The SARS-CoV-2 RNA is generally detectable in upper and lower respiratory specimens during the acute phase of infection. Negative results do not preclude SARS-CoV-2 infection, do not rule out co-infections with other pathogens, and should not be used as the sole basis for treatment or other patient management decisions. Negative results must be combined with clinical observations, patient history, and epidemiological information. The expected result is Negative. Fact Sheet for Patients: SugarRoll.be Fact Sheet for Healthcare Providers: https://www.woods-mathews.com/ This test is not yet approved or cleared by the [place] FDA and  has been authorized for detection and/or diagnosis of SARS-CoV-2 by FDA under an Emergency Use  Authorization (EUA). This EUA will remain  in effect (meaning this test can be used) for the duration of the COVID-19 declaration under Section 56 4(b)(1) of the Act, 21 U.S.C. section 360bbb-3(b)(1), unless the authorization is terminated or revoked sooner. Performed at [facility], [place] [address] [place], [place] [address]          Radiology Studies: No results found.      Scheduled Meds: . feeding supplement (ENSURE ENLIVE)  237 mL Oral BID BM  . guaiFENesin  600 mg Oral BID  . lithium carbonate  600 mg Oral QHS  . pantoprazole (PROTONIX) IV  40 mg Intravenous Q12H  . QUEtiapine  400 mg Oral QHS  . traZODone  200 mg Oral QHS   Continuous Infusions: . sodium chloride    . ampicillin-sulbactam (UNASYN) IV 3 g (05/16/19 1047)     LOS: 3 days    Time spent: 35 minutes.      Elmarie Shiley, MD [facility]   If 7PM-7AM, please contact night-coverage [email or web address]  05/16/2019, 3:06 PM

## 2019-05-16 NOTE — Progress Notes (Addendum)
    Progress Note   Subjective  Day #3 Chief Complaint: Aspiration pneumonia, abnormal CT of the esophagus  This morning, the patient says she is feeling well, has not required oxygen since Saturday, 2 days ago, she is ready for an EGD.  She is tolerating all of her clear liquids and some applesauce with meds.  Denies any new complaints or concerns.  (Of note patient has 5 children ranging from 21-30)   Objective   Vital signs in last 24 hours: Temp:  [98.2 F (36.8 C)-99.5 F (37.5 C)] 99.5 F (37.5 C) (03/01 0544) Pulse Rate:  [87-107] 107 (03/01 0544) Resp:  [14-22] 18 (03/01 0544) BP: (140-174)/(85-92) 140/85 (03/01 0544) SpO2:  [94 %-100 %] 94 % (03/01 0544) Last BM Date: (pt states "it's been several days." Refused intervention) General:    overweightwhite female in NAD Heart:  Regular rate and rhythm; no murmurs Lungs: Respirations even and unlabored, +scattered rhonchi Abdomen:  Soft, nontender and nondistended. Normal bowel sounds. Extremities:  Without edema. Neurologic:  Alert and oriented,  grossly normal neurologically. Psych:  Cooperative. Normal mood and affect.  Intake/Output from previous day: 02/28 0701 - 03/01 0700 In: 926.2 [P.O.:60; IV Piggyback:866.2] Out: 5127 [Urine:5126; Stool:1]  Lab Results: Recent Labs    05/14/19 0311 05/15/19 0439 05/16/19 0359  WBC 12.8* 7.4 7.3  HGB 11.9* 11.1* 11.9*  HCT 37.8 34.6* 35.3*  PLT 137* 157 163   BMET Recent Labs    05/14/19 0311 05/15/19 0439 05/16/19 0351  NA 142 143 142  K 3.9 3.2* 3.3*  CL 108 112* 108  CO2 25 24 23   GLUCOSE 134* 105* 83  BUN 7 7 9   CREATININE 0.65 0.77 0.81  CALCIUM 8.9 8.5* 9.1   LFT Recent Labs    05/13/19 1146  PROT 6.4*  ALBUMIN 3.7  AST 57*  ALT 84*  ALKPHOS 64  BILITOT 0.3     Assessment / Plan:   Assessment: 1.  Aspiration pneumonia/left lower lobe pneumonia on CT 2.  Abnormal CT of the esophagus: With a 5 cm segment of soft tissue fullness in the  proximal esophagus and a 1.8 x 1.3 cm lymph node in the left carina, describes intermittent "choking", requiring regurgitation, bedside swallow eval negative yesterday 3.  Bipolar disorder 4.  Chronic pain syndrome 5.  Family history of colon cancer: Up-to-date with colonoscopy  Plan: 1.  Continue twice daily IV PPI 2.  We will plan for EGD tomorrow 05/17/2019.  Did review risks, benefits, limitations and alternatives and the patient agrees to proceed. 3.  Patient can remain on current diet and will be n.p.o. after midnight 4.  Lovenox will need to be held pre-EGD 5.  Please await further recommendations from Dr. Fuller Plan later today  Thank you for kind consultation, we will continue to follow   LOS: 3 days   Levin Erp  05/16/2019, 10:18 AM     Attending Physician Note   I have taken an interval history, reviewed the chart and examined the patient. I agree with the Advanced Practitioner's note, impression and recommendations.   EGD tomorrow to evaluate ? dysphagia and abnormal esophagus on chest CT: soft tissue fullness in upper esophagus, measuring about 5 cm, with anterior impression on the trachea.   Lucio Edward, MD [facility]

## 2019-05-16 NOTE — Anesthesia Preprocedure Evaluation (Addendum)
Anesthesia Evaluation  Patient identified by MRN, date of birth, ID band Patient awake    Reviewed: Allergy & Precautions, NPO status , Patient's Chart, lab work & pertinent test results  History of Anesthesia Complications (+) PONV  Airway Mallampati: II  TM Distance: >3 FB Neck ROM: Full    Dental no notable dental hx. (+) Teeth Intact, Dental Advisory Given   Pulmonary sleep apnea ,    Pulmonary exam normal breath sounds clear to auscultation       Cardiovascular negative cardio ROS Normal cardiovascular exam Rhythm:Regular Rate:Normal     Neuro/Psych  Headaches, negative psych ROS   GI/Hepatic Neg liver ROS, GERD  Medicated,  Endo/Other  negative endocrine ROS  Renal/GU Renal diseaseK+ 3.3 Cr 0.81     Musculoskeletal negative musculoskeletal ROS (+)   Abdominal   Peds  Hematology Hgb 11.9   Anesthesia Other Findings   Reproductive/Obstetrics                            Anesthesia Physical Anesthesia Plan  ASA: II  Anesthesia Plan: MAC   Post-op Pain Management:    Induction: Intravenous  PONV Risk Score and Plan: Treatment may vary due to age or medical condition  Airway Management Planned: Nasal Cannula and Natural Airway  Additional Equipment: None  Intra-op Plan:   Post-operative Plan:   Informed Consent: I have reviewed the patients History and Physical, chart, labs and discussed the procedure including the risks, benefits and alternatives for the proposed anesthesia with the patient or authorized representative who has indicated his/her understanding and acceptance.     Dental advisory given  Plan Discussed with: CRNA  Anesthesia Plan Comments: (EGD for CT abdominal pain)       Anesthesia Quick Evaluation

## 2019-05-16 NOTE — H&P (View-Only) (Signed)
    Progress Note   Subjective  Day #3 Chief Complaint: Aspiration pneumonia, abnormal CT of the esophagus  This morning, the patient says she is feeling well, has not required oxygen since Saturday, 2 days ago, she is ready for an EGD.  She is tolerating all of her clear liquids and some applesauce with meds.  Denies any new complaints or concerns.  (Of note patient has 5 children ranging from 21-30)   Objective   Vital signs in last 24 hours: Temp:  [98.2 F (36.8 C)-99.5 F (37.5 C)] 99.5 F (37.5 C) (03/01 0544) Pulse Rate:  [87-107] 107 (03/01 0544) Resp:  [14-22] 18 (03/01 0544) BP: (140-174)/(85-92) 140/85 (03/01 0544) SpO2:  [94 %-100 %] 94 % (03/01 0544) Last BM Date: (pt states "it's been several days." Refused intervention) General:    overweightwhite female in NAD Heart:  Regular rate and rhythm; no murmurs Lungs: Respirations even and unlabored, +scattered rhonchi Abdomen:  Soft, nontender and nondistended. Normal bowel sounds. Extremities:  Without edema. Neurologic:  Alert and oriented,  grossly normal neurologically. Psych:  Cooperative. Normal mood and affect.  Intake/Output from previous day: 02/28 0701 - 03/01 0700 In: 926.2 [P.O.:60; IV Piggyback:866.2] Out: 5127 [Urine:5126; Stool:1]  Lab Results: Recent Labs    05/14/19 0311 05/15/19 0439 05/16/19 0359  WBC 12.8* 7.4 7.3  HGB 11.9* 11.1* 11.9*  HCT 37.8 34.6* 35.3*  PLT 137* 157 163   BMET Recent Labs    05/14/19 0311 05/15/19 0439 05/16/19 0351  NA 142 143 142  K 3.9 3.2* 3.3*  CL 108 112* 108  CO2 25 24 23   GLUCOSE 134* 105* 83  BUN 7 7 9   CREATININE 0.65 0.77 0.81  CALCIUM 8.9 8.5* 9.1   LFT Recent Labs    05/13/19 1146  PROT 6.4*  ALBUMIN 3.7  AST 57*  ALT 84*  ALKPHOS 64  BILITOT 0.3     Assessment / Plan:   Assessment: 1.  Aspiration pneumonia/left lower lobe pneumonia on CT 2.  Abnormal CT of the esophagus: With a 5 cm segment of soft tissue fullness in the  proximal esophagus and a 1.8 x 1.3 cm lymph node in the left carina, describes intermittent "choking", requiring regurgitation, bedside swallow eval negative yesterday 3.  Bipolar disorder 4.  Chronic pain syndrome 5.  Family history of colon cancer: Up-to-date with colonoscopy  Plan: 1.  Continue twice daily IV PPI 2.  We will plan for EGD tomorrow 05/17/2019.  Did review risks, benefits, limitations and alternatives and the patient agrees to proceed. 3.  Patient can remain on current diet and will be n.p.o. after midnight 4.  Lovenox will need to be held pre-EGD 5.  Please await further recommendations from Dr. Fuller Plan later today  Thank you for kind consultation, we will continue to follow   LOS: 3 days   Levin Erp  05/16/2019, 10:18 AM     Attending Physician Note   I have taken an interval history, reviewed the chart and examined the patient. I agree with the Advanced Practitioner's note, impression and recommendations.   EGD tomorrow to evaluate ? dysphagia and abnormal esophagus on chest CT: soft tissue fullness in upper esophagus, measuring about 5 cm, with anterior impression on the trachea.   Lucio Edward, MD [facility]

## 2019-05-16 NOTE — Progress Notes (Signed)
Pt displayed flat affect, and did not want to participate in patient care. Nauseous throughtout the day. Medicated for nausea upon request. Generalized edema noted in arms. Will cont to monitor. SRP, RN

## 2019-05-16 NOTE — Care Management Important Message (Signed)
Important Message  Patient Details IM Letter given to Nancy Marus RN Case Manager to present to the Patient Name: [name] MRN: [id number] Date of Birth: Apr 13, 1965   Medicare Important Message Given:  Yes     Kerin Salen 05/16/2019, 11:30 AM

## 2019-05-16 NOTE — Plan of Care (Signed)
  Problem: Clinical Measurements: Goal: Diagnostic test results will improve Outcome: Progressing   

## 2019-05-17 ENCOUNTER — Encounter (HOSPITAL_COMMUNITY): Payer: Self-pay | Admitting: Internal Medicine

## 2019-05-17 ENCOUNTER — Encounter (HOSPITAL_COMMUNITY)
Admission: EM | Disposition: A | Discharge status: Home / Self Care | Payer: Self-pay | Source: Home / Self Care | Attending: Internal Medicine

## 2019-05-17 ENCOUNTER — Inpatient Hospital Stay (HOSPITAL_COMMUNITY): Payer: Medicare Other | Admitting: Anesthesiology

## 2019-05-17 DIAGNOSIS — K222 Esophageal obstruction: Secondary | ICD-10-CM

## 2019-05-17 DIAGNOSIS — K2970 Gastritis, unspecified, without bleeding: Secondary | ICD-10-CM

## 2019-05-17 HISTORY — PX: BIOPSY: SHX5522

## 2019-05-17 HISTORY — PX: ESOPHAGOGASTRODUODENOSCOPY (EGD) WITH PROPOFOL: SHX5813

## 2019-05-17 LAB — BASIC METABOLIC PANEL
Anion gap: 11 (ref 5–15)
BUN: 9 mg/dL (ref 6–20)
CO2: 22 mmol/L (ref 22–32)
Calcium: 9.2 mg/dL (ref 8.9–10.3)
Chloride: 108 mmol/L (ref 98–111)
Creatinine, Ser: 0.84 mg/dL (ref 0.44–1.00)
GFR calc Af Amer: >60 mL/min (ref >60)
GFR calc non Af Amer: >60 mL/min (ref >60)
Glucose, Bld: 98 mg/dL (ref 70–99)
Potassium: 3.7 mmol/L (ref 3.5–5.1)
Sodium: 141 mmol/L (ref 135–145)

## 2019-05-17 SURGERY — ESOPHAGOGASTRODUODENOSCOPY (EGD) WITH PROPOFOL
Anesthesia: Monitor Anesthesia Care

## 2019-05-17 MED ORDER — PROPOFOL 500 MG/50ML IV EMUL
INTRAVENOUS | Status: DC | PRN
  Administered 2019-05-17: 150 ug/kg/min via INTRAVENOUS

## 2019-05-17 MED ORDER — PROPOFOL 500 MG/50ML IV EMUL
INTRAVENOUS | Status: AC
  Filled 2019-05-17: qty 50

## 2019-05-17 MED ORDER — PANTOPRAZOLE SODIUM 40 MG PO TBEC
40.0000 mg | DELAYED_RELEASE_TABLET | Freq: Every day | ORAL | Status: DC
  Administered 2019-05-18: 40 mg via ORAL
  Filled 2019-05-17: qty 1

## 2019-05-17 MED ORDER — PROPOFOL 10 MG/ML IV BOLUS
INTRAVENOUS | Status: DC | PRN
  Administered 2019-05-17: 30 mg via INTRAVENOUS
  Administered 2019-05-17: 20 mg via INTRAVENOUS

## 2019-05-17 MED ORDER — ONDANSETRON HCL 4 MG/2ML IJ SOLN: 4.0000 mg | Freq: Four times a day (QID) | INTRAMUSCULAR | Status: DC | PRN

## 2019-05-17 MED ORDER — LIDOCAINE 2% (20 MG/ML) 5 ML SYRINGE
INTRAMUSCULAR | Status: DC | PRN
  Administered 2019-05-17: 80 mg via INTRAVENOUS

## 2019-05-17 SURGICAL SUPPLY — 15 items

## 2019-05-17 NOTE — Interval H&P Note (Signed)
History and Physical Interval Note:  05/17/2019 11:40 AM  [name]  has presented today for surgery, with the diagnosis of Abnormal CT.  The various methods of treatment have been discussed with the patient and family. After consideration of risks, benefits and other options for treatment, the patient has consented to  Procedure(s): ESOPHAGOGASTRODUODENOSCOPY (EGD) WITH PROPOFOL (N/A) as a surgical intervention.  The patient's history has been reviewed, patient examined, no change in status, stable for surgery.  I have reviewed the patient's chart and labs.  Questions were answered to the patient's satisfaction.     Pricilla Riffle. Fuller Plan

## 2019-05-17 NOTE — Consult Note (Signed)
[place] Psychiatry Consult   Reason for Consult: "evaluate for overdose, intentional ? Opioids" Referring Physician:  Dr Tyrell Antonio Patient Identification: [name] MRN:  [id number] Principal Diagnosis: <principal problem not specified> Diagnosis:  Active Problems:   Schizoaffective disorder, bipolar type ([place])   Aspiration pneumonia ([place])   PNA (pneumonia)   Community acquired pneumonia of left lower lobe of lung   Esophageal thickening   Abnormal CT scan, esophagus   Total Time spent with patient: 30 minutes  Subjective:   [name] is a 54 y.o. female patient.  Patient assessed by nurse practitioner.  Patient alert and oriented, answers appropriately.  Patient reports "I am here because I have pneumonia."  Patient reports "on yesterday my back pain in my foot pain were around 8 out of 10 so I took 6 tramadol, I took them not as prescribed."  Patient denies this was a suicide attempt.  Patient denies suicidal and homicidal ideations today.  Patient denies history of suicide attempts.  Patient denies auditory or visual hallucinations.  Patient denies access to weapons. Patient reports she and her husband live with their 2 adult sons.  Patient reports history of bipolar disorder, managed by outpatient psychiatry at [facility] psychiatric.  Patient reports she is stable on home medications including Seroquel, trazodone, lithium, and Ambien.  Patient denies symptoms of mania.  Patient reports sleeping 8 hours per night and her appetite is "fine."  Patient reports history of opioid use disorder, patient states she is "clean since 2018."   Patient gives verbal consent to speak with her husband, [name]. Spoke with patient's husband [name]: Patient's husband denies concerns for patient safety.  Patient's husband states "I believe it was accidental, she has been off of her tramadol for about 3 weeks and then took the same dose that she had been taking before."  Patient's husband  denies any history of self-harm or suicidal ideations by patient.  Patient's husband denies weapons in home.    HPI: Patient admitted with pneumonia.  Past Psychiatric History: Opioid use disorder, bipolar disorder  Risk to Self:  Denies Risk to Others:  Denies Prior Inpatient Therapy:  Yes Prior Outpatient Therapy:  Denies  Past Medical History:  Past Medical History:  Diagnosis Date  . Anxiety   . Bipolar affective disorder, mixed, in partial or remission   . Depression   . Dysrhythmia    Left sided heart diease  . Headache(784.0)   . History of gallstones   . Kidney stones   . Mental disorder   . Migraine   . Pneumonia    Hx: of as a child  . PONV (postoperative nausea and vomiting)   . Renal disorder   . Sleep apnea    sleep test 2014, did not have osa  . Substance abuse (HCC)    Fentanyl, Percocet. last use 04/2011    Past Surgical History:  Procedure Laterality Date  . ABDOMINAL HYSTERECTOMY    . 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 BUNIONECTOMY     11/14/2011  . CHOLECYSTECTOMY    . COLONOSCOPY     2001, 2008.  Father has colon cancer  . FOOT SURGERY     right x 2  . NASAL SEPTOPLASTY W/ TURBINOPLASTY Bilateral 12/04/2014   Procedure: NASAL SEPTOPLASTY WITH BILATERAL TURBINATE REDUCTION;  Surgeon:  Izora Gala, MD;  Location: [facility];  Service: ENT;  Laterality: Bilateral;  . TOTAL ABDOMINAL HYSTERECTOMY W/ BILATERAL SALPINGOOPHORECTOMY    . VAGINAL HYSTERECTOMY     Family History:  Family History  Problem Relation Age of Onset  . Colon cancer Father 35  . Breast cancer Father   . Breast cancer Mother   . Uterine cancer Mother   . Stomach cancer Neg Hx   . Rectal cancer Neg Hx   . Esophageal cancer Neg Hx    Family Psychiatric  History: Maternal grandmother  alcohol use disorder, paternal grandmother alcohol use disorder Social History:  Social History   Substance and Sexual Activity  Alcohol Use No  . Alcohol/week: 0.0 standard drinks     Social History   Substance and Sexual Activity  Drug Use No  . Types: Other-see comments, Fentanyl, Hydrocodone    Social History   Socioeconomic History  . Marital status: Married    Spouse name: Not on file  . Number of children: 6  . Years of education: Not on file  . Highest education level: Not on file  Occupational History  . Occupation: Presenter, broadcasting  Tobacco Use  . Smoking status: Never Smoker  . Smokeless tobacco: Never Used  Substance and Sexual Activity  . Alcohol use: No    Alcohol/week: 0.0 standard drinks  . Drug use: No    Types: Other-see comments, Fentanyl, Hydrocodone  . Sexual activity: Not Currently    Birth control/protection: Surgical  Other Topics Concern  . 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 Difficulty of Paying Living Expenses: Not on file  Food Insecurity:   . Worried About Charity fundraiser in the Last Year: Not on file  . Ran Out of Food in the Last Year: Not on file  Transportation Needs:   . Lack of Transportation (Medical): Not on file  . Lack of Transportation (Non-Medical): Not on file  Physical Activity:   . Days of Exercise per Week: Not on file  . Minutes of Exercise per Session: Not on file  Stress:   . Feeling of Stress : Not on file  Social Connections:   . Frequency of Communication with Friends and Family: Not on file  . Frequency of Social Gatherings with Friends and Family: Not on file  . Attends  Religious Services: Not on file  . Active Member of Clubs or Organizations: Not on file  . Attends Archivist Meetings: Not on file  . Marital Status: Not on file   Additional Social History:    Allergies:  No Known Allergies  Labs:  Results for orders placed or performed during the hospital encounter of 05/13/19 (from the past 48 hour(s))  Basic metabolic panel     Status: Abnormal   Collection Time: 05/16/19  3:51 AM  Result Value Ref Range   Sodium 142 135 - 145 mmol/L   Potassium 3.3 (L) 3.5 - 5.1 mmol/L   Chloride 108 98 - 111 mmol/L   CO2 23 22 -  32 mmol/L   Glucose, Bld 83 70 - 99 mg/dL    Comment: Glucose reference range applies only to samples taken after fasting for at least 8 hours.   BUN 9 6 - 20 mg/dL   Creatinine, Ser 0.81 0.44 - 1.00 mg/dL   Calcium 9.1 8.9 - 10.3 mg/dL   GFR calc non Af Amer >60 >60 mL/min   GFR calc Af Amer >60 >60 mL/min   Anion gap 11 5 - 15    Comment: Performed at [facility], [place] [address] [place], [place] [address]  Magnesium     Status: None   Collection Time: 05/16/19  3:51 AM  Result Value Ref Range   Magnesium 1.9 1.7 - 2.4 mg/dL    Comment: Performed at [facility], [place] [address], [place], [place] [address]  CBC     Status: Abnormal   Collection Time: 05/16/19  3:59 AM  Result Value Ref Range   WBC 7.3 4.0 - 10.5 K/uL   RBC 4.06 3.87 - 5.11 MIL/uL   Hemoglobin 11.9 (L) 12.0 - 15.0 g/dL   HCT 35.3 (L) 36.0 - 46.0 %   MCV 86.9 80.0 - 100.0 fL   MCH 29.3 26.0 - 34.0 pg   MCHC 33.7 30.0 - 36.0 g/dL   RDW 12.4 11.5 - 15.5 %   Platelets 163 150 - 400 K/uL   nRBC 0.0 0.0 - 0.2 %    Comment: Performed at [facility], [place] [address] [place], [place] [id number]  Basic metabolic panel     Status: None   Collection Time: 05/17/19  8:33 AM  Result Value Ref Range   Sodium 141 135 - 145 mmol/L   Potassium 3.7 3.5 - 5.1 mmol/L   Chloride 108 98 - 111 mmol/L   CO2  22 22 - 32 mmol/L   Glucose, Bld 98 70 - 99 mg/dL    Comment: Glucose reference range applies only to samples taken after fasting for at least 8 hours.   BUN 9 6 - 20 mg/dL   Creatinine, Ser 0.84 0.44 - 1.00 mg/dL   Calcium 9.2 8.9 - 10.3 mg/dL   GFR calc non Af Amer >60 >60 mL/min   GFR calc Af Amer >60 >60 mL/min   Anion gap 11 5 - 15    Comment: Performed at [facility], [place] [address], [place], [place] [address]    Current Facility-Administered Medications  Medication Dose Route Frequency Provider Last Rate Last Admin  . acetaminophen (TYLENOL) tablet 650 mg  650 mg Oral Q6H PRN Wynetta Fines T, MD   650 mg at 05/14/19 2150   Or  . acetaminophen (TYLENOL) suppository 650 mg  650 mg Rectal Q6H PRN Wynetta Fines T, MD      . Ampicillin-Sulbactam (UNASYN) 3 g in sodium chloride 0.9 % 100 mL IVPB  3 g Intravenous Q6H Wynetta Fines T, MD 200 mL/hr at 05/17/19 1101 3 g at 05/17/19 1101  . feeding supplement (ENSURE ENLIVE) (ENSURE ENLIVE) liquid 237 mL  237 mL Oral BID BM Esterwood, Amy S, PA-C   237 mL at 05/16/19 1724  . guaiFENesin (MUCINEX) 12 hr tablet 600 mg  600 mg Oral BID Regalado, Belkys A, MD   600 mg at 05/16/19 2227  . ipratropium-albuterol (DUONEB) 0.5-2.5 (3) MG/3ML nebulizer solution 3 mL  3 mL Nebulization Q6H PRN Regalado, Belkys A, MD      . lip balm (CARMEX) ointment  Topical PRN Regalado, Belkys A, MD      . lithium carbonate capsule 600 mg  600 mg Oral QHS Wynetta Fines T, MD   600 mg at 05/16/19 2227  . ondansetron (ZOFRAN) injection 4 mg  4 mg Intravenous Q6H PRN Regalado, Belkys A, MD      . Derrill Memo ON 05/18/2019] pantoprazole (PROTONIX) EC tablet 40 mg  40 mg Oral Q0600 Ladene Artist, MD      . promethazine (PHENERGAN) injection 6.25 mg  6.25 mg Intravenous Q6H PRN Regalado, Belkys A, MD   6.25 mg at 05/17/19 0626  . QUEtiapine (SEROQUEL) tablet 400 mg  400 mg Oral QHS Wynetta Fines T, MD   400 mg at 05/16/19 2227  . traMADol (ULTRAM) tablet 50 mg  50 mg  Oral Q8H PRN Regalado, Belkys A, MD   50 mg at 05/16/19 1031  . traZODone (DESYREL) tablet 200 mg  200 mg Oral QHS Wynetta Fines T, MD   200 mg at 05/16/19 2227  . zolpidem (AMBIEN) tablet 5 mg  5 mg Oral QHS PRN Lequita Halt, MD   5 mg at 05/16/19 2256    Musculoskeletal: Strength & Muscle Tone: within normal limits Gait & Station: normal Patient leans: N/A  Psychiatric Specialty Exam: Physical Exam  Nursing note and vitals reviewed. Constitutional: She is oriented to person, place, and time. She appears well-developed.  HENT:  Head: Normocephalic.  Cardiovascular: Normal rate.  Respiratory: Effort normal.  Neurological: She is alert and oriented to person, place, and time.  Psychiatric: Her speech is normal and behavior is normal. Judgment and thought content normal. Cognition and memory are normal. She exhibits a depressed mood.    Review of Systems  Constitutional: Negative.   HENT: Negative.   Eyes: Negative.   Respiratory: Negative.   Cardiovascular: Negative.   Gastrointestinal: Negative.   Genitourinary: Negative.   Musculoskeletal: Negative.   Skin: Negative.   Neurological: Negative.     Blood pressure 126/72, pulse 98, temperature (!) 97.5 F (36.4 C), temperature source Oral, resp. rate 16, height 5\' 7"  (1.702 m), weight 95.3 kg, SpO2 97 %.Body mass index is 32.91 kg/m.  General Appearance: Casual and Fairly Groomed  Eye Contact:  Good  Speech:  Clear and Coherent and Normal Rate  Volume:  Normal  Mood:  Depressed  Affect:  Appropriate and Depressed  Thought Process:  Coherent, Goal Directed and Descriptions of Associations: Intact  Orientation:  Full (Time, Place, and Person)  Thought Content:  WDL and Logical  Suicidal Thoughts:  No  Homicidal Thoughts:  No  Memory:  Immediate;   Good Recent;   Good Remote;   Good  Judgement:  Good  Insight:  Good  Psychomotor Activity:  Normal  Concentration:  Concentration: Good and Attention Span: Good  Recall:   Good  Fund of Knowledge:  Good  Language:  Good  Akathisia:  No  Handed:  Right  AIMS (if indicated):     Assets:  Communication Skills Desire for Improvement Financial Resources/Insurance Housing Intimacy Leisure Time Physical Health Resilience Social Support Talents/Skills  ADL's:  Intact  Cognition:  WNL  Sleep:        Treatment Plan Summary: Plan Follow-up with established outpatient psychiatry  Continue psychiatric home medications. Case discussed with Dr. Dwyane Dee.  Disposition: No evidence of imminent risk to self or others at present.   Patient does not meet criteria for psychiatric inpatient admission. Discussed crisis plan, support from social network, calling 911, coming  to the Emergency Department, and calling Suicide Hotline.  Emmaline Kluver, FNP 05/17/2019 12:59 PM

## 2019-05-17 NOTE — TOC Progression Note (Signed)
Transition of Care [facility]) - Progression Note    Patient Details  Name: [name] MRN: [id number] Date of Birth: 11-Aug-1965  Transition of Care [facility]) CM/SW Contact  Purcell Mouton, RN Phone Number: 05/17/2019, 11:38 AM  Clinical Narrative:    TOC will follow for discharge needs.   Expected Discharge Plan: Home/Self Care Barriers to Discharge: No Barriers Identified  Expected Discharge Plan and Services Expected Discharge Plan: Home/Self Care   Discharge Planning Services: CM Consult   Living arrangements for the past 2 months: Single Family Home                                       Social Determinants of Health (SDOH) Interventions    Readmission Risk Interventions No flowsheet data found.

## 2019-05-17 NOTE — Progress Notes (Signed)
PROGRESS NOTE    [name]  [id number] DOB: 1965/03/25 DOA: 05/13/2019 PCP: Brunetta Jeans, PA-C   Brief Narrative: 54 year old with past medical history significant for anxiety, bipolar, migraine, OSA, chronic pain syndrome who presented with nausea and vomiting and cough and shortness of breath.  The symptoms started the morning of admission.  She vomited 5-6 times nonbilious emesis.  She also started to have a nonproductive cough and shortness of breath after.  CT angio negative for PE left lower lobe pneumonia and thickening of the upper esophagus.  Patient admitted for aspiration pneumonia.  She underwent endoscopy due to abnormal CT finding and concern for aspiration.  Endoscopy showed benign appearing esophageal stricture.  Status post dilation.  Patient also report her taking multiple dose of tramadol at the same time.  Psychiatry was consulted for evaluation of overdose.  Patient was cleared by psych.  Assessment & Plan:   Active Problems:   Schizoaffective disorder, bipolar type (HCC)   Aspiration pneumonia (HCC)   PNA (pneumonia)   Community acquired pneumonia of left lower lobe of lung   Esophageal thickening   Abnormal CT scan, esophagus  1-Acute hypoxic respiratory failure, secondary to pneumonia probably aspiration pneumonia: -Continue with Unasyn, day 5/7. -Start scheduled nebulizer treatment. -Oxygen saturation on room air improved.  2-Aspiration pneumonia; Suspect related to vomiting, also CT show abnormal tissue upper esophagus. No significant evidence of pharyngeal dysphagia.  Patient underwent endoscopy which showed benign appearing Esophageal stricture status post dilation  3-Soft tissue swelling in the upper esophagus: GI consulted. Esophagogram was initially ordered, after discussed case with radiologist.  We will proceed first with a speech and swallow evaluation prior to an esophagogram.  We could also consider doing endoscopy first. Endoscopy  showed esophageal stricture status post dilation.   4-Acute metabolic encephalopathy: Secondary to hypoxemia and infectious process.  Treating underlying cause. Alert, following command. Improved.   5-Sleep apnea: CPAP  Bipolar disorder continue with home medication.  History of intentional opioid overdose.  Patient reported to the nurse that she had an online account where she can buy tramadol.  She  take 10 tramadol at once. Counseling provided to patient. She denies intentional overdose, denies intention to harm herself.  Husband today at bedside, explain to him that he will need to manage patient pain medications. And patient will benefit from tapering this medication.  I inform conern to PCP. Patient follows with pain clinic. I was not able to reach pain clinic office, phone number is not working.  Social worker consulted for  Counseling. Psych consulted, appreciate their evaluation.  Patient has been cleared by psych  LE edema; received 2 doses of Lexis HTN; pressure better today.  Estimated body mass index is 32.91 kg/m as calculated from the following:   Height as of this encounter: 5\' 7"  (1.702 m).   Weight as of this encounter: 95.3 kg.   DVT prophylaxis: Lovenox Code Status: Full code Family Communication: Care discussed with patient Disposition Plan:  Patient is from: Home Anticipated d/c date: 1 or 2 days Barriers to d/c or necessity for inpatient status: Need to make sure patient is able to tolerate diet  Consultants:   GI  Procedures:   None  Antimicrobials:  Unasyn  Subjective: Cough improved.  Denies shortness of breath.  Persistent nausea.  She vomited last night.  Plan for endoscopy today.  Objective: Vitals:   05/17/19 1218 05/17/19 1220 05/17/19 1225 05/17/19 1230  BP:  128/64  126/72  Pulse: Marland Kitchen)  104 100 94 98  Resp: (!) 22 19 18 16   Temp:  (!) 97.5 F (36.4 C)    TempSrc:  Oral    SpO2: 100% 100% 98% 97%  Weight:      Height:         Intake/Output Summary (Last 24 hours) at 05/17/2019 1332 Last data filed at 05/17/2019 1220 Gross per 24 hour  Intake 600 ml  Output 1750 ml  Net -1150 ml   Filed Weights   05/13/19 1123 05/17/19 1131  Weight: 95.3 kg 95.3 kg    Examination:  General exam; NAD Respiratory system: CTA Cardiovascular system: S 1, S 2 RRR Gastrointestinal system: BS present, soft, nt Central nervous system: Alert follows commands Extremities: Symmetric power Skin: No rashes  Data Reviewed: I have personally reviewed following labs and imaging studies  CBC: Recent Labs  Lab 05/13/19 1146 05/14/19 0311 05/15/19 0439 05/16/19 0359  WBC 11.7* 12.8* 7.4 7.3  NEUTROABS 10.4*  --   --   --   HGB 12.0 11.9* 11.1* 11.9*  HCT 38.3 37.8 34.6* 35.3*  MCV 93.6 92.2 92.3 86.9  PLT 130* 137* 157 [id number]   Basic Metabolic Panel: Recent Labs  Lab 05/13/19 1146 05/14/19 0311 05/15/19 0439 05/16/19 0351 05/17/19 0833  NA 139 142 143 142 141  K 4.1 3.9 3.2* 3.3* 3.7  CL 107 108 112* 108 108  CO2 23 25 24 23 22   GLUCOSE 123* 134* 105* 83 98  BUN 13 7 7 9 9   CREATININE 1.08* 0.65 0.77 0.81 0.84  CALCIUM 9.1 8.9 8.5* 9.1 9.2  MG  --   --   --  1.9  --    GFR: Estimated Creatinine Clearance: 91.8 mL/min (by C-G formula based on SCr of 0.84 mg/dL). Liver Function Tests: Recent Labs  Lab 05/13/19 1146  AST 57*  ALT 84*  ALKPHOS 64  BILITOT 0.3  PROT 6.4*  ALBUMIN 3.7   Recent Labs  Lab 05/13/19 1146  LIPASE 37   No results for input(s): AMMONIA in the last 168 hours. Coagulation Profile: No results for input(s): INR, PROTIME in the last 168 hours. Cardiac Enzymes: No results for input(s): CKTOTAL, CKMB, CKMBINDEX, TROPONINI in the last 168 hours. BNP (last 3 results) No results for input(s): PROBNP in the last 8760 hours. HbA1C: No results for input(s): HGBA1C in the last 72 hours. CBG: Recent Labs  Lab 05/13/19 2021 05/13/19 2347 05/14/19 0351 05/15/19 0622  GLUCAP 127*  120* 122* 94   Lipid Profile: No results for input(s): CHOL, HDL, LDLCALC, TRIG, CHOLHDL, LDLDIRECT in the last 72 hours. Thyroid Function Tests: No results for input(s): TSH, T4TOTAL, FREET4, T3FREE, THYROIDAB in the last 72 hours. Anemia Panel: No results for input(s): VITAMINB12, FOLATE, FERRITIN, TIBC, IRON, RETICCTPCT in the last 72 hours. Sepsis Labs: No results for input(s): PROCALCITON, LATICACIDVEN in the last 168 hours.  Recent Results (from the past 240 hour(s))  SARS CORONAVIRUS 2 (TAT 6-24 HRS) Nasopharyngeal Nasopharyngeal Swab     Status: None   Collection Time: 05/13/19  4:00 PM   Specimen: Nasopharyngeal Swab  Result Value Ref Range Status   SARS Coronavirus 2 NEGATIVE NEGATIVE Final    Comment: (NOTE) SARS-CoV-2 target nucleic acids are NOT DETECTED. The SARS-CoV-2 RNA is generally detectable in upper and lower respiratory specimens during the acute phase of infection. Negative results do not preclude SARS-CoV-2 infection, do not rule out co-infections with other pathogens, and should not be used as the sole basis  for treatment or other patient management decisions. Negative results must be combined with clinical observations, patient history, and epidemiological information. The expected result is Negative. Fact Sheet for Patients: SugarRoll.be Fact Sheet for Healthcare Providers: https://www.woods-mathews.com/ This test is not yet approved or cleared by the [place] FDA and  has been authorized for detection and/or diagnosis of SARS-CoV-2 by FDA under an Emergency Use Authorization (EUA). This EUA will remain  in effect (meaning this test can be used) for the duration of the COVID-19 declaration under Section 56 4(b)(1) of the Act, 21 U.S.C. section 360bbb-3(b)(1), unless the authorization is terminated or revoked sooner. Performed at [facility], [place] [address] [place], [place] [address]           Radiology Studies: No results found.      Scheduled Meds: . feeding supplement (ENSURE ENLIVE)  237 mL Oral BID BM  . guaiFENesin  600 mg Oral BID  . lithium carbonate  600 mg Oral QHS  . [START ON 05/18/2019] pantoprazole  40 mg Oral Q0600  . QUEtiapine  400 mg Oral QHS  . traZODone  200 mg Oral QHS   Continuous Infusions: . ampicillin-sulbactam (UNASYN) IV 3 g (05/17/19 1101)     LOS: 4 days    Time spent: 35 minutes.     Elmarie Shiley, MD [facility]   If 7PM-7AM, please contact night-coverage [email or web address]  05/17/2019, 1:32 PM

## 2019-05-17 NOTE — Progress Notes (Signed)
SLP Cancellation Note  Patient Details Name: [name] MRN: [id number] DOB: [birth year]   Cancelled treatment:       Reason Eval/Treat Not Completed: Patient at procedure or test/unavailable. Pt currently off unit for EGD. Will continue efforts.  Hilmar Moldovan B. Quentin Ore, [facility], [place] Speech Language Pathologist Office: [phone] Pager: [phone]  Shonna Chock 05/17/2019, 12:02 PM

## 2019-05-17 NOTE — Op Note (Signed)
[facility] Patient Name: [name] Procedure Date: 05/17/2019 MRN: [id number] Attending MD: Ladene Artist , MD Date of Birth: 1965/10/29 CSN: [id number] Age: 54 Admit Type: Inpatient Procedure:                Upper GI endoscopy Indications:              Abnormal CT of the GI tract (CT of esophagus) Providers:                Pricilla Riffle. Fuller Plan, MD, Elmer Ramp. Tilden Dome, RN, Theodora Blow, Technician, [facility], CRNA Referring MD:             [facility] Medicines:                Monitored Anesthesia Care Complications:            No immediate complications. Estimated Blood Loss:     Estimated blood loss was minimal. Procedure:                Pre-Anesthesia Assessment:                           - Prior to the procedure, a History and Physical                            was performed, and patient medications and                            allergies were reviewed. The patient's tolerance of                            previous anesthesia was also reviewed. The risks                            and benefits of the procedure and the sedation                            options and risks were discussed with the patient.                            All questions were answered, and informed consent                            was obtained. Prior Anticoagulants: The patient has                            taken no previous anticoagulant or antiplatelet                            agents. ASA Grade Assessment: II - A patient with                            mild systemic disease. After reviewing the risks  and benefits, the patient was deemed in                            satisfactory condition to undergo the procedure.                           After obtaining informed consent, the endoscope was                            passed under direct vision. Throughout the                            procedure, the patient's blood pressure, pulse,  and                            oxygen saturations were monitored continuously. The                            [id number] Olympus gastroscope was                            introduced through the mouth, and advanced to the                            second part of duodenum. The upper GI endoscopy was                            accomplished without difficulty. The patient                            tolerated the procedure well. Scope In: Scope Out: Findings:      One benign-appearing, intrinsic, mild, nonobstructing stenosis was found       35 cm from the incisors. This stenosis measured 1.5 cm (inner diameter)       x less than one cm (in length). The stenosis was traversed.      The exam of the esophagus was otherwise normal. Detailed, careful exam       of the entire esophagus did not reveal any other abnormality.      Patchy mild inflammation characterized by erythema, friability and       granularity was found in the gastric fundus and in the gastric body.       Biopsies were taken with a cold forceps for histology.      The exam of the stomach was otherwise normal.      The duodenal bulb and second portion of the duodenum were normal. Impression:               - Benign-appearing esophageal stenosis.                           - Gastritis. Biopsied.                           - Normal duodenal bulb and second portion of the  duodenum. Moderate Sedation:      Not Applicable - Patient had care per Anesthesia. Recommendation:           - Return patient to hospital ward for ongoing care.                           - Resume previous diet.                           - Await pathology results.                           - Pantoprazole 40 mg po qd as outpatient.                           - GI signing off.                           - GI follow up with Dr. Oretha Caprice, consider                            esophageal EUS. Procedure Code(s):        --- Professional  ---                           [phone], Esophagogastroduodenoscopy, flexible,                            transoral; with biopsy, single or multiple Diagnosis Code(s):        --- Professional ---                           K22.2, Esophageal obstruction                           K29.70, Gastritis, unspecified, without bleeding                           R93.3, Abnormal findings on diagnostic imaging of                            other parts of digestive tract CPT copyright 2019 American Medical Association. All rights reserved. The codes documented in this report are preliminary and upon coder review may  be revised to meet current compliance requirements. Ladene Artist, MD 05/17/2019 [id number] PM This report has been signed electronically. Number of Addenda: 0

## 2019-05-17 NOTE — Transfer of Care (Signed)
Immediate Anesthesia Transfer of Care Note  Patient: [name]  Procedure(s) Performed: ESOPHAGOGASTRODUODENOSCOPY (EGD) WITH PROPOFOL (N/A ) BIOPSY  Patient Location: PACU  Anesthesia Type:MAC  Level of Consciousness: awake, alert  and oriented  Airway & Oxygen Therapy: Patient Spontanous Breathing and Patient connected to face mask oxygen  Post-op Assessment: Report given to RN and Post -op Vital signs reviewed and stable  Post vital signs: Reviewed and stable  Last Vitals:  Vitals Value Taken Time  BP 123/67 05/17/19 1217  Temp    Pulse 100 05/17/19 1220  Resp 19 05/17/19 1220  SpO2 100 % 05/17/19 1220    Last Pain:  Vitals:   05/17/19 1131  TempSrc: Oral  PainSc: 0-No pain      Patients Stated Pain Goal: 3 (06/04/20 4825)  Complications: No apparent anesthesia complications

## 2019-05-17 NOTE — Progress Notes (Signed)
Pt has flat affect and refused bath and participate in care. Pt c/o of constant nausea. Will update MD and make her aware. SRP, RN

## 2019-05-17 NOTE — Plan of Care (Signed)
  Problem: Education: Goal: Knowledge of General Education information will improve Description Including pain rating scale, medication(s)/side effects and non-pharmacologic comfort measures Outcome: Progressing   

## 2019-05-17 NOTE — Anesthesia Procedure Notes (Signed)
Performed by: Natalina Wieting D, CRNA Oxygen Delivery Method: Simple face mask       

## 2019-05-17 NOTE — Anesthesia Postprocedure Evaluation (Signed)
Anesthesia Post Note  Patient: [name]  Procedure(s) Performed: ESOPHAGOGASTRODUODENOSCOPY (EGD) WITH PROPOFOL (N/A ) BIOPSY     Patient location during evaluation: Endoscopy Anesthesia Type: MAC Level of consciousness: awake and alert Pain management: pain level controlled Vital Signs Assessment: post-procedure vital signs reviewed and stable Respiratory status: spontaneous breathing, nonlabored ventilation, respiratory function stable and patient connected to nasal cannula oxygen Cardiovascular status: blood pressure returned to baseline and stable Postop Assessment: no apparent nausea or vomiting Anesthetic complications: no    Last Vitals:  Vitals:   05/17/19 1225 05/17/19 1230  BP:  126/72  Pulse: 94 98  Resp: 18 16  Temp:    SpO2: 98% 97%    Last Pain:  Vitals:   05/17/19 1225  TempSrc:   PainSc: 0-No pain                 Barnet Glasgow

## 2019-05-18 ENCOUNTER — Telehealth: Payer: Self-pay

## 2019-05-18 ENCOUNTER — Encounter: Payer: Self-pay | Admitting: *Deleted

## 2019-05-18 ENCOUNTER — Other Ambulatory Visit: Payer: Self-pay

## 2019-05-18 DIAGNOSIS — G894 Chronic pain syndrome: Secondary | ICD-10-CM

## 2019-05-18 DIAGNOSIS — F314 Bipolar disorder, current episode depressed, severe, without psychotic features: Secondary | ICD-10-CM

## 2019-05-18 DIAGNOSIS — K222 Esophageal obstruction: Secondary | ICD-10-CM

## 2019-05-18 DIAGNOSIS — K2970 Gastritis, unspecified, without bleeding: Secondary | ICD-10-CM

## 2019-05-18 LAB — BASIC METABOLIC PANEL
Anion gap: 9 (ref 5–15)
BUN: 12 mg/dL (ref 6–20)
CO2: 21 mmol/L — ABNORMAL LOW (ref 22–32)
Calcium: 8.8 mg/dL — ABNORMAL LOW (ref 8.9–10.3)
Chloride: 111 mmol/L (ref 98–111)
Creatinine, Ser: 0.91 mg/dL (ref 0.44–1.00)
GFR calc Af Amer: >60 mL/min (ref >60)
GFR calc non Af Amer: >60 mL/min (ref >60)
Glucose, Bld: 101 mg/dL — ABNORMAL HIGH (ref 70–99)
Potassium: 3.5 mmol/L (ref 3.5–5.1)
Sodium: 141 mmol/L (ref 135–145)

## 2019-05-18 LAB — CBC
HCT: 37 % (ref 36.0–46.0)
Hemoglobin: 12.1 g/dL (ref 12.0–15.0)
MCH: 29.2 pg (ref 26.0–34.0)
MCHC: 32.7 g/dL (ref 30.0–36.0)
MCV: 89.4 fL (ref 80.0–100.0)
Platelets: 192 10*3/uL (ref 150–400)
RBC: 4.14 MIL/uL (ref 3.87–5.11)
RDW: 12.8 % (ref 11.5–15.5)
WBC: 6.4 10*3/uL (ref 4.0–10.5)
nRBC: 0 % (ref 0.0–0.2)

## 2019-05-18 LAB — SURGICAL PATHOLOGY

## 2019-05-18 MED ORDER — GUAIFENESIN ER 600 MG PO TB12: 600.0000 mg | ORAL_TABLET | Freq: Two times a day (BID) | ORAL | 0 refills | Status: AC

## 2019-05-18 MED ORDER — PANTOPRAZOLE SODIUM 40 MG PO TBEC: 40.0000 mg | DELAYED_RELEASE_TABLET | Freq: Every day | ORAL | 1 refills | Status: DC

## 2019-05-18 MED ORDER — ENSURE ENLIVE PO LIQD: 237.0000 mL | Freq: Two times a day (BID) | ORAL | 12 refills | Status: DC

## 2019-05-18 MED ORDER — AMOXICILLIN-POT CLAVULANATE 875-125 MG PO TABS: 1.0000 | ORAL_TABLET | Freq: Two times a day (BID) | ORAL | 0 refills | Status: AC

## 2019-05-18 NOTE — Progress Notes (Signed)
Discharge instructions reviewed with patient. Questions cocnerns denied. Inhaler from room given. Patient A&Ox4, ambulatory at discharge.

## 2019-05-18 NOTE — Discharge Summary (Signed)
[name] [id number] DOB: Jan 07, 1966 DOA: 05/13/2019  PCP: Brunetta Jeans, PA-C  Admit date: 05/13/2019 Discharge date: 05/18/2019  Admitted From: Home Disposition: Home  Recommendations for Outpatient Follow-up:  1. Follow up with PCP in 1-2 weeks 2. Augmentin x 1 day. Continue Protonix.  3. GI follow up in 4 weeks with Dr. Ardis Hughs 4. Recommend repeat CT scan ( enlarged left pericarinal lymph node) in 4 weeks to document resolution of pneumonia and lymph node enlargement   Home Health: None Equipment/Devices: None  Discharge Condition: Stable CODE STATUS: Full code   Brief/Interim Summary: History of present illness:  CELESE [name] is a 54 y.o. year old female with medical history significant for anxiety, bipolar affective disorder, migraine headaches, OSA, GERD, chronic pain syndrome who presented on 05/13/2019 with nausea, nonbloody/nonbilious emesis x5-6 episodes followed by nonproductive cough and shortness of breath.  In the ED, she was tachycardic to 114-126, saturation 94% on 2 L, and found to have a D-dimer 0.8.  Underwent CT a of chest which was negative for PE but showed left lower lobe airspace opacity concerning for pneumonia and soft tissue fullness in the upper thoracic esophagus.  Patient was admitted for working diagnosis of pneumonia with some concern for aspiration and dysphagia-related to esophagus dysfunction.  Remaining hospital course addressed in problem based format below:   Hospital Course:   Acute hypoxic respiratory failure aspiration pneumonia/pneumonitis. Cough, shortness of breath in setting of preceding vomiting concerning for pneumonitis.  CT scan did show left-sided lung disease which is atypical of aspiration.  From a respiratory standpoint patient improved with no systemic signs or symptoms on Unasyn, and was able to wean off O2 and maintaining normal oxygen saturations on room air, patient will continue Augmentin on discharge for 1 day to  complete total 7 days of antibiotic therapy.  Recommend repeat CT scan ( enlarged left pericarinal lymph node) in 4 weeks to document resolution of pneumonia  Acute metabolic encephalopathy.   In the setting of hypoxemia/infection due to above.  Resolved with treatment of above conditions.  On discharge patient was alert and oriented x3.  Abnormal CT scan of the esophagus, found to have esophageal stenosis and gastritis Patient underwent esophageal dilation via EGD with improvement in symptoms.  Patient was able to tolerate soft diet with no recurrent episodes.  Patient will continue on PPI qd given gastritis on EGD as recommended by GI, will also follow-up with GI as outpatient (Dr. Oretha [name])  Chronic pain syndrome.   Patient was evaluated by psychiatry given patient took her tramadol not as prescribed while in the hospital (she took 6 tabs at once).  Patient had good insight, awareness, per psychiatry evaluation does not meet criteria for inpatient psychiatric care.  Counseling was provided to patient and husband by hospitalist team and social worker.  Psychiatry also recommended continue follow-up with her outpatient psychiatry provider.  No narcotics were prescribed on discharge   Consultations:  GI  Procedures/Studies: Upper GI EGD, 3/2: 1 benign-appearing, intrinsic, mild nonobstructive esophageal stenosis was found, mild inflammation friability and granularity gastric fundus Subjective: Eating well, no nausea, no vomiting, no abdominal pain, no chest pain.  No shortness of breath.  No coughing. Discharge Exam: Vitals:   05/18/19 0908 05/18/19 1303  BP: 135/82 134/83  Pulse: 86 86  Resp: 16 16  Temp: 98.2 F (36.8 C) 99.4 F (37.4 C)  SpO2: 95% 95%   Vitals:   05/17/19 2056 05/18/19 0525 05/18/19 0908 05/18/19 1303  BP: (!) 142/94 127/85 135/82 134/83  Pulse: 84 86 86 86  Resp: 20 18 16 16   Temp: 98.9 F (37.2 C) 97.6 F (36.4 C) 98.2 F (36.8 C) 99.4 F (37.4 C)   TempSrc: Oral Oral Oral Oral  SpO2: 94% 97% 95% 95%  Weight:      Height:        General: Lying in bed, no apparent distress Eyes: EOMI, anicteric ENT: Oral Mucosa clear and moist Cardiovascular: regular rate and rhythm, no murmurs, rubs or gallops, no edema, Respiratory: Normal respiratory effort on room air, lungs clear to auscultation bilaterally Abdomen: soft, non-distended, non-tender, normal bowel sounds Skin: No Rash Neurologic: Grossly no focal neuro deficit.Mental status AAOx3, speech normal, Psychiatric:Appropriate affect, and mood  Discharge Diagnoses:  Active Problems:   Schizoaffective disorder, bipolar type (HCC)   Bipolar affective disorder, depressed, severe (HCC)   Chronic pain syndrome   Aspiration pneumonia (HCC)   PNA (pneumonia)   Community acquired pneumonia of left lower lobe of lung   Esophageal thickening   Abnormal CT scan, esophagus   Gastritis   Esophageal stenosis    Discharge Instructions  Discharge Instructions    Diet - low sodium heart healthy   Complete by: As directed    Increase activity slowly   Complete by: As directed      Allergies as of 05/18/2019   No Known Allergies     Medication List    STOP taking these medications   busPIRone 15 MG tablet Commonly known as: BUSPAR   diclofenac 50 MG EC tablet Commonly known as: VOLTAREN   gabapentin 300 MG capsule Commonly known as: NEURONTIN   ketoconazole 2 % shampoo Commonly known as: NIZORAL   oxyCODONE-acetaminophen 5-325 MG tablet Commonly known as: PERCOCET/ROXICET   predniSONE 10 MG tablet Commonly known as: DELTASONE   promethazine 25 MG tablet Commonly known as: PHENERGAN     TAKE these medications   albuterol 108 (90 Base) MCG/ACT inhaler Commonly known as: VENTOLIN HFA INHALE 1 TO 2 PUFFS INTO THE LUNGS EVERY 6 HOURS AS NEEDED FOR WHEEZING OR SHORTNESS OF BREATH   amoxicillin-clavulanate 875-125 MG tablet Commonly known as: Augmentin Take 1 tablet by  mouth 2 (two) times daily for 1 day. Start taking on: May 19, 2019   cyclobenzaprine 10 MG tablet Commonly known as: FLEXERIL Take 10 mg by mouth at bedtime as needed for muscle spasms. What changed: Another medication with the same name was removed. Continue taking this medication, and follow the directions you see here.   feeding supplement (ENSURE ENLIVE) Liqd Take 237 mLs by mouth 2 (two) times daily between meals.   guaiFENesin 600 MG 12 hr tablet Commonly known as: MUCINEX Take 1 tablet (600 mg total) by mouth 2 (two) times daily for 2 days.   lithium carbonate 300 MG capsule TAKE 2 CAPSULES(600 MG) BY MOUTH AT BEDTIME   pantoprazole 40 MG tablet Commonly known as: PROTONIX Take 1 tablet (40 mg total) by mouth daily at 6 (six) AM. Start taking on: May 19, 2019   pregabalin 75 MG capsule Commonly known as: LYRICA Take 75 mg by mouth in the morning and at bedtime.   QUEtiapine 400 MG tablet Commonly known as: SEROQUEL TAKE 2 TABLETS(800 MG) BY MOUTH AT BEDTIME FOR MOOD   traMADol 50 MG tablet Commonly known as: ULTRAM TK 1 T PO Q 6 H PRN P   traZODone 100 MG tablet Commonly known as: DESYREL TAKE 2 TABLETS(200 MG) BY MOUTH  AT BEDTIME   zolpidem 10 MG tablet Commonly known as: AMBIEN TAKE 1 TABLET(10 MG) BY MOUTH AT BEDTIME FOR SLEEP      Follow-up Information    Milus Banister, MD. Schedule an appointment as soon as possible for a visit in 4 week(s).   Specialty: Gastroenterology Why: make appointment for GI follow up after hospital Contact information: [address] [place] [address] [phone]          No Known Allergies      The results of significant diagnostics from this hospitalization (including imaging, microbiology, ancillary and laboratory) are listed below for reference.     Microbiology: Recent Results (from the past 240 hour(s))  SARS CORONAVIRUS 2 (TAT 6-24 HRS) Nasopharyngeal Nasopharyngeal Swab     Status: None    Collection Time: 05/13/19  4:00 PM   Specimen: Nasopharyngeal Swab  Result Value Ref Range Status   SARS Coronavirus 2 NEGATIVE NEGATIVE Final    Comment: (NOTE) SARS-CoV-2 target nucleic acids are NOT DETECTED. The SARS-CoV-2 RNA is generally detectable in upper and lower respiratory specimens during the acute phase of infection. Negative results do not preclude SARS-CoV-2 infection, do not rule out co-infections with other pathogens, and should not be used as the sole basis for treatment or other patient management decisions. Negative results must be combined with clinical observations, patient history, and epidemiological information. The expected result is Negative. Fact Sheet for Patients: SugarRoll.be Fact Sheet for Healthcare Providers: https://www.woods-mathews.com/ This test is not yet approved or cleared by the [place] FDA and  has been authorized for detection and/or diagnosis of SARS-CoV-2 by FDA under an Emergency Use Authorization (EUA). This EUA will remain  in effect (meaning this test can be used) for the duration of the COVID-19 declaration under Section 56 4(b)(1) of the Act, 21 U.S.C. section 360bbb-3(b)(1), unless the authorization is terminated or revoked sooner. Performed at [facility], [place] [address] [place], [place] [address]      Labs: BNP (last 3 results) No results for input(s): BNP in the last 8760 hours. Basic Metabolic Panel: Recent Labs  Lab 05/14/19 0311 05/15/19 0439 05/16/19 0351 05/17/19 0833 05/18/19 0338  NA 142 143 142 141 141  K 3.9 3.2* 3.3* 3.7 3.5  CL 108 112* 108 108 111  CO2 25 24 23 22  21*  GLUCOSE 134* 105* 83 98 101*  BUN 7 7 9 9 12   CREATININE 0.65 0.77 0.81 0.84 0.91  CALCIUM 8.9 8.5* 9.1 9.2 8.8*  MG  --   --  1.9  --   --    Liver Function Tests: Recent Labs  Lab 05/13/19 1146  AST 57*  ALT 84*  ALKPHOS 64  BILITOT 0.3  PROT 6.4*  ALBUMIN 3.7    Recent Labs  Lab 05/13/19 1146  LIPASE 37   No results for input(s): AMMONIA in the last 168 hours. CBC: Recent Labs  Lab 05/13/19 1146 05/14/19 0311 05/15/19 0439 05/16/19 0359 05/18/19 0338  WBC 11.7* 12.8* 7.4 7.3 6.4  NEUTROABS 10.4*  --   --   --   --   HGB 12.0 11.9* 11.1* 11.9* 12.1  HCT 38.3 37.8 34.6* 35.3* 37.0  MCV 93.6 92.2 92.3 86.9 89.4  PLT 130* 137* 157 163 192   Cardiac Enzymes: No results for input(s): CKTOTAL, CKMB, CKMBINDEX, TROPONINI in the last 168 hours. BNP: Invalid input(s): POCBNP CBG: Recent Labs  Lab 05/13/19 2021 05/13/19 2347 05/14/19 0351 05/15/19 0622  GLUCAP  127* 120* 122* 94   D-Dimer No results for input(s): DDIMER in the last 72 hours. Hgb A1c No results for input(s): HGBA1C in the last 72 hours. Lipid Profile No results for input(s): CHOL, HDL, LDLCALC, TRIG, CHOLHDL, LDLDIRECT in the last 72 hours. Thyroid function studies No results for input(s): TSH, T4TOTAL, T3FREE, THYROIDAB in the last 72 hours.  Invalid input(s): FREET3 Anemia work up No results for input(s): VITAMINB12, FOLATE, FERRITIN, TIBC, IRON, RETICCTPCT in the last 72 hours. Urinalysis    Component Value Date/Time   COLORURINE YELLOW 05/13/2019 1146   APPEARANCEUR CLEAR 05/13/2019 1146   LABSPEC 1.012 05/13/2019 1146   PHURINE 6.0 05/13/2019 1146   GLUCOSEU NEGATIVE 05/13/2019 1146   HGBUR NEGATIVE 05/13/2019 [place] 05/13/2019 1146   BILIRUBINUR neg 09/14/2013 1717   KETONESUR 5 (A) 05/13/2019 1146   PROTEINUR NEGATIVE 05/13/2019 1146   UROBILINOGEN 0.2 09/27/2014 2015   NITRITE NEGATIVE 05/13/2019 1146   LEUKOCYTESUR TRACE (A) 05/13/2019 1146   Sepsis Labs Invalid input(s): PROCALCITONIN,  WBC,  LACTICIDVEN Microbiology Recent Results (from the past 240 hour(s))  SARS CORONAVIRUS 2 (TAT 6-24 HRS) Nasopharyngeal Nasopharyngeal Swab     Status: None   Collection Time: 05/13/19  4:00 PM   Specimen: Nasopharyngeal Swab   Result Value Ref Range Status   SARS Coronavirus 2 NEGATIVE NEGATIVE Final    Comment: (NOTE) SARS-CoV-2 target nucleic acids are NOT DETECTED. The SARS-CoV-2 RNA is generally detectable in upper and lower respiratory specimens during the acute phase of infection. Negative results do not preclude SARS-CoV-2 infection, do not rule out co-infections with other pathogens, and should not be used as the sole basis for treatment or other patient management decisions. Negative results must be combined with clinical observations, patient history, and epidemiological information. The expected result is Negative. Fact Sheet for Patients: SugarRoll.be Fact Sheet for Healthcare Providers: https://www.woods-mathews.com/ This test is not yet approved or cleared by the [place] FDA and  has been authorized for detection and/or diagnosis of SARS-CoV-2 by FDA under an Emergency Use Authorization (EUA). This EUA will remain  in effect (meaning this test can be used) for the duration of the COVID-19 declaration under Section 56 4(b)(1) of the Act, 21 U.S.C. section 360bbb-3(b)(1), unless the authorization is terminated or revoked sooner. Performed at [facility], [place] [address] [place], [place] [address]      Time coordinating discharge: Over 30 minutes  SIGNED:   Desiree Hane, MD  [facility] 05/18/2019, 3:02 PM Pager   If 7PM-7AM, please contact night-coverage [email or web address] Password [email or web address]

## 2019-05-18 NOTE — Telephone Encounter (Signed)
Transition Care Management Follow-up Telephone Call   Date discharged? 05/18/19    How have you been since you were released from the hospital? Patient states that she is starting to feel better, still weak.    Do you understand why you were in the hospital? Yes, patient was hospitalized due to pneumonia.    Do you understand the discharge instructions? yes   Where were you discharged to? Patient was discharged home.   Items Reviewed:  Medications reviewed: yes  Allergies reviewed: yes  Dietary changes reviewed: yes  Referrals reviewed: yes   Functional Questionnaire:   Activities of Daily Living (ADLs):   She states they are independent in the following: ambulation, bathing and hygiene, feeding, continence, grooming, toileting and dressing States they require assistance with the following: N/A   Any transportation issues/concerns?: no   Any patient concerns? no   Confirmed importance and date/time of follow-up visits scheduled yes  Provider Appointment booked with Raiford Noble, PA 3.9.21 @ 11AM via [email or web address]  Confirmed with patient if condition begins to worsen call PCP or go to the ER.  Patient was given the office number and encouraged to call back with question or concerns.  : yes

## 2019-05-20 ENCOUNTER — Encounter: Payer: Self-pay | Admitting: Gastroenterology

## 2019-05-24 ENCOUNTER — Other Ambulatory Visit: Payer: Self-pay

## 2019-05-24 ENCOUNTER — Ambulatory Visit (INDEPENDENT_AMBULATORY_CARE_PROVIDER_SITE_OTHER): Payer: Medicare Other | Admitting: Physician Assistant

## 2019-05-24 ENCOUNTER — Encounter: Payer: Self-pay | Admitting: Physician Assistant

## 2019-05-24 DIAGNOSIS — K222 Esophageal obstruction: Secondary | ICD-10-CM | POA: Diagnosis not present

## 2019-05-24 DIAGNOSIS — J189 Pneumonia, unspecified organism: Secondary | ICD-10-CM | POA: Diagnosis not present

## 2019-05-24 DIAGNOSIS — R933 Abnormal findings on diagnostic imaging of other parts of digestive tract: Secondary | ICD-10-CM | POA: Diagnosis not present

## 2019-05-24 DIAGNOSIS — K2970 Gastritis, unspecified, without bleeding: Secondary | ICD-10-CM

## 2019-05-24 DIAGNOSIS — F1120 Opioid dependence, uncomplicated: Secondary | ICD-10-CM

## 2019-05-24 NOTE — Progress Notes (Signed)
Virtual Visit via Video   I connected with patient on 05/24/19 at 11:00 AM EST by a video enabled telemedicine application and verified that I am speaking with the correct person using two identifiers.  Location patient: Home Location provider: [name], Office Persons participating in the virtual visit: Patient, Provider, [place] (Patina Moore)  I discussed the limitations of evaluation and management by telemedicine and the availability of in person appointments. The patient expressed understanding and agreed to proceed.  Subjective:   HPI:   Patient presents today via doxy.need today for hospital follow-up/TCM visit. Patient presented to the ER on 05/13/2019 with N/v, cough, SOB of sudden onset. Patient was hypoxic and tachycardic in ER. ER workup included elevated d-dimer so imagine obtained. CTA negative for PE but concerning for left lower lobe pneumonia and esophageal thickening/lesion. Was started on PO steroids, IV Unasyn and breathing treatment.  Patient subsequently admitted to the hospital same day for further assessment/management. During hospitalization hypoxia resolved and patient able to be weaned off of O2. Maintained on Unasyn and transitioned to PO Augmentin to complete on discharge. GI consulted giving the esophageal changes -- underwent EGD with dilation of esophageal stricture. Was started on PPI daily. "lesion" identified as a pericarinal lymph node needing repeat CT in 4 weeks. Discharged on 05/18/19 home.  Since discharge, patient endorses doing well overall.  Is taking her Augmentin as directed.  Notes tolerating well.  Notes significant improvement in her respiratory symptoms.  Still has a slight cough.  Denies fever, chills, chest pain or shortness of breath.  Is taking her Protonix as directed by gastroenterology.  Notes this is helping with reflux.  Denies any abdominal pain, nausea, vomiting or change to bowel habits.  Has not yet scheduled a follow-up with  gastroenterology.  Since being home, patient denies noting any new or worsening symptoms.  In regards to the note in her discharge summary about inappropriate use of her tramadol, patient states she followed up with her pain management specialist on 05/20/2019.  Is only taking medication as directed.  No changes were made to her prescription regimen at that time.  Patient denies any new concerns at today's visit.  ROS:   See pertinent positives and negatives per HPI.  Patient Active Problem List   Diagnosis Date Noted  . Gastritis 05/18/2019  . Esophageal stenosis 05/18/2019  . Community acquired pneumonia of left lower lobe of lung   . Esophageal thickening   . Abnormal CT scan, esophagus   . Aspiration pneumonia ([place]) 05/13/2019  . PNA (pneumonia) 05/13/2019  . Achilles tendon contracture, right 04/18/2016  . OAB (overactive bladder) 02/28/2016  . Urgency incontinence 02/28/2016  . Situational anxiety 06/24/2015  . Mass of arm 04/09/2015  . Deviated septum 11/17/2014  . Gastroesophageal reflux disease without esophagitis 09/02/2014  . Chronic pain syndrome 05/24/2014  . Bereavement 03/13/2014  . Bipolar affective disorder, depressed, severe ([place]) 03/11/2014  . Opiate dependence ([place]) 03/11/2014  . Intentional opiate overdose ([place]) 03/11/2014  . De Quervain's tenosynovitis, left 12/12/2013  . Nausea with vomiting 07/28/2013  . Thrombocytopenia, unspecified ([place]) 07/28/2013  . External hemorrhoids 02/16/2013  . Metatarsalgia, right foot 05/18/2011  . Schizoaffective disorder, bipolar type ([place]) 03/31/2011    Social History   Tobacco Use  . Smoking status: Never Smoker  . Smokeless tobacco: Never Used  Substance Use Topics  . Alcohol use: No    Alcohol/week: 0.0 standard drinks    Current Outpatient Medications:  .  albuterol (VENTOLIN HFA)  108 (90 Base) MCG/ACT inhaler, INHALE 1 TO 2 PUFFS INTO THE LUNGS EVERY 6 HOURS AS NEEDED FOR WHEEZING OR SHORTNESS OF BREATH, Disp: 18  g, Rfl: 3 .  cyclobenzaprine (FLEXERIL) 10 MG tablet, Take 10 mg by mouth at bedtime as needed for muscle spasms. , Disp: , Rfl:  .  lithium carbonate 300 MG capsule, TAKE 2 CAPSULES(600 MG) BY MOUTH AT BEDTIME, Disp: 180 capsule, Rfl: 0 .  pantoprazole (PROTONIX) 40 MG tablet, Take 1 tablet (40 mg total) by mouth daily at 6 (six) AM., Disp: 45 tablet, Rfl: 1 .  pregabalin (LYRICA) 75 MG capsule, Take 75 mg by mouth in the morning and at bedtime., Disp: , Rfl:  .  QUEtiapine (SEROQUEL) 400 MG tablet, TAKE 2 TABLETS(800 MG) BY MOUTH AT BEDTIME FOR MOOD, Disp: 180 tablet, Rfl: 0 .  traMADol (ULTRAM) 50 MG tablet, Take by mouth., Disp: , Rfl:  .  traZODone (DESYREL) 100 MG tablet, TAKE 2 TABLETS(200 MG) BY MOUTH AT BEDTIME, Disp: 180 tablet, Rfl: 0 .  zolpidem (AMBIEN) 10 MG tablet, TAKE 1 TABLET(10 MG) BY MOUTH AT BEDTIME FOR SLEEP, Disp: 30 tablet, Rfl: 1  No Known Allergies  Objective:   There were no vitals taken for this visit.  Patient is well-developed, well-nourished in no acute distress.  Resting comfortably at home.  Head is normocephalic, atraumatic.  No labored breathing.  Speech is clear and coherent with logical content.  Patient is alert and oriented at baseline.   Assessment and Plan:   1. Community acquired pneumonia of left lower lobe of lung Completing PO course of Augmentin. Notes doing very well. Clinically resolving. She is to finish entire course of Augmentin. Supportive measures and OTC medications reviewed.   2. Gastritis without bleeding, unspecified chronicity, unspecified gastritis type 3. Esophageal stenosis S/p dilation. Eating and swallowing without issue. Continue PPI as directed. She is to contact her GI specialist to schedule follow-up.  4. Abnormal CT scan, esophagus Revealed to be a pericarinal lymph node. Reminder placed in system to repeat CT in 4 weeks.   5. Uncomplicated opioid dependence ([place]) Continue management per pain specialist. She  endorses taking no more than as directed.     Leeanne Rio, PA-C 05/24/2019

## 2019-05-31 ENCOUNTER — Other Ambulatory Visit: Payer: Self-pay | Admitting: Physician Assistant

## 2019-06-01 NOTE — Telephone Encounter (Signed)
Next apt 03/24

## 2019-06-06 DIAGNOSIS — K228 Other specified diseases of esophagus: Secondary | ICD-10-CM

## 2019-06-06 DIAGNOSIS — K2289 Other specified disease of esophagus: Secondary | ICD-10-CM

## 2019-06-07 ENCOUNTER — Other Ambulatory Visit: Payer: Self-pay | Admitting: Otolaryngology

## 2019-06-07 DIAGNOSIS — K228 Other specified diseases of esophagus: Secondary | ICD-10-CM

## 2019-06-07 DIAGNOSIS — K2289 Other specified disease of esophagus: Secondary | ICD-10-CM

## 2019-06-08 ENCOUNTER — Ambulatory Visit: Payer: Medicare Other | Admitting: Physician Assistant

## 2019-06-21 ENCOUNTER — Telehealth: Payer: Self-pay | Admitting: Physician Assistant

## 2019-06-21 DIAGNOSIS — R590 Localized enlarged lymph nodes: Secondary | ICD-10-CM

## 2019-06-21 NOTE — Telephone Encounter (Signed)
Patient due for repeat CT Chest for lymph node noted during hospitalization. Order placed. Please remind patient she will be contacted to schedule.

## 2019-06-22 ENCOUNTER — Other Ambulatory Visit: Payer: Self-pay | Admitting: Physician Assistant

## 2019-06-22 NOTE — Telephone Encounter (Signed)
She has apt tomorrow morning, wasn't sure if you wanted to wait till you saw her first

## 2019-06-23 ENCOUNTER — Ambulatory Visit (INDEPENDENT_AMBULATORY_CARE_PROVIDER_SITE_OTHER): Payer: Medicare Other | Admitting: Physician Assistant

## 2019-06-23 ENCOUNTER — Encounter: Payer: Self-pay | Admitting: Physician Assistant

## 2019-06-23 DIAGNOSIS — Z79899 Other long term (current) drug therapy: Secondary | ICD-10-CM

## 2019-06-23 DIAGNOSIS — F411 Generalized anxiety disorder: Secondary | ICD-10-CM

## 2019-06-23 DIAGNOSIS — F250 Schizoaffective disorder, bipolar type: Secondary | ICD-10-CM

## 2019-06-23 MED ORDER — LITHIUM CARBONATE 300 MG PO CAPS: ORAL_CAPSULE | ORAL | 1 refills | Status: DC

## 2019-06-23 MED ORDER — ZOLPIDEM TARTRATE 10 MG PO TABS: ORAL_TABLET | ORAL | 1 refills | Status: DC

## 2019-06-23 MED ORDER — TRAZODONE HCL 100 MG PO TABS: ORAL_TABLET | ORAL | 1 refills | Status: DC

## 2019-06-23 MED ORDER — QUETIAPINE FUMARATE 400 MG PO TABS: ORAL_TABLET | ORAL | 1 refills | Status: DC

## 2019-06-23 NOTE — Progress Notes (Signed)
[facility] Med Check  Patient ID: [name],  MRN: [id number]  PCP: Brunetta Jeans, PA-C  Date of Evaluation: 06/23/2019 Time spent:20 minutes  Chief Complaint:  Chief Complaint    Depression; Insomnia     Virtual Visit via Telephone Note  I connected with patient by a video enabled telemedicine application or telephone, with their informed consent, and verified patient privacy and that I am speaking with the correct person using two identifiers.  I am private, in my office and the patient is home.  I discussed the limitations, risks, security and privacy concerns of performing an evaluation and management service by telephone and the availability of in person appointments. I also discussed with the patient that there may be a patient responsible charge related to this service. The patient expressed understanding and agreed to proceed.   I discussed the assessment and treatment plan with the patient. The patient was provided an opportunity to ask questions and all were answered. The patient agreed with the plan and demonstrated an understanding of the instructions.   The patient was advised to call back or seek an in-person evaluation if the symptoms worsen or if the condition fails to improve as anticipated.  I provided 20 minutes of non-face-to-face time during this encounter.  HISTORY/CURRENT STATUS: HPI For routine med check.  Patient reports feeling well.  A couple of months ago she was in the hospital for pneumonia but has recovered well.  She sleeps good most of the time with the trazodone and Ambien as needed.  Anxiety is not a problem right now.  If she is triggered by something then she can get mildly anxious but is able to work through things well most of the time.  She and her family travel back and forth to [place], living there part-time and here part-time.  Patient denies loss of interest in usual activities and is able to enjoy things.  Denies decreased  energy or motivation.  Appetite has not changed.  No extreme sadness, tearfulness, or feelings of hopelessness.  Denies any changes in concentration, making decisions or remembering things.  Denies suicidal or homicidal thoughts.  Patient denies increased energy with decreased need for sleep, no increased talkativeness, no racing thoughts, no impulsivity or risky behaviors, no increased spending, no increased libido, no grandiosity, no increased irritability or anger, and no hallucinations.  Denies dizziness, syncope, seizures, numbness, tingling, tremor, tics, unsteady gait, slurred speech, confusion. Denies muscle or joint pain, stiffness, or dystonia.  Denies polyuria, Polyphagia, and polydipsia.  No unexplained weight loss.  Denies sores that do not heal.  Individual Medical History/ Review of Systems: Changes? :Yes  Was hospitalized for a week in Feb for pneumonia.   Past medications for mental health diagnoses include: Depakote, Tegretol, BuSpar  Allergies: Patient has no known allergies.  Current Medications:  Current Outpatient Medications:  .  albuterol (VENTOLIN HFA) 108 (90 Base) MCG/ACT inhaler, INHALE 1 TO 2 PUFFS INTO THE LUNGS EVERY 6 HOURS AS NEEDED FOR WHEEZING OR SHORTNESS OF BREATH, Disp: 18 g, Rfl: 3 .  cyclobenzaprine (FLEXERIL) 10 MG tablet, Take 10 mg by mouth at bedtime as needed for muscle spasms. , Disp: , Rfl:  .  lithium carbonate 300 MG capsule, TAKE 2 CAPSULES(600 MG) BY MOUTH AT BEDTIME, Disp: 180 capsule, Rfl: 1 .  pantoprazole (PROTONIX) 40 MG tablet, Take 1 tablet (40 mg total) by mouth daily at 6 (six) AM., Disp: 45 tablet, Rfl: 1 .  pregabalin (LYRICA) 75 MG capsule,  Take 75 mg by mouth in the morning and at bedtime., Disp: , Rfl:  .  QUEtiapine (SEROQUEL) 400 MG tablet, TAKE 2 TABLETS(800 MG) BY MOUTH AT BEDTIME FOR MOOD, Disp: 180 tablet, Rfl: 1 .  traMADol (ULTRAM) 50 MG tablet, Take by mouth., Disp: , Rfl:  .  traZODone (DESYREL) 100 MG tablet, TAKE 2  TABLETS(200 MG) BY MOUTH AT BEDTIME, Disp: 180 tablet, Rfl: 1 .  zolpidem (AMBIEN) 10 MG tablet, TAKE 1 TABLET(10 MG) BY MOUTH AT BEDTIME FOR SLEEP, Disp: 90 tablet, Rfl: 1 Medication Side Effects: none  Family Medical/ Social History: Changes? No  MENTAL HEALTH EXAM:  There were no vitals taken for this visit.There is no height or weight on file to calculate BMI.  General Appearance: Unable to assess  Eye Contact:  Unable to assess  Speech:  Clear and Coherent and Normal Rate  Volume:  Normal  Mood:  Euthymic  Affect:  Unable to assess  Thought Process:  Goal Directed and Descriptions of Associations: Intact  Orientation:  Full (Time, Place, and Person)  Thought Content: Logical   Suicidal Thoughts:  No  Homicidal Thoughts:  No  Memory:  WNL  Judgement:  Good  Insight:  Good  Psychomotor Activity:  Unable to assess  Concentration:  Concentration: Good  Recall:  Good  Fund of Knowledge: Good  Language: Good  Assets:  Desire for Improvement  ADL's:  Intact  Cognition: WNL  Prognosis:  Good  05/18/2019 CBC nl, Glucose 101,  BUN/CR 12/0.9  DIAGNOSES:    ICD-10-CM   1. Encounter for long-term (current) use of medications  Z79.899 TSH    Lipid panel    Comprehensive metabolic panel    Lithium level    Hemoglobin A1c  2. Schizoaffective disorder, bipolar type ([place])  F25.0   3. Generalized anxiety disorder  F41.1     Receiving Psychotherapy: No    RECOMMENDATIONS:  PDMP was reviewed. I spent 20 minutes with her. I am unsure why she is no longer on BuSpar.  It is not listed on the medication list and she did not mention it or the reason she is off.  I will will not represcribe since she is doing well off of it. Continue lithium 300 mg, 2 p.o. nightly. Continue Seroquel 400 mg, 2 p.o. nightly. Continue trazodone 100 mg, 2 p.o. nightly as needed. Continue Ambien 10 mg 1 nightly as needed sleep. It is time for her labs to be drawn.  Ordered fasting, as above. Return in 6  months.  Donnal Moat, PA-C

## 2019-06-24 NOTE — Telephone Encounter (Signed)
Left a detailed message on patient VM of repeat CT Chest ordered for repeat from CT Chest completed in hospital. Patient will be call to schedule

## 2019-07-04 ENCOUNTER — Other Ambulatory Visit: Payer: Self-pay

## 2019-07-04 ENCOUNTER — Ambulatory Visit (INDEPENDENT_AMBULATORY_CARE_PROVIDER_SITE_OTHER): Payer: Medicare Other | Admitting: Physician Assistant

## 2019-07-04 ENCOUNTER — Ambulatory Visit: Payer: Medicare Other | Admitting: Physician Assistant

## 2019-07-04 ENCOUNTER — Encounter: Payer: Self-pay | Admitting: Physician Assistant

## 2019-07-04 VITALS — BP 110/78 | HR 82 | Temp 97.9°F | Resp 16 | Ht 67.0 in | Wt 224.0 lb

## 2019-07-04 DIAGNOSIS — R0789 Other chest pain: Secondary | ICD-10-CM

## 2019-07-04 NOTE — Progress Notes (Signed)
Patient presents to clinic today c/o very short-lived episodes of chest tightness/pressure lasting a few seconds and associated with chest tightness and wheeze, before resolving.  Happens a few times throughout the day.  Happens mainly at rest.  Does not wake her from sleep.  Denies any overall chest tightness, wheezing or shortness of breath.  Denies shortness of breath on exertion.  Denies fever, chills, malaise or fatigue.  Denies chest congestion or nasal congestion.  Has significant history of heartburn, currently taking Protonix 40 mg once daily.  Notes occasional breakthrough symptoms.  Denies epigastric pain or change to bowel/bladder habits.  Patient states otherwise she feels fine.  Has been going about normal activities.  Notes good appetite and good hydration.  Of note, patient is overdue for repeat CT chest due to enlarged node found during hospitalization.  This has been scheduled but schedulers have been unable to contact patient to get this set up.  Past Medical History:  Diagnosis Date  . Anxiety   . Bipolar affective disorder, mixed, in partial or remission   . Depression   . Dysrhythmia    Left sided heart diease  . Headache(784.0)   . History of gallstones   . Kidney stones   . Mental disorder   . Migraine   . Pneumonia    Hx: of as a child  . PONV (postoperative nausea and vomiting)   . Renal disorder   . Sleep apnea    sleep test 2014, did not have osa  . 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 albuterol (VENTOLIN HFA) 108 (90 Base) MCG/ACT inhaler INHALE 1 TO 2 PUFFS INTO THE LUNGS EVERY 6 HOURS AS NEEDED FOR WHEEZING OR SHORTNESS OF BREATH 18 g 3  . cyclobenzaprine (FLEXERIL) 10 MG tablet Take 10 mg by mouth at bedtime as needed for muscle spasms.     Marland Kitchen lithium carbonate 300 MG capsule TAKE 2 CAPSULES(600 MG) BY MOUTH AT BEDTIME 180 capsule 1  .  pantoprazole (PROTONIX) 40 MG tablet Take 1 tablet (40 mg total) by mouth daily at 6 (six) AM. 45 tablet 1  . pregabalin (LYRICA) 75 MG capsule Take 75 mg by mouth in the morning and at bedtime.    Marland Kitchen QUEtiapine (SEROQUEL) 400 MG tablet TAKE 2 TABLETS(800 MG) BY MOUTH AT BEDTIME FOR MOOD 180 tablet 1  . traMADol (ULTRAM) 50 MG tablet Take by mouth.    . traZODone (DESYREL) 100 MG tablet TAKE 2 TABLETS(200 MG) BY MOUTH AT BEDTIME 180 tablet 1  . zolpidem (AMBIEN) 10 MG tablet TAKE 1 TABLET(10 MG) BY MOUTH AT BEDTIME FOR SLEEP 90 tablet 1   No current facility-administered medications on file prior to visit.    No Known Allergies  Family History  Problem Relation Age of Onset  . Colon cancer Father 33  . Breast cancer Father   . Breast cancer Mother   . Uterine cancer Mother   . Stomach cancer Neg Hx   . Rectal cancer Neg Hx   . Esophageal cancer Neg Hx     Social History   Socioeconomic History  . Marital status: Married    Spouse name: Not on file  . Number of children: 6  . Years of education: Not on file  . Highest education level: Not on file  Occupational History  . Occupation: Presenter, broadcasting  Tobacco Use  .  Smoking status: Never Smoker  . Smokeless tobacco: Never Used  Substance and Sexual Activity  . Alcohol use: No    Alcohol/week: 0.0 standard drinks  . Drug use: No    Types: Other-see comments, Fentanyl, Hydrocodone  . Sexual activity: Not Currently    Birth control/protection: Surgical  Other Topics Concern  . 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 Difficulty of Paying Living Expenses:   Food Insecurity:   . Worried About  Charity fundraiser in the Last Year:   . Arboriculturist in the Last Year:   Transportation Needs:   . Film/video editor (Medical):   Marland Kitchen Lack of Transportation (Non-Medical):   Physical Activity:   . Days of Exercise per Week:   . Minutes of Exercise per Session:   Stress:   . Feeling of Stress :   Social Connections:   . Frequency of Communication with Friends and Family:   . Frequency of Social Gatherings with Friends and Family:   . Attends Religious Services:   . Active Member of Clubs or Organizations:   . Attends Archivist Meetings:   Marland Kitchen Marital Status:     Review of Systems - See HPI.  All other ROS are negative.  BP 110/78   Pulse 82   Temp 97.9 F (36.6 C) (Temporal)   Resp 16   Ht _0  (1.702 m)   Wt 224 lb (101.6 kg)   SpO2 97%   BMI 35.08 kg/m   Physical Exam Constitutional:      Appearance: She is well-developed.  HENT:     Head: Normocephalic and atraumatic.  Eyes:     Pupils: Pupils are equal, round, and reactive to light.  Cardiovascular:     Rate and Rhythm: Normal rate and regular rhythm.  No extrasystoles are present.    Heart sounds: Normal heart sounds.  Pulmonary:     Effort: Pulmonary effort is normal.     Breath sounds: Normal breath sounds.  Chest:     Chest wall: No deformity or tenderness.  Neurological:     General: No focal deficit present.     Mental Status: She is alert.  Psychiatric:        Mood and Affect: Mood normal.     Recent Results (from the past 2160 hour(s))  CBC with Differential     Status: Abnormal   Collection Time: 05/13/19 11:46 AM  Result Value Ref Range   WBC 11.7 (H) 4.0 - 10.5 K/uL   RBC 4.09 3.87 - 5.11 MIL/uL   Hemoglobin 12.0 12.0 - 15.0 g/dL   HCT 38.3 36.0 - 46.0 %   MCV 93.6 80.0 - 100.0 fL   MCH 29.3 26.0 - 34.0 pg   MCHC 31.3 30.0 - 36.0 g/dL   RDW 13.0 11.5 - 15.5 %   Platelets 130 (L) 150 - 400 K/uL   nRBC 0.0 0.0 - 0.2 %   Neutrophils Relative % 90 %   Neutro Abs 10.4  (H) 1.7 - 7.7  K/uL   Lymphocytes Relative 5 %   Lymphs Abs 0.6 (L) 0.7 - 4.0 K/uL   Monocytes Relative 5 %   Monocytes Absolute 0.6 0.1 - 1.0 K/uL   Eosinophils Relative 0 %   Eosinophils Absolute 0.0 0.0 - 0.5 K/uL   Basophils Relative 0 %   Basophils Absolute 0.0 0.0 - 0.1 K/uL   Immature Granulocytes 0 %   Abs Immature Granulocytes 0.05 0.00 - 0.07 K/uL    Comment: Performed at [facility], [place] [address] [place], [place] [address]  Comprehensive metabolic panel     Status: Abnormal   Collection Time: 05/13/19 11:46 AM  Result Value Ref Range   Sodium 139 135 - 145 mmol/L   Potassium 4.1 3.5 - 5.1 mmol/L   Chloride 107 98 - 111 mmol/L   CO2 23 22 - 32 mmol/L   Glucose, Bld 123 (H) 70 - 99 mg/dL    Comment: Glucose reference range applies only to samples taken after fasting for at least 8 hours.   BUN 13 6 - 20 mg/dL   Creatinine, Ser 1.08 (H) 0.44 - 1.00 mg/dL   Calcium 9.1 8.9 - 10.3 mg/dL   Total Protein 6.4 (L) 6.5 - 8.1 g/dL   Albumin 3.7 3.5 - 5.0 g/dL   AST 57 (H) 15 - 41 U/L   ALT 84 (H) 0 - 44 U/L   Alkaline Phosphatase 64 38 - 126 U/L   Total Bilirubin 0.3 0.3 - 1.2 mg/dL   GFR calc non Af Amer 59 (L) >60 mL/min   GFR calc Af Amer >60 >60 mL/min   Anion gap 9 5 - 15    Comment: Performed at [facility], [place] [address], [place], [place] [address]  Lipase, blood     Status: None   Collection Time: 05/13/19 11:46 AM  Result Value Ref Range   Lipase 37 11 - 51 U/L    Comment: Performed at [facility], [place] [address] [place], [place] [address]  Troponin I (High Sensitivity)     Status: None   Collection Time: 05/13/19 11:46 AM  Result Value Ref Range   Troponin I (High Sensitivity) <2 <18 ng/L    Comment: Performed at [facility], [place] [address], [place], [place] [address]  Urinalysis, Routine w reflex microscopic     Status: Abnormal   Collection Time: 05/13/19 11:46 AM  Result  Value Ref Range   Color, Urine YELLOW YELLOW   APPearance CLEAR CLEAR   Specific Gravity, Urine 1.012 1.005 - 1.030   pH 6.0 5.0 - 8.0   Glucose, UA NEGATIVE NEGATIVE mg/dL   Hgb urine dipstick NEGATIVE NEGATIVE   Bilirubin Urine NEGATIVE NEGATIVE   Ketones, ur 5 (A) NEGATIVE mg/dL   Protein, ur NEGATIVE NEGATIVE mg/dL   Nitrite NEGATIVE NEGATIVE   Leukocytes,Ua TRACE (A) NEGATIVE   RBC / HPF 0-5 0 - 5 RBC/hpf   WBC, UA 0-5 0 - 5 WBC/hpf   Bacteria, UA RARE (A) NONE SEEN   Squamous Epithelial / LPF 0-5 0 - 5    Comment: Performed at [facility], [place] [address], [place], [place] [address]  D-dimer, quantitative (not at [facility])     Status: Abnormal   Collection Time: 05/13/19 11:46 AM  Result Value Ref Range   D-Dimer, Quant 0.80 (H) 0.00 - 0.50 ug/mL-FEU    Comment: (NOTE) At the manufacturer cut-off of 0.50 ug/mL FEU, this assay has  been documented to exclude PE with a sensitivity and negative predictive value of 97 to 99%.  At this time, this assay has not been approved by the FDA to exclude DVT/VTE. Results should be correlated with clinical presentation. Performed at [facility], [place] [address], [place], [place] [address]   Troponin I (High Sensitivity)     Status: None   Collection Time: 05/13/19  3:05 PM  Result Value Ref Range   Troponin I (High Sensitivity) <2 <18 ng/L    Comment: (NOTE) Elevated high sensitivity troponin I (hsTnI) values and significant  changes across serial measurements may suggest ACS but many other  chronic and acute conditions are known to elevate hsTnI results.  Refer to the Links section for chest pain algorithms and additional  guidance. Performed at [facility], [place] [address] [place], [place] [address]   POC SARS Coronavirus 2 Ag-ED - Nasal Swab (BD Veritor Kit)     Status: None   Collection Time: 05/13/19  3:48 PM  Result Value Ref Range   SARS Coronavirus 2 Ag NEGATIVE  NEGATIVE    Comment: (NOTE) SARS-CoV-2 antigen NOT DETECTED.  Negative results are presumptive.  Negative results do not preclude SARS-CoV-2 infection and should not be used as the sole basis for treatment or other patient management decisions, including infection  control decisions, particularly in the presence of clinical signs and  symptoms consistent with COVID-19, or in those who have been in contact with the virus.  Negative results must be combined with clinical observations, patient history, and epidemiological information. The expected result is Negative. Fact Sheet for Patients: PodPark.tn Fact Sheet for Healthcare Providers: GiftContent.is This test is not yet approved or cleared by the [place] FDA and  has been authorized for detection and/or diagnosis of SARS-CoV-2 by FDA under an Emergency Use Authorization (EUA).  This EUA will remain in effect (meaning this test can be used) for the duration of  the COVID-19 de claration under Section 564(b)(1) of the Act, 21 U.S.C. section 360bbb-3(b)(1), unless the authorization is terminated or revoked sooner.   SARS CORONAVIRUS 2 (TAT 6-24 HRS) Nasopharyngeal Nasopharyngeal Swab     Status: None   Collection Time: 05/13/19  4:00 PM   Specimen: Nasopharyngeal Swab  Result Value Ref Range   SARS Coronavirus 2 NEGATIVE NEGATIVE    Comment: (NOTE) SARS-CoV-2 target nucleic acids are NOT DETECTED. The SARS-CoV-2 RNA is generally detectable in upper and lower respiratory specimens during the acute phase of infection. Negative results do not preclude SARS-CoV-2 infection, do not rule out co-infections with other pathogens, and should not be used as the sole basis for treatment or other patient management decisions. Negative results must be combined with clinical observations, patient history, and epidemiological information. The expected result is Negative. Fact Sheet  for Patients: SugarRoll.be Fact Sheet for Healthcare Providers: https://www.woods-mathews.com/ This test is not yet approved or cleared by the [place] FDA and  has been authorized for detection and/or diagnosis of SARS-CoV-2 by FDA under an Emergency Use Authorization (EUA). This EUA will remain  in effect (meaning this test can be used) for the duration of the COVID-19 declaration under Section 56 4(b)(1) of the Act, 21 U.S.C. section 360bbb-3(b)(1), unless the authorization is terminated or revoked sooner. Performed at [facility], [place] [address] [place],  [address]   Blood gas, arterial     Status: Abnormal   Collection Time: 05/13/19  5:15 PM  Result Value Ref Range  FIO2 28.00    Delivery systems NO CHARGE    pH, Arterial 7.265 (L) 7.350 - 7.450   pCO2 arterial 53.8 (H) 32.0 - 48.0 mmHg   pO2, Arterial 64.3 (L) 83.0 - 108.0 mmHg   Bicarbonate 23.6 20.0 - 28.0 mmol/L   Acid-base deficit 3.4 (H) 0.0 - 2.0 mmol/L   O2 Saturation 91.2 %   Patient temperature 98.6    Allens test (pass/fail) PASS PASS    Comment: Performed at [facility], [place], [place] [address], [place], [place] [address]  Glucose, capillary     Status: Abnormal   Collection Time: 05/13/19  8:21 PM  Result Value Ref Range   Glucose-Capillary 127 (H) 70 - 99 mg/dL    Comment: Glucose reference range applies only to samples taken after fasting for at least 8 hours.  Glucose, capillary     Status: Abnormal   Collection Time: 05/13/19 11:47 PM  Result Value Ref Range   Glucose-Capillary 120 (H) 70 - 99 mg/dL    Comment: Glucose reference range applies only to samples taken after fasting for at least 8 hours.  HIV Antibody (routine testing w rflx)     Status: None   Collection Time: 05/14/19  3:11 AM  Result Value Ref Range   HIV Screen 4th Generation wRfx NON REACTIVE NON REACTIVE    Comment: Performed at [facility], [address] [place], [place] [address]  CBC     Status: Abnormal   Collection Time: 05/14/19  3:11 AM  Result Value Ref Range   WBC 12.8 (H) 4.0 - 10.5 K/uL   RBC 4.10 3.87 - 5.11 MIL/uL   Hemoglobin 11.9 (L) 12.0 - 15.0 g/dL   HCT 37.8 36.0 - 46.0 %   MCV 92.2 80.0 - 100.0 fL   MCH 29.0 26.0 - 34.0 pg   MCHC 31.5 30.0 - 36.0 g/dL   RDW 12.7 11.5 - 15.5 %   Platelets 137 (L) 150 - 400 K/uL   nRBC 0.0 0.0 - 0.2 %    Comment: Performed at [facility], [place] [address], [place], [place] [address]  Basic metabolic panel     Status: Abnormal   Collection Time: 05/14/19  3:11 AM  Result Value Ref Range   Sodium 142 135 - 145 mmol/L   Potassium 3.9 3.5 - 5.1 mmol/L   Chloride 108 98 - 111 mmol/L   CO2 25 22 - 32 mmol/L   Glucose, Bld 134 (H) 70 - 99 mg/dL    Comment: Glucose reference range applies only to samples taken after fasting for at least 8 hours.   BUN 7 6 - 20 mg/dL   Creatinine, Ser 0.65 0.44 - 1.00 mg/dL   Calcium 8.9 8.9 - 10.3 mg/dL   GFR calc non Af Amer >60 >60 mL/min   GFR calc Af Amer >60 >60 mL/min   Anion gap 9 5 - 15    Comment: Performed at [facility], [place] [address], [place], [place] [address]  Glucose, capillary     Status: Abnormal   Collection Time: 05/14/19  3:51 AM  Result Value Ref Range   Glucose-Capillary 122 (H) 70 - 99 mg/dL    Comment: Glucose reference range applies only to samples taken after fasting for at least 8 hours.  CBC     Status: Abnormal   Collection Time: 05/15/19  4:39 AM  Result Value Ref Range   WBC 7.4 4.0 - 10.5 K/uL  RBC 3.75 (L) 3.87 - 5.11 MIL/uL   Hemoglobin 11.1 (L) 12.0 - 15.0 g/dL   HCT 34.6 (L) 36.0 - 46.0 %   MCV 92.3 80.0 - 100.0 fL   MCH 29.6 26.0 - 34.0 pg   MCHC 32.1 30.0 - 36.0 g/dL   RDW 12.6 11.5 - 15.5 %   Platelets 157 150 - 400 K/uL   nRBC 0.0 0.0 - 0.2 %    Comment: Performed at [facility], [place] [address] [place], [place] [address]  Basic metabolic  panel     Status: Abnormal   Collection Time: 05/15/19  4:39 AM  Result Value Ref Range   Sodium 143 135 - 145 mmol/L   Potassium 3.2 (L) 3.5 - 5.1 mmol/L    Comment: DELTA CHECK NOTED   Chloride 112 (H) 98 - 111 mmol/L   CO2 24 22 - 32 mmol/L   Glucose, Bld 105 (H) 70 - 99 mg/dL    Comment: Glucose reference range applies only to samples taken after fasting for at least 8 hours.   BUN 7 6 - 20 mg/dL   Creatinine, Ser 0.77 0.44 - 1.00 mg/dL   Calcium 8.5 (L) 8.9 - 10.3 mg/dL   GFR calc non Af Amer >60 >60 mL/min   GFR calc Af Amer >60 >60 mL/min   Anion gap 7 5 - 15    Comment: Performed at [facility], [place] [address] [place], [place] [address]  Glucose, capillary     Status: None   Collection Time: 05/15/19  6:22 AM  Result Value Ref Range   Glucose-Capillary 94 70 - 99 mg/dL    Comment: Glucose reference range applies only to samples taken after fasting for at least 8 hours.  Basic metabolic panel     Status: Abnormal   Collection Time: 05/16/19  3:51 AM  Result Value Ref Range   Sodium 142 135 - 145 mmol/L   Potassium 3.3 (L) 3.5 - 5.1 mmol/L   Chloride 108 98 - 111 mmol/L   CO2 23 22 - 32 mmol/L   Glucose, Bld 83 70 - 99 mg/dL    Comment: Glucose reference range applies only to samples taken after fasting for at least 8 hours.   BUN 9 6 - 20 mg/dL   Creatinine, Ser 0.81 0.44 - 1.00 mg/dL   Calcium 9.1 8.9 - 10.3 mg/dL   GFR calc non Af Amer >60 >60 mL/min   GFR calc Af Amer >60 >60 mL/min   Anion gap 11 5 - 15    Comment: Performed at [facility], [place] [address] [place], [place] [address]  Magnesium     Status: None   Collection Time: 05/16/19  3:51 AM  Result Value Ref Range   Magnesium 1.9 1.7 - 2.4 mg/dL    Comment: Performed at [facility], [place] [address] [place],  [address]  CBC     Status: Abnormal   Collection Time: 05/16/19  3:59 AM  Result Value Ref Range   WBC 7.3 4.0 - 10.5 K/uL   RBC  4.06 3.87 - 5.11 MIL/uL   Hemoglobin 11.9 (L) 12.0 - 15.0 g/dL   HCT 35.3 (L) 36.0 - 46.0 %   MCV 86.9 80.0 - 100.0 fL   MCH 29.3 26.0 - 34.0 pg   MCHC 33.7 30.0 - 36.0 g/dL   RDW 12.4 11.5 - 15.5 %   Platelets 163 150 - 400 K/uL  nRBC 0.0 0.0 - 0.2 %    Comment: Performed at [facility], [place] [address] [place], [place] [address]  Basic metabolic panel     Status: None   Collection Time: 05/17/19  8:33 AM  Result Value Ref Range   Sodium 141 135 - 145 mmol/L   Potassium 3.7 3.5 - 5.1 mmol/L   Chloride 108 98 - 111 mmol/L   CO2 22 22 - 32 mmol/L   Glucose, Bld 98 70 - 99 mg/dL    Comment: Glucose reference range applies only to samples taken after fasting for at least 8 hours.   BUN 9 6 - 20 mg/dL   Creatinine, Ser 0.84 0.44 - 1.00 mg/dL   Calcium 9.2 8.9 - 10.3 mg/dL   GFR calc non Af Amer >60 >60 mL/min   GFR calc Af Amer >60 >60 mL/min   Anion gap 11 5 - 15    Comment: Performed at [facility], [place] [address] [place], [place] [address]  Surgical pathology     Status: None   Collection Time: 05/17/19 12:07 PM  Result Value Ref Range   SURGICAL PATHOLOGY      SURGICAL PATHOLOGY CASE: [id number] PATIENT: China Oestreich Surgical Pathology Report     Clinical History: Abnormal CT; gastritis, distal esophageal stricture (cm)     FINAL MICROSCOPIC DIAGNOSIS:  A. STOMACH, BIOPSY: - Mild reactive gastropathy. - Warthin-Starry is negative for Helicobacter pylori. - No intestinal metaplasia, dysplasia, or malignancy.  GROSS DESCRIPTION:  Received in formalin are tan, soft tissue fragments that are submitted in toto. Number: Multiple fragments size: Average 0.1 cm blocks: 1 (AK 05/17/2019)    Final Diagnosis performed by Vicente Males, MD.   Electronically signed 05/18/2019 Technical and / or Professional components performed at [facility], [place] [address], [place], [place] [address].  Immunohistochemistry  Technical component (if applicable) was performed at [facility]. [address] [place], [place], [place] [address].   IMMUNOHISTOCHEMISTRY DISCLAIMER (if applicable): Some of  these immunohistochemical stains may have been developed and the performance characteristics determine by [facility]. Some may not have been cleared or approved by the U.S. Food and Drug Administration. The FDA has determined that such clearance or approval is not necessary. This test is used for clinical purposes. It should not be regarded as investigational or for research. This laboratory is certified under the [place] (CLIA-88) as qualified to perform high complexity clinical laboratory testing.  The controls stained appropriately.   CBC     Status: None   Collection Time: 05/18/19  3:38 AM  Result Value Ref Range   WBC 6.4 4.0 - 10.5 K/uL   RBC 4.14 3.87 - 5.11 MIL/uL   Hemoglobin 12.1 12.0 - 15.0 g/dL   HCT 37.0 36.0 - 46.0 %   MCV 89.4 80.0 - 100.0 fL   MCH 29.2 26.0 - 34.0 pg   MCHC 32.7 30.0 - 36.0 g/dL   RDW 12.8 11.5 - 15.5 %   Platelets 192 150 - 400 K/uL   nRBC 0.0 0.0 - 0.2 %    Comment: Performed at [facility], [place] [address], [place],  [address]  Basic metabolic panel     Status: Abnormal   Collection Time: 05/18/19  3:38 AM  Result Value Ref Range   Sodium 141 135 - 145 mmol/L   Potassium 3.5 3.5 - 5.1 mmol/L   Chloride 111 98 -  111 mmol/L   CO2 21 (L) 22 - 32 mmol/L   Glucose, Bld 101 (H) 70 - 99 mg/dL    Comment: Glucose reference range applies only to samples taken after fasting for at least 8 hours.   BUN 12 6 - 20 mg/dL   Creatinine, Ser 0.91 0.44 - 1.00 mg/dL   Calcium 8.8 (L) 8.9 - 10.3 mg/dL   GFR calc non Af Amer >60 >60 mL/min   GFR calc Af Amer >60 >60 mL/min   Anion gap 9 5 - 15    Comment: Performed at [facility], [place] [address] [place],  [place] [address]    Assessment/Plan: 1. Feeling of chest tightness Examination today unremarkable.  Episodes happening few times throughout the day.  Nonexertional.  Lasting less than 1 minute.  Not associated with chest pain, lightheadedness, dizziness, sweating or nausea.  Patient with history of both asthma and severe GERD.  Question potential for esophageal spasm.  Will add on daily OTC famotidine 20 mg to her regimen of Protonix.  Dietary recommendations reviewed.  Encouraged her to restart her inhaler as directed.  She is now being scheduled for her repeat CT of chest which will further assess other causes of symptoms.  Will alter regimen based on imaging findings and response to change in regimen.  Strict ER precautions reviewed with patient and husband.  This visit occurred during the SARS-CoV-2 public health emergency.  Safety protocols were in place, including screening questions prior to the visit, additional usage of staff PPE, and extensive cleaning of exam room while observing appropriate contact time as indicated for disinfecting solutions.     Leeanne Rio, PA-C

## 2019-07-04 NOTE — Patient Instructions (Addendum)
Please speak with Erica up front to schedule CT scan.  Your lungs sound clear today. I am almost wondering if you are having some spasms in the esophagus. Make sure you are taking your Protonix daily as directed. Start a daily Famotidine (20 mg) in the morning over the next week to see if this helps   The imaging will help [place] further rule out any worrisome cause of symptoms. Based on results we will determine next steps.   Hang in there!

## 2019-07-08 ENCOUNTER — Ambulatory Visit: Payer: Medicare Other | Admitting: Gastroenterology

## 2019-11-09 ENCOUNTER — Ambulatory Visit (INDEPENDENT_AMBULATORY_CARE_PROVIDER_SITE_OTHER): Payer: Medicare Other | Admitting: Physician Assistant

## 2019-11-09 ENCOUNTER — Encounter: Payer: Self-pay | Admitting: Physician Assistant

## 2019-11-09 ENCOUNTER — Other Ambulatory Visit: Payer: Self-pay

## 2019-11-09 DIAGNOSIS — Z79899 Other long term (current) drug therapy: Secondary | ICD-10-CM | POA: Diagnosis not present

## 2019-11-09 DIAGNOSIS — F250 Schizoaffective disorder, bipolar type: Secondary | ICD-10-CM

## 2019-11-09 DIAGNOSIS — F5105 Insomnia due to other mental disorder: Secondary | ICD-10-CM | POA: Diagnosis not present

## 2019-11-09 DIAGNOSIS — F411 Generalized anxiety disorder: Secondary | ICD-10-CM | POA: Diagnosis not present

## 2019-11-09 DIAGNOSIS — F99 Mental disorder, not otherwise specified: Secondary | ICD-10-CM

## 2019-11-09 MED ORDER — TRAZODONE HCL 100 MG PO TABS: ORAL_TABLET | ORAL | 0 refills | Status: DC

## 2019-11-09 MED ORDER — LITHIUM CARBONATE 300 MG PO CAPS: ORAL_CAPSULE | ORAL | 0 refills | Status: DC

## 2019-11-09 MED ORDER — ZOLPIDEM TARTRATE 10 MG PO TABS: ORAL_TABLET | ORAL | 0 refills | Status: DC

## 2019-11-09 MED ORDER — QUETIAPINE FUMARATE 400 MG PO TABS: ORAL_TABLET | ORAL | 0 refills | Status: DC

## 2019-11-09 NOTE — Progress Notes (Signed)
[facility]  Patient ID: [name],  MRN: [id number]  PCP: Brunetta Jeans, PA-C  Date of Evaluation: 11/09/2019 Time spent:30 minutes  Chief Complaint:  Chief Complaint    Follow-up       HISTORY/CURRENT STATUS: HPI For routine med check.  [name] states she is doing well for the most part.  She does have chronic back pain and has had several failed injections in the lumbar area.  She is seeing a physician in [place] and the plan from here on out is to do a nerve block.  She is very hopeful that will help.  She sees pain management at [facility].  One of her daughters is getting married in September.  She had her husband plan to go to [place] sometime after that and stay for several weeks or maybe live there part-time and then move back to the states.  One of her daughters is in a rough situation where her husband does not work and their kids she feels are being neglected.  She may be helping with them.  She has called and had welfare checks done a couple of times.  That just infuriates the son-in-law.  That is a tough situation.  She and her husband may have to move back here to raise their grandchildren.  Patient states she is trying not to worry about it.  Patient denies loss of interest in usual activities and is able to enjoy things.  Denies decreased energy or motivation.  Appetite has not changed.  No extreme sadness, tearfulness, or feelings of hopelessness.  Denies any changes in concentration, making decisions or remembering things.  Not having a lot of anxiety.  She sleeps well with the trazodone and the Ambien.  Denies suicidal or homicidal thoughts.  Patient denies increased energy with decreased need for sleep, no increased talkativeness, no racing thoughts, no impulsivity or risky behaviors, no increased spending, no increased libido, no grandiosity, no increased irritability or anger, no paranoia and no hallucinations.  Denies dizziness, syncope,  seizures, numbness, tingling, tremor, tics, unsteady gait, slurred speech, confusion. Denies muscle or joint pain, stiffness, or dystonia.  Denies polyuria, Polyphagia, and polydipsia.  No unexplained weight loss.  Denies sores that do not heal.  Individual Medical History/ Review of Systems: Changes? :Yes  See HPI.  Past medications for mental health diagnoses include: Depakote, Tegretol, BuSpar, lithium, Seroquel, trazodone, Ambien  Allergies: Patient has no known allergies.  Current Medications:  Current Outpatient Medications:    albuterol (VENTOLIN HFA) 108 (90 Base) MCG/ACT inhaler, INHALE 1 TO 2 PUFFS INTO THE LUNGS EVERY 6 HOURS AS NEEDED FOR WHEEZING OR SHORTNESS OF BREATH, Disp: 18 g, Rfl: 3   cyclobenzaprine (FLEXERIL) 10 MG tablet, Take 10 mg by mouth at bedtime as needed for muscle spasms. , Disp: , Rfl:    lithium carbonate 300 MG capsule, TAKE 2 CAPSULES(600 MG) BY MOUTH AT BEDTIME, Disp: 180 capsule, Rfl: 0   pantoprazole (PROTONIX) 40 MG tablet, Take 1 tablet (40 mg total) by mouth daily at 6 (six) AM., Disp: 45 tablet, Rfl: 1   pregabalin (LYRICA) 75 MG capsule, Take 75 mg by mouth in the morning and at bedtime., Disp: , Rfl:    QUEtiapine (SEROQUEL) 400 MG tablet, TAKE 2 TABLETS(800 MG) BY MOUTH AT BEDTIME FOR MOOD, Disp: 180 tablet, Rfl: 0   traMADol (ULTRAM) 50 MG tablet, Take by mouth., Disp: , Rfl:    traZODone (DESYREL) 100 MG tablet, TAKE 2 TABLETS(200 MG) BY  MOUTH AT BEDTIME, Disp: 180 tablet, Rfl: 0   zolpidem (AMBIEN) 10 MG tablet, TAKE 1 TABLET(10 MG) BY MOUTH AT BEDTIME FOR SLEEP, Disp: 90 tablet, Rfl: 0 Medication Side Effects: none  Family Medical/ Social History: Changes? No  MENTAL HEALTH EXAM:  There were no vitals taken for this visit.There is no height or weight on file to calculate BMI.  General Appearance: Casual, Neat, Well Groomed and Obese  Eye Contact:  Good  Speech:  Clear and Coherent and Normal Rate  Volume:  Normal  Mood:   Euthymic  Affect:  Appropriate  Thought Process:  Goal Directed and Descriptions of Associations: Intact  Orientation:  Full (Time, Place, and Person)  Thought Content: Logical   Suicidal Thoughts:  No  Homicidal Thoughts:  No  Memory:  WNL  Judgement:  Good  Insight:  Good  Psychomotor Activity:  Normal  Concentration:  Concentration: Good  Recall:  Good  Fund of Knowledge: Good  Language: Good  Assets:  Desire for Improvement  ADL's:  Intact  Cognition: WNL  Prognosis:  Good   See lab results 03/29/2018  DIAGNOSES:    ICD-10-CM   1. Schizoaffective disorder, bipolar type ([place])  F25.0   2. Generalized anxiety disorder  F41.1   3. Insomnia due to other mental disorder  F51.05    F99   4. Encounter for long-term (current) use of medications  Z79.899 Lithium level    Comprehensive metabolic panel    TSH    Hemoglobin A1c    Lipid panel    Receiving Psychotherapy: No     RECOMMENDATIONS:  PDMP was reviewed. I provided 30 minutes of face-to-face time during this encounter.  We discussed her need for labs.  I do not see a lithium level on her chart for several years.  The importance of that was stressed.  She understands that there can be toxicity, kidney problems, or thyroid issues.  I will give her the prescriptions now for only 17-month supply with no refills.  She must have those labs drawn before I will send any further refills.  She understands. Continue lithium 300 mg, 2 p.o. nightly. Continue Seroquel 400 mg, 2 p.o. nightly. Continue trazodone 100 mg, 2 p.o. nightly as needed. Continue Ambien 10 mg 1 nightly as needed sleep. Draw CMP, hemoglobin A1c, TSH, lipid panel, lithium level, all fasting. (While patient was in the office, I forgot to order the lipid panel.  I called the patient and told her we would mail that order to her.  She verbalizes understanding.)   Return in 6 months.  Donnal Moat, PA-C

## 2019-11-12 DIAGNOSIS — M5136 Other intervertebral disc degeneration, lumbar region: Secondary | ICD-10-CM | POA: Insufficient documentation

## 2020-01-18 ENCOUNTER — Ambulatory Visit: Payer: PRIVATE HEALTH INSURANCE

## 2020-02-08 ENCOUNTER — Other Ambulatory Visit: Payer: Self-pay | Admitting: Physician Assistant

## 2020-02-10 LAB — COMPREHENSIVE METABOLIC PANEL
BUN: 20 mg/dL (ref 6–24)
Bilirubin Total: <0.2 mg/dL (ref 0.0–1.2)

## 2020-02-10 LAB — LITHIUM LEVEL: Lithium Lvl: 1.1 mmol/L (ref 0.5–1.2)

## 2020-02-10 LAB — HEMOGLOBIN A1C
Est. average glucose Bld gHb Est-mCnc: 100 mg/dL
Hgb A1c MFr Bld: 5.1 % (ref 4.8–5.6)

## 2020-02-10 LAB — TSH: TSH: 25.4 u[IU]/mL — ABNORMAL HIGH (ref 0.450–4.500)

## 2020-02-15 ENCOUNTER — Telehealth (INDEPENDENT_AMBULATORY_CARE_PROVIDER_SITE_OTHER): Payer: PRIVATE HEALTH INSURANCE | Admitting: Neurological Surgery

## 2020-02-15 ENCOUNTER — Telehealth (INDEPENDENT_AMBULATORY_CARE_PROVIDER_SITE_OTHER): Payer: Self-pay

## 2020-02-15 ENCOUNTER — Telehealth: Payer: Self-pay | Admitting: Physician Assistant

## 2020-02-15 NOTE — Telephone Encounter (Signed)
[facility] called to report that they were unable to process the CMP because that didn't have enough specimen to do it.  If you still want that lab done, they will need a new order and the patient will need to return to get the lab done.

## 2020-02-15 NOTE — Progress Notes (Signed)
Li level is 1.1  A1C 5.1.  Both are good.  No med changes.

## 2020-02-15 NOTE — Telephone Encounter (Signed)
Noted.  I reviewed labs from about 6 months ago, so since they were unable to run the test, I won't reorder it.

## 2020-02-15 NOTE — Telephone Encounter (Signed)
Patient has been called 3-4 times without success. Patient has been e-mailed with no reply. Patient [place] of state and cannot have a video appointment. Was unable to relay this information to her due to no answer from patient with phone or e-mail. Patient is also not signed up for my chart despite invitations sent to her.

## 2020-02-17 ENCOUNTER — Telehealth: Payer: Self-pay | Admitting: Physician Assistant

## 2020-02-17 NOTE — Telephone Encounter (Signed)
Patient's TSH of 25 - Needs to do a full TSH and BMP and CBC needs to be checked also - Surgery is on Wednesday -  Please schedule labs for Monday - any questions please call

## 2020-02-20 ENCOUNTER — Encounter: Payer: Self-pay | Admitting: Physician Assistant

## 2020-02-20 ENCOUNTER — Other Ambulatory Visit: Payer: Self-pay

## 2020-02-20 ENCOUNTER — Ambulatory Visit (INDEPENDENT_AMBULATORY_CARE_PROVIDER_SITE_OTHER): Payer: Medicare Other | Admitting: Physician Assistant

## 2020-02-20 ENCOUNTER — Other Ambulatory Visit (INDEPENDENT_AMBULATORY_CARE_PROVIDER_SITE_OTHER): Payer: Medicare Other

## 2020-02-20 VITALS — BP 120/82 | HR 78 | Temp 98.3°F | Resp 16 | Ht 67.0 in | Wt 230.0 lb

## 2020-02-20 DIAGNOSIS — R7989 Other specified abnormal findings of blood chemistry: Secondary | ICD-10-CM | POA: Diagnosis not present

## 2020-02-20 DIAGNOSIS — Z01818 Encounter for other preprocedural examination: Secondary | ICD-10-CM | POA: Diagnosis not present

## 2020-02-20 LAB — CBC WITH DIFFERENTIAL/PLATELET
Basophils Absolute: 0 10*3/uL (ref 0.0–0.1)
Basophils Relative: 0 % (ref 0.0–3.0)
Eosinophils Absolute: 0 10*3/uL (ref 0.0–0.7)
Eosinophils Relative: 0 % (ref 0.0–5.0)
HCT: 35.7 % — ABNORMAL LOW (ref 36.0–46.0)
Hemoglobin: 12.2 g/dL (ref 12.0–15.0)
Lymphocytes Relative: 44.8 % (ref 12.0–46.0)
Lymphs Abs: 1.9 10*3/uL (ref 0.7–4.0)
MCHC: 34.3 g/dL (ref 30.0–36.0)
MCV: 88.8 fl (ref 78.0–100.0)
Monocytes Absolute: 0.3 10*3/uL (ref 0.1–1.0)
Monocytes Relative: 7.1 % (ref 3.0–12.0)
Neutro Abs: 2 10*3/uL (ref 1.4–7.7)
Neutrophils Relative %: 48.1 % (ref 43.0–77.0)
Platelets: 151 10*3/uL (ref 150.0–400.0)
RBC: 4.02 Mil/uL (ref 3.87–5.11)
RDW: 12.9 % (ref 11.5–15.5)
WBC: 4.2 10*3/uL (ref 4.0–10.5)

## 2020-02-20 LAB — BASIC METABOLIC PANEL
BUN: 13 mg/dL (ref 6–23)
CO2: 25 mEq/L (ref 19–32)
Calcium: 9.4 mg/dL (ref 8.4–10.5)
Chloride: 107 mEq/L (ref 96–112)
Creatinine, Ser: 0.97 mg/dL (ref 0.40–1.20)
GFR: 66.52 mL/min (ref >60.00)
Glucose, Bld: 117 mg/dL — ABNORMAL HIGH (ref 70–99)
Potassium: 3.7 mEq/L (ref 3.5–5.1)
Sodium: 141 mEq/L (ref 135–145)

## 2020-02-20 NOTE — Patient Instructions (Signed)
Please go to the lab today for blood work.  I will call you with your results. We will alter treatment regimen(s) if indicated by your results.   If free thyroid hormones are abnormal we will be starting a medication.  If normal we may want to repeat TSH and free hormone levels in 4 weeks.   Good luck with your procedure.

## 2020-02-20 NOTE — Progress Notes (Signed)
Patient presents to clinic today c/o recent abnormal TSH level drawn by her psychiatrist.  Was instructed to follow-up with primary care provider for further evaluation.  This is been routinely checked by her psychiatrist as she is on a regimen of lithium.  Notes she is taking her lithium as directed and noticed great control over her bipolar symptoms.  Patient also scheduled this Wednesday for a left laminectomy at [facility].  Was instructed by her surgeon to have a repeat CBC and BMP drawn prior to surgery.  States no surgical clearance is needed as she has already had this with anesthesiology and the neurosurgeon.  Past Medical History:  Diagnosis Date  . Anxiety   . Bipolar affective disorder, mixed, in partial or remission   . Depression   . Dysrhythmia    Left sided heart diease  . Headache(784.0)   . History of gallstones   . Kidney stones   . Mental disorder   . Migraine   . Pneumonia    Hx: of as a child  . PONV (postoperative nausea and vomiting)   . Renal disorder   . Sleep apnea    sleep test 2014, did not have osa  . 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 albuterol (VENTOLIN HFA) 108 (90 Base) MCG/ACT inhaler INHALE 1 TO 2 PUFFS INTO THE LUNGS EVERY 6 HOURS AS NEEDED FOR WHEEZING OR SHORTNESS OF BREATH 18 g 3  . cyclobenzaprine (FLEXERIL) 10 MG tablet Take 10 mg by mouth at bedtime as needed for muscle spasms.     Marland Kitchen lithium carbonate 300 MG capsule TAKE 2 CAPSULES(600 MG) BY MOUTH AT BEDTIME 180 capsule 0  . pantoprazole (PROTONIX) 40 MG tablet Take 1 tablet (40 mg total) by mouth daily at 6 (six) AM. 45 tablet 1  . pregabalin (LYRICA) 100 MG capsule Take 100 mg by mouth 2 (two) times daily.    . QUEtiapine (SEROQUEL) 400 MG tablet TAKE 2 TABLETS(800 MG) BY MOUTH AT BEDTIME FOR MOOD 180 tablet 0  . traMADol (ULTRAM) 50 MG tablet Take 100 mg by mouth  every 8 (eight) hours as needed.    . traZODone (DESYREL) 100 MG tablet TAKE 2 TABLETS(200 MG) BY MOUTH AT BEDTIME 180 tablet 0  . zolpidem (AMBIEN) 10 MG tablet TAKE 1 TABLET(10 MG) BY MOUTH AT BEDTIME FOR SLEEP 90 tablet 0   No current facility-administered medications on file prior to visit.    No Known Allergies  Family History  Problem Relation Age of Onset  . Colon cancer Father 60  . Breast cancer Father   . Breast cancer Mother   . Uterine cancer Mother   . Stomach cancer Neg Hx   . Rectal cancer Neg Hx   . Esophageal cancer Neg Hx     Social History   Socioeconomic History  . Marital status: Married    Spouse name: Not on file  . Number of children: 6  . Years of education: Not on file  . Highest education level: Not on file  Occupational History  . Occupation: Presenter, broadcasting  Tobacco Use  . Smoking status: Never Smoker  . Smokeless tobacco: Never Used  Vaping Use  . Vaping Use: Never used  Substance and Sexual Activity  . Alcohol use: No    Alcohol/week: 0.0 standard drinks  . Drug use: No  Types: Other-see comments, Fentanyl, Hydrocodone  . Sexual activity: Not Currently    Birth control/protection: Surgical  Other Topics Concern  . 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 Difficulty of Paying Living Expenses: Not on file  Food Insecurity:   . Worried About Charity fundraiser in the Last Year: Not on file  . Ran Out of Food in the Last Year: Not on file  Transportation Needs:   . Lack of Transportation (Medical): Not on file  . Lack of Transportation (Non-Medical): Not on file  Physical Activity:   . Days of  Exercise per Week: Not on file  . Minutes of Exercise per Session: Not on file  Stress:   . Feeling of Stress : Not on file  Social Connections:   . Frequency of Communication with Friends and Family: Not on file  . Frequency of Social Gatherings with Friends and Family: Not on file  . Attends Religious Services: Not on file  . Active Member of Clubs or Organizations: Not on file  . Attends Archivist Meetings: Not on file  . Marital Status: Not on file    Review of Systems - See HPI.  All other ROS are negative.  BP 120/82   Pulse 78   Temp 98.3 F (36.8 C) (Temporal)   Resp 16   Ht _0  (1.702 m)   Wt 230 lb (104.3 kg)   SpO2 96%   BMI 36.02 kg/m   Physical Exam Vitals reviewed.  HENT:     Head: Normocephalic and atraumatic.  Cardiovascular:     Rate and Rhythm: Normal rate and regular rhythm.     Pulses: Normal pulses.     Heart sounds: Normal heart sounds.  Pulmonary:     Effort: Pulmonary effort is normal.     Breath sounds: Normal breath sounds.  Musculoskeletal:     Cervical back: Neck supple.  Neurological:     General: No focal deficit present.     Mental Status: She is oriented to person, place, and time.  Psychiatric:        Mood and Affect: Mood normal.     Recent Results (from the past 2160 hour(s))  Comprehensive metabolic panel     Status: None   Collection Time: 02/08/20  9:57 AM  Result Value Ref Range   Glucose CANCELED mg/dL    Comment: [facility] was unable to collect sufficient specimen to perform the following test(s), and is providing the patient with re-collection instructions.  Result canceled by the ancillary.    BUN 20 6 - 24 mg/dL   Creatinine, Ser CANCELED mg/dL    Comment: [facility] was unable to collect sufficient specimen to perform the following test(s), and is providing the patient with re-collection instructions.  Result canceled by the ancillary.    GFR calc non Af Amer CANCELED mL/min/1.73    Comment: Unable  to calculate result since non-numeric result obtained for component test.  Result canceled by the ancillary.    GFR calc Af Amer CANCELED mL/min/1.73  Comment: Unable to calculate result since non-numeric result obtained for component test. **In accordance with recommendations from the NKF-ASN Task force,**   [facility] is in the process of updating its eGFR calculation to the   2021 CKD-EPI creatinine equation that estimates kidney function   without a race variable.  Result canceled by the ancillary.    BUN/Creatinine Ratio CANCELED     Comment: Unable to calculate result since non-numeric result obtained for component test.  Result canceled by the ancillary.    Sodium CANCELED mmol/L    Comment: [facility] was unable to collect sufficient specimen to perform the following test(s), and is providing the patient with re-collection instructions.  Result canceled by the ancillary.    Potassium CANCELED mmol/L    Comment: [facility] was unable to collect sufficient specimen to perform the following test(s), and is providing the patient with re-collection instructions.  Result canceled by the ancillary.    Chloride CANCELED mmol/L    Comment: [facility] was unable to collect sufficient specimen to perform the following test(s), and is providing the patient with re-collection instructions.  Result canceled by the ancillary.    CO2 CANCELED mmol/L    Comment: [facility] was unable to collect sufficient specimen to perform the following test(s), and is providing the patient with re-collection instructions.  Result canceled by the ancillary.    Calcium CANCELED mg/dL    Comment: [facility] was unable to collect sufficient specimen to perform the following test(s), and is providing the patient with re-collection instructions.  Result canceled by the ancillary.    Total Protein CANCELED g/dL    Comment: [facility] was unable to collect sufficient specimen to perform the following  test(s), and is providing the patient with re-collection instructions.  Result canceled by the ancillary.    Albumin CANCELED g/dL    Comment: [facility] was unable to collect sufficient specimen to perform the following test(s), and is providing the patient with re-collection instructions.  Result canceled by the ancillary.    Globulin, Total CANCELED g/dL    Comment: Unable to calculate result since non-numeric result obtained for component test.  Result canceled by the ancillary.    Albumin/Globulin Ratio CANCELED     Comment: Unable to calculate result since non-numeric result obtained for component test.  Result canceled by the ancillary.    Bilirubin Total <0.2 0.0 - 1.2 mg/dL   Alkaline Phosphatase CANCELED IU/L    Comment: [facility] was unable to collect sufficient specimen to perform the following test(s), and is providing the patient with re-collection instructions.               **Please note reference interval change**  Result canceled by the ancillary.    AST CANCELED IU/L    Comment: [facility] was unable to collect sufficient specimen to perform the following test(s), and is providing the patient with re-collection instructions.  Result canceled by the ancillary.    ALT CANCELED IU/L    Comment: [facility] was unable to collect sufficient specimen to perform the following test(s), and is providing the patient with re-collection instructions.  Result canceled by the ancillary.   Hemoglobin A1c     Status: None   Collection Time: 02/08/20  9:57 AM  Result Value Ref Range   Hgb A1c MFr Bld 5.1 4.8 - 5.6 %    Comment:          Prediabetes: 5.7 - 6.4          Diabetes: >6.4  Glycemic control for adults with diabetes: <7.0    Est. average glucose Bld gHb Est-mCnc 100 mg/dL  TSH     Status: Abnormal   Collection Time: 02/08/20  9:57 AM  Result Value Ref Range   TSH 25.400 (H) 0.450 - 4.500 uIU/mL  Lithium level     Status: None   Collection Time:  02/08/20  9:57 AM  Result Value Ref Range   Lithium Lvl 1.1 0.5 - 1.2 mmol/L    Comment: Plasma concentration of 0.5 - 0.8 mmol/L are advised for long-term use; concentrations of up to 1.2 mmol/L may be necessary during acute treatment.                                  Detection Limit = 0.1                           <0.1 indicates None Detected     Assessment/Plan: 1. Elevated TSH Likely secondary to lithium therapy.  We will repeat thyroid panel including TSH with free T3 and T4.  If remaining abnormal will begin levothyroxine, starting at 50 mcg daily.  If normal will repeat TSH and free thyroid hormones in 4 to 6 weeks.  She has done very well on the lithium and does not stop this.  She is to discuss with her specialist. - Thyroid Panel With TSH  2. Preoperative testing Labs obtained at patient request. - Basic Metabolic Panel (BMET) - Thyroid Panel With TSH - CBC w/Diff  This visit occurred during the SARS-CoV-2 public health emergency.  Safety protocols were in place, including screening questions prior to the visit, additional usage of staff PPE, and extensive cleaning of exam room while observing appropriate contact time as indicated for disinfecting solutions.     Leeanne Rio, PA-C

## 2020-02-20 NOTE — Telephone Encounter (Signed)
Patient has appointment today to discuss

## 2020-02-21 LAB — THYROID PANEL WITH TSH
Free Thyroxine Index: 1.1 — ABNORMAL LOW (ref 1.4–3.8)
T3 Uptake: 31 % (ref 22–35)
T4, Total: 3.6 ug/dL — ABNORMAL LOW (ref 5.1–11.9)
TSH: 18 mIU/L — ABNORMAL HIGH

## 2020-02-21 NOTE — Progress Notes (Signed)
01/18/20 1200   Pain History   Pain Symptoms Pain  (LBP)   Pain Location Lower Back;Leg  Left;Foot/Toe  Left   Pain Description Sharp/Stabbing;Spasms/Cramping;Radiating;Chronic(>3 months)   Pain Frequency Constant   Sensation Symptoms Numbness   Sensation Location Leg  Left   Sensation Frequency Intermittent   Foot Drop No   Incontinence No   Treatments   Have you visited a chiropractor for this problem? No   Have you ever had physical therapy for this problem? Yes   If so, when? Within the last year   Have you ever received an injection for this problem? Yes   If so, when? Within the last year   Diagnostic Tests   MRI Lumbar  (12/2019)   Care Management   How did you hear about our program Internet Search   Have you had any prior spine surgeries? Yes  (2020 Lumbar Laminectomy L4-L5)   [facility] Appointment   [facility] Physician Paulette Blanch   [facility] Appointment Date 02/15/20  (9:00 PM)   [facility] Appointment Location [facility]

## 2020-02-22 ENCOUNTER — Other Ambulatory Visit: Payer: Self-pay

## 2020-02-22 DIAGNOSIS — R7989 Other specified abnormal findings of blood chemistry: Secondary | ICD-10-CM

## 2020-02-22 MED ORDER — LEVOTHYROXINE SODIUM 50 MCG PO TABS: 50.0000 ug | ORAL_TABLET | Freq: Every day | ORAL | 1 refills | Status: DC

## 2020-02-22 NOTE — Progress Notes (Signed)
le

## 2020-03-27 ENCOUNTER — Encounter: Payer: Self-pay | Admitting: Physician Assistant

## 2020-03-27 ENCOUNTER — Other Ambulatory Visit: Payer: Self-pay | Admitting: Physician Assistant

## 2020-03-27 ENCOUNTER — Other Ambulatory Visit: Payer: Self-pay

## 2020-03-27 ENCOUNTER — Telehealth: Payer: Self-pay | Admitting: Physician Assistant

## 2020-03-27 ENCOUNTER — Telehealth (INDEPENDENT_AMBULATORY_CARE_PROVIDER_SITE_OTHER): Payer: Medicare Other | Admitting: Physician Assistant

## 2020-03-27 DIAGNOSIS — Z20822 Contact with and (suspected) exposure to covid-19: Secondary | ICD-10-CM

## 2020-03-27 NOTE — Progress Notes (Signed)
I have discussed the procedure for the virtual visit with the patient who has given consent to proceed with assessment and treatment.   Bailynn Dyk S Sheretha Shadd, CMA     

## 2020-03-27 NOTE — Patient Instructions (Signed)
Instructions sent to patients MyChart.

## 2020-03-27 NOTE — Telephone Encounter (Signed)
Pt requesting refill for Ambien. Apt due 2/25. Left message to call office to schedule f/u. [facility]    Apt 1/18

## 2020-03-27 NOTE — Progress Notes (Signed)
Virtual Visit via Video   I connected with patient on 03/27/20 at 11:30 AM EST by a video enabled telemedicine application and verified that I am speaking with the correct person using two identifiers.  Location patient: Home Location provider: [name] Persons participating in the virtual visit: Patient, Provider, House (Patina Moore)  I discussed the limitations of evaluation and management by telemedicine and the availability of in person appointments. The patient expressed understanding and agreed to proceed.  Subjective:   HPI:   Patient presents via [place] today complaining of 1-1/2 days of nasal congestion with productive cough, fatigue, aches, chills and feeling feverish. States she has not actually checked her temperature. Patient states she occasionally will have some chest tightness with mild wheezing. Denies overt shortness of breath. Denies chest pain. Does note loss of taste and smell. Denies sore throat. States she has multiple people in her house with similar symptoms. Notes she is up-to-date on her COVID-vaccine but cannot give dates. States no one in the house has been tested for COVID. She has not taken anything for symptoms.  ROS:   See pertinent positives and negatives per HPI.  Patient Active Problem List   Diagnosis Date Noted  . Lumbar degenerative disc disease 11/12/2019  . Esophageal thickening 06/06/2019  . Gastritis 05/18/2019  . Esophageal stenosis 05/18/2019  . Community acquired pneumonia of left lower lobe of lung   . Abnormal CT scan, esophagus   . Aspiration pneumonia ([place]) 05/13/2019  . PNA (pneumonia) 05/13/2019  . Achilles tendon contracture, right 04/18/2016  . OAB (overactive bladder) 02/28/2016  . Urgency incontinence 02/28/2016  . Situational anxiety 06/24/2015  . Mass of arm 04/09/2015  . Deviated septum 11/17/2014  . Gastroesophageal reflux disease without esophagitis 09/02/2014  . Chronic pain syndrome  05/24/2014  . Bereavement 03/13/2014  . Bipolar affective disorder, depressed, severe ([place]) 03/11/2014  . Opiate dependence ([place]) 03/11/2014  . Intentional opiate overdose ([place]) 03/11/2014  . De Quervain's tenosynovitis, left 12/12/2013  . Nausea with vomiting 07/28/2013  . Thrombocytopenia, unspecified ([place]) 07/28/2013  . External hemorrhoids 02/16/2013  . Metatarsalgia, right foot 05/18/2011  . Schizoaffective disorder, bipolar type ([place]) 03/31/2011    Social History   Tobacco Use  . Smoking status: Never Smoker  . Smokeless tobacco: Never Used  Substance Use Topics  . Alcohol use: No    Alcohol/week: 0.0 standard drinks    Current Outpatient Medications:  .  albuterol (VENTOLIN HFA) 108 (90 Base) MCG/ACT inhaler, INHALE 1 TO 2 PUFFS INTO THE LUNGS EVERY 6 HOURS AS NEEDED FOR WHEEZING OR SHORTNESS OF BREATH, Disp: 18 g, Rfl: 3 .  cyclobenzaprine (FLEXERIL) 10 MG tablet, Take 10 mg by mouth at bedtime as needed for muscle spasms. , Disp: , Rfl:  .  levothyroxine (SYNTHROID) 50 MCG tablet, Take 1 tablet (50 mcg total) by mouth daily before breakfast., Disp: 30 tablet, Rfl: 1 .  lithium carbonate 300 MG capsule, TAKE 2 CAPSULES(600 MG) BY MOUTH AT BEDTIME, Disp: 180 capsule, Rfl: 0 .  oxyCODONE (OXY IR/ROXICODONE) 5 MG immediate release tablet, Take by mouth., Disp: , Rfl:  .  pantoprazole (PROTONIX) 40 MG tablet, Take 1 tablet (40 mg total) by mouth daily at 6 (six) AM., Disp: 45 tablet, Rfl: 1 .  pregabalin (LYRICA) 100 MG capsule, Take 100 mg by mouth 2 (two) times daily., Disp: , Rfl:  .  QUEtiapine (SEROQUEL) 400 MG tablet, TAKE 2 TABLETS(800 MG) BY MOUTH AT BEDTIME FOR MOOD, Disp: 180 tablet,  Rfl: 0 .  traMADol (ULTRAM) 50 MG tablet, Take 100 mg by mouth every 8 (eight) hours as needed., Disp: , Rfl:  .  traZODone (DESYREL) 100 MG tablet, TAKE 2 TABLETS(200 MG) BY MOUTH AT BEDTIME, Disp: 180 tablet, Rfl: 0 .  zolpidem (AMBIEN) 10 MG tablet, TAKE 1 TABLET(10 MG) BY MOUTH AT  BEDTIME FOR SLEEP, Disp: 90 tablet, Rfl: 0  No Known Allergies  Objective:   There were no vitals taken for this visit.  Patient is well-developed, well-nourished in no acute distress.  Resting comfortably at home.  Head is normocephalic, atraumatic.  No labored breathing.  Speech is clear and coherent with logical content.  Patient is alert and oriented at baseline.   Assessment and Plan:   1. Suspected COVID-19 virus infection Patient with classic symptoms of loss of taste and smell and abrupt onset of other URI symptoms. Multiple sick contacts within the home. No one has been COVID tested. Recommend she be tested ASAP. Resources given to patient. She is to quarantine until results are in the neck steps are determined. She is to keep well-hydrated and get plenty of rest. Vitamin regimen and OTC medications reviewed with patient. Strict ER precautions discussed. Patient enrolled in [facility] monitoring program to keep a daily check on her symptoms. Encouraged her to have her family members tested and to quarantine until the results are in as well to help prevent spread of infection.    Leeanne Rio, PA-C 03/27/2020

## 2020-04-03 ENCOUNTER — Ambulatory Visit: Payer: Medicare Other | Admitting: Physician Assistant

## 2020-04-04 ENCOUNTER — Telehealth: Payer: Self-pay

## 2020-04-04 NOTE — Telephone Encounter (Signed)
Patient states she tested for COVID at [facility] on 1/10.  States this test came back positive.   Patient also had a virtual visit with [place] on 1/11.    Patient states she needs to return to work on 1/28.  Her employer is requesting a letter stating she is able to return to work.  Please advise.

## 2020-04-05 NOTE — Telephone Encounter (Signed)
Spoke with patient and she states she is still having symptoms. So she will not be able go back to work until 04/13/20.

## 2020-04-05 NOTE — Telephone Encounter (Signed)
[name] for letter. She is cleared to return max of 10 days from symptom onset so her return to work would be 04/06/2020

## 2020-04-06 NOTE — Telephone Encounter (Signed)
Ok to extend

## 2020-04-27 ENCOUNTER — Other Ambulatory Visit: Payer: Self-pay | Admitting: Physician Assistant

## 2020-05-07 ENCOUNTER — Other Ambulatory Visit: Payer: Self-pay | Admitting: Physician Assistant

## 2020-05-08 ENCOUNTER — Telehealth: Payer: Self-pay | Admitting: Physician Assistant

## 2020-05-08 IMAGING — US US THYROID
1 series · 14 of 25 positions shown · non-contrast
Comparison: Prior thyroid ultrasound 12/13/2014

CLINICAL DATA: Goiter. 52-year-old female with a history of
multinodular goiter.

EXAM:
THYROID ULTRASOUND
TECHNIQUE: Ultrasound examination of the thyroid gland and adjacent soft
tissues was performed.

[Series 1: us thyroid · 0.06mm/px · 39 acquisitions, 14 frames shown]
[im 1/39]
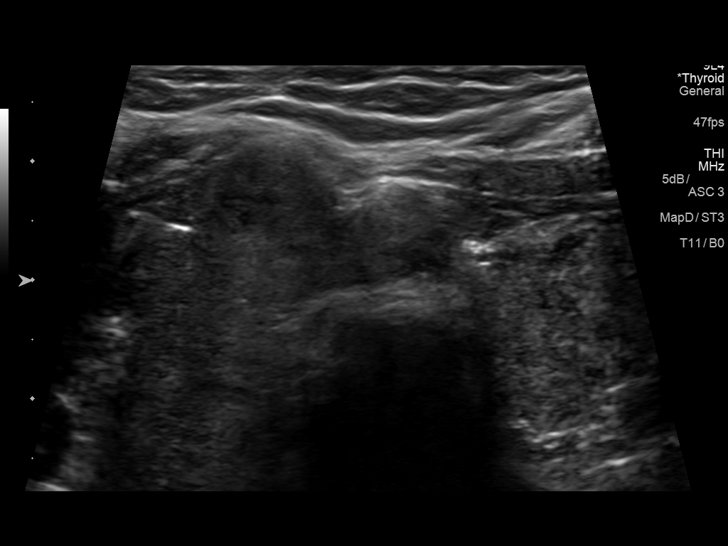
[im 4/39]
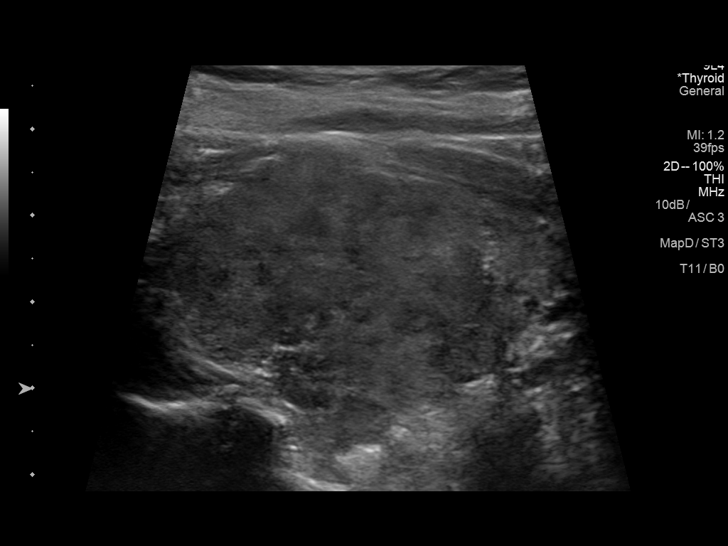
[im 7/39]
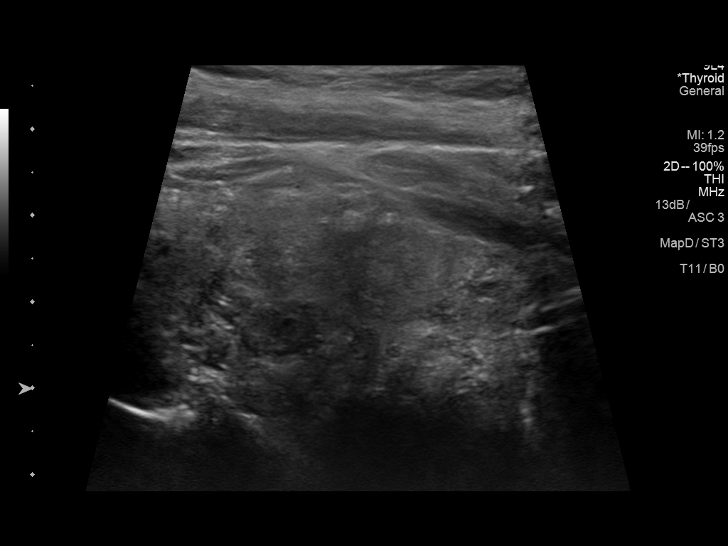
[im 10/39]
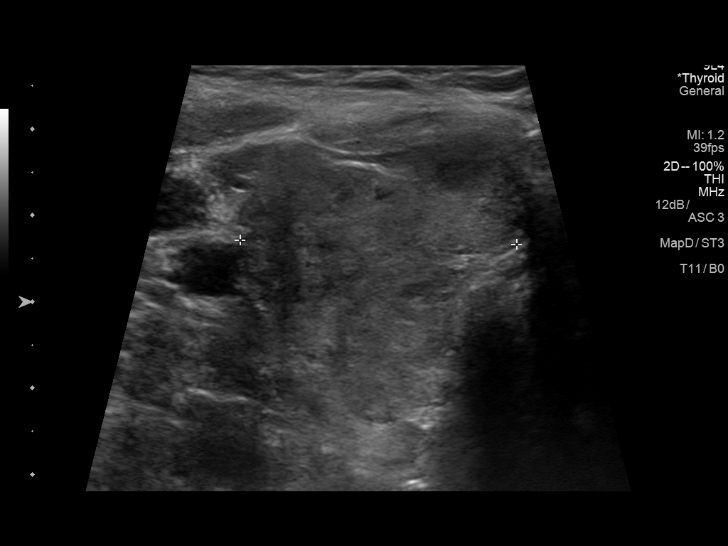
[im 13/39]
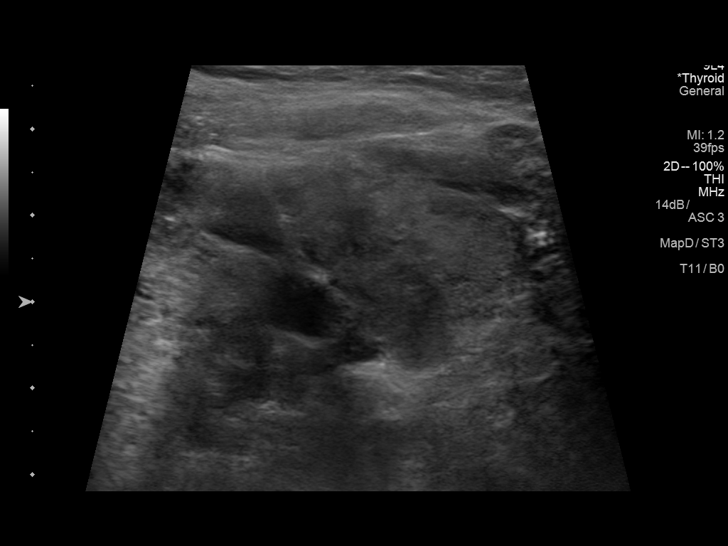
[im 15/39]
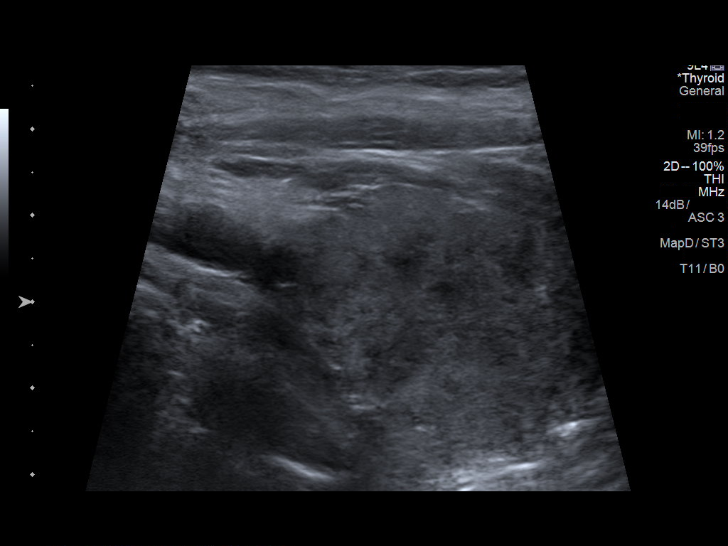
[im 18/39]
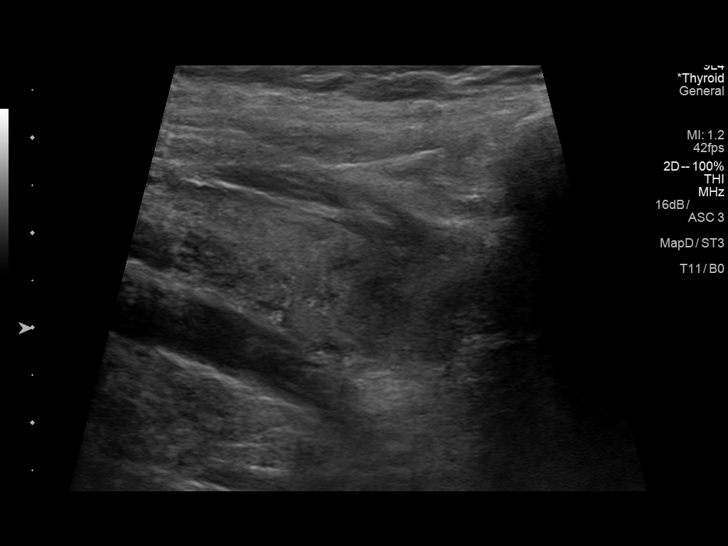
[im 21/39]
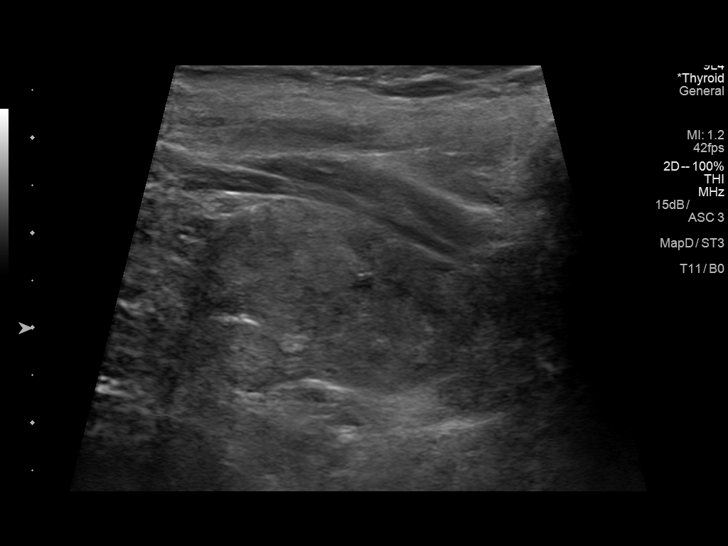
[im 24/39]
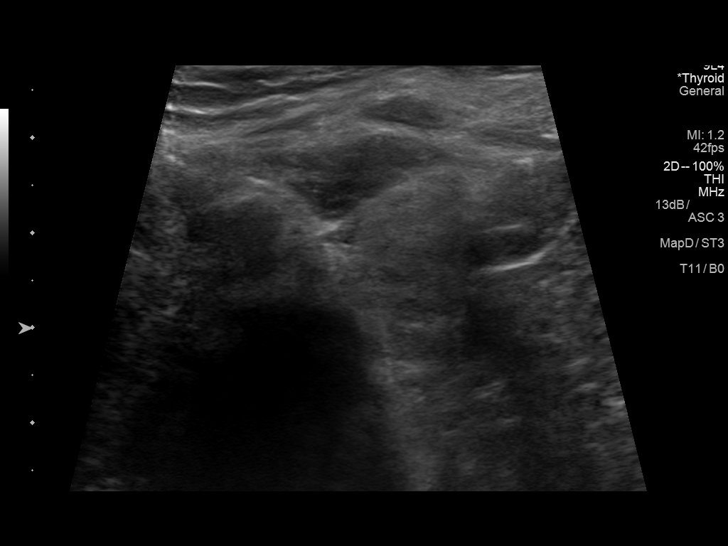
[im 26/39]
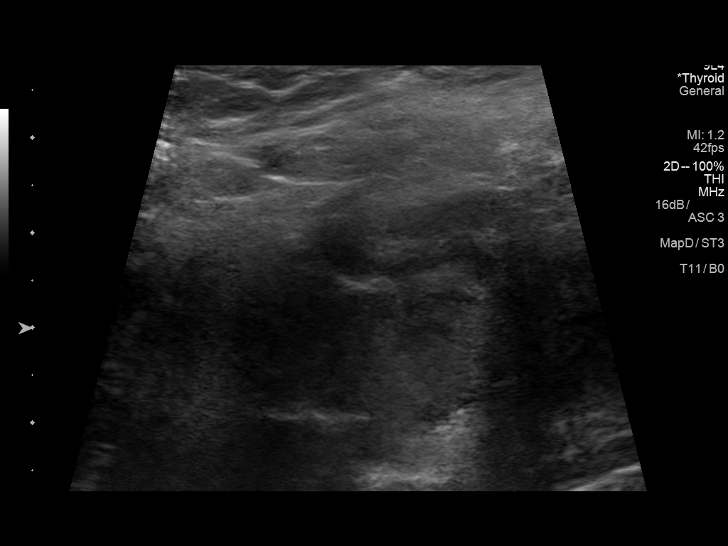
[im 29/39]
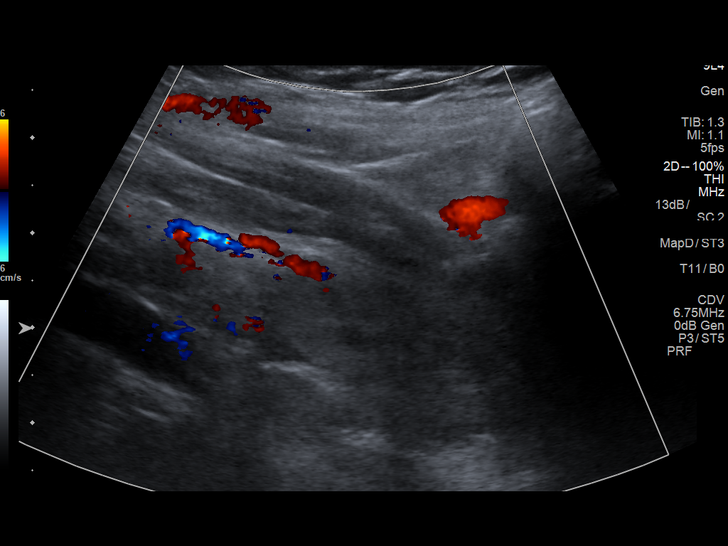
[im 32/39]
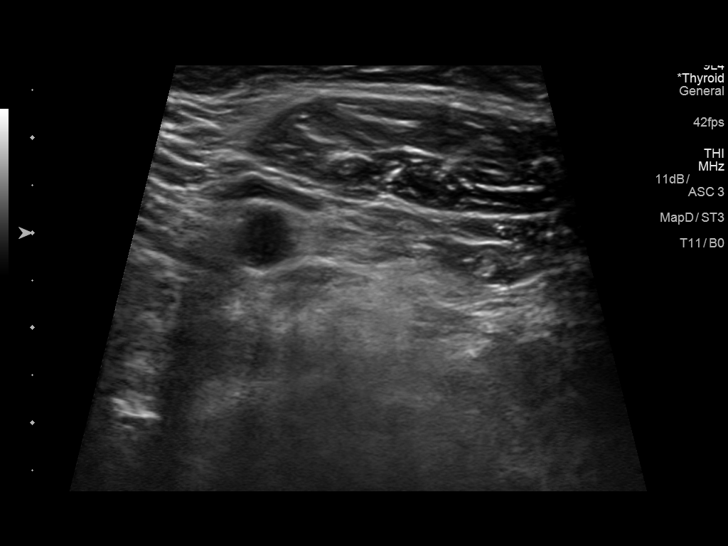
[im 35/39]
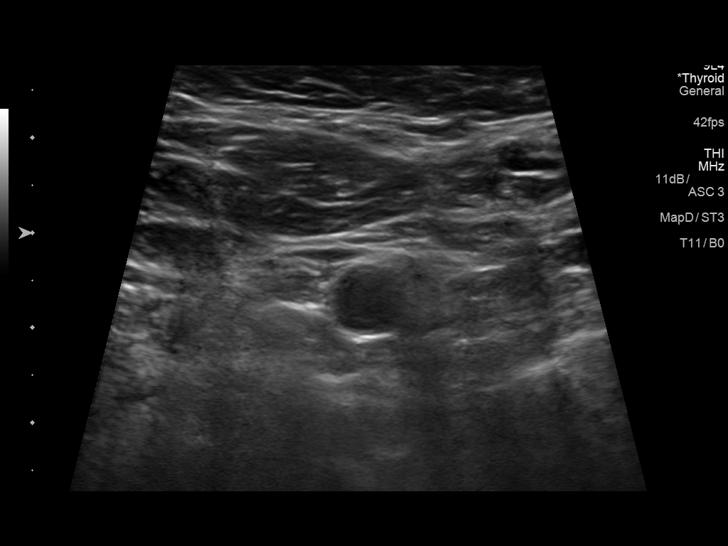
[im 39/39]
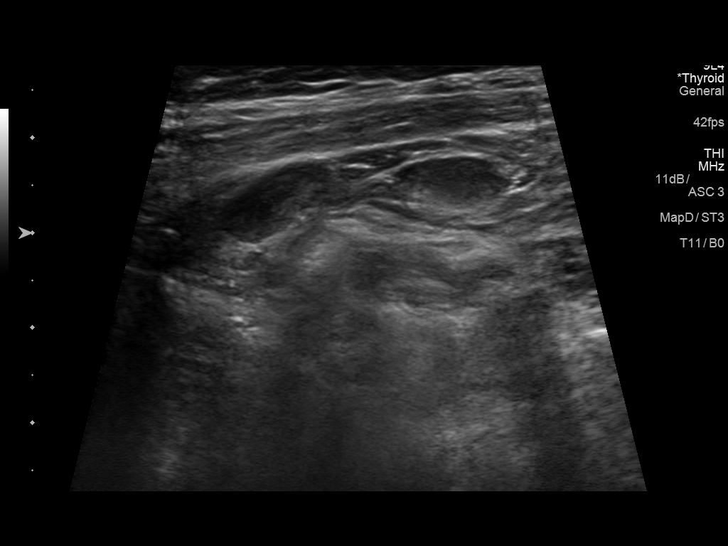

[14 of 25 positions shown; findings below may reference images not displayed]

FINDINGS: Parenchymal Echotexture: Markedly heterogenous

Isthmus: 0.9 cm

Right lobe: 4.7 x 3.1 x 3.2 cm

Left lobe: 3.9 x 2.1 x 1.2 cm

_________________________________________________________

Estimated total number of nodules >/= 1 cm: 0

Number of spongiform nodules >/=  2 cm not described below (TR1): 0

Number of mixed cystic and solid nodules >/= 1.5 cm not described
below (TR2): 0

_________________________________________________________

Diffusely enlarged, heterogeneous and lobular thyroid gland. No
distinct thyroid nodules can be identified separate from the
background thyroid parenchyma.
IMPRESSION: Diffusely heterogeneous, enlarged and lobular thyroid gland without
discrete thyroid nodules.

## 2020-05-08 NOTE — Telephone Encounter (Signed)
Pt would like [place] to send her Seroquel ( just sent 2/22 to [facility]) to [facility] instead. She said the cost of the medicine is lower there. Can we send in 90 day to [facility] on [address], [place]? She has not picked up anything at [facility].

## 2020-05-09 ENCOUNTER — Other Ambulatory Visit: Payer: Self-pay | Admitting: Physician Assistant

## 2020-05-09 MED ORDER — QUETIAPINE FUMARATE 400 MG PO TABS: 800.0000 mg | ORAL_TABLET | Freq: Every day | ORAL | 0 refills | Status: DC

## 2020-05-09 NOTE — Telephone Encounter (Signed)
Prescription was sent to [facility].

## 2020-05-29 ENCOUNTER — Ambulatory Visit (INDEPENDENT_AMBULATORY_CARE_PROVIDER_SITE_OTHER): Payer: Medicare Other | Admitting: Physician Assistant

## 2020-05-29 ENCOUNTER — Other Ambulatory Visit: Payer: Self-pay

## 2020-05-29 ENCOUNTER — Encounter: Payer: Self-pay | Admitting: Physician Assistant

## 2020-05-29 DIAGNOSIS — F411 Generalized anxiety disorder: Secondary | ICD-10-CM | POA: Diagnosis not present

## 2020-05-29 DIAGNOSIS — F5105 Insomnia due to other mental disorder: Secondary | ICD-10-CM

## 2020-05-29 DIAGNOSIS — Z79899 Other long term (current) drug therapy: Secondary | ICD-10-CM

## 2020-05-29 DIAGNOSIS — F99 Mental disorder, not otherwise specified: Secondary | ICD-10-CM

## 2020-05-29 DIAGNOSIS — F250 Schizoaffective disorder, bipolar type: Secondary | ICD-10-CM | POA: Diagnosis not present

## 2020-05-29 NOTE — Progress Notes (Signed)
[facility]  Patient ID: [name],  MRN: [id number]  PCP: Brunetta Jeans, PA-C  Date of Evaluation: 05/29/2020  time spent:30 minutes  Chief Complaint:  Chief Complaint    Anxiety; Depression; Insomnia; Follow-up      HISTORY/CURRENT STATUS: HPI For routine 6 month med check.  Patient denies loss of interest in usual activities and is able to enjoy things, when the back pain isn't so bad.  She and husband live with their two sons and their girlfriends and they play games a lot and enjoy that. Denies decreased energy or motivation.  Appetite has not changed.  No extreme sadness, tearfulness, or feelings of hopelessness.  Denies any changes in concentration, making decisions or remembering things.  She sleeps well with the trazodone and the Ambien. Not really anxious.  Denies suicidal or homicidal thoughts.  Patient denies increased energy with decreased need for sleep, no increased talkativeness, no racing thoughts, no impulsivity or risky behaviors, no increased spending, no increased libido, no grandiosity, no increased irritability or anger, no paranoia and no hallucinations.  Denies dizziness, syncope, seizures, numbness, tingling, tremor, tics, unsteady gait, slurred speech, confusion. Denies muscle or joint pain, stiffness, or dystonia.  Denies polyuria, Polyphagia, and polydipsia.  No unexplained weight loss.  Denies sores that do not heal.  Individual Medical History/ Review of Systems: Changes? :Yes  Had L-spine surgery (decompression)  in Dec, and then another surgery 06/28/2020 for fusion.   Past medications for mental health diagnoses include: Depakote, Tegretol, BuSpar, lithium, Seroquel, trazodone, Ambien  Allergies: Patient has no known allergies.  Current Medications:  Current Outpatient Medications:  .  albuterol (VENTOLIN HFA) 108 (90 Base) MCG/ACT inhaler, INHALE 1 TO 2 PUFFS INTO THE LUNGS EVERY 6 HOURS AS NEEDED FOR WHEEZING OR SHORTNESS OF  BREATH, Disp: 18 g, Rfl: 3 .  cyclobenzaprine (FLEXERIL) 10 MG tablet, Take 10 mg by mouth at bedtime as needed for muscle spasms. , Disp: , Rfl:  .  levothyroxine (SYNTHROID) 50 MCG tablet, Take 1 tablet (50 mcg total) by mouth daily before breakfast., Disp: 30 tablet, Rfl: 1 .  lithium carbonate 300 MG capsule, TAKE 2 CAPSULES(600 MG) BY MOUTH AT BEDTIME, Disp: 180 capsule, Rfl: 0 .  pantoprazole (PROTONIX) 40 MG tablet, Take 1 tablet (40 mg total) by mouth daily at 6 (six) AM., Disp: 45 tablet, Rfl: 1 .  pregabalin (LYRICA) 100 MG capsule, Take 100 mg by mouth 2 (two) times daily., Disp: , Rfl:  .  QUEtiapine (SEROQUEL) 400 MG tablet, Take 2 tablets (800 mg total) by mouth at bedtime., Disp: 180 tablet, Rfl: 0 .  traMADol (ULTRAM) 50 MG tablet, Take 100 mg by mouth every 8 (eight) hours as needed., Disp: , Rfl:  .  traZODone (DESYREL) 100 MG tablet, TAKE 2 TABLETS(200 MG) BY MOUTH AT BEDTIME, Disp: 180 tablet, Rfl: 0 .  zolpidem (AMBIEN) 10 MG tablet, TAKE 1 TABLET(10 MG) BY MOUTH AT BEDTIME FOR SLEEP, Disp: 30 tablet, Rfl: 1 Medication Side Effects: none  Family Medical/ Social History: Changes? No  MENTAL HEALTH EXAM:  There were no vitals taken for this visit.There is no height or weight on file to calculate BMI.  General Appearance: Casual, Neat, Well Groomed and Obese  Eye Contact:  Good  Speech:  Clear and Coherent and Normal Rate  Volume:  Normal  Mood:  Euthymic  Affect:  Appropriate  Thought Process:  Goal Directed and Descriptions of Associations: Intact  Orientation:  Full (Time, Place, and  Person)  Thought Content: Logical   Suicidal Thoughts:  No  Homicidal Thoughts:  No  Memory:  WNL  Judgement:  Good  Insight:  Good  Psychomotor Activity:  Normal  Concentration:  Concentration: Good  Recall:  Good  Fund of Knowledge: Good  Language: Good  Assets:  Desire for Improvement  ADL's:  Intact  Cognition: WNL  Prognosis:  Good   Labs 02/20/2020 CBC nl TSH was  18 BMP normal except glucose 117    DIAGNOSES:    ICD-10-CM   1. Schizoaffective disorder, bipolar type (HCC)  F25.0   2. Generalized anxiety disorder  F41.1   3. Insomnia due to other mental disorder  F51.05    F99   4. Encounter for long-term (current) use of medications  Z79.899 TSH    Comprehensive metabolic panel    CBC with Differential/Platelet    Lithium level    Lipid panel    Hemoglobin A1c    Receiving Psychotherapy: No     RECOMMENDATIONS:  PDMP was reviewed. I provided 30 minutes of face-to-face time during this encounter, including time spent reviewing records and charting. She is doing well with current medications so no changes will be made. Continue lithium 300 mg, 2 p.o. nightly. Continue Seroquel 400 mg, 2 p.o. nightly. Continue trazodone 100 mg, 2 p.o. nightly as needed. Continue Ambien 10 mg 1 nightly as needed sleep. Labs ordered as noted above. Return in 6 months.  Donnal Moat, PA-C

## 2020-06-19 ENCOUNTER — Other Ambulatory Visit: Payer: Self-pay | Admitting: Physician Assistant

## 2020-06-19 NOTE — Telephone Encounter (Signed)
Controlled substance 

## 2020-09-12 ENCOUNTER — Other Ambulatory Visit: Payer: Self-pay | Admitting: Physician Assistant

## 2020-09-12 ENCOUNTER — Telehealth: Payer: Self-pay | Admitting: Physician Assistant

## 2020-09-12 DIAGNOSIS — Z79899 Other long term (current) drug therapy: Secondary | ICD-10-CM

## 2020-09-12 DIAGNOSIS — F250 Schizoaffective disorder, bipolar type: Secondary | ICD-10-CM

## 2020-09-12 NOTE — Telephone Encounter (Signed)
This message

## 2020-09-12 NOTE — Telephone Encounter (Signed)
Pt called and said she wants to pick up her l;ab orders. So can you put them in and then let me know so I can call her

## 2020-09-12 NOTE — Telephone Encounter (Signed)
Please review

## 2020-09-12 NOTE — Telephone Encounter (Signed)
[name] I just ordered those labs so they should be on the printer.  Will you please put a sticky note on it saying she needs to be fasting for at least 8 hours before she gets the labs drawn.  Thanks.

## 2020-09-19 ENCOUNTER — Other Ambulatory Visit: Payer: Self-pay | Admitting: Physician Assistant

## 2020-09-20 ENCOUNTER — Other Ambulatory Visit: Payer: Self-pay | Admitting: Physician Assistant

## 2020-11-27 ENCOUNTER — Ambulatory Visit (INDEPENDENT_AMBULATORY_CARE_PROVIDER_SITE_OTHER): Payer: Medicare (Managed Care) | Admitting: Physician Assistant

## 2020-11-27 ENCOUNTER — Other Ambulatory Visit: Payer: Self-pay

## 2020-11-27 ENCOUNTER — Encounter: Payer: Self-pay | Admitting: Physician Assistant

## 2020-11-27 DIAGNOSIS — Z79899 Other long term (current) drug therapy: Secondary | ICD-10-CM | POA: Diagnosis not present

## 2020-11-27 DIAGNOSIS — F250 Schizoaffective disorder, bipolar type: Secondary | ICD-10-CM

## 2020-11-27 DIAGNOSIS — F411 Generalized anxiety disorder: Secondary | ICD-10-CM | POA: Diagnosis not present

## 2020-11-27 DIAGNOSIS — F5105 Insomnia due to other mental disorder: Secondary | ICD-10-CM

## 2020-11-27 DIAGNOSIS — F99 Mental disorder, not otherwise specified: Secondary | ICD-10-CM

## 2020-11-27 MED ORDER — TRAZODONE HCL 100 MG PO TABS: 100.0000 mg | ORAL_TABLET | Freq: Every evening | ORAL | 0 refills | Status: DC | PRN

## 2020-11-27 MED ORDER — ZOLPIDEM TARTRATE 10 MG PO TABS: ORAL_TABLET | ORAL | 5 refills | Status: DC

## 2020-11-27 MED ORDER — QUETIAPINE FUMARATE 400 MG PO TABS: 800.0000 mg | ORAL_TABLET | Freq: Every day | ORAL | 0 refills | Status: DC

## 2020-11-27 MED ORDER — LITHIUM CARBONATE 300 MG PO CAPS: 600.0000 mg | ORAL_CAPSULE | Freq: Every day | ORAL | 0 refills | Status: DC

## 2020-11-27 NOTE — Progress Notes (Signed)
[facility]  Patient ID: [name],  MRN: [id number]  PCP: Brunetta Jeans, PA-C  Date of Evaluation: 11/27/2020 time spent:40 minutes  Chief Complaint:  Chief Complaint   Follow-up; Depression; Insomnia      HISTORY/CURRENT STATUS: HPI For routine 6 month med check.  States she is doing well.  Feels that the medications are still working well.  She is able to enjoy things.  Energy and motivation are good.  She and her husband are going to [place] next month for a couple of weeks and she is looking forward to that.  Not isolating.  Sleeps well with the Ambien.  No anxiety to speak of.  No suicidal or homicidal thoughts.  Patient denies increased energy with decreased need for sleep, no increased talkativeness, no racing thoughts, no impulsivity or risky behaviors, no increased spending, no increased libido, no grandiosity, no increased irritability or anger, no paranoia, and no hallucinations.  Denies dizziness, syncope, seizures, numbness, tingling, tremor, tics, unsteady gait, slurred speech, confusion. Denies muscle or joint pain, stiffness, or dystonia.  Denies polyuria, Polyphagia, and polydipsia.  No unexplained weight loss.  Denies sores that do not heal.  Individual Medical History/ Review of Systems: Changes? :Yes    had eye surgery since her last visit here.  Also had lumbar spine surgery  Past medications for mental health diagnoses include: Depakote, Tegretol, BuSpar, lithium, Seroquel, trazodone, Ambien  Allergies: Patient has no known allergies.  Current Medications:  Current Outpatient Medications:    albuterol (VENTOLIN HFA) 108 (90 Base) MCG/ACT inhaler, INHALE 1 TO 2 PUFFS INTO THE LUNGS EVERY 6 HOURS AS NEEDED FOR WHEEZING OR SHORTNESS OF BREATH, Disp: 18 g, Rfl: 3   cyclobenzaprine (FLEXERIL) 10 MG tablet, Take 10 mg by mouth at bedtime as needed for muscle spasms. , Disp: , Rfl:    levothyroxine (SYNTHROID) 50 MCG tablet, Take 1 tablet (50  mcg total) by mouth daily before breakfast., Disp: 30 tablet, Rfl: 1   pantoprazole (PROTONIX) 40 MG tablet, Take 1 tablet (40 mg total) by mouth daily at 6 (six) AM., Disp: 45 tablet, Rfl: 1   pregabalin (LYRICA) 100 MG capsule, Take 100 mg by mouth 2 (two) times daily., Disp: , Rfl:    traMADol (ULTRAM) 50 MG tablet, Take 100 mg by mouth every 8 (eight) hours as needed., Disp: , Rfl:    lithium carbonate 300 MG capsule, Take 2 capsules (600 mg total) by mouth at bedtime., Disp: 180 capsule, Rfl: 0   QUEtiapine (SEROQUEL) 400 MG tablet, Take 2 tablets (800 mg total) by mouth at bedtime., Disp: 180 tablet, Rfl: 0   traZODone (DESYREL) 100 MG tablet, Take 1-2 tablets (100-200 mg total) by mouth at bedtime as needed for sleep., Disp: 180 tablet, Rfl: 0   zolpidem (AMBIEN) 10 MG tablet, TAKE ONE TABLET BY MOUTH ONE TIME DAILY AT BEDTIME FOR SLEEP, Disp: 30 tablet, Rfl: 5 Medication Side Effects: none  Family Medical/ Social History: Changes? No  MENTAL HEALTH EXAM:  There were no vitals taken for this visit.There is no height or weight on file to calculate BMI.  General Appearance: Casual, Neat, Well Groomed and Obese  Eye Contact:  Good  Speech:  Clear and Coherent and Normal Rate  Volume:  Normal  Mood:  Euthymic  Affect:  Appropriate  Thought Process:  Goal Directed and Descriptions of Associations: Intact  Orientation:  Full (Time, Place, and Person)  Thought Content: Logical   Suicidal Thoughts:  No  Homicidal Thoughts:  No  Memory:  WNL  Judgement:  Good  Insight:  Good  Psychomotor Activity:  Normal  Concentration:  Concentration: Good  Recall:  Good  Fund of Knowledge: Good  Language: Good  Assets:  Desire for Improvement  ADL's:  Intact  Cognition: WNL  Prognosis:  Good   Labs from April 2022 were reviewed on chart.  These were preop.  DIAGNOSES:    ICD-10-CM   1. Schizoaffective disorder, bipolar type ([facility])  F25.0 Lithium level    Comprehensive metabolic panel     Lipid panel    TSH    CBC with Differential/Platelet    Hemoglobin A1c    2. Encounter for long-term (current) use of medications  Z79.899 Lithium level    Comprehensive metabolic panel    Lipid panel    TSH    CBC with Differential/Platelet    Hemoglobin A1c    3. Insomnia due to other mental disorder  F51.05    F99     4. Generalized anxiety disorder  F41.1        Receiving Psychotherapy: No     RECOMMENDATIONS:  PDMP was reviewed.  Last Ambien filled 11/21/2020.  Lyrica and tramadol filled 11/21/2020 written by another provider. I provided 40 minutes of face to face time during this encounter, including time spent before and after the visit in records review, medical decision making, and charting.  Glad she is doing well.  No changes in meds are necessary. Continue lithium 300 mg, 2 p.o. nightly. Continue Seroquel 400 mg, 2 p.o. nightly. Continue trazodone 100 mg, 2 p.o. nightly as needed. Continue Ambien 10 mg 1 nightly as needed sleep. Labs ordered as noted above.  Stressed the importance of having them drawn. Return in 6 months.  Donnal Moat, PA-C

## 2020-12-18 ENCOUNTER — Other Ambulatory Visit: Payer: Self-pay | Admitting: Physician Assistant

## 2021-01-25 LAB — LIPID PANEL
Chol/HDL Ratio: 3.9 ratio (ref 0.0–4.4)
Cholesterol, Total: 221 mg/dL — ABNORMAL HIGH (ref 100–199)
HDL: 56 mg/dL (ref >39)
LDL Chol Calc (NIH): 140 mg/dL — ABNORMAL HIGH (ref 0–99)
Triglycerides: 142 mg/dL (ref 0–149)
VLDL Cholesterol Cal: 25 mg/dL (ref 5–40)

## 2021-01-25 LAB — COMPREHENSIVE METABOLIC PANEL
ALT: 23 IU/L (ref 0–32)
AST: 17 IU/L (ref 0–40)
Albumin/Globulin Ratio: 2 (ref 1.2–2.2)
Albumin: 4.5 g/dL (ref 3.8–4.9)
Alkaline Phosphatase: 81 IU/L (ref 44–121)
BUN/Creatinine Ratio: 16 (ref 9–23)
BUN: 14 mg/dL (ref 6–24)
Bilirubin Total: 0.3 mg/dL (ref 0.0–1.2)
CO2: 24 mmol/L (ref 20–29)
Calcium: 9.8 mg/dL (ref 8.7–10.2)
Chloride: 106 mmol/L (ref 96–106)
Creatinine, Ser: 0.9 mg/dL (ref 0.57–1.00)
Globulin, Total: 2.2 g/dL (ref 1.5–4.5)
Glucose: 97 mg/dL (ref 70–99)
Potassium: 4.5 mmol/L (ref 3.5–5.2)
Sodium: 142 mmol/L (ref 134–144)
Total Protein: 6.7 g/dL (ref 6.0–8.5)
eGFR: 76 mL/min/{1.73_m2} (ref >59)

## 2021-01-25 LAB — CBC WITH DIFFERENTIAL/PLATELET
Basophils Absolute: 0 10*3/uL (ref 0.0–0.2)
Basos: 0 %
EOS (ABSOLUTE): 0 10*3/uL (ref 0.0–0.4)
Eos: 0 %
Hematocrit: 39.8 % (ref 34.0–46.6)
Hemoglobin: 13 g/dL (ref 11.1–15.9)
Immature Grans (Abs): 0 10*3/uL (ref 0.0–0.1)
Immature Granulocytes: 0 %
Lymphocytes Absolute: 1 10*3/uL (ref 0.7–3.1)
Lymphs: 29 %
MCH: 29 pg (ref 26.6–33.0)
MCHC: 32.7 g/dL (ref 31.5–35.7)
MCV: 89 fL (ref 79–97)
Monocytes Absolute: 0.2 10*3/uL (ref 0.1–0.9)
Monocytes: 5 %
Neutrophils Absolute: 2.3 10*3/uL (ref 1.4–7.0)
Neutrophils: 66 %
Platelets: 144 10*3/uL — ABNORMAL LOW (ref 150–450)
RBC: 4.49 x10E6/uL (ref 3.77–5.28)
RDW: 12.1 % (ref 11.7–15.4)
WBC: 3.5 10*3/uL (ref 3.4–10.8)

## 2021-01-25 LAB — LITHIUM LEVEL: Lithium Lvl: 0.7 mmol/L (ref 0.5–1.2)

## 2021-01-25 LAB — HEMOGLOBIN A1C
Est. average glucose Bld gHb Est-mCnc: 97 mg/dL
Hgb A1c MFr Bld: 5 % (ref 4.8–5.6)

## 2021-01-25 LAB — TSH: TSH: 0.029 u[IU]/mL — ABNORMAL LOW (ref 0.450–4.500)

## 2021-01-25 NOTE — Progress Notes (Signed)
Called patient again. Verified her dose as 50 mcg and she wants it sent to [facility] in [place], which is on her list.

## 2021-01-25 NOTE — Progress Notes (Signed)
Patient notified of results. Her PCP is confirmed as Raiford Noble, PA-C.

## 2021-01-25 NOTE — Progress Notes (Signed)
LVM to RC 

## 2021-01-25 NOTE — Progress Notes (Signed)
I just spoke with him. He is no longer in Primary care, so I will treat the hyperthyroidism.  Please confirm the synthroid dose (Sorry you're having to call twice)  I will lower the dose and she needs another TSH in 6 weeks. We can mail that order to her. Which pharmacy? Thanks.

## 2021-01-25 NOTE — Progress Notes (Signed)
Please let her know the lithium level is in a good range.  She should continue present dose of 600 mg nightly. Kidney function is normal.  Her thyroid test is abnormal.  Please confirm that her PCP treats this.  And it is Raiford Noble, PA-C, I will send a note to him with the lab results.  The Synthroid will need to be adjusted.  Cholesterol is a little bit high, recommend low-fat diet, increase exercise, and weight loss.

## 2021-01-28 ENCOUNTER — Ambulatory Visit: Payer: Medicare (Managed Care) | Admitting: Physician Assistant

## 2021-01-29 ENCOUNTER — Ambulatory Visit (INDEPENDENT_AMBULATORY_CARE_PROVIDER_SITE_OTHER): Payer: Medicare (Managed Care) | Admitting: Orthopedic Surgery

## 2021-01-29 DIAGNOSIS — G8929 Other chronic pain: Secondary | ICD-10-CM

## 2021-01-29 DIAGNOSIS — M545 Low back pain, unspecified: Secondary | ICD-10-CM | POA: Diagnosis not present

## 2021-01-29 DIAGNOSIS — M21371 Foot drop, right foot: Secondary | ICD-10-CM

## 2021-02-15 ENCOUNTER — Encounter: Payer: Self-pay | Admitting: Orthopedic Surgery

## 2021-02-15 NOTE — Progress Notes (Signed)
Office Visit Note   Patient: [name]           Date of Birth: October 22, 1965           MRN: [id number] Visit Date: 01/29/2021              Requested by: No referring provider defined for this encounter. PCP: No primary care provider on file.  Chief Complaint  Patient presents with   Right Foot - Pain   Lower Back - Pain      HPI: Patient is a 55 year old woman who is seen in follow-up for lower back and right foot pain.  Patient states that she had a tendon rupture repaired in [place] in 2018.  Patient is currently following up at [facility] for evaluation for possible SI joint fusion.  She has had an injection.  Patient states that her right foot is getting worse.  She denies any recent injuries.  Assessment & Plan: Visit Diagnoses:  1. Chronic midline low back pain without sciatica   2. Foot drop, right     Plan: A prescription was provided for [facility] for double upright brace extra-depth shoes and orthotic.  Follow-Up Instructions: Return if symptoms worsen or fail to improve.   Ortho Exam  Patient is alert, oriented, no adenopathy, well-dressed, normal affect, normal respiratory effort. Examination patient has a foot drop on the right she is status post great toe amputation which is healed well.  Patient has clawing of her toes.  She has a negative straight leg raise no focal motor weakness.  She has passive dorsiflexion to neutral.  With weightbearing her toes are gripping the floor for balance.  Imaging: No results found. No images are attached to the encounter.  Labs: Lab Results  Component Value Date   HGBA1C 5.0 01/24/2021   HGBA1C 5.1 02/08/2020   HGBA1C 4.8 03/29/2018   REPTSTATUS 03/17/2016 FINAL 03/16/2016   GRAMSTAIN  08/25/2012    RARE WBC PRESENT, PREDOMINANTLY PMN RARE SQUAMOUS EPITHELIAL CELLS PRESENT RARE GRAM POSITIVE COCCI IN PAIRS   CULT <10,000 COLONIES/mL INSIGNIFICANT GROWTH (A) 03/16/2016   LABORGA NO GROWTH 09/14/2013     Lab Results   Component Value Date   ALBUMIN 4.5 01/24/2021   ALBUMIN CANCELED 02/08/2020   ALBUMIN 3.7 05/13/2019    Lab Results  Component Value Date   MG 1.9 05/16/2019   MG 1.8 03/08/2011   MG 2.0 03/06/2011   No results found for: VD25OH  No results found for: PREALBUMIN CBC EXTENDED Latest Ref Rng & Units 01/24/2021 02/20/2020 05/18/2019  WBC 3.4 - 10.8 x10E3/uL 3.5 4.2 6.4  RBC 3.77 - 5.28 x10E6/uL 4.49 4.02 4.14  HGB 11.1 - 15.9 g/dL 13.0 12.2 12.1  HCT 34.0 - 46.6 % 39.8 35.7(L) 37.0  PLT 150 - 450 x10E3/uL 144(L) 151.0 192  NEUTROABS 1.4 - 7.0 x10E3/uL 2.3 2.0 -  LYMPHSABS 0.7 - 3.1 x10E3/uL 1.0 1.9 -     There is no height or weight on file to calculate BMI.  Orders:  No orders of the defined types were placed in this encounter.  No orders of the defined types were placed in this encounter.    Procedures: No procedures performed  Clinical Data: No additional findings.  ROS:  All other systems negative, except as noted in the HPI. Review of Systems  Objective: Vital Signs: There were no vitals taken for this visit.  Specialty Comments:  No specialty comments available.  PMFS History: Patient Active Problem List  Diagnosis Date Noted   Lumbar degenerative disc disease 11/12/2019   Esophageal thickening 06/06/2019   Gastritis 05/18/2019   Esophageal stenosis 05/18/2019   Community acquired pneumonia of left lower lobe of lung    Abnormal CT scan, esophagus    Aspiration pneumonia ([place]) 05/13/2019   PNA (pneumonia) 05/13/2019   Achilles tendon contracture, right 04/18/2016   OAB (overactive bladder) 02/28/2016   Urgency incontinence 02/28/2016   Situational anxiety 06/24/2015   Mass of arm 04/09/2015   Deviated septum 11/17/2014   Gastroesophageal reflux disease without esophagitis 09/02/2014   Chronic pain syndrome 05/24/2014   Bereavement 03/13/2014   Bipolar affective disorder, depressed, severe ([place]) 03/11/2014   Opiate dependence ([place]) 03/11/2014    Intentional opiate overdose ([place]) 03/11/2014   De Quervain's tenosynovitis, left 12/12/2013   Nausea with vomiting 07/28/2013   Thrombocytopenia, unspecified ([place]) 07/28/2013   External hemorrhoids 02/16/2013   Metatarsalgia, right foot 05/18/2011   Schizoaffective disorder, bipolar type ([place]) 03/31/2011   Past Medical History:  Diagnosis Date   Anxiety    Bipolar affective disorder, mixed, in partial or remission    Depression    Dysrhythmia    Left sided heart diease   Headache(784.0)    History of gallstones    Kidney stones    Mental disorder    Migraine    Pneumonia    Hx: of as a child   PONV (postoperative nausea and vomiting)    Renal disorder    Sleep apnea    sleep test 2014, did not have osa   Substance abuse (HCC)    Fentanyl, Percocet. last use 04/2011    Family History  Problem Relation Age of Onset   Colon cancer Father 79   Breast cancer Father    Breast cancer Mother    Uterine cancer Mother    Stomach cancer Neg Hx    Rectal cancer Neg Hx    Esophageal cancer Neg Hx     Past Surgical History:  Procedure Laterality Date   ABDOMINAL HYSTERECTOMY     AMPUTATION Right 09/15/2012   Procedure: AMPUTATION DIGIT- right ;  Surgeon: Newt Minion, MD;  Location: [facility];  Service: Orthopedics;  Laterality: Right;  Right Great Toe Amputation at MTP (metatarsophalangeal)   BIOPSY  05/17/2019   Procedure: BIOPSY;  Surgeon: Ladene Artist, MD;  Location: [facility];  Service: Endoscopy;;   BUNIONECTOMY     11/14/2011   CHOLECYSTECTOMY     COLONOSCOPY     2001, 2008.  Father has colon cancer   ESOPHAGOGASTRODUODENOSCOPY (EGD) WITH PROPOFOL N/A 05/17/2019   Procedure: ESOPHAGOGASTRODUODENOSCOPY (EGD) WITH PROPOFOL;  Surgeon: Ladene Artist, MD;  Location: [facility];  Service: Endoscopy;  Laterality: N/A;   FOOT SURGERY     right x 2   NASAL SEPTOPLASTY W/ TURBINOPLASTY Bilateral 12/04/2014   Procedure: NASAL SEPTOPLASTY WITH BILATERAL TURBINATE REDUCTION;   Surgeon: Izora Gala, MD;  Location: [place];  Service: ENT;  Laterality: Bilateral;   TOTAL ABDOMINAL HYSTERECTOMY W/ BILATERAL SALPINGOOPHORECTOMY     VAGINAL HYSTERECTOMY     Social History   Occupational History   Occupation: Presenter, broadcasting  Tobacco Use   Smoking status: Never   Smokeless tobacco: Never  Vaping Use   Vaping Use: Never used  Substance and Sexual Activity   Alcohol use: No    Alcohol/week: 0.0 standard drinks   Drug use: No    Types: Other-see comments, Fentanyl, Hydrocodone   Sexual activity: Not Currently  Birth control/protection: Surgical

## 2021-03-07 ENCOUNTER — Other Ambulatory Visit: Payer: Self-pay | Admitting: Physician Assistant

## 2021-03-07 NOTE — Telephone Encounter (Signed)
90 day ok?

## 2021-03-14 ENCOUNTER — Ambulatory Visit: Payer: Medicare (Managed Care) | Admitting: Physician Assistant

## 2021-04-27 ENCOUNTER — Telehealth: Payer: Medicare (Managed Care)

## 2021-05-21 ENCOUNTER — Encounter: Payer: Self-pay | Admitting: Physician Assistant

## 2021-05-21 ENCOUNTER — Other Ambulatory Visit: Payer: Self-pay

## 2021-05-21 ENCOUNTER — Ambulatory Visit (INDEPENDENT_AMBULATORY_CARE_PROVIDER_SITE_OTHER): Payer: Medicare (Managed Care) | Admitting: Physician Assistant

## 2021-05-21 DIAGNOSIS — F5105 Insomnia due to other mental disorder: Secondary | ICD-10-CM

## 2021-05-21 DIAGNOSIS — Z79899 Other long term (current) drug therapy: Secondary | ICD-10-CM

## 2021-05-21 DIAGNOSIS — F250 Schizoaffective disorder, bipolar type: Secondary | ICD-10-CM

## 2021-05-21 DIAGNOSIS — F99 Mental disorder, not otherwise specified: Secondary | ICD-10-CM

## 2021-05-21 DIAGNOSIS — E032 Hypothyroidism due to medicaments and other exogenous substances: Secondary | ICD-10-CM

## 2021-05-21 DIAGNOSIS — F411 Generalized anxiety disorder: Secondary | ICD-10-CM | POA: Diagnosis not present

## 2021-05-21 MED ORDER — QUETIAPINE FUMARATE 400 MG PO TABS: 800.0000 mg | ORAL_TABLET | Freq: Every day | ORAL | 1 refills | Status: DC

## 2021-05-21 MED ORDER — TRAZODONE HCL 100 MG PO TABS: ORAL_TABLET | ORAL | 1 refills | Status: DC

## 2021-05-21 MED ORDER — LITHIUM CARBONATE 300 MG PO CAPS: 600.0000 mg | ORAL_CAPSULE | Freq: Every day | ORAL | 1 refills | Status: DC

## 2021-05-21 NOTE — Progress Notes (Signed)
[facility] ? ?Patient ID: [name],  ?MRN: [id number] ? ?PCP: Brunetta Jeans, PA-C ? ?Date of Evaluation: 05/21/2021  ?time spent:40 minutes ? ?Chief Complaint:  ?Chief Complaint   ?Depression; Insomnia; Follow-up ?  ? ? ?HISTORY/CURRENT STATUS: ?HPI For routine 6 month med check. ? ?Patient states she is doing very well.  She is looking forward to her and her husband going to [place] off for a couple of weeks, leaving next week.  They go often on vacation. ? ?Feels that her mental health is good.  She is able to enjoy things.  Energy and motivation are good.  She is not isolating.  ADLs and personal hygiene are normal.  She does not cry easily.  She does not work outside the home.  She sleeps well as long as she has the trazodone and the Ambien.  If she does not take it she cannot fall asleep or stay asleep easily.  She does not feel rested without it.  Not having anxiety to speak of.  No suicidal or homicidal thoughts. ? ?Patient denies increased energy with decreased need for sleep, no increased talkativeness, no racing thoughts, no impulsivity or risky behaviors, no increased spending, no increased libido, no grandiosity, no increased irritability or anger, and no hallucinations. ? ?She had her labs drawn in November.  Her TSH was too low, see note on lab work.  Patient does not know what dose of levothyroxine she is on.  It is unclear from her note whether the dose was changed or not.  She denies tachycardia but does report hot flashes at night.  She does not have ovaries though so not sure if she is menopausal or not.  Unable to tell me how long she has felt the hot flashes.   No unexplained weight loss.  ? ?Review of Systems  ?Constitutional: Negative.   ?HENT: Negative.    ?Eyes: Negative.   ?Respiratory: Negative.    ?Cardiovascular: Negative.   ?Gastrointestinal: Negative.   ?Genitourinary: Negative.   ?Musculoskeletal: Negative.   ?Skin: Negative.   ?Neurological: Negative.    ?Endo/Heme/Allergies: Negative.   ?Psychiatric/Behavioral:    ?     See HPI.  ? ?Individual Medical History/ Review of Systems: Changes? :Yes    had abnl mammogram, MRI, [place], bx was normal, she's going to [facility] soon, wants a prophylactic mastectomy. Mother and father both had Breast cancer, so did her GM ? ?Past medications for mental health diagnoses include: ?Depakote, Tegretol, BuSpar, lithium, Seroquel, trazodone, Ambien ? ?Allergies: Patient has no known allergies. ? ?Current Medications:  ?Current Outpatient Medications:  ?  albuterol (VENTOLIN HFA) 108 (90 Base) MCG/ACT inhaler, INHALE 1 TO 2 PUFFS INTO THE LUNGS EVERY 6 HOURS AS NEEDED FOR WHEEZING OR SHORTNESS OF BREATH, Disp: 18 g, Rfl: 3 ?  cyclobenzaprine (FLEXERIL) 10 MG tablet, Take 10 mg by mouth at bedtime as needed for muscle spasms. , Disp: , Rfl:  ?  pantoprazole (PROTONIX) 40 MG tablet, Take 1 tablet (40 mg total) by mouth daily at 6 (six) AM., Disp: 45 tablet, Rfl: 1 ?  pregabalin (LYRICA) 100 MG capsule, Take 100 mg by mouth 2 (two) times daily., Disp: , Rfl:  ?  traMADol (ULTRAM) 50 MG tablet, Take 100 mg by mouth every 8 (eight) hours as needed., Disp: , Rfl:  ?  zolpidem (AMBIEN) 10 MG tablet, TAKE ONE TABLET BY MOUTH ONE TIME DAILY AT BEDTIME FOR SLEEP, Disp: 30 tablet, Rfl: 5 ?  levothyroxine (SYNTHROID)  50 MCG tablet, Take 1 tablet (50 mcg total) by mouth daily before breakfast., Disp: 30 tablet, Rfl: 1 ?  lithium carbonate 300 MG capsule, Take 2 capsules (600 mg total) by mouth at bedtime., Disp: 180 capsule, Rfl: 1 ?  QUEtiapine (SEROQUEL) 400 MG tablet, Take 2 tablets (800 mg total) by mouth at bedtime., Disp: 180 tablet, Rfl: 1 ?  traZODone (DESYREL) 100 MG tablet, TAKE ONE TO TWO TABLETS BY MOUTH AT BEDTIME AS NEEDED FOR SLEEP, Disp: 180 tablet, Rfl: 1 ?Medication Side Effects: none ? ?Family Medical/ Social History: Changes? No ? ?MENTAL HEALTH EXAM: ? ?There were no vitals taken for this visit.There is no height or weight on file to  calculate BMI.  ?General Appearance: Casual, Neat, Well Groomed and Obese  ?Eye Contact:  Good  ?Speech:  Clear and Coherent and Normal Rate  ?Volume:  Normal  ?Mood:  Euthymic  ?Affect:  Appropriate  ?Thought Process:  Goal Directed and Descriptions of Associations: Intact  ?Orientation:  Full (Time, Place, and Person)  ?Thought Content: Logical   ?Suicidal Thoughts:  No  ?Homicidal Thoughts:  No  ?Memory:  WNL  ?Judgement:  Good  ?Insight:  Good  ?Psychomotor Activity:  Normal  ?Concentration:  Concentration: Good and Attention Span: Good  ?Recall:  Good  ?Fund of Knowledge: Good  ?Language: Good  ?Assets:  Desire for Improvement  ?ADL's:  Intact  ?Cognition: WNL  ?Prognosis:  Good  ? ?Labs 01/24/2021 ?CBC with differential normal except platelets 144, not significantly low ?CMP all normal, specifically BUN and creatinine were fine. ?Hemoglobin A1c 5.0 ?Lipid panel total cholesterol 221, triglycerides 142, HDL 56, LDL 140 ?Lithium level 0.7 ?TSH 0.029  ? ?DIAGNOSES:  ?  ICD-10-CM   ?1. Schizoaffective disorder, bipolar type ([place])  Z00.9 Basic metabolic panel  ?  Lithium level  ?  Hemoglobin A1c  ?  Lipid panel  ?  CBC with Differential/Platelet  ?  TSH  ?  ?2. Insomnia due to other mental disorder  F51.05   ? F99   ?  ?3. Generalized anxiety disorder  F41.1   ?  ?4. Encounter for long-term (current) use of medications  Q33.007 Basic metabolic panel  ?  Lithium level  ?  Hemoglobin A1c  ?  Lipid panel  ?  CBC with Differential/Platelet  ?  TSH  ?  ?5. 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  ? ? ?Receiving Psychotherapy: No   ? ? ?RECOMMENDATIONS:  ?PDMP was reviewed.  Last Ambien filled 05/17/2021.  Lyrica and tramadol filled in February written by another provider. ?I provided 40 minutes of face to face time during this encounter, including time spent before and after the visit in records review, medical decision making, counseling pertinent to today's visit, and charting.  ?Unfortunately, I am unable to tell  whether the low TSH was dealt with or not.  On the lab note, I report speaking with her previous PCP who stated he is no longer in primary care.  I asked our CMAS to contact her so we could decrease the levothyroxine, I am not sure what happened after that and I apologized to the patient for that falling through the cracks.  I am unable to find any report of levothyroxine prescribed since 2021.  She thinks she may have gotten it from a mail order pharmacy or somewhere.  She is not sure.  She will check the levothyroxine dose when she gets home and call back with that. ?  Since she is leaving next week to go to [place] for 2 weeks I have asked her to get the labs drawn after she gets back home.  Reminded her of the importance of this, she does not need to put off having labs drawn.  She understands the risk of having hyperthyroidism due to overcorrection, and long-term problems that that can cause. ? ??  Levothyroxine dose.  Continue same until lab results are back. ?Continue lithium 300 mg, 2 p.o. nightly. ?Continue Seroquel 400 mg, 2 p.o. nightly. ?Continue trazodone 100 mg, 2 p.o. nightly as needed. ?Continue Ambien 10 mg 1 nightly as needed sleep. ?Labs ordered as noted above.  Stressed the importance of having them drawn. ?Return in 6 months. ? ?Donnal Moat, PA-C  ?

## 2021-05-28 ENCOUNTER — Ambulatory Visit: Payer: Medicare (Managed Care) | Admitting: Physician Assistant

## 2021-06-15 ENCOUNTER — Other Ambulatory Visit: Payer: Self-pay | Admitting: Physician Assistant

## 2021-06-15 HISTORY — PX: BACK SURGERY: SHX140

## 2021-07-03 ENCOUNTER — Institutional Professional Consult (permissible substitution): Payer: Medicare (Managed Care) | Admitting: Pulmonary Disease

## 2021-09-12 LAB — BASIC METABOLIC PANEL
BUN/Creatinine Ratio: 17 (ref 9–23)
BUN: 16 mg/dL (ref 6–24)
CO2: 23 mmol/L (ref 20–29)
Calcium: 9.9 mg/dL (ref 8.7–10.2)
Chloride: 103 mmol/L (ref 96–106)
Creatinine, Ser: 0.92 mg/dL (ref 0.57–1.00)
Glucose: 98 mg/dL (ref 70–99)
Potassium: 3.9 mmol/L (ref 3.5–5.2)
Sodium: 140 mmol/L (ref 134–144)
eGFR: 74 mL/min/{1.73_m2} (ref >59)

## 2021-09-12 LAB — CBC WITH DIFFERENTIAL/PLATELET
Basophils Absolute: 0 10*3/uL (ref 0.0–0.2)
Basos: 0 %
EOS (ABSOLUTE): 0 10*3/uL (ref 0.0–0.4)
Eos: 0 %
Hematocrit: 39.8 % (ref 34.0–46.6)
Hemoglobin: 13.2 g/dL (ref 11.1–15.9)
Immature Grans (Abs): 0 10*3/uL (ref 0.0–0.1)
Immature Granulocytes: 0 %
Lymphocytes Absolute: 1.2 10*3/uL (ref 0.7–3.1)
Lymphs: 32 %
MCH: 30.1 pg (ref 26.6–33.0)
MCHC: 33.2 g/dL (ref 31.5–35.7)
MCV: 91 fL (ref 79–97)
Monocytes Absolute: 0.2 10*3/uL (ref 0.1–0.9)
Monocytes: 6 %
Neutrophils Absolute: 2.3 10*3/uL (ref 1.4–7.0)
Neutrophils: 62 %
Platelets: 139 10*3/uL — ABNORMAL LOW (ref 150–450)
RBC: 4.39 x10E6/uL (ref 3.77–5.28)
RDW: 12.9 % (ref 11.7–15.4)
WBC: 3.7 10*3/uL (ref 3.4–10.8)

## 2021-09-12 LAB — HEMOGLOBIN A1C
Est. average glucose Bld gHb Est-mCnc: 100 mg/dL
Hgb A1c MFr Bld: 5.1 % (ref 4.8–5.6)

## 2021-09-12 LAB — TSH: TSH: 13.6 u[IU]/mL — ABNORMAL HIGH (ref 0.450–4.500)

## 2021-09-12 LAB — LITHIUM LEVEL: Lithium Lvl: 0.9 mmol/L (ref 0.5–1.2)

## 2021-09-16 NOTE — Progress Notes (Signed)
Please let her know that CBC, BMP, A1c, and lithium level are good.  Continue the same dose of lithium. Her TSH is elevated at 13.6.  Please confirm her dose of levothyroxine.  She was not sure the dose at the last visit.  I will need to increase the dose of levothyroxine.  Thank you

## 2021-09-19 ENCOUNTER — Encounter: Payer: Self-pay | Admitting: Physician Assistant

## 2021-10-31 ENCOUNTER — Encounter: Payer: Self-pay | Admitting: Physician Assistant

## 2021-10-31 ENCOUNTER — Ambulatory Visit (INDEPENDENT_AMBULATORY_CARE_PROVIDER_SITE_OTHER): Payer: Medicare (Managed Care) | Admitting: Physician Assistant

## 2021-10-31 DIAGNOSIS — E032 Hypothyroidism due to medicaments and other exogenous substances: Secondary | ICD-10-CM | POA: Diagnosis not present

## 2021-10-31 DIAGNOSIS — F250 Schizoaffective disorder, bipolar type: Secondary | ICD-10-CM

## 2021-10-31 DIAGNOSIS — F411 Generalized anxiety disorder: Secondary | ICD-10-CM | POA: Diagnosis not present

## 2021-10-31 MED ORDER — TRAZODONE HCL 100 MG PO TABS: ORAL_TABLET | ORAL | 1 refills | Status: DC

## 2021-10-31 MED ORDER — LITHIUM CARBONATE 300 MG PO CAPS: 600.0000 mg | ORAL_CAPSULE | Freq: Every day | ORAL | 1 refills | Status: DC

## 2021-10-31 MED ORDER — QUETIAPINE FUMARATE 400 MG PO TABS
800.0000 mg | ORAL_TABLET | Freq: Every day | ORAL | 1 refills | Status: DC
Start: 2021-10-31 — End: 2022-05-06

## 2021-10-31 NOTE — Progress Notes (Signed)
[facility]  Patient ID: [name],  MRN: [id number]  PCP: Pcp, No  Date of Evaluation: 10/31/2021  time spent:30 minutes  Chief Complaint:  Chief Complaint   Depression; Insomnia; Follow-up     HISTORY/CURRENT STATUS: HPI For routine med check.  She is doing well. Patient is able to enjoy things.  Energy and motivation are good.  No extreme sadness, tearfulness, or feelings of hopelessness.  Sleeps well with the trazodone and Ambien.  ADLs and personal hygiene are normal.   Denies any changes in concentration, making decisions, or remembering things.  Appetite has not changed.  Weight is stable.  No complaints of anxiety.  Denies suicidal or homicidal thoughts.  Patient denies increased energy with decreased need for sleep, increased talkativeness, racing thoughts, impulsivity or risky behaviors, increased spending, increased libido, grandiosity, increased irritability or anger, paranoia, and no hallucinations.  Review of Systems  Constitutional: Negative.   HENT: Negative.    Eyes: Negative.   Respiratory: Negative.    Cardiovascular: Negative.   Gastrointestinal: Negative.   Genitourinary: Negative.   Musculoskeletal: Negative.   Skin: Negative.   Neurological: Negative.   Endo/Heme/Allergies: Negative.   Psychiatric/Behavioral:         See HPI   Individual Medical History/ Review of Systems: Changes? :Yes    she is being worked up for breast cancer, she is hoping to have a prophylactic bilateral mastectomy due to high risk.  Her mother and father both had breast cancer.  Past medications for mental health diagnoses include: Depakote, Tegretol, BuSpar, lithium, Seroquel, trazodone, Ambien  Allergies: Patient has no known allergies.  Current Medications:  Current Outpatient Medications:    albuterol (VENTOLIN HFA) 108 (90 Base) MCG/ACT inhaler, INHALE 1 TO 2 PUFFS INTO THE LUNGS EVERY 6 HOURS AS NEEDED FOR WHEEZING OR SHORTNESS OF BREATH, Disp: 18 g,  Rfl: 3   cyclobenzaprine (FLEXERIL) 10 MG tablet, Take 10 mg by mouth at bedtime as needed for muscle spasms. , Disp: , Rfl:    pantoprazole (PROTONIX) 40 MG tablet, Take 1 tablet (40 mg total) by mouth daily at 6 (six) AM., Disp: 45 tablet, Rfl: 1   pregabalin (LYRICA) 100 MG capsule, Take 100 mg by mouth 2 (two) times daily., Disp: , Rfl:    traMADol (ULTRAM) 50 MG tablet, Take 100 mg by mouth every 8 (eight) hours as needed., Disp: , Rfl:    zolpidem (AMBIEN) 10 MG tablet, TAKE ONE TABLET BY MOUTH AT BEDTIME AS NEEDED FOR SLEEP, Disp: 30 tablet, Rfl: 5   levothyroxine (SYNTHROID) 50 MCG tablet, Take 1 tablet (50 mcg total) by mouth daily before breakfast., Disp: 30 tablet, Rfl: 1   lithium carbonate 300 MG capsule, Take 2 capsules (600 mg total) by mouth at bedtime., Disp: 180 capsule, Rfl: 1   QUEtiapine (SEROQUEL) 400 MG tablet, Take 2 tablets (800 mg total) by mouth at bedtime., Disp: 180 tablet, Rfl: 1   traZODone (DESYREL) 100 MG tablet, TAKE ONE TO TWO TABLETS BY MOUTH AT BEDTIME AS NEEDED FOR SLEEP, Disp: 180 tablet, Rfl: 1 Medication Side Effects: none  Family Medical/ Social History: Changes? No  MENTAL HEALTH EXAM:  There were no vitals taken for this visit.There is no height or weight on file to calculate BMI.  General Appearance: Casual, Neat, Well Groomed and Obese  Eye Contact:  Good  Speech:  Clear and Coherent and Normal Rate  Volume:  Normal  Mood:  Euthymic  Affect:  Appropriate  Thought Process:  Goal Directed and Descriptions of Associations: Intact  Orientation:  Full (Time, Place, and Person)  Thought Content: Logical   Suicidal Thoughts:  No  Homicidal Thoughts:  No  Memory:  WNL  Judgement:  Good  Insight:  Good  Psychomotor Activity:  Normal  Concentration:  Concentration: Good and Attention Span: Good  Recall:  Good  Fund of Knowledge: Good  Language: Good  Assets:  Desire for Improvement  ADL's:  Intact  Cognition: WNL  Prognosis:  Good   Labs   09/10/2021 Lithium level 0.9 TSH 13.6 CMP nl CBC nl   DIAGNOSES:    ICD-10-CM   1. Schizoaffective disorder, bipolar type (HCC)  F25.0 TSH    2. Generalized anxiety disorder  F41.1 TSH    3. 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  Last Ambien 7/28//2023.  I provided 30 minutes of face to face time during this encounter, including time spent before and after the visit in records review, medical decision making, counseling pertinent to today's visit, and charting.   Patient still not sure of Synthroid dose.  I have asked her, again, to call our office this afternoon while she is looking at her bottle and give me the correct dose.  She does not remember being notified of the abnormal TSH level that was drawn in June, so I am not sure if she increased the dose or left the Synthroid the same.  This seems to be an issue every time.  Continue Synthroid, questionable dose but I think it is 50 mcg. Continue lithium 300 mg, 2 p.o. nightly. Continue Seroquel 400 mg, 2 p.o. nightly. Continue trazodone 100 mg, 2 p.o. nightly as needed. Continue Ambien 10 mg 1 nightly as needed sleep. Lab ordered as above to be drawn sometime in the next couple of weeks. Return in 6 months.  Donnal Moat, PA-C

## 2021-11-19 ENCOUNTER — Ambulatory Visit: Payer: Medicare (Managed Care) | Admitting: Physician Assistant

## 2021-12-03 ENCOUNTER — Telehealth: Payer: Self-pay | Admitting: Physician Assistant

## 2021-12-03 NOTE — Telephone Encounter (Signed)
Last filled 8/27, due 9/25

## 2021-12-03 NOTE — Telephone Encounter (Signed)
Pt called asking for a refill on her ambien. She has 10 days left.Pharmacy is [facility] [address]

## 2021-12-09 ENCOUNTER — Other Ambulatory Visit: Payer: Self-pay

## 2021-12-09 MED ORDER — ZOLPIDEM TARTRATE 10 MG PO TABS: ORAL_TABLET | ORAL | 5 refills | Status: DC

## 2021-12-09 NOTE — Telephone Encounter (Signed)
Pended.

## 2021-12-15 HISTORY — PX: BACK SURGERY: SHX140

## 2021-12-16 ENCOUNTER — Telehealth: Payer: Medicare (Managed Care) | Admitting: Physician Assistant

## 2021-12-16 DIAGNOSIS — Z7689 Persons encountering health services in other specified circumstances: Secondary | ICD-10-CM | POA: Diagnosis not present

## 2021-12-16 NOTE — Patient Instructions (Signed)
  [name], thank you for joining Rodney Booze, PA-C for today's virtual visit.  While this provider is not your primary care provider (PCP), if your PCP is located in our provider database this encounter information will be shared with them immediately following your visit.  Consent: (Patient) [name] provided verbal consent for this virtual visit at the beginning of the encounter.  Current Medications:  Current Outpatient Medications:    albuterol (VENTOLIN HFA) 108 (90 Base) MCG/ACT inhaler, INHALE 1 TO 2 PUFFS INTO THE LUNGS EVERY 6 HOURS AS NEEDED FOR WHEEZING OR SHORTNESS OF BREATH, Disp: 18 g, Rfl: 3   cyclobenzaprine (FLEXERIL) 10 MG tablet, Take 10 mg by mouth at bedtime as needed for muscle spasms. , Disp: , Rfl:    levothyroxine (SYNTHROID) 50 MCG tablet, Take 1 tablet (50 mcg total) by mouth daily before breakfast., Disp: 30 tablet, Rfl: 1   lithium carbonate 300 MG capsule, Take 2 capsules (600 mg total) by mouth at bedtime., Disp: 180 capsule, Rfl: 1   pantoprazole (PROTONIX) 40 MG tablet, Take 1 tablet (40 mg total) by mouth daily at 6 (six) AM., Disp: 45 tablet, Rfl: 1   pregabalin (LYRICA) 100 MG capsule, Take 100 mg by mouth 2 (two) times daily., Disp: , Rfl:    QUEtiapine (SEROQUEL) 400 MG tablet, Take 2 tablets (800 mg total) by mouth at bedtime., Disp: 180 tablet, Rfl: 1   traMADol (ULTRAM) 50 MG tablet, Take 100 mg by mouth every 8 (eight) hours as needed., Disp: , Rfl:    traZODone (DESYREL) 100 MG tablet, TAKE ONE TO TWO TABLETS BY MOUTH AT BEDTIME AS NEEDED FOR SLEEP, Disp: 180 tablet, Rfl: 1   zolpidem (AMBIEN) 10 MG tablet, TAKE ONE TABLET BY MOUTH AT BEDTIME AS NEEDED FOR SLEEP, Disp: 30 tablet, Rfl: 5   Medications ordered in this encounter:  No orders of the defined types were placed in this encounter.    *If you need refills on other medications prior to your next appointment, please contact your pharmacy*  Follow-Up: Call back or seek an  in-person evaluation if the symptoms worsen or if the condition fails to improve as anticipated.  [facility] [phone]  Other Instructions Follow up with your regular doctor in 1 week for reassessment and seek care sooner if your symptoms worsen or fail to improve.    If you have been instructed to have an in-person evaluation today at a local Urgent Care facility, please use the link below. It will take you to a list of all of our available [facility], including address, phone number and hours of operation. Please do not delay care.  [facility]  If you or a family member do not have a primary care provider, use the link below to schedule a visit and establish care. When you choose a [facility] primary care physician or advanced practice provider, you gain a long-term partner in health. Find a Primary Care Provider  Learn more about [facility] in-office and virtual care options: [facility]

## 2021-12-16 NOTE — Progress Notes (Signed)
[name], [name] are scheduled for a virtual visit with your provider today.    Just as we do with appointments in the office, we must obtain your consent to participate.  Your consent will be active for this visit and any virtual visit you may have with one of our providers in the next 365 days.    If you have a MyChart account, I can also send a copy of this consent to you electronically.  All virtual visits are billed to your insurance company just like a traditional visit in the office.  As this is a virtual visit, video technology does not allow for your provider to perform a traditional examination.  This may limit your provider's ability to fully assess your condition.  If your provider identifies any concerns that need to be evaluated in person or the need to arrange testing such as labs, EKG, etc, we will make arrangements to do so.    Although advances in technology are sophisticated, we cannot ensure that it will always work on either your end or our end.  If the connection with a video visit is poor, we may have to switch to a telephone visit.  With either a video or telephone visit, we are not always able to ensure that we have a secure connection.   I need to obtain your verbal consent now.   Are you willing to proceed with your visit today?   [name] has provided verbal consent on 12/16/2021 for a virtual visit (video or telephone).   Rodney Booze, [place] 12/16/2021  6:38 PM   Date:  12/16/2021   ID:  [name], DOB [birth year], MRN [id number]  Patient Location: Home Provider Location: Home Office   Participants: Patient and Provider for Visit and Wrap up  Method of visit: Video  Location of Patient: Home Location of Provider: Home Office Consent was obtain for visit over the video. Services rendered by provider: Visit was performed via video  A video enabled telemedicine application was used and I verified that I am speaking with the correct person using two  identifiers.  PCP:  Pcp, No   Chief Complaint:  medication   History of Present Illness:    [name] is a 56 y.o. female with history as stated below. Presents video telehealth for an acute care visit  Pt states she is scheduled to have surgery and was told she needs to lose weight. She is interested in starting ozempic  Past Medical, Surgical, Social History, Allergies, and Medications have been Reviewed.  Past Medical History:  Diagnosis Date   Anxiety    Bipolar affective disorder, mixed, in partial or remission    Depression    Dysrhythmia    Left sided heart diease   Headache(784.0)    History of gallstones    Kidney stones    Mental disorder    Migraine    Pneumonia    Hx: of as a child   PONV (postoperative nausea and vomiting)    Renal disorder    Sleep apnea    sleep test 2014, did not have osa   Substance abuse (HCC)    Fentanyl, Percocet. last use 04/2011    No outpatient medications have been marked as taking for the 12/16/21 encounter (Video Visit) with [place].     Allergies:   Patient has no known allergies.   ROS See HPI for history of present illness.  Physical Exam Constitutional:  Appearance: Normal appearance. She is not ill-appearing.  Neurological:     Mental Status: She is alert.             MDM:  pt requesting to be started on ozempic for weight loss. Advised the she needs to f/u with pcp for this. Discussed importance of diet and exercise.   There are no diagnoses linked to this encounter.   Time:   Today, I have spent 9 minutes with the patient with telehealth technology discussing the above problems, reviewing the chart, previous notes, medications and orders.    Tests Ordered: No orders of the defined types were placed in this encounter.   Medication Changes: No orders of the defined types were placed in this encounter.    Disposition:  Follow up  Signed, Rodney Booze, PA-C  12/16/2021  6:38 PM

## 2021-12-17 ENCOUNTER — Telehealth: Payer: Self-pay

## 2021-12-17 NOTE — Telephone Encounter (Signed)
Patient has not been seen since January of 2022 [name] is correct we are unable to complete this letter she will need a new PCP.

## 2021-12-17 NOTE — Telephone Encounter (Signed)
(  FYI) pt called stating that use to see [name] at this office. I told pt that [name] doesn't work at this office anymore. Pt wanted one of the providers from our office to write letter and send pt medical records to her insurance to clear her for weight loss surgery. I told [name] our providers couldn't help her with that . [name] stated that she would write the letter herself and hung up.

## 2022-01-13 ENCOUNTER — Other Ambulatory Visit (HOSPITAL_COMMUNITY): Payer: Self-pay | Admitting: Surgery

## 2022-01-21 ENCOUNTER — Encounter: Payer: Self-pay | Admitting: Dietician

## 2022-01-21 ENCOUNTER — Encounter: Payer: Medicare (Managed Care) | Attending: Surgery | Admitting: Dietician

## 2022-01-21 VITALS — Ht 66.0 in | Wt 237.5 lb

## 2022-01-21 DIAGNOSIS — E669 Obesity, unspecified: Secondary | ICD-10-CM

## 2022-01-21 DIAGNOSIS — Z6838 Body mass index (BMI) 38.0-38.9, adult: Secondary | ICD-10-CM | POA: Insufficient documentation

## 2022-01-21 DIAGNOSIS — Z713 Dietary counseling and surveillance: Secondary | ICD-10-CM | POA: Insufficient documentation

## 2022-01-21 NOTE — Progress Notes (Signed)
Nutrition Assessment for Bariatric Surgery Medical Nutrition Therapy Appt Start Time: 10:33    End Time: 11:35  Patient was seen on 01/21/2022 for Pre-Operative Nutrition Assessment. Letter of approval faxed to [facility] bariatric surgery program coordinator on 01/21/2022.   Referral stated Supervised Weight Loss (SWL) visits needed: 6 months  Planned surgery: Sleeve Gastrectomy   NUTRITION ASSESSMENT   Anthropometrics  Start weight at [facility]: 237.5 lbs (date: 01/21/2022)  Height: 66 in BMI: 38.33 kg/m2     Clinical  Medical hx: anxiety, depression, sleep apnea, hx of substance abuse Medications: Seroquel, trazadone, lithium, Ambien, Lyrica, tramadol, cyclobenzaprine   Labs: TSH 13.6 Notable signs/symptoms: skin dry Any previous deficiencies? No  Micronutrient Nutrition Focused Physical Exam: Hair: No issues observed Eyes: No issues observed Mouth: No issues observed Neck: No issues observed Nails: No issues observed Skin: dry  Lifestyle & Dietary Hx  Pt arrived with husband. Patient lives with husband one son. The pt's husband performs the food shopping and the pt prepares the meals. She reports that she typically skips or misses 10-12 out of 21 possible meals per week. She may have 2 meals per week that are take-out or at a restaurant.  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 She denies binge eating and denies feeling shame and/or guilt after eating too much food.  She denies having used laxatives or vomiting to facilitate weight loss. She denies emotional eating during times of stress. She states that she knows the difference between hunger and thirst and can tell when she is full. Pt states she gained weight after taking Depakote. Pt states she likes cupcakes, stating she will eat 3 at a time.  Physical Activity: ADLs  Sleep Hygiene: duration and quality: fine, about 7 hours with naps sometimes  Current Patient Perceived Stress  Level as stated by pt on a scale of 1-10: 4      Stress Management Techniques: breathing, relaxation exercise.  Fruit servings per week: 8 Non starchy vegetable servings per week: 0 Whole Grains per week: 1-2  24-Hr Dietary Recall First Meal: banana or oatmeal Snack:  Second Meal: cheeseburger patty (no bread), cupcake  Snack:  Third Meal: gold fish, vending machine Snack:  Beverages: Dr. Malachi Bonds, lemonade, fairlife lowfat milk  Alcoholic beverages per week: 0   Estimated Energy Needs Calories: 1500   NUTRITION DIAGNOSIS  Overweight/obesity ([place]-3.3) related to past poor dietary habits and physical inactivity as evidenced by patient w/ planned sleeve surgery following dietary guidelines for continued weight loss.    NUTRITION INTERVENTION  Nutrition counseling (C-1) and education (E-2) to facilitate bariatric surgery goals.  Educated pt on micronutrient deficiencies post surgery and strategies to mitigate that risk   Pre-Op Goals Reviewed with the Patient Track food and beverage intake (pen and paper, MyFitness Pal, Baritastic app, etc.) Make healthy food choices while monitoring portion sizes Consume 3 meals per day or try to eat every 3-5 hours Avoid concentrated sugars and fried foods Keep sugar & fat in the single digits per serving on food labels Practice CHEWING your food (aim for applesauce consistency) Practice not drinking 15 minutes before, during, and 30 minutes after each meal and snack Avoid all carbonated beverages (ex: soda, sparkling beverages)  Limit caffeinated beverages (ex: coffee, tea, energy drinks) Avoid all sugar-sweetened beverages (ex: regular soda, sports drinks)  Avoid alcohol  Aim for 64-100 ounces of FLUID daily (with at least half of fluid intake being plain water)  Aim  for at least 60-80 grams of PROTEIN daily Look for a liquid protein source that contains ?15 g protein and ?5 g carbohydrate (ex: shakes, drinks, shots) Make a list of non-food  related activities Physical activity is an important part of a healthy lifestyle so keep it moving! The goal is to reach 150 minutes of exercise per week, including cardiovascular and weight baring activity.  *Goals that are bolded indicate the pt would like to start working towards these  Handouts Provided Include  Bariatric Surgery handouts (Nutrition Visits, Pre-Op Goals, Protein Shakes, Vitamins & Minerals) Meal Ideas handout  Learning Style & Readiness for Change Teaching method utilized: Visual & Auditory  Demonstrated degree of understanding via: Teach Back  Readiness Level: preparation Barriers to learning/adherence to lifestyle change: previous habits of sweets  RD's Notes for Next Visit Progress toward patient's chosen goals.   MONITORING & EVALUATION Dietary intake, weekly physical activity, body weight, and pre-op goals reached at next nutrition visit.    Next Steps  Patient is to follow up at [place] in one month for SWL visit.

## 2022-01-27 ENCOUNTER — Encounter: Payer: Self-pay | Admitting: Nurse Practitioner

## 2022-02-04 ENCOUNTER — Encounter: Payer: Medicare (Managed Care) | Attending: Surgery | Admitting: Dietician

## 2022-02-04 ENCOUNTER — Encounter: Payer: Self-pay | Admitting: Dietician

## 2022-02-04 VITALS — Ht 66.0 in | Wt 230.0 lb

## 2022-02-04 DIAGNOSIS — Z6837 Body mass index (BMI) 37.0-37.9, adult: Secondary | ICD-10-CM | POA: Insufficient documentation

## 2022-02-04 DIAGNOSIS — Z713 Dietary counseling and surveillance: Secondary | ICD-10-CM | POA: Insufficient documentation

## 2022-02-04 DIAGNOSIS — Z9884 Bariatric surgery status: Secondary | ICD-10-CM | POA: Insufficient documentation

## 2022-02-04 DIAGNOSIS — E669 Obesity, unspecified: Secondary | ICD-10-CM | POA: Insufficient documentation

## 2022-02-04 NOTE — Progress Notes (Signed)
Supervised Weight Loss Visit Bariatric Nutrition Education Appt Start Time: 11:03    End Time: 11:25  Planned surgery: Sleeve Gastrectomy  Referral stated Supervised Weight Loss (SWL) visits needed: 6 months 1 out of 6 SWL Appointments    NUTRITION ASSESSMENT   Anthropometrics  Start weight at [facility]: 237.5 lbs (date: 01/21/2022)  Height: 66 in Weight today: 230.0 lbs. BMI: 37.12 kg/m2     Clinical  Medical hx: anxiety, depression, sleep apnea, hx of substance abuse Medications: Seroquel, trazadone, lithium, Ambien, Lyrica, tramadol, cyclobenzaprine, Ozempic  Labs: TSH 13.6 Notable signs/symptoms: skin dry Any previous deficiencies? No  Lifestyle & Dietary Hx  Pt states she is taking Ozempic now. Pt states she stopped eating the cupcakes, stating she has not had any since last visit. Pt states she had a little Dr. Malachi Bonds (approx 4 oz) once. Pt agreeable to track her protein intake and make balanced meals with a lean protein, non-starchy vegetables and complex carbohydrates.  Estimated daily fluid intake: 40 oz Supplements: no Current average weekly physical activity: ADLs with work  24-Hr Dietary Recall First Meal: oatmeal and banana or eggs Snack:  Second Meal: can of lintel soup Snack: two apples Third Meal: lintle soup or potato or chicken Snack:   Beverages: water, skim  Estimated Energy Needs Calories: 1500   NUTRITION DIAGNOSIS  Overweight/obesity ([place]-3.3) related to past poor dietary habits and physical inactivity as evidenced by patient w/ planned sleeve surgery following dietary guidelines for continued weight loss.   NUTRITION INTERVENTION  Nutrition counseling (C-1) and education (E-2) to facilitate bariatric surgery goals.  Why you need complex carbohydrates: Whole grains and other complex carbohydrates are required to have a healthy diet. Whole grains provide fiber which can help with blood glucose levels and help keep you satiated. Fruits and starchy  vegetables provide essential vitamins and minerals required for immune function, eyesight support, brain support, bone density, wound healing and many other functions within the body. According to the current evidenced based 2020-2025 Dietary Guidelines for Americans, complex carbohydrates are part of a healthy eating pattern which is associated with a decreased risk for type 2 diabetes, cancers, and cardiovascular disease.    Pre-Op Goals Reviewed with the Patient Track food and beverage intake (pen and paper, MyFitness Pal, Baritastic app, etc.) Make healthy food choices while monitoring portion sizes Consume 3 meals per day or try to eat every 3-5 hours Avoid concentrated sugars and fried foods Keep sugar & fat in the single digits per serving on food labels Practice CHEWING your food (aim for applesauce consistency) Practice not drinking 15 minutes before, during, and 30 minutes after each meal and snack Avoid all carbonated beverages (ex: soda, sparkling beverages)  Limit caffeinated beverages (ex: coffee, tea, energy drinks) Avoid all sugar-sweetened beverages (ex: regular soda, sports drinks)  Avoid alcohol  Aim for 64-100 ounces of FLUID daily (with at least half of fluid intake being plain water)  Aim for at least 60-80 grams of PROTEIN daily Look for a liquid protein source that contains ?15 g protein and ?5 g carbohydrate (ex: shakes, drinks, shots) Make a list of non-food related activities Physical activity is an important part of a healthy lifestyle so keep it moving! The goal is to reach 150 minutes of exercise per week, including cardiovascular and weight baring activity.  *Goals that are bolded indicate the pt would like to start working towards these  Pre-Op Goals Progress & New Goals Continue: avoid concentrated sugars (cup cakes) Continue: avoid sugar-sweetened beverages  New: use meal ideas handout to help guide healthy food choices for meals and snacks New: track  protein intake.  Handouts Provided Include  Meal Ideas handout  Learning Style & Readiness for Change Teaching method utilized: Visual & Auditory  Demonstrated degree of understanding via: Teach Back  Readiness Level: preparation Barriers to learning/adherence to lifestyle change: previous habits of sweets  RD's Notes for next Visit  Patient progress toward chosen goals.   MONITORING & EVALUATION Dietary intake, weekly physical activity, body weight, and pre-op goals in 1 month.   Next Steps  Patient is to return to [facility] in one month for next SWL visit.

## 2022-02-27 ENCOUNTER — Ambulatory Visit (HOSPITAL_COMMUNITY): Payer: Self-pay | Admitting: Licensed Clinical Social Worker

## 2022-03-04 ENCOUNTER — Ambulatory Visit: Payer: Medicare (Managed Care) | Admitting: Dietician

## 2022-03-05 ENCOUNTER — Encounter: Payer: Self-pay | Admitting: Nurse Practitioner

## 2022-03-05 ENCOUNTER — Ambulatory Visit (INDEPENDENT_AMBULATORY_CARE_PROVIDER_SITE_OTHER): Payer: Medicare (Managed Care) | Admitting: Nurse Practitioner

## 2022-03-05 VITALS — BP 112/68 | HR 70 | Ht 66.0 in | Wt 218.0 lb

## 2022-03-05 DIAGNOSIS — R1310 Dysphagia, unspecified: Secondary | ICD-10-CM | POA: Diagnosis not present

## 2022-03-05 DIAGNOSIS — K219 Gastro-esophageal reflux disease without esophagitis: Secondary | ICD-10-CM

## 2022-03-05 DIAGNOSIS — K222 Esophageal obstruction: Secondary | ICD-10-CM | POA: Diagnosis not present

## 2022-03-05 DIAGNOSIS — Z1211 Encounter for screening for malignant neoplasm of colon: Secondary | ICD-10-CM | POA: Diagnosis not present

## 2022-03-05 MED ORDER — PANTOPRAZOLE SODIUM 40 MG PO TBEC: 40.0000 mg | DELAYED_RELEASE_TABLET | Freq: Every day | ORAL | 2 refills | Status: DC

## 2022-03-05 MED ORDER — NA SULFATE-K SULFATE-MG SULF 17.5-3.13-1.6 GM/177ML PO SOLN: 1.0000 | Freq: Once | ORAL | 0 refills | Status: AC

## 2022-03-05 NOTE — Progress Notes (Signed)
03/05/2022 [name] [id number] 27-Feb-1966   CHIEF COMPLAINT: Difficulty swallowing, schedule an upper endoscopy and colonoscopy  HISTORY OF PRESENT ILLNESS: [name] is a 55 year old female with a past medical history of Zaidi, depression, bipolar disorder, kidney stones, migraine headaches and esophageal stenosis.  She presents today to schedule a screening colonoscopy and upper endoscopy.  She is accompanied by her husband.  She endorses having difficulty swallowing, she describes food such as [employer] sandwiches and rice gets stuck to the mid esophagus results and vomiting out the stuck food and occurs once or twice monthly or less the past few months.  She describes feeling if she is having esophageal spasm during these episodes as well. She has heartburn 3 to 4 days weekly.  She takes Protonix on the day she has heartburn.  She denies having any upper or lower abdominal pain.  Her most recent EGD was 05/17/2019 which identified a benign-appearing esophageal stenosis and gastritis.  Biopsies were negative for H. pylori.  She is passing a formed bowel movement once or twice weekly.  No rectal bleeding or black stools.  She takes Dulcolax every third night if she has not passed a bowel movement in 3 days and she uses a water enema every 4 to 5 days as needed.  She started Ozempic 1 and half months ago for weight loss and has lost about 20 pounds.  She underwent a colonoscopy 11/16/2013 showed erythema, granularity throughout most of the colon, no polyps.  Biopsies of the rectosigmoid colon showed ulcerated colonic mucosa without active inflammation, dysplasia or malignancy.  The remainder of the colon biopsies were negative for active inflammation in the TI biopsy showed prominent intramucosal lymphoid aggregates.  Both parents had breast cancer and she is under evaluation at [facility] to have a future elective bilateral mastectomy.     Latest Ref Rng & Units 09/10/2021   11:38 AM 01/24/2021    10:55 AM 02/20/2020    1:51 PM  CBC  WBC 3.4 - 10.8 x10E3/uL 3.7  3.5  4.2   Hemoglobin 11.1 - 15.9 g/dL 13.2  13.0  12.2   Hematocrit 34.0 - 46.6 % 39.8  39.8  35.7   Platelets 150 - 450 x10E3/uL 139  144  151.0        Latest Ref Rng & Units 09/10/2021   11:38 AM 01/24/2021   10:55 AM 02/20/2020    1:51 PM  CMP  Glucose 70 - 99 mg/dL 98  97  117   BUN 6 - 24 mg/dL '16  14  13   '$ Creatinine 0.57 - 1.00 mg/dL 0.92  0.90  0.97   Sodium 134 - 144 mmol/L 140  142  141   Potassium 3.5 - 5.2 mmol/L 3.9  4.5  3.7   Chloride 96 - 106 mmol/L 103  106  107   CO2 20 - 29 mmol/L '23  24  25   '$ Calcium 8.7 - 10.2 mg/dL 9.9  9.8  9.4   Total Protein 6.0 - 8.5 g/dL  6.7    Total Bilirubin 0.0 - 1.2 mg/dL  0.3    Alkaline Phos 44 - 121 IU/L  81    AST 0 - 40 IU/L  17    ALT 0 - 32 IU/L  23      Past Medical History:  Diagnosis Date   Anxiety    Bipolar affective disorder, mixed, in partial or remission    Depression  Dysrhythmia    Left sided heart diease   Headache(784.0)    History of gallstones    Kidney stones    Mental disorder    Migraine    Pneumonia    Hx: of as a child   PONV (postoperative nausea and vomiting)    Renal disorder    Sleep apnea    sleep test 2014, did not have osa   Substance abuse (HCC)    Fentanyl, Percocet. last use 04/2011   EGD 05/17/2019: - Benign-appearing esophageal stenosis. - Gastritis. Biopsied. - Normal duodenal bulb and second portion of the duodenum. A. STOMACH, BIOPSY:  - Mild reactive gastropathy.  - Warthin-Starry is negative for Helicobacter pylori.  - No intestinal metaplasia, dysplasia, or malignancy.   Colonoscopy 11/16/2013: COLON FINDINGS: The terminal ileum appeared normal but given colonic findings I biopsied this and sent to pathology. There was erythema, granularity throughout most of the colon. Not clearly inflammed. Biopsies were taken from right colon,  left colon and rectosigmoid. The cecum and rectosigmoid segments were most  erythematous appearing. The examination was otherwise normal.  ENDOSCOPIC IMPRESSION: See above, unclear clinical signficance of the findings: she has been on/off antibiotics a lot recently, perhaps mild or resolving C. diff colitis. No polyps or cancers. Recall colonoscopy in 5 years due to significant family history of colon cancer 1. Surgical [P], terminal ileum, biopsy - BENIGN SMALL BOWEL MUCOSA WITH PROMINENT INTRAMUCOSAL LYMPHOID AGGREGATES. - NO EVIDENCE OF ACTIVE INFLAMMATION, CHRONICITY OR ANY OTHER ABNORMALITIES. 2. Surgical [P], right colon, biopsy - BENIGN COLONIC MUCOSA WITH INTRAMUCOSAL LYMPHOID AGGREGATES. - NO EVIDENCE OF ACTIVE INFLAMMATION, CHRONICITY, DYSPLASIA OR MALIGNANCY. 3. Surgical [P], left colon, biopsy - BENIGN COLONIC MUCOSA. - NO EVIDENCE OF ACTIVE INFLAMMATION, CHRONICITY, DYSPLASIA OR MALIGNANCY. 4. Surgical [P], rectosigmoid, biopsy - ULCERATED COLONIC MUCOSA. - NO EVIDENCE OF ACTIVE INFLAMMATION, CHRONICITY, DYSPLASIA OR MALIGNANCY.  Colonoscopy 10/24/2011: Poor prep  Past Medical History:  Diagnosis Date   Anxiety    Bipolar affective disorder, mixed, in partial or remission    Depression    Dysrhythmia    Left sided heart diease   Headache(784.0)    History of gallstones    Kidney stones    Mental disorder    Migraine    Pneumonia    Hx: of as a child   PONV (postoperative nausea and vomiting)    Renal disorder    Sleep apnea    sleep test 2014, did not have osa   Substance abuse (HCC)    Fentanyl, Percocet. 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  Father has colon cancer   ESOPHAGOGASTRODUODENOSCOPY (EGD) WITH PROPOFOL N/A 05/17/2019   Procedure: ESOPHAGOGASTRODUODENOSCOPY (EGD) WITH PROPOFOL;  Surgeon: Ladene Artist, MD;  Location: [facility];  Service: Endoscopy;  Laterality: N/A;   FOOT SURGERY     right x 2   NASAL SEPTOPLASTY W/ TURBINOPLASTY Bilateral 12/04/2014   Procedure: NASAL SEPTOPLASTY WITH BILATERAL TURBINATE REDUCTION;  Surgeon: [name]  Constance Holster, MD;  Location: [place];  Service: ENT;  Laterality: Bilateral;   TOTAL ABDOMINAL HYSTERECTOMY W/ BILATERAL SALPINGOOPHORECTOMY     VAGINAL HYSTERECTOMY     Social History: She is married.  She has 2 sons and 4 daughters.  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  Non-smoker.  No alcohol use.  No drug use.  Family History: Father was diagnosed with colon cancer in his 43s and he also had breast cancer.  Mother with history of breast, ovarian and uterine cancer.  No Known Allergies    Outpatient Encounter Medications as of 03/05/2022  Medication Sig   albuterol (VENTOLIN HFA) 108 (90 Base) MCG/ACT inhaler INHALE 1 TO 2 PUFFS INTO THE LUNGS EVERY 6 HOURS AS NEEDED FOR WHEEZING OR SHORTNESS OF BREATH   cyclobenzaprine (FLEXERIL) 10 MG tablet Take 10 mg by mouth at bedtime as needed for muscle spasms.    levothyroxine (SYNTHROID) 50 MCG tablet Take 1 tablet (50 mcg total) by mouth daily before breakfast.   lithium carbonate 300 MG capsule Take 2 capsules (600 mg total) by mouth at bedtime.   pantoprazole (PROTONIX) 40 MG tablet Take 1 tablet (40 mg total) by mouth daily at 6 (six) AM.   pregabalin (LYRICA) 100 MG capsule Take 100 mg by mouth 2 (two) times daily.   QUEtiapine (SEROQUEL) 400 MG tablet Take 2 tablets (800 mg total) by mouth at bedtime.   traMADol (ULTRAM) 50 MG tablet Take 100 mg by mouth every 8 (eight) hours as needed.   traZODone (DESYREL) 100 MG tablet  TAKE ONE TO TWO TABLETS BY MOUTH AT BEDTIME AS NEEDED FOR SLEEP   zolpidem (AMBIEN) 10 MG tablet TAKE ONE TABLET BY MOUTH AT BEDTIME AS NEEDED FOR SLEEP   No facility-administered encounter medications on file as of 03/05/2022.     REVIEW OF SYSTEMS:  Gen: Denies fever, sweats or chills. No weight loss.  CV: Denies chest pain, palpitations or edema. Resp: Denies cough, shortness of breath of hemoptysis.  GI: Denies heartburn, dysphagia, stomach or lower abdominal pain. No diarrhea or constipation.  GU : Denies urinary burning, blood in urine, increased urinary frequency or incontinence. MS: Denies joint pain, muscles aches or weakness. Derm: Denies rash, itchiness, skin lesions or unhealing ulcers. Psych: Denies depression, anxiety, memory loss, suicidal ideation and confusion. Heme: Denies bruising, easy bleeding. Neuro:  Denies headaches, dizziness or paresthesias. Endo:  Denies any problems with DM, thyroid or adrenal function.  PHYSICAL EXAM: BP 112/68   Pulse 70   Ht '5\' 6"'$  (1.676 m)   Wt 218 lb (98.9 kg)   BMI 35.19 kg/m  Wt Readings from Last 3 Encounters:  03/05/22 218 lb (98.9 kg)  02/04/22 230 lb (104.3 kg)  01/21/22 237 lb 8 oz (107.7 kg)    General: 56 year old female in no acute distress. Head: Normocephalic and atraumatic. Eyes:  Sclerae non-icteric, conjunctive pink. Ears: Normal auditory acuity. Mouth: Dentition intact. No ulcers or lesions.  Neck: Supple, no lymphadenopathy or thyromegaly.  Lungs: Clear bilaterally to auscultation without wheezes, crackles or rhonchi. Heart: Regular rate and rhythm. No murmur, rub or gallop appreciated.  Abdomen: Soft, nontender, non distended. No masses. No hepatosplenomegaly. Normoactive bowel sounds x 4 quadrants.  Rectal: Deferred. Musculoskeletal: Symmetrical with no gross deformities. Skin: Warm and dry. No rash or lesions on visible extremities. Extremities: Hands are slightly puffy with mild edema.  No lower  extremity edema. Neurological: Alert oriented x 4, no focal deficits.  Psychological:  Alert and cooperative.  Flat affect.  ASSESSMENT AND PLAN:  60) 56 year old female with GERD and a benign esophageal stenosis with recurrent heartburn and dysphagia. -EGD with possible esophageal dilatation benefits and risks discussed including risk with sedation, risk of bleeding, perforation and infection  -Barium swallow study if EGD unrevealing -Instructed patient to take pantoprazole 40 mg once daily on a consistent basis -GERD diet -Patient instructed to avoid large pieces of bread and meat to avoid rice -Patient to contact office if symptoms worsen  2) Colon cancer screening.  Father with history of colon cancer diagnosed in his early 43s.  Colonoscopy 11/2013 without evidence of polyps, recall colonoscopy in 5 years was recommended but was not done. -Colonoscopy benefits and risks discussed including risk with sedation, risk of bleeding, perforation and infection  -2-day bowel prep  3) Chronic constipation -MiraLAX nightly -Consider trial with Linzess if no improvement  4) Mild thrombocytopenia -Recommended CBC to recheck platelet count, patient reported she is scheduled to see her PCP next week and she will have labs drawn at that time -Recommend abdominal sonogram to rule out liver disease/splenomegaly if thrombocytopenia persists  Further recommendations to be determined after the above evaluation completed          CC:  No ref. provider found

## 2022-03-05 NOTE — Patient Instructions (Addendum)
You have been scheduled for an endoscopy and colonoscopy. Please follow the written instructions given to you at your visit today. Please pick up your prep supplies at the pharmacy within the next 1-3 days. If you use inhalers (even only as needed), please bring them with you on the day of your procedure.   We have sent the following medications to your pharmacy for you to pick up at your convenience: Suprep and Pantoprazole 40 mg   Miralax- every night as needed  Recommend CBC to check platelet count with PCP.  Due to recent changes in healthcare laws, you may see the results of your imaging and laboratory studies on MyChart before your provider has had a chance to review them.  We understand that in some cases there may be results that are confusing or concerning to you. Not all laboratory results come back in the same time frame and the provider may be waiting for multiple results in order to interpret others.  Please give [place] 48 hours in order for your provider to thoroughly review all the results before contacting the office for clarification of your results.    Thank you for trusting me with your gastrointestinal care!   Carl Best, CRNP

## 2022-03-06 ENCOUNTER — Encounter: Payer: Medicare (Managed Care) | Admitting: Dietician

## 2022-03-11 ENCOUNTER — Encounter: Payer: Medicare (Managed Care) | Attending: Surgery | Admitting: Dietician

## 2022-03-11 ENCOUNTER — Encounter: Payer: Self-pay | Admitting: Dietician

## 2022-03-11 VITALS — Ht 66.0 in | Wt 218.7 lb

## 2022-03-11 DIAGNOSIS — Z713 Dietary counseling and surveillance: Secondary | ICD-10-CM | POA: Insufficient documentation

## 2022-03-11 DIAGNOSIS — Z7985 Long-term (current) use of injectable non-insulin antidiabetic drugs: Secondary | ICD-10-CM | POA: Diagnosis not present

## 2022-03-11 DIAGNOSIS — Z6835 Body mass index (BMI) 35.0-35.9, adult: Secondary | ICD-10-CM | POA: Diagnosis not present

## 2022-03-11 DIAGNOSIS — E669 Obesity, unspecified: Secondary | ICD-10-CM | POA: Diagnosis present

## 2022-03-11 NOTE — Progress Notes (Signed)
Agree with assessment/plan.  Raj Lopaka Karge, MD [facility] [phone]  

## 2022-03-11 NOTE — Progress Notes (Signed)
Supervised Weight Loss Visit Bariatric Nutrition Education Appt Start Time: 11:03    End Time: 11:25  Planned surgery: Sleeve Gastrectomy  Referral stated Supervised Weight Loss (SWL) visits needed: 6 months 2 out of 6 SWL Appointments    NUTRITION ASSESSMENT   Anthropometrics  Start weight at [facility]: 237.5 lbs (date: 01/21/2022)  Height: 66 in Weight today: 218.7 lbs. BMI: 35.30 kg/m2     Clinical  Medical hx: anxiety, depression, sleep apnea, hx of substance abuse Medications: Seroquel, trazadone, lithium, Ambien, Lyrica, tramadol, cyclobenzaprine, Ozempic  Labs: TSH 13.6 Notable signs/symptoms: skin dry Any previous deficiencies? No  Lifestyle & Dietary Hx  Pt states that the Ozempic is keeping her in check, stating she is not craving the soda and cupcakes. Pt states she gets sick a few days after taking her Ozempic, stating it is like clockwork. Pt fears her blood sugars are bottoming out and needs to drink a Dr. Malachi Bonds. Pt states she just got a blood glucose monitor and can monitor her blood sugars.   Estimated daily fluid intake: 40 oz Supplements: no Current average weekly physical activity: ADLs with work  24-Hr Dietary Recall First Meal: oatmeal Snack: banana Second Meal: can of lintel soup Snack: watermelon or mild cheddar cheese sticks or peanut butter Third Meal: lintle soup or potato or chicken or eggs Snack:   Beverages: water, Gatorade zero, un-sweet tea, skim milk, mini can of Dr. Malachi Bonds a day  Estimated Energy Needs Calories: 1500   NUTRITION DIAGNOSIS  Overweight/obesity ([place]-3.3) related to past poor dietary habits and physical inactivity as evidenced by patient w/ planned sleeve surgery following dietary guidelines for continued weight loss.   NUTRITION INTERVENTION  Nutrition counseling (C-1) and education (E-2) to facilitate bariatric surgery goals.  Reiterated... Why you need complex carbohydrates: Whole grains and other complex carbohydrates  are required to have a healthy diet. Whole grains provide fiber which can help with blood glucose levels and help keep you satiated. Fruits and starchy vegetables provide essential vitamins and minerals required for immune function, eyesight support, brain support, bone density, wound healing and many other functions within the body. According to the current evidenced based 2020-2025 Dietary Guidelines for Americans, complex carbohydrates are part of a healthy eating pattern which is associated with a decreased risk for type 2 diabetes, cancers, and cardiovascular disease.    Pre-Op Goals Reviewed with the Patient Track food and beverage intake (pen and paper, MyFitness Pal, Baritastic app, etc.) Make healthy food choices while monitoring portion sizes Consume 3 meals per day or try to eat every 3-5 hours Avoid concentrated sugars and fried foods Keep sugar & fat in the single digits per serving on food labels Practice CHEWING your food (aim for applesauce consistency) Practice not drinking 15 minutes before, during, and 30 minutes after each meal and snack Avoid all carbonated beverages (ex: soda, sparkling beverages)  Limit caffeinated beverages (ex: coffee, tea, energy drinks) Avoid all sugar-sweetened beverages (ex: regular soda, sports drinks)  Avoid alcohol  Aim for 64-100 ounces of FLUID daily (with at least half of fluid intake being plain water)  Aim for at least 60-80 grams of PROTEIN daily Look for a liquid protein source that contains ?15 g protein and ?5 g carbohydrate (ex: shakes, drinks, shots) Make a list of non-food related activities Physical activity is an important part of a healthy lifestyle so keep it moving! The goal is to reach 150 minutes of exercise per week, including cardiovascular and weight baring activity.  *Goals  that are bolded indicate the pt would like to start working towards these  Advised pt to eat throughout the day, with protein and complex carbohydrates  to prevent low blood sugars and avoid the need to react with sugar sweetened beverage.  Pre-Op Goals Progress & New Goals Continue: avoid concentrated sugars (cup cakes) Continue: avoid sugar-sweetened beverages Continue: use meal ideas handout to help guide healthy food choices for meals and snacks New: track protein intake. New: eat throughout the day, setting alarms to eat small snacks and small meals  Handouts Provided Include  Protein foods list with protein amounts  Learning Style & Readiness for Change Teaching method utilized: Visual & Auditory  Demonstrated degree of understanding via: Teach Back  Readiness Level: preparation Barriers to learning/adherence to lifestyle change: previous habits of sweets  RD's Notes for next Visit  Patient progress toward chosen goals.  MONITORING & EVALUATION Dietary intake, weekly physical activity, body weight, and pre-op goals in 1 month.   Next Steps  Patient is to return to [facility] in one month for next SWL visit.

## 2022-04-03 ENCOUNTER — Ambulatory Visit: Payer: Medicare (Managed Care) | Admitting: Dietician

## 2022-04-04 ENCOUNTER — Ambulatory Visit: Payer: Medicare (Managed Care) | Admitting: Dietician

## 2022-04-30 ENCOUNTER — Telehealth: Payer: Self-pay

## 2022-04-30 ENCOUNTER — Encounter: Payer: Medicare (Managed Care) | Admitting: Gastroenterology

## 2022-04-30 NOTE — Telephone Encounter (Signed)
Called pt to reschedule her appt due to MD scheduling conflict. Pt rescheduled to 06/19/22 at 3pm. Will send new prep instructions via [place]. Pt verbalized understanding and had no further concerns at the end of the call.

## 2022-05-06 ENCOUNTER — Encounter: Payer: Self-pay | Admitting: Physician Assistant

## 2022-05-06 ENCOUNTER — Ambulatory Visit (INDEPENDENT_AMBULATORY_CARE_PROVIDER_SITE_OTHER): Payer: Medicare (Managed Care) | Admitting: Physician Assistant

## 2022-05-06 DIAGNOSIS — F99 Mental disorder, not otherwise specified: Secondary | ICD-10-CM

## 2022-05-06 DIAGNOSIS — F250 Schizoaffective disorder, bipolar type: Secondary | ICD-10-CM

## 2022-05-06 DIAGNOSIS — F5105 Insomnia due to other mental disorder: Secondary | ICD-10-CM | POA: Diagnosis not present

## 2022-05-06 DIAGNOSIS — F411 Generalized anxiety disorder: Secondary | ICD-10-CM | POA: Diagnosis not present

## 2022-05-06 DIAGNOSIS — E032 Hypothyroidism due to medicaments and other exogenous substances: Secondary | ICD-10-CM | POA: Diagnosis not present

## 2022-05-06 DIAGNOSIS — Z79899 Other long term (current) drug therapy: Secondary | ICD-10-CM

## 2022-05-06 MED ORDER — TRAZODONE HCL 100 MG PO TABS: ORAL_TABLET | ORAL | 1 refills | Status: DC

## 2022-05-06 MED ORDER — ZOLPIDEM TARTRATE 10 MG PO TABS: ORAL_TABLET | ORAL | 5 refills | Status: DC

## 2022-05-06 MED ORDER — LITHIUM CARBONATE 300 MG PO CAPS: 600.0000 mg | ORAL_CAPSULE | Freq: Every day | ORAL | 1 refills | Status: DC

## 2022-05-06 MED ORDER — QUETIAPINE FUMARATE 400 MG PO TABS: 800.0000 mg | ORAL_TABLET | Freq: Every day | ORAL | 1 refills | Status: DC

## 2022-05-06 NOTE — Progress Notes (Signed)
[facility] Med Check  Patient ID: [name],  MRN: [id number]  PCP: Pcp, No  Date of Evaluation: 05/06/2022 time spent:30 minutes  Chief Complaint:  Chief Complaint   Follow-up    HISTORY/CURRENT STATUS: HPI For routine med check.  Doing well. Patient is able to enjoy things.  Energy and motivation are good.  She does not work.  No extreme sadness, tearfulness, or feelings of hopelessness.  Sleeps well, takes the trazodone and Ambien every night or else she has trouble falling asleep.  ADLs and personal hygiene are normal.   Denies any changes in concentration, making decisions, or remembering things.  Appetite has not changed.  Weight is stable.  Not having a lot of anxiety.  Denies suicidal or homicidal thoughts.  Patient denies increased energy with decreased need for sleep, increased talkativeness, racing thoughts, impulsivity or risky behaviors, increased spending, increased libido, grandiosity, increased irritability or anger, paranoia, or hallucinations.  Denies dizziness, syncope, seizures, numbness, tingling, tremor, tics, unsteady gait, slurred speech, confusion. Denies muscle or joint pain, stiffness, or dystonia.  Individual Medical History/ Review of Systems: Changes? :Yes    having prophylactic bilateral mastectomy sometime soon.  It is not scheduled yet.  Past medications for mental health diagnoses include: Depakote, Tegretol, BuSpar, lithium, Seroquel, trazodone, Ambien  Allergies: Patient has no known allergies.  Current Medications:  Current Outpatient Medications:    cyclobenzaprine (FLEXERIL) 10 MG tablet, Take 10 mg by mouth at bedtime as needed for muscle spasms. , Disp: , Rfl:    levothyroxine (SYNTHROID) 50 MCG tablet, Take 1 tablet (50 mcg total) by mouth daily before breakfast., Disp: 30 tablet, Rfl: 1   pregabalin (LYRICA) 100 MG capsule, Take 100 mg by mouth 2 (two) times daily., Disp: , Rfl:    traMADol (ULTRAM) 50 MG tablet, Take 100 mg by  mouth every 8 (eight) hours as needed., Disp: , Rfl:    albuterol (VENTOLIN HFA) 108 (90 Base) MCG/ACT inhaler, INHALE 1 TO 2 PUFFS INTO THE LUNGS EVERY 6 HOURS AS NEEDED FOR WHEEZING OR SHORTNESS OF BREATH (Patient not taking: Reported on 05/06/2022), Disp: 18 g, Rfl: 3   lithium carbonate 300 MG capsule, Take 2 capsules (600 mg total) by mouth at bedtime., Disp: 180 capsule, Rfl: 1   pantoprazole (PROTONIX) 40 MG tablet, Take 1 tablet (40 mg total) by mouth daily at 6 (six) AM. (Patient not taking: Reported on 05/06/2022), Disp: 45 tablet, Rfl: 1   pantoprazole (PROTONIX) 40 MG tablet, Take 1 tablet (40 mg total) by mouth daily. (Patient not taking: Reported on 05/06/2022), Disp: 30 tablet, Rfl: 2   QUEtiapine (SEROQUEL) 400 MG tablet, Take 2 tablets (800 mg total) by mouth at bedtime., Disp: 180 tablet, Rfl: 1   traZODone (DESYREL) 100 MG tablet, TAKE ONE TO TWO TABLETS BY MOUTH AT BEDTIME AS NEEDED FOR SLEEP, Disp: 180 tablet, Rfl: 1   zolpidem (AMBIEN) 10 MG tablet, TAKE ONE TABLET BY MOUTH AT BEDTIME AS NEEDED FOR SLEEP, Disp: 30 tablet, Rfl: 5 Medication Side Effects: none  Family Medical/ Social History: Changes? No  MENTAL HEALTH EXAM:  There were no vitals taken for this visit.There is no height or weight on file to calculate BMI.  General Appearance: Casual, Neat, Well Groomed and Obese  Eye Contact:  Good  Speech:  Clear and Coherent and Normal Rate  Volume:  Normal  Mood:  Euthymic  Affect:  Appropriate  Thought Process:  Goal Directed and Descriptions of Associations: Intact  Orientation:  Full (  Time, Place, and Person)  Thought Content: Logical   Suicidal Thoughts:  No  Homicidal Thoughts:  No  Memory:  WNL  Judgement:  Good  Insight:  Good  Psychomotor Activity:  Normal  Concentration:  Concentration: Good and Attention Span: Good  Recall:  Good  Fund of Knowledge: Good  Language: Good  Assets:  Desire for Improvement Financial  Resources/Insurance Housing Transportation  ADL's:  Intact  Cognition: WNL  Prognosis:  Good   Labs  Latest results under Care Everywhere 02/04/2022 TSH 5.7 Total cholesterol 194, HDL 49, triglycerides 120, LDL 124 PCP follows labs Lithium was not ordered.  DIAGNOSES:    ICD-10-CM   1. Schizoaffective disorder, bipolar type ([place])  F25.0 Lithium level    2. Generalized anxiety disorder  F41.1     3. 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 Insomnia due to other mental disorder  F51.05    F99     5. Encounter for long-term (current) use of medications  Z79.899 Lithium level      Receiving Psychotherapy: No    RECOMMENDATIONS:  PDMP was reviewed.  Last Ambien filled 04/09/2022. I provided 30 minutes of face to face time during this encounter, including time spent before and after the visit in records review, medical decision making, counseling pertinent to today's visit, and charting.   She is doing well on current medications so no changes will be made. She has not had a lithium level drawn since June 2023.  We will order that to be drawn sometime in the next 2 to 3 months.  Continue Synthroid,  Continue lithium 300 mg, 2 p.o. nightly. Continue Seroquel 400 mg, 2 p.o. nightly. Continue trazodone 100 mg, 2 p.o. nightly as needed. Continue Ambien 10 mg 1 nightly as needed sleep. Lab ordered as above to be drawn sometime in the next 3 to 4 months. Return in 6 months.  Donnal Moat, PA-C

## 2022-05-16 ENCOUNTER — Ambulatory Visit: Payer: Medicare (Managed Care) | Admitting: Family Medicine

## 2022-05-16 ENCOUNTER — Encounter: Payer: Self-pay | Admitting: Family Medicine

## 2022-05-16 VITALS — BP 122/76 | HR 88 | Temp 97.5°F | Ht 65.75 in | Wt 214.0 lb

## 2022-05-16 DIAGNOSIS — E661 Drug-induced obesity: Secondary | ICD-10-CM | POA: Diagnosis not present

## 2022-05-16 DIAGNOSIS — F250 Schizoaffective disorder, bipolar type: Secondary | ICD-10-CM

## 2022-05-16 DIAGNOSIS — Z23 Encounter for immunization: Secondary | ICD-10-CM | POA: Diagnosis not present

## 2022-05-16 DIAGNOSIS — G894 Chronic pain syndrome: Secondary | ICD-10-CM

## 2022-05-16 DIAGNOSIS — Z01818 Encounter for other preprocedural examination: Secondary | ICD-10-CM

## 2022-05-16 DIAGNOSIS — Z6834 Body mass index (BMI) 34.0-34.9, adult: Secondary | ICD-10-CM

## 2022-05-16 NOTE — Progress Notes (Signed)
Assessment/Plan:  Total time spent caring for the patient today was 120 minutes. This includes time spent before the visit reviewing the chart, time spent during the visit, and time spent after the visit on documentation, etc.  Problem List Items Addressed This Visit       Other   Schizoaffective disorder, bipolar type ([place]) - Primary    Thalia has a history of schizoaffective disorder, bipolar type. She is currently managed with lithium carbonate and quetiapine, which is appropriate for her psychiatric condition.  Plan:  Continue current psychiatric medications. Schedule follow-up appointments as needed for medication evaluation and adjustment.       Chronic pain syndrome    The patient experiences chronic pain following multiple surgeries and is currently managed with tramadol.  Plan:  Continue tramadol for pain management. Evaluate the effectiveness and adjust the medication plan as needed. Follow up with physical therapy or pain clinic for additional pain management strategies.      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  Plan: Consider ordering a fasting glucose and HbA1c test to assess for diabetes at next visit. If diabetes is diagnosed, Ozempic can be considered for weight management as part of diabetes management protocol. In the absence of a diabetes diagnosis, explore other weight management strategies and consider a specialist referral.      Preoperative examination    The patient has multiple medical issues and upcoming surgeries including a potential mastectomy. A pre-operative evaluation may be necessary.  Plan:  Coordinate with surgical teams to determine any required pre-operative work-ups. Order relevant labs and imaging as requested by the surgical team  before operations. Ensure that any cardiovascular risk is assessed if indicated by the surgical team.       There are no discontinued medications.    Subjective:  HPI: Encounter date: 05/16/2022  NIGEL YONG is a 57 y.o. female who has Schizoaffective disorder, bipolar type ([place]); Metatarsalgia, right foot; External hemorrhoids; Nausea with vomiting; Thrombocytopenia, unspecified ([place]); De Quervain's tenosynovitis, left; Bipolar affective disorder, depressed, severe ([place]); Opiate dependence ([place]); Intentional opiate overdose ([place]); Bereavement; Chronic pain syndrome; Gastroesophageal reflux disease without esophagitis; Deviated septum; Mass of arm; Situational anxiety; OAB (overactive bladder); Urgency incontinence; Achilles tendon contracture, right; Aspiration pneumonia ([place]); PNA (pneumonia); Community acquired pneumonia of left lower lobe of lung; Esophageal thickening; Abnormal CT scan, esophagus; Gastritis; Esophageal stenosis; Lumbar degenerative disc disease; Morbid obesity ([place]); 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   She  has a past medical history of Anxiety, Bipolar affective disorder, mixed, in partial or remission, Depression, Dysrhythmia, Headache(784.0), History of gallstones, Kidney stones, Mental disorder, Migraine, Pneumonia, PONV (postoperative nausea and vomiting), Renal disorder, Sleep apnea, and Substance abuse ([place])..   Chief Complaint: Latanya Maudlin presented seeking to establish care, restarting Ozempic, and discussing diabetes testing.   History of Present Illness:  Schizoaffective disorder, bipolar type: Marketa is being treated for Schizoaffective disorder, bipolar type; she is currently prescribed lithium carbonate, quetiapine, and has a history of taking trazodone for sleep.  Chronic Pain: The patient also reports chronic pain due to various past surgeries, most  notably five back surgeries since 2020, and has a regimen that includes tramadol for pain  management.  Obesity: She is seeking to restart Ozempic, which she has previously used for weight management. The patient expressed difficulties in obtaining Ozempic due to insurance requirements for prior authorization, stating a diagnosis of diabetes is necessary.  Diabetes: Nakeira mentions a desire to undergo diabetes testing to potentially qualify for medication to manage her weight, despite no personal history of diabetes, but reports a family history of the condition. Last recorded A1c levels were within non-diabetic ranges.  ROS:  Cardiology: No recent chest pain or palpitations mentioned. Respiratory: No shortness of breath discussed. Gastrointestinal: No specific complaints presented at this time. Reminder of ROS negative.  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  Father has colon cancer   ESOPHAGOGASTRODUODENOSCOPY (EGD) WITH PROPOFOL N/A 05/17/2019   Procedure: ESOPHAGOGASTRODUODENOSCOPY (EGD) WITH PROPOFOL;  Surgeon: Ladene Artist, MD;  [facility];  Service: Endoscopy;  Laterality: N/A;   FOOT SURGERY     right x 2   NASAL SEPTOPLASTY W/ TURBINOPLASTY Bilateral 12/04/2014   Procedure: NASAL SEPTOPLASTY WITH BILATERAL TURBINATE REDUCTION;  Surgeon: Izora Gala, MD;  Location: [facility];  Service: ENT;  Laterality: Bilateral;   TOTAL ABDOMINAL HYSTERECTOMY W/  BILATERAL SALPINGOOPHORECTOMY     VAGINAL HYSTERECTOMY      Outpatient Medications Prior to Visit  Medication Sig Dispense Refill   cyclobenzaprine (FLEXERIL) 10 MG tablet Take 10 mg by mouth at bedtime as needed for muscle spasms.      lithium carbonate 300 MG capsule Take 2 capsules (600 mg total) by mouth at bedtime. 180 capsule 1   pregabalin (LYRICA) 100 MG capsule Take 100 mg by mouth 2 (two) times daily.     QUEtiapine (SEROQUEL) 400 MG tablet Take 2 tablets (800 mg total) by mouth at bedtime. 180 tablet 1   traMADol (ULTRAM) 50 MG tablet Take 100 mg by mouth every 8 (eight) hours as needed.     zolpidem (AMBIEN) 10 MG tablet TAKE ONE TABLET BY MOUTH AT BEDTIME AS NEEDED FOR SLEEP 30 tablet 5   albuterol (VENTOLIN HFA) 108 (90 Base) MCG/ACT inhaler INHALE 1 TO 2 PUFFS INTO THE LUNGS EVERY 6 HOURS AS NEEDED FOR WHEEZING OR SHORTNESS OF BREATH (Patient not taking: Reported on 05/06/2022) 18 g 3   levothyroxine (SYNTHROID) 50 MCG tablet Take 1 tablet (50 mcg total) by mouth daily before breakfast. (Patient not taking: Reported on 05/16/2022) 30 tablet 1   pantoprazole (PROTONIX) 40 MG tablet Take 1 tablet (40 mg total) by mouth daily at 6 (six) AM. (Patient not taking: Reported on 05/06/2022) 45 tablet 1   pantoprazole (PROTONIX) 40 MG tablet Take 1 tablet (40 mg total) by mouth daily. (Patient not taking: Reported on 05/06/2022) 30 tablet 2   traZODone (DESYREL) 100 MG tablet TAKE ONE TO TWO TABLETS BY MOUTH AT BEDTIME AS NEEDED FOR SLEEP (Patient not taking: Reported on 05/16/2022) 180 tablet 1   No facility-administered medications prior  to visit.    Family History  Problem Relation Age of Onset   Colon cancer Father 62   Breast cancer Father    Breast cancer Mother    Uterine cancer Mother    Stomach cancer Neg Hx    Rectal cancer Neg Hx    Esophageal cancer Neg Hx     Social History   Socioeconomic History   Marital status: Married    Spouse name: Not on file   Number of  children: 6   Years of education: Not on file   Highest education level: Not on file  Occupational History   Occupation: nursing student  Tobacco Use   Smoking status: Never    Passive exposure: Never   Smokeless tobacco: Never  Vaping Use   Vaping Use: Never used  Substance and Sexual Activity   Alcohol use: No    Alcohol/week: 0.0 standard drinks of alcohol   Drug use: No    Types: Other-see comments, Fentanyl, Hydrocodone   Sexual activity: Not Currently    Birth control/protection: Surgical  Other Topics Concern   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)   Grew up in [place]   parents retired in [place]   Married 22 years     2 sons ( 61, 70 )   4 daughters ( 87, 1,  66, 11)Smoking Status:  never   Does Patient Exercise:  no   Caffeine use/day:  4-5 beverages daily   Social Determinants of Radio broadcast assistant Strain: Not on file  Food Insecurity: Not on file  Transportation Needs: Not on file  Physical Activity: Not on file  Stress: Not on file  Social Connections: Not on file  Intimate Partner Violence: Not on file                                                                                                 Objective:  Physical Exam: BP 122/76 (BP Location: Left Arm, Patient Position: Sitting, Cuff Size: Large)   Pulse 88   Temp (!) 97.5 F (36.4 C) (Temporal)   Ht 5' 5.75" (1.67 m)   Wt 214 lb (97.1 kg)   SpO2 100%   BMI 34.80 kg/m    General: No acute distress. Awake and conversant.  Eyes: Normal conjunctiva, anicteric. Round symmetric pupils.  ENT: Hearing grossly intact. No nasal discharge.  Neck: Neck is supple. No masses or thyromegaly.  Respiratory: Respirations are non-labored. No auditory wheezing.  Skin: Warm. No rashes or ulcers.  CV: No cyanosis or JVD MSK: Normal ambulation. No clubbing  Neuro: Sensation and CN II-XII grossly normal.       Alesia Banda, MD, MS

## 2022-05-16 NOTE — Patient Instructions (Signed)
Preoperative testing and weight management: Please follow-up after surgical consult for for breast surgery.  We can do preoperative testing and then based on that we can determine next steps for weight management pending test results.

## 2022-05-18 DIAGNOSIS — Z01818 Encounter for other preprocedural examination: Secondary | ICD-10-CM | POA: Insufficient documentation

## 2022-05-18 DIAGNOSIS — Z6834 Body mass index (BMI) 34.0-34.9, adult: Secondary | ICD-10-CM | POA: Insufficient documentation

## 2022-05-18 NOTE — Assessment & Plan Note (Signed)
The patient experiences chronic pain following multiple surgeries and is currently managed with tramadol.  Plan:  Continue tramadol for pain management. Evaluate the effectiveness and adjust the medication plan as needed. Follow up with physical therapy or pain clinic for additional pain management strategies.

## 2022-05-18 NOTE — Assessment & Plan Note (Signed)
The patient has multiple medical issues and upcoming surgeries including a potential mastectomy. A pre-operative evaluation may be necessary.  Plan:  Coordinate with surgical teams to determine any required pre-operative work-ups. Order relevant labs and imaging as requested by the surgical team before operations. Ensure that any cardiovascular risk is assessed if indicated by the surgical team.

## 2022-05-18 NOTE — Assessment & Plan Note (Signed)
Amzie seeks to restart Ozempic for weight management and needs to address the insurance hurdle requiring a diagnosis of diabetes for medication coverage.  Plan: Consider ordering a fasting glucose and HbA1c test to assess for diabetes at next visit. If diabetes is diagnosed, Ozempic can be considered for weight management as part of diabetes management protocol. In the absence of a diabetes diagnosis, explore other weight management strategies and consider a specialist referral.

## 2022-05-18 NOTE — Assessment & Plan Note (Signed)
[name] has a history of schizoaffective disorder, bipolar type. She is currently managed with lithium carbonate and quetiapine, which is appropriate for her psychiatric condition.  Plan:  Continue current psychiatric medications. Schedule follow-up appointments as needed for medication evaluation and adjustment.

## 2022-06-04 ENCOUNTER — Ambulatory Visit (INDEPENDENT_AMBULATORY_CARE_PROVIDER_SITE_OTHER): Payer: Medicare (Managed Care) | Admitting: Internal Medicine

## 2022-06-04 ENCOUNTER — Encounter: Payer: Self-pay | Admitting: Internal Medicine

## 2022-06-04 VITALS — BP 122/74 | HR 82 | Ht 65.75 in | Wt 211.0 lb

## 2022-06-04 DIAGNOSIS — E039 Hypothyroidism, unspecified: Secondary | ICD-10-CM | POA: Diagnosis not present

## 2022-06-04 LAB — T4, FREE: Free T4: 0.64 ng/dL (ref 0.60–1.60)

## 2022-06-04 LAB — TSH: TSH: 6.79 u[IU]/mL — ABNORMAL HIGH (ref 0.35–5.50)

## 2022-06-04 NOTE — Patient Instructions (Signed)

## 2022-06-04 NOTE — Progress Notes (Signed)
Name: [name]  MRN/ DOB: [id number], 27-Jun-1965    Age/ Sex: 57 y.o., female    PCP: Garnette Gunner, MD   Reason for Endocrinology Evaluation: Hypothyroidism     Date of Initial Endocrinology Evaluation: 06/04/2022     HPI: [name] is a 57 y.o. female with a past medical history of thrombocytopenia, opiate dependence, bipolar affective disorder. The patient presented for initial endocrinology clinic visit on 06/04/2022 for consultative assistance with her Hypothyroidism.   Pt has been diagnosed with hypothyroidism since 2021.  Of note, the pt has been on lithium since 2005 and follows with Psychiatry   She was prescribed levothyroxine at some point but has not taken it in a while because she believes she is on enough medications and does not want to add more   She is accompanied by her husband today Paul  Stable local neck swelling  Denies palpitations  Denies tremors  Denies constipation or diarrhea   No Biotin  NO XRT or anterior neck sx    She has not been on levothyroxine  for years   No Fh of thyroid disease   She is on a compounded semaglutide that she gets online     HISTORY:  Past Medical History:  Past Medical History:  Diagnosis Date   Anxiety    Bipolar affective disorder, mixed, in partial or remission    Depression    Dysrhythmia    Left sided heart diease   Headache(784.0)    History of gallstones    Kidney stones    Mental disorder    Migraine    Pneumonia    Hx: of as a child   PONV (postoperative nausea and vomiting)    Renal disorder    Sleep apnea    sleep test 2014, did not have osa   Substance abuse ([facility])    Fentanyl, Percocet. last use 04/2011   Past Surgical History:  Past Surgical History:  Procedure Laterality Date   ABDOMINAL HYSTERECTOMY     AMPUTATION Right 09/15/2012   Procedure: AMPUTATION DIGIT- right ;  Surgeon: Nadara Mustard, MD;  [facility];  Service: Orthopedics;  Laterality: Right;  Right  Great Toe Amputation at MTP (metatarsophalangeal)   BACK SURGERY  12/2021   BACK SURGERY  06/2021   BIOPSY  05/17/2019   Procedure: BIOPSY;  Surgeon: Meryl Dare, MD;  [facility];  Service: Endoscopy;;   BUNIONECTOMY     11/14/2011   CHOLECYSTECTOMY     COLONOSCOPY     2001, 2008.  Father has colon cancer   ESOPHAGOGASTRODUODENOSCOPY (EGD) WITH PROPOFOL N/A 05/17/2019   Procedure: ESOPHAGOGASTRODUODENOSCOPY (EGD) WITH PROPOFOL;  Surgeon: Meryl Dare, MD;  [facility];  Service: Endoscopy;  Laterality: N/A;   FOOT SURGERY     right x 2   NASAL SEPTOPLASTY W/ TURBINOPLASTY Bilateral 12/04/2014   Procedure: NASAL SEPTOPLASTY WITH BILATERAL TURBINATE REDUCTION;  Surgeon: Serena Colonel, MD;  Location: [facility];  Service: ENT;  Laterality: Bilateral;   TOTAL ABDOMINAL HYSTERECTOMY W/ BILATERAL SALPINGOOPHORECTOMY     VAGINAL HYSTERECTOMY      Social History:  reports that she has never smoked. She has never been exposed to tobacco smoke. She has never used smokeless tobacco. She reports that she does not drink alcohol and does not use drugs. Family History: family history includes Breast cancer in her father and mother; Colon cancer (age of onset: 63) in her father;  Uterine cancer in her mother.   HOME MEDICATIONS: Allergies as of 06/04/2022   No Known Allergies      Medication List        Accurate as of June 04, 2022  1:06 PM. If you have any questions, ask your nurse or doctor.          STOP taking these medications    pantoprazole 40 MG tablet Commonly known as: PROTONIX Stopped by: Scarlette Shorts, MD       TAKE these medications    albuterol 108 (90 Base) MCG/ACT inhaler Commonly known as: VENTOLIN HFA INHALE 1 TO 2 PUFFS INTO THE LUNGS EVERY 6 HOURS AS NEEDED FOR WHEEZING OR SHORTNESS OF BREATH   cyclobenzaprine 10 MG tablet Commonly known as: FLEXERIL Take 10 mg by mouth at bedtime as needed for muscle  spasms.   levothyroxine 50 MCG tablet Commonly known as: SYNTHROID Take 1 tablet (50 mcg total) by mouth daily before breakfast.   lithium carbonate 300 MG capsule Take 2 capsules (600 mg total) by mouth at bedtime.   Ozempic (2 MG/DOSE) 8 MG/3ML Sopn Generic drug: Semaglutide (2 MG/DOSE) Inject 2 mg into the skin once a week.   pregabalin 100 MG capsule Commonly known as: LYRICA Take 100 mg by mouth 2 (two) times daily.   QUEtiapine 400 MG tablet Commonly known as: SEROQUEL Take 2 tablets (800 mg total) by mouth at bedtime.   traMADol 50 MG tablet Commonly known as: ULTRAM Take 100 mg by mouth every 8 (eight) hours as needed.   traZODone 100 MG tablet Commonly known as: DESYREL TAKE ONE TO TWO TABLETS BY MOUTH AT BEDTIME AS NEEDED FOR SLEEP   zolpidem 10 MG tablet Commonly known as: AMBIEN TAKE ONE TABLET BY MOUTH AT BEDTIME AS NEEDED FOR SLEEP          REVIEW OF SYSTEMS: A comprehensive ROS was conducted with the patient and is negative except as per HPI    OBJECTIVE:  VS: BP 122/74 (BP Location: Right Arm, Patient Position: Sitting, Cuff Size: Large)   Pulse 82   Ht 5' 5.75" (1.67 m)   Wt 211 lb (95.7 kg)   SpO2 95%   BMI 34.32 kg/m    Wt Readings from Last 3 Encounters:  06/04/22 211 lb (95.7 kg)  05/16/22 214 lb (97.1 kg)  03/11/22 218 lb 11.2 oz (99.2 kg)     EXAM: General: Pt appears well and is in NAD  Neck: General: Supple without adenopathy. Thyroid: Thyroid size normal.  No goiter or nodules appreciated.   Lungs: Clear with good BS bilat with no rales, rhonchi, or wheezes  Heart: Auscultation: RRR.  Extremities:  BL LE: No pretibial edema normal ROM and strength.  Mental Status: Judgment, insight: Intact Orientation: Oriented to time, place, and person Mood and affect: No depression, anxiety, or agitation     DATA REVIEWED:  Latest Reference Range & Units 06/04/22 13:25  TSH 0.35 - 5.50 uIU/mL 6.79 (H)  T4,Free(Direct) 0.60 - 1.60  ng/dL 2.95  (H): Data is abnormally high    Thyroid ultrasound 03/11/2018 Diffusely enlarged, heterogeneous and lobular thyroid gland. No distinct thyroid nodules can be identified separate from the background thyroid parenchyma.   IMPRESSION: Diffusely heterogeneous, enlarged and lobular thyroid gland without discrete thyroid nodules.   ASSESSMENT/PLAN/RECOMMENDATIONS:   Hypothyroidism :  - This is most likely attributed to lithium intake  - Discussed pathophysiology of hypothyroidism, discussed effects of uncontrolled hypothyroidism such as weight gain, fatigue,  constipation as well as myxedema in severe cases  -The patient is very skeptical about taking LT-for replacement, given that her TSH is trending down and her free T4 has normalized, we have opted to monitor at this time without starting any levothyroxine  F/U in 4 months  Signed electronically by: Lyndle Herrlich, MD  [facility] Endocrinology  [facility] [address] [place], [place] [address] Phone: [phone] FAX: [phone]   CC: Garnette Gunner, MD [address] [place] [address] Phone: [phone] Fax: [phone]   Return to Endocrinology clinic as below: Future Appointments  Date Time Provider Department Center  06/19/2022  3:00 PM Lynann Bologna, MD [facility]  11/04/2022 11:00 AM Cherie Ouch, PA-C [facility] None

## 2022-06-19 ENCOUNTER — Encounter: Payer: Self-pay | Admitting: Gastroenterology

## 2022-06-19 ENCOUNTER — Ambulatory Visit (AMBULATORY_SURGERY_CENTER): Payer: Medicare (Managed Care) | Admitting: Gastroenterology

## 2022-06-19 VITALS — BP 121/73 | HR 77 | Temp 98.0°F | Resp 14 | Ht 66.0 in | Wt 218.0 lb

## 2022-06-19 DIAGNOSIS — K2990 Gastroduodenitis, unspecified, without bleeding: Secondary | ICD-10-CM | POA: Diagnosis not present

## 2022-06-19 DIAGNOSIS — K222 Esophageal obstruction: Secondary | ICD-10-CM | POA: Diagnosis not present

## 2022-06-19 DIAGNOSIS — Z800 Family history of malignant neoplasm of digestive organs: Secondary | ICD-10-CM

## 2022-06-19 DIAGNOSIS — Z1211 Encounter for screening for malignant neoplasm of colon: Secondary | ICD-10-CM

## 2022-06-19 DIAGNOSIS — K2950 Unspecified chronic gastritis without bleeding: Secondary | ICD-10-CM | POA: Diagnosis not present

## 2022-06-19 DIAGNOSIS — K2970 Gastritis, unspecified, without bleeding: Secondary | ICD-10-CM

## 2022-06-19 MED ORDER — SODIUM CHLORIDE 0.9 % IV SOLN: 500.0000 mL | INTRAVENOUS | Status: DC

## 2022-06-19 NOTE — Progress Notes (Signed)
Called to room to assist during endoscopic procedure.  Patient ID and intended procedure confirmed with present staff. Received instructions for my participation in the procedure from the performing physician.  

## 2022-06-19 NOTE — Progress Notes (Signed)
Pt. states no medical or surgical changes since previsit or office visit. 

## 2022-06-19 NOTE — Op Note (Addendum)
[facility] Patient Name: [name] Procedure Date: 06/19/2022 2:35 PM MRN: [id number] Endoscopist: Jackquline Denmark , MD, [id number] Age: 57 Referring MD:  Date of Birth: [birth year] Gender: Female Account #: [id number] Procedure:                Colonoscopy Indications:              Screening in patient at increased risk: Colorectal                            cancer in father before age 59 Medicines:                Monitored Anesthesia Care Procedure:                Pre-Anesthesia Assessment:                           - Prior to the procedure, a History and Physical                            was performed, and patient medications and                            allergies were reviewed. The patient's tolerance of                            previous anesthesia was also reviewed. The risks                            and benefits of the procedure and the sedation                            options and risks were discussed with the patient.                            All questions were answered, and informed consent                            was obtained. Prior Anticoagulants: The patient has                            taken no anticoagulant or antiplatelet agents. ASA                            Grade Assessment: III - A patient with severe                            systemic disease. After reviewing the risks and                            benefits, the patient was deemed in satisfactory                            condition to undergo the procedure.  After obtaining informed consent, the colonoscope                            was passed under direct vision. Throughout the                            procedure, the patient's blood pressure, pulse, and                            oxygen saturations were monitored continuously. The                            Olympus CF-HQ190L [id number]) Colonoscope was                            introduced through the anus with  the intention of                            advancing to the cecum. The scope was advanced to                            the ascending colon before the procedure was                            aborted despite abdominal pressure and turning                            patient from side to the back. Medications were                            given. The colonoscopy was performed with moderate                            difficulty due to inadequate bowel prep,                            significant looping, a tortuous colon and the                            patient's body habitus. The patient tolerated the                            procedure well. The quality of the bowel                            preparation was poor. No anatomical landmarks were                            photographed. Scope In: 2:54:20 PM Scope Out: 3:17:51 PM Total Procedure Duration: 0 hours 23 minutes 31 seconds  Findings:                 A few small-mouthed diverticula were found in the  sigmoid colon.                           Non-bleeding internal hemorrhoids were found during                            retroflexion. The hemorrhoids were small.                           The exam was otherwise without abnormality. Highly                            torturous colon. Lesions could have been missed                            because of poor preparation. Complications:            No immediate complications. Estimated Blood Loss:     Estimated blood loss: none. Impression:               - Preparation of the colon was poor.                           - Diverticulosis in the sigmoid colon.                           - Non-bleeding internal hemorrhoids.                           - The examination was otherwise grossly normal to                            ascending colon. Limited exam.                           - No specimens collected. Recommendation:           - Patient has a contact number  available for                            emergencies. The signs and symptoms of potential                            delayed complications were discussed with the                            patient. Return to normal activities tomorrow.                            Written discharge instructions were provided to the                            patient.                           - Resume previous diet.                           -  Miralax 1 capful (17 grams) in 8 ounces of water                            PO daily.                           - Repeat colonoscopy for screening purposes with 2                            day prep (with peds scope) or CT colonoscopy.                            Approach to be determined on follow-up visit.                           - FU with [facility] 6 to 8 weeks                           - The findings and recommendations were discussed                            with the patient's family. Jackquline Denmark, MD 06/19/2022 3:25:53 PM This report has been signed electronically.

## 2022-06-19 NOTE — Progress Notes (Signed)
CHIEF COMPLAINT: Difficulty swallowing, schedule an upper endoscopy and colonoscopy   HISTORY OF PRESENT ILLNESS: [name] is a 57 year old female with a past medical history of Zaidi, depression, bipolar disorder, kidney stones, migraine headaches and esophageal stenosis.  She presents today to schedule a screening colonoscopy and upper endoscopy.  She is accompanied by her husband.  She endorses having difficulty swallowing, she describes food such as [employer] sandwiches and rice gets stuck to the mid esophagus results and vomiting out the stuck food and occurs once or twice monthly or less the past few months.  She describes feeling if she is having esophageal spasm during these episodes as well. She has heartburn 3 to 4 days weekly.  She takes Protonix on the day she has heartburn.  She denies having any upper or lower abdominal pain.  Her most recent EGD was 05/17/2019 which identified a benign-appearing esophageal stenosis and gastritis.  Biopsies were negative for H. pylori.  She is passing a formed bowel movement once or twice weekly.  No rectal bleeding or black stools.  She takes Dulcolax every third night if she has not passed a bowel movement in 3 days and she uses a water enema every 4 to 5 days as needed.  She started Ozempic 1 and half months ago for weight loss and has lost about 20 pounds.  She underwent a colonoscopy 11/16/2013 showed erythema, granularity throughout most of the colon, no polyps.  Biopsies of the rectosigmoid colon showed ulcerated colonic mucosa without active inflammation, dysplasia or malignancy.  The remainder of the colon biopsies were negative for active inflammation in the TI biopsy showed prominent intramucosal lymphoid aggregates.  Both parents had breast cancer and she is under evaluation at [facility] to have a future elective bilateral mastectomy.       Latest Ref Rng & Units 09/10/2021   11:38 AM 01/24/2021   10:55 AM 02/20/2020    1:51 PM  CBC  WBC 3.4 - 10.8  x10E3/uL 3.7  3.5  4.2   Hemoglobin 11.1 - 15.9 g/dL 13.2  13.0  12.2   Hematocrit 34.0 - 46.6 % 39.8  39.8  35.7   Platelets 150 - 450 x10E3/uL 139  144  151.0         Latest Ref Rng & Units 09/10/2021   11:38 AM 01/24/2021   10:55 AM 02/20/2020    1:51 PM  CMP  Glucose 70 - 99 mg/dL 98  97  117   BUN 6 - 24 mg/dL 16  14  13    Creatinine 0.57 - 1.00 mg/dL 0.92  0.90  0.97   Sodium 134 - 144 mmol/L 140  142  141   Potassium 3.5 - 5.2 mmol/L 3.9  4.5  3.7   Chloride 96 - 106 mmol/L 103  106  107   CO2 20 - 29 mmol/L 23  24  25    Calcium 8.7 - 10.2 mg/dL 9.9  9.8  9.4   Total Protein 6.0 - 8.5 g/dL   6.7     Total Bilirubin 0.0 - 1.2 mg/dL   0.3     Alkaline Phos 44 - 121 IU/L   81     AST 0 - 40 IU/L   17     ALT 0 - 32 IU/L   23           Past Medical History:  Diagnosis Date   Anxiety     Bipolar affective disorder, mixed, in partial  or remission     Depression     Dysrhythmia      Left sided heart diease   Headache(784.0)     History of gallstones     Kidney stones     Mental disorder     Migraine     Pneumonia      Hx: of as a child   PONV (postoperative nausea and vomiting)     Renal disorder     Sleep apnea      sleep test 2014, did not have osa   Substance abuse (HCC)      Fentanyl, Percocet. 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 - Gastritis. Biopsied. - Normal duodenal bulb and second portion of the duodenum. A. STOMACH, BIOPSY:  - Mild reactive gastropathy.  - Warthin-Starry is negative for Helicobacter pylori.  - No intestinal metaplasia, dysplasia, or malignancy.    Colonoscopy 11/16/2013: COLON FINDINGS: The terminal ileum appeared normal but given colonic findings I biopsied this and sent to pathology. There was erythema, granularity throughout most of the colon. Not clearly inflammed. Biopsies were taken from right colon,  left colon and rectosigmoid. The cecum and rectosigmoid segments were most erythematous  appearing. The examination was otherwise normal.  ENDOSCOPIC IMPRESSION: See above, unclear clinical signficance of the findings: she has been on/off antibiotics a lot recently, perhaps mild or resolving C. diff colitis. No polyps or cancers. Recall colonoscopy in 5 years due to significant family history of colon cancer 1. Surgical [P], terminal ileum, biopsy - BENIGN SMALL BOWEL MUCOSA WITH PROMINENT INTRAMUCOSAL LYMPHOID AGGREGATES. - NO EVIDENCE OF ACTIVE INFLAMMATION, CHRONICITY OR ANY OTHER ABNORMALITIES. 2. Surgical [P], right colon, biopsy - BENIGN COLONIC MUCOSA WITH INTRAMUCOSAL LYMPHOID AGGREGATES. - NO EVIDENCE OF ACTIVE INFLAMMATION, CHRONICITY, DYSPLASIA OR MALIGNANCY. 3. Surgical [P], left colon, biopsy - BENIGN COLONIC MUCOSA. - NO EVIDENCE OF ACTIVE INFLAMMATION, CHRONICITY, DYSPLASIA OR MALIGNANCY. 4. Surgical [P], rectosigmoid, biopsy - ULCERATED COLONIC MUCOSA. - NO EVIDENCE OF ACTIVE INFLAMMATION, CHRONICITY, DYSPLASIA OR MALIGNANCY.   Colonoscopy 10/24/2011: Poor prep       Past Medical History:  Diagnosis Date   Anxiety     Bipolar affective disorder, mixed, in partial or remission     Depression     Dysrhythmia      Left sided heart diease   Headache(784.0)     History of gallstones     Kidney stones     Mental disorder     Migraine     Pneumonia      Hx: of as a child   PONV (postoperative nausea and vomiting)     Renal disorder     Sleep apnea      sleep test 2014, did not have osa   Substance abuse (HCC)      Fentanyl, Percocet. 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  Father has colon cancer   ESOPHAGOGASTRODUODENOSCOPY (EGD) WITH PROPOFOL N/A 05/17/2019    Procedure: ESOPHAGOGASTRODUODENOSCOPY (EGD) WITH PROPOFOL;  Surgeon: Ladene Artist, MD;  Location: [facility];  Service: Endoscopy;  Laterality: N/A;   FOOT SURGERY        right x 2   NASAL SEPTOPLASTY W/ TURBINOPLASTY Bilateral 12/04/2014    Procedure: NASAL SEPTOPLASTY WITH BILATERAL TURBINATE REDUCTION;  Surgeon: Izora Gala, MD;  Location: [facility];  Service: ENT;  Laterality: Bilateral;   TOTAL ABDOMINAL HYSTERECTOMY W/ BILATERAL SALPINGOOPHORECTOMY       VAGINAL HYSTERECTOMY        Social History: She is married.  She has 2 sons and 4 daughters.  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  Non-smoker.  No alcohol use.  No drug use.   Family History: Father was diagnosed with colon cancer in his 48s and he also had breast cancer.  Mother with history of breast, ovarian and uterine cancer.   No Known Allergies         Outpatient Encounter Medications as of 03/05/2022  Medication Sig   albuterol (VENTOLIN HFA) 108 (90 Base) MCG/ACT inhaler INHALE 1 TO 2 PUFFS INTO THE LUNGS EVERY 6 HOURS AS NEEDED FOR WHEEZING OR SHORTNESS OF BREATH   cyclobenzaprine (FLEXERIL) 10 MG tablet Take 10 mg by mouth at bedtime as needed for muscle spasms.    levothyroxine (SYNTHROID) 50 MCG tablet Take 1 tablet (50 mcg total) by mouth daily before breakfast.   lithium carbonate 300 MG capsule Take 2 capsules (600 mg total) by mouth at bedtime.   pantoprazole (PROTONIX) 40 MG tablet Take 1 tablet (40 mg total) by mouth daily at 6 (six) AM.   pregabalin (LYRICA) 100 MG capsule Take 100 mg by mouth 2 (two) times daily.   QUEtiapine (SEROQUEL) 400 MG tablet Take 2 tablets (800 mg total) by mouth at bedtime.   traMADol (ULTRAM) 50 MG tablet Take 100 mg by mouth every 8 (eight) hours  as needed.   traZODone (DESYREL) 100 MG tablet TAKE ONE TO TWO TABLETS BY MOUTH AT BEDTIME AS NEEDED FOR SLEEP   zolpidem (AMBIEN) 10 MG tablet TAKE ONE TABLET BY MOUTH AT BEDTIME AS NEEDED FOR SLEEP    No facility-administered encounter medications on file as of 03/05/2022.        REVIEW OF SYSTEMS:  Gen: Denies fever, sweats or chills. No weight loss.  CV: Denies chest pain, palpitations or edema. Resp: Denies cough, shortness of breath of hemoptysis.  GI: Denies heartburn, dysphagia, stomach or lower abdominal pain. No diarrhea or constipation.  GU : Denies urinary burning, blood in urine, increased urinary frequency or incontinence. MS: Denies joint pain, muscles aches or weakness. Derm: Denies rash, itchiness, skin lesions or unhealing ulcers. Psych: Denies depression, anxiety, memory loss, suicidal ideation and confusion. Heme: Denies bruising, easy bleeding. Neuro:  Denies headaches, dizziness or paresthesias. Endo:  Denies any problems with DM, thyroid or adrenal function.   PHYSICAL EXAM: BP 112/68   Pulse 70   Ht 5\' 6"  (1.676 m)   Wt 218 lb (98.9 kg)   BMI 35.19 kg/m     Wt Readings from Last 3 Encounters:  03/05/22 218 lb (98.9 kg)  02/04/22 230 lb (104.3 kg)  01/21/22 237 lb 8 oz (107.7 kg)    General: 57 year old female in no acute distress. Head: Normocephalic and atraumatic. Eyes:  Sclerae non-icteric, conjunctive pink. Ears: Normal auditory acuity. Mouth:  Dentition intact. No ulcers or lesions.  Neck: Supple, no lymphadenopathy or thyromegaly.  Lungs: Clear bilaterally to auscultation without wheezes, crackles or rhonchi. Heart: Regular rate and rhythm. No murmur, rub or gallop appreciated.  Abdomen: Soft, nontender, non distended. No masses. No hepatosplenomegaly. Normoactive bowel sounds x 4 quadrants.  Rectal: Deferred. Musculoskeletal: Symmetrical with no gross deformities. Skin: Warm and dry. No rash or lesions on visible extremities. Extremities:  Hands are slightly puffy with mild edema.  No lower extremity edema. Neurological: Alert oriented x 4, no focal deficits.  Psychological:  Alert and cooperative.  Flat affect.   ASSESSMENT AND PLAN:   55) 57 year old female with GERD and a benign esophageal stenosis with recurrent heartburn and dysphagia. -EGD with possible esophageal dilatation benefits and risks discussed including risk with sedation, risk of bleeding, perforation and infection  -Barium swallow study if EGD unrevealing -Instructed patient to take pantoprazole 40 mg once daily on a consistent basis -GERD diet -Patient instructed to avoid large pieces of bread and meat to avoid rice -Patient to contact office if symptoms worsen   2) Colon cancer screening.  Father with history of colon cancer diagnosed in his early 22s.  Colonoscopy 11/2013 without evidence of polyps, recall colonoscopy in 5 years was recommended but was not done. -Colonoscopy benefits and risks discussed including risk with sedation, risk of bleeding, perforation and infection  -2-day bowel prep   3) Chronic constipation -MiraLAX nightly -Consider trial with Linzess if no improvement   4) Mild thrombocytopenia -Recommended CBC to recheck platelet count, patient reported she is scheduled to see her PCP next week and she will have labs drawn at that time -Recommend abdominal sonogram to rule out liver disease/splenomegaly if thrombocytopenia persists   Further recommendations to be determined after the above evaluation completed     Attending physician's note   I have taken history, reviewed the chart and examined the patient. I performed a substantive portion of this encounter, including complete performance of at least one of the key components, in conjunction with the APP. I agree with the Advanced Practitioner's note, impression and recommendations.    For EGD/colon today   Carmell Austria, MD [facility] [phone]

## 2022-06-19 NOTE — Progress Notes (Signed)
Vss nad trans to pacu 

## 2022-06-19 NOTE — Op Note (Signed)
[facility] Patient Name: [name] Procedure Date: 06/19/2022 2:36 PM MRN: [id number] Endoscopist: Jackquline Denmark , MD, [id number] Age: 57 Referring MD:  Date of Birth: 1965/11/24 Gender: Female Account #: [id number] Procedure:                Upper GI endoscopy Indications:              Dysphagia Medicines:                Monitored Anesthesia Care Procedure:                Pre-Anesthesia Assessment:                           - Prior to the procedure, a History and Physical                            was performed, and patient medications and                            allergies were reviewed. The patient's tolerance of                            previous anesthesia was also reviewed. The risks                            and benefits of the procedure and the sedation                            options and risks were discussed with the patient.                            All questions were answered, and informed consent                            was obtained. Prior Anticoagulants: The patient has                            taken no anticoagulant or antiplatelet agents. ASA                            Grade Assessment: III - A patient with severe                            systemic disease. After reviewing the risks and                            benefits, the patient was deemed in satisfactory                            condition to undergo the procedure.                           After obtaining informed consent, the endoscope was  passed under direct vision. Throughout the                            procedure, the patient's blood pressure, pulse, and                            oxygen saturations were monitored continuously. The                            Olympus Scope [id number] was introduced through the                            mouth, and advanced to the second part of duodenum.                            The upper GI endoscopy was accomplished  without                            difficulty. The patient tolerated the procedure                            well. Scope In: Scope Out: Findings:                 One benign-appearing, intrinsic mild stenosis was                            found 35 cm from the incisors. This stenosis                            measured 1.4 cm (inner diameter) x 1 cm (in                            length). The stenosis was traversed. Biopsies were                            obtained from the proximal and distal esophagus                            with cold forceps for histology of suspected                            eosinophilic esophagitis. The scope was withdrawn.                            Dilation was performed with a Maloney dilator with                            mild resistance at 52 Fr.                           Diffuse moderate inflammation characterized by                            erythema, friability and  granularity was found in                            the entire examined stomach. Biopsies were taken                            with a cold forceps for histology.                           A small amount of food (residue) was found in the                            gastric body. No outlet obstruction. This did limit                            the exam to some extent.                           The examined duodenum was normal. Complications:            No immediate complications. Estimated Blood Loss:     Estimated blood loss: none. Impression:               - Benign-appearing esophageal stenosis. Dilated.                           - Gastritis. Biopsied. Recommendation:           - Patient has a contact number available for                            emergencies. The signs and symptoms of potential                            delayed complications were discussed with the                            patient. Return to normal activities tomorrow.                            Written discharge  instructions were provided to the                            patient.                           - Post dilatation diet.                           - Continue present medications including Protonix                            40 mg p.o. daily.                           - Await pathology results.                           -  The findings and recommendations were discussed                            with the patient's family. Jackquline Denmark, MD 06/19/2022 2:52:24 PM This report has been signed electronically.

## 2022-06-19 NOTE — Patient Instructions (Addendum)
Handouts Provided:  Post dilation diet  Continue present medications including Protonix 40 mg daily.  Miralax 1 capful (17 grams) in 8 ounces of water daily.  Repeat colonoscopy will be determined ot follow-up visit, please see scheduled appointment.  YOU HAD AN ENDOSCOPIC PROCEDURE TODAY AT [facility]:   Refer to the procedure report that was given to you for any specific questions about what was found during the examination.  If the procedure report does not answer your questions, please call your gastroenterologist to clarify.  If you requested that your care partner not be given the details of your procedure findings, then the procedure report has been included in a sealed envelope for you to review at your convenience later.  YOU SHOULD EXPECT: Some feelings of bloating in the abdomen. Passage of more gas than usual.  Walking can help get rid of the air that was put into your GI tract during the procedure and reduce the bloating. If you had a lower endoscopy (such as a colonoscopy or flexible sigmoidoscopy) you may notice spotting of blood in your stool or on the toilet paper. If you underwent a bowel prep for your procedure, you may not have a normal bowel movement for a few days.  Please Note:  You might notice some irritation and congestion in your nose or some drainage.  This is from the oxygen used during your procedure.  There is no need for concern and it should clear up in a day or so.  SYMPTOMS TO REPORT IMMEDIATELY:  Following lower endoscopy (colonoscopy or flexible sigmoidoscopy):  Excessive amounts of blood in the stool  Significant tenderness or worsening of abdominal pains  Swelling of the abdomen that is new, acute  Fever of 100F or higher  Following upper endoscopy (EGD)  Vomiting of blood or coffee ground material  New chest pain or pain under the shoulder blades  Painful or persistently difficult swallowing  New shortness of breath  Fever of 100F  or higher  Black, tarry-looking stools  For urgent or emergent issues, a gastroenterologist can be reached at any hour by calling [phone]. Do not use MyChart messaging for urgent concerns.    DIET:  See Post Dilation Diet provided.  Drink plenty of fluids but you should avoid alcoholic beverages for 24 hours.  ACTIVITY:  You should plan to take it easy for the rest of today and you should NOT DRIVE or use heavy machinery until tomorrow (because of the sedation medicines used during the test).    FOLLOW UP: Our staff will call the number listed on your records the next business day following your procedure.  We will call around 7:15- 8:00 am to check on you and address any questions or concerns that you may have regarding the information given to you following your procedure. If we do not reach you, we will leave a message.     If any biopsies were taken you will be contacted by phone or by letter within the next 1-3 weeks.  Please call us at [phone] if you have not heard about the biopsies in 3 weeks.    SIGNATURES/CONFIDENTIALITY: You and/or your care partner have signed paperwork which will be entered into your electronic medical record.  These signatures attest to the fact that that the information above on your After Visit Summary has been reviewed and is understood.  Full responsibility of the confidentiality of this discharge information lies with you and/or your care-partner.

## 2022-06-20 ENCOUNTER — Telehealth: Payer: Self-pay | Admitting: *Deleted

## 2022-06-20 NOTE — Telephone Encounter (Signed)
  Follow up Call-     06/19/2022    2:09 PM  Call back number  Post procedure Call Back phone  # [phone]  Permission to leave phone message Yes     Patient questions:  Do you have a fever, pain , or abdominal swelling? No. Pain Score  0 *  Have you tolerated food without any problems? Yes.    Have you been able to return to your normal activities? Yes.    Do you have any questions about your discharge instructions: Diet   No. Medications  No. Follow up visit  No.  Do you have questions or concerns about your Care? No.  Actions: * If pain score is 4 or above: No action needed, pain <4.

## 2022-06-29 ENCOUNTER — Encounter: Payer: Self-pay | Admitting: Gastroenterology

## 2022-07-08 ENCOUNTER — Telehealth: Payer: Self-pay | Admitting: Family Medicine

## 2022-07-08 NOTE — Telephone Encounter (Signed)
Called patient to schedule Medicare Annual Wellness Visit (AWV). Left message for patient to call back and schedule Medicare Annual Wellness Visit (AWV).  Last date of AWV: awvi 02/15/11 per [facility]   Please schedule an appointment at any time with [facility].  If any questions, please contact me at [phone]  Thank you ,  Rudell Cobb AWV direct phone # [phone]

## 2022-07-24 ENCOUNTER — Ambulatory Visit (INDEPENDENT_AMBULATORY_CARE_PROVIDER_SITE_OTHER): Payer: Medicare (Managed Care) | Admitting: Psychiatry

## 2022-07-24 DIAGNOSIS — F250 Schizoaffective disorder, bipolar type: Secondary | ICD-10-CM | POA: Diagnosis not present

## 2022-07-24 NOTE — Progress Notes (Signed)
[facility] Counsel[facility] Initial Adult Exam  Name: [name] Date: 07/24/2022 MRN: [id number] DOB: 1965-07-19 PCP: Garnette Gunner, MD  Time spent: 60 minutes  Guardian/Payee:  patient    Paperwork requested:  No   Reason for Visit /Presenting Problem:  anxiety, depression (son's suicide within past month)  Mental Status Exam:    Appearance:   Casual     Behavior:  Appropriate, Sharing, and Motivated  Motor:  Normal  Speech/Language:   Clear and Coherent  Affect:  Depressed and anxious  Mood:  anxious and depressed  Thought process:  goal directed  Thought content:    Rumination  Sensory/Perceptual disturbances:    WNL  Orientation:  oriented to person, place, time/date, situation, day of week, month of year, year, and stated date of Jul 24, 2022  Attention:  Fair  Concentration:  Fair  Memory:  WNL  Fund of knowledge:   Good  Insight:    Good  Judgment:   Good  Impulse Control:  Good   Reported Symptoms:  see symptoms above  Risk Assessment: Danger to Self:  No Self-injurious Behavior: No Danger to Others: No Duty to Warn:no Physical Aggression / Violence:No  Access to Firearms a concern: No  Gang Involvement:No  Patient / guardian was educated about steps to take if suicide or homicide risk level increases between visits: Denies any SI or HI. While future psychiatric events cannot be accurately predicted, the patient does not currently require acute inpatient psychiatric care and does not currently meet [facility] involuntary commitment criteria.  Substance Abuse History: Current substance abuse: No     Past Psychiatric History:   Previous psychological history is significant for anxiety, depression, and Schizoaffective Disorder Outpatient Providers:currently and previously at our current location [facility] and when she was living in [facility] saw a psychiatrist and therapist History of Psych Hospitalization:  Yes has been hospitalized 9 times since  the year 2000 in [place], [place], and locally at [facility]. Psychological Testing:  n/a    Abuse History: Victim of No.,  n/a    Report needed: No. Victim of Neglect:No. Perpetrator of  n/a   Witness / Exposure to Domestic Violence: No   Protective Services Involvement: No  Witness to [employer] Violence:  No   Family History: Reviewed and patient confirms info below. Family History  Problem Relation Age of Onset   Colon cancer Father 50   Breast cancer Father    Breast cancer Mother    Uterine cancer Mother    Stomach cancer Neg Hx    Rectal cancer Neg Hx    Esophageal cancer Neg Hx     Living situation: the patient lives with husband of 34 years. Has six adult kids: ages  (females 62, 71, 59, 60) and (males 28 86). All adult kids are supportive and all live within [facility] and [place]. My 73 yr old son went to [place] and killed himself, after breakup  with GF. Body is still in [place] and they are expecting it back in the [place] "maybe around May 16." Has 2-3 supportive friends and husband is supportive. She and husband like the apt they live in. Not involved in a church. Not employed and on Disability. Husband works at [employer] as a Engineer, materials.  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  Financial planner: No   Educational History: Education: some college  Oncologist:    none  Any cultural differences that may affect / interfere with treatment:  not applicable   Recreation/Hobbies: watching reality TV  Stressors:Health problems   Loss of 38 yr old son committed suicide within past month   Traumatic event    Strengths:  Supportive Relationships, Family,  Friends, Hopefulness, Journalist, newspaper, and Able to Communicate Effectively  Barriers:  "cannot think of anything that would stop me from getting better."   Legal History: Pending legal issue / charges: The patient has no significant history of legal issues. History of legal issue / charges:  n/a  Medical History/Surgical History:reviewed and patient confirms info below. 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  Father has colon cancer   ESOPHAGOGASTRODUODENOSCOPY (EGD) WITH PROPOFOL N/A 05/17/2019   Procedure: ESOPHAGOGASTRODUODENOSCOPY (EGD) WITH PROPOFOL;  Surgeon: Meryl Dare, MD;  Location: [facility];  Service: Endoscopy;  Laterality: N/A;   FOOT SURGERY     right x 2   NASAL SEPTOPLASTY W/ TURBINOPLASTY Bilateral 12/04/2014   Procedure: NASAL SEPTOPLASTY WITH BILATERAL TURBINATE REDUCTION;  Surgeon: Serena Colonel, MD;  Location: [facility];  Service: ENT;  Laterality: Bilateral;   TOTAL  ABDOMINAL HYSTERECTOMY W/ BILATERAL SALPINGOOPHORECTOMY     VAGINAL HYSTERECTOMY      Medications: Current Outpatient Medications  Medication Sig Dispense Refill   cyclobenzaprine (FLEXERIL) 10 MG tablet Take 10 mg by mouth at bedtime as needed for muscle spasms.      lithium carbonate 300 MG capsule Take 2 capsules (600 mg total) by mouth at bedtime. 180 capsule 1   pregabalin (LYRICA) 100 MG capsule Take 100 mg by mouth 2 (two) times daily.     QUEtiapine (SEROQUEL) 400 MG tablet Take 2 tablets (800 mg total) by mouth at bedtime. 180 tablet 1   Semaglutide, 2 MG/DOSE, (OZEMPIC, 2 MG/DOSE,) 8 MG/3ML SOPN Inject 2 mg into the skin once a week.     traMADol (ULTRAM) 50 MG tablet Take 100 mg by mouth every 8 (eight) hours as needed.  traZODone (DESYREL) 100 MG tablet TAKE ONE TO TWO TABLETS BY MOUTH AT BEDTIME AS NEEDED FOR SLEEP 180 tablet 1   zolpidem (AMBIEN) 10 MG tablet TAKE ONE TABLET BY MOUTH AT BEDTIME AS NEEDED FOR SLEEP 30 tablet 5   No current facility-administered medications for this visit.    No Known Allergies  Diagnoses:    ICD-10-CM   1. Schizoaffective disorder, bipolar type (HCC)  F25.0      Treatment goal plan of care:  Patient not signing treatment plan on computer screen due to continued concerns of COVID and other issues.  Review of treatment goals with patient and goals will remain on treatment plan as patient works with his goals both in and outside of therapy office.  Progress is assessed each session and noted in the "plan" and/or progress sections of treatment note. Appropriately grieve the loss in order to normalize mood and to return to previous adaptive level of functioning. 2.  Discuss the nature of relationship with the deceased and process her thoughts about "all that has happened" past month. 3.  Encourage sharing feelings of grief and depression, and anger in order to gain insight and move forward.   Plan of Care:  Today is first session for  patient with this therapist.  Elliyana Capoccia is a 57 year old married female and has been married for 34 years.  Does have significant prior history of behavioral health treatment and intervention as noted earlier in this initial evaluation and at her medical chart.  She reports that her husband is supportive of her and her behavioral health challenges.  Patient arrives today and reports anxiety, and depression "as a result of my son suicide within the past month".  Shared that 32 year old son travel to [place] and intentionally took overdose of medications which ended his life.  Patient adds that his body is still improved and they are working on getting it back to the states which she expects to happen within this month.  Because of this delay, they have not been able to have his funeral yet which she admits affects them being able to have some closure.  Even though this is her initial evaluation, it was obvious patient needed some of the time today to focus specifically on the death of her son.  She has 5 other adult children, ages 33/28/32/24/25, 53 females and 1 female and all are supportive of patient and her husband.  All of the adult children live in [place] and [place].  Patient reports having 2-3 supportive friends and her husband is supportive.  States that they are currently living in an apartment and like where they live.  Not involved in a church community.  Not employed and patient is on disability.  Husband works at [employer] as a Engineer, materials.  She reports that all of her children and her spouse as well as a couple of friends are supportive of her.  Patient over time has had a number of health problems "but nothing that keeps me from getting around".  Patient reports that she "cannot think of anything that would be a barrier to her getting better".  She reports her strengths as being a couple of supportive relationships, family, hopefulness, self advocate, and her ability to communicate  effectively.  Has had a variety of counseling experiences with different people over the years but has not been in treatment "most recently".  Most important issue for her right now and treatment is "being able to work through  and better deal with the loss of son".  Discussed initial treatment goal plans with patient and they are noted in this initial evaluation.  Patient denies any SI.  Patient is motivated and I plan to see her again within 1 to 2 weeks.   Review of initial treatment goal plan and patient is in agreement.  Next appointment within 2 weeks.   Mathis Fare, LCSW

## 2022-08-04 ENCOUNTER — Ambulatory Visit (INDEPENDENT_AMBULATORY_CARE_PROVIDER_SITE_OTHER): Payer: Medicare (Managed Care) | Admitting: Psychiatry

## 2022-08-04 DIAGNOSIS — F250 Schizoaffective disorder, bipolar type: Secondary | ICD-10-CM

## 2022-08-04 NOTE — Progress Notes (Signed)
[facility] Counselor/Therapist Progress Note  Patient ID: [name], MRN: [id number],    Date: 08/04/2022  Time Spent: 50 minutes   Treatment Type: Individual Therapy  Reported Symptoms: anxiety, depression (decreasing), some difficulty concentrating  Mental Status Exam:  Appearance:   Casual     Behavior:  Appropriate, Sharing, and Motivated  Motor:  Normal  Speech/Language:   Clear and Coherent  Affect:  Anxiety, some depression  Mood:  anxious  Thought process:  goal directed  Thought content:    WNL  Sensory/Perceptual disturbances:    WNL  Orientation:  oriented to person, place, time/date, situation, day of week, month of year, year, and stated date of Aug 03, 2021  Attention:  Good here but in some situations and relationships she reports it not being as good with husband  Concentration:  Good  Memory:  WNL  Fund of knowledge:   Good  Insight:    Good  Judgment:   Good  Impulse Control:  Good   Risk Assessment: Danger to Self:  No Self-injurious Behavior: No Danger to Others: No Duty to Warn:no Physical Aggression / Violence:No  Access to Firearms a concern: No  Gang Involvement:No   Subjective: Patient in today and worked further on her unresolved grief regarding the suicide of one of her sons, 56 years old, who had travel to [place] and took his own life.  She, herself, denies any thoughts to harm herself.  Patient reports the continued support of 2-3 friends, her husband, and her 5 other adult kids.  She is not involved in any outside activities that would offer support but does feel very supported by her adult children and husband.  She is not employed and remains on disability.  As noted in first visit, patient has had a number of behavioral health challenges over the years, making it even more difficult to accept and work through the recent suicide of her son. Still do not have his body back in the [place] yet but "authorities are saying it may be end of the  month." Patient keeping picture of son that was given to them by embassy in [place] showing their deceased son in floor. States eventually she'll let go of picture as "it doesn't help". States she "is currently working on the finality of son's death and "I don't like this and I wonder why he would kill himself." Processed a lot of history about her son and this seemed helpful to patient. Feels son's strengths were that he was very talented in music and playing instruments, and being creative, and patient finds comfort and holding onto these memories.  Encouraged to do some writing between sessions and bring in with her next appointment.  She continues to see other strengths as being a good self advocate, her family, and a sense of hopefulness that she can get better which we talked about further today.  Interventions: Cognitive Behavioral Therapy and Ego-Supportive  Treatment goal plan of care:  Patient not signing treatment plan on computer screen due to continued concerns of COVID and other issues.  Review of treatment goals with patient and goals will remain on treatment plan as patient works with his goals both in and outside of therapy office.  Progress is assessed each session and noted in the "plan" and/or progress sections of treatment note. Appropriately grieve the loss in order to normalize mood and to return to previous adaptive level of functioning. 2.  Discuss the nature of relationship with  the deceased and process her thoughts about "all that has happened" past month. 3.  Encourage sharing feelings of grief and depression, and anger in order to gain insight and move forward.   Diagnosis:   ICD-10-CM   1. Schizoaffective disorder, bipolar type (HCC)  F25.0      Plan:   Patient participating actively in session today as we continued from her initial evaluation recently, allowing patient to share in more depth today information shared at initial session as the past few weeks have been very  difficult for her due to the unexpected suicide of an adult son.  Son had traveled to [place] and ended his life there.  The body had been delayed in getting back to the [place] still has not arrived but is expected to by the end of this month per authorities improved.  Patient showing some progress and needs to continue working with goal-directed behaviors in order to move in a forward direction.  Goal review and progress/challenges noted with patient.  Next appointment within 2 weeks.   Mathis Fare, LCSW

## 2022-08-19 ENCOUNTER — Ambulatory Visit (INDEPENDENT_AMBULATORY_CARE_PROVIDER_SITE_OTHER): Payer: Medicare (Managed Care) | Admitting: Psychiatry

## 2022-08-19 DIAGNOSIS — F250 Schizoaffective disorder, bipolar type: Secondary | ICD-10-CM | POA: Diagnosis not present

## 2022-08-19 NOTE — Progress Notes (Signed)
[facility] Counselor/Therapist Progress Note  Patient ID: [name], MRN: [id number],    Date: 08/19/2022  Time Spent: 45 minutes   Treatment Type: Individual Therapy  Virtual Visit via Telehealth Note: MyChartVideo Connected with patient by a telemedicine/telehealth application, with their informed consent, and verified patient privacy and that I am speaking with the correct person using two identifiers. I discussed the limitations, risks, security and privacy concerns of performing psychotherapy and the availability of in person appointments. I also discussed with the patient that there may be a patient responsible charge related to this service. The patient expressed understanding and agreed to proceed. I discussed the treatment planning with the patient. The patient was provided an opportunity to ask questions and all were answered. The patient agreed with the plan and demonstrated an understanding of the instructions. The patient was advised to call  our office if  symptoms worsen or feel they are in a crisis state and need immediate contact.   Therapist Location: office Patient Location: home   Reported Symptoms: Anxiety decreased, depression decreased  Mental Status Exam:  Appearance:   Casual     Behavior:  Appropriate, Sharing, and Motivated  Motor:  Normal  Speech/Language:   Clear and Coherent  Affect:  Some anxiety  Mood:  anxious  Thought process:  goal directed  Thought content:    WNL  Sensory/Perceptual disturbances:    WNL  Orientation:  oriented to person, place, time/date, situation, day of week, month of year, year, and stated date of August 19, 2022  Attention:  Fair  Concentration:  Fair  Memory:  WNL  Fund of knowledge:   Good  Insight:    Good  Judgment:   Good  Impulse Control:  Good   Risk Assessment: Danger to Self:  No Self-injurious Behavior: No Danger to Others: No Duty to Warn:no Physical Aggression / Violence:No  Access to Firearms a  concern: No  Gang Involvement:No   Subjective: Patient today in telehealth session working further on unresolved grief re: suicide of 56 yr old son who traveled to [place] and took his own life. Patient continued to deny any SI herself and seems sincere in this.  Continues to receive support from a friend, her husband, and their 5 other adult kids. States she sees her deceased son as a child, about 56 yrs old "as I feel like I didn't know him as adult person who overdosed and took his own life. " Does feel she is making progress and her 5 other adult children are supportive. Also working on some issues with her daughter and feels things are improving. States she feels she is more stable now. Also states "my prescribed drugs are very important to me, #1 in my life and my whole life revolves around them." Does want to schedule another appt and she states her other adult children will be visiting her and husband, and that she will call our office and schedule the appt at a time when her adult children will not be visiting.   Interventions: Cognitive Behavioral Therapy, Ego-Supportive, and Grief Therapy  Treatment goal plan of care:  Patient not signing treatment plan on computer screen due to continued concerns of COVID and other issues.  Review of treatment goals with patient and goals will remain on treatment plan as patient works with his goals both in and outside of therapy office.  Progress is assessed each session and noted in the "plan" and/or progress sections of treatment  note. Appropriately grieve the loss in order to normalize mood and to return to previous adaptive level of functioning. 2.  Discuss the nature of relationship with the deceased and process her thoughts about "all that has happened" past month. 3.  Encourage sharing feelings of grief and depression, and anger in order to gain insight and move forward.    Diagnosis:   ICD-10-CM   1. Schizoaffective disorder, bipolar type (HCC)  F25.0       Plan: Patient participating in session today stating that she was continuing to work on her grief regarding her son but felt she was also much better.  Has worked some with goal-directed behaviors and needs to continue working with them and remain on her medications in order to move forward.  She is very clear that her medications are very important to her and that her life "revolves around them".  States that her other adult children are going to be visiting her soon but will call our office for a return appointment for therapy.  Still keeps her distance from other people outside the family and states "that is just the way we are" and adds "if your last name is not Treglia we do not really want to be around you".  Did state that she was not relating that to me or therapy, but rather she just does not particularly enjoy being around other people, not just in stressful times but stated that this is the way she has been most of her life.  Encouraged patient in practicing more positive and self affirming behaviors as noted in sessions including: Staying in the present and focusing on what she can control or change, consider letting other people be of support to her, remain on prescribed medication, healthy nutrition and exercise, refrain from assuming worst-case scenarios, positive self talk and self-care, and recognize the strength she shows working with goal-directed behaviors to move in a direction that supports increased confidence, improved emotional health, and overall wellbeing.  Goal review and progress/challenges noted with patient.  Next appointment within 2 weeks.   Mathis Fare, LCSW

## 2022-08-21 ENCOUNTER — Ambulatory Visit: Payer: Medicare (Managed Care) | Admitting: Gastroenterology

## 2022-11-04 ENCOUNTER — Ambulatory Visit (INDEPENDENT_AMBULATORY_CARE_PROVIDER_SITE_OTHER): Payer: Medicare (Managed Care) | Admitting: Physician Assistant

## 2022-11-04 ENCOUNTER — Encounter: Payer: Self-pay | Admitting: Physician Assistant

## 2022-11-04 DIAGNOSIS — F5105 Insomnia due to other mental disorder: Secondary | ICD-10-CM

## 2022-11-04 DIAGNOSIS — F99 Mental disorder, not otherwise specified: Secondary | ICD-10-CM

## 2022-11-04 DIAGNOSIS — Z79899 Other long term (current) drug therapy: Secondary | ICD-10-CM

## 2022-11-04 DIAGNOSIS — F411 Generalized anxiety disorder: Secondary | ICD-10-CM

## 2022-11-04 DIAGNOSIS — Z634 Disappearance and death of family member: Secondary | ICD-10-CM

## 2022-11-04 DIAGNOSIS — F250 Schizoaffective disorder, bipolar type: Secondary | ICD-10-CM

## 2022-11-04 DIAGNOSIS — F4321 Adjustment disorder with depressed mood: Secondary | ICD-10-CM

## 2022-11-04 MED ORDER — QUETIAPINE FUMARATE 400 MG PO TABS: 800.0000 mg | ORAL_TABLET | Freq: Every day | ORAL | 1 refills | Status: DC

## 2022-11-04 MED ORDER — LITHIUM CARBONATE 300 MG PO CAPS: 600.0000 mg | ORAL_CAPSULE | Freq: Every day | ORAL | 1 refills | Status: DC

## 2022-11-04 MED ORDER — ZOLPIDEM TARTRATE 10 MG PO TABS: ORAL_TABLET | ORAL | 5 refills | Status: DC

## 2022-11-04 NOTE — Progress Notes (Signed)
[facility]  Patient ID: [name],  MRN: [id number]  PCP: Garnette Gunner, MD  Date of Evaluation: 11/04/2022 time spent: 33 minutes  Chief Complaint:  Chief Complaint   Follow-up    HISTORY/CURRENT STATUS: HPI For routine med check.  One of her son's committed suicide in [place] 4 months ago.  They did not even know he was in [place].  There is some sort of "tourism" where people who want to commit suicide have a guide, are taken there and introduced to someone who can get them a horse tranquilizer, or they get it for them, administer it and that causes death.  At least they think that is what has happened.  Another American died the same way approximately 2 days later so the [employer] is aware and are investigating.  She is dealing with it fine.  He had a history of depression.  She and her other son are the only ones in her family that seemed to be grieving though.  Her 4 daughters and husband do not act like it is bothering them in the least.  She cries sometimes, has seen a counselor but is not at the present time, does not feel like she needs to.  She is not really enjoying much of anything but does not feel like it is related to the death of her son.  She had prophylactic bilateral mastectomy 2 months ago so is still healing from that.  Her energy and motivation are coming back.  She does not work outside the home.  She is sleeping okay.  Takes the trazodone and Ambien and they are beneficial.  ADLs and personal hygiene are normal.  Appetite is normal and weight is stable.  No change in memory, focus, or attention.  Denies suicidal or homicidal thoughts.  Patient denies increased energy with decreased need for sleep, increased talkativeness, racing thoughts, impulsivity or risky behaviors, increased spending, increased libido, grandiosity, increased irritability or anger, paranoia, or hallucinations.  Review of Systems  Constitutional: Negative.   HENT: Negative.     Eyes: Negative.   Respiratory: Negative.    Cardiovascular: Negative.   Gastrointestinal: Negative.   Genitourinary: Negative.   Musculoskeletal: Negative.   Skin: Negative.   Endo/Heme/Allergies:        She sees endocrinology for thyroid issues, she reports not needing Synthroid now.    Individual Medical History/ Review of Systems: Changes? :Yes     she had double mastectomy since her last visit.  Will have reconstructive surgery in October.  Past medications for mental health diagnoses include: Depakote, Tegretol, BuSpar, lithium, Seroquel, trazodone, Ambien  Allergies: Patient has no known allergies.  Current Medications:  Current Outpatient Medications:    cyclobenzaprine (FLEXERIL) 10 MG tablet, Take 10 mg by mouth at bedtime as needed for muscle spasms. , Disp: , Rfl:    pregabalin (LYRICA) 100 MG capsule, Take 100 mg by mouth 2 (two) times daily., Disp: , Rfl:    traMADol (ULTRAM) 50 MG tablet, Take 100 mg by mouth every 8 (eight) hours as needed., Disp: , Rfl:    traZODone (DESYREL) 100 MG tablet, TAKE ONE TO TWO TABLETS BY MOUTH AT BEDTIME AS NEEDED FOR SLEEP, Disp: 180 tablet, Rfl: 1   lithium carbonate 300 MG capsule, Take 2 capsules (600 mg total) by mouth at bedtime., Disp: 180 capsule, Rfl: 1   QUEtiapine (SEROQUEL) 400 MG tablet, Take 2 tablets (800 mg total) by mouth at bedtime., Disp: 180 tablet, Rfl: 1  Semaglutide, 2 MG/DOSE, (OZEMPIC, 2 MG/DOSE,) 8 MG/3ML SOPN, Inject 2 mg into the skin once a week. (Patient not taking: Reported on 11/04/2022), Disp: , Rfl:    zolpidem (AMBIEN) 10 MG tablet, TAKE ONE TABLET BY MOUTH AT BEDTIME AS NEEDED FOR SLEEP, Disp: 30 tablet, Rfl: 5 Medication Side Effects: none  Family Medical/ Social History: Changes? No  MENTAL HEALTH EXAM:  There were no vitals taken for this visit.There is no height or weight on file to calculate BMI.  General Appearance: Casual, Neat, Well Groomed and Obese  Eye Contact:  Good  Speech:  Clear  and Coherent and Normal Rate  Volume:  Normal  Mood:  Euthymic  Affect:  Flat  Thought Process:  Goal Directed and Descriptions of Associations: Intact  Orientation:  Full (Time, Place, and Person)  Thought Content: Logical   Suicidal Thoughts:  No  Homicidal Thoughts:  No  Memory:  WNL  Judgement:  Good  Insight:  Good  Psychomotor Activity:  Normal  Concentration:  Concentration: Good and Attention Span: Good  Recall:  Good  Fund of Knowledge: Good  Language: Good  Assets:  Desire for Improvement Financial Resources/Insurance Housing Transportation  ADL's:  Intact  Cognition: WNL  Prognosis:  Good   Labs followed by PCP and endocrinology  DIAGNOSES:    ICD-10-CM   1. Schizoaffective disorder, bipolar type (HCC)  F25.0 Lithium level    2. Generalized anxiety disorder  F41.1     3. Insomnia due to other mental disorder  F51.05    F99     4. Encounter for long-term (current) use of medications  Z79.899 Lithium level    5. Grief at loss of child  F43.21    Z63.4      Receiving Psychotherapy: No    RECOMMENDATIONS:  PDMP was reviewed.  Last Ambien filled 10/07/2022.  Oxycodone, tramadol, and Lyrica filled in the past 6 months as well. I provided 33 minutes of face to face time during this encounter, including time spent before and after the visit in records review, medical decision making, counseling pertinent to today's visit, and charting.   My condolences in the loss of her son.  Recommend she resume counseling.  Sleep hygiene discussed.  Continue lithium 300 mg, 2 p.o. nightly. Continue Seroquel 400 mg, 2 p.o. nightly. Continue trazodone 100 mg, 1-2 p.o. nightly as needed. Continue Ambien 10 mg 1 nightly as needed sleep. Lab ordered as above to be drawn in the next couple of months.  Importance stressed.   Return in 6 months.  Melony Overly, PA-C

## 2022-11-05 ENCOUNTER — Telehealth: Payer: Self-pay | Admitting: Physician Assistant

## 2022-11-05 MED ORDER — TRAZODONE HCL 100 MG PO TABS: ORAL_TABLET | ORAL | 1 refills | Status: DC

## 2022-11-05 NOTE — Telephone Encounter (Signed)
Sent!

## 2022-11-05 NOTE — Telephone Encounter (Signed)
Pt was seen yesterday . Her trazodone 100 mg was not sent in yesterday. She will be out in a couple of days.

## 2022-11-13 ENCOUNTER — Ambulatory Visit: Payer: Medicare (Managed Care) | Admitting: Family Medicine

## 2022-11-13 ENCOUNTER — Encounter: Payer: Self-pay | Admitting: Family Medicine

## 2022-11-13 ENCOUNTER — Ambulatory Visit (INDEPENDENT_AMBULATORY_CARE_PROVIDER_SITE_OTHER): Payer: Medicare (Managed Care) | Admitting: Family Medicine

## 2022-11-13 VITALS — BP 130/80 | HR 85 | Temp 98.0°F | Resp 16 | Ht 66.0 in | Wt 212.0 lb

## 2022-11-13 DIAGNOSIS — R21 Rash and other nonspecific skin eruption: Secondary | ICD-10-CM

## 2022-11-13 MED ORDER — CEPHALEXIN 500 MG PO CAPS
500.0000 mg | ORAL_CAPSULE | Freq: Two times a day (BID) | ORAL | 0 refills | Status: AC
Start: 2022-11-13 — End: 2022-11-20

## 2022-11-13 NOTE — Progress Notes (Signed)
Assessment/Plan:   Problem List Items Addressed This Visit       Musculoskeletal and Integument   Rash and nonspecific skin eruption - Primary    The patient presents with a worsening rash on the bilateral breasts extending to arms and back post bilateral mastectomy.  Differential diagnosis: Allergic reaction or irritation to breast expanders Skin infection such as cellulitis Fungal infection like Candida  Plan: Treat with Cephalexin for potential skin infection: Rx Cephalexin 500 mg, take one capsule four times a day for 7 days. Recommend follow-up with breast surgeon, appointment already scheduled for upcoming Tuesday. Advise the patient to take gentle care of skin - use gentle soaps, avoid hydrocortisone cream, moisturizing with non-irritating agents like Eucerin. Discuss potential symptoms of severe infection that would necessitate an emergency visit. Instruct the patient to monitor for worsening drainage, fever with chills, or increased pain.      Relevant Medications   cephALEXin (KEFLEX) 500 MG capsule    There are no discontinued medications.  No follow-ups on file.    Subjective:   Encounter date: 11/13/2022  [name] is a 57 y.o. female who has Schizoaffective disorder, bipolar type (HCC); Metatarsalgia, right foot; External hemorrhoids; Rash and nonspecific skin eruption; Nausea with vomiting; Thrombocytopenia, unspecified (HCC); De Quervain's tenosynovitis, left; Bipolar affective disorder, depressed, severe (HCC); Opiate dependence (HCC); Intentional opiate overdose (HCC); Bereavement; Chronic pain syndrome; Gastroesophageal reflux disease without esophagitis; Deviated septum; Mass of arm; Situational anxiety; OAB (overactive bladder); Urgency incontinence; Achilles tendon contracture, right; Aspiration pneumonia (HCC); PNA (pneumonia); Community acquired pneumonia of left lower lobe of lung; Esophageal thickening; Abnormal CT scan, esophagus; Gastritis;  Esophageal stenosis; Lumbar degenerative disc disease; Morbid obesity (HCC); 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   She  has a past medical history of Anxiety, Bipolar affective disorder, mixed, in partial or remission, Depression, Dysrhythmia, Headache(784.0), History of gallstones, Kidney stones, Mental disorder, Migraine, Pneumonia, PONV (postoperative nausea and vomiting), Renal disorder, Sleep apnea, and Substance abuse (HCC)..   Chief Complaint: Patient presents with a rash on both breasts post bilateral mastectomy.  History of Present Illness:  Rash. Patient reports the onset of a rash on both breasts for the past 2 weeks. The rash sometimes gets better and sometimes worse and has started extending to the arms and back. The patient has a history of a double mastectomy performed in July, with the latest follow-up being about 2 weeks ago. The rash was not present during the last examination. The patient notes the rash is itchy and painful, sometimes exacerbated by the use of heating pads. No associated joint pain, fever, or drainage from the wound. The patient has tried Benadryl and hydrocortisone with no relief. The patient reports extensive dryness and scaling of the skin, a family history of dry skin, and no new exposures to soaps, detergents, or perfumes.   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   Father has colon cancer   ESOPHAGOGASTRODUODENOSCOPY (EGD) WITH PROPOFOL N/A 05/17/2019   Procedure: ESOPHAGOGASTRODUODENOSCOPY (EGD) WITH PROPOFOL;  Surgeon: Meryl Dare, MD;  Location: [facility];  Service: Endoscopy;  Laterality: N/A;   FOOT SURGERY     right x 2   NASAL SEPTOPLASTY W/ TURBINOPLASTY Bilateral 12/04/2014   Procedure: NASAL SEPTOPLASTY WITH BILATERAL TURBINATE REDUCTION;  Surgeon: Serena Colonel, MD;  Location: [facility];  Service: ENT;  Laterality: Bilateral;   TOTAL ABDOMINAL HYSTERECTOMY W/ BILATERAL SALPINGOOPHORECTOMY     VAGINAL HYSTERECTOMY      Outpatient Medications Prior to Visit  Medication Sig Dispense Refill   cyclobenzaprine (FLEXERIL) 10 MG tablet Take 10 mg by mouth at bedtime as needed for muscle spasms.      lithium carbonate 300 MG capsule Take 2 capsules (600 mg total) by mouth at bedtime. 180 capsule 1   pregabalin (LYRICA) 100 MG capsule Take 100 mg by mouth 2 (two) times daily.     QUEtiapine (SEROQUEL) 400 MG tablet Take 2 tablets (800 mg total) by mouth at bedtime. 180 tablet 1   Semaglutide, 2 MG/DOSE, (OZEMPIC, 2 MG/DOSE,) 8 MG/3ML SOPN Inject 2 mg into the skin once a week.     traMADol (ULTRAM) 50 MG tablet Take 100 mg by mouth every 8 (eight) hours as needed.     traZODone (DESYREL) 100 MG tablet TAKE ONE TO TWO TABLETS BY MOUTH AT BEDTIME AS NEEDED FOR SLEEP 180 tablet 1   zolpidem (AMBIEN) 10 MG tablet TAKE ONE TABLET BY MOUTH AT BEDTIME AS NEEDED FOR SLEEP 30 tablet 5   No facility-administered medications prior to visit.    Family History  Problem Relation Age of Onset   Colon cancer Father 58   Breast cancer Father    Breast cancer Mother    Uterine cancer Mother    Stomach cancer Neg Hx    Rectal cancer Neg Hx    Esophageal cancer Neg Hx     Social History   Socioeconomic History   Marital status: Married    Spouse name: Not on file   Number of children: 6   Years of education: Not on file   Highest  education level: Not on file  Occupational History   Occupation: nursing student  Tobacco Use   Smoking status: Never    Passive exposure: Never   Smokeless tobacco: Never  Vaping Use   Vaping status: Never Used  Substance and Sexual Activity   Alcohol use: No    Alcohol/week: 0.0 standard drinks of alcohol   Drug use: No    Types: Other-see comments, Fentanyl, Hydrocodone   Sexual activity: Not Currently    Birth control/protection: Surgical  Other Topics Concern   Not on file  Social History Narrative   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, adult education)   Grew up in [place]   parents retired in [place], [place]   Married 22 years     2 sons ( 17, 74 )   4 daughters ( 27, 69,  71, 11)Smoking Status:  never   Does Patient Exercise:  no   Caffeine use/day:  4-5 beverages daily   Social Determinants of Corporate investment banker Strain: Not on file  Food Insecurity: Not on file  Transportation Needs: Not on file  Physical Activity: Not on file  Stress: Not on file  Social Connections: Not on file  Intimate Partner Violence: Not on file  Objective:  Physical Exam: BP 130/80 (BP Location: Right Arm, Patient Position: Sitting, Cuff Size: Large)   Pulse 85   Temp 98 F (36.7 C)   Resp 16   Ht 5\' 6"  (1.676 m)   Wt 212 lb (96.2 kg)   SpO2 94%   BMI 34.22 kg/m     Physical Exam Exam conducted with a chaperone present Hulen Luster).  Skin:    Comments: Rash on bilateral breasts, extending to arms and back, worse on left side. Scaling, dry skin reported especially on the back and lower body. No oozing or active infection noted.     No results found.  No results found for this or any previous visit (from the past 2160 hour(s)).      Garner Nash, MD, MS

## 2022-11-13 NOTE — Assessment & Plan Note (Signed)
The patient presents with a worsening rash on the bilateral breasts extending to arms and back post bilateral mastectomy.  Differential diagnosis: Allergic reaction or irritation to breast expanders Skin infection such as cellulitis Fungal infection like Candida  Plan: Treat with Cephalexin for potential skin infection: Rx Cephalexin 500 mg, take one capsule four times a day for 7 days. Recommend follow-up with breast surgeon, appointment already scheduled for upcoming Tuesday. Advise the patient to take gentle care of skin - use gentle soaps, avoid hydrocortisone cream, moisturizing with non-irritating agents like Eucerin. Discuss potential symptoms of severe infection that would necessitate an emergency visit. Instruct the patient to monitor for worsening drainage, fever with chills, or increased pain.

## 2022-12-09 ENCOUNTER — Ambulatory Visit: Payer: Medicare (Managed Care) | Admitting: Internal Medicine

## 2022-12-09 NOTE — Progress Notes (Deleted)
Name: [name]  MRN/ DOB: [id number], Dec 19, 1965    Age/ Sex: 57 y.o., female    PCP: Garnette Gunner, MD   Reason for Endocrinology Evaluation: Hypothyroidism     Date of Initial Endocrinology Evaluation: 06/04/2022    HPI: [name] is a 57 y.o. female with a past medical history of thrombocytopenia, opiate dependence, bipolar affective disorder. The patient presented for initial endocrinology clinic visit on 06/04/2022 for consultative assistance with her Hypothyroidism.   Pt has been diagnosed with hypothyroidism since 2021.  Of note, the pt has been on lithium since 2005 and follows with Psychiatry   She was prescribed levothyroxine at some point but has not taken it in a while because she believes she is on enough medications and does not want to add more    NO XRT or anterior neck sx   No Fh of thyroid disease   She is on a compounded semaglutide that she gets online    On her initial visit to our clinic she had a TSH of 6.79 u IU/mL with normal low free T4 at 0.64 ng/dL, we opted to monitor  Thyroid ultrasound 02/2018 revealed a diffusely heterogeneous gland without discrete nodules  SUBJECTIVE:    Today (12/09/22):  [name] is here for follow-up on hypothyroidism.  She is accompanied by her spouse Renae Fickle   She continues to follow-up with behavioral health for bipolar/schizoaffective disorder  Stable local neck swelling  Denies palpitations  Denies tremors  Denies constipation or diarrhea   No Biotin   HISTORY:  Past Medical History:  Past Medical History:  Diagnosis Date   Anxiety    Bipolar affective disorder, mixed, in partial or remission    Depression    Dysrhythmia    Left sided heart diease   Headache(784.0)    History of gallstones    Kidney stones    Mental disorder    Migraine    Pneumonia    Hx: of as a child   PONV (postoperative nausea and vomiting)    Renal disorder    Sleep apnea    sleep test 2014, did not  have osa   Substance abuse (HCC)    Fentanyl, Percocet. 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  Father has colon cancer   ESOPHAGOGASTRODUODENOSCOPY (EGD) WITH PROPOFOL N/A 05/17/2019   Procedure: ESOPHAGOGASTRODUODENOSCOPY (EGD) WITH PROPOFOL;  Surgeon: Meryl Dare, MD;  Location: [facility];  Service: Endoscopy;  Laterality: N/A;   FOOT SURGERY     right x 2   NASAL SEPTOPLASTY W/ TURBINOPLASTY Bilateral 12/04/2014   Procedure: NASAL SEPTOPLASTY WITH BILATERAL TURBINATE REDUCTION;  Surgeon: Serena Colonel, MD;  Location: [facility];  Service: ENT;  Laterality: Bilateral;   TOTAL ABDOMINAL HYSTERECTOMY W/ BILATERAL SALPINGOOPHORECTOMY     VAGINAL HYSTERECTOMY      Social  History:  reports that she has never smoked. She has never been exposed to tobacco smoke. She has never used smokeless tobacco. She reports that she does not drink alcohol and does not use drugs. Family History: family history includes Breast cancer in her father and mother; Colon cancer (age of onset: 40) in her father; Uterine cancer in her mother.   HOME MEDICATIONS: Allergies as of 12/09/2022   No Known Allergies      Medication List        Accurate as of December 09, 2022  7:21 AM. If you have any questions, ask your nurse or doctor.          cyclobenzaprine 10 MG tablet Commonly known as: FLEXERIL Take 10 mg by mouth at bedtime as needed for  muscle spasms.   lithium carbonate 300 MG capsule Take 2 capsules (600 mg total) by mouth at bedtime.   Ozempic (2 MG/DOSE) 8 MG/3ML Sopn Generic drug: Semaglutide (2 MG/DOSE) Inject 2 mg into the skin once a week.   pregabalin 100 MG capsule Commonly known as: LYRICA Take 100 mg by mouth 2 (two) times daily.   QUEtiapine 400 MG tablet Commonly known as: SEROQUEL Take 2 tablets (800 mg total) by mouth at bedtime.   traMADol 50 MG tablet Commonly known as: ULTRAM Take 100 mg by mouth every 8 (eight) hours as needed.   traZODone 100 MG tablet Commonly known as: DESYREL TAKE ONE TO TWO TABLETS BY MOUTH AT BEDTIME AS NEEDED FOR SLEEP   zolpidem 10 MG tablet Commonly known as: AMBIEN TAKE ONE TABLET BY MOUTH AT BEDTIME AS NEEDED FOR SLEEP          REVIEW OF SYSTEMS: A comprehensive ROS was conducted with the patient and is negative except as per HPI    OBJECTIVE:  VS: There were no vitals taken for this visit.   Wt Readings from Last 3 Encounters:  11/13/22 212 lb (96.2 kg)  06/19/22 218 lb (98.9 kg)  06/04/22 211 lb (95.7 kg)     EXAM: General: Pt appears well and is in NAD  Neck: General: Supple without adenopathy. Thyroid: Thyroid size normal.  No goiter or nodules appreciated.   Lungs: Clear with good BS bilat with no rales, rhonchi, or wheezes  Heart: Auscultation: RRR.  Extremities:  BL LE: No pretibial edema normal ROM and strength.  Mental Status: Judgment, insight: Intact Orientation: Oriented to time, place, and person Mood and affect: No depression, anxiety, or agitation     DATA REVIEWED:  Latest Reference Range & Units 06/04/22 13:25  TSH 0.35 - 5.50 uIU/mL 6.79 (H)  T4,Free(Direct) 0.60 - 1.60 ng/dL 1.61  (H): Data is abnormally high    Thyroid ultrasound 03/11/2018 Diffusely enlarged, heterogeneous and lobular thyroid gland. No distinct thyroid nodules can be identified separate from the background thyroid parenchyma.    IMPRESSION: Diffusely heterogeneous, enlarged and lobular thyroid gland without discrete thyroid nodules.   ASSESSMENT/PLAN/RECOMMENDATIONS:   Hypothyroidism :  - This is most likely attributed to lithium intake  - Discussed pathophysiology of hypothyroidism, discussed effects of uncontrolled hypothyroidism such as weight gain, fatigue, constipation as well as myxedema in severe cases  -The patient is very skeptical about taking LT-for replacement, given that her TSH is trending down and her free T4 has normalized, we have opted to monitor at this time without starting any levothyroxine  F/U in 4 months  Signed electronically by: Lyndle Herrlich, MD  [facility]  [facility]  [address], [address] [place], [place] [address] Phone: [phone] FAX: [phone]   CC: Garnette Gunner, MD [address] [place] [address] Phone: [phone] Fax: [phone]   Return to Endocrinology clinic as below: Future Appointments  Date Time Provider Department Center  12/09/2022 11:10 AM Avalina Benko, Konrad Dolores, MD [facility] None  05/07/2023 11:00 AM Cherie Ouch, PA-C [facility] None

## 2023-04-20 ENCOUNTER — Telehealth: Payer: Medicare (Managed Care)

## 2023-04-20 NOTE — Progress Notes (Unsigned)
Patient cancelled appointment after chart was opened

## 2023-04-28 ENCOUNTER — Ambulatory Visit: Payer: Self-pay | Admitting: Family Medicine

## 2023-04-28 NOTE — Telephone Encounter (Signed)
Chief Complaint: SOB Symptoms: dry barking cough, SOB Frequency: COVID on 1/30 Pertinent Negatives: Patient denies fever, N/V/D, CP Disposition: [x] ED /[] Urgent Care (no appt availability in office) / [] Appointment(In office/virtual)/ []  [facility]/ [] Home Care/ [] Refused Recommended Disposition /[] [facility]/ []  Follow-up with PCP Additional Notes: Pt reports SOB and a dry barking cough. States she tested positive for COVID on 1/30 after returning from [place]. Pt denies fever, congestion, sore throat. States all of those symptoms have subsided. Pt still has dry barking cough and SOB. States she has intermittent wheezing. Endorses periods every 45 minutes in which her lungs "contract" and "I can't get a breath in." Hx of asthma. RN heard no wheezing on the phone and pt is conversing normally. Pt denies SOB unless she is having an episode of her muscles contracting, but then states she feels SOB at rest and states it is moderate. Been checking O2 with pulse ox at home, readings of 92-95%. No CP. Per protocol, RN advised pt to go to the ED and pt was agreeable. Husband can't bring her, he's at work. Pt states she will Uber. RN advised that that is safe at this time. RN advised pt she needs to call 911 if she develops chest pain or if the difficulty breathing worsens or becomes constant, pt verbalized understanidng.   Copied from CRM [id number]. Topic: Clinical - Red Word Triage >> Apr 28, 2023 12:27 PM Gurney Maxin H wrote: [facility] that prompted transfer to Nurse Triage: Coughing and every 45 minutes lungs contract and cant breathe in. Reason for Disposition  [1] MODERATE difficulty breathing (e.g., speaks in phrases, SOB even at rest, pulse 100-120) AND [2] NEW-onset or WORSE than normal  Answer Assessment - Initial Assessment Questions 1. RESPIRATORY STATUS: "Describe your breathing?" (e.g., wheezing, shortness of breath, unable to speak, severe coughing)      Wheezing  intermittently, "barking" cough, two breaths every 45 minutes "my lungs contract and I am wheezing." Describes lungs contracting as "hard to get a breath in", no CP with that symptom 2. ONSET: "When did this breathing problem begin?"      Thursday 1/30 - came home and tested positive for COVID after traveling to [place] 3. PATTERN "Does the difficult breathing come and go, or has it been constant since it started?"      Come and go 4. SEVERITY: "How bad is your breathing?" (e.g., mild, moderate, severe)    - MILD: No SOB at rest, mild SOB with walking, speaks normally in sentences, can lie down, no retractions, pulse < 100.    - MODERATE: SOB at rest, SOB with minimal exertion and prefers to sit, cannot lie down flat, speaks in phrases, mild retractions, audible wheezing, pulse 100-120.    - SEVERE: Very SOB at rest, speaks in single words, struggling to breathe, sitting hunched forward, retractions, pulse > 120      No SOB at rest unless she is having an episode of lungs contracting 5. RECURRENT SYMPTOM: "Have you had difficulty breathing before?" If Yes, ask: "When was the last time?" and "What happened that time?"      Hx of asthma 6. CARDIAC HISTORY: "Do you have any history of heart disease?" (e.g., heart attack, angina, bypass surgery, angioplasty)      No 7. LUNG HISTORY: "Do you have any history of lung disease?"  (e.g., pulmonary embolus, asthma, emphysema)     Asthma 8. CAUSE: "What do you think is causing the breathing problem?"  Hx of asthma, COVID in January 9. OTHER SYMPTOMS: "Do you have any other symptoms? (e.g., dizziness, runny nose, cough, chest pain, fever)     No fever, no congestion, no runny nose. Does endorse dry cough.  10. O2 SATURATION MONITOR:  "Do you use an oxygen saturation monitor (pulse oximeter) at home?" If Yes, ask: "What is your reading (oxygen level) today?" "What is your usual oxygen saturation reading?" (e.g., 95%)       Usually 95%, lowest it's been is  92% 12. TRAVEL: "Have you traveled out of the country in the last month?" (e.g., travel history, exposures)       To [place] in January, contracted COVID at that time  Protocols used: Breathing Difficulty-A-AH

## 2023-04-30 ENCOUNTER — Other Ambulatory Visit: Payer: Self-pay

## 2023-04-30 ENCOUNTER — Telehealth: Payer: Self-pay | Admitting: Physician Assistant

## 2023-04-30 MED ORDER — QUETIAPINE FUMARATE 400 MG PO TABS: 800.0000 mg | ORAL_TABLET | Freq: Every day | ORAL | 0 refills | Status: DC

## 2023-04-30 MED ORDER — TRAZODONE HCL 100 MG PO TABS: ORAL_TABLET | ORAL | 0 refills | Status: DC

## 2023-04-30 MED ORDER — LITHIUM CARBONATE 300 MG PO CAPS: 600.0000 mg | ORAL_CAPSULE | Freq: Every day | ORAL | 0 refills | Status: DC

## 2023-04-30 NOTE — Telephone Encounter (Signed)
Pt called and asked for a refill on her lithium 300 mg , trazodone 100 mg and her seroquel 400 mg. Pharmacy is [facility] on [address] Her next appt is 05/07/23

## 2023-04-30 NOTE — Telephone Encounter (Signed)
sent

## 2023-05-02 LAB — LITHIUM LEVEL: Lithium Lvl: 0.8 mmol/L (ref 0.5–1.2)

## 2023-05-04 NOTE — Progress Notes (Signed)
 Lithium level is in range. Continue same dose

## 2023-05-07 ENCOUNTER — Encounter: Payer: Self-pay | Admitting: Physician Assistant

## 2023-05-07 ENCOUNTER — Ambulatory Visit (INDEPENDENT_AMBULATORY_CARE_PROVIDER_SITE_OTHER): Payer: Medicare (Managed Care) | Admitting: Physician Assistant

## 2023-05-07 DIAGNOSIS — F5105 Insomnia due to other mental disorder: Secondary | ICD-10-CM

## 2023-05-07 DIAGNOSIS — F250 Schizoaffective disorder, bipolar type: Secondary | ICD-10-CM | POA: Diagnosis not present

## 2023-05-07 DIAGNOSIS — F411 Generalized anxiety disorder: Secondary | ICD-10-CM

## 2023-05-07 DIAGNOSIS — F99 Mental disorder, not otherwise specified: Secondary | ICD-10-CM

## 2023-05-07 DIAGNOSIS — F4321 Adjustment disorder with depressed mood: Secondary | ICD-10-CM

## 2023-05-07 DIAGNOSIS — Z634 Disappearance and death of family member: Secondary | ICD-10-CM

## 2023-05-07 DIAGNOSIS — E032 Hypothyroidism due to medicaments and other exogenous substances: Secondary | ICD-10-CM

## 2023-05-07 DIAGNOSIS — Z79899 Other long term (current) drug therapy: Secondary | ICD-10-CM

## 2023-05-07 MED ORDER — QUETIAPINE FUMARATE 400 MG PO TABS
800.0000 mg | ORAL_TABLET | Freq: Every day | ORAL | 1 refills | Status: DC
Start: 2023-05-07 — End: 2023-10-07

## 2023-05-07 MED ORDER — TRAZODONE HCL 100 MG PO TABS: ORAL_TABLET | ORAL | 1 refills | Status: DC

## 2023-05-07 MED ORDER — LITHIUM CARBONATE 300 MG PO CAPS: 600.0000 mg | ORAL_CAPSULE | Freq: Every day | ORAL | 1 refills | Status: DC

## 2023-05-07 MED ORDER — ZOLPIDEM TARTRATE 10 MG PO TABS: ORAL_TABLET | ORAL | 5 refills | Status: DC

## 2023-05-07 NOTE — Progress Notes (Signed)
 [facility]  Patient ID: [name],  MRN: [id number]  PCP: Garnette Gunner, MD  Date of Evaluation: 05/07/2023  time spent:30 minutes  Chief Complaint:  Chief Complaint   Depression; Insomnia; Follow-up    HISTORY/CURRENT STATUS: HPI For routine med check.  [name] states she is doing well.  She and her family did okay over the holidays, the first 1 since one of her sons committed suicide.  Her other son is having a hard time with missing him.  But she and her husband have accepted it, she misses him of course but she knew he did not want to get old.  He had been saying things like that to them for several years.  Patient is able to enjoy things.  Energy and motivation are good.  Work is going well.  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  No extreme sadness, tearfulness, or feelings of hopelessness.  Sleeps well with the Ambien.  ADLs and personal hygiene are normal.   Denies any changes in concentration, making decisions, or remembering things.  Appetite has not changed.  Weight is stable.  No complaints of anxiety.  Denies suicidal or homicidal thoughts.  Patient denies increased energy with decreased need for sleep, increased talkativeness, racing thoughts, impulsivity or risky behaviors, increased spending, increased libido, grandiosity, increased irritability or anger, paranoia, or hallucinations.  Review of Systems  Constitutional: Negative.   HENT: Negative.    Eyes: Negative.   Respiratory: Negative.    Cardiovascular: Negative.   Gastrointestinal: Negative.   Genitourinary: Negative.   Musculoskeletal:  Positive for back pain.       Chronic lumbar  Skin: Negative.   Neurological: Negative.   Endo/Heme/Allergies: Negative.   Psychiatric/Behavioral:         See HPI   Individual Medical History/ Review of Systems: Changes? :Yes     she had breast reconstruction surgery since her last visit.  Past medications for  mental health diagnoses include: Depakote, Tegretol, BuSpar, lithium, Seroquel, trazodone, Ambien  Allergies: Patient has no known allergies.  Current Medications:  Current Outpatient Medications:    cyclobenzaprine (FLEXERIL) 10 MG tablet, Take 10 mg by mouth at bedtime as needed for muscle spasms. , Disp: , Rfl:    pregabalin (LYRICA) 100 MG capsule, Take 100 mg by mouth 2 (two) times daily., Disp: , Rfl:    Semaglutide, 2 MG/DOSE, (OZEMPIC, 2 MG/DOSE,) 8 MG/3ML SOPN, Inject 2 mg into the skin once a week., Disp: , Rfl:    traMADol (ULTRAM) 50 MG tablet, Take 100 mg by mouth every 8 (eight) hours as needed., Disp: , Rfl:    lithium carbonate 300 MG capsule, Take 2 capsules (600 mg total) by mouth at bedtime., Disp: 180 capsule, Rfl: 1   QUEtiapine (SEROQUEL) 400 MG tablet, Take 2 tablets (800 mg total) by mouth at bedtime., Disp: 180 tablet, Rfl: 1   traZODone (DESYREL) 100 MG tablet, TAKE ONE TO TWO TABLETS BY MOUTH AT BEDTIME AS NEEDED FOR SLEEP, Disp: 180 tablet, Rfl: 1   zolpidem (AMBIEN) 10 MG tablet, TAKE ONE TABLET BY MOUTH AT BEDTIME AS NEEDED FOR SLEEP, Disp: 30 tablet, Rfl: 5 Medication Side Effects: none  Family Medical/ Social History: Changes?  Plans to move to [place] in August  MENTAL HEALTH EXAM:  There were no vitals taken for this visit.There is no height or weight on file to calculate BMI.  General Appearance: Casual, Neat, Well Groomed and  Obese  Eye Contact:  Good  Speech:  Clear and Coherent and Normal Rate  Volume:  Normal  Mood:  Euthymic  Affect:  Congruent  Thought Process:  Goal Directed and Descriptions of Associations: Intact  Orientation:  Full (Time, Place, and Person)  Thought Content: Logical   Suicidal Thoughts:  No  Homicidal Thoughts:  No  Memory:  WNL  Judgement:  Good  Insight:  Good  Psychomotor Activity:  Normal  Concentration:  Concentration: Good and Attention Span: Good  Recall:  Good  Fund of Knowledge: Good  Language: Good   Assets:  Desire for Improvement Financial Resources/Insurance Housing Transportation Vocational/Educational  ADL's:  Intact  Cognition: WNL  Prognosis:  Good   Labs followed by PCP and endocrinology 05/01/2023 Lithium level 0.8  DIAGNOSES:    ICD-10-CM   1. Schizoaffective disorder, bipolar type (HCC)  F25.0 CBC with Differential/Platelet    Comprehensive metabolic panel    Hemoglobin A1c    Lipid panel    TSH    Lithium level    2. Generalized anxiety disorder  F41.1     3. Insomnia due to other mental disorder  F51.05    F99     4. 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 Grief at loss of child  F43.21    Z63.4     6. Encounter for long-term (current) use of medications  Z79.899 CBC with Differential/Platelet    Comprehensive metabolic panel    Hemoglobin A1c    Lipid panel    TSH    Lithium level     Receiving Psychotherapy: No    RECOMMENDATIONS:  PDMP was reviewed.  Ambien filled 04/04/2023.  Also on tramadol. I provided 30 minutes of face to face time during this encounter, including time spent before and after the visit in records review, medical decision making, counseling pertinent to today's visit, and charting.   She is doing well with current medications so no changes will be made.  Continue lithium 300 mg, 2 p.o. nightly. Continue Seroquel 400 mg, 2 p.o. nightly. Continue trazodone 100 mg, 1-2 p.o. nightly as needed. Continue Ambien 10 mg 1 nightly as needed sleep. Labs ordered as above to be done in June or July. Return in 4-5 months.  Melony Overly, PA-C

## 2023-07-17 DIAGNOSIS — Z79899 Other long term (current) drug therapy: Secondary | ICD-10-CM | POA: Diagnosis not present

## 2023-08-21 ENCOUNTER — Telehealth: Payer: Self-pay | Admitting: Physician Assistant

## 2023-08-21 NOTE — Telephone Encounter (Signed)
 Pt has RF available on all medications. She was notified.

## 2023-08-21 NOTE — Telephone Encounter (Signed)
 [name] called at 9 am today requesting early refills on her Seroquel ,Trazodone , and Lithium . She stated she is going out of town on Monday.  Appointment 10/07/23

## 2023-10-01 DIAGNOSIS — Z1509 Genetic susceptibility to other malignant neoplasm: Secondary | ICD-10-CM | POA: Diagnosis not present

## 2023-10-01 DIAGNOSIS — K219 Gastro-esophageal reflux disease without esophagitis: Secondary | ICD-10-CM | POA: Diagnosis not present

## 2023-10-01 DIAGNOSIS — Z79899 Other long term (current) drug therapy: Secondary | ICD-10-CM | POA: Diagnosis not present

## 2023-10-01 DIAGNOSIS — Z45811 Encounter for adjustment or removal of right breast implant: Secondary | ICD-10-CM | POA: Diagnosis not present

## 2023-10-01 DIAGNOSIS — Z1501 Genetic susceptibility to malignant neoplasm of breast: Secondary | ICD-10-CM | POA: Diagnosis not present

## 2023-10-01 DIAGNOSIS — J4520 Mild intermittent asthma, uncomplicated: Secondary | ICD-10-CM | POA: Diagnosis not present

## 2023-10-01 DIAGNOSIS — E039 Hypothyroidism, unspecified: Secondary | ICD-10-CM | POA: Diagnosis not present

## 2023-10-06 ENCOUNTER — Encounter: Payer: Self-pay | Admitting: Physician Assistant

## 2023-10-06 ENCOUNTER — Ambulatory Visit: Payer: Self-pay | Admitting: Physician Assistant

## 2023-10-06 NOTE — Progress Notes (Signed)
 Holding for official [employer] documents.  No red flags except TSH abnormal at 6.4.  I will address this once the individual lab reports are available.  Lithium  level 0.7

## 2023-10-07 ENCOUNTER — Ambulatory Visit: Payer: Self-pay | Admitting: Physician Assistant

## 2023-10-07 ENCOUNTER — Ambulatory Visit (INDEPENDENT_AMBULATORY_CARE_PROVIDER_SITE_OTHER): Payer: Medicare (Managed Care) | Admitting: Physician Assistant

## 2023-10-07 ENCOUNTER — Encounter: Payer: Self-pay | Admitting: Physician Assistant

## 2023-10-07 DIAGNOSIS — F99 Mental disorder, not otherwise specified: Secondary | ICD-10-CM

## 2023-10-07 DIAGNOSIS — F5105 Insomnia due to other mental disorder: Secondary | ICD-10-CM

## 2023-10-07 DIAGNOSIS — F411 Generalized anxiety disorder: Secondary | ICD-10-CM

## 2023-10-07 DIAGNOSIS — E032 Hypothyroidism due to medicaments and other exogenous substances: Secondary | ICD-10-CM

## 2023-10-07 DIAGNOSIS — F250 Schizoaffective disorder, bipolar type: Secondary | ICD-10-CM

## 2023-10-07 DIAGNOSIS — Z79899 Other long term (current) drug therapy: Secondary | ICD-10-CM

## 2023-10-07 LAB — COMPREHENSIVE METABOLIC PANEL WITH GFR
ALT: 21 IU/L (ref 0–32)
AST: 19 IU/L (ref 0–40)
Albumin: 4.5 g/dL (ref 3.8–4.9)
Alkaline Phosphatase: 84 IU/L (ref 44–121)
BUN/Creatinine Ratio: 18 (ref 9–23)
BUN: 19 mg/dL (ref 6–24)
Bilirubin Total: <0.2 mg/dL (ref 0.0–1.2)
CO2: 19 mmol/L — ABNORMAL LOW (ref 20–29)
Calcium: 9.5 mg/dL (ref 8.7–10.2)
Chloride: 102 mmol/L (ref 96–106)
Creatinine, Ser: 1.05 mg/dL — ABNORMAL HIGH (ref 0.57–1.00)
Globulin, Total: 2.1 g/dL (ref 1.5–4.5)
Glucose: 94 mg/dL (ref 70–99)
Potassium: 4.5 mmol/L (ref 3.5–5.2)
Sodium: 144 mmol/L (ref 134–144)
Total Protein: 6.6 g/dL (ref 6.0–8.5)
eGFR: 62 mL/min/1.73 (ref >59)

## 2023-10-07 LAB — CBC WITH DIFFERENTIAL/PLATELET
Basophils Absolute: 0 x10E3/uL (ref 0.0–0.2)
Basos: 0 %
EOS (ABSOLUTE): 0 x10E3/uL (ref 0.0–0.4)
Eos: 0 %
Hematocrit: 37.5 % (ref 34.0–46.6)
Hemoglobin: 12.1 g/dL (ref 11.1–15.9)
Immature Grans (Abs): 0 x10E3/uL (ref 0.0–0.1)
Immature Granulocytes: 0 %
Lymphocytes Absolute: 1.5 x10E3/uL (ref 0.7–3.1)
Lymphs: 33 %
MCH: 29.8 pg (ref 26.6–33.0)
MCHC: 32.3 g/dL (ref 31.5–35.7)
MCV: 92 fL (ref 79–97)
Monocytes Absolute: 0.3 x10E3/uL (ref 0.1–0.9)
Monocytes: 7 %
Neutrophils Absolute: 2.7 x10E3/uL (ref 1.4–7.0)
Neutrophils: 60 %
Platelets: 158 x10E3/uL (ref 150–450)
RBC: 4.06 x10E6/uL (ref 3.77–5.28)
RDW: 12.5 % (ref 11.7–15.4)
WBC: 4.5 x10E3/uL (ref 3.4–10.8)

## 2023-10-07 LAB — TSH: TSH: 6.42 u[IU]/mL — ABNORMAL HIGH (ref 0.450–4.500)

## 2023-10-07 LAB — LITHIUM LEVEL: Lithium Lvl: 0.7 mmol/L (ref 0.5–1.2)

## 2023-10-07 LAB — LIPID PANEL
Chol/HDL Ratio: 3.6 ratio (ref 0.0–4.4)
Cholesterol, Total: 210 mg/dL — ABNORMAL HIGH (ref 100–199)
HDL: 59 mg/dL (ref >39)
LDL Chol Calc (NIH): 126 mg/dL — ABNORMAL HIGH (ref 0–99)
Triglycerides: 139 mg/dL (ref 0–149)
VLDL Cholesterol Cal: 25 mg/dL (ref 5–40)

## 2023-10-07 LAB — HEMOGLOBIN A1C
Est. average glucose Bld gHb Est-mCnc: 97 mg/dL
Hgb A1c MFr Bld: 5 % (ref 4.8–5.6)

## 2023-10-07 MED ORDER — ZOLPIDEM TARTRATE 10 MG PO TABS: ORAL_TABLET | ORAL | 5 refills | Status: DC

## 2023-10-07 MED ORDER — QUETIAPINE FUMARATE 400 MG PO TABS: 800.0000 mg | ORAL_TABLET | Freq: Every day | ORAL | 1 refills | Status: DC

## 2023-10-07 MED ORDER — TRAZODONE HCL 100 MG PO TABS: ORAL_TABLET | ORAL | 1 refills | Status: DC

## 2023-10-07 MED ORDER — LITHIUM CARBONATE 300 MG PO CAPS: 600.0000 mg | ORAL_CAPSULE | Freq: Every day | ORAL | 1 refills | Status: DC

## 2023-10-07 NOTE — Progress Notes (Signed)
 [facility]  Patient ID: [name],  MRN: [id number]  PCP: Sebastian Beverley NOVAK, MD  Date of Evaluation: 10/07/2023  Time spent:35 minutes  Chief Complaint:  Chief Complaint   Follow-up    HISTORY/CURRENT STATUS: HPI For routine med check.  Doing really well. Meds are still effective. Patient is able to enjoy things.  Energy and motivation are good.  No extreme sadness, tearfulness, or feelings of hopelessness.  Sleeps well most of the time. ADLs and personal hygiene are normal.   Denies any changes in concentration, making decisions, or remembering things.  Appetite has not changed.  Weight is stable.  No SI/HI.  Patient denies increased energy with decreased need for sleep, increased talkativeness, racing thoughts, impulsivity or risky behaviors, increased spending, increased libido, grandiosity, increased irritability or anger, paranoia, or hallucinations.  Denies feeling nervous and/or restless. No sense of impending doom. Is able to control worry.  Denies tendency to avoid of things that may trigger anxiety. No tachycardia, palpitations, SOB, sweating, or trembling.   Denies increased fatigue, wt gain, hair loss. Denies dizziness, syncope, seizures, numbness, tingling, tremor, tics, unsteady gait, slurred speech, confusion. Denies muscle or joint pain, stiffness, or dystonia. Denies unexplained weight loss, frequent infections, or sores that heal slowly.  No polyphagia, polydipsia, or polyuria. Denies visual changes or paresthesias.   Individual Medical History/ Review of Systems: Changes? :No       Past medications for mental health diagnoses include: Depakote, Tegretol, BuSpar , lithium , Seroquel , trazodone , Ambien   Allergies: Patient has no known allergies.  Current Medications:  Current Outpatient Medications:    cyclobenzaprine  (FLEXERIL ) 10 MG tablet, Take 10 mg by mouth at bedtime as needed for muscle spasms. , Disp: , Rfl:    pregabalin  (LYRICA ) 100 MG  capsule, Take 100 mg by mouth 2 (two) times daily., Disp: , Rfl:    traMADol  (ULTRAM ) 50 MG tablet, Take 100 mg by mouth every 8 (eight) hours as needed., Disp: , Rfl:    lithium  carbonate 300 MG capsule, Take 2 capsules (600 mg total) by mouth at bedtime., Disp: 180 capsule, Rfl: 1   QUEtiapine  (SEROQUEL ) 400 MG tablet, Take 2 tablets (800 mg total) by mouth at bedtime., Disp: 180 tablet, Rfl: 1   Semaglutide, 2 MG/DOSE, (OZEMPIC, 2 MG/DOSE,) 8 MG/3ML SOPN, Inject 2 mg into the skin once a week., Disp: , Rfl:    traZODone  (DESYREL ) 100 MG tablet, TAKE ONE TO TWO TABLETS BY MOUTH AT BEDTIME AS NEEDED FOR SLEEP, Disp: 180 tablet, Rfl: 1   zolpidem  (AMBIEN ) 10 MG tablet, TAKE ONE TABLET BY MOUTH AT BEDTIME AS NEEDED FOR SLEEP, Disp: 30 tablet, Rfl: 5 Medication Side Effects: none  Family Medical/ Social History: Changes?  Moving to [place] in August, will be returning to [facility] every 3 months for dr's appts.   MENTAL HEALTH EXAM:  There were no vitals taken for this visit.There is no height or weight on file to calculate BMI.  General Appearance: Casual, Neat, Well Groomed and Obese  Eye Contact:  Good  Speech:  Clear and Coherent and Normal Rate  Volume:  Normal  Mood:  Euthymic  Affect:  Congruent  Thought Process:  Goal Directed and Descriptions of Associations: Intact  Orientation:  Full (Time, Place, and Person)  Thought Content: Logical   Suicidal Thoughts:  No  Homicidal Thoughts:  No  Memory:  WNL  Judgement:  Good  Insight:  Good  Psychomotor Activity:  Normal  Concentration:  Concentration: Good and Attention  Span: Good  Recall:  Good  Fund of Knowledge: Good  Language: Good  Assets:  Communication Skills Desire for Improvement Financial Resources/Insurance Housing Transportation Vocational/Educational  ADL's:  Intact  Cognition: WNL  Prognosis:  Good   Labs also followed by PCP and endocrinology   10/05/2023 CBC with differential completely normal CMP creatinine  1.05, CO2 19, otherwise completely normal Hemoglobin A1c 5.0 Total cholesterol 210, LDL 126, otherwise normal TSH 6.4 Lithium  level 0.7  DIAGNOSES:    ICD-10-CM   1. Schizoaffective disorder, bipolar type (HCC)  F25.0 CBC with Differential/Platelet    Comprehensive metabolic panel    Lipid panel    Hemoglobin A1c    TSH    Lithium  level    2. Generalized anxiety disorder  F41.1     3. 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 Insomnia due to other mental disorder  F51.05    F99     5. Encounter for long-term (current) use of medications  Z79.899 CBC with Differential/Platelet    Comprehensive metabolic panel    Lipid panel    Hemoglobin A1c    TSH    Lithium  level      Receiving Psychotherapy: No    RECOMMENDATIONS:  PDMP was reviewed.  Ambien  filled 10/02/2023.  Oxycodone  prescribed 10/03/2023.  Also on tramadol . I provided approximately 35 minutes of face to face time during this encounter, including time spent before and after the visit in records review, medical decision making, counseling pertinent to today's visit, and charting.   She requests a letter for her dog to fly with her.  Letter dictated.   Labs reviewed with her.  The TSH is high, but not extremely so.  She saw endocrinologist in the past (unsure when but thinks it's been recent) who said she doesn't need tx for the elevated TSH. Will follow.  Lithium  level is good so no changes there.   Continue lithium  300 mg, 2 p.o. nightly. Continue Seroquel  400 mg, 2 p.o. nightly. Continue trazodone  100 mg, 1-2 p.o. nightly as needed. Continue Ambien  10 mg 1 nightly as needed sleep. Return in 6 months.   Verneita Cooks, PA-C

## 2023-10-07 NOTE — Progress Notes (Signed)
 Patient was given results at appointment on 10/07/2023

## 2023-10-13 DIAGNOSIS — Z0289 Encounter for other administrative examinations: Secondary | ICD-10-CM

## 2023-10-15 ENCOUNTER — Telehealth: Payer: Self-pay | Admitting: Physician Assistant

## 2023-10-15 NOTE — Telephone Encounter (Signed)
 Lvm to advise letter ready for pick up at front desk.

## 2023-11-04 ENCOUNTER — Other Ambulatory Visit: Payer: Self-pay

## 2023-11-04 ENCOUNTER — Telehealth: Payer: Self-pay | Admitting: Physician Assistant

## 2023-11-04 DIAGNOSIS — F5105 Insomnia due to other mental disorder: Secondary | ICD-10-CM

## 2023-11-04 MED ORDER — LITHIUM CARBONATE 300 MG PO CAPS: 600.0000 mg | ORAL_CAPSULE | Freq: Every day | ORAL | 1 refills | Status: DC

## 2023-11-04 MED ORDER — TRAZODONE HCL 100 MG PO TABS: ORAL_TABLET | ORAL | 1 refills | Status: DC

## 2023-11-04 MED ORDER — ZOLPIDEM TARTRATE 10 MG PO TABS
ORAL_TABLET | ORAL | 1 refills | Status: AC
Start: 2023-11-04 — End: ?

## 2023-11-04 MED ORDER — QUETIAPINE FUMARATE 400 MG PO TABS: 800.0000 mg | ORAL_TABLET | Freq: Every day | ORAL | 1 refills | Status: DC

## 2023-11-04 NOTE — Telephone Encounter (Signed)
 Pended Rx for zolpidem  to [facility] and sent all other RF.

## 2023-11-04 NOTE — Telephone Encounter (Signed)
 Pt called and said that she wants top cancel all of [facility] scripts at [facility]. She wants all of her scripts sent to [facility] [address] [place] [address]. She never picked up any of the meds at [facility] that teresa sent

## 2023-11-04 NOTE — Telephone Encounter (Signed)
 Canceled all scripts at [facility].

## 2024-01-10 ENCOUNTER — Other Ambulatory Visit: Payer: Self-pay | Admitting: Physician Assistant

## 2024-05-03 ENCOUNTER — Ambulatory Visit (INDEPENDENT_AMBULATORY_CARE_PROVIDER_SITE_OTHER): Payer: Medicare (Managed Care) | Admitting: Physician Assistant
